# Patient Record
Sex: Male | Born: 1970
Health system: Southern US, Community
[De-identification: ages and names within clinical notes are randomized; demographics above are authoritative.]

## PROBLEM LIST (undated history)

## (undated) DIAGNOSIS — C9000 Multiple myeloma not having achieved remission: Secondary | ICD-10-CM

## (undated) DIAGNOSIS — N183 Chronic kidney disease, stage 3 unspecified: Secondary | ICD-10-CM

## (undated) DIAGNOSIS — J189 Pneumonia, unspecified organism: Secondary | ICD-10-CM

## (undated) DIAGNOSIS — I10 Essential (primary) hypertension: Secondary | ICD-10-CM

## (undated) DIAGNOSIS — G629 Polyneuropathy, unspecified: Secondary | ICD-10-CM

## (undated) HISTORY — DX: Polyneuropathy, unspecified: G62.9

## (undated) HISTORY — PX: JOINT REPLACEMENT: SHX530

## (undated) HISTORY — DX: Chronic kidney disease, stage 3 (moderate): N18.3

## (undated) HISTORY — PX: KNEE SURGERY: SHX244

## (undated) HISTORY — DX: Chronic kidney disease, stage 3 unspecified: N18.30

## (undated) HISTORY — DX: Pneumonia, unspecified organism: J18.9

## (undated) HISTORY — PX: ABDOMINAL SURGERY: SHX537

## (undated) MED FILL — Dexamethasone Sodium Phosphate Inj 100 MG/10ML: INTRAMUSCULAR | Qty: 2 | Status: AC

---

## 1997-09-13 ENCOUNTER — Encounter: Admission: RE | Admit: 1997-09-13 | Discharge: 1997-09-13 | Payer: Self-pay | Admitting: Internal Medicine

## 1997-12-06 ENCOUNTER — Encounter: Admission: RE | Admit: 1997-12-06 | Discharge: 1997-12-06 | Payer: Self-pay | Admitting: Internal Medicine

## 1997-12-20 ENCOUNTER — Encounter: Admission: RE | Admit: 1997-12-20 | Discharge: 1997-12-20 | Payer: Self-pay | Admitting: Internal Medicine

## 1997-12-27 ENCOUNTER — Ambulatory Visit (HOSPITAL_COMMUNITY): Admission: RE | Admit: 1997-12-27 | Discharge: 1997-12-27 | Payer: Self-pay | Admitting: *Deleted

## 1998-03-03 ENCOUNTER — Encounter: Admission: RE | Admit: 1998-03-03 | Discharge: 1998-03-03 | Payer: Self-pay | Admitting: Internal Medicine

## 1998-03-14 ENCOUNTER — Encounter: Admission: RE | Admit: 1998-03-14 | Discharge: 1998-04-11 | Payer: Self-pay

## 1998-03-22 ENCOUNTER — Ambulatory Visit (HOSPITAL_COMMUNITY): Admission: RE | Admit: 1998-03-22 | Discharge: 1998-03-22 | Payer: Self-pay | Admitting: Internal Medicine

## 1998-03-22 ENCOUNTER — Encounter: Admission: RE | Admit: 1998-03-22 | Discharge: 1998-03-22 | Payer: Self-pay | Admitting: Internal Medicine

## 2002-08-02 ENCOUNTER — Emergency Department (HOSPITAL_COMMUNITY): Admission: EM | Admit: 2002-08-02 | Discharge: 2002-08-02 | Payer: Self-pay | Admitting: Emergency Medicine

## 2003-04-28 ENCOUNTER — Other Ambulatory Visit: Payer: Self-pay

## 2004-05-09 ENCOUNTER — Emergency Department: Payer: Self-pay | Admitting: Unknown Physician Specialty

## 2004-05-10 ENCOUNTER — Ambulatory Visit: Payer: Self-pay | Admitting: Unknown Physician Specialty

## 2004-05-11 ENCOUNTER — Ambulatory Visit: Payer: Self-pay | Admitting: Internal Medicine

## 2004-05-31 ENCOUNTER — Ambulatory Visit: Payer: Self-pay | Admitting: Internal Medicine

## 2006-02-18 ENCOUNTER — Emergency Department: Payer: Self-pay | Admitting: Emergency Medicine

## 2006-09-18 ENCOUNTER — Other Ambulatory Visit: Payer: Self-pay

## 2006-09-18 ENCOUNTER — Emergency Department: Payer: Self-pay | Admitting: General Practice

## 2006-12-16 ENCOUNTER — Emergency Department: Payer: Self-pay | Admitting: Emergency Medicine

## 2006-12-18 ENCOUNTER — Emergency Department: Payer: Self-pay | Admitting: Emergency Medicine

## 2007-02-24 ENCOUNTER — Emergency Department: Payer: Self-pay | Admitting: Emergency Medicine

## 2007-03-04 ENCOUNTER — Emergency Department: Payer: Self-pay | Admitting: Internal Medicine

## 2007-10-12 ENCOUNTER — Emergency Department: Payer: Self-pay | Admitting: Emergency Medicine

## 2007-10-12 ENCOUNTER — Other Ambulatory Visit: Payer: Self-pay

## 2009-01-05 ENCOUNTER — Inpatient Hospital Stay: Payer: Self-pay | Admitting: Internal Medicine

## 2011-06-28 ENCOUNTER — Encounter: Payer: Self-pay | Admitting: Orthopedic Surgery

## 2011-07-22 ENCOUNTER — Encounter: Payer: Self-pay | Admitting: Orthopedic Surgery

## 2011-07-31 DIAGNOSIS — M8700 Idiopathic aseptic necrosis of unspecified bone: Secondary | ICD-10-CM | POA: Insufficient documentation

## 2011-07-31 DIAGNOSIS — Z9889 Other specified postprocedural states: Secondary | ICD-10-CM | POA: Insufficient documentation

## 2015-05-12 ENCOUNTER — Emergency Department (HOSPITAL_COMMUNITY): Payer: BLUE CROSS/BLUE SHIELD

## 2015-05-12 ENCOUNTER — Encounter (HOSPITAL_COMMUNITY): Payer: Self-pay | Admitting: Emergency Medicine

## 2015-05-12 ENCOUNTER — Emergency Department (HOSPITAL_COMMUNITY)
Admission: EM | Admit: 2015-05-12 | Discharge: 2015-05-12 | Disposition: A | Discharge status: Home / Self Care | Payer: BLUE CROSS/BLUE SHIELD | Attending: Physician Assistant | Admitting: Physician Assistant

## 2015-05-12 DIAGNOSIS — T148 Other injury of unspecified body region: Secondary | ICD-10-CM | POA: Diagnosis not present

## 2015-05-12 DIAGNOSIS — S199XXA Unspecified injury of neck, initial encounter: Secondary | ICD-10-CM | POA: Diagnosis present

## 2015-05-12 DIAGNOSIS — Y998 Other external cause status: Secondary | ICD-10-CM | POA: Insufficient documentation

## 2015-05-12 DIAGNOSIS — T148XXA Other injury of unspecified body region, initial encounter: Secondary | ICD-10-CM

## 2015-05-12 DIAGNOSIS — Y9389 Activity, other specified: Secondary | ICD-10-CM | POA: Insufficient documentation

## 2015-05-12 DIAGNOSIS — Y92410 Unspecified street and highway as the place of occurrence of the external cause: Secondary | ICD-10-CM | POA: Diagnosis not present

## 2015-05-12 DIAGNOSIS — I10 Essential (primary) hypertension: Secondary | ICD-10-CM | POA: Insufficient documentation

## 2015-05-12 DIAGNOSIS — S8992XA Unspecified injury of left lower leg, initial encounter: Secondary | ICD-10-CM | POA: Diagnosis not present

## 2015-05-12 HISTORY — DX: Essential (primary) hypertension: I10

## 2015-05-12 LAB — CBC WITH DIFFERENTIAL/PLATELET
BASOS ABS: 0 10*3/uL (ref 0.0–0.1)
Basophils Relative: 0 %
Eosinophils Absolute: 0.1 10*3/uL (ref 0.0–0.7)
Eosinophils Relative: 1 %
HEMATOCRIT: 41.9 % (ref 39.0–52.0)
Hemoglobin: 15.2 g/dL (ref 13.0–17.0)
LYMPHS PCT: 13 %
Lymphs Abs: 1.1 10*3/uL (ref 0.7–4.0)
MCH: 32.5 pg (ref 26.0–34.0)
MCHC: 36.3 g/dL — ABNORMAL HIGH (ref 30.0–36.0)
MCV: 89.7 fL (ref 78.0–100.0)
MONO ABS: 0.9 10*3/uL (ref 0.1–1.0)
MONOS PCT: 10 %
NEUTROS ABS: 6.8 10*3/uL (ref 1.7–7.7)
Neutrophils Relative %: 76 %
Platelets: 234 10*3/uL (ref 150–400)
RBC: 4.67 MIL/uL (ref 4.22–5.81)
RDW: 13.3 % (ref 11.5–15.5)
WBC: 8.9 10*3/uL (ref 4.0–10.5)

## 2015-05-12 LAB — COMPREHENSIVE METABOLIC PANEL
ALBUMIN: 4.3 g/dL (ref 3.5–5.0)
ALT: 28 U/L (ref 17–63)
ANION GAP: 12 (ref 5–15)
AST: 35 U/L (ref 15–41)
Alkaline Phosphatase: 72 U/L (ref 38–126)
BILIRUBIN TOTAL: 0.7 mg/dL (ref 0.3–1.2)
BUN: 10 mg/dL (ref 6–20)
CHLORIDE: 103 mmol/L (ref 101–111)
CO2: 24 mmol/L (ref 22–32)
Calcium: 9.7 mg/dL (ref 8.9–10.3)
Creatinine, Ser: 1.06 mg/dL (ref 0.61–1.24)
GFR calc Af Amer: >60 mL/min (ref >60)
GFR calc non Af Amer: >60 mL/min (ref >60)
GLUCOSE: 93 mg/dL (ref 65–99)
POTASSIUM: 3.2 mmol/L — AB (ref 3.5–5.1)
SODIUM: 139 mmol/L (ref 135–145)
TOTAL PROTEIN: 6.4 g/dL — AB (ref 6.5–8.1)

## 2015-05-12 MED ORDER — HYDROCODONE-ACETAMINOPHEN 5-325 MG PO TABS: 1.0000 | ORAL_TABLET | ORAL | Status: DC | PRN

## 2015-05-12 MED ORDER — FENTANYL CITRATE (PF) 100 MCG/2ML IJ SOLN
50.0000 ug | Freq: Once | INTRAMUSCULAR | Status: AC
  Administered 2015-05-12: 50 ug via INTRAVENOUS
  Filled 2015-05-12: qty 2

## 2015-05-12 MED ORDER — IOHEXOL 350 MG/ML SOLN
50.0000 mL | Freq: Once | INTRAVENOUS | Status: AC | PRN
  Administered 2015-05-12: 50 mL via INTRAVENOUS
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
## 2015-05-12 NOTE — Discharge Instructions (Signed)

## 2015-08-03 ENCOUNTER — Emergency Department
Admission: EM | Admit: 2015-08-03 | Discharge: 2015-08-03 | Disposition: A | Discharge status: Home / Self Care | Payer: BLUE CROSS/BLUE SHIELD | Attending: Emergency Medicine | Admitting: Emergency Medicine

## 2015-08-03 DIAGNOSIS — Z79899 Other long term (current) drug therapy: Secondary | ICD-10-CM | POA: Diagnosis not present

## 2015-08-03 DIAGNOSIS — I10 Essential (primary) hypertension: Secondary | ICD-10-CM | POA: Insufficient documentation

## 2015-08-03 DIAGNOSIS — J069 Acute upper respiratory infection, unspecified: Secondary | ICD-10-CM | POA: Diagnosis not present

## 2015-08-03 DIAGNOSIS — R509 Fever, unspecified: Secondary | ICD-10-CM | POA: Diagnosis present

## 2015-08-03 DIAGNOSIS — J209 Acute bronchitis, unspecified: Secondary | ICD-10-CM | POA: Diagnosis not present

## 2015-08-03 LAB — RAPID INFLUENZA A&B ANTIGENS (ARMC ONLY): INFLUENZA B (ARMC): NEGATIVE

## 2015-08-03 LAB — RAPID INFLUENZA A&B ANTIGENS: Influenza A (ARMC): NEGATIVE

## 2015-08-03 MED ORDER — AZITHROMYCIN 250 MG PO TABS: ORAL_TABLET | ORAL | Status: DC

## 2015-08-03 MED ORDER — BENZONATATE 100 MG PO CAPS: 100.0000 mg | ORAL_CAPSULE | Freq: Three times a day (TID) | ORAL | Status: DC | PRN

## 2015-08-03 MED ORDER — ALBUTEROL SULFATE HFA 108 (90 BASE) MCG/ACT IN AERS: 2.0000 | INHALATION_SPRAY | Freq: Four times a day (QID) | RESPIRATORY_TRACT | Status: DC | PRN

## 2015-08-03 NOTE — ED Notes (Signed)
Pt c/o sore throat, cough congestion, fever chills for the past 4 days

## 2015-08-03 NOTE — Discharge Instructions (Signed)
Acute Bronchitis Bronchitis is inflammation of the airways that extend from the windpipe into the lungs (bronchi). The inflammation often causes mucus to develop. This leads to a cough, which is the most common symptom of bronchitis.  In acute bronchitis, the condition usually develops suddenly and goes away over time, usually in a couple weeks. Smoking, allergies, and asthma can make bronchitis worse. Repeated episodes of bronchitis may cause further lung problems.  CAUSES Acute bronchitis is most often caused by the same virus that causes a cold. The virus can spread from person to person (contagious) through coughing, sneezing, and touching contaminated objects. SIGNS AND SYMPTOMS   Cough.   Fever.   Coughing up mucus.   Body aches.   Chest congestion.   Chills.   Shortness of breath.   Sore throat.  DIAGNOSIS  Acute bronchitis is usually diagnosed through a physical exam. Your health care provider will also ask you questions about your medical history. Tests, such as chest X-rays, are sometimes done to rule out other conditions.  TREATMENT  Acute bronchitis usually goes away in a couple weeks. Oftentimes, no medical treatment is necessary. Medicines are sometimes given for relief of fever or cough. Antibiotic medicines are usually not needed but may be prescribed in certain situations. In some cases, an inhaler may be recommended to help reduce shortness of breath and control the cough. A cool mist vaporizer may also be used to help thin bronchial secretions and make it easier to clear the chest.  HOME CARE INSTRUCTIONS  Get plenty of rest.   Drink enough fluids to keep your urine clear or pale yellow (unless you have a medical condition that requires fluid restriction). Increasing fluids may help thin your respiratory secretions (sputum) and reduce chest congestion, and it will prevent dehydration.   Take medicines only as directed by your health care provider.  If  you were prescribed an antibiotic medicine, finish it all even if you start to feel better.  Avoid smoking and secondhand smoke. Exposure to cigarette smoke or irritating chemicals will make bronchitis worse. If you are a smoker, consider using nicotine gum or skin patches to help control withdrawal symptoms. Quitting smoking will help your lungs heal faster.   Reduce the chances of another bout of acute bronchitis by washing your hands frequently, avoiding people with cold symptoms, and trying not to touch your hands to your mouth, nose, or eyes.   Keep all follow-up visits as directed by your health care provider.  SEEK MEDICAL CARE IF: Your symptoms do not improve after 1 week of treatment.  SEEK IMMEDIATE MEDICAL CARE IF:  You develop an increased fever or chills.   You have chest pain.   You have severe shortness of breath.  You have bloody sputum.   You develop dehydration.  You faint or repeatedly feel like you are going to pass out.  You develop repeated vomiting.  You develop a severe headache. MAKE SURE YOU:   Understand these instructions.  Will watch your condition.  Will get help right away if you are not doing well or get worse.   This information is not intended to replace advice given to you by your health care provider. Make sure you discuss any questions you have with your health care provider.   Document Released: 05/16/2004 Document Revised: 04/29/2014 Document Reviewed: 09/29/2012 Elsevier Interactive Patient Education 2016 [place] are being treated for a bacterial infection of the chest. You should take the antibiotic as directed  until completed. Use the cough medicine and inhaler for cough and congestion relief. You may continue to dose your Nyquil as needed. Follow-up with [facility] or your primary provider for continued symptoms. Return to the ED as needed for shortness of breath or chest pain.

## 2015-08-03 NOTE — ED Provider Notes (Signed)
[facility] Emergency Department Provider Note ____________________________________________  Time seen: 1821  I have reviewed the triage vital signs and the nursing notes.  HISTORY  Chief Complaint  URI  HPI James Gean. is a 45 y.o. male presents to the ED with complaints of fever for the last 2 days. He also notes generalized body aches, sore throat, and headache. He reports a productive cough at that time as well. He reports a Tmax of 104 over the last 2 days. He did not receive the seasonal flu vaccine. The patient denies any nausea, vomiting, or diarrhea. Has been dosing NyQuil until and Tylenol with intermittent benefit.He rates his overall discomfort at a 7/10 in triage.  Past Medical History  Diagnosis Date  . Hypertension     There are no active problems to display for this patient.   Past Surgical History  Procedure Laterality Date  . Joint replacement      right knee    Current Outpatient Rx  Name  Route  Sig  Dispense  Refill  . albuterol (PROVENTIL HFA;VENTOLIN HFA) 108 (90 Base) MCG/ACT inhaler   Inhalation   Inhale 2 puffs into the lungs every 6 (six) hours as needed for wheezing or shortness of breath.   1 Inhaler   0   . azithromycin (ZITHROMAX Z-PAK) 250 MG tablet      Take 2 tablets (500 mg) on  Day 1,  followed by 1 tablet (250 mg) once daily on Days 2 through 5.   6 each   0   . benzonatate (TESSALON PERLES) 100 MG capsule   Oral   Take 1 capsule (100 mg total) by mouth 3 (three) times daily as needed for cough (Take 1-2 per dose).   30 capsule   0   . HYDROcodone-acetaminophen (NORCO/VICODIN) 5-325 MG tablet   Oral   Take 1 tablet by mouth every 4 (four) hours as needed.   10 tablet   0    Allergies Review of patient's allergies indicates no known allergies.  No family history on file.  Social History Social History  Substance Use Topics  . Smoking status: Never Smoker   . Smokeless tobacco: None  .  Alcohol Use: No   Review of Systems  Constitutional: Positive for fever and body aches. Eyes: Negative for visual changes. ENT: Negative for sore throat. Cardiovascular: Negative for chest pain. Respiratory: Negative for shortness of breath. Positive for productive cough as above. Gastrointestinal: Negative for abdominal pain, vomiting and diarrhea. Genitourinary: Negative for dysuria. Musculoskeletal: Negative for back pain. Skin: Negative for rash. Neurological: Negative for headaches, focal weakness or numbness. ____________________________________________  PHYSICAL EXAM:  VITAL SIGNS: ED Triage Vitals  Enc Vitals Group     BP 08/03/15 1801 129/95 mmHg     Pulse Rate 08/03/15 1801 117     Resp 08/03/15 1801 18     Temp 08/03/15 1801 99.1 F (37.3 C)     Temp Source 08/03/15 1801 Oral     SpO2 08/03/15 1801 99 %     Weight 08/03/15 1801 140 lb (63.504 kg)     Height 08/03/15 1801 5\' 10"  (1.778 m)     Head Cir --      Peak Flow --      Pain Score 08/03/15 1801 7     Pain Loc --      Pain Edu? --      Excl. in [place]? --    Constitutional: Alert and oriented.  Well appearing and in no distress. Head: Normocephalic and atraumatic.      Eyes: Conjunctivae are normal. PERRL. Normal extraocular movements      Ears: Canals clear. TMs intact bilaterally.   Nose: No congestion/rhinorrhea.   Mouth/Throat: Mucous membranes are moist.   Neck: Supple. No thyromegaly. Hematological/Lymphatic/Immunological: No cervical lymphadenopathy. Cardiovascular: Normal rate, regular rhythm.  Respiratory: Normal respiratory effort. No wheezes/rales/rhonchi. Gastrointestinal: Soft and nontender. No distention, Rebound, or guarding. Musculoskeletal: Nontender with normal range of motion in all extremities.  Neurologic:  Normal gait without ataxia. Normal speech and language. No gross focal neurologic deficits are appreciated. Skin:  Skin is warm, dry and intact. No rash  noted. Psychiatric: Mood and affect are normal. Patient exhibits appropriate insight and judgment. ____________________________________________   LABS (pertinent positives/negatives) Labs Reviewed  RAPID INFLUENZA A&B ANTIGENS ([facility] [facility])  ____________________________________________  INITIAL IMPRESSION / ASSESSMENT AND PLAN / ED COURSE  Patient with acute URI which may be viral in nature. We were unable to confirm influenza on screening today. He will be discharged with a prescription for azithromycin to dose for any potential underlying  infectious lung process. He is encouraged to continue to monitor symptoms and will also be discharged with prescription for Tessalon Perles and albuterol inhaler. He is to treat fevers as appropriate and hydrate to prevent dehydration. He will follow-up with [facility] for ongoing symptom management. ____________________________________________  FINAL CLINICAL IMPRESSION(S) / ED DIAGNOSES  Final diagnoses:  URI (upper respiratory infection)  Bronchitis, acute, with bronchospasm      Melvenia Needles, PA-C 08/04/15 0103  Daymon Larsen, MD 08/06/15 1056

## 2015-08-03 NOTE — ED Notes (Addendum)
Pt c/o fever x 2days  from 101-104, generalized aches, sore throat, and headache. Pt has productive cough . Pt states he can feel it in his lungs and chest

## 2016-03-19 DIAGNOSIS — K255 Chronic or unspecified gastric ulcer with perforation: Secondary | ICD-10-CM | POA: Insufficient documentation

## 2016-03-25 ENCOUNTER — Emergency Department
Admission: EM | Admit: 2016-03-25 | Discharge: 2016-03-25 | Disposition: A | Discharge status: Home / Self Care | Payer: BLUE CROSS/BLUE SHIELD | Attending: Emergency Medicine | Admitting: Emergency Medicine

## 2016-03-25 ENCOUNTER — Encounter: Payer: Self-pay | Admitting: Emergency Medicine

## 2016-03-25 ENCOUNTER — Emergency Department: Payer: BLUE CROSS/BLUE SHIELD

## 2016-03-25 DIAGNOSIS — X58XXXA Exposure to other specified factors, initial encounter: Secondary | ICD-10-CM | POA: Diagnosis not present

## 2016-03-25 DIAGNOSIS — I10 Essential (primary) hypertension: Secondary | ICD-10-CM | POA: Diagnosis not present

## 2016-03-25 DIAGNOSIS — Y9389 Activity, other specified: Secondary | ICD-10-CM | POA: Diagnosis not present

## 2016-03-25 DIAGNOSIS — Y999 Unspecified external cause status: Secondary | ICD-10-CM | POA: Insufficient documentation

## 2016-03-25 DIAGNOSIS — S29012A Strain of muscle and tendon of back wall of thorax, initial encounter: Secondary | ICD-10-CM | POA: Diagnosis not present

## 2016-03-25 DIAGNOSIS — S29019A Strain of muscle and tendon of unspecified wall of thorax, initial encounter: Secondary | ICD-10-CM

## 2016-03-25 DIAGNOSIS — Y929 Unspecified place or not applicable: Secondary | ICD-10-CM | POA: Insufficient documentation

## 2016-03-25 DIAGNOSIS — S299XXA Unspecified injury of thorax, initial encounter: Secondary | ICD-10-CM | POA: Diagnosis present

## 2016-03-25 MED ORDER — HYDROCODONE-ACETAMINOPHEN 5-325 MG PO TABS: 1.0000 | ORAL_TABLET | ORAL | 0 refills | Status: DC | PRN

## 2016-03-25 MED ORDER — METHOCARBAMOL 500 MG PO TABS: 500.0000 mg | ORAL_TABLET | Freq: Four times a day (QID) | ORAL | 0 refills | Status: DC

## 2016-03-25 NOTE — ED Provider Notes (Signed)
[facility] Emergency Department Provider Note   ____________________________________________   First MD Initiated Contact with Patient 03/25/16 1618     (approximate)  I have reviewed the triage vital signs and the nursing notes.   HISTORY  Chief Complaint Back Pain   HPI James House. is a 45 y.o. male is here complaint of right scapular pain.Patient states that he has had upper back pain for the last 2 days. He is unaware of any particular injury but states that he was aware of muscle spasms while folding clothes. He states that his "back muscles feel tired". Patient has taken Tylenol over-the-counter without any improvement. Patient states he is unable to take any anti-inflammatories due to GI surgery. Patient denies any difficulty breathing, coughing, either, chills, nausea or vomiting. He denies any urinary symptoms or history of kidney stones. Patient currently rates his pain as 10 over 10.   Past Medical History:  Diagnosis Date  . Hypertension     There are no active problems to display for this patient.   Past Surgical History:  Procedure Laterality Date  . ABDOMINAL SURGERY    . JOINT REPLACEMENT     right knee  . KNEE SURGERY Left     Prior to Admission medications   Medication Sig Start Date End Date Taking? Authorizing Provider  albuterol (PROVENTIL HFA;VENTOLIN HFA) 108 (90 Base) MCG/ACT inhaler Inhale 2 puffs into the lungs every 6 (six) hours as needed for wheezing or shortness of breath. 08/03/15   Jenise V Bacon Menshew, PA-C  azithromycin (ZITHROMAX Z-PAK) 250 MG tablet Take 2 tablets (500 mg) on  Day 1,  followed by 1 tablet (250 mg) once daily on Days 2 through 5. 08/03/15   Jenise V Bacon Menshew, PA-C  benzonatate (TESSALON PERLES) 100 MG capsule Take 1 capsule (100 mg total) by mouth 3 (three) times daily as needed for cough (Take 1-2 per dose). 08/03/15   Jenise V Bacon Menshew, PA-C  HYDROcodone-acetaminophen  (NORCO/VICODIN) 5-325 MG tablet Take 1 tablet by mouth every 4 (four) hours as needed. 03/25/16   Johnn Hai, PA-C  methocarbamol (ROBAXIN) 500 MG tablet Take 1 tablet (500 mg total) by mouth 4 (four) times daily. 03/25/16   Johnn Hai, PA-C    Allergies Patient has no known allergies.  No family history on file.  Social History Social History  Substance Use Topics  . Smoking status: Never Smoker  . Smokeless tobacco: Never Used  . Alcohol use No    Review of Systems Constitutional: No fever/chills Eyes: No visual changes. ENT: No complaints Cardiovascular: Denies chest pain. Respiratory: Denies shortness of breath. Gastrointestinal: No abdominal pain.  No nausea, no vomiting.   Musculoskeletal: Positive right parascapular muscle tenderness. Skin: Negative for rash. Neurological: Negative for headaches, focal weakness or numbness.  10-point ROS otherwise negative.  ____________________________________________   PHYSICAL EXAM:  VITAL SIGNS: ED Triage Vitals  Enc Vitals Group     BP 03/25/16 1610 (!) 158/87     Pulse Rate 03/25/16 1610 89     Resp 03/25/16 1610 18     Temp 03/25/16 1610 98.3 F (36.8 C)     Temp Source 03/25/16 1610 Oral     SpO2 03/25/16 1610 100 %     Weight 03/25/16 1610 139 lb 12.8 oz (63.4 kg)     Height 03/25/16 1610 5\' 7"  (1.702 m)     Head Circumference --      Peak Flow --  Pain Score 03/25/16 1611 10     Pain Loc --      Pain Edu? --      Excl. in [place]? --     Constitutional: Alert and oriented. Well appearing and in no acute distress. Eyes: Conjunctivae are normal. PERRL. EOMI. Head: Atraumatic. Nose: No congestion/rhinnorhea. Neck: No stridor.  No cervical tenderness on palpation posteriorly. Cardiovascular: Normal rate, regular rhythm. Grossly normal heart sounds.  Good peripheral circulation. Respiratory: Normal respiratory effort.  No retractions. Lungs CTAB. Gastrointestinal: Soft and nontender. No distention.   No CVA tenderness. Musculoskeletal: On examination of the upper back there is no gross deformity noted. There is no tenderness on palpation of the thoracic spine. There is tenderness along the rhomboid muscle right side and parascapular muscles. Range of motion is slightly restricted secondary to discomfort. No crepitus was noted in the shoulder area. No ecchymosis, abrasions or erythema was noted. Neurologic:  Normal speech and language. No gross focal neurologic deficits are appreciated. No gait instability. Skin:  Skin is warm, dry and intact. No rash noted. Psychiatric: Mood and affect are normal. Speech and behavior are normal.  ____________________________________________   LABS (all labs ordered are listed, but only abnormal results are displayed)  Labs Reviewed - No data to display  RADIOLOGY Right shoulder x-ray per radiologist is negative. I, Johnn Hai, personally viewed and evaluated these images (plain radiographs) as part of my medical decision making, as well as reviewing the written report by the radiologist.  ____________________________________________   PROCEDURES  Procedure(s) performed: None  Procedures  Critical Care performed: No  ____________________________________________   INITIAL IMPRESSION / ASSESSMENT AND PLAN / ED COURSE  Pertinent labs & imaging results that were available during my care of the patient were reviewed by me and considered in my medical decision making (see chart for details).    Clinical Course    Patient was given prescription for Norco one every 4 hours #12 no refill and Robaxin 500 mg 4 times a day. Patient was made aware that he cannot take this medication while driving or at work. He is to follow up with his primary care doctor at [facility] if any continued problems. He is encouraged to use ice or heat to his back as needed for comfort.  ____________________________________________   FINAL CLINICAL IMPRESSION(S) / ED  DIAGNOSES  Final diagnoses:  Thoracic myofascial strain, initial encounter      NEW MEDICATIONS STARTED DURING THIS VISIT:  Discharge Medication List as of 03/25/2016  5:34 PM    START taking these medications   Details  methocarbamol (ROBAXIN) 500 MG tablet Take 1 tablet (500 mg total) by mouth 4 (four) times daily., Starting Mon 03/25/2016, Print         Note:  This document was prepared using Dragon voice recognition software and may include unintentional dictation errors.    Johnn Hai, PA-C 03/25/16 1744    Earleen Newport, MD 03/26/16 [phone]

## 2016-03-25 NOTE — Discharge Instructions (Signed)
Follow-up with your doctor at [facility] if any continued problems. Norco as needed for severe pain. Do not drive or operate machinery while taking this medication.  Do not take this medication while working. In 2 days return to work however you  may take medication at night before bed. Robaxin 500 mg 4 times a day as needed for muscle spasms. Moist heat or ice to muscles as needed for comfort.

## 2016-03-25 NOTE — ED Triage Notes (Signed)
Pt ambulatory to triage with steady gait with c/o upper back pain x 2 days. Pt denies known injury. States pain increases with exertion, states "my back muscles feel tired."

## 2016-08-08 DIAGNOSIS — N179 Acute kidney failure, unspecified: Secondary | ICD-10-CM | POA: Insufficient documentation

## 2016-08-21 DIAGNOSIS — C9000 Multiple myeloma not having achieved remission: Secondary | ICD-10-CM | POA: Insufficient documentation

## 2016-08-27 DIAGNOSIS — C9000 Multiple myeloma not having achieved remission: Secondary | ICD-10-CM | POA: Insufficient documentation

## 2016-10-15 DIAGNOSIS — T451X5A Adverse effect of antineoplastic and immunosuppressive drugs, initial encounter: Secondary | ICD-10-CM | POA: Insufficient documentation

## 2017-01-07 DIAGNOSIS — Z720 Tobacco use: Secondary | ICD-10-CM | POA: Insufficient documentation

## 2017-03-28 DIAGNOSIS — G894 Chronic pain syndrome: Secondary | ICD-10-CM | POA: Insufficient documentation

## 2017-04-18 DIAGNOSIS — G63 Polyneuropathy in diseases classified elsewhere: Secondary | ICD-10-CM | POA: Insufficient documentation

## 2017-05-29 ENCOUNTER — Emergency Department (HOSPITAL_COMMUNITY): Payer: Medicaid Other

## 2017-05-29 ENCOUNTER — Emergency Department (HOSPITAL_COMMUNITY)
Admission: EM | Admit: 2017-05-29 | Discharge: 2017-05-29 | Disposition: A | Discharge status: Home / Self Care | Payer: Medicaid Other | Attending: Emergency Medicine | Admitting: Emergency Medicine

## 2017-05-29 ENCOUNTER — Encounter (HOSPITAL_COMMUNITY): Payer: Self-pay | Admitting: Emergency Medicine

## 2017-05-29 ENCOUNTER — Other Ambulatory Visit: Payer: Self-pay

## 2017-05-29 DIAGNOSIS — Y999 Unspecified external cause status: Secondary | ICD-10-CM | POA: Diagnosis not present

## 2017-05-29 DIAGNOSIS — Y9389 Activity, other specified: Secondary | ICD-10-CM | POA: Diagnosis not present

## 2017-05-29 DIAGNOSIS — G8911 Acute pain due to trauma: Secondary | ICD-10-CM | POA: Diagnosis not present

## 2017-05-29 DIAGNOSIS — F17290 Nicotine dependence, other tobacco product, uncomplicated: Secondary | ICD-10-CM | POA: Diagnosis not present

## 2017-05-29 DIAGNOSIS — R0789 Other chest pain: Secondary | ICD-10-CM | POA: Diagnosis not present

## 2017-05-29 DIAGNOSIS — S299XXA Unspecified injury of thorax, initial encounter: Secondary | ICD-10-CM | POA: Diagnosis present

## 2017-05-29 DIAGNOSIS — R0781 Pleurodynia: Secondary | ICD-10-CM

## 2017-05-29 DIAGNOSIS — C9000 Multiple myeloma not having achieved remission: Secondary | ICD-10-CM | POA: Diagnosis not present

## 2017-05-29 DIAGNOSIS — Z79899 Other long term (current) drug therapy: Secondary | ICD-10-CM | POA: Insufficient documentation

## 2017-05-29 DIAGNOSIS — I10 Essential (primary) hypertension: Secondary | ICD-10-CM | POA: Insufficient documentation

## 2017-05-29 DIAGNOSIS — W01198A Fall on same level from slipping, tripping and stumbling with subsequent striking against other object, initial encounter: Secondary | ICD-10-CM | POA: Diagnosis not present

## 2017-05-29 DIAGNOSIS — Y92018 Other place in single-family (private) house as the place of occurrence of the external cause: Secondary | ICD-10-CM | POA: Insufficient documentation

## 2017-05-29 MED ORDER — HYDROCODONE-ACETAMINOPHEN 5-325 MG PO TABS: 1.0000 | ORAL_TABLET | Freq: Once | ORAL | Status: DC

## 2017-05-29 MED ORDER — LIDOCAINE 5 % EX PTCH: 1.0000 | MEDICATED_PATCH | CUTANEOUS | 0 refills | Status: DC

## 2017-05-29 MED ORDER — OXYCODONE-ACETAMINOPHEN 5-325 MG PO TABS
1.0000 | ORAL_TABLET | Freq: Once | ORAL | Status: AC
  Administered 2017-05-29: 1 via ORAL
  Filled 2017-05-29: qty 1

## 2017-05-29 NOTE — Discharge Instructions (Signed)
Also discussed, apply the lidocaine patch in the area of pain.  He can continue taking her pain medication or Tylenol.  Follow-up with your doctors as directed.  Return the emergency department for any fevers, chest pain, difficulty breathing, numbness/weakness or any other worsening.

## 2017-05-29 NOTE — ED Provider Notes (Signed)
[facility] EMERGENCY DEPARTMENT Provider Note   CSN: [id number] Arrival date & time: 05/29/17  1259     History   Chief Complaint Chief Complaint  Patient presents with  . Arm Pain  . Rib Injury    HPI James House See. is a 47 y.o. male past medical history of hypertension, multiple myeloma who presents for evaluation of left-sided rib after mechanical fall that occurred approximately 6 days ago.  Patient states that he was walking in his house, when he tripped over a dog kennel, causing him to fall and land on his left side.  He states that he hit his left lateral and anterior chest wall against the door.  No LOC at the time.  No preceding dizziness, chest pain.  Was able to ambulate immediately after the incident.  Patient reports that since then, he is experiencing left anterior and lateral muscular chest wall pain. He denies any chest pain. This pain is worsened with deep inspiration, palpation, coughing and range of motion movement of his left upper extremity.  Patient denies any chest pain, difficulty breathing, cough, numbness/weakness.  Atient states that he has been taking his previously prescribed OxyContin for the pain with minimal improvement.  He has not been using any other medications.  He does not been seen by his primary care doctor for evaluation of symptoms.   The history is provided by the patient.    Past Medical History:  Diagnosis Date  . Cancer ([place])   . Hypertension     There are no active problems to display for this patient.   Past Surgical History:  Procedure Laterality Date  . ABDOMINAL SURGERY    . JOINT REPLACEMENT     right knee  . KNEE SURGERY Left        Home Medications    Prior to Admission medications   Medication Sig Start Date End Date Taking? Authorizing Provider  albuterol (PROVENTIL HFA;VENTOLIN HFA) 108 (90 Base) MCG/ACT inhaler Inhale 2 puffs into the lungs every 6 (six) hours as needed for wheezing or  shortness of breath. 08/03/15   Menshew, Dannielle Karvonen, PA-C  azithromycin (ZITHROMAX Z-PAK) 250 MG tablet Take 2 tablets (500 mg) on  Day 1,  followed by 1 tablet (250 mg) once daily on Days 2 through 5. 08/03/15   Menshew, Dannielle Karvonen, PA-C  benzonatate (TESSALON PERLES) 100 MG capsule Take 1 capsule (100 mg total) by mouth 3 (three) times daily as needed for cough (Take 1-2 per dose). 08/03/15   Menshew, Dannielle Karvonen, PA-C  HYDROcodone-acetaminophen (NORCO/VICODIN) 5-325 MG tablet Take 1 tablet by mouth every 4 (four) hours as needed. 03/25/16   Letitia Neri L, PA-C  lidocaine (LIDODERM) 5 % Place 1 patch onto the skin daily. Remove & Discard patch within 12 hours or as directed by MD 05/29/17   Volanda Napoleon, PA-C  methocarbamol (ROBAXIN) 500 MG tablet Take 1 tablet (500 mg total) by mouth 4 (four) times daily. 03/25/16   Johnn Hai, PA-C    Family History No family history on file.  Social History Social History   Tobacco Use  . Smoking status: Former Research scientist (life sciences)  . Smokeless tobacco: Current User    Types: Snuff  Substance Use Topics  . Alcohol use: No  . Drug use: No     Allergies   Patient has no known allergies.   Review of Systems Review of Systems  Constitutional: Negative for fever.  Respiratory: Negative  for cough and shortness of breath.   Cardiovascular: Negative for chest pain.  Gastrointestinal: Negative for abdominal pain, nausea and vomiting.  Genitourinary: Negative for dysuria and hematuria.  Musculoskeletal:       Left lateral chest wall pain  Neurological: Negative for headaches.     Physical Exam Updated Vital Signs BP 138/82 (BP Location: Right Arm)   Pulse 84   Temp 99.2 F (37.3 C) (Oral)   Resp 14   SpO2 100%   Physical Exam  Constitutional: He is oriented to person, place, and time. He appears well-developed and well-nourished.  HENT:  Head: Normocephalic and atraumatic.  Mouth/Throat: Oropharynx is clear and moist and mucous  membranes are normal.  Eyes: Conjunctivae, EOM and lids are normal. Pupils are equal, round, and reactive to light.  Neck: Full passive range of motion without pain.  Cardiovascular: Normal rate, regular rhythm, normal heart sounds and normal pulses. Exam reveals no gallop and no friction rub.  No murmur heard. Pulses:      Radial pulses are 2+ on the right side, and 2+ on the left side.  Pulmonary/Chest: Effort normal and breath sounds normal.  There is palpation to the anterior chest wall that extends into the anterior region overlying ribs 6, 7, 8.  There is overlying ecchymosis that appears subacute in nature.  No deformity or crepitus noted.  No evidence of flail chest.  No evidence of respiratory distress.  Musculoskeletal: Normal range of motion.  No tenderness palpation to the left elbow, left wrist.  No tenderness palpation overlying the anterior aspect of left shoulder.  No tenderness to palpation of the left shoulder. No deformity or crepitus noted.  Full range of motion left shoulder intact but does report pain to the left lateral chest wall with range of motion.  No abnormalities of the right upper extremity.  Neurological: He is alert and oriented to person, place, and time.  Sensation intact along major nerve distributions of BUE Equal grip strength bilaterally.  Skin: Skin is warm and dry. Capillary refill takes less than 2 seconds.  1 x 2 cm area of subacute ecchymosis noted to the anterior aspect of the left chest at approximately ribs 6/7.  Psychiatric: He has a normal mood and affect. His speech is normal.  Nursing note and vitals reviewed.    ED Treatments / Results  Labs (all labs ordered are listed, but only abnormal results are displayed) Labs Reviewed - No data to display  EKG  EKG Interpretation None       Radiology Dg Ribs Unilateral W/chest Left  Result Date: 05/29/2017 CLINICAL DATA:  Status post fall last Friday with left rib pain. EXAM: LEFT RIBS AND  CHEST - 3+ VIEW COMPARISON:  Chest x-ray May 12, 2015 FINDINGS: There is mild old posttraumatic change in the left seventh rib. No acute fracture dislocation is identified. There is no evidence of pneumothorax or pleural effusion. Both lungs are clear. Heart size and mediastinal contours are within normal limits. IMPRESSION: No acute fracture or dislocation noted. Electronically Signed   By: Abelardo Diesel M.D.   On: 05/29/2017 14:50    Procedures Procedures (including critical care time)  Medications Ordered in ED Medications  oxyCODONE-acetaminophen (PERCOCET/ROXICET) 5-325 MG per tablet 1 tablet (1 tablet Oral Given 05/29/17 1856)     Initial Impression / Assessment and Plan / ED Course  I have reviewed the triage vital signs and the nursing notes.  Pertinent labs & imaging results that were available during  my care of the patient were reviewed by me and considered in my medical decision making (see chart for details).     47 y.o. male past mental history of hypertension, multiple myeloma who presents for evaluation of left lateral chest wall pain after mechanical fall that occurred approximately 6 days ago.  His outside evaluation. Patient is afebrile, non-toxic appearing, sitting comfortably on examination table. Vital signs reviewed and stable. Patient is neurovascularly intact.  On physical exam, patient does have tenderness overlying the left anterior chest wall overlying ribs 6, 7, and 8.  There is a small area of overlying ecchymosis but appears more subacute..  No deformity or crepitus noted.  Consider rib contusion versus fracture.  History/physical exam is not concerning for ACS etiology, CVA, pneumothorax.  Chest x-ray ordered at triage.  Given pain with left shoulder, will plan for left shoulder x-ray. Analgesics provided in the department.  Patient is currently undergoing treatment for multiple myeloma. At this time, do not feel this is a PE. Patient has tenderness at a specific  point in the left rib cage that correlates to the area of ecchymosis injury. Additionally, he denies any chest pain or difficulty breathing. Vitals shows he is not tachycardic or hypoxic. Suspect this is muscular pain related to his injury.  On reevaluation, patient does not left shoulder but reports that the pain is worsened with movement of the left shoulder.  History/physical exam is not concerning for shoulder dislocation, fracture.  Chest x-ray reviewed.  No acute fracture dislocation of the left ribs.  There is mention of an old posttraumatic change of the rib #7 with correlates to patient's area of pain.  Reviewed on the PMP database.  He received 36 oxycodone on 05/27/17.  Prior to that he had received 100 oxycodone on 05/06/17.  Further review of records shows multiple prescriptions of narcotics, including on 05/01/17, 04/16/18, 03/29/18.  Discussed results with patient.  Instructed him to take his regularly scheduled pain medications.  He is scheduled to follow-up with his regular doctors in the next 3 days.  Encouraged him to keep that appointment.  Vital signs are stable at this time.  No evidence of tachycardia or hypoxia.  Patient stable for discharge at this time. Patient had ample opportunity for questions and discussion. All patient's questions were answered with full understanding. Strict return precautions discussed. Patient expresses understanding and agreement to plan.   Final Clinical Impressions(s) / ED Diagnoses   Final diagnoses:  Rib pain on left side    ED Discharge Orders        Ordered    lidocaine (LIDODERM) 5 %  Every 24 hours     05/29/17 1830       Volanda Napoleon, PA-C 05/30/17 0216    Fatima Blank, MD 05/31/17 1123

## 2017-05-29 NOTE — ED Triage Notes (Signed)
Pt reports fall on Friday, reports injury to L ribcage and pain when using LUE.  Pt reports bruising to sternal area and L mandible.  Pt reports currently under tx for multiple myeloma.  Pt endorse pain with respiration, denies SOB, cough, CP.

## 2017-08-01 DIAGNOSIS — M792 Neuralgia and neuritis, unspecified: Secondary | ICD-10-CM | POA: Insufficient documentation

## 2017-08-11 ENCOUNTER — Encounter: Payer: Self-pay | Admitting: Emergency Medicine

## 2017-08-11 ENCOUNTER — Inpatient Hospital Stay
Admission: EM | Admit: 2017-08-11 | Discharge: 2017-08-13 | DRG: 871 | Disposition: A | Discharge status: Home / Self Care | Payer: Medicaid Other | Attending: Internal Medicine | Admitting: Internal Medicine

## 2017-08-11 ENCOUNTER — Other Ambulatory Visit: Payer: Self-pay

## 2017-08-11 ENCOUNTER — Emergency Department: Payer: Medicaid Other

## 2017-08-11 DIAGNOSIS — Z720 Tobacco use: Secondary | ICD-10-CM

## 2017-08-11 DIAGNOSIS — D696 Thrombocytopenia, unspecified: Secondary | ICD-10-CM | POA: Diagnosis not present

## 2017-08-11 DIAGNOSIS — R6520 Severe sepsis without septic shock: Secondary | ICD-10-CM

## 2017-08-11 DIAGNOSIS — N179 Acute kidney failure, unspecified: Secondary | ICD-10-CM

## 2017-08-11 DIAGNOSIS — D899 Disorder involving the immune mechanism, unspecified: Secondary | ICD-10-CM | POA: Diagnosis present

## 2017-08-11 DIAGNOSIS — Z96651 Presence of right artificial knee joint: Secondary | ICD-10-CM | POA: Diagnosis present

## 2017-08-11 DIAGNOSIS — A419 Sepsis, unspecified organism: Principal | ICD-10-CM

## 2017-08-11 DIAGNOSIS — D649 Anemia, unspecified: Secondary | ICD-10-CM | POA: Diagnosis not present

## 2017-08-11 DIAGNOSIS — C9000 Multiple myeloma not having achieved remission: Secondary | ICD-10-CM | POA: Diagnosis present

## 2017-08-11 DIAGNOSIS — E860 Dehydration: Secondary | ICD-10-CM | POA: Diagnosis present

## 2017-08-11 DIAGNOSIS — I10 Essential (primary) hypertension: Secondary | ICD-10-CM | POA: Diagnosis present

## 2017-08-11 DIAGNOSIS — Z79899 Other long term (current) drug therapy: Secondary | ICD-10-CM

## 2017-08-11 DIAGNOSIS — Z7982 Long term (current) use of aspirin: Secondary | ICD-10-CM | POA: Diagnosis not present

## 2017-08-11 DIAGNOSIS — J189 Pneumonia, unspecified organism: Secondary | ICD-10-CM | POA: Diagnosis present

## 2017-08-11 DIAGNOSIS — J188 Other pneumonia, unspecified organism: Secondary | ICD-10-CM

## 2017-08-11 DIAGNOSIS — E871 Hypo-osmolality and hyponatremia: Secondary | ICD-10-CM

## 2017-08-11 DIAGNOSIS — D849 Immunodeficiency, unspecified: Secondary | ICD-10-CM

## 2017-08-11 HISTORY — DX: Multiple myeloma not having achieved remission: C90.00

## 2017-08-11 HISTORY — DX: Sepsis, unspecified organism: A41.9

## 2017-08-11 HISTORY — DX: Sepsis, unspecified organism: R65.20

## 2017-08-11 LAB — CBC WITH DIFFERENTIAL/PLATELET
BASOS ABS: 0 10*3/uL (ref 0–0.1)
BASOS PCT: 0 %
EOS ABS: 0 10*3/uL (ref 0–0.7)
EOS PCT: 0 %
HCT: 28 % — ABNORMAL LOW (ref 40.0–52.0)
Hemoglobin: 9.9 g/dL — ABNORMAL LOW (ref 13.0–18.0)
Lymphocytes Relative: 3 %
Lymphs Abs: 0.4 10*3/uL — ABNORMAL LOW (ref 1.0–3.6)
MCH: 33.3 pg (ref 26.0–34.0)
MCHC: 35.3 g/dL (ref 32.0–36.0)
MCV: 94.2 fL (ref 80.0–100.0)
Monocytes Absolute: 1.1 10*3/uL — ABNORMAL HIGH (ref 0.2–1.0)
Monocytes Relative: 9 %
NEUTROS PCT: 88 %
Neutro Abs: 11.5 10*3/uL — ABNORMAL HIGH (ref 1.4–6.5)
PLATELETS: 193 10*3/uL (ref 150–440)
RBC: 2.97 MIL/uL — AB (ref 4.40–5.90)
RDW: 15.1 % — ABNORMAL HIGH (ref 11.5–14.5)
WBC: 13.1 10*3/uL — AB (ref 3.8–10.6)

## 2017-08-11 LAB — URINALYSIS, COMPLETE (UACMP) WITH MICROSCOPIC
BILIRUBIN URINE: NEGATIVE
Glucose, UA: NEGATIVE mg/dL
HGB URINE DIPSTICK: NEGATIVE
KETONES UR: NEGATIVE mg/dL
LEUKOCYTES UA: NEGATIVE
Nitrite: NEGATIVE
Protein, ur: NEGATIVE mg/dL
Specific Gravity, Urine: 1.006 (ref 1.005–1.030)
pH: 6 (ref 5.0–8.0)

## 2017-08-11 LAB — COMPREHENSIVE METABOLIC PANEL
ALBUMIN: 3.6 g/dL (ref 3.5–5.0)
ALT: 22 U/L (ref 17–63)
ANION GAP: 9 (ref 5–15)
AST: 21 U/L (ref 15–41)
Alkaline Phosphatase: 113 U/L (ref 38–126)
BUN: 15 mg/dL (ref 6–20)
CALCIUM: 8.7 mg/dL — AB (ref 8.9–10.3)
CO2: 26 mmol/L (ref 22–32)
CREATININE: 1.81 mg/dL — AB (ref 0.61–1.24)
Chloride: 94 mmol/L — ABNORMAL LOW (ref 101–111)
GFR calc Af Amer: 50 mL/min — ABNORMAL LOW (ref >60)
GFR calc non Af Amer: 43 mL/min — ABNORMAL LOW (ref >60)
Glucose, Bld: 112 mg/dL — ABNORMAL HIGH (ref 65–99)
POTASSIUM: 3.6 mmol/L (ref 3.5–5.1)
SODIUM: 129 mmol/L — AB (ref 135–145)
TOTAL PROTEIN: 7.3 g/dL (ref 6.5–8.1)
Total Bilirubin: 1.3 mg/dL — ABNORMAL HIGH (ref 0.3–1.2)

## 2017-08-11 LAB — LACTIC ACID, PLASMA: LACTIC ACID, VENOUS: 1.1 mmol/L (ref 0.5–1.9)

## 2017-08-11 MED ORDER — SODIUM CHLORIDE 0.9 % IV SOLN
2.0000 g | Freq: Two times a day (BID) | INTRAVENOUS | Status: DC
  Administered 2017-08-12 – 2017-08-13 (×3): 2 g via INTRAVENOUS
  Filled 2017-08-11 (×4): qty 2

## 2017-08-11 MED ORDER — SODIUM CHLORIDE 0.9 % IV BOLUS
1000.0000 mL | Freq: Once | INTRAVENOUS | Status: AC
  Administered 2017-08-11: 1000 mL via INTRAVENOUS

## 2017-08-11 MED ORDER — ACETAMINOPHEN 500 MG PO TABS
ORAL_TABLET | ORAL | Status: AC
  Filled 2017-08-11: qty 2

## 2017-08-11 MED ORDER — VANCOMYCIN HCL IN DEXTROSE 1-5 GM/200ML-% IV SOLN
1000.0000 mg | Freq: Once | INTRAVENOUS | Status: AC
  Administered 2017-08-11: 1000 mg via INTRAVENOUS
  Filled 2017-08-11: qty 200

## 2017-08-11 MED ORDER — CYCLOBENZAPRINE HCL 10 MG PO TABS
10.0000 mg | ORAL_TABLET | Freq: Once | ORAL | Status: AC
  Administered 2017-08-11: 10 mg via ORAL
  Filled 2017-08-11: qty 1

## 2017-08-11 MED ORDER — IBUPROFEN 600 MG PO TABS
600.0000 mg | ORAL_TABLET | Freq: Once | ORAL | Status: AC
  Administered 2017-08-11: 600 mg via ORAL
  Filled 2017-08-11: qty 1

## 2017-08-11 MED ORDER — SODIUM CHLORIDE 0.9 % IV SOLN
1.0000 g | Freq: Once | INTRAVENOUS | Status: AC
  Administered 2017-08-11: 1 g via INTRAVENOUS
  Filled 2017-08-11: qty 1

## 2017-08-11 MED ORDER — ACETAMINOPHEN 500 MG PO TABS
1000.0000 mg | ORAL_TABLET | Freq: Once | ORAL | Status: AC
  Administered 2017-08-11: 1000 mg via ORAL

## 2017-08-11 MED ORDER — VANCOMYCIN HCL IN DEXTROSE 750-5 MG/150ML-% IV SOLN
750.0000 mg | Freq: Two times a day (BID) | INTRAVENOUS | Status: DC
  Administered 2017-08-12: 750 mg via INTRAVENOUS
  Filled 2017-08-11 (×2): qty 150

## 2017-08-11 NOTE — ED Notes (Signed)
Pt taken to room 26 via w/c by EDT Beth to be placed on card monitor for EKG; report called to care nurse Annie Main, RN

## 2017-08-11 NOTE — ED Triage Notes (Addendum)
Patient ambulatory to triage with steady gait, without difficulty or distress noted; wife reports "I think he has taken too much medication and he's talking out of his head"; has multiple myeoloma and taking multiple pain meds; denies any recent illness; recent stem cell transplant; pt admits to taking more than rx med

## 2017-08-11 NOTE — Progress Notes (Signed)
Pharmacy Antibiotic Note  Jari Dipasquale. is a 47 y.o. male admitted on 08/11/2017 with pneumonia w/ h/o multiple myeloma s/p stem cell transplant.  Pharmacy has been consulted for vanc/cefepime dosing.  Plan: Patient was given vanc 1g and cefepime 1g IV x 1 in ED  Will f/u w/ vanc 750 mg IV q12h w/ 8 hour stack dose. Will draw vanc trough 04/24 @ 1700 prior to 4th dose. Will start cefepime 2g IV q12h per CrCl 30 - 60 ml/min.  Ke 0.04399 T1/2 15 ~ 12 hrs and decreased dose to 750 mg (actual 15 mg/kg dose 1g) Goal trough 15 - 20 mcg/mL  Height: '5\' 7"'  (170.2 cm) Weight: 150 lb (68 kg) IBW/kg (Calculated) : 66.1  Temp (24hrs), Avg:100.9 F (38.3 C), Min:98.5 F (36.9 C), Max:103.2 F (39.6 C)  Recent Labs  Lab 08/11/17 2115  WBC 13.1*  CREATININE 1.81*  LATICACIDVEN 1.1    Estimated Creatinine Clearance: 47.7 mL/min (A) (by C-G formula based on SCr of 1.81 mg/dL (H)).    No Known Allergies  Thank you for allowing pharmacy to be a part of this patient's care.  Tobie Lords, PharmD, BCPS Clinical Pharmacist 08/11/2017

## 2017-08-11 NOTE — H&P (Signed)
[facility] NAME: James House    MR#:  [id number]  DATE OF BIRTH:  [birth year]  DATE OF ADMISSION:  08/11/2017  PRIMARY CARE PHYSICIAN: Patient, No Pcp Per   REQUESTING/REFERRING PHYSICIAN: Alfred Levins, MD  CHIEF COMPLAINT:   Chief Complaint  Patient presents with  . Altered Mental Status  . Fever    HISTORY OF PRESENT ILLNESS:  James House  is a 47 y.o. male who presents with increased cough, 2 to 3 days of malaise and then fever tonight.  Here in the ED he was found to meet sepsis criteria and has pneumonia on imaging.  Patient has multiple myeloma and had transfusion about 6 months ago.  Hospitalist called for admission  PAST MEDICAL HISTORY:   Past Medical History:  Diagnosis Date  . Cancer ([place])   . Hypertension   . Multiple myeloma ([place]) 08/11/2017     PAST SURGICAL HISTORY:   Past Surgical History:  Procedure Laterality Date  . ABDOMINAL SURGERY    . JOINT REPLACEMENT     right knee  . KNEE SURGERY Left      SOCIAL HISTORY:   Social History   Tobacco Use  . Smoking status: Former Research scientist (life sciences)  . Smokeless tobacco: Current User    Types: Snuff  Substance Use Topics  . Alcohol use: No     FAMILY HISTORY:  Family history reviewed and is noncontributory   DRUG ALLERGIES:  No Known Allergies  MEDICATIONS AT HOME:   Prior to Admission medications   Medication Sig Start Date End Date Taking? Authorizing Provider  acyclovir (ZOVIRAX) 200 MG capsule Take 400 mg by mouth 2 (two) times daily.   Yes [provider]  albuterol (PROVENTIL HFA;VENTOLIN HFA) 108 (90 Base) MCG/ACT inhaler Inhale 2 puffs into the lungs every 6 (six) hours as needed for wheezing or shortness of breath. 08/03/15  Yes Menshew, Dannielle Karvonen, PA-C  aspirin EC 81 MG tablet Take 81 mg by mouth daily.   Yes [provider]  buPROPion (WELLBUTRIN XL) 300 MG 24 hr tablet Take 300 mg by mouth daily.   Yes [provider]  DULoxetine (CYMBALTA) 60 MG capsule Take 60 mg by mouth daily.   Yes [provider]  escitalopram (LEXAPRO) 10 MG tablet Take 10 mg by mouth daily.   Yes [provider]  gabapentin (NEURONTIN) 300 MG capsule Take 600 mg by mouth 3 (three) times daily.   Yes [provider]  lenalidomide (REVLIMID) 10 MG capsule Take 10 mg by mouth daily.   Yes [provider]  oxyCODONE (OXY IR/ROXICODONE) 5 MG immediate release tablet Take 5 mg by mouth daily as needed for severe pain or breakthrough pain.   Yes [provider]  oxyCODONE ER (XTAMPZA ER) 13.5 MG C12A Take 1 capsule by mouth 2 (two) times daily.   Yes [provider]  pantoprazole (PROTONIX) 40 MG tablet Take 40 mg by mouth daily.   Yes [provider]  pregabalin (LYRICA) 150 MG capsule Take 150 mg by mouth 2 (two) times daily.   Yes [provider]  prochlorperazine (COMPAZINE) 10 MG tablet Take 10 mg by mouth every 6 (six) hours as needed for nausea or vomiting.   Yes [provider]  tiZANidine (ZANAFLEX) 4 MG tablet Take 4 mg by mouth every 8 (eight) hours as needed for muscle spasms.   Yes [provider]  traZODone (DESYREL) 50 MG tablet  Take 50 mg by mouth at bedtime as needed for sleep.   Yes [provider]  Vitamin D, Ergocalciferol, (DRISDOL) 50000 units CAPS capsule Take 50,000 Units by mouth every 7 (seven) days.   Yes [provider]  azithromycin (ZITHROMAX Z-PAK) 250 MG tablet Take 2 tablets (500 mg) on  Day 1,  followed by 1 tablet (250 mg) once daily on Days 2 through 5. Patient not taking: Reported on 08/11/2017 08/03/15   Menshew, Dannielle Karvonen, PA-C  benzonatate (TESSALON PERLES) 100 MG capsule Take 1 capsule (100 mg total) by mouth 3 (three) times daily as needed for cough (Take 1-2 per dose). Patient not taking: Reported on 08/11/2017 08/03/15   Menshew, Dannielle Karvonen, PA-C  HYDROcodone-acetaminophen  (NORCO/VICODIN) 5-325 MG tablet Take 1 tablet by mouth every 4 (four) hours as needed. Patient not taking: Reported on 08/11/2017 03/25/16   Letitia Neri L, PA-C  lidocaine (LIDODERM) 5 % Place 1 patch onto the skin daily. Remove & Discard patch within 12 hours or as directed by MD Patient not taking: Reported on 08/11/2017 05/29/17   Volanda Napoleon, PA-C  methocarbamol (ROBAXIN) 500 MG tablet Take 1 tablet (500 mg total) by mouth 4 (four) times daily. Patient not taking: Reported on 08/11/2017 03/25/16   Johnn Hai, PA-C    REVIEW OF SYSTEMS:  Review of Systems  Constitutional: Positive for fever and malaise/fatigue. Negative for chills and weight loss.  HENT: Negative for ear pain, hearing loss and tinnitus.   Eyes: Negative for blurred vision, double vision, pain and redness.  Respiratory: Positive for cough. Negative for hemoptysis and shortness of breath.   Cardiovascular: Negative for chest pain, palpitations, orthopnea and leg swelling.  Gastrointestinal: Negative for abdominal pain, constipation, diarrhea, nausea and vomiting.  Genitourinary: Negative for dysuria, frequency and hematuria.  Musculoskeletal: Negative for back pain, joint pain and neck pain.  Skin:       No acne, rash, or lesions  Neurological: Negative for dizziness, tremors, focal weakness and weakness.  Endo/Heme/Allergies: Negative for polydipsia. Does not bruise/bleed easily.  Psychiatric/Behavioral: Negative for depression. The patient is not nervous/anxious and does not have insomnia.      VITAL SIGNS:   Vitals:   08/11/17 2027 08/11/17 2036 08/11/17 2130  BP: 121/75  110/65  Pulse: (!) 119  (!) 119  Resp: 18  19  Temp: (!) 103.2 F (39.6 C)    TempSrc: Oral    SpO2: 99%  97%  Weight:  68 kg (150 lb)   Height:  _0  (1.702 m)    Wt Readings from Last 3 Encounters:  08/11/17 68 kg (150 lb)  03/25/16 63.4 kg (139 lb 12.8 oz)  08/03/15 63.5 kg (140 lb)    PHYSICAL EXAMINATION:  Physical  Exam  Vitals reviewed. Constitutional: He is oriented to person, place, and time. He appears well-developed and well-nourished. No distress.  HENT:  Head: Normocephalic and atraumatic.  Dry mucous membranes  Eyes: Pupils are equal, round, and reactive to light. Conjunctivae and EOM are normal. No scleral icterus.  Neck: Normal range of motion. Neck supple. No JVD present. No thyromegaly present.  Cardiovascular: Normal rate, regular rhythm and intact distal pulses. Exam reveals no gallop and no friction rub.  No murmur heard. Respiratory: Effort normal. No respiratory distress. He has no wheezes. He has no rales.  Right-sided coarse breath sounds  GI: Soft. Bowel sounds are normal. He exhibits no distension. There is no tenderness.  Musculoskeletal: Normal range  of motion. He exhibits no edema.  No arthritis, no gout  Lymphadenopathy:    He has no cervical adenopathy.  Neurological: He is alert and oriented to person, place, and time. No cranial nerve deficit.  No dysarthria, no aphasia  Skin: Skin is warm and dry. No rash noted. No erythema.  Psychiatric: He has a normal mood and affect. His behavior is normal. Judgment and thought content normal.    LABORATORY PANEL:   CBC Recent Labs  Lab 08/11/17 2115  WBC 13.1*  HGB 9.9*  HCT 28.0*  PLT 193   ------------------------------------------------------------------------------------------------------------------  Chemistries  Recent Labs  Lab 08/11/17 2115  NA 129*  K 3.6  CL 94*  CO2 26  GLUCOSE 112*  BUN 15  CREATININE 1.81*  CALCIUM 8.7*  AST 21  ALT 22  ALKPHOS 113  BILITOT 1.3*   ------------------------------------------------------------------------------------------------------------------  Cardiac Enzymes No results for input(s): TROPONINI in the last 168 hours. ------------------------------------------------------------------------------------------------------------------  RADIOLOGY:  Dg Chest  Port 1 View  Result Date: 08/11/2017 CLINICAL DATA:  Febrile patient with history of multiple myeloma post recent stem cell transplant. EXAM: PORTABLE CHEST 1 VIEW COMPARISON:  05/29/2017 FINDINGS: Cardiomediastinal silhouette is normal. Mediastinal contours appear intact. There is no evidence of pleural effusion or pneumothorax. Vague patchy areas of airspace consolidation in the right mid lung field. Osseous structures are without acute abnormality. Soft tissues are grossly normal. IMPRESSION: Interval development of vague patchy areas of airspace consolidation the right mid lung field, concerning for early developing multifocal pneumonia. Electronically Signed   By: Fidela Salisbury M.D.   On: 08/11/2017 21:10    EKG:   Orders placed or performed during the hospital encounter of 08/11/17  . ED EKG 12-Lead  . ED EKG 12-Lead  . EKG 12-Lead  . EKG 12-Lead    IMPRESSION AND PLAN:  Principal Problem:   Severe sepsis (HCC) -IV antibiotics started, lactic acid within normal limits, blood pressure stable, sepsis due to pneumonia, cultures sent Active Problems:   Multifocal pneumonia -IV antibiotics, PRN supportive treatment   AKI (acute kidney injury) ([place]) -IV fluids, avoid nephrotoxins and monitor for improvement   Hyponatremia -IV fluids as above, monitor for expected improvement   Multiple myeloma ([place]) -continue home meds, oncology consult for any further recommendations  Chart review performed and case discussed with ED provider. Labs, imaging and/or ECG reviewed by provider and discussed with patient/family. Management plans discussed with the patient and/or family.  DVT PROPHYLAXIS: SubQ heparin  GI PROPHYLAXIS: None  ADMISSION STATUS: Inpatient  CODE STATUS: Full  TOTAL TIME TAKING CARE OF THIS PATIENT: 45 minutes.   James House 08/11/2017, 10:40 PM  [employer]  [phone]  CC: Primary care physician; Patient, No Pcp Per  Note:   This document was prepared using Dragon voice recognition software and may include unintentional dictation errors.

## 2017-08-11 NOTE — ED Provider Notes (Signed)
[facility] Emergency Department Provider Note  ____________________________________________  Time seen: Approximately 8:55 PM  I have reviewed the triage vital signs and the nursing notes.   HISTORY  Chief Complaint Altered Mental Status and Fever   HPI James House. is a 47 y.o. male with a h/o multiple myeloma, stem cell transplant who presents for evaluation of fever.  Patient reports feeling unwell since earlier today.  Was working all day and started to feel dizzy and weak. Symptoms were moderate in intensity and constant.  Also felt like he was hot but did not check his temperature.  No cough or URI symptoms, no chest pain or shortness of breath, no neck stiffness, no sore throat, no rash, no abdominal pain, no nausea, no vomiting, no diarrhea, no dysuria.  Patient does not have a port.  Patient is not actively on any immunosuppressive therapy.  He [facility].  Past Medical History:  Diagnosis Date  . Cancer ([place])   . Hypertension     There are no active problems to display for this patient.   Past Surgical History:  Procedure Laterality Date  . ABDOMINAL SURGERY    . JOINT REPLACEMENT     right knee  . KNEE SURGERY Left     Prior to Admission medications   Medication Sig Start Date End Date Taking? Authorizing Provider  albuterol (PROVENTIL HFA;VENTOLIN HFA) 108 (90 Base) MCG/ACT inhaler Inhale 2 puffs into the lungs every 6 (six) hours as needed for wheezing or shortness of breath. 08/03/15   Menshew, Dannielle Karvonen, PA-C  azithromycin (ZITHROMAX Z-PAK) 250 MG tablet Take 2 tablets (500 mg) on  Day 1,  followed by 1 tablet (250 mg) once daily on Days 2 through 5. 08/03/15   Menshew, Dannielle Karvonen, PA-C  benzonatate (TESSALON PERLES) 100 MG capsule Take 1 capsule (100 mg total) by mouth 3 (three) times daily as needed for cough (Take 1-2 per dose). 08/03/15   Menshew, Dannielle Karvonen, PA-C  HYDROcodone-acetaminophen (NORCO/VICODIN)  5-325 MG tablet Take 1 tablet by mouth every 4 (four) hours as needed. 03/25/16   Letitia Neri L, PA-C  lidocaine (LIDODERM) 5 % Place 1 patch onto the skin daily. Remove & Discard patch within 12 hours or as directed by MD 05/29/17   Volanda Napoleon, PA-C  methocarbamol (ROBAXIN) 500 MG tablet Take 1 tablet (500 mg total) by mouth 4 (four) times daily. 03/25/16   Johnn Hai, PA-C    Allergies Patient has no known allergies.  No family history on file.  Social History Social History   Tobacco Use  . Smoking status: Former Research scientist (life sciences)  . Smokeless tobacco: Current User    Types: Snuff  Substance Use Topics  . Alcohol use: No  . Drug use: No    Review of Systems  Constitutional: + fever, dizziness Eyes: Negative for visual changes. ENT: Negative for sore throat. Neck: No neck pain  Cardiovascular: Negative for chest pain. Respiratory: Negative for shortness of breath. Gastrointestinal: Negative for abdominal pain, vomiting or diarrhea. Genitourinary: Negative for dysuria. Musculoskeletal: Negative for back pain. Skin: Negative for rash. Neurological: Negative for headaches, weakness or numbness. Psych: No SI or HI  ____________________________________________   PHYSICAL EXAM:  VITAL SIGNS: ED Triage Vitals  Enc Vitals Group     BP 08/11/17 2027 121/75     Pulse Rate 08/11/17 2027 (!) 119     Resp 08/11/17 2027 18     Temp  08/11/17 2027 (!) 103.2 F (39.6 C)     Temp Source 08/11/17 2027 Oral     SpO2 08/11/17 2027 99 %     Weight 08/11/17 2036 150 lb (68 kg)     Height 08/11/17 2036 '5\' 7"'  (1.702 m)     Head Circumference --      Peak Flow --      Pain Score 08/11/17 2035 7     Pain Loc --      Pain Edu? --      Excl. in [place]? --     Constitutional: Alert and oriented. Well appearing and in no apparent distress. HEENT:      Head: Normocephalic and atraumatic.         Eyes: Conjunctivae are normal. Sclera is non-icteric.       Mouth/Throat: Mucous  membranes are moist.  Oropharynx is clear      Neck: Supple with no signs of meningismus. Cardiovascular: Tachycardic with regular rhythm. No murmurs, gallops, or rubs. 2+ symmetrical distal pulses are present in all extremities. No JVD. Respiratory: Normal respiratory effort. Lungs are clear to auscultation bilaterally. No wheezes, crackles, or rhonchi.  Gastrointestinal: Soft, non tender, and non distended with positive bowel sounds. No rebound or guarding. Genitourinary: No CVA tenderness. Musculoskeletal: Nontender with normal range of motion in all extremities. No edema, cyanosis, or erythema of extremities. Neurologic: Normal speech and language. Face is symmetric. Moving all extremities. No gross focal neurologic deficits are appreciated. Skin: Skin is warm, dry and intact. No rash noted. Psychiatric: Mood and affect are normal. Speech and behavior are normal.  ____________________________________________   LABS (all labs ordered are listed, but only abnormal results are displayed)  Labs Reviewed  COMPREHENSIVE METABOLIC PANEL - Abnormal; Notable for the following components:      Result Value   Sodium 129 (*)    Chloride 94 (*)    Glucose, Bld 112 (*)    Creatinine, Ser 1.81 (*)    Calcium 8.7 (*)    Total Bilirubin 1.3 (*)    GFR calc non Af Amer 43 (*)    GFR calc Af Amer 50 (*)    All other components within normal limits  CBC WITH DIFFERENTIAL/PLATELET - Abnormal; Notable for the following components:   WBC 13.1 (*)    RBC 2.97 (*)    Hemoglobin 9.9 (*)    HCT 28.0 (*)    RDW 15.1 (*)    Neutro Abs 11.5 (*)    Lymphs Abs 0.4 (*)    Monocytes Absolute 1.1 (*)    All other components within normal limits  CULTURE, BLOOD (ROUTINE X 2)  CULTURE, BLOOD (ROUTINE X 2)  URINE CULTURE  LACTIC ACID, PLASMA  LACTIC ACID, PLASMA  URINALYSIS, COMPLETE (UACMP) WITH MICROSCOPIC   ____________________________________________  EKG  ED ECG REPORT I, Rudene Re, the  attending physician, personally viewed and interpreted this ECG.  Sinus tachycardia, rate of 125, normal intervals, normal axis, no ST elevations or depressions. ____________________________________________  RADIOLOGY  I have personally reviewed the images performed during this visit and I agree with the Radiologist's read.   Interpretation by Radiologist:  Dg Chest Port 1 View  Result Date: 08/11/2017 CLINICAL DATA:  Febrile patient with history of multiple myeloma post recent stem cell transplant. EXAM: PORTABLE CHEST 1 VIEW COMPARISON:  05/29/2017 FINDINGS: Cardiomediastinal silhouette is normal. Mediastinal contours appear intact. There is no evidence of pleural effusion or pneumothorax. Vague patchy areas of airspace consolidation in the  right mid lung field. Osseous structures are without acute abnormality. Soft tissues are grossly normal. IMPRESSION: Interval development of vague patchy areas of airspace consolidation the right mid lung field, concerning for early developing multifocal pneumonia. Electronically Signed   By: Fidela Salisbury M.D.   On: 08/11/2017 21:10      ____________________________________________   PROCEDURES  Procedure(s) performed: None Procedures Critical Care performed: yes  CRITICAL CARE Performed by: Rudene Re  ?  Total critical care time: 35 min  Critical care time was exclusive of separately billable procedures and treating other patients.  Critical care was necessary to treat or prevent imminent or life-threatening deterioration.  Critical care was time spent personally by me on the following activities: development of treatment plan with patient and/or surrogate as well as nursing, discussions with consultants, evaluation of patient's response to treatment, examination of patient, obtaining history from patient or surrogate, ordering and performing treatments and interventions, ordering and review of laboratory studies, ordering  and review of radiographic studies, pulse oximetry and re-evaluation of patient's condition.  ____________________________________________   INITIAL IMPRESSION / ASSESSMENT AND PLAN / ED COURSE   47 y.o. male with a h/o multiple myeloma, stem cell transplant who presents for evaluation of fever and dizziness.  Patient on arrival meet sepsis criteria especially with a history of multiple myeloma and stem cell transplant.  Pulse of 119, fever of 103.90F , patient is normotensive.  No localizing symptoms based on history and exam. No signs or symptoms of meningitis. CXR, CBC, CMP, lactic, UA, cultures pending. Given tylenol, IVF, cefepime and vancomycin. Plan for admission    _________________________ 9:54 PM on 08/11/2017 ----------------------------------------- Chest x-ray concerning for early developing multifocal pneumonia, white count is elevated at 13.1. Labs also showing AKI. Lactic acid negative. Patient to be admitted to Hospitalist service.    As part of my medical decision making, I reviewed the following data within the [place] notes reviewed and incorporated, Labs reviewed , Radiograph reviewed , Discussed with admitting physician , Notes from prior ED visits and [place] Controlled Substance Database    Pertinent labs & imaging results that were available during my care of the patient were reviewed by me and considered in my medical decision making (see chart for details).    ____________________________________________   FINAL CLINICAL IMPRESSION(S) / ED DIAGNOSES  Final diagnoses:  Sepsis, due to unspecified organism ([place])  Immunosuppressed status ([place])  Multifocal pneumonia  AKI (acute kidney injury) ([place])  Hyponatremia      NEW MEDICATIONS STARTED DURING THIS VISIT:  ED Discharge Orders    None       Note:  This document was prepared using Dragon voice recognition software and may include unintentional dictation errors.      Rudene Re, MD 08/11/17 2156

## 2017-08-11 NOTE — Progress Notes (Signed)
CODE SEPSIS - PHARMACY COMMUNICATION  **Broad Spectrum Antibiotics should be administered within 1 hour of Sepsis diagnosis**  Time Code Sepsis Called/Page Received:  4/22 @ 21:00   Antibiotics Ordered: Vancomycin , Cefepime   Time of 1st antibiotic administration: 4/22 @ 21:18   Additional action taken by pharmacy: none   If necessary, Name of Provider/Nurse Contacted: none      Kaidence Sant D ,PharmD Clinical Pharmacist  08/11/2017  10:16 PM

## 2017-08-12 ENCOUNTER — Other Ambulatory Visit: Payer: Self-pay

## 2017-08-12 ENCOUNTER — Telehealth: Payer: Self-pay

## 2017-08-12 ENCOUNTER — Telehealth: Payer: Self-pay | Admitting: *Deleted

## 2017-08-12 DIAGNOSIS — E871 Hypo-osmolality and hyponatremia: Secondary | ICD-10-CM

## 2017-08-12 DIAGNOSIS — D696 Thrombocytopenia, unspecified: Secondary | ICD-10-CM

## 2017-08-12 DIAGNOSIS — C9000 Multiple myeloma not having achieved remission: Secondary | ICD-10-CM

## 2017-08-12 DIAGNOSIS — D649 Anemia, unspecified: Secondary | ICD-10-CM

## 2017-08-12 DIAGNOSIS — A419 Sepsis, unspecified organism: Principal | ICD-10-CM

## 2017-08-12 DIAGNOSIS — J189 Pneumonia, unspecified organism: Secondary | ICD-10-CM

## 2017-08-12 DIAGNOSIS — N179 Acute kidney failure, unspecified: Secondary | ICD-10-CM

## 2017-08-12 LAB — CBC
HCT: 25.7 % — ABNORMAL LOW (ref 40.0–52.0)
Hemoglobin: 8.9 g/dL — ABNORMAL LOW (ref 13.0–18.0)
MCH: 33.2 pg (ref 26.0–34.0)
MCHC: 34.7 g/dL (ref 32.0–36.0)
MCV: 95.7 fL (ref 80.0–100.0)
PLATELETS: 134 10*3/uL — AB (ref 150–440)
RBC: 2.69 MIL/uL — ABNORMAL LOW (ref 4.40–5.90)
RDW: 15.5 % — AB (ref 11.5–14.5)
WBC: 8.9 10*3/uL (ref 3.8–10.6)

## 2017-08-12 LAB — BASIC METABOLIC PANEL
Anion gap: 5 (ref 5–15)
BUN: 14 mg/dL (ref 6–20)
CHLORIDE: 103 mmol/L (ref 101–111)
CO2: 24 mmol/L (ref 22–32)
CREATININE: 1.66 mg/dL — AB (ref 0.61–1.24)
Calcium: 8.1 mg/dL — ABNORMAL LOW (ref 8.9–10.3)
GFR calc Af Amer: 56 mL/min — ABNORMAL LOW (ref >60)
GFR calc non Af Amer: 48 mL/min — ABNORMAL LOW (ref >60)
Glucose, Bld: 100 mg/dL — ABNORMAL HIGH (ref 65–99)
Potassium: 3.8 mmol/L (ref 3.5–5.1)
Sodium: 132 mmol/L — ABNORMAL LOW (ref 135–145)

## 2017-08-12 LAB — PROCALCITONIN: Procalcitonin: 1.13 ng/mL

## 2017-08-12 LAB — MRSA PCR SCREENING: MRSA by PCR: NEGATIVE

## 2017-08-12 MED ORDER — TRAMADOL HCL 50 MG PO TABS
50.0000 mg | ORAL_TABLET | Freq: Four times a day (QID) | ORAL | Status: DC | PRN
  Administered 2017-08-12 (×2): 50 mg via ORAL
  Filled 2017-08-12 (×2): qty 1

## 2017-08-12 MED ORDER — ONDANSETRON HCL 4 MG PO TABS: 4.0000 mg | ORAL_TABLET | Freq: Four times a day (QID) | ORAL | Status: DC | PRN

## 2017-08-12 MED ORDER — LABETALOL HCL 5 MG/ML IV SOLN: 10.0000 mg | INTRAVENOUS | Status: DC | PRN

## 2017-08-12 MED ORDER — ACETAMINOPHEN 325 MG PO TABS: 650.0000 mg | ORAL_TABLET | Freq: Four times a day (QID) | ORAL | Status: DC | PRN

## 2017-08-12 MED ORDER — IPRATROPIUM-ALBUTEROL 0.5-2.5 (3) MG/3ML IN SOLN: 3.0000 mL | RESPIRATORY_TRACT | Status: DC | PRN

## 2017-08-12 MED ORDER — PANTOPRAZOLE SODIUM 40 MG PO TBEC
40.0000 mg | DELAYED_RELEASE_TABLET | Freq: Every day | ORAL | Status: DC
  Administered 2017-08-12 – 2017-08-13 (×2): 40 mg via ORAL
  Filled 2017-08-12 (×2): qty 1

## 2017-08-12 MED ORDER — BUPROPION HCL ER (XL) 300 MG PO TB24
300.0000 mg | ORAL_TABLET | Freq: Every day | ORAL | Status: DC
  Administered 2017-08-12 – 2017-08-13 (×2): 300 mg via ORAL
  Filled 2017-08-12 (×2): qty 1

## 2017-08-12 MED ORDER — ENOXAPARIN SODIUM 40 MG/0.4ML ~~LOC~~ SOLN
40.0000 mg | SUBCUTANEOUS | Status: DC
  Administered 2017-08-12 (×2): 40 mg via SUBCUTANEOUS
  Filled 2017-08-12 (×2): qty 0.4

## 2017-08-12 MED ORDER — GUAIFENESIN-DM 100-10 MG/5ML PO SYRP
5.0000 mL | ORAL_SOLUTION | ORAL | Status: DC | PRN
  Filled 2017-08-12: qty 5

## 2017-08-12 MED ORDER — ESCITALOPRAM OXALATE 10 MG PO TABS
10.0000 mg | ORAL_TABLET | Freq: Every day | ORAL | Status: DC
  Administered 2017-08-12 – 2017-08-13 (×2): 10 mg via ORAL
  Filled 2017-08-12 (×2): qty 1

## 2017-08-12 MED ORDER — GABAPENTIN 300 MG PO CAPS
600.0000 mg | ORAL_CAPSULE | Freq: Three times a day (TID) | ORAL | Status: DC
  Administered 2017-08-12 – 2017-08-13 (×4): 600 mg via ORAL
  Filled 2017-08-12 (×4): qty 2

## 2017-08-12 MED ORDER — DULOXETINE HCL 30 MG PO CPEP
60.0000 mg | ORAL_CAPSULE | Freq: Every day | ORAL | Status: DC
  Administered 2017-08-12 – 2017-08-13 (×2): 60 mg via ORAL
  Filled 2017-08-12 (×2): qty 2

## 2017-08-12 MED ORDER — TIZANIDINE HCL 4 MG PO TABS
4.0000 mg | ORAL_TABLET | Freq: Three times a day (TID) | ORAL | Status: DC | PRN
  Filled 2017-08-12: qty 1

## 2017-08-12 MED ORDER — ONDANSETRON HCL 4 MG/2ML IJ SOLN: 4.0000 mg | Freq: Four times a day (QID) | INTRAMUSCULAR | Status: DC | PRN

## 2017-08-12 MED ORDER — ACETAMINOPHEN 650 MG RE SUPP: 650.0000 mg | Freq: Four times a day (QID) | RECTAL | Status: DC | PRN

## 2017-08-12 MED ORDER — OXYCODONE HCL 5 MG PO TABS
5.0000 mg | ORAL_TABLET | Freq: Every day | ORAL | Status: DC | PRN
  Administered 2017-08-12 – 2017-08-13 (×2): 5 mg via ORAL
  Filled 2017-08-12 (×2): qty 1

## 2017-08-12 MED ORDER — BENZONATATE 100 MG PO CAPS: 100.0000 mg | ORAL_CAPSULE | Freq: Three times a day (TID) | ORAL | Status: DC | PRN

## 2017-08-12 MED ORDER — SENNOSIDES-DOCUSATE SODIUM 8.6-50 MG PO TABS
1.0000 | ORAL_TABLET | Freq: Every day | ORAL | Status: DC
  Administered 2017-08-12: 22:00:00 1 via ORAL
  Filled 2017-08-12: qty 1

## 2017-08-12 MED ORDER — OXYCODONE HCL ER 10 MG PO T12A
10.0000 mg | EXTENDED_RELEASE_TABLET | Freq: Two times a day (BID) | ORAL | Status: DC
  Administered 2017-08-12 – 2017-08-13 (×3): 10 mg via ORAL
  Filled 2017-08-12 (×3): qty 1

## 2017-08-12 MED ORDER — TRAZODONE HCL 50 MG PO TABS
50.0000 mg | ORAL_TABLET | Freq: Every evening | ORAL | Status: DC | PRN
  Administered 2017-08-12: 22:00:00 50 mg via ORAL
  Filled 2017-08-12: qty 1

## 2017-08-12 MED ORDER — OXYCODONE ER 13.5 MG PO C12A: 1.0000 | EXTENDED_RELEASE_CAPSULE | Freq: Two times a day (BID) | ORAL | Status: DC

## 2017-08-12 MED ORDER — PREGABALIN 75 MG PO CAPS
150.0000 mg | ORAL_CAPSULE | Freq: Two times a day (BID) | ORAL | Status: DC
  Administered 2017-08-12 – 2017-08-13 (×4): 150 mg via ORAL
  Filled 2017-08-12 (×4): qty 2

## 2017-08-12 MED ORDER — ACYCLOVIR 200 MG PO CAPS
400.0000 mg | ORAL_CAPSULE | Freq: Two times a day (BID) | ORAL | Status: DC
  Administered 2017-08-12 – 2017-08-13 (×3): 400 mg via ORAL
  Filled 2017-08-12 (×5): qty 2

## 2017-08-12 MED ORDER — ASPIRIN EC 81 MG PO TBEC
81.0000 mg | DELAYED_RELEASE_TABLET | Freq: Every day | ORAL | Status: DC
  Administered 2017-08-12 – 2017-08-13 (×2): 81 mg via ORAL
  Filled 2017-08-12 (×2): qty 1

## 2017-08-12 MED ORDER — LENALIDOMIDE 10 MG PO CAPS
10.0000 mg | ORAL_CAPSULE | Freq: Every day | ORAL | Status: DC
  Administered 2017-08-12: 10 mg via ORAL
  Filled 2017-08-12: qty 1

## 2017-08-12 NOTE — Progress Notes (Signed)
Collected medications from patient per pharmacist request. Counted controlled substances with Dorna Bloom, RN, and patient verified count of these via his signature. Medications were taken to pharmacy for safe-keeping.

## 2017-08-12 NOTE — Progress Notes (Signed)
[facility] NAME: James House    MR#:  [id number]  DATE OF BIRTH:  03/29/1971  SUBJECTIVE:  Patient seen and evaluated today No new episodes of fevers Tolerating diet okay Has generalized weakness Has cough  REVIEW OF SYSTEMS:    ROS  CONSTITUTIONAL: No documented fever. Has fatigue, weakness. No weight gain, no weight loss.  EYES: No blurry or double vision.  ENT: No tinnitus. No postnasal drip. No redness of the oropharynx.  RESPIRATORY: Has cough, no wheeze, no hemoptysis. No dyspnea.  CARDIOVASCULAR: No chest pain. No orthopnea. No palpitations. No syncope.  GASTROINTESTINAL: No nausea, no vomiting or diarrhea. No abdominal pain. No melena or hematochezia.  GENITOURINARY: No dysuria or hematuria.  ENDOCRINE: No polyuria or nocturia. No heat or cold intolerance.  HEMATOLOGY: No anemia. No bruising. No bleeding.  INTEGUMENTARY: No rashes. No lesions.  MUSCULOSKELETAL: No arthritis. No swelling. No gout.  NEUROLOGIC: No numbness, tingling, or ataxia. No seizure-type activity.  PSYCHIATRIC: No anxiety. No insomnia. No ADD.   DRUG ALLERGIES:  No Known Allergies  VITALS:  Blood pressure 100/72, pulse 72, temperature 97.6 F (36.4 C), temperature source Oral, resp. rate 19, height _0  (1.702 m), weight 68.9 kg (152 lb), SpO2 100 %.  PHYSICAL EXAMINATION:   Physical Exam  GENERAL:  47 y.o.-year-old patient lying in the bed with no acute distress.  EYES: Pupils equal, round, reactive to light and accommodation. No scleral icterus. Extraocular muscles intact.  HEENT: Head atraumatic, normocephalic. Oropharynx and nasopharynx clear.  NECK:  Supple, no jugular venous distention. No thyroid enlargement, no tenderness.  LUNGS: Decreased breath sounds bilaterally, bilateral rales heard. No use of accessory muscles of respiration.  CARDIOVASCULAR: S1, S2 normal. No murmurs, rubs, or gallops.  ABDOMEN: Soft, nontender,  nondistended. Bowel sounds present. No organomegaly or mass.  EXTREMITIES: No cyanosis, clubbing or edema b/l.    NEUROLOGIC: Cranial nerves II through XII are intact. No focal Motor or sensory deficits b/l.   PSYCHIATRIC: The patient is alert and oriented x 3.  SKIN: No obvious rash, lesion, or ulcer.   LABORATORY PANEL:   CBC Recent Labs  Lab 08/12/17 0428  WBC 8.9  HGB 8.9*  HCT 25.7*  PLT 134*   ------------------------------------------------------------------------------------------------------------------ Chemistries  Recent Labs  Lab 08/11/17 2115 08/12/17 0428  NA 129* 132*  K 3.6 3.8  CL 94* 103  CO2 26 24  GLUCOSE 112* 100*  BUN 15 14  CREATININE 1.81* 1.66*  CALCIUM 8.7* 8.1*  AST 21  --   ALT 22  --   ALKPHOS 113  --   BILITOT 1.3*  --    ------------------------------------------------------------------------------------------------------------------  Cardiac Enzymes No results for input(s): TROPONINI in the last 168 hours. ------------------------------------------------------------------------------------------------------------------  RADIOLOGY:  Dg Chest Port 1 View  Result Date: 08/11/2017 CLINICAL DATA:  Febrile patient with history of multiple myeloma post recent stem cell transplant. EXAM: PORTABLE CHEST 1 VIEW COMPARISON:  05/29/2017 FINDINGS: Cardiomediastinal silhouette is normal. Mediastinal contours appear intact. There is no evidence of pleural effusion or pneumothorax. Vague patchy areas of airspace consolidation in the right mid lung field. Osseous structures are without acute abnormality. Soft tissues are grossly normal. IMPRESSION: Interval development of vague patchy areas of airspace consolidation the right mid lung field, concerning for early developing multifocal pneumonia. Electronically Signed   By: Fidela Salisbury M.D.   On: 08/11/2017 21:10     ASSESSMENT AND PLAN:  47 year old male patient with history of  multiple myeloma  currently under service for sepsis, pneumonia, dehydration and hyponatremia   1. sepsis secondary to pneumonia. Continue IV cefepime antibiotics Follow-up cultures  2. Multifocal pneumonia  Continue IV cefepime antibiotic  3.  Hyponatremia improving IV fluid hydration  4.  Multiple myeloma oncology follow-up   5.  DVT prophylaxis subcu heparin     All the records are reviewed and case discussed with Care Management/Social Worker. Management plans discussed with the patient, family and they are in agreement.  CODE STATUS: Full code  DVT Prophylaxis: SCDs  TOTAL TIME TAKING CARE OF THIS PATIENT: 35 minutes.   POSSIBLE D/C IN 2 to 3 DAYS, DEPENDING ON CLINICAL CONDITION.  Saundra Shelling M.D on 08/12/2017 at 2:58 PM  Between 7am to 6pm - Pager - [phone]  After 6pm go to [email or web address] - password [email or web address] [facility]  Office  [phone]  CC: Primary care physician; Patient, No Pcp Per  Note: This dictation was prepared with Dragon dictation along with smaller phrase technology. Any transcriptional errors that result from this process are unintentional.

## 2017-08-12 NOTE — Telephone Encounter (Signed)
Dr. Estanislado Pandy has requested an oncology consult for this patient in Room 126 for Myeloma, status post transplant, now with sepsis and pneumonia.       dhs

## 2017-08-12 NOTE — Plan of Care (Signed)
  Problem: Education: Goal: Knowledge of General Education information will improve Outcome: Progressing   Problem: Health Behavior/Discharge Planning: Goal: Ability to manage health-related needs will improve Outcome: Progressing   Problem: Clinical Measurements: Goal: Ability to maintain clinical measurements within normal limits will improve Outcome: Progressing Goal: Respiratory complications will improve Outcome: Progressing Goal: Cardiovascular complication will be avoided Outcome: Progressing   Problem: Coping: Goal: Level of anxiety will decrease Outcome: Progressing   Problem: Elimination: Goal: Will not experience complications related to bowel motility Outcome: Progressing Goal: Will not experience complications related to urinary retention Outcome: Progressing

## 2017-08-12 NOTE — Consult Note (Signed)
Hematology/Oncology Consult note [facility] [phone] [phone]  Patient Care Team: Patient, No Pcp Per as PCP - General (General Practice)   Name of the patient: James House  [id number]  [birth year]    Reason for consult: h/o multiple myeloma s/p transplant   Requesting physician: Dr. Estanislado Pandy  Date of visit: 08/12/2017    History of presenting illness-patient is a 47 year old male with a past medical history significant for multiple myeloma status post ASCT in October 2018. He is currently on maintenance revlimid. His oncologist is DR. Vella [facility]head at [facility]  He presented to the hospital with symptoms of increased cough and malaise and is currently getting treated for pneumonia.  We have been consulted given his history of multiple myeloma. Clinically he improving. He may be discharged tomorrow. He still feeels weak but overall better  ECOG PS-1    Review of systems- Review of Systems  Constitutional: Positive for malaise/fatigue. Negative for chills, fever and weight loss.  HENT: Negative for congestion, ear discharge and nosebleeds.   Eyes: Negative for blurred vision.  Respiratory: Positive for cough. Negative for hemoptysis, sputum production, shortness of breath and wheezing.   Cardiovascular: Negative for chest pain, palpitations, orthopnea and claudication.  Gastrointestinal: Negative for abdominal pain, blood in stool, constipation, diarrhea, heartburn, melena, nausea and vomiting.  Genitourinary: Negative for dysuria, flank pain, frequency, hematuria and urgency.  Musculoskeletal: Negative for back pain, joint pain and myalgias.  Skin: Negative for rash.  Neurological: Negative for dizziness, tingling, focal weakness, seizures, weakness and headaches.  Endo/Heme/Allergies: Does not bruise/bleed easily.  Psychiatric/Behavioral: Negative for depression and suicidal ideas. The patient does not have insomnia.     No Known  Allergies  Patient Active Problem List   Diagnosis Date Noted  . Severe sepsis ([place]) 08/11/2017  . Multifocal pneumonia 08/11/2017  . AKI (acute kidney injury) ([place]) 08/11/2017  . Hyponatremia 08/11/2017  . Multiple myeloma ([place]) 08/11/2017     Past Medical History:  Diagnosis Date  . Cancer ([place])   . Hypertension   . Multiple myeloma ([place]) 08/11/2017     Past Surgical History:  Procedure Laterality Date  . ABDOMINAL SURGERY    . JOINT REPLACEMENT     right knee  . KNEE SURGERY Left     Social History   Socioeconomic History  . Marital status: Married    Spouse name: [name]  . Number of children: 3  . Years of education: Not on file  . Highest education level: Not on file  Occupational History  . Not on file  Social Needs  . Financial resource strain: Not hard at all  . Food insecurity:    Worry: Never true    Inability: Never true  . Transportation needs:    Medical: No    Non-medical: No  Tobacco Use  . Smoking status: Former Research scientist (life sciences)  . Smokeless tobacco: Current User    Types: Snuff  Substance and Sexual Activity  . Alcohol use: No  . Drug use: No  . Sexual activity: Yes  Lifestyle  . Physical activity:    Days per week: 7 days    Minutes per session: 30 min  . Stress: Only a little  Relationships  . Social connections:    Talks on phone: Once a week    Gets together: Once a week    Attends religious service: More than 4 times per year    Active member of club or organization: No    Attends  meetings of clubs or organizations: Never    Relationship status: Married  . Intimate partner violence:    Fear of current or ex partner: No    Emotionally abused: No    Physically abused: No    Forced sexual activity: No  Other Topics Concern  . Not on file  Social History Narrative   Live in private residence with spouse and mother in law     History reviewed. No pertinent family history.   Current Facility-Administered Medications:  .   acetaminophen (TYLENOL) tablet 650 mg, 650 mg, Oral, Q6H PRN **OR** acetaminophen (TYLENOL) suppository 650 mg, 650 mg, Rectal, Q6H PRN, Lance Coon, MD .  acyclovir (ZOVIRAX) 200 MG capsule 400 mg, 400 mg, Oral, BID, Lance Coon, MD, 400 mg at 08/12/17 1151 .  aspirin EC tablet 81 mg, 81 mg, Oral, Daily, Lance Coon, MD, 81 mg at 08/12/17 1149 .  benzonatate (TESSALON) capsule 100-200 mg, 100-200 mg, Oral, TID PRN, Lance Coon, MD .  buPROPion (WELLBUTRIN XL) 24 hr tablet 300 mg, 300 mg, Oral, Daily, Lance Coon, MD, 300 mg at 08/12/17 1150 .  ceFEPIme (MAXIPIME) 2 g in sodium chloride 0.9 % 100 mL IVPB, 2 g, Intravenous, Q12H, Lance Coon, MD, Stopped at 08/12/17 [phone] .  DULoxetine (CYMBALTA) DR capsule 60 mg, 60 mg, Oral, Daily, Lance Coon, MD, 60 mg at 08/12/17 1148 .  enoxaparin (LOVENOX) injection 40 mg, 40 mg, Subcutaneous, Q24H, Lance Coon, MD, 40 mg at 08/12/17 0221 .  escitalopram (LEXAPRO) tablet 10 mg, 10 mg, Oral, Daily, Lance Coon, MD, 10 mg at 08/12/17 1150 .  gabapentin (NEURONTIN) capsule 600 mg, 600 mg, Oral, TID, Lance Coon, MD, 600 mg at 08/12/17 1149 .  guaiFENesin-dextromethorphan (ROBITUSSIN DM) 100-10 MG/5ML syrup 5 mL, 5 mL, Oral, Q4H PRN, Lance Coon, MD .  ipratropium-albuterol (DUONEB) 0.5-2.5 (3) MG/3ML nebulizer solution 3 mL, 3 mL, Nebulization, Q4H PRN, Lance Coon, MD .  lenalidomide (REVLIMID) capsule 10 mg, 10 mg, Oral, Daily, Lance Coon, MD, 10 mg at 08/12/17 1228 .  ondansetron (ZOFRAN) tablet 4 mg, 4 mg, Oral, Q6H PRN **OR** ondansetron (ZOFRAN) injection 4 mg, 4 mg, Intravenous, Q6H PRN, Lance Coon, MD .  oxyCODONE (Oxy IR/ROXICODONE) immediate release tablet 5 mg, 5 mg, Oral, Daily PRN, Lance Coon, MD, 5 mg at 08/12/17 0725 .  oxyCODONE (OXYCONTIN) 12 hr tablet 10 mg, 10 mg, Oral, Q12H, Harrie Foreman, MD, 10 mg at 08/12/17 1148 .  pantoprazole (PROTONIX) EC tablet 40 mg, 40 mg, Oral, Daily, Lance Coon, MD, 40 mg at  08/12/17 1148 .  pregabalin (LYRICA) capsule 150 mg, 150 mg, Oral, BID, Lance Coon, MD, 150 mg at 08/12/17 1148 .  senna-docusate (Senokot-S) tablet 1 tablet, 1 tablet, Oral, QHS, Pyreddy, Pavan, MD .  tiZANidine (ZANAFLEX) tablet 4 mg, 4 mg, Oral, Q8H PRN, Lance Coon, MD .  traMADol (ULTRAM) tablet 50 mg, 50 mg, Oral, Q6H PRN, Pyreddy, Pavan, MD .  traZODone (DESYREL) tablet 50 mg, 50 mg, Oral, QHS PRN, Lance Coon, MD   Physical exam:  Vitals:   08/11/17 2307 08/11/17 2327 08/12/17 0018 08/12/17 0535  BP:  97/61 114/77 100/72  Pulse:  97 83 72  Resp:  _0 Temp: 98.5 F (36.9 C)  98.1 F (36.7 C) 97.6 F (36.4 C)  TempSrc: Oral  Oral Oral  SpO2:  98% 100% 100%  Weight:   152 lb 5.4 oz (69.1 kg) 152 lb (68.9 kg)  Height:   5' 7" (  1.702 m)    Physical Exam  Constitutional: He is oriented to person, place, and time.  Thin. Appears in no acute distress  HENT:  Head: Normocephalic and atraumatic.  Eyes: Pupils are equal, round, and reactive to light. EOM are normal.  Neck: Normal range of motion.  Cardiovascular: Normal rate, regular rhythm and normal heart sounds.  Pulmonary/Chest: Effort normal and breath sounds normal.  Abdominal: Soft. Bowel sounds are normal.  Neurological: He is alert and oriented to person, place, and time.  Skin: Skin is warm and dry.       CMP Latest Ref Rng & Units 08/12/2017  Glucose 65 - 99 mg/dL 100(H)  BUN 6 - 20 mg/dL 14  Creatinine 0.61 - 1.24 mg/dL 1.66(H)  Sodium 135 - 145 mmol/L 132(L)  Potassium 3.5 - 5.1 mmol/L 3.8  Chloride 101 - 111 mmol/L 103  CO2 22 - 32 mmol/L 24  Calcium 8.9 - 10.3 mg/dL 8.1(L)  Total Protein 6.5 - 8.1 g/dL -  Total Bilirubin 0.3 - 1.2 mg/dL -  Alkaline Phos 38 - 126 U/L -  AST 15 - 41 U/L -  ALT 17 - 63 U/L -   CBC Latest Ref Rng & Units 08/12/2017  WBC 3.8 - 10.6 K/uL 8.9  Hemoglobin 13.0 - 18.0 g/dL 8.9(L)  Hematocrit 40.0 - 52.0 % 25.7(L)  Platelets 150 - 440 K/uL 134(L)     _0 @  Dg Chest Port 1 View  Result Date: 08/11/2017 CLINICAL DATA:  Febrile patient with history of multiple myeloma post recent stem cell transplant. EXAM: PORTABLE CHEST 1 VIEW COMPARISON:  05/29/2017 FINDINGS: Cardiomediastinal silhouette is normal. Mediastinal contours appear intact. There is no evidence of pleural effusion or pneumothorax. Vague patchy areas of airspace consolidation in the right mid lung field. Osseous structures are without acute abnormality. Soft tissues are grossly normal. IMPRESSION: Interval development of vague patchy areas of airspace consolidation the right mid lung field, concerning for early developing multifocal pneumonia. Electronically Signed   By: Fidela Salisbury M.D.   On: 08/11/2017 21:10    Assessment and plan- Patient is a 47 y.o. male with h/o multiple myeloma treated at [facility] on maintenance revlimid s/p ASCT. Admitted for HCAP  From pneumonia standpoint he is doing better and likely to be discharged soon. Hold revlimid while inpatient. We will inform his oncologist Dr. Tracey Harries about this.   He does have moderate anemia and mild thrombocytopenia which may be due to acute infection. I do not see outside notes and labs on care everywhere. This can be followed by Dr. Tracey Harries as an outpatient    Visit Diagnosis 1. Sepsis, due to unspecified organism ([place])   2. Immunosuppressed status ([place])   3. Multifocal pneumonia   4. AKI (acute kidney injury) ([place])   5. Hyponatremia     Dr. Randa Evens, MD, MPH [facility] at [facility] [phone] 08/12/2017

## 2017-08-12 NOTE — Telephone Encounter (Signed)
I/ Loma Sousa has contacted the office of Vella Redhead and was sent to the triage line to leave a message. I left the message per Dr. Janese Banks request to hold Revlymid until the patient is able to get into the office to see Dr. Tracey Harries and that Mr. Sandria Manly will be discharged tomorrow form the [facility]). To please contact our office with any question or concerns.

## 2017-08-12 NOTE — Progress Notes (Signed)
Patient admitted for SOB s/t pnuemonia w/ h/o multiple myeloma. Patient takes max doses of wellbutrin XL (300 mg daily), Cymbalta (60 mg daily), and Lexapro (10 mg daily).  Confirmed w/ med rec tech, patient, and RN that patient in fact takes all three of these medications, and are from the same doctor. Patient and RN both confirmed that patient takes all three of these medications and prescribed from the same doctor.  Informed RN about concern for serotonin syndrome/hypertensive crisis when on multiple SSRI/SNRI/DNRI. Patient was asked a few questions to determine if there were any issues w/ therapy, such as onset of HTN (patient states had HTN prior to this therapy), no fever, palpitations etc.  Will continue patient on triple antidepressant therapy. Will continue to monitor for s/sx concerning for serotonin syndrome (tachycardiac, hyperhidrosis) or hypertensive crisis.  James House, PharmD, BCPS Clinical Pharmacist 08/12/2017

## 2017-08-12 NOTE — Progress Notes (Signed)
Advanced care plan.  Purpose of the Encounter: CODE STATUS  Parties in Attendance:Patient Patient's Decision Capacity: Good Subjective/Patient's story: Admitted for fever History of myeloma Objective/Medical story Has sepsis and pneumonia Goals of care determination:  Advance care directives discussed with the patient For now patient and family want everything done, cardiac resuscitation, intubation and ventilator if the need arises CODE STATUS: Full code Time spent discussing advanced care planning: 16 minutes

## 2017-08-13 ENCOUNTER — Telehealth: Payer: Self-pay | Admitting: Oncology

## 2017-08-13 LAB — BASIC METABOLIC PANEL
ANION GAP: 5 (ref 5–15)
BUN: 14 mg/dL (ref 6–20)
CALCIUM: 8.4 mg/dL — AB (ref 8.9–10.3)
CO2: 27 mmol/L (ref 22–32)
CREATININE: 1.51 mg/dL — AB (ref 0.61–1.24)
Chloride: 105 mmol/L (ref 101–111)
GFR calc Af Amer: >60 mL/min (ref >60)
GFR calc non Af Amer: 54 mL/min — ABNORMAL LOW (ref >60)
GLUCOSE: 91 mg/dL (ref 65–99)
Potassium: 3.9 mmol/L (ref 3.5–5.1)
Sodium: 137 mmol/L (ref 135–145)

## 2017-08-13 LAB — CBC
HCT: 28.4 % — ABNORMAL LOW (ref 40.0–52.0)
Hemoglobin: 9.8 g/dL — ABNORMAL LOW (ref 13.0–18.0)
MCH: 33.4 pg (ref 26.0–34.0)
MCHC: 34.6 g/dL (ref 32.0–36.0)
MCV: 96.6 fL (ref 80.0–100.0)
PLATELETS: 159 10*3/uL (ref 150–440)
RBC: 2.94 MIL/uL — ABNORMAL LOW (ref 4.40–5.90)
RDW: 15.6 % — AB (ref 11.5–14.5)
WBC: 4.1 10*3/uL (ref 3.8–10.6)

## 2017-08-13 LAB — URINE CULTURE: CULTURE: NO GROWTH

## 2017-08-13 LAB — HIV ANTIBODY (ROUTINE TESTING W REFLEX): HIV Screen 4th Generation wRfx: NONREACTIVE

## 2017-08-13 MED ORDER — LEVOFLOXACIN 750 MG PO TABS: 750.0000 mg | ORAL_TABLET | Freq: Every day | ORAL | 0 refills | Status: DC

## 2017-08-13 NOTE — Telephone Encounter (Signed)
Hosp F/U per Dr Rao/schd James House. Ref by Dr. Benjie Karvonen, [facility]) Pt will be discharged from [facility] today/08/13/17, per Mary/Nurse 1-C # [phone]. Dx Multiple Myeloma. Patient is transferring care from [facility]. Rcd request form faxed to [facility] for pt to sign and will be faxed back to [place] to request rcds from [place]. Appt schd and conf with patient and [facility], for 08/19/17 @ 11:00 a.m. New pat pkt given to Registration for patient to complete upon arrival.  Tiffany/HIM is aware of Medical records to be requested.

## 2017-08-13 NOTE — Discharge Summary (Signed)
[facility] NAME: James House    MR#:  [id number]  DATE OF BIRTH:  06/03/1970  DATE OF ADMISSION:  08/11/2017 ADMITTING PHYSICIAN: Lance Coon, MD  DATE OF DISCHARGE: 08/13/2017  PRIMARY CARE PHYSICIAN: No primary care provider on file.    ADMISSION DIAGNOSIS:  Hyponatremia [E87.1] Immunosuppressed status ([place]) [D89.9] AKI (acute kidney injury) ([place]) [N17.9] Sepsis, due to unspecified organism ([place]) [A41.9] Multifocal pneumonia [J18.9]  DISCHARGE DIAGNOSIS:  Principal Problem:   Severe sepsis ([place]) Active Problems:   Multifocal pneumonia   AKI (acute kidney injury) ([place])   Hyponatremia   Multiple myeloma ([place])   SECONDARY DIAGNOSIS:   Past Medical History:  Diagnosis Date  . Cancer ([place])   . Hypertension   . Multiple myeloma ([place]) 08/11/2017    HOSPITAL COURSE:   47 y/o male with history of multiple myeloma who presented to the emergency room complaining of cough and fever.   1.  Sepsis: Patient presented with leukocytosis and tachycardia.  Sepsis is due to multifocal pneumonia.  Sepsis has resolved.  2.  Multifocal pneumonia: Patient was on IV cefepime.  He will be transitioned to oral Levaquin.  3.  Acute kidney injury in the setting of sepsis which has improved with IV fluids  4.  Hyponatremia due to poor oral intake and sepsis which is improved with IV fluids.  5.  Multiple myeloma: His oncologist has recommended to hold Revlimid until the patient is able to get in to the office to see Dr. Edison Pace.  Patient also wants to establish care at [facility] due to the distance from his home.   DISCHARGE CONDITIONS AND DIET:   Stable for discharge and regular diet  CONSULTS OBTAINED:  Treatment Team:  Sindy Guadeloupe, MD  DRUG ALLERGIES:  No Known Allergies  DISCHARGE MEDICATIONS:   Allergies as of 08/13/2017   No Known Allergies     Medication List    STOP taking these medications   azithromycin 250 MG  tablet Commonly known as:  ZITHROMAX Z-PAK   benzonatate 100 MG capsule Commonly known as:  TESSALON PERLES   HYDROcodone-acetaminophen 5-325 MG tablet Commonly known as:  NORCO/VICODIN   lidocaine 5 % Commonly known as:  LIDODERM   methocarbamol 500 MG tablet Commonly known as:  ROBAXIN   REVLIMID 10 MG capsule Generic drug:  lenalidomide     TAKE these medications   acyclovir 200 MG capsule Commonly known as:  ZOVIRAX Take 400 mg by mouth 2 (two) times daily.   albuterol 108 (90 Base) MCG/ACT inhaler Commonly known as:  PROVENTIL HFA;VENTOLIN HFA Inhale 2 puffs into the lungs every 6 (six) hours as needed for wheezing or shortness of breath.   aspirin EC 81 MG tablet Take 81 mg by mouth daily.   buPROPion 300 MG 24 hr tablet Commonly known as:  WELLBUTRIN XL Take 300 mg by mouth daily.   DULoxetine 60 MG capsule Commonly known as:  CYMBALTA Take 60 mg by mouth daily.   escitalopram 10 MG tablet Commonly known as:  LEXAPRO Take 10 mg by mouth daily.   gabapentin 300 MG capsule Commonly known as:  NEURONTIN Take 600 mg by mouth 3 (three) times daily.   levofloxacin 750 MG tablet Commonly known as:  LEVAQUIN Take 1 tablet (750 mg total) by mouth daily.   oxyCODONE 5 MG immediate release tablet Commonly known as:  Oxy IR/ROXICODONE Take 5 mg by mouth daily as needed for severe pain  or breakthrough pain.   pantoprazole 40 MG tablet Commonly known as:  PROTONIX Take 40 mg by mouth daily.   pregabalin 150 MG capsule Commonly known as:  LYRICA Take 150 mg by mouth 2 (two) times daily.   prochlorperazine 10 MG tablet Commonly known as:  COMPAZINE Take 10 mg by mouth every 6 (six) hours as needed for nausea or vomiting.   tiZANidine 4 MG tablet Commonly known as:  ZANAFLEX Take 4 mg by mouth every 8 (eight) hours as needed for muscle spasms.   traZODone 50 MG tablet Commonly known as:  DESYREL Take 50 mg by mouth at bedtime as needed for sleep.    Vitamin D (Ergocalciferol) 50000 units Caps capsule Commonly known as:  DRISDOL Take 50,000 Units by mouth every 7 (seven) days.   XTAMPZA ER 13.5 MG C12a Generic drug:  oxyCODONE ER Take 1 capsule by mouth 2 (two) times daily.         Today   CHIEF COMPLAINT:   No acute issues overnight.  Patient is ready for discharge today.   VITAL SIGNS:  Blood pressure 94/61, pulse 82, temperature 97.8 F (36.6 C), temperature source Oral, resp. rate 15, height '5\' 7"'  (1.702 m), weight 68.4 kg (150 lb 12.8 oz), SpO2 98 %.   REVIEW OF SYSTEMS:  Review of Systems  Constitutional: Negative.  Negative for chills, fever and malaise/fatigue.  HENT: Negative.  Negative for ear discharge, ear pain, hearing loss, nosebleeds and sore throat.   Eyes: Negative.  Negative for blurred vision and pain.  Respiratory: Negative.  Negative for cough, hemoptysis, shortness of breath and wheezing.   Cardiovascular: Negative.  Negative for chest pain, palpitations and leg swelling.  Gastrointestinal: Negative.  Negative for abdominal pain, blood in stool, diarrhea, nausea and vomiting.  Genitourinary: Negative.  Negative for dysuria.  Musculoskeletal: Negative.  Negative for back pain.  Skin: Negative.   Neurological: Negative for dizziness, tremors, speech change, focal weakness, seizures and headaches.  Endo/Heme/Allergies: Negative.  Does not bruise/bleed easily.  Psychiatric/Behavioral: Negative.  Negative for depression, hallucinations and suicidal ideas.     PHYSICAL EXAMINATION:  GENERAL:  47 y.o.-year-old patient lying in the bed with no acute distress.  NECK:  Supple, no jugular venous distention. No thyroid enlargement, no tenderness.  LUNGS: Normal breath sounds bilaterally, no wheezing, rales,rhonchi  No use of accessory muscles of respiration.  CARDIOVASCULAR: S1, S2 normal. No murmurs, rubs, or gallops.  ABDOMEN: Soft, non-tender, non-distended. Bowel sounds present. No organomegaly or  mass.  EXTREMITIES: No pedal edema, cyanosis, or clubbing.  PSYCHIATRIC: The patient is alert and oriented x 3.  SKIN: No obvious rash, lesion, or ulcer.   DATA REVIEW:   CBC Recent Labs  Lab 08/13/17 0600  WBC 4.1  HGB 9.8*  HCT 28.4*  PLT 159    Chemistries  Recent Labs  Lab 08/11/17 2115  08/13/17 0600  NA 129*   < > 137  K 3.6   < > 3.9  CL 94*   < > 105  CO2 26   < > 27  GLUCOSE 112*   < > 91  BUN 15   < > 14  CREATININE 1.81*   < > 1.51*  CALCIUM 8.7*   < > 8.4*  AST 21  --   --   ALT 22  --   --   ALKPHOS 113  --   --   BILITOT 1.3*  --   --    < > =  values in this interval not displayed.    Cardiac Enzymes No results for input(s): TROPONINI in the last 168 hours.  Microbiology Results  '@[id number]' @  RADIOLOGY:  Dg Chest Port 1 View  Result Date: 08/11/2017 CLINICAL DATA:  Febrile patient with history of multiple myeloma post recent stem cell transplant. EXAM: PORTABLE CHEST 1 VIEW COMPARISON:  05/29/2017 FINDINGS: Cardiomediastinal silhouette is normal. Mediastinal contours appear intact. There is no evidence of pleural effusion or pneumothorax. Vague patchy areas of airspace consolidation in the right mid lung field. Osseous structures are without acute abnormality. Soft tissues are grossly normal. IMPRESSION: Interval development of vague patchy areas of airspace consolidation the right mid lung field, concerning for early developing multifocal pneumonia. Electronically Signed   By: Fidela Salisbury M.D.   On: 08/11/2017 21:10      Allergies as of 08/13/2017   No Known Allergies     Medication List    STOP taking these medications   azithromycin 250 MG tablet Commonly known as:  ZITHROMAX Z-PAK   benzonatate 100 MG capsule Commonly known as:  TESSALON PERLES   HYDROcodone-acetaminophen 5-325 MG tablet Commonly known as:  NORCO/VICODIN   lidocaine 5 % Commonly known as:  LIDODERM   methocarbamol 500 MG tablet Commonly known as:   ROBAXIN   REVLIMID 10 MG capsule Generic drug:  lenalidomide     TAKE these medications   acyclovir 200 MG capsule Commonly known as:  ZOVIRAX Take 400 mg by mouth 2 (two) times daily.   albuterol 108 (90 Base) MCG/ACT inhaler Commonly known as:  PROVENTIL HFA;VENTOLIN HFA Inhale 2 puffs into the lungs every 6 (six) hours as needed for wheezing or shortness of breath.   aspirin EC 81 MG tablet Take 81 mg by mouth daily.   buPROPion 300 MG 24 hr tablet Commonly known as:  WELLBUTRIN XL Take 300 mg by mouth daily.   DULoxetine 60 MG capsule Commonly known as:  CYMBALTA Take 60 mg by mouth daily.   escitalopram 10 MG tablet Commonly known as:  LEXAPRO Take 10 mg by mouth daily.   gabapentin 300 MG capsule Commonly known as:  NEURONTIN Take 600 mg by mouth 3 (three) times daily.   levofloxacin 750 MG tablet Commonly known as:  LEVAQUIN Take 1 tablet (750 mg total) by mouth daily.   oxyCODONE 5 MG immediate release tablet Commonly known as:  Oxy IR/ROXICODONE Take 5 mg by mouth daily as needed for severe pain or breakthrough pain.   pantoprazole 40 MG tablet Commonly known as:  PROTONIX Take 40 mg by mouth daily.   pregabalin 150 MG capsule Commonly known as:  LYRICA Take 150 mg by mouth 2 (two) times daily.   prochlorperazine 10 MG tablet Commonly known as:  COMPAZINE Take 10 mg by mouth every 6 (six) hours as needed for nausea or vomiting.   tiZANidine 4 MG tablet Commonly known as:  ZANAFLEX Take 4 mg by mouth every 8 (eight) hours as needed for muscle spasms.   traZODone 50 MG tablet Commonly known as:  DESYREL Take 50 mg by mouth at bedtime as needed for sleep.   Vitamin D (Ergocalciferol) 50000 units Caps capsule Commonly known as:  DRISDOL Take 50,000 Units by mouth every 7 (seven) days.   XTAMPZA ER 13.5 MG C12a Generic drug:  oxyCODONE ER Take 1 capsule by mouth 2 (two) times daily.          Management plans discussed with the patient and  he is in  agreement. Stable for discharge home  Patient should follow up with pcp  CODE STATUS:     Code Status Orders  (From admission, onward)        Start     Ordered   08/12/17 0019  Full code  Continuous     08/12/17 0018    Code Status History    This patient has a current code status but no historical code status.      TOTAL TIME TAKING CARE OF THIS PATIENT: 38 minutes.    Note: This dictation was prepared with Dragon dictation along with smaller phrase technology. Any transcriptional errors that result from this process are unintentional.  Deidrick Rainey M.D on 08/13/2017 at 10:49 AM  Between 7am to 6pm - Pager - [phone] After 6pm go to [email or web address] - password [email or web address] [facility]  Office  [phone]  CC: Primary care physician; No primary care provider on file.

## 2017-08-13 NOTE — Plan of Care (Signed)
Pt is d/ced home.  Is transferring tx of multiple myeloma to [facility].  Admitted with sepsis/PNA.  Will go home on abx.  Has chronic pain.  Has meds for breakthrough and long acting.  Gave education to start a BM program - daily stool softener and if no BM w/in 3 days take a laxative.  Gave several suggestions.  Pt had BM today but before that it was last Friday.  Removed IV.  Reviewed d/c instructions. Returned meds from pharmacy.  Also got his signature on release to get his information from [place].  Pt will transport home with wife.

## 2017-08-16 LAB — CULTURE, BLOOD (ROUTINE X 2)
CULTURE: NO GROWTH
Culture: NO GROWTH
SPECIAL REQUESTS: ADEQUATE

## 2017-08-19 ENCOUNTER — Other Ambulatory Visit: Payer: Self-pay

## 2017-08-19 ENCOUNTER — Encounter: Payer: Self-pay | Admitting: Oncology

## 2017-08-19 ENCOUNTER — Inpatient Hospital Stay: Payer: Medicaid Other

## 2017-08-19 ENCOUNTER — Telehealth: Payer: Self-pay | Admitting: *Deleted

## 2017-08-19 ENCOUNTER — Telehealth: Payer: Self-pay | Admitting: Pharmacist

## 2017-08-19 ENCOUNTER — Inpatient Hospital Stay: Payer: Medicaid Other | Attending: Oncology | Admitting: Oncology

## 2017-08-19 VITALS — BP 130/83 | HR 87 | Temp 97.7°F | Resp 12 | Ht 67.0 in | Wt 147.1 lb

## 2017-08-19 DIAGNOSIS — Z87891 Personal history of nicotine dependence: Secondary | ICD-10-CM

## 2017-08-19 DIAGNOSIS — G620 Drug-induced polyneuropathy: Secondary | ICD-10-CM | POA: Insufficient documentation

## 2017-08-19 DIAGNOSIS — T451X5A Adverse effect of antineoplastic and immunosuppressive drugs, initial encounter: Secondary | ICD-10-CM | POA: Insufficient documentation

## 2017-08-19 DIAGNOSIS — C9001 Multiple myeloma in remission: Secondary | ICD-10-CM | POA: Diagnosis not present

## 2017-08-19 DIAGNOSIS — R5383 Other fatigue: Secondary | ICD-10-CM | POA: Insufficient documentation

## 2017-08-19 DIAGNOSIS — Z9484 Stem cells transplant status: Secondary | ICD-10-CM | POA: Diagnosis not present

## 2017-08-19 DIAGNOSIS — M549 Dorsalgia, unspecified: Secondary | ICD-10-CM | POA: Diagnosis not present

## 2017-08-19 DIAGNOSIS — G8929 Other chronic pain: Secondary | ICD-10-CM | POA: Insufficient documentation

## 2017-08-19 DIAGNOSIS — N189 Chronic kidney disease, unspecified: Secondary | ICD-10-CM | POA: Insufficient documentation

## 2017-08-19 DIAGNOSIS — G4700 Insomnia, unspecified: Secondary | ICD-10-CM | POA: Insufficient documentation

## 2017-08-19 DIAGNOSIS — G549 Nerve root and plexus disorder, unspecified: Secondary | ICD-10-CM | POA: Diagnosis not present

## 2017-08-19 LAB — COMPREHENSIVE METABOLIC PANEL
ALK PHOS: 131 U/L — AB (ref 38–126)
ALT: 21 U/L (ref 17–63)
AST: 21 U/L (ref 15–41)
Albumin: 3.6 g/dL (ref 3.5–5.0)
Anion gap: 9 (ref 5–15)
BUN: 11 mg/dL (ref 6–20)
CALCIUM: 9.3 mg/dL (ref 8.9–10.3)
CHLORIDE: 104 mmol/L (ref 101–111)
CO2: 26 mmol/L (ref 22–32)
CREATININE: 1.68 mg/dL — AB (ref 0.61–1.24)
GFR calc Af Amer: 55 mL/min — ABNORMAL LOW (ref >60)
GFR, EST NON AFRICAN AMERICAN: 47 mL/min — AB (ref >60)
Glucose, Bld: 97 mg/dL (ref 65–99)
Potassium: 3.7 mmol/L (ref 3.5–5.1)
Sodium: 139 mmol/L (ref 135–145)
Total Bilirubin: 0.4 mg/dL (ref 0.3–1.2)
Total Protein: 7.4 g/dL (ref 6.5–8.1)

## 2017-08-19 LAB — CBC WITH DIFFERENTIAL/PLATELET
BASOS PCT: 1 %
Basophils Absolute: 0 10*3/uL (ref 0–0.1)
EOS ABS: 0.1 10*3/uL (ref 0–0.7)
EOS PCT: 1 %
HCT: 36.5 % — ABNORMAL LOW (ref 40.0–52.0)
Hemoglobin: 12.6 g/dL — ABNORMAL LOW (ref 13.0–18.0)
LYMPHS ABS: 0.7 10*3/uL — AB (ref 1.0–3.6)
Lymphocytes Relative: 14 %
MCH: 32.6 pg (ref 26.0–34.0)
MCHC: 34.4 g/dL (ref 32.0–36.0)
MCV: 94.7 fL (ref 80.0–100.0)
Monocytes Absolute: 0.4 10*3/uL (ref 0.2–1.0)
Monocytes Relative: 8 %
Neutro Abs: 3.8 10*3/uL (ref 1.4–6.5)
Neutrophils Relative %: 76 %
PLATELETS: 299 10*3/uL (ref 150–440)
RBC: 3.86 MIL/uL — AB (ref 4.40–5.90)
RDW: 15.2 % — ABNORMAL HIGH (ref 11.5–14.5)
WBC: 5 10*3/uL (ref 3.8–10.6)

## 2017-08-19 LAB — IRON AND TIBC
Iron: 79 ug/dL (ref 45–182)
Saturation Ratios: 30 % (ref 17.9–39.5)
TIBC: 260 ug/dL (ref 250–450)
UIBC: 181 ug/dL

## 2017-08-19 LAB — VITAMIN B12: Vitamin B-12: 208 pg/mL (ref 180–914)

## 2017-08-19 LAB — FERRITIN: FERRITIN: 206 ng/mL (ref 24–336)

## 2017-08-19 NOTE — Telephone Encounter (Signed)
Oral Chemotherapy Pharmacist Encounter   Spoke with [name]. He is transferring his care from [facility] and he is resuming his Revlimid.  Reviewed the new dosing schedule with Mr. James House. He will take his Revlmid for 3 weeks on and 1 weeks off. This change in dosing schedule will hopefully help with his fatigue. He reports fatigue as his only medication side effect. Consent was obtained.   He fills his Revlimid with [facility]. I added [facility] to [facility] list.    Darl Pikes, PharmD, BCPS Hematology/Oncology Clinical Pharmacist [facility] [phone]  08/19/2017 11:47 AM

## 2017-08-19 NOTE — Telephone Encounter (Signed)
Dental office provided form that is not Zometa clearance. Will request clearance.

## 2017-08-19 NOTE — Progress Notes (Signed)
Recently hospitalized for pneumonia. Patient stopped Revlimed while is hospital.

## 2017-08-19 NOTE — Telephone Encounter (Signed)
Contacted pain clinic for assistance with pain medication. Patient is a patient at the clinic. Clinic advised patient to contact the Nurse Line for help with prescriptions. Patient given number and information verbalized understanding.

## 2017-08-19 NOTE — Telephone Encounter (Signed)
Contact dental to request clearance for Zometa. Staff stated she would fax what they have.

## 2017-08-20 ENCOUNTER — Telehealth: Payer: Self-pay

## 2017-08-20 LAB — KAPPA/LAMBDA LIGHT CHAINS
KAPPA, LAMDA LIGHT CHAIN RATIO: 1.32 (ref 0.26–1.65)
Kappa free light chain: 24.5 mg/L — ABNORMAL HIGH (ref 3.3–19.4)
Lambda free light chains: 18.6 mg/L (ref 5.7–26.3)

## 2017-08-20 LAB — PROTEIN ELECTRO, RANDOM URINE
ALPHA-1-GLOBULIN, U: 4.4 %
ALPHA-2-GLOBULIN, U: 18.5 %
Albumin ELP, Urine: 30.8 %
Beta Globulin, U: 27 %
Gamma Globulin, U: 19.3 %
Total Protein, Urine: 17 mg/dL

## 2017-08-20 NOTE — Telephone Encounter (Signed)
Spoke with James House to ask about his dental care. He report his appointment will be 02/03/18 to get the 3 fillings he would need. I have inform him if he could call and get an early appointment schedule due to Dr. Janese Banks would need a Zometa clarence. I have also informed the patient his B-12 levels were low and need to start taking B-12 (1,000 mcg) daily. The patient agreed to call Dentist office first thing in the morning to get an early appointment. James House also agreed to start taking B-12 1,000 mcg today. I will touch bases with James House tomorrow to see if any early appointment has been change.

## 2017-08-21 ENCOUNTER — Telehealth: Payer: Self-pay | Admitting: *Deleted

## 2017-08-21 LAB — MULTIPLE MYELOMA PANEL, SERUM
ALBUMIN SERPL ELPH-MCNC: 3.4 g/dL (ref 2.9–4.4)
ALBUMIN/GLOB SERPL: 1.1 (ref 0.7–1.7)
ALPHA 1: 0.2 g/dL (ref 0.0–0.4)
ALPHA2 GLOB SERPL ELPH-MCNC: 1 g/dL (ref 0.4–1.0)
B-GLOBULIN SERPL ELPH-MCNC: 1 g/dL (ref 0.7–1.3)
GLOBULIN, TOTAL: 3.1 g/dL (ref 2.2–3.9)
Gamma Glob SerPl Elph-Mcnc: 0.8 g/dL (ref 0.4–1.8)
IGA: 166 mg/dL (ref 90–386)
IGG (IMMUNOGLOBIN G), SERUM: 921 mg/dL (ref 700–1600)
IgM (Immunoglobulin M), Srm: 54 mg/dL (ref 20–172)
Total Protein ELP: 6.5 g/dL (ref 6.0–8.5)

## 2017-08-21 NOTE — Telephone Encounter (Signed)
Patient called to report that he has his dental appointment moved up to Monday the 6th at 8:30 so he can get started with his infusions.

## 2017-08-22 NOTE — Progress Notes (Signed)
Hematology/Oncology Consult note [facility] [phone] [phone]  Patient Care Team: Mock, [facility] Branch as PCP - General (Family Medicine) Vella Redhead, MD as PCP - Hematology/Oncology   Name of the patient: James House  [id number]  December 30, 1970    Diagnosis: light chain kappa multiple myeloma s/p ASCT currently in remission. Patient on maintenance revlimid  Date of visit: 08/22/17   History of presenting illness-patient is a 47 year old male with a history of kappa light chain myeloma that was treated by [name].  History is as follows:  1.  Patient presented with renal insufficiency with a creatinine of 2.4 in April 2018.  Kidney biopsy showed myeloma cast nephropathy.  Kappa light chain was 3004 and lambda 0.22 with a kappa lambda ratio of 13,000 655.  Skeletal survey showed multiple calvarial and marrow lesions.  Bone marrow biopsy in May 2018 showed 43% plasma cells.  He received CyBorD for cycle 1 in May 2018 and was subsequently switched to RVD.  He received 4 cycles followed by a repeat bone marrow biopsy in August 2018 which showed no increase in plasma cells.  2.  He underwent autologous stem cell transplantation on 01/30/2017.  He was started on Revlimid maintenance 10 mg daily in January 2019.  3.  Labs from January February and March 2019 done at [facility] showed no M spike, normal kappa lambda ratio of 1.06.  He was last seen by them in April 2019.  Following that patient was admitted to [facility] for healthcare associated pneumonia and was recently discharged.  He wished to transfer his care to [place] at this time.  With regards to his myeloma patient is currently on 10 mg of Revlimid daily.  He is also on acyclovir 400 mg twice daily which he is to continue for a minimum of 1 year post transplant.  He has also been advised by [facility] to continue aspirin 162 mg daily along with pantoprazole.  Plan was to start Zometa clearance.  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  He also gets OxyContin and oxycodone for his neuropathy through pain clinic.  He is on Zanaflex for his chronic back pain.  He also gets trazodone for his insomnia.  Patient presently reports significant fatigue since he has been discharged from the hospital.  States that he has run out of his pain medications and does not see pain clinic until 08/29/2017.  Reports tingling numbness in his extremities which is unchanged and chronic.  ECOG PS- 1  Pain scale- 4   Review of systems- Review of Systems  Constitutional: Positive for malaise/fatigue. Negative for chills, fever and weight loss.  HENT: Negative for congestion, ear discharge and nosebleeds.   Eyes: Negative for blurred vision.  Respiratory: Negative for cough, hemoptysis, sputum production, shortness of breath and wheezing.   Cardiovascular: Negative for chest pain, palpitations, orthopnea and claudication.  Gastrointestinal: Negative for abdominal pain, blood in stool, constipation, diarrhea, heartburn, melena, nausea and vomiting.  Genitourinary: Negative for dysuria, flank pain, frequency, hematuria and urgency.  Musculoskeletal: Negative for back pain, joint pain and myalgias.  Skin: Negative for rash.  Neurological: Positive for tingling. Negative for dizziness, focal weakness, seizures, weakness and headaches.  Endo/Heme/Allergies: Does not bruise/bleed easily.  Psychiatric/Behavioral: Negative for depression and suicidal ideas. The patient does not have insomnia.  No Known Allergies  Patient Active Problem List   Diagnosis Date Noted  . Severe sepsis ([place]) 08/11/2017  . Multifocal pneumonia 08/11/2017  . AKI (acute kidney injury) ([place]) 08/11/2017  . Hyponatremia 08/11/2017  . Multiple myeloma ([place]) 08/11/2017     Past Medical  History:  Diagnosis Date  . Cancer ([place])   . Hypertension   . Multiple myeloma ([place]) 08/11/2017     Past Surgical History:  Procedure Laterality Date  . ABDOMINAL SURGERY    . JOINT REPLACEMENT     right knee  . KNEE SURGERY Left     Social History   Socioeconomic History  . Marital status: Married    Spouse name: [name]  . Number of children: 3  . Years of education: Not on file  . Highest education level: Not on file  Occupational History  . Not on file  Social Needs  . Financial resource strain: Not hard at all  . Food insecurity:    Worry: Never true    Inability: Never true  . Transportation needs:    Medical: No    Non-medical: No  Tobacco Use  . Smoking status: Former Smoker    Last attempt to quit: 08/19/2013    Years since quitting: 4.0  . Smokeless tobacco: Current User    Types: Snuff  Substance and Sexual Activity  . Alcohol use: No  . Drug use: No  . Sexual activity: Yes  Lifestyle  . Physical activity:    Days per week: 7 days    Minutes per session: 30 min  . Stress: Only a little  Relationships  . Social connections:    Talks on phone: Once a week    Gets together: Once a week    Attends religious service: More than 4 times per year    Active member of club or organization: No    Attends meetings of clubs or organizations: Never    Relationship status: Married  . Intimate partner violence:    Fear of current or ex partner: No    Emotionally abused: No    Physically abused: No    Forced sexual activity: No  Other Topics Concern  . Not on file  Social History Narrative   Live in private residence with spouse and mother in law     Family History  Problem Relation Age of Onset  . Cancer Mother 59       Pancreatic  . Cancer Maternal Uncle        pancreatic  . Cancer Maternal Grandmother 31       colon     Current Outpatient Medications:  .  acyclovir (ZOVIRAX) 200 MG capsule, Take 400 mg by mouth 2 (two) times daily., Disp: ,  Rfl:  .  albuterol (PROVENTIL HFA;VENTOLIN HFA) 108 (90 Base) MCG/ACT inhaler, Inhale 2 puffs into the lungs every 6 (six) hours as needed for wheezing or shortness of breath., Disp: 1 Inhaler, Rfl: 0 .  aspirin EC 81 MG tablet, Take 81 mg by mouth daily., Disp: , Rfl:  .  buPROPion (WELLBUTRIN XL) 300 MG 24 hr tablet, Take 300 mg by mouth daily., Disp: , Rfl:  .  DULoxetine (CYMBALTA) 60 MG capsule, Take 60 mg by mouth daily., Disp: , Rfl:  .  escitalopram (LEXAPRO) 10 MG tablet, Take 10 mg by mouth daily., Disp: , Rfl:  .  gabapentin (NEURONTIN) 300 MG capsule, Take 600 mg by mouth 3 (three) times daily., Disp: , Rfl:  .  pantoprazole (PROTONIX) 40 MG tablet, Take 40 mg by mouth daily., Disp: , Rfl:  .  pregabalin (LYRICA) 150 MG capsule, Take 150 mg by mouth 2 (two) times daily., Disp: , Rfl:  .  prochlorperazine (COMPAZINE) 10 MG tablet, Take 10 mg by mouth every 6 (six) hours as needed for nausea or vomiting., Disp: , Rfl:  .  tiZANidine (ZANAFLEX) 4 MG tablet, Take 4 mg by mouth every 8 (eight) hours as needed for muscle spasms., Disp: , Rfl:  .  traZODone (DESYREL) 50 MG tablet, Take 50 mg by mouth at bedtime as needed for sleep., Disp: , Rfl:  .  Vitamin D, Ergocalciferol, (DRISDOL) 50000 units CAPS capsule, Take 50,000 Units by mouth every 7 (seven) days., Disp: , Rfl:  .  oxyCODONE (OXY IR/ROXICODONE) 5 MG immediate release tablet, Take 5 mg by mouth daily as needed for severe pain or breakthrough pain., Disp: , Rfl:  .  oxyCODONE ER (XTAMPZA ER) 13.5 MG C12A, Take 1 capsule by mouth 2 (two) times daily., Disp: , Rfl:    Physical exam:  Vitals:   08/19/17 1033 08/19/17 1043  BP:  130/83  Pulse:  87  Resp: 12   Temp:  97.7 F (36.5 C)  TempSrc:  Tympanic  Weight: 147 lb 1.6 oz (66.7 kg)   Height: _0  (1.702 m)    Physical Exam  Constitutional: He is oriented to person, place, and time.  Thin young gentleman in no acute distress  HENT:  Head: Normocephalic and atraumatic.    Poor dentition  Eyes: Pupils are equal, round, and reactive to light. EOM are normal.  Neck: Normal range of motion.  Cardiovascular: Normal rate, regular rhythm and normal heart sounds.  Pulmonary/Chest: Effort normal and breath sounds normal.  Abdominal: Soft. Bowel sounds are normal.  Neurological: He is alert and oriented to person, place, and time.  Skin: Skin is warm and dry.       CMP Latest Ref Rng & Units 08/19/2017  Glucose 65 - 99 mg/dL 97  BUN 6 - 20 mg/dL 11  Creatinine 0.61 - 1.24 mg/dL 1.68(H)  Sodium 135 - 145 mmol/L 139  Potassium 3.5 - 5.1 mmol/L 3.7  Chloride 101 - 111 mmol/L 104  CO2 22 - 32 mmol/L 26  Calcium 8.9 - 10.3 mg/dL 9.3  Total Protein 6.5 - 8.1 g/dL 7.4  Total Bilirubin 0.3 - 1.2 mg/dL 0.4  Alkaline Phos 38 - 126 U/L 131(H)  AST 15 - 41 U/L 21  ALT 17 - 63 U/L 21   CBC Latest Ref Rng & Units 08/19/2017  WBC 3.8 - 10.6 K/uL 5.0  Hemoglobin 13.0 - 18.0 g/dL 12.6(L)  Hematocrit 40.0 - 52.0 % 36.5(L)  Platelets 150 - 440 K/uL 299      Dg Chest Port 1 View  Result Date: 08/11/2017 CLINICAL DATA:  Febrile patient with history of multiple myeloma post recent stem cell transplant. EXAM: PORTABLE CHEST 1 VIEW COMPARISON:  05/29/2017 FINDINGS: Cardiomediastinal silhouette is normal. Mediastinal contours appear intact. There is no evidence of pleural effusion or pneumothorax. Vague patchy areas of airspace consolidation in the right mid lung field. Osseous structures are without acute abnormality. Soft tissues are grossly normal. IMPRESSION: Interval development of vague patchy areas of airspace consolidation the right mid lung field, concerning for early developing multifocal pneumonia. Electronically Signed   By: Fidela Salisbury M.D.   On: 08/11/2017 21:10    Assessment and plan- Patient is a 47 y.o. male with kappa  light chain multiple myeloma status post RVD chemotherapy followed by autologous stem cell transplantation in August 2018 currently in  remission and on maintenance Revlimid  1.  Multiple myeloma: He has 15 more days of Revlimid left and he will restart taking it at this time.  We will continue to refill his Revlimid from our end.  Plan per [facility] was to switch him to 3 weeks on 1 week off regimen if fatigue gets worse.  He is acute on chronic fatigue is likely secondary to recovery from his pneumonia.  However patient wishes to take Revlimid 3 weeks on 1 week on at this time and we will switch him to this regimen.  He will continue acyclovir 400 mg twice daily until at least October 2019.  He will also continue aspirin and pantoprazole.  Check CBC, CMP, multiple myeloma panel and serum free light chains as well as random urine protein electrophoresis today  2 chemo-induced peripheral neuropathy:.  I have advised him to stop his gabapentin since he is also on Lyrica 150 mg twice daily.  He gets Lyrica and his pain medication prescriptions including OxyContin and oxycodone from [name].  He will continue to follow-up with [facility] pain management and will get in touch with them for any further prescriptions of pain medications  3.  Chronic renal insufficiency: His baseline creatinine runs around 1.5 chronic back pain  4.:  Followed by pain clinic and he has been getting Zanaflex through them  5. Insomnia: Continue trazodone  6. We will get in touch with his dentist regarding dental clearance which is yet to be obtained.  We will proceed with Zometa following that  7.  Patient had acute anemia when he was admitted to the hospital likely secondary to his pneumonia.  Check ferritin and iron studies as well as B12 and folate today   8.  Required vaccinations posttransplant.  Patient is required to get pneumococcal, hepatitis B and Pentacel vaccine at 12 months 14 months and 18 months and Pneumovax 23 at 24 months.  I have advised him to continue to follow up with due for his posttransplant visits and get vaccinations through  them  May 27 see NP with labs ( cbc/d, met c, mm prof., kappa/ lambda chains)  June 25 see MD with labs ( cbc/d, met c, mm prof., kappa/ lambda chains) with possible zometa  > 45 minutes spent in reviewing outside records and face-to-face time with the patient coordinating his care    Visit Diagnosis 1. Multiple myeloma in remission ([place])   2. Chemotherapy-induced peripheral neuropathy ([place])   3. Chronic kidney disease, unspecified CKD stage     Dr. Randa Evens, MD, MPH [facility] [phone] 08/22/2017 1:37 PM

## 2017-08-23 ENCOUNTER — Emergency Department: Payer: Medicaid Other

## 2017-08-23 ENCOUNTER — Other Ambulatory Visit: Payer: Self-pay

## 2017-08-23 ENCOUNTER — Observation Stay
Admission: EM | Admit: 2017-08-23 | Discharge: 2017-08-26 | Disposition: A | Discharge status: Home / Self Care | Payer: Medicaid Other | Attending: Internal Medicine | Admitting: Internal Medicine

## 2017-08-23 DIAGNOSIS — D649 Anemia, unspecified: Secondary | ICD-10-CM | POA: Insufficient documentation

## 2017-08-23 DIAGNOSIS — E876 Hypokalemia: Secondary | ICD-10-CM | POA: Insufficient documentation

## 2017-08-23 DIAGNOSIS — E871 Hypo-osmolality and hyponatremia: Secondary | ICD-10-CM | POA: Diagnosis not present

## 2017-08-23 DIAGNOSIS — M25562 Pain in left knee: Secondary | ICD-10-CM | POA: Insufficient documentation

## 2017-08-23 DIAGNOSIS — Z7982 Long term (current) use of aspirin: Secondary | ICD-10-CM | POA: Insufficient documentation

## 2017-08-23 DIAGNOSIS — Z87891 Personal history of nicotine dependence: Secondary | ICD-10-CM | POA: Insufficient documentation

## 2017-08-23 DIAGNOSIS — N183 Chronic kidney disease, stage 3 unspecified: Secondary | ICD-10-CM | POA: Diagnosis present

## 2017-08-23 DIAGNOSIS — S01111A Laceration without foreign body of right eyelid and periocular area, initial encounter: Secondary | ICD-10-CM | POA: Diagnosis not present

## 2017-08-23 DIAGNOSIS — R55 Syncope and collapse: Secondary | ICD-10-CM | POA: Diagnosis present

## 2017-08-23 DIAGNOSIS — M25569 Pain in unspecified knee: Secondary | ICD-10-CM

## 2017-08-23 DIAGNOSIS — I129 Hypertensive chronic kidney disease with stage 1 through stage 4 chronic kidney disease, or unspecified chronic kidney disease: Secondary | ICD-10-CM | POA: Insufficient documentation

## 2017-08-23 DIAGNOSIS — I951 Orthostatic hypotension: Principal | ICD-10-CM | POA: Insufficient documentation

## 2017-08-23 DIAGNOSIS — I10 Essential (primary) hypertension: Secondary | ICD-10-CM | POA: Diagnosis not present

## 2017-08-23 DIAGNOSIS — N179 Acute kidney failure, unspecified: Secondary | ICD-10-CM | POA: Insufficient documentation

## 2017-08-23 DIAGNOSIS — S0181XA Laceration without foreign body of other part of head, initial encounter: Secondary | ICD-10-CM

## 2017-08-23 DIAGNOSIS — R002 Palpitations: Secondary | ICD-10-CM | POA: Insufficient documentation

## 2017-08-23 DIAGNOSIS — W19XXXA Unspecified fall, initial encounter: Secondary | ICD-10-CM | POA: Insufficient documentation

## 2017-08-23 DIAGNOSIS — C9000 Multiple myeloma not having achieved remission: Secondary | ICD-10-CM | POA: Insufficient documentation

## 2017-08-23 LAB — COMPREHENSIVE METABOLIC PANEL
ALK PHOS: 133 U/L — AB (ref 38–126)
ALT: 16 U/L — ABNORMAL LOW (ref 17–63)
ANION GAP: 7 (ref 5–15)
AST: 24 U/L (ref 15–41)
Albumin: 3.4 g/dL — ABNORMAL LOW (ref 3.5–5.0)
BUN: 20 mg/dL (ref 6–20)
CALCIUM: 9 mg/dL (ref 8.9–10.3)
CHLORIDE: 104 mmol/L (ref 101–111)
CO2: 24 mmol/L (ref 22–32)
Creatinine, Ser: 1.72 mg/dL — ABNORMAL HIGH (ref 0.61–1.24)
GFR calc non Af Amer: 46 mL/min — ABNORMAL LOW (ref >60)
GFR, EST AFRICAN AMERICAN: 53 mL/min — AB (ref >60)
Glucose, Bld: 103 mg/dL — ABNORMAL HIGH (ref 65–99)
Potassium: 3.4 mmol/L — ABNORMAL LOW (ref 3.5–5.1)
SODIUM: 135 mmol/L (ref 135–145)
Total Bilirubin: 0.5 mg/dL (ref 0.3–1.2)
Total Protein: 6.4 g/dL — ABNORMAL LOW (ref 6.5–8.1)

## 2017-08-23 LAB — URINALYSIS, COMPLETE (UACMP) WITH MICROSCOPIC
Bilirubin Urine: NEGATIVE
GLUCOSE, UA: NEGATIVE mg/dL
HGB URINE DIPSTICK: NEGATIVE
Ketones, ur: NEGATIVE mg/dL
Leukocytes, UA: NEGATIVE
NITRITE: NEGATIVE
PROTEIN: NEGATIVE mg/dL
Specific Gravity, Urine: 1.006 (ref 1.005–1.030)
pH: 7 (ref 5.0–8.0)

## 2017-08-23 LAB — CBC WITH DIFFERENTIAL/PLATELET
Basophils Absolute: 0.1 10*3/uL (ref 0–0.1)
Basophils Relative: 1 %
EOS ABS: 0.1 10*3/uL (ref 0–0.7)
EOS PCT: 1 %
HCT: 35.2 % — ABNORMAL LOW (ref 40.0–52.0)
Hemoglobin: 12.3 g/dL — ABNORMAL LOW (ref 13.0–18.0)
Lymphocytes Relative: 11 %
Lymphs Abs: 0.9 10*3/uL — ABNORMAL LOW (ref 1.0–3.6)
MCH: 32.7 pg (ref 26.0–34.0)
MCHC: 34.9 g/dL (ref 32.0–36.0)
MCV: 93.7 fL (ref 80.0–100.0)
MONO ABS: 0.9 10*3/uL (ref 0.2–1.0)
Monocytes Relative: 10 %
NEUTROS ABS: 6.4 10*3/uL (ref 1.4–6.5)
Neutrophils Relative %: 77 %
PLATELETS: 291 10*3/uL (ref 150–440)
RBC: 3.76 MIL/uL — ABNORMAL LOW (ref 4.40–5.90)
RDW: 15.8 % — AB (ref 11.5–14.5)
WBC: 8.2 10*3/uL (ref 3.8–10.6)

## 2017-08-23 LAB — TROPONIN I: Troponin I: <0.03 ng/mL (ref <0.03)

## 2017-08-23 MED ORDER — LIDOCAINE-EPINEPHRINE (PF) 1 %-1:200000 IJ SOLN
INTRAMUSCULAR | Status: AC
  Filled 2017-08-23: qty 30

## 2017-08-23 MED ORDER — LIDOCAINE-EPINEPHRINE 1 %-1:100000 IJ SOLN
10.0000 mL | Freq: Once | INTRAMUSCULAR | Status: AC
Start: 2017-08-23 — End: 2017-08-23
  Administered 2017-08-23: 10 mL via INTRADERMAL

## 2017-08-23 MED ORDER — HYDROMORPHONE HCL 1 MG/ML IJ SOLN
1.0000 mg | Freq: Once | INTRAMUSCULAR | Status: AC
  Administered 2017-08-23: 1 mg via INTRAVENOUS
  Filled 2017-08-23: qty 1

## 2017-08-23 NOTE — ED Triage Notes (Signed)
Pt last remembers standing while taking a drink of lemonade, then remembers falling back onto the floor.  Son found was in other room and heard fall and came to room. Pt was already back awake then. Lac over R eyebrow.

## 2017-08-23 NOTE — ED Provider Notes (Signed)
[facility] Emergency Department Provider Note       Time seen: ----------------------------------------- 10:44 PM on 08/23/2017 -----------------------------------------   I have reviewed the triage vital signs and the nursing notes.  HISTORY   Chief Complaint Fall    HPI James House. is a 47 y.o. male with a history of cancer, hypertension, multiple myeloma and sepsis who presents to the ED for syncope.  Patient states he walked inside to get a drink or lemonade and then he remembers falling back onto the floor.  His son heard the fall and came into the room, he was already back awake at that time.  He sustained a laceration to his right eyebrow.  He is complaining of head and neck pain and is seen brought in C-spine immobilized.  He denies fevers, chills, chest pain, shortness of breath.  During the event he felt like his heart was beating irregularly.  Past Medical History:  Diagnosis Date  . Cancer ([place])   . Hypertension   . Multiple myeloma ([place]) 08/11/2017    Patient Active Problem List   Diagnosis Date Noted  . Severe sepsis ([place]) 08/11/2017  . Multifocal pneumonia 08/11/2017  . AKI (acute kidney injury) ([place]) 08/11/2017  . Hyponatremia 08/11/2017  . Multiple myeloma ([place]) 08/11/2017    Past Surgical History:  Procedure Laterality Date  . ABDOMINAL SURGERY    . JOINT REPLACEMENT     right knee  . KNEE SURGERY Left     Allergies Patient has no known allergies.  Social History Social History   Tobacco Use  . Smoking status: Former Smoker    Last attempt to quit: 08/19/2013    Years since quitting: 4.0  . Smokeless tobacco: Current User    Types: Snuff  Substance Use Topics  . Alcohol use: No  . Drug use: No   Review of Systems Constitutional: Negative for fever. Cardiovascular: Negative for chest pain. Respiratory: Negative for shortness of breath. Gastrointestinal: Negative for abdominal pain, vomiting and  diarrhea. Musculoskeletal: Positive for neck pain Skin: Positive for right eyebrow laceration Neurological: Positive for headache  All systems negative/normal/unremarkable except as stated in the HPI  ____________________________________________   PHYSICAL EXAM:  VITAL SIGNS: ED Triage Vitals  Enc Vitals Group     BP 08/23/17 2156 133/90     Pulse Rate 08/23/17 2156 71     Resp --      Temp 08/23/17 2156 98 F (36.7 C)     Temp Source 08/23/17 2156 Oral     SpO2 08/23/17 2154 100 %     Weight 08/23/17 2159 147 lb (66.7 kg)     Height 08/23/17 2159 _0  (1.702 m)     Head Circumference --      Peak Flow --      Pain Score 08/23/17 2158 8     Pain Loc --      Pain Edu? --      Excl. in [place]? --    Constitutional: Alert and oriented. Well appearing and in no distress. Eyes: Conjunctivae are normal. Normal extraocular movements. ENT   Head: Normocephalic, 2 cm laceration vertically through the mid right eyebrow   Nose: No congestion/rhinnorhea.   Mouth/Throat: Mucous membranes are moist.   Neck: No stridor.  C-spine immobilized Cardiovascular: Normal rate, regular rhythm. No murmurs, rubs, or gallops. Respiratory: Normal respiratory effort without tachypnea nor retractions. Breath sounds are clear and equal bilaterally. No wheezes/rales/rhonchi. Gastrointestinal: Soft and nontender. Normal bowel sounds  Musculoskeletal: Nontender with normal range of motion in extremities. No lower extremity tenderness nor edema. Neurologic:  Normal speech and language. No gross focal neurologic deficits are appreciated.  Skin: 2 cm right eyebrow laceration Psychiatric: Mood and affect are normal. Speech and behavior are normal.  ____________________________________________  EKG: Interpreted by me.  Sinus rhythm rate 70 bpm, normal PR interval, normal QRS, normal QT.  ____________________________________________  ED COURSE:  As part of my medical decision making, I reviewed  the following data within the [place] History obtained from family if available, nursing notes, old chart and ekg, as well as notes from prior ED visits. Patient presented for syncope, we will assess with labs and imaging as indicated at this time.   Marland Kitchen.Laceration Repair Date/Time: 08/23/2017 11:08 PM Performed by: Earleen Newport, MD Authorized by: Earleen Newport, MD   Consent:    Consent obtained:  Verbal   Consent given by:  Patient Anesthesia (see MAR for exact dosages):    Anesthesia method:  Local infiltration   Local anesthetic:  Lidocaine 1% WITH epi Laceration details:    Location:  Face   Face location:  R eyebrow   Length (cm):  2.5   Depth (mm):  4 Repair type:    Repair type:  Simple Exploration:    Contaminated: no   Treatment:    Area cleansed with:  Betadine   Amount of cleaning:  Standard   Irrigation solution:  Sterile saline   Visualized foreign bodies/material removed: no   Skin repair:    Repair method:  Sutures   Suture size:  5-0   Suture material:  Nylon   Suture technique:  Running   Number of sutures:  6 Approximation:    Approximation:  Close Post-procedure details:    Dressing:  Open (no dressing)   Patient tolerance of procedure:  Tolerated well, no immediate complications   ____________________________________________   LABS (pertinent positives/negatives)  Labs Reviewed  CBC WITH DIFFERENTIAL/PLATELET - Abnormal; Notable for the following components:      Result Value   RBC 3.76 (*)    Hemoglobin 12.3 (*)    HCT 35.2 (*)    RDW 15.8 (*)    Lymphs Abs 0.9 (*)    All other components within normal limits  COMPREHENSIVE METABOLIC PANEL - Abnormal; Notable for the following components:   Potassium 3.4 (*)    Glucose, Bld 103 (*)    Creatinine, Ser 1.72 (*)    Total Protein 6.4 (*)    Albumin 3.4 (*)    ALT 16 (*)    Alkaline Phosphatase 133 (*)    GFR calc non Af Amer 46 (*)    GFR calc Af Amer 53  (*)    All other components within normal limits  TROPONIN I  URINALYSIS, COMPLETE (UACMP) WITH MICROSCOPIC  CBG MONITORING, ED    RADIOLOGY  CT head, C-spine Are unremarkable ____________________________________________  DIFFERENTIAL DIAGNOSIS   Syncope, arrhythmia, dehydration, electrolyte abnormality, subdural hematoma, skull fracture  FINAL ASSESSMENT AND PLAN  Syncope, right eyebrow laceration   Plan: The patient had presented for syncope. Patient's labs revealed no significant abnormality compared to his prior. Patient's imaging was normal.  No clear etiology for his syncope has been identified.  It is possible this is medication related.  He states he was feeling fine prior to this event and did have some palpitations.  I will recommend admission on telemetry for same.   Laurence Aly, MD  Note: This note was generated in part or whole with voice recognition software. Voice recognition is usually quite accurate but there are transcription errors that can and very often do occur. I apologize for any typographical errors that were not detected and corrected.     Earleen Newport, MD 08/23/17 [phone]

## 2017-08-24 ENCOUNTER — Encounter: Payer: Self-pay | Admitting: Emergency Medicine

## 2017-08-24 ENCOUNTER — Observation Stay (HOSPITAL_BASED_OUTPATIENT_CLINIC_OR_DEPARTMENT_OTHER)
Admit: 2017-08-24 | Discharge: 2017-08-24 | Disposition: A | Discharge status: Home / Self Care | Payer: Medicaid Other | Attending: Cardiovascular Disease | Admitting: Cardiovascular Disease

## 2017-08-24 ENCOUNTER — Other Ambulatory Visit: Payer: Self-pay

## 2017-08-24 DIAGNOSIS — N183 Chronic kidney disease, stage 3 (moderate): Secondary | ICD-10-CM | POA: Diagnosis not present

## 2017-08-24 DIAGNOSIS — C9000 Multiple myeloma not having achieved remission: Secondary | ICD-10-CM

## 2017-08-24 DIAGNOSIS — R55 Syncope and collapse: Secondary | ICD-10-CM | POA: Diagnosis not present

## 2017-08-24 DIAGNOSIS — I951 Orthostatic hypotension: Secondary | ICD-10-CM

## 2017-08-24 DIAGNOSIS — E860 Dehydration: Secondary | ICD-10-CM | POA: Diagnosis not present

## 2017-08-24 LAB — ECHOCARDIOGRAM COMPLETE
HEIGHTINCHES: 67 in
Weight: 2267.2 oz

## 2017-08-24 MED ORDER — ACETAMINOPHEN 325 MG PO TABS
650.0000 mg | ORAL_TABLET | Freq: Four times a day (QID) | ORAL | Status: DC | PRN
  Administered 2017-08-25: 650 mg via ORAL
  Filled 2017-08-24: qty 2

## 2017-08-24 MED ORDER — ACYCLOVIR 200 MG PO CAPS
200.0000 mg | ORAL_CAPSULE | Freq: Two times a day (BID) | ORAL | Status: DC
  Administered 2017-08-24 – 2017-08-26 (×6): 200 mg via ORAL
  Filled 2017-08-24 (×7): qty 1

## 2017-08-24 MED ORDER — HYDROMORPHONE HCL 1 MG/ML IJ SOLN
0.5000 mg | INTRAMUSCULAR | Status: DC | PRN
Start: 2017-08-24 — End: 2017-08-25
  Administered 2017-08-24 – 2017-08-25 (×4): 0.5 mg via INTRAVENOUS
  Filled 2017-08-24 (×4): qty 1

## 2017-08-24 MED ORDER — DULOXETINE HCL 30 MG PO CPEP
60.0000 mg | ORAL_CAPSULE | Freq: Every day | ORAL | Status: DC
  Administered 2017-08-24 – 2017-08-25 (×3): 60 mg via ORAL
  Filled 2017-08-24 (×3): qty 2

## 2017-08-24 MED ORDER — PREGABALIN 50 MG PO CAPS
150.0000 mg | ORAL_CAPSULE | Freq: Two times a day (BID) | ORAL | Status: DC
  Administered 2017-08-24 – 2017-08-26 (×6): 150 mg via ORAL
  Filled 2017-08-24 (×6): qty 3

## 2017-08-24 MED ORDER — ENOXAPARIN SODIUM 40 MG/0.4ML ~~LOC~~ SOLN
40.0000 mg | SUBCUTANEOUS | Status: DC
  Administered 2017-08-24 – 2017-08-25 (×2): 40 mg via SUBCUTANEOUS
  Filled 2017-08-24 (×2): qty 0.4

## 2017-08-24 MED ORDER — PANTOPRAZOLE SODIUM 40 MG PO TBEC
40.0000 mg | DELAYED_RELEASE_TABLET | Freq: Every day | ORAL | Status: DC
  Administered 2017-08-24 – 2017-08-26 (×3): 40 mg via ORAL
  Filled 2017-08-24 (×3): qty 1

## 2017-08-24 MED ORDER — TRAZODONE HCL 50 MG PO TABS
50.0000 mg | ORAL_TABLET | Freq: Every day | ORAL | Status: DC
  Administered 2017-08-24 – 2017-08-25 (×3): 50 mg via ORAL
  Filled 2017-08-24 (×3): qty 1

## 2017-08-24 MED ORDER — ONDANSETRON HCL 4 MG PO TABS
4.0000 mg | ORAL_TABLET | Freq: Four times a day (QID) | ORAL | Status: DC | PRN
  Filled 2017-08-24: qty 1

## 2017-08-24 MED ORDER — LENALIDOMIDE 10 MG PO CAPS: 20.0000 mg | ORAL_CAPSULE | Freq: Every day | ORAL | Status: DC

## 2017-08-24 MED ORDER — ACETAMINOPHEN 650 MG RE SUPP: 650.0000 mg | Freq: Four times a day (QID) | RECTAL | Status: DC | PRN

## 2017-08-24 MED ORDER — SODIUM CHLORIDE 0.9 % IV SOLN
INTRAVENOUS | Status: AC
  Administered 2017-08-24 – 2017-08-25 (×3): via INTRAVENOUS

## 2017-08-24 MED ORDER — ASPIRIN EC 81 MG PO TBEC
81.0000 mg | DELAYED_RELEASE_TABLET | Freq: Every day | ORAL | Status: DC
  Administered 2017-08-24 – 2017-08-26 (×3): 81 mg via ORAL
  Filled 2017-08-24 (×3): qty 1

## 2017-08-24 MED ORDER — OXYCODONE HCL ER 10 MG PO T12A
10.0000 mg | EXTENDED_RELEASE_TABLET | Freq: Two times a day (BID) | ORAL | Status: DC
  Administered 2017-08-24 – 2017-08-26 (×5): 10 mg via ORAL
  Filled 2017-08-24 (×5): qty 1

## 2017-08-24 MED ORDER — OXYCODONE HCL 5 MG PO TABS
5.0000 mg | ORAL_TABLET | Freq: Four times a day (QID) | ORAL | Status: DC | PRN
  Administered 2017-08-24 (×2): 5 mg via ORAL
  Filled 2017-08-24 (×2): qty 1

## 2017-08-24 MED ORDER — ONDANSETRON HCL 4 MG/2ML IJ SOLN: 4.0000 mg | Freq: Four times a day (QID) | INTRAMUSCULAR | Status: DC | PRN

## 2017-08-24 NOTE — Plan of Care (Signed)
  Problem: Clinical Measurements: Goal: Ability to maintain clinical measurements within normal limits will improve Outcome: Not Progressing Note:  Potassium = only 3.4 today. Will continue to monitor lab values. Wenda Low [facility]

## 2017-08-24 NOTE — H&P (Signed)
[facility] [place] NAME: James House    MR#:  [id number]  DATE OF BIRTH:  1970/06/19  DATE OF ADMISSION:  08/23/2017  PRIMARY CARE PHYSICIAN: Felicie Morn, PA-C   REQUESTING/REFERRING PHYSICIAN: Jimmye Norman, MD  CHIEF COMPLAINT:   Chief Complaint  Patient presents with  . Fall    HISTORY OF PRESENT ILLNESS:  James House  is a 47 y.o. male who presents with syncopal episode.  Patient states he was in his kitchen getting something to drink and had syncopal episode.  He states that he felt some palpitations in his chest just prior to the syncope, and felt like his "blood pressure was dropping".  He states this is happened once before, though that time was without palpitations.  Prior occurrence was about 3 months ago.  Here in the ED he was found to have a right eyebrow laceration from his fall, and work-up was otherwise largely within normal limits.  Hospitalist were called for admission and further evaluation  PAST MEDICAL HISTORY:   Past Medical History:  Diagnosis Date  . Cancer ([place])   . Hypertension   . Multiple myeloma ([place]) 08/11/2017     PAST SURGICAL HISTORY:   Past Surgical History:  Procedure Laterality Date  . ABDOMINAL SURGERY    . JOINT REPLACEMENT     right knee  . KNEE SURGERY Left      SOCIAL HISTORY:   Social History   Tobacco Use  . Smoking status: Former Smoker    Last attempt to quit: 08/19/2013    Years since quitting: 4.0  . Smokeless tobacco: Current User    Types: Snuff  Substance Use Topics  . Alcohol use: No     FAMILY HISTORY:   Family History  Problem Relation Age of Onset  . Cancer Mother 30       Pancreatic  . Cancer Maternal Uncle        pancreatic  . Cancer Maternal Grandmother 77       colon     DRUG ALLERGIES:  No Known Allergies  MEDICATIONS AT HOME:   Prior to Admission medications   Medication Sig Start Date End Date Taking? Authorizing Provider  acyclovir  (ZOVIRAX) 200 MG capsule Take 200 mg by mouth 2 (two) times daily.    Yes [provider]  aspirin EC 81 MG tablet Take 81 mg by mouth daily.   Yes [provider]  DULoxetine (CYMBALTA) 60 MG capsule Take 60 mg by mouth at bedtime.    Yes [provider]  lenalidomide (REVLIMID) 20 MG capsule Take 20 mg by mouth daily. Swallow whole.  Do not Crush.   Yes [provider]  oxyCODONE (OXY IR/ROXICODONE) 5 MG immediate release tablet Take 5 mg by mouth every 6 (six) hours as needed for severe pain or breakthrough pain.    Yes [provider]  oxyCODONE (OXYCONTIN) 10 mg 12 hr tablet Take 10 mg by mouth every 12 (twelve) hours.   Yes [provider]  pantoprazole (PROTONIX) 40 MG tablet Take 40 mg by mouth daily.   Yes [provider]  pregabalin (LYRICA) 150 MG capsule Take 150 mg by mouth 2 (two) times daily.   Yes [provider]  prochlorperazine (COMPAZINE) 10 MG tablet Take 10 mg by mouth every 6 (six) hours as needed for nausea or vomiting.   Yes [provider]  tiZANidine (ZANAFLEX) 4 MG tablet Take 4 mg by  mouth every 8 (eight) hours as needed for muscle spasms.   Yes [provider]  traZODone (DESYREL) 50 MG tablet Take 50 mg by mouth at bedtime.    Yes [provider]  Vitamin D, Ergocalciferol, (DRISDOL) 50000 units CAPS capsule Take 50,000 Units by mouth every 7 (seven) days. Takes on Monday   Yes [provider]  albuterol (PROVENTIL HFA;VENTOLIN HFA) 108 (90 Base) MCG/ACT inhaler Inhale 2 puffs into the lungs every 6 (six) hours as needed for wheezing or shortness of breath. Patient not taking: Reported on 08/24/2017 08/03/15   Menshew, Dannielle Karvonen, PA-C    REVIEW OF SYSTEMS:  Review of Systems  Constitutional: Negative for chills, fever, malaise/fatigue and weight loss.  HENT: Negative for ear pain, hearing loss and tinnitus.   Eyes: Negative for blurred vision, double vision,  pain and redness.  Respiratory: Negative for cough, hemoptysis and shortness of breath.   Cardiovascular: Negative for chest pain, palpitations, orthopnea and leg swelling.  Gastrointestinal: Negative for abdominal pain, constipation, diarrhea, nausea and vomiting.  Genitourinary: Negative for dysuria, frequency and hematuria.  Musculoskeletal: Negative for back pain, joint pain and neck pain.  Skin:       No acne, rash, or lesions  Neurological: Positive for loss of consciousness. Negative for dizziness, tremors, focal weakness and weakness.  Endo/Heme/Allergies: Negative for polydipsia. Does not bruise/bleed easily.  Psychiatric/Behavioral: Negative for depression. The patient is not nervous/anxious and does not have insomnia.      VITAL SIGNS:   Vitals:   08/23/17 2317 08/23/17 2330 08/24/17 0000 08/24/17 0030  BP: 127/84 119/80 111/76 112/76  Pulse: 77 77 83 73  Resp: '18 16 20 19  '$ Temp:      TempSrc:      SpO2: 99% 100% 100% 98%  Weight:      Height:       Wt Readings from Last 3 Encounters:  08/23/17 66.7 kg (147 lb)  08/19/17 66.7 kg (147 lb 1.6 oz)  08/13/17 68.4 kg (150 lb 12.8 oz)    PHYSICAL EXAMINATION:  Physical Exam  Vitals reviewed. Constitutional: He is oriented to person, place, and time. He appears well-developed and well-nourished. No distress.  HENT:  Head: Normocephalic and atraumatic.  Mouth/Throat: Oropharynx is clear and moist.  Eyes: Pupils are equal, round, and reactive to light. Conjunctivae and EOM are normal. No scleral icterus.  Neck: Normal range of motion. Neck supple. No JVD present. No thyromegaly present.  Cardiovascular: Normal rate, regular rhythm and intact distal pulses. Exam reveals no gallop and no friction rub.  No murmur heard. Respiratory: Effort normal and breath sounds normal. No respiratory distress. He has no wheezes. He has no rales.  GI: Soft. Bowel sounds are normal. He exhibits no distension. There is no tenderness.   Musculoskeletal: Normal range of motion. He exhibits no edema.  No arthritis, no gout  Lymphadenopathy:    He has no cervical adenopathy.  Neurological: He is alert and oriented to person, place, and time. No cranial nerve deficit.  No dysarthria, no aphasia  Skin: Skin is warm and dry. No rash noted. No erythema.  Repaired laceration over his right eye  Psychiatric: He has a normal mood and affect. His behavior is normal. Judgment and thought content normal.    LABORATORY PANEL:   CBC Recent Labs  Lab 08/23/17 2238  WBC 8.2  HGB 12.3*  HCT 35.2*  PLT 291   ------------------------------------------------------------------------------------------------------------------  Chemistries  Recent Labs  Lab 08/23/17 2238  NA 135  K 3.4*  CL 104  CO2 24  GLUCOSE 103*  BUN 20  CREATININE 1.72*  CALCIUM 9.0  AST 24  ALT 16*  ALKPHOS 133*  BILITOT 0.5   ------------------------------------------------------------------------------------------------------------------  Cardiac Enzymes Recent Labs  Lab 08/23/17 2238  TROPONINI <0.03   ------------------------------------------------------------------------------------------------------------------  RADIOLOGY:  Ct Head Wo Contrast  Result Date: 08/23/2017 CLINICAL DATA:  Facial laceration after fall. EXAM: CT HEAD WITHOUT CONTRAST CT CERVICAL SPINE WITHOUT CONTRAST TECHNIQUE: Multidetector CT imaging of the head and cervical spine was performed following the standard protocol without intravenous contrast. Multiplanar CT image reconstructions of the cervical spine were also generated. COMPARISON:  CT scan of May 12, 2015. FINDINGS: CT HEAD FINDINGS Brain: No evidence of acute infarction, hemorrhage, hydrocephalus, extra-axial collection or mass lesion/mass effect. Vascular: No hyperdense vessel or unexpected calcification. Skull: Normal. Negative for fracture or focal lesion. Sinuses/Orbits: No acute finding. Other: None.  CT CERVICAL SPINE FINDINGS Alignment: Normal. Skull base and vertebrae: No acute fracture. No primary bone lesion or focal pathologic process. Soft tissues and spinal canal: No prevertebral fluid or swelling. No visible canal hematoma. Disc levels:  Normal. Upper chest: Negative. Other: None. IMPRESSION: Normal head CT. Normal cervical spine. Electronically Signed   By: Marijo Conception, M.D.   On: 08/23/2017 22:35   Ct Cervical Spine Wo Contrast  Result Date: 08/23/2017 CLINICAL DATA:  Facial laceration after fall. EXAM: CT HEAD WITHOUT CONTRAST CT CERVICAL SPINE WITHOUT CONTRAST TECHNIQUE: Multidetector CT imaging of the head and cervical spine was performed following the standard protocol without intravenous contrast. Multiplanar CT image reconstructions of the cervical spine were also generated. COMPARISON:  CT scan of May 12, 2015. FINDINGS: CT HEAD FINDINGS Brain: No evidence of acute infarction, hemorrhage, hydrocephalus, extra-axial collection or mass lesion/mass effect. Vascular: No hyperdense vessel or unexpected calcification. Skull: Normal. Negative for fracture or focal lesion. Sinuses/Orbits: No acute finding. Other: None. CT CERVICAL SPINE FINDINGS Alignment: Normal. Skull base and vertebrae: No acute fracture. No primary bone lesion or focal pathologic process. Soft tissues and spinal canal: No prevertebral fluid or swelling. No visible canal hematoma. Disc levels:  Normal. Upper chest: Negative. Other: None. IMPRESSION: Normal head CT. Normal cervical spine. Electronically Signed   By: Marijo Conception, M.D.   On: 08/23/2017 22:35    EKG:   Orders placed or performed during the hospital encounter of 08/23/17  . ED EKG  . ED EKG    IMPRESSION AND PLAN:  Principal Problem:   Syncope -admit to telemetry with cardiac monitoring, troponin was negative, get echocardiogram and cardiology consult Active Problems:   Multiple myeloma ([place]) -continue home meds   HTN (hypertension) -continue  home medications   CKD (chronic kidney disease), stage III ([place]) -at baseline, monitor and avoid nephrotoxins  Chart review performed and case discussed with ED provider. Labs, imaging and/or ECG reviewed by provider and discussed with patient/family. Management plans discussed with the patient and/or family.  DVT PROPHYLAXIS: SubQ lovenox  GI PROPHYLAXIS: None  ADMISSION STATUS: Observation  CODE STATUS: Full Code Status History    Date Active Date Inactive Code Status Order ID Comments User Context   08/12/2017 0018 08/13/2017 1646 Full Code [id number]  Lance Coon, MD Inpatient      TOTAL TIME TAKING CARE OF THIS PATIENT: 40 minutes.   Farzad Tibbetts FIELDING 08/24/2017, 12:44 AM  [facility]  Office  [phone]  CC: Primary care physician; Felicie Morn, PA-C  Note:  This document  was prepared using Systems analyst and may include unintentional dictation errors.

## 2017-08-24 NOTE — Progress Notes (Signed)
*  PRELIMINARY RESULTS* Echocardiogram 2D Echocardiogram has been performed.  Lavell Luster Daltin Crist 08/24/2017, 3:23 PM

## 2017-08-24 NOTE — Progress Notes (Signed)
[facility] [place] NAME: James House    MR#:  [id number]  DATE OF BIRTH:  [birth year]  SUBJECTIVE:  CHIEF COMPLAINT: Patient is resting comfortably.  Denies any chest pain shortness of breath or dizziness.  Wife at bedside.  REVIEW OF SYSTEMS:  CONSTITUTIONAL: No fever, fatigue or weakness.  EYES: No blurred or double vision.  EARS, NOSE, AND THROAT: No tinnitus or ear pain.  RESPIRATORY: No cough, shortness of breath, wheezing or hemoptysis.  CARDIOVASCULAR: No chest pain, orthopnea, edema.  GASTROINTESTINAL: No nausea, vomiting, diarrhea or abdominal pain.  GENITOURINARY: No dysuria, hematuria.  ENDOCRINE: No polyuria, nocturia,  HEMATOLOGY: No anemia, easy bruising or bleeding SKIN: No rash or lesion. MUSCULOSKELETAL: No joint pain or arthritis.   NEUROLOGIC: No tingling, numbness, weakness.  PSYCHIATRY: No anxiety or depression.   DRUG ALLERGIES:  No Known Allergies  VITALS:  Blood pressure 102/67, pulse 70, temperature 97.9 F (36.6 C), temperature source Oral, resp. rate 14, height _0  (1.702 m), weight 64.3 kg (141 lb 11.2 oz), SpO2 100 %.  PHYSICAL EXAMINATION:  GENERAL:  47 y.o.-year-old patient lying in the bed with no acute distress.  EYES: Pupils equal, round, reactive to light and accommodation. No scleral icterus. Extraocular muscles intact.  Right eyebrow laceration status post repair with sutures HEENT: Head atraumatic, normocephalic. Oropharynx and nasopharynx clear.  NECK:  Supple, no jugular venous distention. No thyroid enlargement, no tenderness.  LUNGS: Normal breath sounds bilaterally, no wheezing, rales,rhonchi or crepitation. No use of accessory muscles of respiration.  CARDIOVASCULAR: S1, S2 normal. No murmurs, rubs, or gallops.  ABDOMEN: Soft, nontender, nondistended. Bowel sounds present. No organomegaly or mass.  EXTREMITIES: No pedal edema, cyanosis, or clubbing.  NEUROLOGIC: Cranial nerves II  through XII are intact. Muscle strength 5/5 in all extremities. Sensation intact. Gait not checked.  PSYCHIATRIC: The patient is alert and oriented x 3.  SKIN: No obvious rash, lesion, or ulcer.    LABORATORY PANEL:   CBC Recent Labs  Lab 08/23/17 2238  WBC 8.2  HGB 12.3*  HCT 35.2*  PLT 291   ------------------------------------------------------------------------------------------------------------------  Chemistries  Recent Labs  Lab 08/23/17 2238  NA 135  K 3.4*  CL 104  CO2 24  GLUCOSE 103*  BUN 20  CREATININE 1.72*  CALCIUM 9.0  AST 24  ALT 16*  ALKPHOS 133*  BILITOT 0.5   ------------------------------------------------------------------------------------------------------------------  Cardiac Enzymes Recent Labs  Lab 08/23/17 2238  TROPONINI <0.03   ------------------------------------------------------------------------------------------------------------------  RADIOLOGY:  Ct Head Wo Contrast  Result Date: 08/23/2017 CLINICAL DATA:  Facial laceration after fall. EXAM: CT HEAD WITHOUT CONTRAST CT CERVICAL SPINE WITHOUT CONTRAST TECHNIQUE: Multidetector CT imaging of the head and cervical spine was performed following the standard protocol without intravenous contrast. Multiplanar CT image reconstructions of the cervical spine were also generated. COMPARISON:  CT scan of May 12, 2015. FINDINGS: CT HEAD FINDINGS Brain: No evidence of acute infarction, hemorrhage, hydrocephalus, extra-axial collection or mass lesion/mass effect. Vascular: No hyperdense vessel or unexpected calcification. Skull: Normal. Negative for fracture or focal lesion. Sinuses/Orbits: No acute finding. Other: None. CT CERVICAL SPINE FINDINGS Alignment: Normal. Skull base and vertebrae: No acute fracture. No primary bone lesion or focal pathologic process. Soft tissues and spinal canal: No prevertebral fluid or swelling. No visible canal hematoma. Disc levels:  Normal. Upper chest:  Negative. Other: None. IMPRESSION: Normal head CT. Normal cervical spine. Electronically Signed   By: Marijo Conception, M.D.   On:  08/23/2017 22:35   Ct Cervical Spine Wo Contrast  Result Date: 08/23/2017 CLINICAL DATA:  Facial laceration after fall. EXAM: CT HEAD WITHOUT CONTRAST CT CERVICAL SPINE WITHOUT CONTRAST TECHNIQUE: Multidetector CT imaging of the head and cervical spine was performed following the standard protocol without intravenous contrast. Multiplanar CT image reconstructions of the cervical spine were also generated. COMPARISON:  CT scan of May 12, 2015. FINDINGS: CT HEAD FINDINGS Brain: No evidence of acute infarction, hemorrhage, hydrocephalus, extra-axial collection or mass lesion/mass effect. Vascular: No hyperdense vessel or unexpected calcification. Skull: Normal. Negative for fracture or focal lesion. Sinuses/Orbits: No acute finding. Other: None. CT CERVICAL SPINE FINDINGS Alignment: Normal. Skull base and vertebrae: No acute fracture. No primary bone lesion or focal pathologic process. Soft tissues and spinal canal: No prevertebral fluid or swelling. No visible canal hematoma. Disc levels:  Normal. Upper chest: Negative. Other: None. IMPRESSION: Normal head CT. Normal cervical spine. Electronically Signed   By: Marijo Conception, M.D.   On: 08/23/2017 22:35    EKG:   Orders placed or performed during the hospital encounter of 08/23/17  . ED EKG  . ED EKG    ASSESSMENT AND PLAN:    Syncope -from orthostatic hypotension  Hydrate with IV fluids, check orthostatics in a.m. monitor on telemetry with cardiac monitoring,  troponin was negative  get echocardiogram  cardiology consult  Hypokalemia replete and recheck in a.m. check magnesium also  Right eyebrow laceration status post repair Continue wound care and pain management as needed    Multiple myeloma ([place]) -continue home meds sees Dr. Janese Banks as an outpatient    HTN (hypertension) -not on any home medication.   Patient used to take amlodipine which was discontinued by the primary care physician lately    CKD (chronic kidney disease), stage III ([place]) -at baseline, monitor and avoid nephrotoxins      All the records are reviewed and case discussed with Care Management/Social Workerr. Management plans discussed with the patient, family and they are in agreement.  CODE STATUS: fc   TOTAL TIME TAKING CARE OF THIS PATIENT: 33  minutes.   POSSIBLE D/C IN 1 DAYS, DEPENDING ON CLINICAL CONDITION.  Note: This dictation was prepared with Dragon dictation along with smaller phrase technology. Any transcriptional errors that result from this process are unintentional.   Nicholes Mango M.D on 08/24/2017 at 2:06 PM  Between 7am to 6pm - Pager - [phone] After 6pm go to [email or web address] - password [email or web address] [facility]  Office  [phone]  CC: Primary care physician; Felicie Morn, PA-C

## 2017-08-24 NOTE — ED Notes (Signed)
April RN, aware of bed assigned   

## 2017-08-24 NOTE — Consult Note (Signed)
Cardiology Consultation:   Patient ID: James House.; [id number]; October 24, 1970   Admit date: 08/23/2017 Date of Consult: 08/24/2017  Primary Care Provider: Felicie Morn, PA-C Primary Cardiologist: New to [facility] Physician requesting consult:  Dr. Jannifer Franklin Reason for consult: Syncope   Patient Profile:   James House. is a 47 y.o. male with a hx of myeloma, anemia,  recent hospital admission for pneumonia, presenting after episode of syncope  History of Present Illness:   James House reports he was in his usual state of health yesterday Had a little bit to drink in the morning, went shopping several times throughout the day, reports that he went to [employer] and [employer] during the day but did not drink anything, only picked up drinks for his family, got home late in the day got out of the car walked into the house , walked up to their bar to drink some lemonade had several sips felt acutely dizzy.  Next thing he remembers he was laying on the ground  son heard him fall to the ground and called EMS.  He is not on any blood pressure medication Laceration to right forehead above his eye, eyebrow Notes indicating he felt palpitations but he denies feeling any palpitations prior to his syncope  Reports having somewhat of a similar episode 3 months ago but only had near syncope with no syncope  This morning was dizzy getting out of bed to go to the bathroom Orthostatics were checked Blood pressure 110 in bed, 99 with sitting, 74 systolic standing Started on IV fluids     Past Medical History:  Diagnosis Date  . Cancer ([place])   . Hypertension   . Multiple myeloma ([place]) 08/11/2017    Past Surgical History:  Procedure Laterality Date  . ABDOMINAL SURGERY    . JOINT REPLACEMENT     right knee  . KNEE SURGERY Left      Home Medications:  Prior to Admission medications   Medication Sig Start Date End Date Taking? Authorizing Provider  acyclovir (ZOVIRAX) 200 MG capsule Take 200 mg  by mouth 2 (two) times daily.    Yes [provider]  aspirin EC 81 MG tablet Take 81 mg by mouth daily.   Yes [provider]  DULoxetine (CYMBALTA) 60 MG capsule Take 60 mg by mouth at bedtime.    Yes [provider]  lenalidomide (REVLIMID) 20 MG capsule Take 20 mg by mouth daily. Swallow whole.  Do not Crush.   Yes [provider]  oxyCODONE (OXY IR/ROXICODONE) 5 MG immediate release tablet Take 5 mg by mouth every 6 (six) hours as needed for severe pain or breakthrough pain.    Yes [provider]  oxyCODONE (OXYCONTIN) 10 mg 12 hr tablet Take 10 mg by mouth every 12 (twelve) hours.   Yes [provider]  pantoprazole (PROTONIX) 40 MG tablet Take 40 mg by mouth daily.   Yes [provider]  pregabalin (LYRICA) 150 MG capsule Take 150 mg by mouth 2 (two) times daily.   Yes [provider]  prochlorperazine (COMPAZINE) 10 MG tablet Take 10 mg by mouth every 6 (six) hours as needed for nausea or vomiting.   Yes [provider]  tiZANidine (ZANAFLEX) 4 MG tablet Take 4 mg by mouth every 8 (eight) hours as needed for muscle spasms.   Yes [provider]  traZODone (DESYREL) 50 MG tablet Take 50 mg by mouth at bedtime.    Yes  [provider]  Vitamin D, Ergocalciferol, (DRISDOL) 50000 units CAPS capsule Take 50,000 Units by mouth every 7 (seven) days. Takes on Monday   Yes [provider]  albuterol (PROVENTIL HFA;VENTOLIN HFA) 108 (90 Base) MCG/ACT inhaler Inhale 2 puffs into the lungs every 6 (six) hours as needed for wheezing or shortness of breath. Patient not taking: Reported on 08/24/2017 08/03/15   Menshew, Dannielle Karvonen, PA-C    Inpatient Medications: Scheduled Meds: . acyclovir  200 mg Oral BID  . aspirin EC  81 mg Oral Daily  . DULoxetine  60 mg Oral QHS  . enoxaparin (LOVENOX) injection  40 mg Subcutaneous Q24H  . lenalidomide  20 mg Oral Daily  . oxyCODONE  10 mg Oral Q12H  .  pantoprazole  40 mg Oral Daily  . pregabalin  150 mg Oral BID  . traZODone  50 mg Oral QHS   Continuous Infusions: . sodium chloride 125 mL/hr at 08/24/17 1124   PRN Meds: acetaminophen **OR** acetaminophen, HYDROmorphone (DILAUDID) injection, ondansetron **OR** ondansetron (ZOFRAN) IV, oxyCODONE  Allergies:   No Known Allergies  Social History:   Social History   Socioeconomic History  . Marital status: Married    Spouse name: [name]  . Number of children: 3  . Years of education: Not on file  . Highest education level: Not on file  Occupational History  . Not on file  Social Needs  . Financial resource strain: Not hard at all  . Food insecurity:    Worry: Never true    Inability: Never true  . Transportation needs:    Medical: No    Non-medical: No  Tobacco Use  . Smoking status: Former Smoker    Last attempt to quit: 08/19/2013    Years since quitting: 4.0  . Smokeless tobacco: Current User    Types: Snuff  Substance and Sexual Activity  . Alcohol use: No  . Drug use: No  . Sexual activity: Yes  Lifestyle  . Physical activity:    Days per week: 7 days    Minutes per session: 30 min  . Stress: Only a little  Relationships  . Social connections:    Talks on phone: Once a week    Gets together: Once a week    Attends religious service: More than 4 times per year    Active member of club or organization: No    Attends meetings of clubs or organizations: Never    Relationship status: Married  . Intimate partner violence:    Fear of current or ex partner: No    Emotionally abused: No    Physically abused: No    Forced sexual activity: No  Other Topics Concern  . Not on file  Social History Narrative   Live in private residence with spouse and mother in law    Family History:    Family History  Problem Relation Age of Onset  . Cancer Mother 81       Pancreatic  . Cancer Maternal Uncle        pancreatic  . Cancer Maternal Grandmother 53       colon       ROS:  Please see the history of present illness.  Review of Systems  Constitutional: Negative.   Respiratory: Negative.   Cardiovascular: Negative.   Gastrointestinal: Negative.   Musculoskeletal: Negative.   Neurological: Positive for dizziness and loss of consciousness.  Psychiatric/Behavioral: Negative.   All other systems reviewed and are negative.  Physical Exam/Data:   Vitals:   08/24/17 0136 08/24/17 0138 08/24/17 0139 08/24/17 0857  BP: 114/81 104/80 (!) 78/56 102/67  Pulse: 78 72 92 70  Resp: 18   14  Temp:    97.9 F (36.6 C)  TempSrc:    Oral  SpO2: 100% 100% (!) 83% 100%  Weight:      Height:        Intake/Output Summary (Last 24 hours) at 08/24/2017 1604 Last data filed at 08/24/2017 1101 Gross per 24 hour  Intake -  Output 950 ml  Net -950 ml   Filed Weights   08/23/17 2159 08/24/17 0134  Weight: 147 lb (66.7 kg) 141 lb 11.2 oz (64.3 kg)   Body mass index is 22.19 kg/m.  General:  Well nourished, well developed, in no acute distress HEENT: normal Lymph: no adenopathy Neck: no JVD Endocrine:  No thryomegaly Vascular: No carotid bruits; FA pulses 2+ bilaterally without bruits  Cardiac:  normal S1, S2; RRR; no murmur  Lungs:  clear to auscultation bilaterally, no wheezing, rhonchi or rales  Abd: soft, nontender, no hepatomegaly  Ext: no edema Musculoskeletal:  No deformities, BUE and BLE strength normal and equal Skin: warm and dry  Neuro:  CNs 2-12 intact, no focal abnormalities noted Psych:  Normal affect   EKG:  The EKG was personally reviewed and demonstrates: Normal sinus rhythm rate 70 bpm no significant ST or T wave changes Telemetry:  Telemetry was personally reviewed and demonstrates:  Normal sinus rhythm  Relevant CV Studies:  Echocardiogram performed today  Normal LV function greater than 55% ejection fraction No valvular disease IVC is not full, collapses  Laboratory Data:  Chemistry Recent Labs  Lab 08/19/17 1127  08/23/17 2238  NA 139 135  K 3.7 3.4*  CL 104 104  CO2 26 24  GLUCOSE 97 103*  BUN 11 20  CREATININE 1.68* 1.72*  CALCIUM 9.3 9.0  GFRNONAA 47* 46*  GFRAA 55* 53*  ANIONGAP 9 7    Recent Labs  Lab 08/19/17 1127 08/23/17 2238  PROT 7.4 6.4*  ALBUMIN 3.6 3.4*  AST 21 24  ALT 21 16*  ALKPHOS 131* 133*  BILITOT 0.4 0.5   Hematology Recent Labs  Lab 08/19/17 1127 08/23/17 2238  WBC 5.0 8.2  RBC 3.86* 3.76*  HGB 12.6* 12.3*  HCT 36.5* 35.2*  MCV 94.7 93.7  MCH 32.6 32.7  MCHC 34.4 34.9  RDW 15.2* 15.8*  PLT 299 291   Cardiac Enzymes Recent Labs  Lab 08/23/17 2238  TROPONINI <0.03   No results for input(s): TROPIPOC in the last 168 hours.  BNPNo results for input(s): BNP, PROBNP in the last 168 hours.  DDimer No results for input(s): DDIMER in the last 168 hours.  Radiology/Studies:  Ct Head Wo Contrast  Result Date: 08/23/2017 CLINICAL DATA:  Facial laceration after fall. EXAM: CT HEAD WITHOUT CONTRAST CT CERVICAL SPINE WITHOUT CONTRAST TECHNIQUE: Multidetector CT imaging of the head and cervical spine was performed following the standard protocol without intravenous contrast. Multiplanar CT image reconstructions of the cervical spine were also generated. COMPARISON:  CT scan of May 12, 2015. FINDINGS: CT HEAD FINDINGS Brain: No evidence of acute infarction, hemorrhage, hydrocephalus, extra-axial collection or mass lesion/mass effect. Vascular: No hyperdense vessel or unexpected calcification. Skull: Normal. Negative for fracture or focal lesion. Sinuses/Orbits: No acute finding. Other: None. CT CERVICAL SPINE FINDINGS Alignment: Normal. Skull base and vertebrae: No acute fracture. No primary bone lesion or focal pathologic process. Soft  tissues and spinal canal: No prevertebral fluid or swelling. No visible canal hematoma. Disc levels:  Normal. Upper chest: Negative. Other: None. IMPRESSION: Normal head CT. Normal cervical spine. Electronically Signed   By: Marijo Conception, M.D.   On: 08/23/2017 22:35   Ct Cervical Spine Wo Contrast  Result Date: 08/23/2017 CLINICAL DATA:  Facial laceration after fall. EXAM: CT HEAD WITHOUT CONTRAST CT CERVICAL SPINE WITHOUT CONTRAST TECHNIQUE: Multidetector CT imaging of the head and cervical spine was performed following the standard protocol without intravenous contrast. Multiplanar CT image reconstructions of the cervical spine were also generated. COMPARISON:  CT scan of May 12, 2015. FINDINGS: CT HEAD FINDINGS Brain: No evidence of acute infarction, hemorrhage, hydrocephalus, extra-axial collection or mass lesion/mass effect. Vascular: No hyperdense vessel or unexpected calcification. Skull: Normal. Negative for fracture or focal lesion. Sinuses/Orbits: No acute finding. Other: None. CT CERVICAL SPINE FINDINGS Alignment: Normal. Skull base and vertebrae: No acute fracture. No primary bone lesion or focal pathologic process. Soft tissues and spinal canal: No prevertebral fluid or swelling. No visible canal hematoma. Disc levels:  Normal. Upper chest: Negative. Other: None. IMPRESSION: Normal head CT. Normal cervical spine. Electronically Signed   By: Marijo Conception, M.D.   On: 08/23/2017 22:35    Assessment and Plan:   1. Syncope Secondary to orthostasis Blood pressure down to 74 systolic with standing this morning Reports he did not drink anything yesterday Was out shopping, in the hot sun Possible prior similar episode 3 months ago with near syncope, no syncope Echocardiogram essentially normal with good ejection fraction greater than 55% Cardiac enzymes negative, EKG benign No arrhythmia on telemetry so far Given traumatic orthostatic numbers, agree with IV fluids Encouraged him to push p.o. Fluids Lifestyle changes, will need to drink more during the daytime-starting in the morning No further cardiac work-up needed at this time  2') myeloma Managed by oncology Currently on Revlimid  3) chronic kidney  disease Baseline creatinine 1.5 Creatinine this admission 1.7 with mildly elevated BUN 20  4) anemia Stable  long discussion with family at the bedside, wife and with patient concerning above  Total encounter time more than 110 minutes  Greater than 50% was spent in counseling and coordination of care with the patient   For questions or updates, please contact [place] Please consult [email or web address] for contact info under Cardiology/STEMI.   Signed, Ida Rogue, MD  08/24/2017 4:04 PM

## 2017-08-24 NOTE — Progress Notes (Signed)
Family Meeting Note  Advance Directive:yes  Today a meeting took place with the Patient, spouse at bedside  Patient is able to participate in the family meeting The following clinical team members were present during this meeting:MD  The following were discussed:Patient's diagnosis: Syncope, orthostatic hypotension, multiple myeloma, AKI, treatment plan of care discussed in detail with the patient and his wife at bedside.  They both verbalized understanding of the plan.   Patient's progosis: Unable to determine and Goals for treatment: Full Code, wife James House is a healthcare power of attorney  Additional follow-up to be provided: Hospitalist and cardiology  Time spent during discussion:42mn  ANicholes Mango MD

## 2017-08-25 ENCOUNTER — Observation Stay: Payer: Medicaid Other

## 2017-08-25 LAB — MAGNESIUM: MAGNESIUM: 2 mg/dL (ref 1.7–2.4)

## 2017-08-25 LAB — CREATININE, SERUM
Creatinine, Ser: 1.62 mg/dL — ABNORMAL HIGH (ref 0.61–1.24)
GFR calc non Af Amer: 49 mL/min — ABNORMAL LOW (ref >60)
GFR, EST AFRICAN AMERICAN: 57 mL/min — AB (ref >60)

## 2017-08-25 MED ORDER — SODIUM CHLORIDE 0.9 % IV SOLN
INTRAVENOUS | Status: AC
  Administered 2017-08-25 – 2017-08-26 (×3): via INTRAVENOUS

## 2017-08-25 MED ORDER — OXYCODONE HCL 5 MG PO TABS
5.0000 mg | ORAL_TABLET | Freq: Four times a day (QID) | ORAL | Status: DC | PRN
  Administered 2017-08-25 – 2017-08-26 (×4): 10 mg via ORAL
  Filled 2017-08-25 (×4): qty 2

## 2017-08-25 NOTE — Progress Notes (Signed)
[place] [facility] [place] NAME: James House    MR#:  [id number]  DATE OF BIRTH:  Sep 21, 1970  SUBJECTIVE:  CHIEF COMPLAINT: Patient is resting comfortably.  Denies any chest pain shortness of breath .  Reporting dizziness feels very tired today,.  Patient is reporting left knee pain with swelling from the fall  REVIEW OF SYSTEMS:  CONSTITUTIONAL: No fever, fatigue or weakness.  EYES: No blurred or double vision.  EARS, NOSE, AND THROAT: No tinnitus or ear pain.  RESPIRATORY: No cough, shortness of breath, wheezing or hemoptysis.  CARDIOVASCULAR: No chest pain, orthopnea, edema.  GASTROINTESTINAL: No nausea, vomiting, diarrhea or abdominal pain.  GENITOURINARY: No dysuria, hematuria.  ENDOCRINE: No polyuria, nocturia,  HEMATOLOGY: No anemia, easy bruising or bleeding SKIN: No rash or lesion. MUSCULOSKELETAL: Left knee is painful and swollen left knee is tender no joint pain or arthritis.   NEUROLOGIC: No tingling, numbness, weakness.  PSYCHIATRY: No anxiety or depression.   DRUG ALLERGIES:  No Known Allergies  VITALS:  Blood pressure 120/75, pulse (!) 108, temperature 97.8 F (36.6 C), temperature source Oral, resp. rate 17, height '5\' 7"'  (1.702 m), weight 64.3 kg (141 lb 11.2 oz), SpO2 100 %.  PHYSICAL EXAMINATION:  GENERAL:  47 y.o.-year-old patient lying in the bed with no acute distress.  EYES: Pupils equal, round, reactive to light and accommodation. No scleral icterus. Extraocular muscles intact.  Right eyebrow laceration status post repair with sutures HEENT: Head atraumatic, normocephalic. Oropharynx and nasopharynx clear.  NECK:  Supple, no jugular venous distention. No thyroid enlargement, no tenderness.  LUNGS: Normal breath sounds bilaterally, no wheezing, rales,rhonchi or crepitation. No use of accessory muscles of respiration.  CARDIOVASCULAR: S1, S2 normal. No murmurs, rubs, or gallops.  ABDOMEN: Soft, nontender,  nondistended. Bowel sounds present. No organomegaly or mass.  EXTREMITIES: Left knee tender edematous ,no erythema, no pedal edema, cyanosis, or clubbing.  NEUROLOGIC: Cranial nerves II through XII are intact. Muscle strength 5/5 in all extremities. Sensation intact. Gait not checked.  PSYCHIATRIC: The patient is alert and oriented x 3.  SKIN: No obvious rash, lesion, or ulcer.    LABORATORY PANEL:   CBC Recent Labs  Lab 08/23/17 2238  WBC 8.2  HGB 12.3*  HCT 35.2*  PLT 291   ------------------------------------------------------------------------------------------------------------------  Chemistries  Recent Labs  Lab 08/23/17 2238 08/25/17 0404  NA 135  --   K 3.4*  --   CL 104  --   CO2 24  --   GLUCOSE 103*  --   BUN 20  --   CREATININE 1.72* 1.62*  CALCIUM 9.0  --   MG  --  2.0  AST 24  --   ALT 16*  --   ALKPHOS 133*  --   BILITOT 0.5  --    ------------------------------------------------------------------------------------------------------------------  Cardiac Enzymes Recent Labs  Lab 08/23/17 2238  TROPONINI <0.03   ------------------------------------------------------------------------------------------------------------------  RADIOLOGY:  Ct Head Wo Contrast  Result Date: 08/23/2017 CLINICAL DATA:  Facial laceration after fall. EXAM: CT HEAD WITHOUT CONTRAST CT CERVICAL SPINE WITHOUT CONTRAST TECHNIQUE: Multidetector CT imaging of the head and cervical spine was performed following the standard protocol without intravenous contrast. Multiplanar CT image reconstructions of the cervical spine were also generated. COMPARISON:  CT scan of May 12, 2015. FINDINGS: CT HEAD FINDINGS Brain: No evidence of acute infarction, hemorrhage, hydrocephalus, extra-axial collection or mass lesion/mass effect. Vascular: No hyperdense vessel or unexpected calcification. Skull: Normal. Negative for fracture or  focal lesion. Sinuses/Orbits: No acute finding. Other: None.  CT CERVICAL SPINE FINDINGS Alignment: Normal. Skull base and vertebrae: No acute fracture. No primary bone lesion or focal pathologic process. Soft tissues and spinal canal: No prevertebral fluid or swelling. No visible canal hematoma. Disc levels:  Normal. Upper chest: Negative. Other: None. IMPRESSION: Normal head CT. Normal cervical spine. Electronically Signed   By: Marijo Conception, M.D.   On: 08/23/2017 22:35   Ct Cervical Spine Wo Contrast  Result Date: 08/23/2017 CLINICAL DATA:  Facial laceration after fall. EXAM: CT HEAD WITHOUT CONTRAST CT CERVICAL SPINE WITHOUT CONTRAST TECHNIQUE: Multidetector CT imaging of the head and cervical spine was performed following the standard protocol without intravenous contrast. Multiplanar CT image reconstructions of the cervical spine were also generated. COMPARISON:  CT scan of May 12, 2015. FINDINGS: CT HEAD FINDINGS Brain: No evidence of acute infarction, hemorrhage, hydrocephalus, extra-axial collection or mass lesion/mass effect. Vascular: No hyperdense vessel or unexpected calcification. Skull: Normal. Negative for fracture or focal lesion. Sinuses/Orbits: No acute finding. Other: None. CT CERVICAL SPINE FINDINGS Alignment: Normal. Skull base and vertebrae: No acute fracture. No primary bone lesion or focal pathologic process. Soft tissues and spinal canal: No prevertebral fluid or swelling. No visible canal hematoma. Disc levels:  Normal. Upper chest: Negative. Other: None. IMPRESSION: Normal head CT. Normal cervical spine. Electronically Signed   By: Marijo Conception, M.D.   On: 08/23/2017 22:35   Dg Knee Complete 4 Views Left  Result Date: 08/25/2017 CLINICAL DATA:  Left knee pain. Syncopal episode. Post syncope left knee pain. History of left partial joint replacement 3 years ago. EXAM: LEFT KNEE - COMPLETE 4+ VIEW COMPARISON:  AP and lateral views of the left knee of May 12, 2015 FINDINGS: The bones are subjectively adequately mineralized. No acute  native bone fracture is observed. The prosthesis in the medial compartment appears intact and appropriately positioned. The lateral and patellofemoral joint spaces are reasonably well-maintained. The soft tissues of the knee are unremarkable. IMPRESSION: There is no acute or significant chronic bony abnormality of the left knee. The prosthetic medial joint compartment structures appear intact and appropriately positioned. Electronically Signed   By: David  Martinique M.D.   On: 08/25/2017 14:35    EKG:   Orders placed or performed during the hospital encounter of 08/23/17  . ED EKG  . ED EKG    ASSESSMENT AND PLAN:    Syncope -from orthostatic hypotension  Hydrate with IV fluids, check orthostatics in a.m. again as patient is reporting dizziness  monitor on telemetry with cardiac monitoring,  troponin was negative  echocardiogram -normal with good ejection fraction cardiology consult placed and seen by Dr. Rockey Situ recommending hydration with fluids and no further cardiac work-up needed-   Acute left knee pain from the fall Knee x-ray looks fine.  Apply ice and pain medication as needed PT consult placed   Hypokalemia replete and recheck in a.m.  Magnesium at 2.0  Right eyebrow laceration status post repair Continue wound care and pain management as needed    Multiple myeloma ([place]) -continue home meds sees Dr. Janese Banks as an outpatient    HTN (hypertension) -not on any home medication.  Patient used to take amlodipine which was discontinued by the primary care physician lately   AKI on CKD (chronic kidney disease), stage III ([place]) -at baseline, monitor and avoid nephrotoxins.  Creatinine 1.72 at the time of admission today it is at 1.62 Baseline creatinine 1.5  All the records are reviewed and case discussed with Care Management/Social Workerr. Management plans discussed with the patient, family and they are in agreement.  CODE STATUS: fc   TOTAL TIME TAKING CARE OF THIS  PATIENT: 33  minutes.   POSSIBLE D/C IN 1 DAYS, DEPENDING ON CLINICAL CONDITION.  Note: This dictation was prepared with Dragon dictation along with smaller phrase technology. Any transcriptional errors that result from this process are unintentional.   Nicholes Mango M.D on 08/25/2017 at 3:40 PM  Between 7am to 6pm - Pager - [phone] After 6pm go to [email or web address] - password [email or web address] [facility]  Office  [phone]  CC: Primary care physician; Felicie Morn, PA-C

## 2017-08-25 NOTE — Progress Notes (Signed)
MD made aware of + orthostatic VS

## 2017-08-25 NOTE — Progress Notes (Signed)
   08/25/17 1800  Clinical Encounter Type  Visited With Patient not available  Visit Type Initial  Referral From Nurse  Consult/Referral To Chaplain   Chaplain responded to RRT.  No family present.  Chaplain services not needed at this time.  Arabela Basaldua Renne Crigler, chaplain

## 2017-08-25 NOTE — Evaluation (Signed)
Physical Therapy Evaluation Patient Details Name: James House. MRN: [id number] DOB: [birth year] Today's Date: 08/25/2017   History of Present Illness  Pt is a 47 y.o. male who presents with syncopal episode.  Patient stated he was in his kitchen getting something to drink and had a syncopal episode.  He stated that he felt some palpitations in his chest just prior to the syncope, and felt like his "blood pressure was dropping".  He stated this happened once before, though that time was without palpitations.  Prior occurrence was about 3 months ago.  In the ED he was found to have a right eyebrow laceration from his fall, and work-up was otherwise largely within normal limits.  Hospitalist were called for admission and further evaluation.  Assessment includes: syncope from orthostatic hypotension, acute L knee pain from fall with negative x-rays, hypokalemia, multiple myeloma, R eyebrow laceration s/p repair, HTN, and AKI on CKD stage III.      Clinical Impression  Pt Ind with bed mobility and transfers and was Mod Ind with amb with the use of a RW for LLE pain control with weight bearing.  Pt able to amb around 63' with decreased LLE stance time and moderate weight bearing through BUEs on the RW with good stability.  Pt's BPs taken by position prior to amb as follows: Supine 120/87 mmHg, Sitting 114/83 mmHg, and Standing 112/74 mmHg.  Pt reported mild dizziness in standing but felt safe to amb and reported dizziness decreased with movement.  Pt would benefit from a RW to assist with pain management during ambulation but does not require additional skilled PT services.  Will complete orders at this time but will reassess pt at a future date upon a change in status with receipt of new PT orders.     Follow Up Recommendations No PT follow up    Equipment Recommendations  Rolling walker with 5" wheels    Recommendations for Other Services       Precautions / Restrictions Precautions Precautions:  Fall Restrictions Weight Bearing Restrictions: No      Mobility  Bed Mobility Overal bed mobility: Independent                Transfers Overall transfer level: Independent               General transfer comment: Pt steady with transfers with good control  Ambulation/Gait Ambulation/Gait assistance: Modified independent (Device/Increase time) Ambulation Distance (Feet): 80 Feet Assistive device: Rolling walker (2 wheeled) Gait Pattern/deviations: Step-through pattern;Decreased step length - right;Decreased stance time - left;Antalgic Gait velocity: decreased   General Gait Details: Pt steady with amb with a RW with antalgic gait on the LLE with corresponding moderate WB through his BUEs for pain control  Stairs            Wheelchair Mobility    Modified Rankin (Stroke Patients Only)       Balance Overall balance assessment: No apparent balance deficits (not formally assessed)                                           Pertinent Vitals/Pain Pain Assessment: 0-10 Pain Score: 7  Pain Location: L knee Pain Descriptors / Indicators: Sore;Aching Pain Intervention(s): Premedicated before session;Monitored during session    Home Living Family/patient expects to be discharged to:: Private residence Living Arrangements: Spouse/significant other;Other (Comment)(Mother in law) Available  Help at Discharge: Family;Available 24 hours/day Type of Home: Apartment Home Access: Level entry     Home Layout: One level Home Equipment: None      Prior Function Level of Independence: Independent         Comments: Pt Ind with amb community distances, Ind with ADLs     Hand Dominance   Dominant Hand: Right    Extremity/Trunk Assessment   Upper Extremity Assessment Upper Extremity Assessment: Overall WFL for tasks assessed    Lower Extremity Assessment Lower Extremity Assessment: LLE deficits/detail LLE: Unable to fully assess due  to pain       Communication   Communication: No difficulties  Cognition Arousal/Alertness: Awake/alert Behavior During Therapy: WFL for tasks assessed/performed Overall Cognitive Status: Within Functional Limits for tasks assessed                                        General Comments      Exercises     Assessment/Plan    PT Assessment Patent does not need any further PT services  PT Problem List         PT Treatment Interventions      PT Goals (Current goals can be found in the Care Plan section)  Acute Rehab PT Goals PT Goal Formulation: All assessment and education complete, DC therapy    Frequency     Barriers to discharge        Co-evaluation               AM-PAC PT "6 Clicks" Daily Activity  Outcome Measure Difficulty turning over in bed (including adjusting bedclothes, sheets and blankets)?: None Difficulty moving from lying on back to sitting on the side of the bed? : None Difficulty sitting down on and standing up from a chair with arms (e.g., wheelchair, bedside commode, etc,.)?: None Help needed moving to and from a bed to chair (including a wheelchair)?: None Help needed walking in hospital room?: None Help needed climbing 3-5 steps with a railing? : A Little 6 Click Score: 23    End of Session Equipment Utilized During Treatment: Gait belt Activity Tolerance: Patient tolerated treatment well Patient left: in chair;with chair alarm set;with call bell/phone within reach Nurse Communication: Mobility status;Other (comment)(Orthostatic BPs taken and communicated with nursing) PT Visit Diagnosis: Pain Pain - Right/Left: Left Pain - part of body: Knee    Time: 4081-4481 PT Time Calculation (min) (ACUTE ONLY): 30 min   Charges:   PT Evaluation $PT Eval Low Complexity: 1 Low     PT G Codes:        DRoyetta Asal PT, DPT 08/25/17, 4:46 PM

## 2017-08-26 ENCOUNTER — Telehealth: Payer: Self-pay | Admitting: *Deleted

## 2017-08-26 LAB — BASIC METABOLIC PANEL
ANION GAP: 3 — AB (ref 5–15)
BUN: 18 mg/dL (ref 6–20)
CALCIUM: 8.3 mg/dL — AB (ref 8.9–10.3)
CO2: 28 mmol/L (ref 22–32)
Chloride: 108 mmol/L (ref 101–111)
Creatinine, Ser: 1.4 mg/dL — ABNORMAL HIGH (ref 0.61–1.24)
GFR, EST NON AFRICAN AMERICAN: 59 mL/min — AB (ref >60)
Glucose, Bld: 84 mg/dL (ref 65–99)
POTASSIUM: 4 mmol/L (ref 3.5–5.1)
Sodium: 139 mmol/L (ref 135–145)

## 2017-08-26 MED ORDER — ACETAMINOPHEN 325 MG PO TABS: 650.0000 mg | ORAL_TABLET | Freq: Four times a day (QID) | ORAL | Status: DC | PRN

## 2017-08-26 MED ORDER — OXYCODONE HCL 5 MG PO TABS: 5.0000 mg | ORAL_TABLET | Freq: Four times a day (QID) | ORAL | 0 refills | Status: DC | PRN

## 2017-08-26 NOTE — Telephone Encounter (Signed)
Can you take a look at my last note and schedule his appointments accordingly?

## 2017-08-26 NOTE — Discharge Summary (Signed)
[facility] NAME: James House    MR#:  [id number]  DATE OF BIRTH:  Sep 09, 1970  DATE OF ADMISSION:  08/23/2017 ADMITTING PHYSICIAN: Lance Coon, MD  DATE OF DISCHARGE:  08/26/17  PRIMARY CARE PHYSICIAN: Felicie Morn, PA-C    ADMISSION DIAGNOSIS:  Syncope and collapse [R55] Facial laceration, initial encounter [S01.81XA]  DISCHARGE DIAGNOSIS:  Principal Problem:   Syncope Active Problems:   Multiple myeloma (HCC)   CKD (chronic kidney disease), stage III (HCC)   HTN (hypertension)   SECONDARY DIAGNOSIS:   Past Medical History:  Diagnosis Date  . Cancer ([facility])   . Hypertension   . Multiple myeloma ([place]) 08/11/2017    HOSPITAL COURSE:   HPI  James House  is a 47 y.o. male who presents with syncopal episode.  Patient states he was in his kitchen getting something to drink and had syncopal episode.  He states that he felt some palpitations in his chest just prior to the syncope, and felt like his "blood pressure was dropping".  He states this is happened once before, though that time was without palpitations.  Prior occurrence was about 3 months ago.  Here in the ED he was found to have a right eyebrow laceration from his fall, and work-up was otherwise largely within normal limits.  Hospitalist were called for admission and further evaluation   Syncope -from orthostatic hypotension  Hydrated with IV fluids, clinically improved.  Denies any dizziness today.  Not orthostatic today.  Wants to go home  Monitor patient on telemetry   troponin was negative  echocardiogram -normal with good ejection fraction cardiology consult placed and seen by Dr. Rockey Situ recommending hydration with fluids and no further cardiac work-up needed-   Acute left knee pain from the fall Knee x-ray looks fine.  Apply ice and pain medication as needed PT consult placed, no PT needs identified recommending walker   Hypokalemia repleted and  recheck k at 4  Magnesium at 2.0  Right eyebrow laceration status post repair Continue wound care and pain management as needed  Multiple myeloma ([place]) -continue home meds sees Dr. Janese Banks as an outpatient Follow-up with Dr. Janese Banks as scheduled next month  HTN (hypertension) -not on any home medication.  Patient used to take amlodipine which was discontinued by the primary care physician lately  AKI onCKD (chronic kidney disease), stage III ([place]) -at baseline, monitor and avoid nephrotoxins.  Creatinine 1.72 at the time of admission today it is at 1.4 Baseline creatinine 1.5      DISCHARGE CONDITIONS:   Stable  CONSULTS OBTAINED:     PROCEDURES none  DRUG ALLERGIES:  No Known Allergies  DISCHARGE MEDICATIONS:   Allergies as of 08/26/2017   No Known Allergies     Medication List    STOP taking these medications   albuterol 108 (90 Base) MCG/ACT inhaler Commonly known as:  PROVENTIL HFA;VENTOLIN HFA     TAKE these medications   acetaminophen 325 MG tablet Commonly known as:  TYLENOL Take 2 tablets (650 mg total) by mouth every 6 (six) hours as needed for mild pain (or Fever >/= 101).   acyclovir 200 MG capsule Commonly known as:  ZOVIRAX Take 200 mg by mouth 2 (two) times daily.   aspirin EC 81 MG tablet Take 81 mg by mouth daily.   DULoxetine 60 MG capsule Commonly known as:  CYMBALTA Take 60 mg by mouth at bedtime.   oxyCODONE 5 MG  immediate release tablet Commonly known as:  Oxy IR/ROXICODONE Take 1-2 tablets (5-10 mg total) by mouth every 6 (six) hours as needed for moderate pain, severe pain or breakthrough pain. What changed:    how much to take  reasons to take this  Another medication with the same name was removed. Continue taking this medication, and follow the directions you see here.   pantoprazole 40 MG tablet Commonly known as:  PROTONIX Take 40 mg by mouth daily.   pregabalin 150 MG capsule Commonly known as:  LYRICA Take  150 mg by mouth 2 (two) times daily.   prochlorperazine 10 MG tablet Commonly known as:  COMPAZINE Take 10 mg by mouth every 6 (six) hours as needed for nausea or vomiting.   REVLIMID 20 MG capsule Generic drug:  lenalidomide Take 20 mg by mouth daily. Swallow whole.  Do not Crush.   tiZANidine 4 MG tablet Commonly known as:  ZANAFLEX Take 4 mg by mouth every 8 (eight) hours as needed for muscle spasms.   traZODone 50 MG tablet Commonly known as:  DESYREL Take 50 mg by mouth at bedtime.   Vitamin D (Ergocalciferol) 50000 units Caps capsule Commonly known as:  DRISDOL Take 50,000 Units by mouth every 7 (seven) days. Takes on Monday        DISCHARGE INSTRUCTIONS:   Follow-up with primary care physician in a week, PCP to remove the right eyebrow laceration sutures Follow-up with oncology Dr. Janese Banks  in a month as scheduled  DIET:  Low salt  DISCHARGE CONDITION:  Fair  ACTIVITY:  Activity as tolerated  OXYGEN:  Home Oxygen: No.   Oxygen Delivery: room air  DISCHARGE LOCATION:  home   If you experience worsening of your admission symptoms, develop shortness of breath, life threatening emergency, suicidal or homicidal thoughts you must seek medical attention immediately by calling 911 or calling your MD immediately  if symptoms less severe.  You Must read complete instructions/literature along with all the possible adverse reactions/side effects for all the Medicines you take and that have been prescribed to you. Take any new Medicines after you have completely understood and accpet all the possible adverse reactions/side effects.   Please note  You were cared for by a hospitalist during your hospital stay. If you have any questions about your discharge medications or the care you received while you were in the hospital after you are discharged, you can call the unit and asked to speak with the hospitalist on call if the hospitalist that took care of you is not available.  Once you are discharged, your primary care physician will handle any further medical issues. Please note that NO REFILLS for any discharge medications will be authorized once you are discharged, as it is imperative that you return to your primary care physician (or establish a relationship with a primary care physician if you do not have one) for your aftercare needs so that they can reassess your need for medications and monitor your lab values.     Today  Chief Complaint  Patient presents with  . Fall   Patient is feeling much better today, denies any dizziness and wants to go home  ROS:  CONSTITUTIONAL: Denies fevers, chills. Denies any fatigue, weakness.  EYES: Denies blurry vision, double vision, eye pain. EARS, NOSE, THROAT: Denies tinnitus, ear pain, hearing loss. RESPIRATORY: Denies cough, wheeze, shortness of breath.  CARDIOVASCULAR: Denies chest pain, palpitations, edema.  GASTROINTESTINAL: Denies nausea, vomiting, diarrhea, abdominal pain. Denies bright  red blood per rectum. GENITOURINARY: Denies dysuria, hematuria. ENDOCRINE: Denies nocturia or thyroid problems. HEMATOLOGIC AND LYMPHATIC: Denies easy bruising or bleeding. SKIN: Denies rash or lesion. MUSCULOSKELETAL: Denies pain in neck, back, shoulder, knees, hips or arthritic symptoms.  NEUROLOGIC: Denies paralysis, paresthesias.  PSYCHIATRIC: Denies anxiety or depressive symptoms.   VITAL SIGNS:  Blood pressure 111/73, pulse 87, temperature 97.8 F (36.6 C), temperature source Oral, resp. rate 14, height _0  (1.702 m), weight 64.3 kg (141 lb 11.2 oz), SpO2 100 %.  I/O:    Intake/Output Summary (Last 24 hours) at 08/26/2017 1142 Last data filed at 08/26/2017 1001 Gross per 24 hour  Intake 960 ml  Output 3200 ml  Net -2240 ml    PHYSICAL EXAMINATION:  GENERAL:  47 y.o.-year-old patient lying in the bed with no acute distress.  EYES: Pupils equal, round, reactive to light and accommodation. No scleral icterus.  Extraocular muscles intact.  Right eyebrow laceration with sutures HEENT: Head atraumatic, normocephalic. Oropharynx and nasopharynx clear.  NECK:  Supple, no jugular venous distention. No thyroid enlargement, no tenderness.  LUNGS: Normal breath sounds bilaterally, no wheezing, rales,rhonchi or crepitation. No use of accessory muscles of respiration.  CARDIOVASCULAR: S1, S2 normal. No murmurs, rubs, or gallops.  ABDOMEN: Soft, non-tender, non-distended. Bowel sounds present. No organomegaly or mass.  EXTREMITIES: No pedal edema, cyanosis, or clubbing.  NEUROLOGIC: Cranial nerves II through XII are intact. Muscle strength 5/5 in all extremities. Sensation intact. Gait not checked.  PSYCHIATRIC: The patient is alert and oriented x 3.  SKIN: No obvious rash, lesion, or ulcer.   DATA REVIEW:   CBC Recent Labs  Lab 08/23/17 2238  WBC 8.2  HGB 12.3*  HCT 35.2*  PLT 291    Chemistries  Recent Labs  Lab 08/23/17 2238 08/25/17 0404 08/26/17 0553  NA 135  --  139  K 3.4*  --  4.0  CL 104  --  108  CO2 24  --  28  GLUCOSE 103*  --  84  BUN 20  --  18  CREATININE 1.72* 1.62* 1.40*  CALCIUM 9.0  --  8.3*  MG  --  2.0  --   AST 24  --   --   ALT 16*  --   --   ALKPHOS 133*  --   --   BILITOT 0.5  --   --     Cardiac Enzymes Recent Labs  Lab 08/23/17 2238  TROPONINI <0.03    Microbiology Results  Results for orders placed or performed during the hospital encounter of 08/11/17  Blood Culture (routine x 2)     Status: None   Collection Time: 08/11/17  9:15 PM  Result Value Ref Range Status   Specimen Description BLOOD RIGHT [facility]  Final   Special Requests   Final    BOTTLES DRAWN AEROBIC AND ANAEROBIC Blood Culture adequate volume   Culture   Final    NO GROWTH 5 DAYS Performed at [facility], [address] [place], [place] [address]    Report Status 08/16/2017 FINAL  Final  Blood Culture (routine x 2)     Status: None   Collection Time: 08/11/17  9:15 PM   Result Value Ref Range Status   Specimen Description BLOOD LEFT UPPER ARM  Final   Special Requests   Final    BOTTLES DRAWN AEROBIC AND ANAEROBIC Blood Culture results may not be optimal due to an excessive volume of blood received in culture bottles  Culture   Final    NO GROWTH 5 DAYS Performed at [facility], [address] [place], [place] [address]    Report Status 08/16/2017 FINAL  Final  Urine culture     Status: None   Collection Time: 08/11/17  9:15 PM  Result Value Ref Range Status   Specimen Description   Final    URINE, RANDOM Performed at [facility], [address] [place], [place] [address]    Special Requests   Final    NONE Performed at [facility], [address] [place], [place] [address]    Culture   Final    NO GROWTH Performed at [facility], [place] [address], [place], [place] [address]    Report Status 08/13/2017 FINAL  Final  MRSA PCR Screening     Status: None   Collection Time: 08/12/17  8:38 AM  Result Value Ref Range Status   MRSA by PCR NEGATIVE NEGATIVE Final    Comment:        The GeneXpert MRSA Assay (FDA approved for NASAL specimens only), is one component of a comprehensive MRSA colonization surveillance program. It is not intended to diagnose MRSA infection nor to guide or monitor treatment for MRSA infections. Performed at [facility], [address] [place], [place] [address]     RADIOLOGY:  Ct Head Wo Contrast  Result Date: 08/23/2017 CLINICAL DATA:  Facial laceration after fall. EXAM: CT HEAD WITHOUT CONTRAST CT CERVICAL SPINE WITHOUT CONTRAST TECHNIQUE: Multidetector CT imaging of the head and cervical spine was performed following the standard protocol without intravenous contrast. Multiplanar CT image reconstructions of the cervical spine were also generated. COMPARISON:  CT scan of May 12, 2015. FINDINGS: CT HEAD FINDINGS Brain: No evidence of acute infarction,  hemorrhage, hydrocephalus, extra-axial collection or mass lesion/mass effect. Vascular: No hyperdense vessel or unexpected calcification. Skull: Normal. Negative for fracture or focal lesion. Sinuses/Orbits: No acute finding. Other: None. CT CERVICAL SPINE FINDINGS Alignment: Normal. Skull base and vertebrae: No acute fracture. No primary bone lesion or focal pathologic process. Soft tissues and spinal canal: No prevertebral fluid or swelling. No visible canal hematoma. Disc levels:  Normal. Upper chest: Negative. Other: None. IMPRESSION: Normal head CT. Normal cervical spine. Electronically Signed   By: Marijo Conception, M.D.   On: 08/23/2017 22:35   Ct Cervical Spine Wo Contrast  Result Date: 08/23/2017 CLINICAL DATA:  Facial laceration after fall. EXAM: CT HEAD WITHOUT CONTRAST CT CERVICAL SPINE WITHOUT CONTRAST TECHNIQUE: Multidetector CT imaging of the head and cervical spine was performed following the standard protocol without intravenous contrast. Multiplanar CT image reconstructions of the cervical spine were also generated. COMPARISON:  CT scan of May 12, 2015. FINDINGS: CT HEAD FINDINGS Brain: No evidence of acute infarction, hemorrhage, hydrocephalus, extra-axial collection or mass lesion/mass effect. Vascular: No hyperdense vessel or unexpected calcification. Skull: Normal. Negative for fracture or focal lesion. Sinuses/Orbits: No acute finding. Other: None. CT CERVICAL SPINE FINDINGS Alignment: Normal. Skull base and vertebrae: No acute fracture. No primary bone lesion or focal pathologic process. Soft tissues and spinal canal: No prevertebral fluid or swelling. No visible canal hematoma. Disc levels:  Normal. Upper chest: Negative. Other: None. IMPRESSION: Normal head CT. Normal cervical spine. Electronically Signed   By: Marijo Conception, M.D.   On: 08/23/2017 22:35   Dg Knee Complete 4 Views Left  Result Date: 08/25/2017 CLINICAL DATA:  Left knee pain. Syncopal episode. Post syncope left  knee  pain. History of left partial joint replacement 3 years ago. EXAM: LEFT KNEE - COMPLETE 4+ VIEW COMPARISON:  AP and lateral views of the left knee of May 12, 2015 FINDINGS: The bones are subjectively adequately mineralized. No acute native bone fracture is observed. The prosthesis in the medial compartment appears intact and appropriately positioned. The lateral and patellofemoral joint spaces are reasonably well-maintained. The soft tissues of the knee are unremarkable. IMPRESSION: There is no acute or significant chronic bony abnormality of the left knee. The prosthetic medial joint compartment structures appear intact and appropriately positioned. Electronically Signed   By: David  Martinique M.D.   On: 08/25/2017 14:35    EKG:   Orders placed or performed during the hospital encounter of 08/23/17  . ED EKG  . ED EKG      Management plans discussed with the patient, family and they are in agreement.  CODE STATUS:     Code Status Orders  (From admission, onward)        Start     Ordered   08/24/17 0125  Full code  Continuous     08/24/17 0124    Code Status History    Date Active Date Inactive Code Status Order ID Comments User Context   08/12/2017 0018 08/13/2017 1646 Full Code [id number]  Lance Coon, MD Inpatient      TOTAL TIME TAKING CARE OF THIS PATIENT: 41 minutes.   Note: This dictation was prepared with Dragon dictation along with smaller phrase technology. Any transcriptional errors that result from this process are unintentional.   _0 @  on 08/26/2017 at 11:42 AM  Between 7am to 6pm - Pager - [phone]  After 6pm go to [email or web address] - password [email or web address] [facility]  Office  [phone]  CC: Primary care physician; Felicie Morn, PA-C

## 2017-08-26 NOTE — Telephone Encounter (Signed)
I have sent inbasket to [facility] to correct the f/u.  He should have a visit with labs 5/27 and then another one in June. She will fix it and then call pt

## 2017-08-26 NOTE — Telephone Encounter (Signed)
He is already scheduled a follow up in June, Do you need to move it up?

## 2017-08-26 NOTE — Telephone Encounter (Signed)
Patient called and reports he is being discharged form hospital today and is asking if he needs an appointment with Dr Janese Banks. His next appointment is on 09/26/17. Please advise

## 2017-08-26 NOTE — Discharge Instructions (Signed)
Follow-up with primary care physician in a week, PCP to remove the right eyebrow laceration sutures Follow-up with oncology Dr. Janese Banks  in a month as scheduled

## 2017-08-27 ENCOUNTER — Other Ambulatory Visit: Payer: Self-pay | Admitting: *Deleted

## 2017-08-27 ENCOUNTER — Telehealth: Payer: Self-pay | Admitting: *Deleted

## 2017-08-27 DIAGNOSIS — C9001 Multiple myeloma in remission: Secondary | ICD-10-CM

## 2017-08-27 NOTE — Telephone Encounter (Signed)
I called [employer] because I was unable to renew his revlimid and was told that Dr. Rica Koyanagi office refilled and has active auth. The patient has not done his survey. They suggested that pt call the old MD office and tell them  To stop refills after this one and we will take over from here. I called pt and told him about above and he usually gets a call from [employer] and he does not have the number. I gave him the number and he will call and do survey now. He will also call dr. Rica Koyanagi office and let them know not to refill in future because our office is taking over per pt request.  He will do both

## 2017-08-29 NOTE — Telephone Encounter (Signed)
Oral Chemotherapy Pharmacist Encounter   Patient called and LVM stating he is running low on his Revlimid and he has not heard about a refill. Reached out to [employer] and Mr. Ardoin has completed his part of the [place] patient survey and according to them the [facility] is good to go. Reach out to [facility] where the patient fills his medication and they stated that they do not have a prescription for his Revlimid.   The last one that have was sent/filled on 07/23/17. Called [facility] (the office of Dr. Tracey Harries, [phone]) who submitted for the active [employer] REMs number, spoke with their office triage RN. Discussed with him that a REMs auth was submitted from the office but no Rx sent. The triage Rn said that he would look into the issue and follow up with me. I provided him with my office number.    Darl Pikes, PharmD, BCPS Hematology/Oncology Clinical Pharmacist [facility] [phone]  08/29/2017 1:46 PM

## 2017-09-03 ENCOUNTER — Other Ambulatory Visit: Payer: Self-pay | Admitting: Oncology

## 2017-09-03 ENCOUNTER — Other Ambulatory Visit: Payer: Self-pay | Admitting: *Deleted

## 2017-09-03 MED ORDER — LENALIDOMIDE 10 MG PO CAPS: 10.0000 mg | ORAL_CAPSULE | Freq: Every day | ORAL | 0 refills | Status: DC

## 2017-09-03 NOTE — Telephone Encounter (Signed)
Oral Chemotherapy Pharmacist Encounter   Called [facility] and they still did not have a Revlimid Rx for James House. Called [employer] to inquire about the process of getting the REMs auth obtained by the [place] office cancelled so that our could register the patient and submit for a REMs number. [employer] was able to reach out the the [place] office and have the REMs number cancelled. We can now obtain a REMs number for this patient and send the number and a Rx to [facility]. Update Sherry with the [employer], she will see that an Rx is sent and the patient is enrolled in REMs at our office.    Darl Pikes, PharmD, BCPS Hematology/Oncology Clinical Pharmacist [facility] [phone]  09/03/2017 1:17 PM

## 2017-09-16 ENCOUNTER — Encounter: Payer: Self-pay | Admitting: Oncology

## 2017-09-16 ENCOUNTER — Inpatient Hospital Stay (HOSPITAL_BASED_OUTPATIENT_CLINIC_OR_DEPARTMENT_OTHER): Payer: Medicaid Other | Admitting: Oncology

## 2017-09-16 ENCOUNTER — Inpatient Hospital Stay: Payer: Medicaid Other | Attending: Oncology

## 2017-09-16 VITALS — BP 102/75 | HR 76 | Temp 98.4°F | Resp 18 | Ht 67.0 in | Wt 145.9 lb

## 2017-09-16 DIAGNOSIS — N189 Chronic kidney disease, unspecified: Secondary | ICD-10-CM | POA: Diagnosis not present

## 2017-09-16 DIAGNOSIS — G620 Drug-induced polyneuropathy: Secondary | ICD-10-CM | POA: Diagnosis not present

## 2017-09-16 DIAGNOSIS — T451X5A Adverse effect of antineoplastic and immunosuppressive drugs, initial encounter: Secondary | ICD-10-CM

## 2017-09-16 DIAGNOSIS — C9001 Multiple myeloma in remission: Secondary | ICD-10-CM

## 2017-09-16 DIAGNOSIS — G4700 Insomnia, unspecified: Secondary | ICD-10-CM | POA: Insufficient documentation

## 2017-09-16 DIAGNOSIS — N289 Disorder of kidney and ureter, unspecified: Secondary | ICD-10-CM

## 2017-09-16 DIAGNOSIS — Z9484 Stem cells transplant status: Secondary | ICD-10-CM | POA: Insufficient documentation

## 2017-09-16 LAB — CBC WITH DIFFERENTIAL/PLATELET
BASOS ABS: 0 10*3/uL (ref 0–0.1)
Basophils Relative: 1 %
Eosinophils Absolute: 0.1 10*3/uL (ref 0–0.7)
Eosinophils Relative: 4 %
HEMATOCRIT: 37.6 % — AB (ref 40.0–52.0)
HEMOGLOBIN: 13.2 g/dL (ref 13.0–18.0)
LYMPHS PCT: 22 %
Lymphs Abs: 0.8 10*3/uL — ABNORMAL LOW (ref 1.0–3.6)
MCH: 33.2 pg (ref 26.0–34.0)
MCHC: 35.1 g/dL (ref 32.0–36.0)
MCV: 94.6 fL (ref 80.0–100.0)
Monocytes Absolute: 0.3 10*3/uL (ref 0.2–1.0)
Monocytes Relative: 7 %
NEUTROS ABS: 2.3 10*3/uL (ref 1.4–6.5)
NEUTROS PCT: 66 %
Platelets: 193 10*3/uL (ref 150–440)
RBC: 3.98 MIL/uL — AB (ref 4.40–5.90)
RDW: 15.7 % — ABNORMAL HIGH (ref 11.5–14.5)
WBC: 3.5 10*3/uL — AB (ref 3.8–10.6)

## 2017-09-16 LAB — COMPREHENSIVE METABOLIC PANEL
ALBUMIN: 3.9 g/dL (ref 3.5–5.0)
ALT: 14 U/L — ABNORMAL LOW (ref 17–63)
ANION GAP: 7 (ref 5–15)
AST: 22 U/L (ref 15–41)
Alkaline Phosphatase: 98 U/L (ref 38–126)
BILIRUBIN TOTAL: 0.7 mg/dL (ref 0.3–1.2)
BUN: 11 mg/dL (ref 6–20)
CALCIUM: 9.1 mg/dL (ref 8.9–10.3)
CO2: 25 mmol/L (ref 22–32)
Chloride: 104 mmol/L (ref 101–111)
Creatinine, Ser: 1.85 mg/dL — ABNORMAL HIGH (ref 0.61–1.24)
GFR, EST AFRICAN AMERICAN: 49 mL/min — AB (ref >60)
GFR, EST NON AFRICAN AMERICAN: 42 mL/min — AB (ref >60)
GLUCOSE: 113 mg/dL — AB (ref 65–99)
POTASSIUM: 4.5 mmol/L (ref 3.5–5.1)
Sodium: 136 mmol/L (ref 135–145)
TOTAL PROTEIN: 6.8 g/dL (ref 6.5–8.1)

## 2017-09-16 NOTE — Progress Notes (Signed)
Pt has chronic back pain with bilateral leg pain. His neuropathy is from treatment of myeloma of hands and specific finger tips, and entire bilateral feet. He is drinking water and trying to drink but not sure if he drinks enough. He has stopped going to pain clinic due to the 1 hour drive and no air conditioner in his car. He wants to be closer to home

## 2017-09-16 NOTE — Progress Notes (Signed)
Hematology/Oncology Consult note [facility] Telephone:([phone] [phone]  Patient Care Team: Mock, Colon Branch as PCP - General (Family Medicine) Vella Redhead, MD as PCP - Hematology/Oncology   Name of the patient: James House  [id number]  1970-12-28   Diagnosis: light chain kappa multiple myeloma s/p ASCT currently in remission. Patient on maintenance revlimid  Date of visit: 09/16/17  History of presenting illness- Patient was last seen by primary medical oncologist Dr. Janese Banks on 08/19/2016.  At that visit he reported significant fatigue since recently being discharged from the hospital.  He had recently run out of pain medications and sees a pain clinic at [facility] in [place] but did not have follow-up for several weeks.  He reported continued numbness and tingling in his extremities which was no worse but chronic nonetheless.  He was advised to stop taking gabapentin along with his Lyrica.  He was noticed to have an elevated baseline creatinine around 1.5, chronic back pain and insomnia.  Patient had not received dental clearance to begin Zometa quite yet.  Was found to be anemic thought to be secondary to his pneumonia but additional iron levels were checked including B12 and folate levels.  All labs returned and were normal. As far as his myeloma was concerned, [facility]'s plan was to switch his 10 mg Revlimid to an every 3 weeks on 1 week off regimen for worsening fatigue.  He was to continue acyclovir, aspirin and Protonix.   Myeloma labs (08/19/17) revealed a nonobservable M spike and a normal kappa lambda light chain ratio.  Patient's history is as follows:  1.  Patient presented with renal insufficiency with a creatinine of 2.4 in April 2018.  Kidney biopsy showed myeloma cast nephropathy.  Kappa light chain was 3004 and lambda 0.22 with a kappa lambda ratio of 13,000 655.  Skeletal survey showed multiple calvarial and marrow lesions.  Bone marrow biopsy in  May 2018 showed 43% plasma cells.  He received CyBorD for cycle 1 in May 2018 and was subsequently switched to RVD.  He received 4 cycles followed by a repeat bone marrow biopsy in August 2018 which showed no increase in plasma cells.  2.  He underwent autologous stem cell transplantation on 01/30/2017.  He was started on Revlimid maintenance 10 mg daily in January 2019.  3.  Labs from January February and March 2019 done at [facility] showed no M spike, normal kappa lambda ratio of 1.06.  He was last seen by them in April 2019.  Following that patient was admitted to [facility] for healthcare associated pneumonia and was recently discharged.  He wished to transfer his care to [place] at this time.  With regards to his myeloma patient is currently on 10 mg of Revlimid daily.  He is also on acyclovir 400 mg twice daily which he is to continue for a minimum of 1 year post transplant.  He has also been advised by [facility] to continue aspirin 162 mg daily along with pantoprazole.  Plan was to start Zometa clearance.  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  He also gets OxyContin and oxycodone for his neuropathy through pain clinic.  He is on Zanaflex for his chronic back pain.  He also gets trazodone for his insomnia.  Interval history: Patient presents today for routine follow-up.  States  he continues to be chronically fatigued.  He is unsure of where he is in his Revlimid cycle.  He did not bring his medications with him today.  He admits to a recent fall/ syncopal episode approximately 2 weeks ago (08/23/17) where he states he was standing in his kitchen and felt dizzy and hit his head on his bar and awoke a few minutes later with a head wound (2 cm right eyebrow laceration).  EMS was called and he was brought to the hospital to be evaluated.  Patient had work-up  including lab work and imaging of his head to rule out bleeding.  Could not identify source or etiology of fall.  Thought to be from medications. He was admitted for telemetry monitoring d/t palpitations.  He was found to be orthostatic and given IV fluids.  Cardiac enzymes were negative and no arrhythmia on telemetry while inpatient.  He was encouraged to push fluids and sent home.  He continues to complain of peripheral neuropathy that is not getting any better with current Lyrica medications.  Has not been back to see his pain clinic physician because he felt as though he "with a [place] pig".  Is not requesting any additional pain medications today.  ECOG PS- 1  Pain scale- 4  Review of systems- Review of Systems  Constitutional: Positive for malaise/fatigue (Chronic). Negative for chills, fever and weight loss.  HENT: Negative for congestion and ear pain.   Eyes: Negative.  Negative for blurred vision and double vision.  Respiratory: Negative.  Negative for cough, sputum production and shortness of breath.   Cardiovascular: Positive for leg swelling (Bilateral lower extremities relieved with TED hose and elevation). Negative for chest pain and palpitations.  Gastrointestinal: Negative.  Negative for abdominal pain, constipation, diarrhea, nausea and vomiting.  Genitourinary: Negative for dysuria, frequency and urgency.  Musculoskeletal: Positive for back pain (Chronic ). Negative for falls.  Skin: Negative.  Negative for rash.  Neurological: Positive for speech change and headaches (From recent fall). Negative for weakness.  Endo/Heme/Allergies: Negative.  Does not bruise/bleed easily.  Psychiatric/Behavioral: Negative.  Negative for depression. The patient is not nervous/anxious and does not have insomnia.     No Known Allergies  Patient Active Problem List   Diagnosis Date Noted  . Syncope 08/23/2017  . CKD (chronic kidney disease), stage III ([place]) 08/23/2017  . HTN (hypertension)  08/23/2017  . Severe sepsis ([place]) 08/11/2017  . Multifocal pneumonia 08/11/2017  . AKI (acute kidney injury) ([place]) 08/11/2017  . Hyponatremia 08/11/2017  . Multiple myeloma ([place]) 08/11/2017     Past Medical History:  Diagnosis Date  . Cancer ([place])   . Hypertension   . Multiple myeloma ([place]) 08/11/2017     Past Surgical History:  Procedure Laterality Date  . ABDOMINAL SURGERY    . JOINT REPLACEMENT     right knee  . KNEE SURGERY Left     Social History   Socioeconomic History  . Marital status: Married    Spouse name: [name]  . Number of children: 3  . Years of education: Not on file  . Highest education level: Not on file  Occupational History  . Not on file  Social Needs  . Financial resource strain: Not hard at all  . Food insecurity:    Worry: Never true    Inability: Never true  . Transportation needs:    Medical: No    Non-medical: No  Tobacco Use  . Smoking status: Former Smoker  Last attempt to quit: 08/19/2013    Years since quitting: 4.0  . Smokeless tobacco: Current User    Types: Snuff  Substance and Sexual Activity  . Alcohol use: No  . Drug use: No  . Sexual activity: Yes  Lifestyle  . Physical activity:    Days per week: 7 days    Minutes per session: 30 min  . Stress: Only a little  Relationships  . Social connections:    Talks on phone: Once a week    Gets together: Once a week    Attends religious service: More than 4 times per year    Active member of club or organization: No    Attends meetings of clubs or organizations: Never    Relationship status: Married  . Intimate partner violence:    Fear of current or ex partner: No    Emotionally abused: No    Physically abused: No    Forced sexual activity: No  Other Topics Concern  . Not on file  Social History Narrative   Live in private residence with spouse and mother in law     Family History  Problem Relation Age of Onset  . Cancer Mother 24       Pancreatic  . Cancer  Maternal Uncle        pancreatic  . Cancer Maternal Grandmother 40       colon     Current Outpatient Medications:  .  acetaminophen (TYLENOL) 325 MG tablet, Take 2 tablets (650 mg total) by mouth every 6 (six) hours as needed for mild pain (or Fever >/= 101)., Disp: , Rfl:  .  acyclovir (ZOVIRAX) 200 MG capsule, Take 200 mg by mouth 2 (two) times daily. , Disp: , Rfl:  .  aspirin EC 81 MG tablet, Take 81 mg by mouth daily., Disp: , Rfl:  .  DULoxetine (CYMBALTA) 60 MG capsule, Take 60 mg by mouth at bedtime. , Disp: , Rfl:  .  lenalidomide (REVLIMID) 10 MG capsule, Take 1 capsule (10 mg total) by mouth daily. Take daily for 21 days and then 7 days off of medication, Disp: 21 capsule, Rfl: 0 .  pregabalin (LYRICA) 150 MG capsule, Take 150 mg by mouth 2 (two) times daily., Disp: , Rfl:  .  tiZANidine (ZANAFLEX) 4 MG tablet, Take 4 mg by mouth every 8 (eight) hours as needed for muscle spasms., Disp: , Rfl:  .  traZODone (DESYREL) 50 MG tablet, Take 50 mg by mouth at bedtime. , Disp: , Rfl:  .  Vitamin D, Ergocalciferol, (DRISDOL) 50000 units CAPS capsule, Take 50,000 Units by mouth every 7 (seven) days. Takes on Monday, Disp: , Rfl:  .  oxyCODONE (OXY IR/ROXICODONE) 5 MG immediate release tablet, Take 1-2 tablets (5-10 mg total) by mouth every 6 (six) hours as needed for moderate pain, severe pain or breakthrough pain. (Patient not taking: Reported on 09/16/2017), Disp: 30 tablet, Rfl: 0 .  pantoprazole (PROTONIX) 40 MG tablet, Take 40 mg by mouth daily., Disp: , Rfl:  .  prochlorperazine (COMPAZINE) 10 MG tablet, Take 10 mg by mouth every 6 (six) hours as needed for nausea or vomiting., Disp: , Rfl:    Physical exam:  Vitals:   09/16/17 1126  BP: 102/75  Pulse: 76  Resp: 18  Temp: 98.4 F (36.9 C)  TempSrc: Tympanic  Weight: 145 lb 14.4 oz (66.2 kg)  Height: '5\' 7"'  (1.702 m)   Physical Exam  Constitutional: He is oriented to  person, place, and time. Vital signs are normal. He appears  well-developed and well-nourished.  HENT:  Head: Normocephalic and atraumatic.  Eyes: Pupils are equal, round, and reactive to light.  Neck: Normal range of motion.  Cardiovascular: Normal rate, regular rhythm and normal heart sounds.  No murmur heard. Pulmonary/Chest: Effort normal and breath sounds normal. He has no wheezes.  Abdominal: Soft. Normal appearance and bowel sounds are normal. He exhibits no distension. There is no tenderness.  Musculoskeletal: Normal range of motion. He exhibits edema (Nonpitting edema).  Neurological: He is alert and oriented to person, place, and time.  Skin: Skin is warm and dry. No rash noted.  Psychiatric: Judgment normal.       CMP Latest Ref Rng & Units 09/16/2017  Glucose 65 - 99 mg/dL 113(H)  BUN 6 - 20 mg/dL 11  Creatinine 0.61 - 1.24 mg/dL 1.85(H)  Sodium 135 - 145 mmol/L 136  Potassium 3.5 - 5.1 mmol/L 4.5  Chloride 101 - 111 mmol/L 104  CO2 22 - 32 mmol/L 25  Calcium 8.9 - 10.3 mg/dL 9.1  Total Protein 6.5 - 8.1 g/dL 6.8  Total Bilirubin 0.3 - 1.2 mg/dL 0.7  Alkaline Phos 38 - 126 U/L 98  AST 15 - 41 U/L 22  ALT 17 - 63 U/L 14(L)   CBC Latest Ref Rng & Units 09/16/2017  WBC 3.8 - 10.6 K/uL 3.5(L)  Hemoglobin 13.0 - 18.0 g/dL 13.2  Hematocrit 40.0 - 52.0 % 37.6(L)  Platelets 150 - 440 K/uL 193      Ct Head Wo Contrast  Result Date: 08/23/2017 CLINICAL DATA:  Facial laceration after fall. EXAM: CT HEAD WITHOUT CONTRAST CT CERVICAL SPINE WITHOUT CONTRAST TECHNIQUE: Multidetector CT imaging of the head and cervical spine was performed following the standard protocol without intravenous contrast. Multiplanar CT image reconstructions of the cervical spine were also generated. COMPARISON:  CT scan of May 12, 2015. FINDINGS: CT HEAD FINDINGS Brain: No evidence of acute infarction, hemorrhage, hydrocephalus, extra-axial collection or mass lesion/mass effect. Vascular: No hyperdense vessel or unexpected calcification. Skull: Normal.  Negative for fracture or focal lesion. Sinuses/Orbits: No acute finding. Other: None. CT CERVICAL SPINE FINDINGS Alignment: Normal. Skull base and vertebrae: No acute fracture. No primary bone lesion or focal pathologic process. Soft tissues and spinal canal: No prevertebral fluid or swelling. No visible canal hematoma. Disc levels:  Normal. Upper chest: Negative. Other: None. IMPRESSION: Normal head CT. Normal cervical spine. Electronically Signed   By: Marijo Conception, M.D.   On: 08/23/2017 22:35   Ct Cervical Spine Wo Contrast  Result Date: 08/23/2017 CLINICAL DATA:  Facial laceration after fall. EXAM: CT HEAD WITHOUT CONTRAST CT CERVICAL SPINE WITHOUT CONTRAST TECHNIQUE: Multidetector CT imaging of the head and cervical spine was performed following the standard protocol without intravenous contrast. Multiplanar CT image reconstructions of the cervical spine were also generated. COMPARISON:  CT scan of May 12, 2015. FINDINGS: CT HEAD FINDINGS Brain: No evidence of acute infarction, hemorrhage, hydrocephalus, extra-axial collection or mass lesion/mass effect. Vascular: No hyperdense vessel or unexpected calcification. Skull: Normal. Negative for fracture or focal lesion. Sinuses/Orbits: No acute finding. Other: None. CT CERVICAL SPINE FINDINGS Alignment: Normal. Skull base and vertebrae: No acute fracture. No primary bone lesion or focal pathologic process. Soft tissues and spinal canal: No prevertebral fluid or swelling. No visible canal hematoma. Disc levels:  Normal. Upper chest: Negative. Other: None. IMPRESSION: Normal head CT. Normal cervical spine. Electronically Signed   By: Marijo Conception,  M.D.   On: 08/23/2017 22:35   Dg Knee Complete 4 Views Left  Result Date: 08/25/2017 CLINICAL DATA:  Left knee pain. Syncopal episode. Post syncope left knee pain. History of left partial joint replacement 3 years ago. EXAM: LEFT KNEE - COMPLETE 4+ VIEW COMPARISON:  AP and lateral views of the left knee of  May 12, 2015 FINDINGS: The bones are subjectively adequately mineralized. No acute native bone fracture is observed. The prosthesis in the medial compartment appears intact and appropriately positioned. The lateral and patellofemoral joint spaces are reasonably well-maintained. The soft tissues of the knee are unremarkable. IMPRESSION: There is no acute or significant chronic bony abnormality of the left knee. The prosthetic medial joint compartment structures appear intact and appropriately positioned. Electronically Signed   By: David  Martinique M.D.   On: 08/25/2017 14:35    Assessment and plan- Patient is a 47 y.o. male with kappa light chain multiple myeloma status post RVD chemotherapy followed by autologous stem cell transplantation in August 2018 currently in remission and on maintenance Revlimid  1.  Multiple myeloma: Unsure of where he is with his Revlimid cycle.  He did not bring his medications with him.  Per Dr. Elroy Channel last note, plan per [facility] was to switch him to 3 weeks on and one-week off due to worsening fatigue.  He continues his acyclovir 400 mg twice daily along with aspirin and Protonix.  Myeloma labs from last visit did not reveal an M spike and a normal kappa lambda ratio.  Myeloma labs from today are pending.  CBC and CMET are baseline.  Creatinine continues to trend up.   Lab Results  Component Value Date   CREATININE 1.85 (H) 09/16/2017   CREATININE 1.40 (H) 08/26/2017   CREATININE 1.62 (H) 08/25/2017   Patient states he has been drinking plenty of water and trying to "push fluids".  Spoke with [employer] who is our oral chemotherapy pharmacist and she recommends holding Revlimid for 1 week and repeating kidney function at that time.  Patient's creatinine has fluctuated over the course of several months.  If his creatinine continues to be elevated, he may need a dose reduce to 5 mg of Revlimid.  Patient in agreement with plan.  Will consult with Dr. Janese Banks next week when she  returns from vacation and he returns for his 1 week follow-up.  Chemo-induced peripheral neuropathy: He has stopped his gabapentin as requested by Dr. Janese Banks.  He continues to Lyrica 150 mg two times a day. Per patient, he is no longer taking OxyContin and oxycodone from [name].  Have checked narcotic registry.       We will defer treatment for pain and peripheral neuropathy to PCP and pain management Dr. Phillis Knack whom he last saw on 08/29/2017.  Chronic renal insufficiency: Baseline is around 1.5.  Today's creatinine 1.85.  As mentioned above we will hold Revlimid for 1 week and reevaluate with labs next week.   Insomnia: Continue trazodone as prescribed.  Still awaiting dental clearance.  Zometa when cleared.  Vaccinations required post transplant.  He is required to have pneumococcal, hepatitis B and Pentacel vaccine at 12 months, 14 months and 18 months and Pneumovax 23 and 24 months.  These vaccinations will be scheduled through [facility].  He will return to clinic in mid June to see Dr. Janese Banks, with labs and possible Zometa.   Visit Diagnosis 1. Multiple myeloma in remission ([place])   2. Renal insufficiency     Greater than  50% was spent in counseling and coordination of care with this patient including but not limited to discussion of the relevant topics above (See A&P) including, but not limited to diagnosis and management of acute and chronic medical conditions.   Faythe Casa, NP 09/16/2017 3:14 PM

## 2017-09-17 ENCOUNTER — Telehealth: Payer: Self-pay | Admitting: *Deleted

## 2017-09-17 NOTE — Telephone Encounter (Signed)
Patient states NP was to get back to him regarding what medicine she was going to order for him. Please advise

## 2017-09-18 LAB — KAPPA/LAMBDA LIGHT CHAINS
KAPPA FREE LGHT CHN: 16.6 mg/L (ref 3.3–19.4)
KAPPA, LAMDA LIGHT CHAIN RATIO: 1.3 (ref 0.26–1.65)
LAMDA FREE LIGHT CHAINS: 12.8 mg/L (ref 5.7–26.3)

## 2017-09-19 LAB — MULTIPLE MYELOMA PANEL, SERUM
ALBUMIN/GLOB SERPL: 1.6 (ref 0.7–1.7)
ALPHA2 GLOB SERPL ELPH-MCNC: 0.7 g/dL (ref 0.4–1.0)
Albumin SerPl Elph-Mcnc: 3.8 g/dL (ref 2.9–4.4)
Alpha 1: 0.1 g/dL (ref 0.0–0.4)
B-Globulin SerPl Elph-Mcnc: 0.8 g/dL (ref 0.7–1.3)
GAMMA GLOB SERPL ELPH-MCNC: 0.8 g/dL (ref 0.4–1.8)
GLOBULIN, TOTAL: 2.4 g/dL (ref 2.2–3.9)
IGA: 170 mg/dL (ref 90–386)
IgG (Immunoglobin G), Serum: 871 mg/dL (ref 700–1600)
IgM (Immunoglobulin M), Srm: 45 mg/dL (ref 20–172)
Total Protein ELP: 6.2 g/dL (ref 6.0–8.5)

## 2017-09-23 ENCOUNTER — Inpatient Hospital Stay: Payer: Medicaid Other | Attending: Oncology

## 2017-09-23 ENCOUNTER — Inpatient Hospital Stay (HOSPITAL_BASED_OUTPATIENT_CLINIC_OR_DEPARTMENT_OTHER): Payer: Medicaid Other | Admitting: Oncology

## 2017-09-23 ENCOUNTER — Telehealth: Payer: Self-pay | Admitting: *Deleted

## 2017-09-23 ENCOUNTER — Encounter: Payer: Self-pay | Admitting: Oncology

## 2017-09-23 VITALS — BP 134/93 | HR 77 | Temp 97.2°F | Resp 18 | Ht 67.0 in | Wt 153.4 lb

## 2017-09-23 DIAGNOSIS — T451X5A Adverse effect of antineoplastic and immunosuppressive drugs, initial encounter: Secondary | ICD-10-CM | POA: Diagnosis not present

## 2017-09-23 DIAGNOSIS — C9001 Multiple myeloma in remission: Secondary | ICD-10-CM | POA: Diagnosis not present

## 2017-09-23 DIAGNOSIS — G620 Drug-induced polyneuropathy: Secondary | ICD-10-CM | POA: Insufficient documentation

## 2017-09-23 DIAGNOSIS — N183 Chronic kidney disease, stage 3 unspecified: Secondary | ICD-10-CM

## 2017-09-23 DIAGNOSIS — G8929 Other chronic pain: Secondary | ICD-10-CM

## 2017-09-23 DIAGNOSIS — Z87891 Personal history of nicotine dependence: Secondary | ICD-10-CM | POA: Insufficient documentation

## 2017-09-23 DIAGNOSIS — R5382 Chronic fatigue, unspecified: Secondary | ICD-10-CM | POA: Insufficient documentation

## 2017-09-23 DIAGNOSIS — N289 Disorder of kidney and ureter, unspecified: Secondary | ICD-10-CM

## 2017-09-23 DIAGNOSIS — Z9484 Stem cells transplant status: Secondary | ICD-10-CM | POA: Insufficient documentation

## 2017-09-23 DIAGNOSIS — M549 Dorsalgia, unspecified: Secondary | ICD-10-CM | POA: Diagnosis not present

## 2017-09-23 DIAGNOSIS — M545 Low back pain, unspecified: Secondary | ICD-10-CM

## 2017-09-23 LAB — COMPREHENSIVE METABOLIC PANEL
ALK PHOS: 103 U/L (ref 38–126)
ALT: 20 U/L (ref 17–63)
ANION GAP: 9 (ref 5–15)
AST: 23 U/L (ref 15–41)
Albumin: 3.9 g/dL (ref 3.5–5.0)
BUN: 14 mg/dL (ref 6–20)
CALCIUM: 9.1 mg/dL (ref 8.9–10.3)
CO2: 23 mmol/L (ref 22–32)
Chloride: 109 mmol/L (ref 101–111)
Creatinine, Ser: 1.61 mg/dL — ABNORMAL HIGH (ref 0.61–1.24)
GFR calc Af Amer: 58 mL/min — ABNORMAL LOW (ref >60)
GFR calc non Af Amer: 50 mL/min — ABNORMAL LOW (ref >60)
Glucose, Bld: 90 mg/dL (ref 65–99)
POTASSIUM: 4.2 mmol/L (ref 3.5–5.1)
SODIUM: 141 mmol/L (ref 135–145)
Total Bilirubin: 0.5 mg/dL (ref 0.3–1.2)
Total Protein: 6.6 g/dL (ref 6.5–8.1)

## 2017-09-23 LAB — CBC WITH DIFFERENTIAL/PLATELET
BASOS PCT: 1 %
Basophils Absolute: 0 10*3/uL (ref 0–0.1)
Eosinophils Absolute: 0.1 10*3/uL (ref 0–0.7)
Eosinophils Relative: 2 %
HEMATOCRIT: 37 % — AB (ref 40.0–52.0)
Hemoglobin: 13.1 g/dL (ref 13.0–18.0)
LYMPHS PCT: 14 %
Lymphs Abs: 0.6 10*3/uL — ABNORMAL LOW (ref 1.0–3.6)
MCH: 33.2 pg (ref 26.0–34.0)
MCHC: 35.4 g/dL (ref 32.0–36.0)
MCV: 93.7 fL (ref 80.0–100.0)
Monocytes Absolute: 0.4 10*3/uL (ref 0.2–1.0)
Monocytes Relative: 9 %
NEUTROS ABS: 3.4 10*3/uL (ref 1.4–6.5)
NEUTROS PCT: 74 %
Platelets: 162 10*3/uL (ref 150–440)
RBC: 3.95 MIL/uL — ABNORMAL LOW (ref 4.40–5.90)
RDW: 15.5 % — ABNORMAL HIGH (ref 11.5–14.5)
WBC: 4.6 10*3/uL (ref 3.8–10.6)

## 2017-09-23 MED ORDER — LENALIDOMIDE 5 MG PO CAPS: 5.0000 mg | ORAL_CAPSULE | Freq: Every day | ORAL | 2 refills | Status: DC

## 2017-09-23 NOTE — Telephone Encounter (Signed)
Called the office of transplant md- Dr/ Tracey Harries and Dr. Janese Banks wanted to talk to him about patient about f/u appt for transplant and about him needing next vaccinations in [place]to[place]. I have left Dr. Janese Banks cell phone for md to call back

## 2017-09-23 NOTE — Progress Notes (Signed)
Hematology/Oncology Consult note [facility]  Telephone:([phone] [phone]  Patient Care Team: Mock, Colon Branch as PCP - General (Family Medicine) Vella Redhead, MD as PCP - Hematology/Oncology   Name of the patient: James House  [id number]  [birth year]   Date of visit: 09/23/17  Diagnosis- light chain kappa multiple myeloma s/p ASCT currently in remission. Patient on maintenance revlimid   Chief complaint/ Reason for visit- evaluate reinitiation of maintenance Revlimid  Heme/Onc history: patient is a 47 year old male with a history of kappa light chain myeloma that was treated by [name].  History is as follows:  1.  Patient presented with renal insufficiency with a creatinine of 2.4 in April 2018.  Kidney biopsy showed myeloma cast nephropathy.  Kappa light chain was 3004 and lambda 0.22 with a kappa lambda ratio of 13,000 655.  Skeletal survey showed multiple calvarial and marrow lesions.  Bone marrow biopsy in May 2018 showed 43% plasma cells.  He received CyBorD for cycle 1 in May 2018 and was subsequently switched to RVD.  He received 4 cycles followed by a repeat bone marrow biopsy in August 2018 which showed no increase in plasma cells.  2.  He underwent autologous stem cell transplantation on 01/30/2017.  He was started on Revlimid maintenance 10 mg daily in January 2019.  3.  Labs from January February and March 2019 done at [facility] showed no M spike, normal kappa lambda ratio of 1.06.  He was last seen by them in April 2019.  Following that patient was admitted to [facility] for healthcare associated pneumonia and was recently discharged.  He wished to transfer his care to [place] at this time.  With regards to his myeloma patient is currently on 10 mg of Revlimid daily.  He is also on acyclovir 400 mg twice daily which he is to continue for a minimum of 1 year post transplant.  He has also been advised by [facility] to continue aspirin 162  mg daily along with pantoprazole.  Plan was to start Zometa pending dental clearance.  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  He also gets OxyContin and oxycodone for his neuropathy through pain clinic.  He is on Zanaflex for his chronic back pain.  He also gets trazodone for his insomnia.  Interval history-patient had been seen [name] until now but does not want to travel that far for continued management of his pain.  He reports chronic fatigue as well as neuropathy which is unchanged.  He was last seen on 09/16/2017 and at that point his Revlimid was held due to his elevated creatinine.  Prior to that he was switched to Revlimid 3-week on 1 week off regimen to see if his fatigue gets better but patient reports no change in his symptoms despite taking it 3 weeks on 1 week off.  ECOG PS- 1 Pain scale- 7   Review of systems- Review of Systems  Constitutional: Positive for malaise/fatigue. Negative for chills, fever and weight loss.  HENT: Negative for congestion, ear discharge and nosebleeds.   Eyes: Negative for blurred vision.  Respiratory: Negative for cough, hemoptysis, sputum production, shortness of breath and wheezing.   Cardiovascular: Negative for chest pain, palpitations, orthopnea and claudication.  Gastrointestinal: Negative for abdominal pain, blood in stool, constipation, diarrhea, heartburn,  melena, nausea and vomiting.  Genitourinary: Negative for dysuria, flank pain, frequency, hematuria and urgency.  Musculoskeletal: Negative for back pain, joint pain and myalgias.  Skin: Negative for rash.  Neurological: Positive for tingling. Negative for dizziness, focal weakness, seizures, weakness and headaches.  Endo/Heme/Allergies: Does not bruise/bleed easily.  Psychiatric/Behavioral: Negative for depression and suicidal  ideas. The patient does not have insomnia.        No Known Allergies   Past Medical History:  Diagnosis Date  . Cancer ([place])   . Hypertension   . Multiple myeloma ([place]) 08/11/2017     Past Surgical History:  Procedure Laterality Date  . ABDOMINAL SURGERY    . JOINT REPLACEMENT     right knee  . KNEE SURGERY Left     Social History   Socioeconomic History  . Marital status: Married    Spouse name: [name]  . Number of children: 3  . Years of education: Not on file  . Highest education level: Not on file  Occupational History  . Not on file  Social Needs  . Financial resource strain: Not hard at all  . Food insecurity:    Worry: Never true    Inability: Never true  . Transportation needs:    Medical: No    Non-medical: No  Tobacco Use  . Smoking status: Former Smoker    Last attempt to quit: 08/19/2013    Years since quitting: 4.0  . Smokeless tobacco: Current User    Types: Snuff  Substance and Sexual Activity  . Alcohol use: No  . Drug use: No  . Sexual activity: Yes  Lifestyle  . Physical activity:    Days per week: 7 days    Minutes per session: 30 min  . Stress: Only a little  Relationships  . Social connections:    Talks on phone: Once a week    Gets together: Once a week    Attends religious service: More than 4 times per year    Active member of club or organization: No    Attends meetings of clubs or organizations: Never    Relationship status: Married  . Intimate partner violence:    Fear of current or ex partner: No    Emotionally abused: No    Physically abused: No    Forced sexual activity: No  Other Topics Concern  . Not on file  Social History Narrative   Live in private residence with spouse and mother in law    Family History  Problem Relation Age of Onset  . Cancer Mother 37       Pancreatic  . Cancer Maternal Uncle        pancreatic  . Cancer Maternal Grandmother 25       colon     Current Outpatient Medications:  .   acyclovir (ZOVIRAX) 200 MG capsule, Take 200 mg by mouth 2 (two) times daily. , Disp: , Rfl:  .  aspirin EC 81 MG tablet, Take 81 mg by mouth daily., Disp: , Rfl:  .  DULoxetine (CYMBALTA) 60 MG capsule, Take 60 mg by mouth at bedtime. , Disp: , Rfl:  .  pantoprazole (PROTONIX) 40 MG tablet, Take 40 mg by mouth daily., Disp: , Rfl:  .  pregabalin (LYRICA) 150 MG capsule, Take 150 mg by mouth 2 (two) times daily., Disp: , Rfl:  .  tiZANidine (ZANAFLEX) 4 MG tablet, Take 4 mg by mouth every 8 (eight) hours as needed for muscle spasms., Disp: ,  Rfl:  .  traZODone (DESYREL) 50 MG tablet, Take 50 mg by mouth at bedtime. , Disp: , Rfl:  .  Vitamin D, Ergocalciferol, (DRISDOL) 50000 units CAPS capsule, Take 50,000 Units by mouth every 7 (seven) days. Takes on Monday, Disp: , Rfl:  .  acetaminophen (TYLENOL) 325 MG tablet, Take 2 tablets (650 mg total) by mouth every 6 (six) hours as needed for mild pain (or Fever >/= 101). (Patient not taking: Reported on 09/23/2017), Disp: , Rfl:  .  lenalidomide (REVLIMID) 10 MG capsule, Take 1 capsule (10 mg total) by mouth daily. Take daily for 21 days and then 7 days off of medication (Patient not taking: Reported on 09/23/2017), Disp: 21 capsule, Rfl: 0 .  oxyCODONE (OXY IR/ROXICODONE) 5 MG immediate release tablet, Take 1-2 tablets (5-10 mg total) by mouth every 6 (six) hours as needed for moderate pain, severe pain or breakthrough pain. (Patient not taking: Reported on 09/16/2017), Disp: 30 tablet, Rfl: 0 .  prochlorperazine (COMPAZINE) 10 MG tablet, Take 10 mg by mouth every 6 (six) hours as needed for nausea or vomiting., Disp: , Rfl:   Physical exam:  Vitals:   09/23/17 1100  BP: (!) 134/93  Pulse: 77  Resp: 18  Temp: (!) 97.2 F (36.2 C)  TempSrc: Tympanic  SpO2: 99%  Weight: 153 lb 6.4 oz (69.6 kg)  Height: '5\' 7"'  (1.702 m)   Physical Exam  Constitutional: He is oriented to person, place, and time.  Thin man in no acute distress  HENT:  Head:  Normocephalic and atraumatic.  Eyes: Pupils are equal, round, and reactive to light. EOM are normal.  Neck: Normal range of motion.  Cardiovascular: Normal rate, regular rhythm and normal heart sounds.  Pulmonary/Chest: Effort normal and breath sounds normal.  Abdominal: Soft. Bowel sounds are normal.  Neurological: He is alert and oriented to person, place, and time.  Skin: Skin is warm and dry.     CMP Latest Ref Rng & Units 09/23/2017  Glucose 65 - 99 mg/dL 90  BUN 6 - 20 mg/dL 14  Creatinine 0.61 - 1.24 mg/dL 1.61(H)  Sodium 135 - 145 mmol/L 141  Potassium 3.5 - 5.1 mmol/L 4.2  Chloride 101 - 111 mmol/L 109  CO2 22 - 32 mmol/L 23  Calcium 8.9 - 10.3 mg/dL 9.1  Total Protein 6.5 - 8.1 g/dL 6.6  Total Bilirubin 0.3 - 1.2 mg/dL 0.5  Alkaline Phos 38 - 126 U/L 103  AST 15 - 41 U/L 23  ALT 17 - 63 U/L 20   CBC Latest Ref Rng & Units 09/23/2017  WBC 3.8 - 10.6 K/uL 4.6  Hemoglobin 13.0 - 18.0 g/dL 13.1  Hematocrit 40.0 - 52.0 % 37.0(L)  Platelets 150 - 440 K/uL 162    No images are attached to the encounter.  Dg Knee Complete 4 Views Left  Result Date: 08/25/2017 CLINICAL DATA:  Left knee pain. Syncopal episode. Post syncope left knee pain. History of left partial joint replacement 3 years ago. EXAM: LEFT KNEE - COMPLETE 4+ VIEW COMPARISON:  AP and lateral views of the left knee of May 12, 2015 FINDINGS: The bones are subjectively adequately mineralized. No acute native bone fracture is observed. The prosthesis in the medial compartment appears intact and appropriately positioned. The lateral and patellofemoral joint spaces are reasonably well-maintained. The soft tissues of the knee are unremarkable. IMPRESSION: There is no acute or significant chronic bony abnormality of the left knee. The prosthetic medial joint compartment structures appear  intact and appropriately positioned. Electronically Signed   By: David  Martinique M.D.   On: 08/25/2017 14:35     Assessment and plan-  Patient is a 47 y.o. male with kappa light chain multiple myeloma status post RVD chemotherapy followed by autologous stem cell transplantation in August 2018 currently in remission and on maintenance Revlimid   1.  Multiple myeloma: His creatinine has now improved to 1.6 from 1.8.  However his GFR remains around 50.  Recommendations are to do Revlimid 5 mg for GFR between 30-60.  I will therefore restart Revlimid at this time but 5 mg.  Also his fatigue did not improve significantly despite taking it 3 weeks on 1 week off and therefore he will take 5 mg continuously for 28 days.  He will also continue his acyclovir aspirin and pantoprazole at this time.  I will see him back in 4 weeks time with repeat CBC, CMP, myeloma panel and serum free light chains.  2 chronic back pain and neuropathy:.  Patient does not wish to go back to [name] because of time taken for travel.  I explained to him that I would like his chronic pain to be managed by pain clinic and I am not in favor of prescribing these pain medicines myself.  I will refer him to [place] pain clinic and if they are able to take over his care then he does not need to go to [facility].  However if there are any issues getting him into the pain clinic here then I would recommend that he should continue to follow-up at [facility].  Patient has been restarted on gabapentin by his primary care doctor.  He is also been getting tramadol so far through her  3.  We will touch base with his dentist to obtain dental clearance and hopefully start Zometa next month    Visit Diagnosis 1. Multiple myeloma in remission ([place])   2. CKD (chronic kidney disease), stage III ([place])      Dr. Randa Evens, MD, MPH [facility] [place] [facility] [phone] 09/23/2017 1:02 PM

## 2017-09-26 ENCOUNTER — Ambulatory Visit: Payer: Medicaid Other | Admitting: Oncology

## 2017-09-26 ENCOUNTER — Other Ambulatory Visit: Payer: Medicaid Other

## 2017-10-06 ENCOUNTER — Encounter: Payer: Self-pay | Admitting: Oncology

## 2017-10-07 ENCOUNTER — Encounter: Payer: Self-pay | Admitting: Urology

## 2017-10-07 ENCOUNTER — Other Ambulatory Visit: Payer: Self-pay | Admitting: Oncology

## 2017-10-07 ENCOUNTER — Other Ambulatory Visit: Payer: Self-pay | Admitting: *Deleted

## 2017-10-07 ENCOUNTER — Ambulatory Visit: Payer: Medicaid Other | Admitting: Urology

## 2017-10-07 VITALS — BP 167/93 | HR 76 | Ht 67.0 in | Wt 154.0 lb

## 2017-10-07 DIAGNOSIS — N521 Erectile dysfunction due to diseases classified elsewhere: Secondary | ICD-10-CM

## 2017-10-07 DIAGNOSIS — E291 Testicular hypofunction: Secondary | ICD-10-CM

## 2017-10-07 MED ORDER — SILDENAFIL CITRATE 20 MG PO TABS: ORAL_TABLET | ORAL | 0 refills | Status: DC

## 2017-10-07 MED ORDER — ZOLPIDEM TARTRATE 5 MG PO TABS: 5.0000 mg | ORAL_TABLET | Freq: Every day | ORAL | 0 refills | Status: DC

## 2017-10-07 NOTE — Progress Notes (Signed)
10/07/2017 12:56 PM   James House. [birth year] [id number]  Referring provider: Felicie Morn, PA-C [address] [place], [place] [id number]  Chief Complaint  Patient presents with  . Erectile Dysfunction    HPI: 47 year old male referred for evaluation of hypogonadism.  He states he was diagnosed with low testosterone several months ago by his PCP.  His records were not available for review and were obtained from the patient.  He used testosterone gel for approximately 2-1/2 months and then stopped when he ran out of medication.  He states his levels were not checked.  He complains of tiredness, fatigue and difficulty achieving and maintaining an erection.  He had no apparent side effects with testosterone replacement.  He has multiple myeloma in remission.  He has not tried any therapies for erectile dysfunction.  He has occasional hesitancy and nocturia x2.   PMH: Past Medical History:  Diagnosis Date  . Cancer ([place])   . Hypertension   . Multiple myeloma ([place]) 08/11/2017    Surgical History: Past Surgical History:  Procedure Laterality Date  . ABDOMINAL SURGERY    . JOINT REPLACEMENT     right knee  . KNEE SURGERY Left     Home Medications:  Allergies as of 10/07/2017   No Known Allergies     Medication List        Accurate as of 10/07/17 12:56 PM. Always use your most recent med list.          acetaminophen 325 MG tablet Commonly known as:  TYLENOL Take 2 tablets (650 mg total) by mouth every 6 (six) hours as needed for mild pain (or Fever >/= 101).   acyclovir 200 MG capsule Commonly known as:  ZOVIRAX Take 200 mg by mouth 2 (two) times daily.   aspirin EC 81 MG tablet Take 81 mg by mouth daily.   DULoxetine 60 MG capsule Commonly known as:  CYMBALTA Take 60 mg by mouth at bedtime.   gabapentin 300 MG capsule Commonly known as:  NEURONTIN TAKE 1 CAPSULE BY MOUTH THREE TIMES A DAY   lenalidomide 5 MG capsule Commonly known as:   REVLIMID Take 1 capsule (5 mg total) by mouth daily for 28 days.   oxyCODONE 5 MG immediate release tablet Commonly known as:  Oxy IR/ROXICODONE Take 1-2 tablets (5-10 mg total) by mouth every 6 (six) hours as needed for moderate pain, severe pain or breakthrough pain.   pantoprazole 40 MG tablet Commonly known as:  PROTONIX Take 40 mg by mouth daily.   pregabalin 150 MG capsule Commonly known as:  LYRICA Take 150 mg by mouth 2 (two) times daily.   prochlorperazine 10 MG tablet Commonly known as:  COMPAZINE Take 10 mg by mouth every 6 (six) hours as needed for nausea or vomiting.   tiZANidine 4 MG tablet Commonly known as:  ZANAFLEX Take 4 mg by mouth every 8 (eight) hours as needed for muscle spasms.   traZODone 50 MG tablet Commonly known as:  DESYREL Take 50 mg by mouth at bedtime.   Vitamin D (Ergocalciferol) 50000 units Caps capsule Commonly known as:  DRISDOL Take 50,000 Units by mouth every 7 (seven) days. Takes on Monday       Allergies: No Known Allergies  Family History: Family History  Problem Relation Age of Onset  . Cancer Mother 69       Pancreatic  . Cancer Maternal Uncle        pancreatic  .  Cancer Maternal Grandmother 50       colon    Social History:  reports that he quit smoking about 4 years ago. His smokeless tobacco use includes snuff. He reports that he does not drink alcohol or use drugs.  ROS: UROLOGY Frequent Urination?: No Hard to postpone urination?: No Burning/pain with urination?: No Get up at night to urinate?: Yes Leakage of urine?: No Urine stream starts and stops?: No Trouble starting stream?: Yes Do you have to strain to urinate?: No Blood in urine?: No Urinary tract infection?: No Sexually transmitted disease?: No Injury to kidneys or bladder?: No Painful intercourse?: No Weak stream?: No Erection problems?: Yes Penile pain?: No  Gastrointestinal Nausea?: No Vomiting?: No Indigestion/heartburn?: No Diarrhea?:  No Constipation?: No  Constitutional Fever: No Night sweats?: No Weight loss?: No Fatigue?: Yes  Skin Skin rash/lesions?: No Itching?: No  Eyes Blurred vision?: No Double vision?: No  Ears/Nose/Throat Sore throat?: No Sinus problems?: No  Hematologic/Lymphatic Swollen glands?: No Easy bruising?: No  Cardiovascular Leg swelling?: No Chest pain?: No  Respiratory Cough?: No Shortness of breath?: Yes  Endocrine Excessive thirst?: No  Musculoskeletal Back pain?: Yes Joint pain?: Yes  Neurological Headaches?: No Dizziness?: No  Psychologic Depression?: No Anxiety?: No  Physical Exam: BP (!) 167/93 (BP Location: Left Arm, Patient Position: Sitting, Cuff Size: Normal)   Pulse 76   Ht 5' 7" (1.702 m)   Wt 154 lb (69.9 kg)   BMI 24.12 kg/m   Constitutional:  Alert and oriented, No acute distress. HEENT: [place] AT, moist mucus membranes.  Trachea midline, no masses. Cardiovascular: No clubbing, cyanosis, or edema. Respiratory: Normal respiratory effort, no increased work of breathing. GI: Abdomen is soft, nontender, nondistended, no abdominal masses GU: No CVA tenderness.  Penis without lesions, testes descended bilaterally without masses or tenderness.  Estimated volume 15 cc bilaterally. Lymph: No cervical or inguinal lymphadenopathy. Skin: No rashes, bruises or suspicious lesions. Neurologic: Grossly intact, no focal deficits, moving all 4 extremities. Psychiatric: Normal mood and affect.   Assessment & Plan:   47 year old male with erectile dysfunction and hypogonadism by history.  His PCP notes pertaining to TRT were requested.  He has been off testosterone replacement for 6 weeks and a repeat testosterone level along with an LH and prolactin were ordered.  He was interested in a trial of a PDE 5 inhibitor and Rx sildenafil was sent to [facility].  Abbie Sons, [place] [address] [place],  [place] [address] [phone]

## 2017-10-08 LAB — TESTOSTERONE: TESTOSTERONE: 597 ng/dL (ref 264–916)

## 2017-10-08 LAB — LUTEINIZING HORMONE: LH: 13.2 m[IU]/mL — AB (ref 1.7–8.6)

## 2017-10-08 LAB — PROLACTIN: Prolactin: 8.4 ng/mL (ref 4.0–15.2)

## 2017-10-08 NOTE — Telephone Encounter (Signed)
I had called pt to see what pharmacy he used and it was [facility] on [address] and I called in the ambien 5 mg per Dr. Janese Banks to see if that could help him sleep better. I told him on the phone that I was calling it in.

## 2017-10-09 ENCOUNTER — Telehealth: Payer: Self-pay | Admitting: *Deleted

## 2017-10-09 NOTE — Telephone Encounter (Signed)
Called pain clinic today and spoke to annette to see if pt has been given an appt yet. She states that they called patient to make an appt and he states that he will be out of town and return on 9/19 and he will call back then to make an appt for there.

## 2017-10-14 ENCOUNTER — Inpatient Hospital Stay (HOSPITAL_BASED_OUTPATIENT_CLINIC_OR_DEPARTMENT_OTHER): Payer: Medicaid Other | Admitting: Oncology

## 2017-10-14 ENCOUNTER — Inpatient Hospital Stay: Payer: Medicaid Other

## 2017-10-14 ENCOUNTER — Encounter: Payer: Self-pay | Admitting: Oncology

## 2017-10-14 VITALS — BP 136/91 | HR 86 | Temp 98.6°F | Resp 18 | Ht 67.0 in | Wt 157.5 lb

## 2017-10-14 DIAGNOSIS — Z87891 Personal history of nicotine dependence: Secondary | ICD-10-CM

## 2017-10-14 DIAGNOSIS — N183 Chronic kidney disease, stage 3 (moderate): Secondary | ICD-10-CM

## 2017-10-14 DIAGNOSIS — Z79899 Other long term (current) drug therapy: Secondary | ICD-10-CM

## 2017-10-14 DIAGNOSIS — M549 Dorsalgia, unspecified: Secondary | ICD-10-CM

## 2017-10-14 DIAGNOSIS — G8929 Other chronic pain: Secondary | ICD-10-CM

## 2017-10-14 DIAGNOSIS — C9001 Multiple myeloma in remission: Secondary | ICD-10-CM

## 2017-10-14 DIAGNOSIS — G620 Drug-induced polyneuropathy: Secondary | ICD-10-CM | POA: Diagnosis not present

## 2017-10-14 DIAGNOSIS — Z9484 Stem cells transplant status: Secondary | ICD-10-CM

## 2017-10-14 DIAGNOSIS — Z7983 Long term (current) use of bisphosphonates: Secondary | ICD-10-CM

## 2017-10-14 LAB — CBC WITH DIFFERENTIAL/PLATELET
Basophils Absolute: 0 10*3/uL (ref 0–0.1)
Basophils Relative: 1 %
EOS ABS: 0.1 10*3/uL (ref 0–0.7)
EOS PCT: 2 %
HCT: 38.7 % — ABNORMAL LOW (ref 40.0–52.0)
HEMOGLOBIN: 13.7 g/dL (ref 13.0–18.0)
LYMPHS ABS: 0.9 10*3/uL — AB (ref 1.0–3.6)
Lymphocytes Relative: 19 %
MCH: 33.5 pg (ref 26.0–34.0)
MCHC: 35.3 g/dL (ref 32.0–36.0)
MCV: 95 fL (ref 80.0–100.0)
MONO ABS: 0.4 10*3/uL (ref 0.2–1.0)
MONOS PCT: 9 %
NEUTROS PCT: 69 %
Neutro Abs: 3.5 10*3/uL (ref 1.4–6.5)
Platelets: 231 10*3/uL (ref 150–440)
RBC: 4.08 MIL/uL — ABNORMAL LOW (ref 4.40–5.90)
RDW: 15.4 % — AB (ref 11.5–14.5)
WBC: 5 10*3/uL (ref 3.8–10.6)

## 2017-10-14 LAB — COMPREHENSIVE METABOLIC PANEL
ALT: 22 U/L (ref 0–44)
AST: 29 U/L (ref 15–41)
Albumin: 4 g/dL (ref 3.5–5.0)
Alkaline Phosphatase: 115 U/L (ref 38–126)
Anion gap: 9 (ref 5–15)
BUN: 15 mg/dL (ref 6–20)
CALCIUM: 9.2 mg/dL (ref 8.9–10.3)
CHLORIDE: 103 mmol/L (ref 98–111)
CO2: 25 mmol/L (ref 22–32)
Creatinine, Ser: 1.77 mg/dL — ABNORMAL HIGH (ref 0.61–1.24)
GFR calc Af Amer: 51 mL/min — ABNORMAL LOW (ref >60)
GFR calc non Af Amer: 44 mL/min — ABNORMAL LOW (ref >60)
GLUCOSE: 99 mg/dL (ref 70–99)
Potassium: 3.7 mmol/L (ref 3.5–5.1)
SODIUM: 137 mmol/L (ref 135–145)
Total Bilirubin: 0.8 mg/dL (ref 0.3–1.2)
Total Protein: 6.9 g/dL (ref 6.5–8.1)

## 2017-10-14 MED ORDER — DENOSUMAB 120 MG/1.7ML ~~LOC~~ SOLN
120.0000 mg | SUBCUTANEOUS | Status: DC
  Administered 2017-10-14: 120 mg via SUBCUTANEOUS
  Filled 2017-10-14: qty 1.7

## 2017-10-16 LAB — MULTIPLE MYELOMA PANEL, SERUM
ALPHA 1: 0.1 g/dL (ref 0.0–0.4)
ALPHA2 GLOB SERPL ELPH-MCNC: 0.7 g/dL (ref 0.4–1.0)
Albumin SerPl Elph-Mcnc: 3.8 g/dL (ref 2.9–4.4)
Albumin/Glob SerPl: 1.6 (ref 0.7–1.7)
B-Globulin SerPl Elph-Mcnc: 0.9 g/dL (ref 0.7–1.3)
Gamma Glob SerPl Elph-Mcnc: 0.8 g/dL (ref 0.4–1.8)
Globulin, Total: 2.5 g/dL (ref 2.2–3.9)
IGG (IMMUNOGLOBIN G), SERUM: 845 mg/dL (ref 700–1600)
IGM (IMMUNOGLOBULIN M), SRM: 44 mg/dL (ref 20–172)
IgA: 163 mg/dL (ref 90–386)
TOTAL PROTEIN ELP: 6.3 g/dL (ref 6.0–8.5)

## 2017-10-16 NOTE — Progress Notes (Signed)
Hematology/Oncology Consult note [facility]  Telephone:([phone] [phone]  Patient Care Team: Mock, Colon Branch as PCP - General (Family Medicine) Vella Redhead, MD as PCP - Hematology/Oncology   Name of the patient: James House  [id number]  [birth year]   Date of visit: 10/16/17  Diagnosis- light chain kappa multiple myeloma s/p ASCT currently in remission. Patient on maintenance revlimid    Chief complaint/ Reason for visit- routine f/u of myeloma on maintenance revlimid  Heme/Onc history: patient is a 47 year old male with a history of kappa light chain myeloma that was treated by [name]. History is as follows:  1. Patient presented with renal insufficiency with a creatinine of 2.4 in April 2018. Kidney biopsy showed myeloma cast nephropathy. Kappa light chain was 3004 and lambda 0.22 with a kappa lambda ratio of 13,000 655. Skeletal survey showed multiple calvarial and marrow lesions. Bone marrow biopsy in May 2018 showed 43% plasma cells. He received CyBorDfor cycle 1 in May 2018 and was subsequently switched to RVD. He received 4 cycles followed by a repeat bone marrow biopsy in August 2018 which showed no increase in plasma cells.  2.He underwent autologous stem cell transplantation on 01/30/2017. He was started on Revlimid maintenance 10 mg daily in January 2019.  3.Labs from January February and March 2019 done at [facility] showed no M spike, normal kappa lambda ratio of 1.06. He was last seen by them in April 2019.  Following that patient was admitted to [facility] for healthcare associated pneumonia and was recently discharged. He wished to transfer his care to [place] at this time.  With regards to his myeloma patient is currently on 10 mg of Revlimid daily. He is also on acyclovir 400 mg twice daily which he is to continue for a minimum of 1 year post transplant. He has also been advised by [facility] to continue aspirin  162 mg daily along with pantoprazole. Plan was to start Zometa pending dental clearance. 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 He also gets OxyContin and oxycodone for his neuropathy through pain clinic. He is on Zanaflex for his chronic back pain. He also gets trazodone for his insomnia.    Interval history- Patient does not wish to go to pain clinic. He is off tramadol and oxycodone and says that his symptoms are no better no worse. He is on lyrica and neurontin. Reports difficulty sleeping at night due to neuropathy. Fatigue is stable. Denies other complaints  ECOG PS- 1 Pain scale- 4   Review of systems- Review of Systems  Constitutional: Positive for malaise/fatigue. Negative for chills, fever and weight loss.  HENT: Negative for congestion, ear discharge and nosebleeds.   Eyes: Negative for blurred vision.  Respiratory: Negative for cough, hemoptysis, sputum production, shortness of breath and wheezing.   Cardiovascular: Negative for chest pain, palpitations, orthopnea and claudication.  Gastrointestinal: Negative for abdominal pain, blood in stool, constipation, diarrhea, heartburn, melena, nausea and vomiting.  Genitourinary: Negative for dysuria, flank pain, frequency, hematuria and urgency.  Musculoskeletal: Negative for back pain, joint pain and myalgias.  Skin: Negative for rash.  Neurological: Positive for tingling. Negative for dizziness, focal weakness, seizures, weakness and headaches.  Endo/Heme/Allergies: Does not bruise/bleed easily.  Psychiatric/Behavioral: Negative for depression and suicidal ideas. The patient has insomnia.  No Known Allergies   Past Medical History:  Diagnosis Date  . Cancer ([place])   . Hypertension   . Multiple myeloma ([place]) 08/11/2017     Past Surgical History:  Procedure  Laterality Date  . ABDOMINAL SURGERY    . JOINT REPLACEMENT     right knee  . KNEE SURGERY Left     Social History   Socioeconomic History  . Marital status: Married    Spouse name: [name]  . Number of children: 3  . Years of education: Not on file  . Highest education level: Not on file  Occupational History  . Not on file  Social Needs  . Financial resource strain: Not hard at all  . Food insecurity:    Worry: Never true    Inability: Never true  . Transportation needs:    Medical: No    Non-medical: No  Tobacco Use  . Smoking status: Former Smoker    Last attempt to quit: 08/19/2013    Years since quitting: 4.1  . Smokeless tobacco: Current User    Types: Snuff  Substance and Sexual Activity  . Alcohol use: No  . Drug use: No  . Sexual activity: Yes  Lifestyle  . Physical activity:    Days per week: 7 days    Minutes per session: 30 min  . Stress: Only a little  Relationships  . Social connections:    Talks on phone: Once a week    Gets together: Once a week    Attends religious service: More than 4 times per year    Active member of club or organization: No    Attends meetings of clubs or organizations: Never    Relationship status: Married  . Intimate partner violence:    Fear of current or ex partner: No    Emotionally abused: No    Physically abused: No    Forced sexual activity: No  Other Topics Concern  . Not on file  Social History Narrative   Live in private residence with spouse and mother in law    Family History  Problem Relation Age of Onset  . Cancer Mother 77       Pancreatic  . Cancer Maternal Uncle        pancreatic  . Cancer Maternal Grandmother 39       colon     Current Outpatient Medications:  .  acyclovir (ZOVIRAX) 200 MG capsule, Take 200 mg by mouth 2 (two) times daily. , Disp: , Rfl:  .  aspirin EC 81 MG tablet, Take 81 mg by mouth daily., Disp: , Rfl:  .  DULoxetine (CYMBALTA) 60 MG capsule, Take 60 mg by mouth at  bedtime. , Disp: , Rfl:  .  lenalidomide (REVLIMID) 5 MG capsule, Take 1 capsule (5 mg total) by mouth daily for 28 days., Disp: 28 capsule, Rfl: 2 .  pantoprazole (PROTONIX) 40 MG tablet, Take 40 mg by mouth daily., Disp: , Rfl:  .  pregabalin (LYRICA) 150 MG capsule, Take 150 mg by mouth 2 (two) times daily., Disp: , Rfl:  .  traZODone (DESYREL) 50 MG tablet, Take 50 mg by mouth at bedtime. , Disp: , Rfl:  .  Vitamin D, Ergocalciferol, (DRISDOL) 50000 units CAPS capsule, Take 50,000 Units by mouth every 7 (seven) days. Takes on Monday, Disp: , Rfl:  .  zolpidem (AMBIEN) 5 MG tablet, Take 1 tablet (5 mg total) by mouth at bedtime., Disp: 30 tablet, Rfl: 0 .  acetaminophen (TYLENOL) 325 MG tablet, Take 2 tablets (650 mg total) by mouth every 6 (six) hours as needed for mild pain (or Fever >/= 101). (Patient not taking: Reported on 10/14/2017), Disp: , Rfl:  .  gabapentin (NEURONTIN) 300 MG capsule, TAKE 1 CAPSULE BY MOUTH THREE TIMES A DAY, Disp: , Rfl: 11 .  oxyCODONE (OXY IR/ROXICODONE) 5 MG immediate release tablet, Take 1-2 tablets (5-10 mg total) by mouth every 6 (six) hours as needed for moderate pain, severe pain or breakthrough pain. (Patient not taking: Reported on 10/14/2017), Disp: 30 tablet, Rfl: 0 .  prochlorperazine (COMPAZINE) 10 MG tablet, Take 10 mg by mouth every 6 (six) hours as needed for nausea or vomiting., Disp: , Rfl:  .  sildenafil (REVATIO) 20 MG tablet, 2-5 tabs 1 hour prior to intercourse (Patient not taking: Reported on 10/14/2017), Disp: 30 tablet, Rfl: 0 .  tiZANidine (ZANAFLEX) 4 MG tablet, Take 4 mg by mouth every 8 (eight) hours as needed for muscle spasms., Disp: , Rfl:  No current facility-administered medications for this visit.   Facility-Administered Medications Ordered in Other Visits:  .  denosumab (XGEVA) injection 120 mg, 120 mg, Subcutaneous, Q28 days, Sindy Guadeloupe, MD, 120 mg at 10/14/17 1621  Physical exam:  Vitals:   10/14/17 1451  BP: (!) 136/91    Pulse: 86  Resp: 18  Temp: 98.6 F (37 C)  TempSrc: Tympanic  SpO2: 100%  Weight: 157 lb 8 oz (71.4 kg)  Height: '5\' 7"'  (1.702 m)   Physical Exam  Constitutional: He is oriented to person, place, and time.  Thin young man in no acute distress  HENT:  Head: Normocephalic and atraumatic.  Eyes: Pupils are equal, round, and reactive to light. EOM are normal.  Neck: Normal range of motion.  Cardiovascular: Normal rate, regular rhythm and normal heart sounds.  Pulmonary/Chest: Effort normal and breath sounds normal.  Abdominal: Soft. Bowel sounds are normal.  Neurological: He is alert and oriented to person, place, and time.  Skin: Skin is warm and dry.     CMP Latest Ref Rng & Units 10/14/2017  Glucose 70 - 99 mg/dL 99  BUN 6 - 20 mg/dL 15  Creatinine 0.61 - 1.24 mg/dL 1.77(H)  Sodium 135 - 145 mmol/L 137  Potassium 3.5 - 5.1 mmol/L 3.7  Chloride 98 - 111 mmol/L 103  CO2 22 - 32 mmol/L 25  Calcium 8.9 - 10.3 mg/dL 9.2  Total Protein 6.5 - 8.1 g/dL 6.9  Total Bilirubin 0.3 - 1.2 mg/dL 0.8  Alkaline Phos 38 - 126 U/L 115  AST 15 - 41 U/L 29  ALT 0 - 44 U/L 22   CBC Latest Ref Rng & Units 10/14/2017  WBC 3.8 - 10.6 K/uL 5.0  Hemoglobin 13.0 - 18.0 g/dL 13.7  Hematocrit 40.0 - 52.0 % 38.7(L)  Platelets 150 - 440 K/uL 231     Assessment and plan- Patient is a 47 y.o. male with kappa light chain multiple myeloma status post RVD chemotherapy followed by autologous stem cell transplantation in August 2018 currently in remission and on maintenance Revlimid here for routine f/u of myeloma   1. Multiple myeloma- continue maintenance revlimid 5 mg daily. Serum creatinine stable. Myeloma labs from today are pending  2. He has received his dental clearance and will start monthly xgeva today.  3. Chemo induced peripheral neuropathy- ideally he should only be on neurontin or lyrica and not both. He is off narcotics and does not wish to go  back to pain clinic  4. Re evaluate insomnia  at next visit. If It does nt improve with Lorrin Mais, it would be better to stop it  I will see him back in 4 weeks with cbc/cmp/myeloma panel/ serum free light chain. Also gets xgeva     Visit Diagnosis 1. Multiple myeloma in remission ([place])   2. High risk medication use   3. Long term (current) use of bisphosphonates      Dr. Randa Evens, MD, MPH [facility] at [facility] [phone] 10/16/2017 9:41 AM

## 2017-10-17 ENCOUNTER — Telehealth: Payer: Self-pay

## 2017-10-17 NOTE — Telephone Encounter (Signed)
-----   Message from Abbie Sons, MD sent at 10/16/2017 11:01 AM EDT ----- Testosterone level was normal at 597.  Would recommend a repeat morning free and total testosterone and LH.

## 2017-10-17 NOTE — Telephone Encounter (Signed)
Pt informed, please schedule lab appointment, early morning.

## 2017-10-24 ENCOUNTER — Other Ambulatory Visit: Payer: Medicaid Other

## 2017-10-24 ENCOUNTER — Ambulatory Visit: Payer: Medicaid Other | Admitting: Oncology

## 2017-10-24 ENCOUNTER — Ambulatory Visit: Payer: Medicaid Other

## 2017-10-27 ENCOUNTER — Telehealth: Payer: Self-pay | Admitting: Pharmacy Technician

## 2017-10-27 DIAGNOSIS — C9001 Multiple myeloma in remission: Secondary | ICD-10-CM

## 2017-10-27 MED ORDER — LENALIDOMIDE 5 MG PO CAPS: 5.0000 mg | ORAL_CAPSULE | Freq: Every day | ORAL | 0 refills | Status: DC

## 2017-10-27 NOTE — Telephone Encounter (Signed)
[name]  Spoke to patient over the phone to complete application for [employer] Patient Support in an effort to reduce patient's out of pocket expense for Revlimid to $0.    Application completed and submitted online.   [employer] patient assistance phone number for follow up is [phone].   This encounter will be updated until final determination.   [place] Patient [place] Phone [phone] Fax [phone] 10/27/2017 9:42 AM

## 2017-10-27 NOTE — Telephone Encounter (Signed)
Oral Chemotherapy Pharmacist Encounter   Revlimid prescription was e-scribed to [facility] by [employer]. [employer] auth # [id number].    Darl Pikes, PharmD, BCPS, [facility] Hematology/Oncology Clinical Pharmacist [facility] [place] [phone]  10/27/2017 12:25 PM

## 2017-10-27 NOTE — Telephone Encounter (Signed)
[name]  Received notification from [employer] Patient Assistance program that patient has been successfully enrolled into their program to receive Revlimid from the manufacturer at $0 out of pocket until 04/21/18.   I called and spoke with patient.   Patient knows to call the office with questions or concerns.  Oral Oncology Clinic will continue to follow.  [place] Patient [place] Phone [phone] Fax [phone] 10/27/2017 12:05 PM

## 2017-10-30 ENCOUNTER — Ambulatory Visit: Payer: No Typology Code available for payment source | Attending: Nurse Practitioner | Admitting: Nurse Practitioner

## 2017-10-30 ENCOUNTER — Other Ambulatory Visit: Payer: Self-pay

## 2017-10-30 DIAGNOSIS — E291 Testicular hypofunction: Secondary | ICD-10-CM

## 2017-10-31 ENCOUNTER — Encounter: Payer: Self-pay | Admitting: Primary Care

## 2017-10-31 ENCOUNTER — Encounter (INDEPENDENT_AMBULATORY_CARE_PROVIDER_SITE_OTHER): Payer: Self-pay

## 2017-10-31 ENCOUNTER — Ambulatory Visit (INDEPENDENT_AMBULATORY_CARE_PROVIDER_SITE_OTHER): Payer: No Typology Code available for payment source | Admitting: Primary Care

## 2017-10-31 VITALS — BP 120/76 | HR 86 | Temp 98.3°F | Ht 68.0 in | Wt 158.5 lb

## 2017-10-31 DIAGNOSIS — I10 Essential (primary) hypertension: Secondary | ICD-10-CM

## 2017-10-31 DIAGNOSIS — E538 Deficiency of other specified B group vitamins: Secondary | ICD-10-CM | POA: Insufficient documentation

## 2017-10-31 DIAGNOSIS — N183 Chronic kidney disease, stage 3 unspecified: Secondary | ICD-10-CM

## 2017-10-31 DIAGNOSIS — G629 Polyneuropathy, unspecified: Secondary | ICD-10-CM | POA: Diagnosis not present

## 2017-10-31 DIAGNOSIS — C9001 Multiple myeloma in remission: Secondary | ICD-10-CM

## 2017-10-31 LAB — VITAMIN B12: VITAMIN B 12: 248 pg/mL (ref 200–1100)

## 2017-10-31 LAB — TESTOSTERONE: Testosterone: 437 ng/dL (ref 264–916)

## 2017-10-31 LAB — FSH/LH
FSH: 16 m[IU]/mL — AB (ref 1.5–12.4)
LH: 8.1 m[IU]/mL (ref 1.7–8.6)

## 2017-10-31 NOTE — Progress Notes (Signed)
Subjective:    Patient ID: [name], male    DOB: [birth year], 47 y.o.   MRN: [id number]  HPI  Mr. Adekunle Rohrbach is a 47 year old male who presents today to establish care and discuss the problems mentioned below. Will obtain old records.  1) Essential Hypertension: Previously managed on Amlodipine 10 mg for which he stopped taking several years ago as he didn't want to take any longer. He does not check his BP at home.   BP Readings from Last 3 Encounters:  10/31/17 120/76  10/14/17 (!) 136/91  10/07/17 (!) 167/93   2) Insomnia: Currently managed on Ambien 5 mg for which he's taken for quite some time for chronic neuropathy. Based off of a chart review from his oncologist he is taking Trazodone although Ambien was called in by oncologist.   3) Multiple Myeloma: Diagnosed in April 2018 after complaints of recurrent lower back pain. He underwent renal biopsy of his left kidney which revealed myeloma cast nephropathy. Underwent chemotherapy and stem cell transplant. Currently managed on Revlimid 5 mg. He is following with Dr. Janese Banks through [facility] and is needing a referral for insurance purposes.   He is prescribed medication for neuropathy which was Velcade induced. He is also taking acyclovir 200 mg BID for shingles prevention. Managed on pantoprazole 40 mg.   4) Neuropathy: Occurs to bilateral hands and feet. Velcade induced. Currently managed on gabapentin 600 mg TID and Lyrica 150 mg BID per patient. He doesn't feel as though his gabapentin is working, he feels as though is body has become immune. His PCP called in Tramadol ER 100 mg for which he's not yet picked up.  Based off of a chart review from his recent oncology visit, his oncologist is under the impression that he is following with pain management and is prescribed Oxycontin, oxycodone, gabapentin 900 mg TID and Cymbalta 60 mg. He did not endorse this today. He did endorse today that he was once on Cymbalta and seemed open to  try, he did not mention that he was once taking this in the past.   5) CKD Stage III: Chronic for years. Recent creatinine of 1.77 with GFR of 44.   Review of Systems  Constitutional: Negative for unexpected weight change.  Eyes: Negative for visual disturbance.  Respiratory: Negative for shortness of breath.   Cardiovascular: Negative for chest pain.  Genitourinary:       Erectile dysfunction  Neurological:       Chronic neuropathy  Psychiatric/Behavioral: Positive for sleep disturbance.       Past Medical History:  Diagnosis Date  . CKD (chronic kidney disease) stage 3, GFR 30-59 ml/min (H[facility])   . Hypertension   . Multiple myeloma ([place]) 08/11/2017  . Neuropathy   . Pneumonia      Social History   Socioeconomic History  . Marital status: Married    Spouse name: [name]  . Number of children: 3  . Years of education: Not on file  . Highest education level: Not on file  Occupational History  . Not on file  Social Needs  . Financial resource strain: Not hard at all  . Food insecurity:    Worry: Never true    Inability: Never true  . Transportation needs:    Medical: No    Non-medical: No  Tobacco Use  . Smoking status: Former Smoker    Last attempt to quit: 08/19/2013    Years since quitting: 4.2  .  Smokeless tobacco: Current User    Types: Snuff  Substance and Sexual Activity  . Alcohol use: No  . Drug use: No  . Sexual activity: Yes  Lifestyle  . Physical activity:    Days per week: 7 days    Minutes per session: 30 min  . Stress: Only a little  Relationships  . Social connections:    Talks on phone: Once a week    Gets together: Once a week    Attends religious service: More than 4 times per year    Active member of club or organization: No    Attends meetings of clubs or organizations: Never    Relationship status: Married  . Intimate partner violence:    Fear of current or ex partner: No    Emotionally abused: No    Physically abused: No    Forced  sexual activity: No  Other Topics Concern  . Not on file  Social History Narrative   Live in private residence with spouse and mother in law    Past Surgical History:  Procedure Laterality Date  . ABDOMINAL SURGERY    . JOINT REPLACEMENT     right knee  . KNEE SURGERY Left     Family History  Problem Relation Age of Onset  . Cancer Mother 1       Pancreatic  . COPD Mother   . Diabetes Mother   . Hyperlipidemia Mother   . Hypertension Mother   . Cancer Maternal Uncle        pancreatic  . Cancer Maternal Grandmother 78       colon  . COPD Maternal Grandmother   . Diabetes Maternal Grandmother   . Hypertension Maternal Grandmother     No Known Allergies  Current Outpatient Medications on File Prior to Visit  Medication Sig Dispense Refill  . acyclovir (ZOVIRAX) 200 MG capsule Take 200 mg by mouth 2 (two) times daily.     Marland Kitchen aspirin EC 81 MG tablet Take 81 mg by mouth daily.    Marland Kitchen gabapentin (NEURONTIN) 600 MG tablet Take 600 mg by mouth 3 (three) times daily.    Marland Kitchen lenalidomide (REVLIMID) 5 MG capsule Take 1 capsule (5 mg total) by mouth daily. 28 capsule 0  . pantoprazole (PROTONIX) 40 MG tablet Take 40 mg by mouth daily.    . pregabalin (LYRICA) 150 MG capsule Take 150 mg by mouth 2 (two) times daily.    . sildenafil (REVATIO) 20 MG tablet 2-5 tabs 1 hour prior to intercourse 30 tablet 0  . tiZANidine (ZANAFLEX) 4 MG tablet Take 4 mg by mouth every 8 (eight) hours as needed for muscle spasms.    . Vitamin D, Ergocalciferol, (DRISDOL) 50000 units CAPS capsule Take 50,000 Units by mouth every 7 (seven) days. Takes on Monday    . zolpidem (AMBIEN) 5 MG tablet Take 1 tablet (5 mg total) by mouth at bedtime. 30 tablet 0  . acetaminophen (TYLENOL) 325 MG tablet Take 2 tablets (650 mg total) by mouth every 6 (six) hours as needed for mild pain (or Fever >/= 101). (Patient not taking: Reported on 10/14/2017)     No current facility-administered medications on file prior to visit.      BP 120/76   Pulse 86   Temp 98.3 F (36.8 C) (Oral)   Ht '5\' 8"'  (1.727 m)   Wt 158 lb 8 oz (71.9 kg)   SpO2 98%   BMI 24.10 kg/m    Objective:  Physical Exam  Constitutional: He appears well-nourished.  Neck: Neck supple.  Cardiovascular: Normal rate and regular rhythm.  Respiratory: Effort normal and breath sounds normal.  Skin: Skin is warm and dry.  Psychiatric: He has a normal mood and affect.           Assessment & Plan:

## 2017-10-31 NOTE — Assessment & Plan Note (Signed)
Improved creatinine on recent labs. Continue to avoid nephrotoxic agents.

## 2017-10-31 NOTE — Assessment & Plan Note (Signed)
Stable in the office today. Recommended he begin to monitor BP at home just to ensure normal levels. Discussed importance of good BP control especially given CKD and multiple myeloma.

## 2017-10-31 NOTE — Assessment & Plan Note (Addendum)
Chronic and induced by chemotherapy drugs from multiple myeloma. He did not endorse today that he was once on OxyContin and Oxycodone for chronic pain. Based off of a search on PMP aware his last prescription for Oxycodone was in May 2019 from a provider in [place], and OxyContin was in April 2019 from a provider in [place].   He was prescribed Tramadol 50 mg for which he did fill in May 2019. This is contradictory to what he mentioned today.  For this reason I will defer his chronic pain to his pain management MD, including Lyrica. He will be notified of this via My Chart.   Checked B12 today given persistent neuropathy as he is not taking OTC B12.

## 2017-10-31 NOTE — Patient Instructions (Signed)
Stop by the lab prior to leaving today. I will notify you of your results once received.   I'll be in touch with your cancer doctor to determine further treatment.  It was a pleasure to meet you today! Please don't hesitate to call or message me with any questions. Welcome to [facility]!

## 2017-10-31 NOTE — Assessment & Plan Note (Signed)
Check B12 today, this could be contributing to neuropathy. He is not taking OTC B12.

## 2017-10-31 NOTE — Assessment & Plan Note (Signed)
Diagnosed in April 2018 from renal biopsy. Following with oncology, Dr. Janese Banks, with recent visit on 10/14/17. Continue current regimen.

## 2017-11-03 ENCOUNTER — Encounter: Payer: Self-pay | Admitting: Primary Care

## 2017-11-03 DIAGNOSIS — C9001 Multiple myeloma in remission: Secondary | ICD-10-CM

## 2017-11-03 DIAGNOSIS — G629 Polyneuropathy, unspecified: Secondary | ICD-10-CM

## 2017-11-11 ENCOUNTER — Inpatient Hospital Stay: Payer: No Typology Code available for payment source

## 2017-11-11 ENCOUNTER — Other Ambulatory Visit: Payer: Self-pay | Admitting: *Deleted

## 2017-11-11 ENCOUNTER — Encounter: Payer: Self-pay | Admitting: Oncology

## 2017-11-11 ENCOUNTER — Inpatient Hospital Stay: Payer: No Typology Code available for payment source | Attending: Oncology | Admitting: Oncology

## 2017-11-11 VITALS — BP 159/94 | HR 78 | Temp 97.4°F | Resp 18 | Ht 68.0 in | Wt 158.7 lb

## 2017-11-11 DIAGNOSIS — G620 Drug-induced polyneuropathy: Secondary | ICD-10-CM | POA: Diagnosis not present

## 2017-11-11 DIAGNOSIS — Z9484 Stem cells transplant status: Secondary | ICD-10-CM | POA: Insufficient documentation

## 2017-11-11 DIAGNOSIS — Z79899 Other long term (current) drug therapy: Secondary | ICD-10-CM | POA: Diagnosis not present

## 2017-11-11 DIAGNOSIS — I10 Essential (primary) hypertension: Secondary | ICD-10-CM | POA: Diagnosis not present

## 2017-11-11 DIAGNOSIS — G4700 Insomnia, unspecified: Secondary | ICD-10-CM | POA: Insufficient documentation

## 2017-11-11 DIAGNOSIS — T451X5A Adverse effect of antineoplastic and immunosuppressive drugs, initial encounter: Secondary | ICD-10-CM | POA: Insufficient documentation

## 2017-11-11 DIAGNOSIS — F17290 Nicotine dependence, other tobacco product, uncomplicated: Secondary | ICD-10-CM | POA: Diagnosis not present

## 2017-11-11 DIAGNOSIS — C9001 Multiple myeloma in remission: Secondary | ICD-10-CM

## 2017-11-11 LAB — CBC WITH DIFFERENTIAL/PLATELET
Basophils Absolute: 0 10*3/uL (ref 0–0.1)
Basophils Relative: 1 %
Eosinophils Absolute: 0.2 10*3/uL (ref 0–0.7)
Eosinophils Relative: 4 %
HEMATOCRIT: 44.6 % (ref 40.0–52.0)
HEMOGLOBIN: 15.1 g/dL (ref 13.0–18.0)
LYMPHS PCT: 14 %
Lymphs Abs: 0.6 10*3/uL — ABNORMAL LOW (ref 1.0–3.6)
MCH: 33.1 pg (ref 26.0–34.0)
MCHC: 33.9 g/dL (ref 32.0–36.0)
MCV: 97.5 fL (ref 80.0–100.0)
Monocytes Absolute: 0.3 10*3/uL (ref 0.2–1.0)
Monocytes Relative: 7 %
NEUTROS ABS: 3.1 10*3/uL (ref 1.4–6.5)
NEUTROS PCT: 74 %
Platelets: 195 10*3/uL (ref 150–440)
RBC: 4.57 MIL/uL (ref 4.40–5.90)
RDW: 14.7 % — ABNORMAL HIGH (ref 11.5–14.5)
WBC: 4.2 10*3/uL (ref 3.8–10.6)

## 2017-11-11 LAB — COMPREHENSIVE METABOLIC PANEL
ALBUMIN: 4.2 g/dL (ref 3.5–5.0)
ALT: 24 U/L (ref 0–44)
AST: 29 U/L (ref 15–41)
Alkaline Phosphatase: 116 U/L (ref 38–126)
Anion gap: 9 (ref 5–15)
BILIRUBIN TOTAL: 0.9 mg/dL (ref 0.3–1.2)
BUN: 17 mg/dL (ref 6–20)
CO2: 22 mmol/L (ref 22–32)
Calcium: 8.7 mg/dL — ABNORMAL LOW (ref 8.9–10.3)
Chloride: 106 mmol/L (ref 98–111)
Creatinine, Ser: 1.61 mg/dL — ABNORMAL HIGH (ref 0.61–1.24)
GFR calc Af Amer: 58 mL/min — ABNORMAL LOW (ref >60)
GFR calc non Af Amer: 50 mL/min — ABNORMAL LOW (ref >60)
Glucose, Bld: 130 mg/dL — ABNORMAL HIGH (ref 70–99)
POTASSIUM: 4.1 mmol/L (ref 3.5–5.1)
Sodium: 137 mmol/L (ref 135–145)
Total Protein: 7.3 g/dL (ref 6.5–8.1)

## 2017-11-11 MED ORDER — OXYCODONE HCL 5 MG PO TABS: 5.0000 mg | ORAL_TABLET | Freq: Four times a day (QID) | ORAL | 0 refills | Status: DC | PRN

## 2017-11-11 MED ORDER — ZOLPIDEM TARTRATE 5 MG PO TABS: 5.0000 mg | ORAL_TABLET | Freq: Every day | ORAL | 0 refills | Status: DC

## 2017-11-11 NOTE — Progress Notes (Signed)
Hematology/Oncology Consult note [facility]  [phone] [phone]  Patient Care Team: Pleas Koch, NP as PCP - General (Internal Medicine) Vella Redhead, MD as PCP - Hematology/Oncology   Name of the patient: James House  [id number]  May 29, 1970   Date of visit: 11/11/17  Diagnosis- light chain kappa multiple myeloma s/p ASCT currently in remission. Patient on maintenance revlimid   Chief complaint/ Reason for visit- routine f/u of myeloma  Heme/Onc history: patient is a 47 year old male with a history of kappa light chain myeloma that was treated by Dr. Naomie House. History is as follows:  1. Patient presented with renal insufficiency with a creatinine of 2.4 in April 2018. Kidney biopsy showed myeloma cast nephropathy. Kappa light chain was 3004 and lambda 0.22 with a kappa lambda ratio of 13,000 655. Skeletal survey showed multiple calvarial and marrow lesions. Bone marrow biopsy in May 2018 showed 43% plasma cells. He received CyBorDfor cycle 1 in May 2018 and was subsequently switched to RVD. He received 4 cycles followed by a repeat bone marrow biopsy in August 2018 which showed no increase in plasma cells.  2.He underwent autologous stem cell transplantation on 01/30/2017. He was started on Revlimid maintenance 10 mg daily in January 2019.  3.Labs from January February and March 2019 done at [facility] showed no M spike, normal kappa lambda ratio of 1.06. He was last seen by them in April 2019.  Following that patient was admitted to [facility] for healthcare associated pneumonia and was recently discharged. He wished to transfer his care to [place] at this time.  With regards to his myeloma patient is currently on 10 mg of Revlimid daily. He is also on acyclovir 400 mg twice daily which he is to continue for a minimum of 1 year post transplant. He has also been advised by [facility] to continue aspirin 162 mg daily along with  pantoprazole. Plan was to start Zometapending dentalclearance. 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 He also gets OxyContin and oxycodone for his neuropathy through pain clinic. He is on Zanaflex for his chronic back pain. He also gets trazodone for his insomnia.     Interval history-he has not been on any narcotic medication or tramadol for about 3 months now.  He is only been taking gabapentin and Lyrica and reports that his neuropathic pain is getting worse.  He is currently on Ambien for his insomnia which is working well for him.  He did change his insurance from [place] to [facility] health and therefore had to change all his providers.  Currently [facility] is his primary care doctor and he had to be re-referred to [place].  He is also in the process of reestablishing himself at the pain clinic at [facility].  He has chronic fatigue.  Continues to take Revlimid 5 mg daily  ECOG PS- 1 Pain scale- 8 Opioid associated constipation- no  Review of systems- Review of Systems  Constitutional: Positive for malaise/fatigue. Negative for chills, fever and weight loss.  HENT: Negative for congestion, ear discharge and nosebleeds.   Eyes: Negative for blurred vision.  Respiratory: Negative for cough, hemoptysis, sputum production, shortness of breath and wheezing.   Cardiovascular: Negative for chest pain, palpitations, orthopnea and claudication.  Gastrointestinal: Negative for abdominal pain, blood in stool, constipation, diarrhea, heartburn, melena,  nausea and vomiting.  Genitourinary: Negative for dysuria, flank pain, frequency, hematuria and urgency.  Musculoskeletal: Negative for back pain, joint pain and myalgias.  Skin: Negative for rash.  Neurological: Positive for tingling. Negative for dizziness, focal weakness, seizures, weakness and  headaches.  Endo/Heme/Allergies: Does not bruise/bleed easily.  Psychiatric/Behavioral: Negative for depression and suicidal ideas. The patient does not have insomnia.      No Known Allergies   Past Medical History:  Diagnosis Date  . CKD (chronic kidney disease) stage 3, GFR 30-59 ml/min (HCC)   . Hypertension   . Multiple myeloma ([place]) 08/11/2017  . Neuropathy   . Pneumonia      Past Surgical History:  Procedure Laterality Date  . ABDOMINAL SURGERY    . JOINT REPLACEMENT     right knee  . KNEE SURGERY Left     Social History   Socioeconomic History  . Marital status: Married    Spouse name: [facility]  . Number of children: 3  . Years of education: Not on file  . Highest education level: Not on file  Occupational History  . Not on file  Social Needs  . Financial resource strain: Not hard at all  . Food insecurity:    Worry: Never true    Inability: Never true  . Transportation needs:    Medical: No    Non-medical: No  Tobacco Use  . Smoking status: Former Smoker    Last attempt to quit: 08/19/2013    Years since quitting: 4.2  . Smokeless tobacco: Current User    Types: Snuff  Substance and Sexual Activity  . Alcohol use: No  . Drug use: No  . Sexual activity: Yes  Lifestyle  . Physical activity:    Days per week: 7 days    Minutes per session: 30 min  . Stress: Only a little  Relationships  . Social connections:    Talks on phone: Once a week    Gets together: Once a week    Attends religious service: More than 4 times per year    Active member of club or organization: No    Attends meetings of clubs or organizations: Never    Relationship status: Married  . Intimate partner violence:    Fear of current or ex partner: No    Emotionally abused: No    Physically abused: No    Forced sexual activity: No  Other Topics Concern  . Not on file  Social History Narrative   Live in private residence with spouse and mother in law    Family History    Problem Relation Age of Onset  . Cancer Mother 14       Pancreatic  . COPD Mother   . Diabetes Mother   . Hyperlipidemia Mother   . Hypertension Mother   . Cancer Maternal Uncle        pancreatic  . Cancer Maternal Grandmother 100       colon  . COPD Maternal Grandmother   . Diabetes Maternal Grandmother   . Hypertension Maternal Grandmother      Current Outpatient Medications:  .  acyclovir (ZOVIRAX) 200 MG capsule, Take 200 mg by mouth 2 (two) times daily. , Disp: , Rfl:  .  aspirin EC 81 MG tablet, Take 81 mg by mouth daily., Disp: , Rfl:  .  gabapentin (NEURONTIN) 600 MG tablet, Take 600 mg by mouth 3 (three) times daily., Disp: , Rfl:  .  lenalidomide (REVLIMID) 5 MG  capsule, Take 1 capsule (5 mg total) by mouth daily., Disp: 28 capsule, Rfl: 0 .  pantoprazole (PROTONIX) 40 MG tablet, Take 40 mg by mouth daily., Disp: , Rfl:  .  pregabalin (LYRICA) 150 MG capsule, Take 150 mg by mouth 2 (two) times daily., Disp: , Rfl:  .  sildenafil (REVATIO) 20 MG tablet, 2-5 tabs 1 hour prior to intercourse, Disp: 30 tablet, Rfl: 0 .  tiZANidine (ZANAFLEX) 4 MG tablet, Take 4 mg by mouth every 8 (eight) hours as needed for muscle spasms., Disp: , Rfl:  .  Vitamin D, Ergocalciferol, (DRISDOL) 50000 units CAPS capsule, Take 50,000 Units by mouth every 7 (seven) days. Takes on Monday, Disp: , Rfl:  .  zolpidem (AMBIEN) 5 MG tablet, Take 1 tablet (5 mg total) by mouth at bedtime., Disp: 30 tablet, Rfl: 0 .  acetaminophen (TYLENOL) 325 MG tablet, Take 2 tablets (650 mg total) by mouth every 6 (six) hours as needed for mild pain (or Fever >/= 101). (Patient not taking: Reported on 10/14/2017), Disp: , Rfl:  .  oxyCODONE (OXY IR/ROXICODONE) 5 MG immediate release tablet, Take 1 tablet (5 mg total) by mouth every 6 (six) hours as needed for severe pain., Disp: 120 tablet, Rfl: 0  Physical exam:  Vitals:   11/11/17 1046  BP: (!) 159/94  Pulse: 78  Resp: 18  Temp: (!) 97.4 F (36.3 C)  TempSrc:  Tympanic  SpO2: 100%  Weight: 158 lb 11.2 oz (72 kg)  Height: '5\' 8"'  (1.727 m)   Physical Exam  Constitutional: He is oriented to person, place, and time.  Thin young man in no acute distress  HENT:  Head: Normocephalic and atraumatic.  Eyes: Pupils are equal, round, and reactive to light. EOM are normal.  Neck: Normal range of motion.  Cardiovascular: Normal rate, regular rhythm and normal heart sounds.  Pulmonary/Chest: Effort normal and breath sounds normal.  Abdominal: Soft. Bowel sounds are normal.  Neurological: He is alert and oriented to person, place, and time.  Skin: Skin is warm and dry.     CMP Latest Ref Rng & Units 11/11/2017  Glucose 70 - 99 mg/dL 130(H)  BUN 6 - 20 mg/dL 17  Creatinine 0.61 - 1.24 mg/dL 1.61(H)  Sodium 135 - 145 mmol/L 137  Potassium 3.5 - 5.1 mmol/L 4.1  Chloride 98 - 111 mmol/L 106  CO2 22 - 32 mmol/L 22  Calcium 8.9 - 10.3 mg/dL 8.7(L)  Total Protein 6.5 - 8.1 g/dL 7.3  Total Bilirubin 0.3 - 1.2 mg/dL 0.9  Alkaline Phos 38 - 126 U/L 116  AST 15 - 41 U/L 29  ALT 0 - 44 U/L 24   CBC Latest Ref Rng & Units 11/11/2017  WBC 3.8 - 10.6 K/uL 4.2  Hemoglobin 13.0 - 18.0 g/dL 15.1  Hematocrit 40.0 - 52.0 % 44.6  Platelets 150 - 440 K/uL 195      Assessment and plan- Patient is a 47 y.o. male with kappa light chain multiple myeloma status post RVD chemotherapy followed by autologous stem cell transplantation in August 2018 currently in remission and on maintenance Revlimid.  he is here for routine follow-up of his myeloma  Patient will continue Revlimid 5 mg daily.  He has mild chronic fatigue but otherwise his counts are holding up well.  His creatinine is stable around 1.6.  Myeloma labs from today are pending I will see him back in about 4 weeks time with CBC CMP myeloma panel and serum free light  chains  Patient is currently on monthly Xgeva.  He was supposed to get his dose today.  However because of change in his insurance we need to get it  preauthorized and we will let him know when he can come back for his next dose of Xgeva.  Chemo-induced peripheral neuropathy: He will continue gabapentin.  We will call [facility].  I would like them to manage his pain as he was seen at [facility] pain clinic before I will give him a one-month prescription for oxycodone 5 mg every 6 hours as needed 120 tablets and no refills.    Insomnia continue Ambien   Visit Diagnosis 1. Multiple myeloma in remission ([place])   2. High risk medication use   3. Chemotherapy-induced neuropathy ([place])   4. Insomnia, unspecified type      Dr. Randa Evens, MD, MPH [facility] [phone] 11/11/2017 3:13 PM

## 2017-11-11 NOTE — Addendum Note (Signed)
Addended by: Randa Evens C on: 11/11/2017 03:21 PM   Modules accepted: Orders

## 2017-11-12 LAB — KAPPA/LAMBDA LIGHT CHAINS
KAPPA, LAMDA LIGHT CHAIN RATIO: 1.41 (ref 0.26–1.65)
Kappa free light chain: 17.1 mg/L (ref 3.3–19.4)
Lambda free light chains: 12.1 mg/L (ref 5.7–26.3)

## 2017-11-12 LAB — MULTIPLE MYELOMA PANEL, SERUM
ALBUMIN SERPL ELPH-MCNC: 3.6 g/dL (ref 2.9–4.4)
ALPHA 1: 0.2 g/dL (ref 0.0–0.4)
Albumin/Glob SerPl: 1.3 (ref 0.7–1.7)
Alpha2 Glob SerPl Elph-Mcnc: 0.9 g/dL (ref 0.4–1.0)
B-Globulin SerPl Elph-Mcnc: 1 g/dL (ref 0.7–1.3)
GLOBULIN, TOTAL: 2.9 g/dL (ref 2.2–3.9)
Gamma Glob SerPl Elph-Mcnc: 0.8 g/dL (ref 0.4–1.8)
IGA: 169 mg/dL (ref 90–386)
IgG (Immunoglobin G), Serum: 847 mg/dL (ref 700–1600)
IgM (Immunoglobulin M), Srm: 49 mg/dL (ref 20–172)
TOTAL PROTEIN ELP: 6.5 g/dL (ref 6.0–8.5)

## 2017-11-17 ENCOUNTER — Telehealth: Payer: Self-pay | Admitting: *Deleted

## 2017-11-17 ENCOUNTER — Encounter: Payer: Self-pay | Admitting: Primary Care

## 2017-11-17 NOTE — Telephone Encounter (Signed)
Called pain clinic to see if he could be rescheduled and since out referral to pain clinic another office sent referral for pain clinic. The case had been closed but annette reopened it and will call pt. And work out new appt

## 2017-11-26 ENCOUNTER — Other Ambulatory Visit: Payer: Self-pay

## 2017-11-26 ENCOUNTER — Encounter: Payer: Self-pay | Admitting: Nurse Practitioner

## 2017-11-26 ENCOUNTER — Ambulatory Visit
Admission: RE | Admit: 2017-11-26 | Discharge: 2017-11-26 | Disposition: A | Discharge status: Home / Self Care | Payer: No Typology Code available for payment source | Source: Ambulatory Visit | Attending: Nurse Practitioner | Admitting: Nurse Practitioner

## 2017-11-26 ENCOUNTER — Ambulatory Visit: Payer: No Typology Code available for payment source | Admitting: Nurse Practitioner

## 2017-11-26 ENCOUNTER — Ambulatory Visit: Payer: No Typology Code available for payment source | Attending: Nurse Practitioner | Admitting: Nurse Practitioner

## 2017-11-26 DIAGNOSIS — M25561 Pain in right knee: Secondary | ICD-10-CM | POA: Diagnosis not present

## 2017-11-26 DIAGNOSIS — Z79899 Other long term (current) drug therapy: Secondary | ICD-10-CM | POA: Diagnosis not present

## 2017-11-26 DIAGNOSIS — G8929 Other chronic pain: Secondary | ICD-10-CM

## 2017-11-26 DIAGNOSIS — M79605 Pain in left leg: Secondary | ICD-10-CM | POA: Diagnosis not present

## 2017-11-26 DIAGNOSIS — I129 Hypertensive chronic kidney disease with stage 1 through stage 4 chronic kidney disease, or unspecified chronic kidney disease: Secondary | ICD-10-CM | POA: Insufficient documentation

## 2017-11-26 DIAGNOSIS — Z8249 Family history of ischemic heart disease and other diseases of the circulatory system: Secondary | ICD-10-CM | POA: Insufficient documentation

## 2017-11-26 DIAGNOSIS — C9000 Multiple myeloma not having achieved remission: Secondary | ICD-10-CM | POA: Diagnosis not present

## 2017-11-26 DIAGNOSIS — E538 Deficiency of other specified B group vitamins: Secondary | ICD-10-CM | POA: Diagnosis not present

## 2017-11-26 DIAGNOSIS — G894 Chronic pain syndrome: Secondary | ICD-10-CM | POA: Diagnosis not present

## 2017-11-26 DIAGNOSIS — M5441 Lumbago with sciatica, right side: Secondary | ICD-10-CM | POA: Diagnosis not present

## 2017-11-26 DIAGNOSIS — M5442 Lumbago with sciatica, left side: Secondary | ICD-10-CM

## 2017-11-26 DIAGNOSIS — M25562 Pain in left knee: Secondary | ICD-10-CM

## 2017-11-26 DIAGNOSIS — N183 Chronic kidney disease, stage 3 (moderate): Secondary | ICD-10-CM | POA: Diagnosis not present

## 2017-11-26 DIAGNOSIS — M79604 Pain in right leg: Secondary | ICD-10-CM

## 2017-11-26 DIAGNOSIS — Z7982 Long term (current) use of aspirin: Secondary | ICD-10-CM | POA: Insufficient documentation

## 2017-11-26 DIAGNOSIS — G629 Polyneuropathy, unspecified: Secondary | ICD-10-CM | POA: Insufficient documentation

## 2017-11-26 DIAGNOSIS — Z5181 Encounter for therapeutic drug level monitoring: Secondary | ICD-10-CM | POA: Diagnosis not present

## 2017-11-26 DIAGNOSIS — Z720 Tobacco use: Secondary | ICD-10-CM | POA: Insufficient documentation

## 2017-11-26 DIAGNOSIS — Z96651 Presence of right artificial knee joint: Secondary | ICD-10-CM | POA: Diagnosis not present

## 2017-11-26 DIAGNOSIS — Z79891 Long term (current) use of opiate analgesic: Secondary | ICD-10-CM | POA: Diagnosis not present

## 2017-11-26 DIAGNOSIS — Z789 Other specified health status: Secondary | ICD-10-CM

## 2017-11-26 DIAGNOSIS — M899 Disorder of bone, unspecified: Secondary | ICD-10-CM | POA: Insufficient documentation

## 2017-11-26 NOTE — Patient Instructions (Signed)

## 2017-11-26 NOTE — Progress Notes (Signed)
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 Extremity Exam    Side: Right lower extremity  Side: Left lower extremity  Inspection: No masses, redness, swelling, or asymmetry. No contractures  Inspection: Evidence of previous arthroplasty.  Amputation great toe  Functional ROM: Unrestricted ROM          Functional ROM: Decreased ROM for knee joint and foot  Muscle strength & Tone: Functionally intact  Muscle strength & Tone: Guarding unable to walk on toes  Sensory: Unimpaired  Sensory: Unimpaired  Palpation: No palpable anomalies  Palpation: Tender   Assessment  Primary Diagnosis & Pertinent Problem List: Diagnoses of Chronic bilateral low back pain with bilateral sciatica (Primary Area of Pain) (L>R), Chronic pain of both lower extremities (Secondary Area of Pain) (L>R), Chronic pain of both knees (Tertiary Area of Pain) (L>R), Chronic pain syndrome, Long term current use of opiate analgesic, Pharmacologic therapy, Disorder of skeletal system, and Problems influencing health status were pertinent to this  visit.  Visit Diagnosis: 1. Chronic bilateral low back pain with bilateral sciatica (Primary Area of Pain) (L>R)   2.  Chronic pain of both lower extremities (Secondary Area of Pain) (L>R)   3. Chronic pain of both knees (Tertiary Area of Pain) (L>R)   4. Chronic pain syndrome   5. Long term current use of opiate analgesic   6. Pharmacologic therapy   7. Disorder of skeletal system   8. Problems influencing health status    Plan of Care  Initial treatment plan:  Please be advised that as per protocol, today's visit has been an evaluation only. We have not taken over the patient's controlled substance management.  Problem-specific plan: No problem-specific Assessment & Plan notes found for this encounter.  Ordered Lab-work, Procedure(s), Referral(s), & Consult(s): Orders Placed This Encounter  Procedures  . DG Cervical Spine With Flex & Extend  . Compliance Drug Analysis, Ur  . Comp. Metabolic Panel (12)  . Magnesium  . Vitamin B12  . Sedimentation rate  . 25-Hydroxyvitamin D Lcms D2+D3  . C-reactive protein   Pharmacotherapy: Medications ordered:  No orders of the defined types were placed in this encounter.  Medications administered during this visit: [name] had no medications administered during this visit.   Pharmacotherapy under consideration:  Opioid Analgesics: The patient was informed that there is no guarantee that he would be a candidate for opioid analgesics. The decision will be made following CDC guidelines. This decision will be based on the results of diagnostic studies, as well as Mr. Wender risk profile.  Membrane stabilizer: To be determined at a later time Muscle relaxant: To be determined at a later time NSAID: To be determined at a later time Other analgesic(s): To be determined at a later time   Interventional therapies under consideration: Mr. Kopke was informed that there is no guarantee that he would be a candidate for interventional  therapies. The decision will be based on the results of diagnostic studies, as well as Mr. Dansereau risk profile.  Possible procedure(s): Diagnostic bilateral lumbar epidural steroid injection Diagnostic bilateral lumbar facet nerve block Possible bilateral lumbar facet radiofrequency ablation Diagnostic right knee Hyalgan series Diagnostic right knee intra-articular knee injection Diagnostic left knee genicular nerve block   Provider-requested follow-up: Return for 2nd Visit, w/ Dr. Dossie Arbour, xrays before return for second visit.  Future Appointments  Date Time Provider [place]  12/12/2017 10:15 AM [facility] None  12/12/2017 10:45 AM Sindy Guadeloupe, MD [facility] None  12/12/2017 11:15 AM [facility] INJECTION [facility] None  12/24/2017  9:30 AM Milinda Pointer, MD [facility] None    Primary Care Physician: Pleas Koch, NP Location: [facility] Note by:  Date: 11/26/2017; Time: 2:18 PM  Pain Score Disclaimer: We use the NRS-11 scale. This is a self-reported, subjective measurement of pain severity with only modest accuracy. It is used primarily to identify changes within a particular patient. It must be understood that outpatient pain scales are significantly less accurate that those used for research, where they can be applied under ideal controlled circumstances with minimal exposure to variables. In reality, the score is likely to be a combination of pain intensity and pain affect, where pain affect describes the degree of emotional arousal or changes in action readiness caused by the sensory experience of pain. Factors such as social and work situation, setting, emotional state, anxiety levels, expectation, and prior pain experience may influence pain perception and show large inter-individual differences that may also be affected by time variables.  Patient instructions provided during this appointment: Patient Instructions    ____________________________________________________________________________________________  Appointment Policy Summary  It is our goal and responsibility to provide the medical community with assistance in the evaluation and management of patients with chronic pain. Unfortunately our resources are limited. Because we do not have an unlimited amount of time, or available appointments, we are required to closely monitor and manage their use. The following rules exist to maximize their use:  Patient's responsibilities: 1. Punctuality:  At what time should I arrive? You should be physically present in our office 30 minutes before your scheduled appointment. Your scheduled appointment is with your assigned healthcare provider. However, it takes 5-10 minutes to be "checked-in", and another 15 minutes for the nurses to do the admission. If you arrive to our office at the time you were given for your appointment, you will end up being at least 20-25 minutes late to your appointment with the provider. 2. Tardiness:  What happens if I arrive only a few minutes after my scheduled appointment time? You will need to reschedule your appointment. The cutoff is your appointment time. This is why it is so important that you arrive at least 30 minutes before that appointment. If you have an appointment scheduled for 10:00 AM and you arrive at 10:01, you will be required to reschedule your appointment.  3. Plan ahead:  Always assume that you will encounter traffic on your way in. Plan for it. If you are dependent on a driver, make sure they understand these rules and the need to arrive early. 4. Other appointments and responsibilities:  Avoid scheduling any other appointments before or after your pain clinic appointments.  5. Be prepared:  Write down everything that you need to discuss with your healthcare provider and give this information to the admitting nurse. Write down the medications that you will need  refilled. Bring your pills and bottles (even the empty ones), to all of your appointments, except for those where a procedure is scheduled. 6. No children or pets:  Find someone to take care of them. It is not appropriate to bring them in. 7. Scheduling changes:  We request "advanced notification" of any changes or cancellations. 8. Advanced notification:  Defined as a time period of more than 24 hours prior to the originally scheduled appointment. This allows for the appointment to be offered to other patients. 9. Rescheduling:  When a visit is rescheduled, it will require the cancellation of the original appointment. For this reason they both fall within the category of "Cancellations".  10. Cancellations:  They require advanced notification. Any cancellation less than 24 hours before the  appointment will be recorded as a "No Show". 11. No Show:  Defined as an unkept appointment where the patient failed to notify or declare to the practice their intention or inability to keep the appointment.  Corrective process for repeat offenders:  1. Tardiness: Three (3) episodes of rescheduling due to late arrivals will be recorded as one (1) "No Show". 2. Cancellation or reschedule: Three (3) cancellations or rescheduling will be recorded as one (1) "No Show". 3. "No Shows": Three (3) "No Shows" within a 12 month period will result in discharge from the practice. ____________________________________________________________________________________________  ____________________________________________________________________________________________  Pain Scale  Introduction: The pain score used by this practice is the Verbal Numerical Rating Scale (VNRS-11). This is an 11-point scale. It is for adults and children 10 years or older. There are significant differences in how the pain score is reported, used, and applied. Forget everything you learned in the past and learn this scoring  system.  General  Information: The scale should reflect your current level of pain. Unless you are specifically asked for the level of your worst pain, or your average pain. If you are asked for one of these two, then it should be understood that it is over the past 24 hours.  Basic Activities of Daily Living (ADL): Personal hygiene, dressing, eating, transferring, and using restroom.  Instructions: Most patients tend to report their level of pain as a combination of two factors, their physical pain and their psychosocial pain. This last one is also known as "suffering" and it is reflection of how physical pain affects you socially and psychologically. From now on, report them separately. From this point on, when asked to report your pain level, report only your physical pain. Use the following table for reference.  Pain Clinic Pain Levels (0-5/10)  Pain Level Score  Description  No Pain 0   Mild pain 1 Nagging, annoying, but does not interfere with basic activities of daily living (ADL). Patients are able to eat, bathe, get dressed, toileting (being able to get on and off the toilet and perform personal hygiene functions), transfer (move in and out of bed or a chair without assistance), and maintain continence (able to control bladder and bowel functions). Blood pressure and heart rate are unaffected. A normal heart rate for a healthy adult ranges from 60 to 100 bpm (beats per minute).   Mild to moderate pain 2 Noticeable and distracting. Impossible to hide from other people. More frequent flare-ups. Still possible to adapt and function close to normal. It can be very annoying and may have occasional stronger flare-ups. With discipline, patients may get used to it and adapt.   Moderate pain 3 Interferes significantly with activities of daily living (ADL). It becomes difficult to feed, bathe, get dressed, get on and off the toilet or to perform personal hygiene functions. Difficult to get in and out of bed or a chair  without assistance. Very distracting. With effort, it can be ignored when deeply involved in activities.   Moderately severe pain 4 Impossible to ignore for more than a few minutes. With effort, patients may still be able to manage work or participate in some social activities. Very difficult to concentrate. Signs of autonomic nervous system discharge are evident: dilated pupils (mydriasis); mild sweating (diaphoresis); sleep interference. Heart rate becomes elevated (>115 bpm). Diastolic blood pressure (lower number) rises above 100 mmHg. Patients find relief in laying down and not moving.   Severe pain 5 Intense and extremely unpleasant. Associated with frowning face and frequent crying. Pain overwhelms the senses.  Ability to do any activity or maintain social relationships becomes significantly limited. Conversation becomes difficult. Pacing back and forth is common, as getting into a comfortable position is nearly impossible. Pain wakes you up from deep sleep. Physical signs will be obvious: pupillary dilation; increased sweating; goosebumps; brisk reflexes; cold, clammy hands and feet; nausea, vomiting or dry heaves; loss of appetite; significant sleep disturbance with inability to fall asleep or to remain asleep. When persistent, significant weight loss is observed due to the complete loss of appetite and sleep deprivation.  Blood pressure and heart rate becomes significantly elevated. Caution: If elevated blood pressure triggers a pounding headache associated with blurred vision, then the patient should immediately seek attention at an urgent or emergency care unit, as these may be signs of an impending stroke.    Emergency Department Pain Levels (6-10/10)  Emergency Room Pain 6 Severely limiting. Requires  emergency care and should not be seen or managed at an outpatient pain management facility. Communication becomes difficult and requires great effort. Assistance to reach the emergency department  may be required. Facial flushing and profuse sweating along with potentially dangerous increases in heart rate and blood pressure will be evident.   Distressing pain 7 Self-care is very difficult. Assistance is required to transport, or use restroom. Assistance to reach the emergency department will be required. Tasks requiring coordination, such as bathing and getting dressed become very difficult.   Disabling pain 8 Self-care is no longer possible. At this level, pain is disabling. The individual is unable to do even the most "basic" activities such as walking, eating, bathing, dressing, transferring to a bed, or toileting. Fine motor skills are lost. It is difficult to think clearly.   Incapacitating pain 9 Pain becomes incapacitating. Thought processing is no longer possible. Difficult to remember your own name. Control of movement and coordination are lost.   The worst pain imaginable 10 At this level, most patients pass out from pain. When this level is reached, collapse of the autonomic nervous system occurs, leading to a sudden drop in blood pressure and heart rate. This in turn results in a temporary and dramatic drop in blood flow to the brain, leading to a loss of consciousness. Fainting is one of the body's self defense mechanisms. Passing out puts the brain in a calmed state and causes it to shut down for a while, in order to begin the healing process.    Summary: 1. Refer to this scale when providing [place] with your pain level. 2. Be accurate and careful when reporting your pain level. This will help with your care. 3. Over-reporting your pain level will lead to loss of credibility. 4. Even a level of 1/10 means that there is pain and will be treated at our facility. 5. High, inaccurate reporting will be documented as "Symptom Exaggeration", leading to loss of credibility and suspicions of possible secondary gains such as obtaining more narcotics, or wanting to appear disabled, for  fraudulent reasons. 6. Only pain levels of 5 or below will be seen at our facility. 7. Pain levels of 6 and above will be sent to the Emergency Department and the appointment cancelled. ____________________________________________________________________________________________

## 2017-11-30 LAB — COMP. METABOLIC PANEL (12)
A/G RATIO: 1.6 (ref 1.2–2.2)
AST: 24 IU/L (ref 0–40)
Albumin: 4.3 g/dL (ref 3.5–5.5)
Alkaline Phosphatase: 131 IU/L — ABNORMAL HIGH (ref 39–117)
BUN / CREAT RATIO: 7 — AB (ref 9–20)
BUN: 11 mg/dL (ref 6–24)
Bilirubin Total: 0.4 mg/dL (ref 0.0–1.2)
CALCIUM: 9.7 mg/dL (ref 8.7–10.2)
CHLORIDE: 100 mmol/L (ref 96–106)
Creatinine, Ser: 1.48 mg/dL — ABNORMAL HIGH (ref 0.76–1.27)
GFR, EST AFRICAN AMERICAN: 65 mL/min/{1.73_m2} (ref >59)
GFR, EST NON AFRICAN AMERICAN: 56 mL/min/{1.73_m2} — AB (ref >59)
GLOBULIN, TOTAL: 2.7 g/dL (ref 1.5–4.5)
Glucose: 59 mg/dL — ABNORMAL LOW (ref 65–99)
POTASSIUM: 4.5 mmol/L (ref 3.5–5.2)
Sodium: 140 mmol/L (ref 134–144)
TOTAL PROTEIN: 7 g/dL (ref 6.0–8.5)

## 2017-11-30 LAB — MAGNESIUM: Magnesium: 2.4 mg/dL — ABNORMAL HIGH (ref 1.6–2.3)

## 2017-11-30 LAB — SEDIMENTATION RATE: Sed Rate: 16 mm/hr — ABNORMAL HIGH (ref 0–15)

## 2017-11-30 LAB — 25-HYDROXYVITAMIN D LCMS D2+D3
25-HYDROXY, VITAMIN D-3: 37 ng/mL
25-HYDROXY, VITAMIN D: 41 ng/mL

## 2017-11-30 LAB — 25-HYDROXY VITAMIN D LCMS D2+D3: 25-Hydroxy, Vitamin D-2: 4.4 ng/mL

## 2017-11-30 LAB — C-REACTIVE PROTEIN: CRP: 9 mg/L (ref 0–10)

## 2017-11-30 LAB — VITAMIN B12: Vitamin B-12: 319 pg/mL (ref 232–1245)

## 2017-12-02 LAB — COMPLIANCE DRUG ANALYSIS, UR

## 2017-12-09 ENCOUNTER — Other Ambulatory Visit: Payer: Medicaid Other

## 2017-12-09 ENCOUNTER — Ambulatory Visit: Payer: Medicaid Other

## 2017-12-09 ENCOUNTER — Ambulatory Visit: Payer: Medicaid Other | Admitting: Oncology

## 2017-12-12 ENCOUNTER — Inpatient Hospital Stay (HOSPITAL_BASED_OUTPATIENT_CLINIC_OR_DEPARTMENT_OTHER): Payer: No Typology Code available for payment source | Admitting: Oncology

## 2017-12-12 ENCOUNTER — Telehealth: Payer: Self-pay | Admitting: *Deleted

## 2017-12-12 ENCOUNTER — Inpatient Hospital Stay: Payer: No Typology Code available for payment source | Attending: Oncology

## 2017-12-12 ENCOUNTER — Inpatient Hospital Stay: Payer: No Typology Code available for payment source

## 2017-12-12 ENCOUNTER — Encounter: Payer: Self-pay | Admitting: Oncology

## 2017-12-12 VITALS — BP 132/91 | HR 75 | Temp 97.7°F | Resp 18 | Ht 67.0 in | Wt 155.7 lb

## 2017-12-12 DIAGNOSIS — C9001 Multiple myeloma in remission: Secondary | ICD-10-CM

## 2017-12-12 DIAGNOSIS — T451X5A Adverse effect of antineoplastic and immunosuppressive drugs, initial encounter: Secondary | ICD-10-CM

## 2017-12-12 DIAGNOSIS — G620 Drug-induced polyneuropathy: Secondary | ICD-10-CM

## 2017-12-12 DIAGNOSIS — Z9484 Stem cells transplant status: Secondary | ICD-10-CM | POA: Diagnosis not present

## 2017-12-12 LAB — CBC WITH DIFFERENTIAL/PLATELET
BASOS PCT: 1 %
Basophils Absolute: 0 10*3/uL (ref 0–0.1)
EOS ABS: 0.1 10*3/uL (ref 0–0.7)
Eosinophils Relative: 3 %
HCT: 37.4 % — ABNORMAL LOW (ref 40.0–52.0)
Hemoglobin: 13.1 g/dL (ref 13.0–18.0)
Lymphocytes Relative: 16 %
Lymphs Abs: 0.6 10*3/uL — ABNORMAL LOW (ref 1.0–3.6)
MCH: 33 pg (ref 26.0–34.0)
MCHC: 34.9 g/dL (ref 32.0–36.0)
MCV: 94.5 fL (ref 80.0–100.0)
MONOS PCT: 7 %
Monocytes Absolute: 0.3 10*3/uL (ref 0.2–1.0)
Neutro Abs: 2.8 10*3/uL (ref 1.4–6.5)
Neutrophils Relative %: 73 %
Platelets: 180 10*3/uL (ref 150–440)
RBC: 3.96 MIL/uL — ABNORMAL LOW (ref 4.40–5.90)
RDW: 13.5 % (ref 11.5–14.5)
WBC: 3.8 10*3/uL (ref 3.8–10.6)

## 2017-12-12 LAB — COMPREHENSIVE METABOLIC PANEL
ALK PHOS: 83 U/L (ref 38–126)
ALT: 17 U/L (ref 0–44)
AST: 24 U/L (ref 15–41)
Albumin: 3.7 g/dL (ref 3.5–5.0)
Anion gap: 5 (ref 5–15)
BUN: 10 mg/dL (ref 6–20)
CALCIUM: 7.8 mg/dL — AB (ref 8.9–10.3)
CO2: 24 mmol/L (ref 22–32)
Chloride: 108 mmol/L (ref 98–111)
Creatinine, Ser: 1.61 mg/dL — ABNORMAL HIGH (ref 0.61–1.24)
GFR calc Af Amer: 58 mL/min — ABNORMAL LOW (ref >60)
GFR calc non Af Amer: 50 mL/min — ABNORMAL LOW (ref >60)
GLUCOSE: 110 mg/dL — AB (ref 70–99)
Potassium: 3.8 mmol/L (ref 3.5–5.1)
SODIUM: 137 mmol/L (ref 135–145)
Total Bilirubin: 0.6 mg/dL (ref 0.3–1.2)
Total Protein: 6.5 g/dL (ref 6.5–8.1)

## 2017-12-12 MED ORDER — DENOSUMAB 120 MG/1.7ML ~~LOC~~ SOLN
120.0000 mg | SUBCUTANEOUS | Status: DC
  Administered 2017-12-12: 120 mg via SUBCUTANEOUS
  Filled 2017-12-12: qty 1.7

## 2017-12-12 NOTE — Progress Notes (Signed)
No new changes noted today 

## 2017-12-12 NOTE — Telephone Encounter (Signed)
Pt here to get xgeva and the corrected calcium 8.02 and I spoke to Dr. Janese Banks and she is fine with giving xgeva dose today, James House spoke to [employer] and she wanted to make sure that it is ok with Janese Banks and I called her again and she wants to proceed with giving xgeva today. We will give the treatment today

## 2017-12-15 LAB — MULTIPLE MYELOMA PANEL, SERUM
ALBUMIN/GLOB SERPL: 1.5 (ref 0.7–1.7)
ALPHA2 GLOB SERPL ELPH-MCNC: 0.8 g/dL (ref 0.4–1.0)
Albumin SerPl Elph-Mcnc: 3.5 g/dL (ref 2.9–4.4)
Alpha 1: 0.1 g/dL (ref 0.0–0.4)
B-GLOBULIN SERPL ELPH-MCNC: 0.9 g/dL (ref 0.7–1.3)
Gamma Glob SerPl Elph-Mcnc: 0.7 g/dL (ref 0.4–1.8)
Globulin, Total: 2.5 g/dL (ref 2.2–3.9)
IGG (IMMUNOGLOBIN G), SERUM: 767 mg/dL (ref 700–1600)
IgA: 149 mg/dL (ref 90–386)
IgM (Immunoglobulin M), Srm: 41 mg/dL (ref 20–172)
TOTAL PROTEIN ELP: 6 g/dL (ref 6.0–8.5)

## 2017-12-15 LAB — KAPPA/LAMBDA LIGHT CHAINS
KAPPA FREE LGHT CHN: 14.5 mg/L (ref 3.3–19.4)
Kappa, lambda light chain ratio: 1.42 (ref 0.26–1.65)
LAMDA FREE LIGHT CHAINS: 10.2 mg/L (ref 5.7–26.3)
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
## 2017-12-16 ENCOUNTER — Ambulatory Visit
Admission: RE | Admit: 2017-12-16 | Discharge: 2017-12-16 | Disposition: A | Discharge status: Home / Self Care | Payer: No Typology Code available for payment source | Source: Ambulatory Visit | Attending: Nurse Practitioner | Admitting: Nurse Practitioner

## 2017-12-16 DIAGNOSIS — M25561 Pain in right knee: Secondary | ICD-10-CM

## 2017-12-16 DIAGNOSIS — M5441 Lumbago with sciatica, right side: Principal | ICD-10-CM

## 2017-12-16 DIAGNOSIS — M5442 Lumbago with sciatica, left side: Secondary | ICD-10-CM | POA: Diagnosis present

## 2017-12-16 DIAGNOSIS — G8929 Other chronic pain: Secondary | ICD-10-CM | POA: Insufficient documentation

## 2017-12-16 DIAGNOSIS — M1711 Unilateral primary osteoarthritis, right knee: Secondary | ICD-10-CM | POA: Diagnosis not present

## 2017-12-16 DIAGNOSIS — M25562 Pain in left knee: Secondary | ICD-10-CM

## 2017-12-16 NOTE — Progress Notes (Signed)
Results were reviewed and found to be: mildly abnormal  No acute injury or pathology identified  Review would suggest interventional pain management techniques may be of benefit 

## 2017-12-22 ENCOUNTER — Emergency Department: Payer: No Typology Code available for payment source

## 2017-12-22 ENCOUNTER — Encounter: Payer: Self-pay | Admitting: Emergency Medicine

## 2017-12-22 ENCOUNTER — Emergency Department
Admission: EM | Admit: 2017-12-22 | Discharge: 2017-12-22 | Disposition: A | Discharge status: Home / Self Care | Payer: No Typology Code available for payment source | Attending: Student in an Organized Health Care Education/Training Program | Admitting: Student in an Organized Health Care Education/Training Program

## 2017-12-22 ENCOUNTER — Other Ambulatory Visit: Payer: Self-pay

## 2017-12-22 DIAGNOSIS — R1012 Left upper quadrant pain: Secondary | ICD-10-CM | POA: Diagnosis not present

## 2017-12-22 DIAGNOSIS — R112 Nausea with vomiting, unspecified: Secondary | ICD-10-CM | POA: Diagnosis present

## 2017-12-22 DIAGNOSIS — N183 Chronic kidney disease, stage 3 (moderate): Secondary | ICD-10-CM | POA: Diagnosis not present

## 2017-12-22 DIAGNOSIS — Z7982 Long term (current) use of aspirin: Secondary | ICD-10-CM | POA: Insufficient documentation

## 2017-12-22 DIAGNOSIS — Z87891 Personal history of nicotine dependence: Secondary | ICD-10-CM | POA: Insufficient documentation

## 2017-12-22 DIAGNOSIS — I129 Hypertensive chronic kidney disease with stage 1 through stage 4 chronic kidney disease, or unspecified chronic kidney disease: Secondary | ICD-10-CM | POA: Insufficient documentation

## 2017-12-22 DIAGNOSIS — Z79899 Other long term (current) drug therapy: Secondary | ICD-10-CM | POA: Insufficient documentation

## 2017-12-22 LAB — CBC
HEMATOCRIT: 41 % (ref 40.0–52.0)
Hemoglobin: 14.6 g/dL (ref 13.0–18.0)
MCH: 33.5 pg (ref 26.0–34.0)
MCHC: 35.7 g/dL (ref 32.0–36.0)
MCV: 93.9 fL (ref 80.0–100.0)
PLATELETS: 243 10*3/uL (ref 150–440)
RBC: 4.37 MIL/uL — ABNORMAL LOW (ref 4.40–5.90)
RDW: 13.4 % (ref 11.5–14.5)
WBC: 6.9 10*3/uL (ref 3.8–10.6)

## 2017-12-22 LAB — COMPREHENSIVE METABOLIC PANEL
ALT: 14 U/L (ref 0–44)
AST: 22 U/L (ref 15–41)
Albumin: 4.2 g/dL (ref 3.5–5.0)
Alkaline Phosphatase: 78 U/L (ref 38–126)
Anion gap: 5 (ref 5–15)
BILIRUBIN TOTAL: 0.5 mg/dL (ref 0.3–1.2)
BUN: 12 mg/dL (ref 6–20)
CO2: 25 mmol/L (ref 22–32)
Calcium: 8.2 mg/dL — ABNORMAL LOW (ref 8.9–10.3)
Chloride: 108 mmol/L (ref 98–111)
Creatinine, Ser: 1.5 mg/dL — ABNORMAL HIGH (ref 0.61–1.24)
GFR calc Af Amer: >60 mL/min (ref >60)
GFR, EST NON AFRICAN AMERICAN: 54 mL/min — AB (ref >60)
Glucose, Bld: 103 mg/dL — ABNORMAL HIGH (ref 70–99)
POTASSIUM: 4 mmol/L (ref 3.5–5.1)
Sodium: 138 mmol/L (ref 135–145)
TOTAL PROTEIN: 7 g/dL (ref 6.5–8.1)

## 2017-12-22 LAB — LIPASE, BLOOD: Lipase: 52 U/L — ABNORMAL HIGH (ref 11–51)

## 2017-12-22 LAB — URINALYSIS, COMPLETE (UACMP) WITH MICROSCOPIC
BACTERIA UA: NONE SEEN
BILIRUBIN URINE: NEGATIVE
Glucose, UA: NEGATIVE mg/dL
HGB URINE DIPSTICK: NEGATIVE
KETONES UR: NEGATIVE mg/dL
LEUKOCYTES UA: NEGATIVE
NITRITE: NEGATIVE
PH: 8 (ref 5.0–8.0)
Protein, ur: NEGATIVE mg/dL
Specific Gravity, Urine: 1.014 (ref 1.005–1.030)

## 2017-12-22 MED ORDER — IOPAMIDOL (ISOVUE-300) INJECTION 61%
100.0000 mL | Freq: Once | INTRAVENOUS | Status: AC | PRN
  Administered 2017-12-22: 100 mL via INTRAVENOUS

## 2017-12-22 MED ORDER — PROMETHAZINE HCL 25 MG/ML IJ SOLN
12.5000 mg | Freq: Four times a day (QID) | INTRAMUSCULAR | Status: DC | PRN
Start: 2017-12-22 — End: 2017-12-23
  Administered 2017-12-22: 12.5 mg via INTRAVENOUS
  Filled 2017-12-22: qty 1

## 2017-12-22 MED ORDER — MORPHINE SULFATE (PF) 4 MG/ML IV SOLN
4.0000 mg | INTRAVENOUS | Status: DC | PRN
  Administered 2017-12-22: 4 mg via INTRAVENOUS
  Filled 2017-12-22: qty 1

## 2017-12-22 MED ORDER — PROMETHAZINE HCL 12.5 MG PO TABS: 12.5000 mg | ORAL_TABLET | Freq: Four times a day (QID) | ORAL | 0 refills | Status: DC | PRN

## 2017-12-22 MED ORDER — HYDROCODONE-ACETAMINOPHEN 5-325 MG PO TABS: 1.0000 | ORAL_TABLET | ORAL | 0 refills | Status: DC | PRN

## 2017-12-22 MED ORDER — SODIUM CHLORIDE 0.9 % IV BOLUS
1000.0000 mL | Freq: Once | INTRAVENOUS | Status: AC
  Administered 2017-12-22: 1000 mL via INTRAVENOUS

## 2017-12-22 NOTE — ED Provider Notes (Signed)
[facility] Emergency Department Provider Note    First MD Initiated Contact with Patient 12/22/17 2113     (approximate)  I have reviewed the triage vital signs and the nursing notes.   HISTORY  Chief Complaint Emesis    HPI James House. is a 47 y.o. male history of CKD stage III as well as multiple myeloma and chronic back pain related to that and family history of pancreatic cancer presents the ER with 5 days of nausea vomiting epigastric pain radiating through to his back.  This is also had some diarrhea.  Is been unable to keep any fluids down.  States the diarrhea is watery.  No blood in stools.  No recent antibiotics.  Given the ER today because he was having chills and was worried he is getting more dehydrated.    Past Medical History:  Diagnosis Date  . CKD (chronic kidney disease) stage 3, GFR 30-59 ml/min (HCC)   . Hypertension   . Multiple myeloma ([place]) 08/11/2017  . Neuropathy   . Pneumonia    Family History  Problem Relation Age of Onset  . Cancer Mother 64       Pancreatic  . COPD Mother   . Diabetes Mother   . Hyperlipidemia Mother   . Hypertension Mother   . Cancer Maternal Uncle        pancreatic  . Cancer Maternal Grandmother 87       colon  . COPD Maternal Grandmother   . Diabetes Maternal Grandmother   . Hypertension Maternal Grandmother    Past Surgical History:  Procedure Laterality Date  . ABDOMINAL SURGERY    . JOINT REPLACEMENT     right knee  . KNEE SURGERY Left    Patient Active Problem List   Diagnosis Date Noted  . Chronic bilateral low back pain with bilateral sciatica (Primary Area of Pain) (L>R) 11/26/2017  . Chronic pain of both lower extremities (Secondary Area of Pain) (L>R) 11/26/2017  . Chronic pain of both knees [facility] Area of Pain) (L>R) 11/26/2017  . Chronic pain syndrome 11/26/2017  . Long term current use of opiate analgesic 11/26/2017  . Pharmacologic therapy 11/26/2017  . Disorder  of skeletal system 11/26/2017  . Problems influencing health status 11/26/2017  . Neuropathy 10/31/2017  . Vitamin B 12 deficiency 10/31/2017  . Syncope 08/23/2017  . CKD (chronic kidney disease), stage III ([place]) 08/23/2017  . HTN (hypertension) 08/23/2017  . Severe sepsis ([place]) 08/11/2017  . Hyponatremia 08/11/2017  . Multiple myeloma ([place]) 08/11/2017      Prior to Admission medications   Medication Sig Start Date End Date Taking? Authorizing Provider  acetaminophen (TYLENOL) 325 MG tablet Take 2 tablets (650 mg total) by mouth every 6 (six) hours as needed for mild pain (or Fever >/= 101). Patient not taking: Reported on 12/12/2017 08/26/17   Nicholes Mango, MD  acyclovir (ZOVIRAX) 200 MG capsule Take 200 mg by mouth 2 (two) times daily.     [provider]  aspirin EC 81 MG tablet Take 81 mg by mouth daily.    [provider]  gabapentin (NEURONTIN) 600 MG tablet Take 600 mg by mouth 3 (three) times daily.    [provider]  HYDROcodone-acetaminophen (NORCO) 5-325 MG tablet Take 1 tablet by mouth every 4 (four) hours as needed for moderate pain. 12/22/17   Merlyn Lot, MD  lenalidomide (REVLIMID) 5 MG capsule Take 1 capsule (5 mg total) by mouth daily. 10/27/17  Sindy Guadeloupe, MD  oxyCODONE (OXY IR/ROXICODONE) 5 MG immediate release tablet Take 1 tablet (5 mg total) by mouth every 6 (six) hours as needed for severe pain. Patient not taking: Reported on 12/12/2017 11/11/17   Sindy Guadeloupe, MD  pantoprazole (PROTONIX) 40 MG tablet Take 40 mg by mouth daily.    [provider]  pregabalin (LYRICA) 150 MG capsule Take 150 mg by mouth 2 (two) times daily.    [provider]  promethazine (PHENERGAN) 12.5 MG tablet Take 1 tablet (12.5 mg total) by mouth every 6 (six) hours as needed for nausea or vomiting. 12/22/17   Merlyn Lot, MD  sildenafil (REVATIO) 20 MG tablet 2-5 tabs 1 hour prior to intercourse Patient not taking: Reported on  12/12/2017 10/07/17   Abbie Sons, MD  tiZANidine (ZANAFLEX) 4 MG tablet Take 4 mg by mouth every 8 (eight) hours as needed for muscle spasms.    [provider]  Vitamin D, Ergocalciferol, (DRISDOL) 50000 units CAPS capsule Take 50,000 Units by mouth every 7 (seven) days. Takes on Monday    [provider]  zolpidem (AMBIEN) 5 MG tablet Take 1 tablet (5 mg total) by mouth at bedtime. 11/11/17   Sindy Guadeloupe, MD    Allergies Patient has no known allergies.    Social History Social History   Tobacco Use  . Smoking status: Former Smoker    Last attempt to quit: 08/19/2013    Years since quitting: 4.3  . Smokeless tobacco: Current User    Types: Snuff  Substance Use Topics  . Alcohol use: No  . Drug use: No    Review of Systems Patient denies headaches, rhinorrhea, blurry vision, numbness, shortness of breath, chest pain, edema, cough, abdominal pain, nausea, vomiting, diarrhea, dysuria, fevers, rashes or hallucinations unless otherwise stated above in HPI. ____________________________________________   PHYSICAL EXAM:  VITAL SIGNS: Vitals:   12/22/17 2200 12/22/17 2230  BP: 122/85 128/85  Pulse: 65 70  Resp:  18  Temp:    SpO2: 100% 100%    Constitutional: Alert and oriented.  Eyes: Conjunctivae are normal.  Head: Atraumatic. Nose: No congestion/rhinnorhea. Mouth/Throat: Mucous membranes are moist.   Neck: No stridor. Painless ROM.  Cardiovascular: Normal rate, regular rhythm. Grossly normal heart sounds.  Good peripheral circulation. Respiratory: Normal respiratory effort.  No retractions. Lungs CTAB. Gastrointestinal: Soft and nontender. No distention. No abdominal bruits. No CVA tenderness. Genitourinary: deferred Musculoskeletal: No lower extremity tenderness nor edema.  No joint effusions. Neurologic:  Normal speech and language. No gross focal neurologic deficits are appreciated. No facial droop Skin:  Skin is warm, dry and intact. No rash  noted. Psychiatric: Mood and affect are normal. Speech and behavior are normal.  ____________________________________________   LABS (all labs ordered are listed, but only abnormal results are displayed)  Results for orders placed or performed during the hospital encounter of 12/22/17 (from the past 24 hour(s))  Lipase, blood     Status: Abnormal   Collection Time: 12/22/17  8:41 PM  Result Value Ref Range   Lipase 52 (H) 11 - 51 U/L  Comprehensive metabolic panel     Status: Abnormal   Collection Time: 12/22/17  8:41 PM  Result Value Ref Range   Sodium 138 135 - 145 mmol/L   Potassium 4.0 3.5 - 5.1 mmol/L   Chloride 108 98 - 111 mmol/L   CO2 25 22 - 32 mmol/L   Glucose, Bld 103 (H) 70 - 99 mg/dL  BUN 12 6 - 20 mg/dL   Creatinine, Ser 1.50 (H) 0.61 - 1.24 mg/dL   Calcium 8.2 (L) 8.9 - 10.3 mg/dL   Total Protein 7.0 6.5 - 8.1 g/dL   Albumin 4.2 3.5 - 5.0 g/dL   AST 22 15 - 41 U/L   ALT 14 0 - 44 U/L   Alkaline Phosphatase 78 38 - 126 U/L   Total Bilirubin 0.5 0.3 - 1.2 mg/dL   GFR calc non Af Amer 54 (L) >60 mL/min   GFR calc Af Amer >60 >60 mL/min   Anion gap 5 5 - 15  CBC     Status: Abnormal   Collection Time: 12/22/17  8:41 PM  Result Value Ref Range   WBC 6.9 3.8 - 10.6 K/uL   RBC 4.37 (L) 4.40 - 5.90 MIL/uL   Hemoglobin 14.6 13.0 - 18.0 g/dL   HCT 41.0 40.0 - 52.0 %   MCV 93.9 80.0 - 100.0 fL   MCH 33.5 26.0 - 34.0 pg   MCHC 35.7 32.0 - 36.0 g/dL   RDW 13.4 11.5 - 14.5 %   Platelets 243 150 - 440 K/uL  Urinalysis, Complete w Microscopic     Status: Abnormal   Collection Time: 12/22/17  8:41 PM  Result Value Ref Range   Color, Urine YELLOW (A) YELLOW   APPearance CLEAR (A) CLEAR   Specific Gravity, Urine 1.014 1.005 - 1.030   pH 8.0 5.0 - 8.0   Glucose, UA NEGATIVE NEGATIVE mg/dL   Hgb urine dipstick NEGATIVE NEGATIVE   Bilirubin Urine NEGATIVE NEGATIVE   Ketones, ur NEGATIVE NEGATIVE mg/dL   Protein, ur NEGATIVE NEGATIVE mg/dL   Nitrite NEGATIVE NEGATIVE    Leukocytes, UA NEGATIVE NEGATIVE   RBC / HPF 0-5 0 - 5 RBC/hpf   WBC, UA 0-5 0 - 5 WBC/hpf   Bacteria, UA NONE SEEN NONE SEEN   Squamous Epithelial / LPF 0-5 0 - 5   ____________________________________________ ____________________________________________  RADIOLOGY  I personally reviewed all radiographic images ordered to evaluate for the above acute complaints and reviewed radiology reports and findings.  These findings were personally discussed with the patient.  Please see medical record for radiology report.  ____________________________________________   PROCEDURES  Procedure(s) performed:  Procedures    Critical Care performed: no ____________________________________________   INITIAL IMPRESSION / ASSESSMENT AND PLAN / ED COURSE  Pertinent labs & imaging results that were available during my care of the patient were reviewed by me and considered in my medical decision making (see chart for details).   DDX: enteritis, colitis, diverticulitis, pancreatitis, cholecystitis, cholelithiasis, withdrawal  Marsalis Beaulieu. is a 47 y.o. who presents to the ED with symptoms as described above.  Patient is AFVSS in ED. Exam as above. Given current presentation have considered the above differential.  Based on patient's pain description of events mildly elevated lipase and significant history will order IV fluids as well as IV pain medication and CT imaging to evaluate for the above differential.   Clinical Course as of Dec 22 2321  Mon Dec 22, 2017  2251 CT imaging is reassuring.  Possible epiploic appendagitis.  Less likely colitis.   [PR]  2251 No evidence of pancreatitis.   [PR]  2322 Repeat abdominal exam soft benign.  Patient is tolerating hydration.  At this point do believe she stable and appropriate for outpatient follow-up.  Have discussed with the patient and available family all diagnostics and treatments performed thus far and all questions  were answered to the  best of my ability. The patient demonstrates understanding and agreement with plan.    [PR]    Clinical Course User Index [PR] Merlyn Lot, MD     As part of my medical decision making, I reviewed the following data within the [place] notes reviewed and incorporated, Labs reviewed, notes from prior ED visits and [place] Controlled Substance Database   ____________________________________________   FINAL CLINICAL IMPRESSION(S) / ED DIAGNOSES  Final diagnoses:  Left upper quadrant pain      NEW MEDICATIONS STARTED DURING THIS VISIT:  New Prescriptions   HYDROCODONE-ACETAMINOPHEN (NORCO) 5-325 MG TABLET    Take 1 tablet by mouth every 4 (four) hours as needed for moderate pain.   PROMETHAZINE (PHENERGAN) 12.5 MG TABLET    Take 1 tablet (12.5 mg total) by mouth every 6 (six) hours as needed for nausea or vomiting.     Note:  This document was prepared using Dragon voice recognition software and may include unintentional dictation errors.    Merlyn Lot, MD 12/22/17 [phone]

## 2017-12-22 NOTE — ED Notes (Signed)
Pt went to CT

## 2017-12-22 NOTE — ED Triage Notes (Signed)
Patient ambulatory to triage with steady gait, without difficulty or distress noted, mask in place; pt reports chills with N/V/D since Friday

## 2017-12-22 NOTE — Discharge Instructions (Signed)

## 2017-12-23 NOTE — Progress Notes (Signed)
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 (Neurological): Unimpaired          Sensory (Neurological): Unimpaired          Palpation: No palpable anomalies              Palpation: No palpable anomalies              Provocative Test(s):  Phalen's test: deferred Tinel's test: deferred Apley's scratch test (touch opposite shoulder):  Action 1 (Across chest): deferred Action 2 (Overhead): deferred Action 3 (LB reach): deferred   Provocative Test(s):  Phalen's test: deferred Tinel's test: deferred Apley's scratch test (touch opposite shoulder):  Action 1 (Across chest): deferred Action 2 (Overhead): deferred Action 3 (LB reach): deferred    Thoracic Spine Area Exam  Skin & Axial Inspection: No masses, redness, or swelling Alignment: Symmetrical Functional ROM: Unrestricted  ROM Stability: No instability detected Muscle Tone/Strength: Functionally intact. No obvious neuro-muscular anomalies detected. Sensory (Neurological): Unimpaired Muscle strength & Tone: No palpable anomalies  Lumbar Spine Area Exam  Skin & Axial Inspection: No masses, redness, or swelling Alignment: Symmetrical Functional ROM: Decreased ROM       Stability: No instability detected Muscle Tone/Strength: Functionally intact. No obvious neuro-muscular anomalies detected. Sensory (Neurological): Movement-associated pain Palpation: Complains of area being tender to palpation       Provocative Tests: Hyperextension/rotation test: (+) bilaterally for facet joint pain. Lumbar quadrant test (Kemp's test): (+) bilaterally for facet joint pain. Lateral bending test: deferred today       Patrick's Maneuver: (+) for bilateral S-I arthralgia             FABER test: (+) for bilateral S-I arthralgia  S-I anterior distraction/compression test: deferred today         S-I lateral compression test: deferred today         S-I Thigh-thrust test: deferred today         S-I Gaenslen's test: deferred today          Gait & Posture Assessment  Ambulation: Unassisted Gait: Relatively normal for age and body habitus Posture: WNL   Lower Extremity Exam    Side: Right lower extremity  Side: Left lower extremity  Stability: No instability observed          Stability: No instability observed          Skin & Extremity Inspection: Skin color, temperature, and hair growth are WNL. No peripheral edema or cyanosis. No masses, redness, swelling, asymmetry, or associated skin lesions. No contractures.  Skin & Extremity Inspection: Skin color, temperature, and hair growth are WNL. No peripheral edema or cyanosis. No masses, redness, swelling, asymmetry, or associated skin lesions. No contractures.  Functional ROM: Unrestricted ROM                  Functional ROM: Diminished ROM for knee joint          Muscle  Tone/Strength: Functionally intact. No obvious neuro-muscular anomalies detected.  Muscle Tone/Strength: Functionally intact. No obvious neuro-muscular anomalies detected.  Sensory (Neurological): Articular pain pattern  Sensory (Neurological): Articular pain pattern  Palpation: No palpable anomalies  Palpation: No palpable anomalies   Assessment & Plan  Primary Diagnosis & Pertinent Problem List: The primary encounter diagnosis was Chronic pain syndrome. Diagnoses of Multiple myeloma in remission ([place]), Chemotherapy-induced peripheral neuropathy (HCC), Chronic low back pain (Primary Area of Pain) (Bilateral) (L>R) w/ sciatica (Bilateral), Other intervertebral disc degeneration, lumbar region, Spondylosis without myelopathy or radiculopathy, lumbar region, Lumbar facet syndrome, Other specified dorsopathies, sacral and sacrococcygeal region, Chronic sacroiliac joint pain (B) (R>L), Chronic lower extremity pain (Secondary Area of Pain) (Bilateral) (L>R), Chronic pain of both hips (B) (R>L), Chronic knee pain (Tertiary Area of Pain) (Bilateral) (L>R), Chronic knee pain s/p partial replacement (Left), History of Partial knee replacement (Left), Osteoarthritis of knee (Right), Neurogenic pain, Disorder of skeletal system, Problems influencing health status, Pharmacologic therapy, and Long term current use of opiate analgesic were also pertinent to this visit.  Visit Diagnosis: 1. Chronic pain syndrome   2. Multiple myeloma in remission ([place])   3. Chemotherapy-induced peripheral neuropathy ([place])   4. Chronic low back pain (Primary Area of Pain) (Bilateral) (L>R) w/ sciatica (Bilateral)   5. Other intervertebral disc degeneration, lumbar region   6. Spondylosis without myelopathy or radiculopathy, lumbar region   7. Lumbar facet syndrome   8. Other specified dorsopathies, sacral and sacrococcygeal region   9. Chronic sacroiliac joint pain (B) (R>L)   10. Chronic lower extremity pain (Secondary Area of  Pain) (Bilateral) (L>R)   11. Chronic pain of both hips (B) (R>L)   12. Chronic knee pain (Tertiary Area of Pain) (Bilateral) (L>R)   13. Chronic knee pain s/p partial replacement (Left)   14. History of Partial knee replacement (Left)   15. Osteoarthritis of knee (Right)   16. Neurogenic pain   17. Disorder of skeletal system   18. Problems influencing health status   19. Pharmacologic therapy   20. Long term current use of opiate analgesic    Problems updated and reviewed during this visit: No problems updated. Plan of Care  Pharmacotherapy (Medications Ordered): Meds ordered this encounter  Medications  .  meloxicam (MOBIC) 15 MG tablet    Sig: Take 1 tablet (15 mg total) by mouth daily.    Dispense:  30 tablet    Refill:  0    Do not add this medication to the electronic "Automatic Refill" notification system. Patient may have prescription filled one day early if pharmacy is closed on scheduled refill date.  Marland Kitchen oxyCODONE (OXY IR/ROXICODONE) 5 MG immediate release tablet    Sig: Take 0.5 tablets (2.5 mg total) by mouth 2 (two) times daily.    Dispense:  30 tablet    Refill:  0    Do not place this medication, or any other prescription from our practice, on "Automatic Refill". Patient may have prescription filled one day early if pharmacy is closed on scheduled refill date. Do not fill until: 12/24/17 To last until: 01/23/18    Procedure Orders     LUMBAR FACET(MEDIAL BRANCH NERVE BLOCK) MBNB     SACROILIAC JOINT INJECTION Lab Orders  No laboratory test(s) ordered today    Imaging Orders     MR LUMBAR SPINE W WO CONTRAST     DG HIP UNILAT W OR W/O PELVIS 2-3 VIEWS RIGHT     DG HIP UNILAT W OR W/O PELVIS 2-3 VIEWS LEFT     DG Si Joints Referral Orders  No referral(s) requested today    Pharmacological management options:  Opioid Analgesics: We'll take over management today. See above orders Membrane stabilizer: We have discussed the possibility of optimizing this mode  of therapy, if tolerated Muscle relaxant: We have discussed the possibility of a trial NSAID: We have discussed the possibility of a trial Other analgesic(s): To be determined at a later time   Interventional management options: Planned, scheduled, and/or pending:    Diagnostic bilateral lumbar facet block #1 + SI BLK #1 under fluoroscopic guidance and IV sedation    Considering:   Diagnostic bilateral LESI  Diagnostic bilateral lumbar facet block  Possible bilateral lumbar facet RFA  Diagnostic right knee Hyalgan series  Diagnostic right knee intra-articular knee injection  Diagnostic left knee genicular nerve block    PRN Procedures:   None at this time   Provider-requested follow-up: Return for Procedure (w/ sedation): (B) L-FCT BLK #1 + (B) SI BLK #1.  Future Appointments  Date Time Provider [place]  01/02/2018  1:00 PM [facility] [place]  01/12/2018  1:15 PM [facility] None  01/12/2018  1:30 PM [facility] INJECTION [facility] None  01/16/2018 10:45 AM Stoioff, Ronda Fairly, MD [facility] None  02/12/2018 10:30 AM [facility] None  02/12/2018 10:45 AM Sindy Guadel[place], MD [facility] None  02/12/2018 11:00 AM [facility] IN[facility] None    Primary Care Physician: Pleas Koch, NP Location: [facility] Note by: Gaspar Cola, MD Date: 12/24/2017; Time: 10:30 AM

## 2017-12-24 ENCOUNTER — Ambulatory Visit: Payer: No Typology Code available for payment source | Attending: Pain Medicine | Admitting: Pain Medicine

## 2017-12-24 ENCOUNTER — Telehealth: Payer: Self-pay | Admitting: *Deleted

## 2017-12-24 ENCOUNTER — Other Ambulatory Visit: Payer: Self-pay | Admitting: *Deleted

## 2017-12-24 ENCOUNTER — Encounter: Payer: Self-pay | Admitting: Pain Medicine

## 2017-12-24 ENCOUNTER — Other Ambulatory Visit: Payer: Self-pay

## 2017-12-24 VITALS — BP 137/87 | HR 75 | Temp 98.2°F | Resp 18 | Ht 67.0 in | Wt 150.0 lb

## 2017-12-24 DIAGNOSIS — Z79891 Long term (current) use of opiate analgesic: Secondary | ICD-10-CM | POA: Insufficient documentation

## 2017-12-24 DIAGNOSIS — Z9221 Personal history of antineoplastic chemotherapy: Secondary | ICD-10-CM | POA: Insufficient documentation

## 2017-12-24 DIAGNOSIS — C9001 Multiple myeloma in remission: Secondary | ICD-10-CM

## 2017-12-24 DIAGNOSIS — G620 Drug-induced polyneuropathy: Secondary | ICD-10-CM

## 2017-12-24 DIAGNOSIS — M792 Neuralgia and neuritis, unspecified: Secondary | ICD-10-CM

## 2017-12-24 DIAGNOSIS — M533 Sacrococcygeal disorders, not elsewhere classified: Secondary | ICD-10-CM

## 2017-12-24 DIAGNOSIS — I129 Hypertensive chronic kidney disease with stage 1 through stage 4 chronic kidney disease, or unspecified chronic kidney disease: Secondary | ICD-10-CM | POA: Diagnosis not present

## 2017-12-24 DIAGNOSIS — M5136 Other intervertebral disc degeneration, lumbar region: Secondary | ICD-10-CM | POA: Diagnosis not present

## 2017-12-24 DIAGNOSIS — Z789 Other specified health status: Secondary | ICD-10-CM

## 2017-12-24 DIAGNOSIS — Z79899 Other long term (current) drug therapy: Secondary | ICD-10-CM

## 2017-12-24 DIAGNOSIS — M79604 Pain in right leg: Secondary | ICD-10-CM

## 2017-12-24 DIAGNOSIS — M545 Low back pain: Secondary | ICD-10-CM | POA: Insufficient documentation

## 2017-12-24 DIAGNOSIS — M5388 Other specified dorsopathies, sacral and sacrococcygeal region: Secondary | ICD-10-CM

## 2017-12-24 DIAGNOSIS — M5442 Lumbago with sciatica, left side: Secondary | ICD-10-CM | POA: Diagnosis not present

## 2017-12-24 DIAGNOSIS — Z96652 Presence of left artificial knee joint: Secondary | ICD-10-CM

## 2017-12-24 DIAGNOSIS — G894 Chronic pain syndrome: Secondary | ICD-10-CM | POA: Diagnosis not present

## 2017-12-24 DIAGNOSIS — M25562 Pain in left knee: Secondary | ICD-10-CM

## 2017-12-24 DIAGNOSIS — M899 Disorder of bone, unspecified: Secondary | ICD-10-CM

## 2017-12-24 DIAGNOSIS — M79605 Pain in left leg: Secondary | ICD-10-CM

## 2017-12-24 DIAGNOSIS — M25561 Pain in right knee: Secondary | ICD-10-CM

## 2017-12-24 DIAGNOSIS — T451X5A Adverse effect of antineoplastic and immunosuppressive drugs, initial encounter: Secondary | ICD-10-CM

## 2017-12-24 DIAGNOSIS — M47816 Spondylosis without myelopathy or radiculopathy, lumbar region: Secondary | ICD-10-CM | POA: Insufficient documentation

## 2017-12-24 DIAGNOSIS — M5441 Lumbago with sciatica, right side: Secondary | ICD-10-CM

## 2017-12-24 DIAGNOSIS — M1711 Unilateral primary osteoarthritis, right knee: Secondary | ICD-10-CM | POA: Insufficient documentation

## 2017-12-24 DIAGNOSIS — M25551 Pain in right hip: Secondary | ICD-10-CM

## 2017-12-24 DIAGNOSIS — N183 Chronic kidney disease, stage 3 (moderate): Secondary | ICD-10-CM | POA: Diagnosis not present

## 2017-12-24 DIAGNOSIS — M25552 Pain in left hip: Secondary | ICD-10-CM

## 2017-12-24 DIAGNOSIS — G8929 Other chronic pain: Secondary | ICD-10-CM | POA: Insufficient documentation

## 2017-12-24 DIAGNOSIS — R700 Elevated erythrocyte sedimentation rate: Secondary | ICD-10-CM

## 2017-12-24 HISTORY — DX: Elevated erythrocyte sedimentation rate: R70.0

## 2017-12-24 MED ORDER — LENALIDOMIDE 5 MG PO CAPS: 5.0000 mg | ORAL_CAPSULE | Freq: Every day | ORAL | 0 refills | Status: DC

## 2017-12-24 MED ORDER — MELOXICAM 15 MG PO TABS: 15.0000 mg | ORAL_TABLET | Freq: Every day | ORAL | 0 refills | Status: AC

## 2017-12-24 MED ORDER — OXYCODONE HCL 5 MG PO TABS: 2.5000 mg | ORAL_TABLET | Freq: Two times a day (BID) | ORAL | 0 refills | Status: DC

## 2017-12-24 NOTE — Progress Notes (Signed)
Safety precautions to be maintained throughout the outpatient stay will include: orient to surroundings, keep bed in low position, maintain call bell within reach at all times, provide assistance with transfer out of bed and ambulation.  

## 2017-12-24 NOTE — Patient Instructions (Addendum)
__________You have been given a script for oxycodone today.  You have been given pre procedure instructions.  You have been instructed to get xrays done.  __________________________________________________________________________________  Preparing for Procedure with Sedation  Instructions: . Oral Intake: Do not eat or drink anything for at least 8 hours prior to your procedure. . Transportation: Public transportation is not allowed. Bring an adult driver. The driver must be physically present in our waiting room before any procedure can be started. Marland Kitchen Physical Assistance: Bring an adult physically capable of assisting you, in the event you need help. This adult should keep you company at home for at least 6 hours after the procedure. . Blood Pressure Medicine: Take your blood pressure medicine with a sip of water the morning of the procedure. . Blood thinners: Notify our staff if you are taking any blood thinners. Depending on which one you take, there will be specific instructions on how and when to stop it. . Diabetics on insulin: Notify the staff so that you can be scheduled 1st case in the morning. If your diabetes requires high dose insulin, take only  of your normal insulin dose the morning of the procedure and notify the staff that you have done so. . Preventing infections: Shower with an antibacterial soap the morning of your procedure. . Build-up your immune system: Take 1000 mg of Vitamin C with every meal (3 times a day) the day prior to your procedure. Marland Kitchen Antibiotics: Inform the staff if you have a condition or reason that requires you to take antibiotics before dental procedures. . Pregnancy: If you are pregnant, call and cancel the procedure. . Sickness: If you have a cold, fever, or any active infections, call and cancel the procedure. . Arrival: You must be in the facility at least 30 minutes prior to your scheduled procedure. . Children: Do not bring children with you. . Dress  appropriately: Bring dark clothing that you would not mind if they get stained. . Valuables: Do not bring any jewelry or valuables.  Procedure appointments are reserved for interventional treatments only. Marland Kitchen No Prescription Refills. . No medication changes will be discussed during procedure appointments. . No disability issues will be discussed.  Reasons to call and reschedule or cancel your procedure: (Following these recommendations will minimize the risk of a serious complication.) . Surgeries: Avoid having procedures within 2 weeks of any surgery. (Avoid for 2 weeks before or after any surgery). . Flu Shots: Avoid having procedures within 2 weeks of a flu shots or . (Avoid for 2 weeks before or after immunizations). . Barium: Avoid having a procedure within 7-10 days after having had a radiological study involving the use of radiological contrast. (Myelograms, Barium swallow or enema study). . Heart attacks: Avoid any elective procedures or surgeries for the initial 6 months after a "Myocardial Infarction" (Heart Attack). . Blood thinners: It is imperative that you stop these medications before procedures. Let us know if you if you take any blood thinner.  . Infection: Avoid procedures during or within two weeks of an infection (including chest colds or gastrointestinal problems). Symptoms associated with infections include: Localized redness, fever, chills, night sweats or profuse sweating, burning sensation when voiding, cough, congestion, stuffiness, runny nose, sore throat, diarrhea, nausea, vomiting, cold or Flu symptoms, recent or current infections. It is specially important if the infection is over the area that we intend to treat. Marland Kitchen Heart and lung problems: Symptoms that may suggest an active cardiopulmonary problem include: cough, chest pain,  breathing difficulties or shortness of breath, dizziness, ankle swelling, uncontrolled high or unusually low blood pressure, and/or palpitations. If  you are experiencing any of these symptoms, cancel your procedure and contact your primary care physician for an evaluation.  Remember:  Regular Business hours are:  Monday to Thursday 8:00 AM to 4:00 PM  Provider's Schedule: Milinda Pointer, MD:  Procedure days: Tuesday and Thursday 7:30 AM to 4:00 PM  Gillis Santa, MD:  Procedure days: Monday and Wednesday 7:30 AM to 4:00 PM ____________________________________________________________________________________________   ____________________________________________________________________________________________  Medication Rules  Applies to: All patients receiving prescriptions (written or electronic).  Pharmacy of record: Pharmacy where electronic prescriptions will be sent. If written prescriptions are taken to a different pharmacy, please inform the nursing staff. The pharmacy listed in the electronic medical record should be the one where you would like electronic prescriptions to be sent.  Prescription refills: Only during scheduled appointments. Applies to both, written and electronic prescriptions.  NOTE: The following applies primarily to controlled substances (Opioid* Pain Medications).   Patient's responsibilities: 1. Pain Pills: Bring all pain pills to every appointment (except for procedure appointments). 2. Pill Bottles: Bring pills in original pharmacy bottle. Always bring newest bottle. Bring bottle, even if empty. 3. Medication refills: You are responsible for knowing and keeping track of what medications you need refilled. The day before your appointment, write a list of all prescriptions that need to be refilled. Bring that list to your appointment and give it to the admitting nurse. Prescriptions will be written only during appointments. If you forget a medication, it will not be "Called in", "Faxed", or "electronically sent". You will need to get another appointment to get these prescribed. 4. Prescription  Accuracy: You are responsible for carefully inspecting your prescriptions before leaving our office. Have the discharge nurse carefully go over each prescription with you, before taking them home. Make sure that your name is accurately spelled, that your address is correct. Check the name and dose of your medication to make sure it is accurate. Check the number of pills, and the written instructions to make sure they are clear and accurate. Make sure that you are given enough medication to last until your next medication refill appointment. 5. Taking Medication: Take medication as prescribed. Never take more pills than instructed. Never take medication more frequently than prescribed. Taking less pills or less frequently is permitted and encouraged, when it comes to controlled substances (written prescriptions).  6. Inform other Doctors: Always inform, all of your healthcare providers, of all the medications you take. 7. Pain Medication from other Providers: You are not allowed to accept any additional pain medication from any other Doctor or Healthcare provider. There are two exceptions to this rule. (see below) In the event that you require additional pain medication, you are responsible for notifying us, as stated below. 8. Medication Agreement: You are responsible for carefully reading and following our Medication Agreement. This must be signed before receiving any prescriptions from our practice. Safely store a copy of your signed Agreement. Violations to the Agreement will result in no further prescriptions. (Additional copies of our Medication Agreement are available upon request.) 9. Laws, Rules, & Regulations: All patients are expected to follow all Federal and [employer], [employer], Rules, [employer]. Ignorance of the Laws does not constitute a valid excuse. The use of any illegal substances is prohibited. 10. Adopted CDC guidelines & recommendations: Target dosing levels will be at or below 60  MME/day. Use of benzodiazepines** is  not recommended.  Exceptions: There are only two exceptions to the rule of not receiving pain medications from other Healthcare Providers. 1. Exception #1 (Emergencies): In the event of an emergency (i.e.: accident requiring emergency care), you are allowed to receive additional pain medication. However, you are responsible for: As soon as you are able, call our office [phone], at any time of the day or night, and leave a message stating your name, the date and nature of the emergency, and the name and dose of the medication prescribed. In the event that your call is answered by a member of our staff, make sure to document and save the date, time, and the name of the person that took your information.  2. Exception #2 (Planned Surgery): In the event that you are scheduled by another doctor or dentist to have any type of surgery or procedure, you are allowed (for a period no longer than 30 days), to receive additional pain medication, for the acute post-op pain. However, in this case, you are responsible for picking up a copy of our "Post-op Pain Management for Surgeons" handout, and giving it to your surgeon or dentist. This document is available at our office, and does not require an appointment to obtain it. Simply go to our office during business hours (Monday-Thursday from 8:00 AM to 4:00 PM) (Friday 8:00 AM to 12:00 Noon) or if you have a scheduled appointment with [place], prior to your surgery, and ask for it by name. In addition, you will need to provide [place] with your name, name of your surgeon, type of surgery, and date of procedure or surgery.  *Opioid medications include: morphine, codeine, oxycodone, oxymorphone, hydrocodone, hydromorphone, meperidine, tramadol, tapentadol, buprenorphine, fentanyl, methadone. **Benzodiazepine medications include: diazepam (Valium), alprazolam (Xanax), clonazepam (Klonopine), lorazepam (Ativan), clorazepate (Tranxene),  chlordiazepoxide (Librium), estazolam (Prosom), oxazepam (Serax), temazepam (Restoril), triazolam (Halcion) (Last updated: 06/19/2017) ____________________________________________________________________________________________   ____________________________________________________________________________________________  Medication Recommendations and Reminders  Applies to: All patients receiving prescriptions (written and/or electronic).  Medication Rules & Regulations: These rules and regulations exist for your safety and that of others. They are not flexible and neither are we. Dismissing or ignoring them will be considered "non-compliance" with medication therapy, resulting in complete and irreversible termination of such therapy. (See document titled "Medication Rules" for more details.) In all conscience, because of safety reasons, we cannot continue providing a therapy where the patient does not follow instructions.  Pharmacy of record:   Definition: This is the pharmacy where your electronic prescriptions will be sent.   We do not endorse any particular pharmacy.  You are not restricted in your choice of pharmacy.  The pharmacy listed in the electronic medical record should be the one where you want electronic prescriptions to be sent.  If you choose to change pharmacy, simply notify our nursing staff of your choice of new pharmacy.  Recommendations:  Keep all of your pain medications in a safe place, under lock and key, even if you live alone.   After you fill your prescription, take 1 week's worth of pills and put them away in a safe place. You should keep a separate, properly labeled bottle for this purpose. The remainder should be kept in the original bottle. Use this as your primary supply, until it runs out. Once it's gone, then you know that you have 1 week's worth of medicine, and it is time to come in for a prescription refill. If you do this correctly, it is unlikely  that you will  ever run out of medicine.  To make sure that the above recommendation works, it is very important that you make sure your medication refill appointments are scheduled at least 1 week before you run out of medicine. To do this in an effective manner, make sure that you do not leave the office without scheduling your next medication management appointment. Always ask the nursing staff to show you in your prescription , when your medication will be running out. Then arrange for the receptionist to get you a return appointment, at least 7 days before you run out of medicine. Do not wait until you have 1 or 2 pills left, to come in. This is very poor planning and does not take into consideration that we may need to cancel appointments due to bad weather, sickness, or emergencies affecting our staff.  "Partial Fill": If for any reason your pharmacy does not have enough pills/tablets to completely fill or refill your prescription, do not allow for a "partial fill". You will need a separate prescription to fill the remaining amount, which we will not provide. If the reason for the partial fill is your insurance, you will need to talk to the pharmacist about payment alternatives for the remaining tablets, but again, do not accept a partial fill.  Prescription refills and/or changes in medication(s):   Prescription refills, and/or changes in dose or medication, will be conducted only during scheduled medication management appointments. (Applies to both, written and electronic prescriptions.)  No refills on procedure days. No medication will be changed or started on procedure days. No changes, adjustments, and/or refills will be conducted on a procedure day. Doing so will interfere with the diagnostic portion of the procedure.  No phone refills. No medications will be "called into the pharmacy".  No Fax refills.  No weekend refills.  No Holliday refills.  No after hours refills.  Remember:   Business hours are:  Monday to Thursday 8:00 AM to 4:00 PM Provider's Schedule: Dionisio David, NP - Appointments are:  Medication management: Monday to Thursday 8:00 AM to 4:00 PM Milinda Pointer, MD - Appointments are:  Medication management: Monday and Wednesday 8:00 AM to 4:00 PM Procedure day: Tuesday and Thursday 7:30 AM to 4:00 PM Gillis Santa, MD - Appointments are:  Medication management: Tuesday and Thursday 8:00 AM to 4:00 PM Procedure day: Monday and Wednesday 7:30 AM to 4:00 PM (Last update: 06/19/2017) ____________________________________________________________________________________________   ____________________________________________________________________________________________  CANNABIDIOL (AKA: CBD Oil or Pills)  Applies to: All patients receiving prescriptions of controlled substances (written and/or electronic).  General Information: Cannabidiol (CBD) was discovered in [birth year]. It is one of some 113 identified cannabinoids in cannabis (Marijuana) plants, accounting for up to 40% of the plant's extract. As of 2018, preliminary clinical research on cannabidiol included studies of anxiety, cognition, movement disorders, and pain.  Cannabidiol is consummed in multiple ways, including inhalation of cannabis smoke or vapor, as an aerosol spray into the cheek, and by mouth. It may be supplied as CBD oil containing CBD as the active ingredient (no added tetrahydrocannabinol (THC) or terpenes), a full-plant CBD-dominant hemp extract oil, capsules, dried cannabis, or as a liquid solution. CBD is thought not have the same psychoactivity as THC, and may affect the actions of THC. Studies suggest that CBD may interact with different biological targets, including cannabinoid receptors and other neurotransmitter receptors. As of 2018 the mechanism of action for its biological effects has not been determined.  In the [place], cannabidiol has a limited approval  by the Food and  Drug Administration (FDA) for treatment of only two types of epilepsy disorders. The side effects of long-term use of the drug include somnolence, decreased appetite, diarrhea, fatigue, malaise, weakness, sleeping problems, and others.  CBD remains a Schedule I drug prohibited for any use.  Legality: Some manufacturers ship CBD products nationally, an illegal action which the FDA has not enforced in 2018, with CBD remaining the subject of an FDA investigational new drug evaluation, and is not considered legal as a dietary supplement or food ingredient as of December 2018. Federal illegality has made it difficult historically to conduct research on CBD. CBD is openly sold in head shops and health food stores in some states where such sales have not been explicitly legalized.  Warning: Because it is not FDA approved for general use or treatment of pain, it is not required to undergo the same manufacturing controls as prescription drugs.  This means that the available cannabidiol (CBD) may be contaminated with THC.  If this is the case, it will trigger a positive urine drug screen (UDS) test for cannabinoids (Marijuana).  Because a positive UDS for illicit substances is a violation of our medication agreement, your opioid analgesics (pain medicine) may be permanently discontinued. (Last update: 07/10/2017) ____________________________________________________________________________________________   Preparing for Procedure with Sedation Instructions: . Oral Intake: Do not eat or drink anything for at least 8 hours prior to your procedure. . Transportation: Public transportation is not allowed. Bring an adult driver. The driver must be physically present in our waiting room before any procedure can be started. Marland Kitchen Physical Assistance: Bring an adult capable of physically assisting you, in the event you need help. . Blood Pressure Medicine: Take your blood pressure medicine with a sip of water the morning  of the procedure. . Insulin: Take only  of your normal insulin dose. . Preventing infections: Shower with an antibacterial soap the morning of your procedure. . Build-up your immune system: Take 1000 mg of Vitamin C with every meal (3 times a day) the day prior to your procedure. . Pregnancy: If you are pregnant, call and cancel the procedure. . Sickness: If you have a cold, fever, or any active infections, call and cancel the procedure. . Arrival: You must be in the facility at least 30 minutes prior to your scheduled procedure. . Children: Do not bring children with you. . Dress appropriately: Bring dark clothing that you would not mind if they get stained. . Valuables: Do not bring any jewelry or valuables. Procedure appointments are reserved for interventional treatments only. Marland Kitchen No Prescription Refills. . No medication changes will be discussed during procedure appointments. . No disability issues will be discussed.

## 2017-12-25 ENCOUNTER — Encounter: Payer: Self-pay | Admitting: Urology

## 2017-12-25 ENCOUNTER — Telehealth: Payer: Self-pay | Admitting: Pharmacy Technician

## 2017-12-25 ENCOUNTER — Telehealth: Payer: Self-pay | Admitting: Urology

## 2017-12-25 MED ORDER — LENALIDOMIDE 5 MG PO CAPS: 5.0000 mg | ORAL_CAPSULE | Freq: Every day | ORAL | 0 refills | Status: DC

## 2017-12-25 NOTE — Telephone Encounter (Signed)
Pt's Rx for Sildenafil was sent to [facility] and should have been sent to [facility].  Can we please change this and notify pt when this is done.

## 2017-12-25 NOTE — Telephone Encounter (Addendum)
Oral Chemotherapy Pharmacist Encounter   Revlimid prescription sent to [facility]. This is a limited distribution medication that can not be filled at the [facility].   Mr. Sitzer had a change in his insurance to a [employer]. I rerouted the prescription to [facility] who can fill limited distribution medication for [employer].   Darl Pikes, PharmD, BCPS, [facility] Hematology/Oncology Clinical Pharmacist [facility] [phone]  12/25/2017 12:19 PM

## 2017-12-25 NOTE — Addendum Note (Signed)
Addended by: Darl Pikes on: 12/25/2017 12:40 PM   Modules accepted: Orders

## 2017-12-25 NOTE — Telephone Encounter (Signed)
[name]  Received notification from [employer] that prior authorization for Revlimid is required.    Patient has had a change in insurance and a new PA is required.  PA submitted on CoverMyMeds Key [id number] Status is pending  Oral Oncology Clinic will continue to follow.  [name] Phone [phone] Fax [phone] 12/25/2017 2:12 PM

## 2017-12-26 ENCOUNTER — Other Ambulatory Visit: Payer: Self-pay

## 2017-12-26 ENCOUNTER — Other Ambulatory Visit: Payer: Self-pay | Admitting: *Deleted

## 2017-12-26 DIAGNOSIS — C9001 Multiple myeloma in remission: Secondary | ICD-10-CM

## 2017-12-26 MED ORDER — LENALIDOMIDE 5 MG PO CAPS: 5.0000 mg | ORAL_CAPSULE | Freq: Every day | ORAL | 0 refills | Status: DC

## 2017-12-26 NOTE — Telephone Encounter (Addendum)
He has had a change in insurance to [employer]. Patient now required a new PA and has to use [facility] to fill his Revlimid. We are actively working on getting him set-up with them.

## 2017-12-26 NOTE — Telephone Encounter (Signed)
[name]  Prior Authorization for Revlimid has been approved.    PA# [id number] Effective dates: 12/24/17 through 12/26/2018  Due to patients change in insurance, Revlimid will be filled with [facility] now. I will call to check the status of the prescription and when it will be delivered.  I will call patient to inform him of this change and to expect a call from [place].  Oral Oncology Clinic will continue to follow.   [place] Patient [place] Phone [phone] Fax [phone] 12/26/2017 9:16 AM

## 2017-12-26 NOTE — Telephone Encounter (Signed)
Not sure that this is the correct pharmacy he gets his medicine form. Please check and it seems too early to refill this prescription right now

## 2017-12-26 NOTE — Addendum Note (Signed)
Addended by: Darl Pikes on: 12/26/2017 03:23 PM   Modules accepted: Orders

## 2017-12-29 ENCOUNTER — Other Ambulatory Visit: Payer: Self-pay

## 2017-12-29 ENCOUNTER — Other Ambulatory Visit: Payer: No Typology Code available for payment source

## 2017-12-29 DIAGNOSIS — E291 Testicular hypofunction: Secondary | ICD-10-CM

## 2017-12-30 LAB — TESTOSTERONE: TESTOSTERONE: 570 ng/dL (ref 264–916)

## 2017-12-30 MED ORDER — SILDENAFIL CITRATE 20 MG PO TABS: ORAL_TABLET | ORAL | 0 refills | Status: DC

## 2017-12-30 NOTE — Addendum Note (Signed)
Addended by: Abbie Sons on: 12/30/2017 03:55 PM   Modules accepted: Orders

## 2017-12-30 NOTE — Telephone Encounter (Signed)
Rx was sent  

## 2017-12-30 NOTE — Telephone Encounter (Signed)
I do not see this in his medications, can you please send this?

## 2017-12-31 ENCOUNTER — Other Ambulatory Visit: Payer: Self-pay

## 2018-01-02 ENCOUNTER — Ambulatory Visit
Admission: RE | Admit: 2018-01-02 | Discharge: 2018-01-02 | Disposition: A | Discharge status: Home / Self Care | Payer: No Typology Code available for payment source | Source: Ambulatory Visit | Attending: Pain Medicine | Admitting: Pain Medicine

## 2018-01-02 ENCOUNTER — Encounter: Payer: Self-pay | Admitting: Pain Medicine

## 2018-01-02 DIAGNOSIS — C9001 Multiple myeloma in remission: Secondary | ICD-10-CM | POA: Insufficient documentation

## 2018-01-02 DIAGNOSIS — G8929 Other chronic pain: Secondary | ICD-10-CM | POA: Diagnosis not present

## 2018-01-02 DIAGNOSIS — M5441 Lumbago with sciatica, right side: Secondary | ICD-10-CM | POA: Diagnosis not present

## 2018-01-02 DIAGNOSIS — M25552 Pain in left hip: Secondary | ICD-10-CM | POA: Insufficient documentation

## 2018-01-02 DIAGNOSIS — M5388 Other specified dorsopathies, sacral and sacrococcygeal region: Secondary | ICD-10-CM | POA: Insufficient documentation

## 2018-01-02 DIAGNOSIS — M533 Sacrococcygeal disorders, not elsewhere classified: Secondary | ICD-10-CM | POA: Diagnosis not present

## 2018-01-02 DIAGNOSIS — M5442 Lumbago with sciatica, left side: Secondary | ICD-10-CM | POA: Diagnosis not present

## 2018-01-02 DIAGNOSIS — M5136 Other intervertebral disc degeneration, lumbar region: Secondary | ICD-10-CM

## 2018-01-02 DIAGNOSIS — M79605 Pain in left leg: Secondary | ICD-10-CM | POA: Insufficient documentation

## 2018-01-02 DIAGNOSIS — M25551 Pain in right hip: Secondary | ICD-10-CM | POA: Diagnosis present

## 2018-01-02 DIAGNOSIS — M79604 Pain in right leg: Secondary | ICD-10-CM | POA: Insufficient documentation

## 2018-01-02 MED ORDER — GADOBENATE DIMEGLUMINE 529 MG/ML IV SOLN
15.0000 mL | Freq: Once | INTRAVENOUS | Status: AC | PRN
  Administered 2018-01-02: 15 mL via INTRAVENOUS

## 2018-01-06 ENCOUNTER — Encounter: Payer: Self-pay | Admitting: Oncology

## 2018-01-06 ENCOUNTER — Other Ambulatory Visit: Payer: Self-pay

## 2018-01-06 MED ORDER — GABAPENTIN 600 MG PO TABS: 600.0000 mg | ORAL_TABLET | Freq: Three times a day (TID) | ORAL | 0 refills | Status: DC

## 2018-01-08 ENCOUNTER — Telehealth: Payer: Self-pay | Admitting: Pharmacy Technician

## 2018-01-08 NOTE — Telephone Encounter (Signed)
[name]  Spoke with Mr Mcgrail to verify he received Revlimid from [facility].  He confirmed he received his medication on 01/02/18.    [place] Patient [place] Phone [phone] Fax [phone] 01/08/2018 3:08 PM

## 2018-01-12 ENCOUNTER — Inpatient Hospital Stay: Payer: No Typology Code available for payment source

## 2018-01-12 ENCOUNTER — Other Ambulatory Visit: Payer: Self-pay | Admitting: Pain Medicine

## 2018-01-12 ENCOUNTER — Inpatient Hospital Stay: Payer: No Typology Code available for payment source | Attending: Oncology

## 2018-01-12 DIAGNOSIS — G8929 Other chronic pain: Secondary | ICD-10-CM

## 2018-01-12 DIAGNOSIS — M5441 Lumbago with sciatica, right side: Principal | ICD-10-CM

## 2018-01-12 DIAGNOSIS — M5442 Lumbago with sciatica, left side: Principal | ICD-10-CM

## 2018-01-12 DIAGNOSIS — C9001 Multiple myeloma in remission: Secondary | ICD-10-CM | POA: Insufficient documentation

## 2018-01-12 DIAGNOSIS — M47816 Spondylosis without myelopathy or radiculopathy, lumbar region: Secondary | ICD-10-CM

## 2018-01-12 DIAGNOSIS — M5136 Other intervertebral disc degeneration, lumbar region: Secondary | ICD-10-CM

## 2018-01-12 LAB — COMPREHENSIVE METABOLIC PANEL
ALBUMIN: 4.1 g/dL (ref 3.5–5.0)
ALT: 43 U/L (ref 0–44)
ANION GAP: 10 (ref 5–15)
AST: 31 U/L (ref 15–41)
Alkaline Phosphatase: 85 U/L (ref 38–126)
BUN: 17 mg/dL (ref 6–20)
CO2: 24 mmol/L (ref 22–32)
Calcium: 9.5 mg/dL (ref 8.9–10.3)
Chloride: 104 mmol/L (ref 98–111)
Creatinine, Ser: 1.64 mg/dL — ABNORMAL HIGH (ref 0.61–1.24)
GFR calc non Af Amer: 48 mL/min — ABNORMAL LOW (ref >60)
GFR, EST AFRICAN AMERICAN: 56 mL/min — AB (ref >60)
GLUCOSE: 121 mg/dL — AB (ref 70–99)
POTASSIUM: 3.9 mmol/L (ref 3.5–5.1)
SODIUM: 138 mmol/L (ref 135–145)
TOTAL PROTEIN: 7 g/dL (ref 6.5–8.1)
Total Bilirubin: 0.6 mg/dL (ref 0.3–1.2)

## 2018-01-12 LAB — CBC WITH DIFFERENTIAL/PLATELET
BASOS ABS: 0 10*3/uL (ref 0–0.1)
BASOS PCT: 1 %
Eosinophils Absolute: 0.4 10*3/uL (ref 0–0.7)
Eosinophils Relative: 6 %
HEMATOCRIT: 41.4 % (ref 40.0–52.0)
HEMOGLOBIN: 14.8 g/dL (ref 13.0–18.0)
LYMPHS PCT: 14 %
Lymphs Abs: 0.9 10*3/uL — ABNORMAL LOW (ref 1.0–3.6)
MCH: 33.4 pg (ref 26.0–34.0)
MCHC: 35.7 g/dL (ref 32.0–36.0)
MCV: 93.8 fL (ref 80.0–100.0)
MONO ABS: 0.4 10*3/uL (ref 0.2–1.0)
MONOS PCT: 7 %
NEUTROS ABS: 4.6 10*3/uL (ref 1.4–6.5)
NEUTROS PCT: 72 %
Platelets: 167 10*3/uL (ref 150–440)
RBC: 4.41 MIL/uL (ref 4.40–5.90)
RDW: 13 % (ref 11.5–14.5)
WBC: 6.3 10*3/uL (ref 3.8–10.6)

## 2018-01-12 MED ORDER — DENOSUMAB 120 MG/1.7ML ~~LOC~~ SOLN
120.0000 mg | SUBCUTANEOUS | Status: DC
  Administered 2018-01-12: 120 mg via SUBCUTANEOUS

## 2018-01-13 LAB — MULTIPLE MYELOMA PANEL, SERUM
ALBUMIN SERPL ELPH-MCNC: 3.8 g/dL (ref 2.9–4.4)
ALPHA 1: 0.2 g/dL (ref 0.0–0.4)
ALPHA2 GLOB SERPL ELPH-MCNC: 0.8 g/dL (ref 0.4–1.0)
Albumin/Glob SerPl: 1.5 (ref 0.7–1.7)
B-GLOBULIN SERPL ELPH-MCNC: 1 g/dL (ref 0.7–1.3)
Gamma Glob SerPl Elph-Mcnc: 0.8 g/dL (ref 0.4–1.8)
Globulin, Total: 2.7 g/dL (ref 2.2–3.9)
IGG (IMMUNOGLOBIN G), SERUM: 888 mg/dL (ref 700–1600)
IgA: 180 mg/dL (ref 90–386)
IgM (Immunoglobulin M), Srm: 42 mg/dL (ref 20–172)
TOTAL PROTEIN ELP: 6.5 g/dL (ref 6.0–8.5)

## 2018-01-13 LAB — KAPPA/LAMBDA LIGHT CHAINS
Kappa free light chain: 26.2 mg/L — ABNORMAL HIGH (ref 3.3–19.4)
Kappa, lambda light chain ratio: 1.58 (ref 0.26–1.65)
Lambda free light chains: 16.6 mg/L (ref 5.7–26.3)

## 2018-01-16 ENCOUNTER — Ambulatory Visit (INDEPENDENT_AMBULATORY_CARE_PROVIDER_SITE_OTHER): Payer: No Typology Code available for payment source | Admitting: Urology

## 2018-01-16 ENCOUNTER — Encounter: Payer: Self-pay | Admitting: Urology

## 2018-01-16 ENCOUNTER — Encounter: Payer: Self-pay | Admitting: Pain Medicine

## 2018-01-16 VITALS — BP 138/77 | HR 77 | Ht 67.0 in | Wt 156.9 lb

## 2018-01-16 DIAGNOSIS — N521 Erectile dysfunction due to diseases classified elsewhere: Secondary | ICD-10-CM

## 2018-01-16 NOTE — Progress Notes (Signed)
01/16/2018 11:23 AM   James House. 22-Apr-1971 [id number]  Referring provider: Pleas Koch, NP [place], [place] [address]  Chief Complaint  Patient presents with  . Erectile Dysfunction    HPI: 47 year old male previously seen for erectile dysfunction.  He was given a trial of sildenafil and he states this was not effective.  He does not have partial erections and had no erectile activity with the sildenafil.  His most significant organic risk factor is chronic back pain with neuropathy and chronic kidney disease.   PMH: Past Medical History:  Diagnosis Date  . CKD (chronic kidney disease) stage 3, GFR 30-59 ml/min (HCC)   . Hypertension   . Multiple myeloma ([facility]) 08/11/2017  . Neuropathy   . Pneumonia     Surgical History: Past Surgical History:  Procedure Laterality Date  . ABDOMINAL SURGERY    . JOINT REPLACEMENT     right knee  . KNEE SURGERY Left     Home Medications:  Allergies as of 01/16/2018   No Known Allergies     Medication List        Accurate as of 01/16/18 11:23 AM. Always use your most recent med list.          acetaminophen 325 MG tablet Commonly known as:  TYLENOL Take 2 tablets (650 mg total) by mouth every 6 (six) hours as needed for mild pain (or Fever >/= 101).   acyclovir 200 MG capsule Commonly known as:  ZOVIRAX Take 200 mg by mouth 2 (two) times daily.   aspirin EC 81 MG tablet Take 81 mg by mouth daily.   gabapentin 600 MG tablet Commonly known as:  NEURONTIN Take 1 tablet (600 mg total) by mouth 3 (three) times daily.   HYDROcodone-acetaminophen 5-325 MG tablet Commonly known as:  NORCO/VICODIN   lenalidomide 5 MG capsule Commonly known as:  REVLIMID Take 1 capsule (5 mg total) by mouth daily.   meloxicam 15 MG tablet Commonly known as:  MOBIC Take 1 tablet (15 mg total) by mouth daily.   oxyCODONE 5 MG immediate release tablet Commonly known as:  Oxy IR/ROXICODONE Take 0.5 tablets (2.5 mg  total) by mouth 2 (two) times daily.   pantoprazole 40 MG tablet Commonly known as:  PROTONIX Take 40 mg by mouth daily.   pregabalin 150 MG capsule Commonly known as:  LYRICA Take 150 mg by mouth 2 (two) times daily.   promethazine 25 MG tablet Commonly known as:  PHENERGAN   sildenafil 20 MG tablet Commonly known as:  REVATIO 2-5 tabs 1 hour prior to intercourse   tiZANidine 4 MG tablet Commonly known as:  ZANAFLEX Take 4 mg by mouth every 8 (eight) hours as needed for muscle spasms.   Vitamin D (Ergocalciferol) 50000 units Caps capsule Commonly known as:  DRISDOL Take 50,000 Units by mouth every 7 (seven) days. Takes on Monday       Allergies: No Known Allergies  Family History: Family History  Problem Relation Age of Onset  . Cancer Mother 49       Pancreatic  . COPD Mother   . Diabetes Mother   . Hyperlipidemia Mother   . Hypertension Mother   . Cancer Maternal Uncle        pancreatic  . Cancer Maternal Grandmother 72       colon  . COPD Maternal Grandmother   . Diabetes Maternal Grandmother   . Hypertension Maternal Grandmother  Social History:  reports that he quit smoking about 4 years ago. His smokeless tobacco use includes snuff. He reports that he does not drink alcohol or use drugs.  ROS: UROLOGY Frequent Urination?: No Hard to postpone urination?: No Burning/pain with urination?: No Get up at night to urinate?: No Leakage of urine?: No Urine stream starts and stops?: Yes Trouble starting stream?: No Do you have to strain to urinate?: Yes Blood in urine?: No Urinary tract infection?: No Sexually transmitted disease?: No Injury to kidneys or bladder?: No Painful intercourse?: No Weak stream?: No Erection problems?: Yes Penile pain?: No  Gastrointestinal Nausea?: No Vomiting?: No Indigestion/heartburn?: No Diarrhea?: No Constipation?: No  Constitutional Fever: No Night sweats?: No Weight loss?: No Fatigue?: No  Skin Skin  rash/lesions?: No Itching?: No  Eyes Blurred vision?: No Double vision?: No  Ears/Nose/Throat Sore throat?: No Sinus problems?: No  Hematologic/Lymphatic Swollen glands?: No Easy bruising?: No  Cardiovascular Leg swelling?: No Chest pain?: No  Respiratory Cough?: No Shortness of breath?: No  Endocrine Excessive thirst?: No  Musculoskeletal Back pain?: Yes Joint pain?: Yes  Neurological Headaches?: No Dizziness?: No  Psychologic Depression?: No Anxiety?: No  Physical Exam: BP 138/77 (BP Location: Left Arm, Patient Position: Sitting, Cuff Size: Normal)   Pulse 77   Ht _0  (1.702 m)   Wt 156 lb 14.4 oz (71.2 kg)   BMI 24.57 kg/m   Constitutional:  Alert and oriented, No acute distress. HEENT: [place] AT, moist mucus membranes.  Trachea midline, no masses. Cardiovascular: No clubbing, cyanosis, or edema. Respiratory: Normal respiratory effort, no increased work of breathing. Skin: No rashes, bruises or suspicious lesions. Neurologic: Grossly intact, no focal deficits, moving all 4 extremities. Psychiatric: Normal mood and affect.   Assessment & Plan:   47 year old male with erectile dysfunction and no improvement on sildenafil.  I discussed additional options including intracavernosal injections, intraurethral prostaglandin, vacuum erection devices and penile implant surgery.  He was interested in starting intracavernosal injections.  He will return for a test dose/injection training.  He was offered compounded Trimix versus commercial Edex.  The pros and cons of each method were discussed.  He will think over and let us know which he wants.   Abbie Sons, [place] [address], [place], [place] [address] [phone]

## 2018-01-21 ENCOUNTER — Telehealth: Payer: Self-pay

## 2018-01-21 ENCOUNTER — Encounter: Payer: Self-pay | Admitting: Oncology

## 2018-01-21 NOTE — Telephone Encounter (Signed)
Patient notified that no meds will be prescribed outside of appointment.

## 2018-01-21 NOTE — Telephone Encounter (Signed)
The patient called the cancer center asking for a muscle relaxer. They don't want to give him anything because he is in pain management. The patient seems to think that we will not call out a muscle relaxer without him coming in. Is this something that Dr. Dossie Arbour will do? Please call the patient and let him know either way. Thanks

## 2018-01-22 ENCOUNTER — Encounter: Payer: Self-pay | Admitting: Urology

## 2018-01-25 NOTE — Progress Notes (Signed)
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 Mild chronic cortical irregularity is noted at the intercondylar notch and tibial spine.  A small knee joint effusion is noted. Mild edema is noted at Hoffa's fat pad.  IMPRESSION: 1. No evidence of fracture or dislocation. 2. Lateral partial arthroplasty is grossly unremarkable in appearance, without evidence of loosening. 3. Small knee joint effusion noted.   Electronically Signed   By: Garald Balding M.D.   On: 05/12/2015 18:32    Knee-L DG 4 views:  Results for orders placed during the hospital encounter of 08/23/17  DG Knee Complete 4 Views Left   Narrative CLINICAL DATA:  Left knee pain. Syncopal episode. Post syncope left knee pain. History of  left partial joint replacement 3 years ago.  EXAM: LEFT KNEE - COMPLETE 4+ VIEW  COMPARISON:  AP and lateral views of the left knee of May 12, 2015  FINDINGS: The bones are subjectively adequately mineralized. No acute native bone fracture is observed. The prosthesis in the medial compartment appears intact and appropriately positioned. The lateral and patellofemoral joint spaces are reasonably well-maintained. The soft tissues of the knee are unremarkable.  IMPRESSION: There is no acute or significant chronic bony abnormality of the left knee. The prosthetic medial joint compartment structures appear intact and appropriately positioned.   Electronically Signed   By: David  Martinique M.D.   On: 08/25/2017 14:35    Complexity Note: Imaging results reviewed. Results shared with James House, using Layman's terms.                         Meds   Current Outpatient Medications:  .  acetaminophen (TYLENOL) 325 MG tablet, Take 2 tablets (650 mg total) by mouth every 6 (six) hours as needed for mild pain (or Fever >/= 101)., Disp: , Rfl:  .  acyclovir (ZOVIRAX) 200 MG capsule, Take  200 mg by mouth 2 (two) times daily. , Disp: , Rfl:  .  gabapentin (NEURONTIN) 600 MG tablet, Take 1 tablet (600 mg total) by mouth 3 (three) times daily., Disp: 90 tablet, Rfl: 0 .  lenalidomide (REVLIMID) 5 MG capsule, Take 1 capsule (5 mg total) by mouth daily., Disp: 28 capsule, Rfl: 0 .  oxyCODONE (OXY IR/ROXICODONE) 5 MG immediate release tablet, Take 0.5 tablets (2.5 mg total) by mouth 2 (two) times daily., Disp: 30 tablet, Rfl: 0 .  pantoprazole (PROTONIX) 40 MG tablet, Take 40 mg by mouth daily., Disp: , Rfl:  .  pregabalin (LYRICA) 150 MG capsule, Take 1 capsule (150 mg total) by mouth 2 (two) times daily., Disp: 60 capsule, Rfl: 0 .  promethazine (PHENERGAN) 25 MG tablet, , Disp: , Rfl: 0 .  Vitamin D, Ergocalciferol, (DRISDOL) 50000 units CAPS capsule, Take 50,000 Units by mouth every 7 (seven)  days. Takes on Monday, Disp: , Rfl:  .  aspirin EC 81 MG tablet, Take 81 mg by mouth daily., Disp: , Rfl:  .  baclofen (LIORESAL) 10 MG tablet, Take 1 tablet (10 mg total) by mouth 3 (three) times daily., Disp: 90 tablet, Rfl: 0  ROS  Constitutional: Denies any fever or chills Gastrointestinal: No reported hemesis, hematochezia, vomiting, or acute GI distress Musculoskeletal: Denies any acute onset joint swelling, redness, loss of ROM, or weakness Neurological: No reported episodes of acute onset apraxia, aphasia, dysarthria, agnosia, amnesia, paralysis, loss of coordination, or loss of consciousness  Allergies  Mr. Borre has No Known Allergies.  [place]  Drug: James House  reports that he does not use drugs. Alcohol:  reports that he does not drink alcohol. Tobacco:  reports that he quit smoking about 4 years ago. His smokeless tobacco use includes snuff. Medical:  has a past medical history of CKD (chronic kidney disease) stage 3, GFR 30-59 ml/min ([place]), Hypertension, Multiple myeloma ([place]) (08/11/2017), Neuropathy, and Pneumonia. Surgical: James House  has a past surgical history that includes Joint replacement; Knee surgery (Left); and Abdominal surgery. Family: family history includes COPD in his maternal grandmother and mother; Cancer in his maternal uncle; Cancer (age of onset: 41) in his maternal grandmother; Cancer (age of onset: 51) in his mother; Diabetes in his maternal grandmother and mother; Hyperlipidemia in his mother; Hypertension in his maternal grandmother and mother.  Constitutional Exam  General appearance: Well nourished, well developed, and well hydrated. In no apparent acute distress Vitals:   01/26/18 0857  BP: (!) 140/91  Pulse: 75  Resp: 16  Temp: 98.4 F (36.9 C)  TempSrc: Oral  SpO2: 100%  Weight: 155 lb (70.3 kg)  Height: 5' 7" (1.702 m)   BMI Assessment: Estimated body mass index is 24.28 kg/m as calculated from the following:   Height as of this encounter:  5' 7" (1.702 m).   Weight as of this encounter: 155 lb (70.3 kg).  BMI interpretation table: BMI level Category Range association with higher incidence of chronic pain  <18 kg/m2 Underweight   18.5-24.9 kg/m2 Ideal body weight   25-29.9 kg/m2 Overweight Increased incidence by 20%  30-34.9 kg/m2 Obese (Class I) Increased incidence by 68%  35-39.9 kg/m2 Severe obesity (Class II) Increased incidence by 136%  >40 kg/m2 Extreme obesity (Class III) Increased incidence by 254%   Patient's current BMI Ideal Body weight  Body mass index is 24.28 kg/m. Ideal body weight: 66.1 kg (145 lb 11.6 oz) Adjusted ideal body weight: 67.8 kg (149 lb 7 oz)  BMI Readings from Last 4 Encounters:  01/26/18 24.28 kg/m  01/16/18 24.57 kg/m  12/24/17 23.49 kg/m  12/22/17 23.49 kg/m   Wt Readings from Last 4 Encounters:  01/26/18 155 lb (70.3 kg)  01/16/18 156 lb 14.4 oz (71.2 kg)  12/24/17 150 lb (68 kg)  12/22/17 150 lb (68 kg)  Psych/Mental status: Alert, oriented x 3 (person, place, & time)       Eyes: PERLA Respiratory: No evidence of acute respiratory distress  Cervical Spine Area Exam  Skin & Axial Inspection: No masses, redness, edema, swelling, or associated skin lesions Alignment: Symmetrical Functional ROM: Unrestricted ROM      Stability: No instability detected Muscle Tone/Strength: Functionally intact. No obvious neuro-muscular anomalies detected. Sensory (Neurological): Unimpaired Palpation: No palpable anomalies              Upper Extremity (UE) Exam    Side: Right upper extremity  Side: Left upper extremity  Skin & Extremity Inspection: Skin color, temperature, and hair growth are WNL. No peripheral edema or cyanosis. No masses, redness, swelling, asymmetry, or associated skin lesions. No contractures.  Skin & Extremity Inspection: Skin color, temperature, and hair growth are WNL. No peripheral edema or cyanosis. No masses, redness, swelling, asymmetry, or associated skin lesions.  No contractures.  Functional ROM: Unrestricted ROM          Functional ROM: Unrestricted ROM          Muscle Tone/Strength: Functionally intact. No obvious neuro-muscular anomalies detected.  Muscle Tone/Strength: Functionally intact. No obvious neuro-muscular anomalies detected.  Sensory (Neurological): Unimpaired          Sensory (Neurological): Unimpaired          Palpation: No palpable anomalies              Palpation: No palpable anomalies              Provocative Test(s):  Phalen's test: deferred Tinel's test: deferred Apley's scratch test (touch opposite shoulder):  Action 1 (Across chest): deferred Action 2 (Overhead): deferred Action 3 (LB reach): deferred   Provocative Test(s):  Phalen's test: deferred Tinel's test: deferred Apley's scratch test (touch opposite shoulder):  Action 1 (Across chest): deferred Action 2 (Overhead): deferred Action 3 (LB reach): deferred    Thoracic Spine Area Exam  Skin & Axial Inspection: No masses, redness, or swelling Alignment: Symmetrical Functional ROM: Unrestricted ROM Stability: No instability detected Muscle Tone/Strength: Functionally intact. No obvious neuro-muscular anomalies detected. Sensory (Neurological): Unimpaired Muscle strength & Tone: No palpable anomalies  Lumbar Spine Area Exam  Skin & Axial Inspection: No masses, redness, or swelling Alignment: Symmetrical Functional ROM: Decreased ROM       Stability: No instability detected Muscle Tone/Strength: Functionally intact. No obvious neuro-muscular anomalies detected. Sensory (Neurological): Movement-associated pain Palpation: No palpable anomalies       Provocative Tests: Hyperextension/rotation test: (+) bilaterally for facet joint pain. Lumbar quadrant test (Kemp's test): (+) bilaterally for facet joint pain. Lateral bending test: deferred today       Patrick's Maneuver: (+) for bilateral S-I arthralgia and for bilateral hip arthralgia FABER test: (+) for  bilateral S-I arthralgia and for bilateral hip arthralgia S-I anterior distraction/compression test: deferred today         S-I lateral compression test: deferred today         S-I Thigh-thrust test: deferred today         S-I Gaenslen's test: deferred today          Gait &  Posture Assessment  Ambulation: Unassisted Gait: Antalgic Posture: Difficulty standing up straight, due to pain   Lower Extremity Exam    Side: Right lower extremity  Side: Left lower extremity  Stability: No instability observed          Stability: No instability observed          Skin & Extremity Inspection: Skin color, temperature, and hair growth are WNL. No peripheral edema or cyanosis. No masses, redness, swelling, asymmetry, or associated skin lesions. No contractures.  Skin & Extremity Inspection: Skin color, temperature, and hair growth are WNL. No peripheral edema or cyanosis. No masses, redness, swelling, asymmetry, or associated skin lesions. No contractures.  Functional ROM: Decreased ROM for hip joint          Functional ROM: Decreased ROM for hip joint          Muscle Tone/Strength: Able to Toe-walk & Heel-walk without problems  Muscle Tone/Strength: Able to Toe-walk & Heel-walk without problems  Sensory (Neurological): Arthropathic arthralgia  Sensory (Neurological): Arthropathic arthralgia  Palpation: No palpable anomalies  Palpation: No palpable anomalies   Assessment  Primary Diagnosis & Pertinent Problem List: The primary encounter diagnosis was Spondylosis without myelopathy or radiculopathy, lumbar region. Diagnoses of Lumbar facet syndrome (Bilateral) (L>R), Chronic low back pain  without sciatica, Spasm of back muscles, Chronic musculoskeletal pain, Chronic pain syndrome, Neuropathic pain, and Neurogenic pain were also pertinent to this visit.  Status Diagnosis  Controlled Controlled Controlled 1. Spondylosis without myelopathy or radiculopathy, lumbar region   2. Lumbar facet syndrome  (Bilateral) (L>R)   3. Chronic low back pain  without sciatica   4. Spasm of back muscles   5. Chronic musculoskeletal pain   6. Chronic pain syndrome   7. Neuropathic pain   8. Neurogenic pain     Problems updated and reviewed during this visit: Problem  Chronic low back pain (Primary Area of Pain) (Bilateral) (L>R) w/o sciatica   Time Note: Greater than 50% of the 40 minute(s) of face-to-face time spent with James House, was spent in counseling/coordination of care regarding: the appropriate use of the pain scale, the treatment plan, the risks and possible complications of proposed treatment, the opioid analgesic risks and possible complications, the appropriate use of his medications, the medication agreement, the patient's responsibilities when it comes to controlled substances and the need to collect and read the AVS material.  Plan of Care  Pharmacotherapy (Medications Ordered): Meds ordered this encounter  Medications  . oxyCODONE (OXY IR/ROXICODONE) 5 MG immediate release tablet    Sig: Take 0.5 tablets (2.5 mg total) by mouth 2 (two) times daily.    Dispense:  30 tablet    Refill:  0    Do not place this medication, or any other prescription from our practice, on "Automatic Refill". Patient may have prescription filled one day early if pharmacy is closed on scheduled refill date. Do not fill until: 01/26/18. To last until: 02/25/18  . gabapentin (NEURONTIN) 600 MG tablet    Sig: Take 1 tablet (600 mg total) by mouth 3 (three) times daily.    Dispense:  90 tablet    Refill:  0    Do not place medication on "Automatic Refill". Fill one day early if pharmacy is closed on scheduled refill date.  . pregabalin (LYRICA) 150 MG capsule    Sig: Take 1 capsule (150 mg total) by mouth 2 (two) times daily.    Dispense:  60 capsule    Refill:  0    Do not place medication on "Automatic Refill". Fill one day early if pharmacy is closed on scheduled refill date.  . baclofen (LIORESAL) 10 MG  tablet    Sig: Take 1 tablet (10 mg total) by mouth 3 (three) times daily.    Dispense:  90 tablet    Refill:  0    Do not place this medication, or any other prescription from our practice, on "Automatic Refill". Patient may have prescription filled one day early if pharmacy is closed on scheduled refill date.   Medications administered today: [name] had no medications administered during this visit.   Procedure Orders     LUMBAR FACET(MEDIAL BRANCH NERVE BLOCK) MBNB Lab Orders  No laboratory test(s) ordered today   Imaging Orders  No imaging studies ordered today    Referral Orders     Ambulatory referral to Physical Therapy Interventional management options: Planned, scheduled, and/or pending:    Diagnostic bilateral lumbar facet block #1 under fluoroscopic guidance and IV sedation  PT referral.    Considering:   Diagnosticbilateral LESI  Diagnosticbilateral lumbar facet block  Possible bilateral lumbar facet RFA  Diagnostic bilateral sacroiliac joint block  Possible bilateral sacroiliac joint RFA  Diagnostic right knee Hyalgan series  Diagnosticright knee intra-articular knee injection  Diagnostic left knee genicular nerve block    PRN Procedures:   None at this time   Provider-requested follow-up: Return for Procedure (w/ sedation): (B) L-FCT BLK #1.  Future Appointments  Date Time Provider [place]  01/29/2018  8:30 AM McGowan, Larene Beach A, PA-C [facility] None  02/12/2018 10:30 AM [facility] None  02/12/2018 10:45 AM Sindy Gu[place], MD [facility] None  02/12/2018 11:00 AM [facility] INJECTION [facility] None   Primary Care Physician: Pleas Koch, NP Location: [facility] Note by: Gaspar Cola, MD Date: 01/26/2018; Time: 10:48 AM

## 2018-01-26 ENCOUNTER — Encounter: Payer: Self-pay | Admitting: Pain Medicine

## 2018-01-26 ENCOUNTER — Other Ambulatory Visit: Payer: Self-pay | Admitting: Oncology

## 2018-01-26 ENCOUNTER — Ambulatory Visit: Payer: No Typology Code available for payment source | Attending: Pain Medicine | Admitting: Pain Medicine

## 2018-01-26 ENCOUNTER — Other Ambulatory Visit: Payer: Self-pay

## 2018-01-26 VITALS — BP 140/91 | HR 75 | Temp 98.4°F | Resp 16 | Ht 67.0 in | Wt 155.0 lb

## 2018-01-26 DIAGNOSIS — M545 Low back pain, unspecified: Secondary | ICD-10-CM | POA: Insufficient documentation

## 2018-01-26 DIAGNOSIS — G894 Chronic pain syndrome: Secondary | ICD-10-CM | POA: Insufficient documentation

## 2018-01-26 DIAGNOSIS — Z87891 Personal history of nicotine dependence: Secondary | ICD-10-CM | POA: Insufficient documentation

## 2018-01-26 DIAGNOSIS — M62830 Muscle spasm of back: Secondary | ICD-10-CM | POA: Insufficient documentation

## 2018-01-26 DIAGNOSIS — Z7982 Long term (current) use of aspirin: Secondary | ICD-10-CM | POA: Insufficient documentation

## 2018-01-26 DIAGNOSIS — M47816 Spondylosis without myelopathy or radiculopathy, lumbar region: Secondary | ICD-10-CM | POA: Insufficient documentation

## 2018-01-26 DIAGNOSIS — M5416 Radiculopathy, lumbar region: Secondary | ICD-10-CM | POA: Insufficient documentation

## 2018-01-26 DIAGNOSIS — Z79891 Long term (current) use of opiate analgesic: Secondary | ICD-10-CM | POA: Diagnosis not present

## 2018-01-26 DIAGNOSIS — I129 Hypertensive chronic kidney disease with stage 1 through stage 4 chronic kidney disease, or unspecified chronic kidney disease: Secondary | ICD-10-CM | POA: Diagnosis not present

## 2018-01-26 DIAGNOSIS — G8929 Other chronic pain: Secondary | ICD-10-CM

## 2018-01-26 DIAGNOSIS — Z79899 Other long term (current) drug therapy: Secondary | ICD-10-CM | POA: Insufficient documentation

## 2018-01-26 DIAGNOSIS — N183 Chronic kidney disease, stage 3 (moderate): Secondary | ICD-10-CM | POA: Diagnosis not present

## 2018-01-26 DIAGNOSIS — C9001 Multiple myeloma in remission: Secondary | ICD-10-CM

## 2018-01-26 DIAGNOSIS — M7918 Myalgia, other site: Secondary | ICD-10-CM | POA: Diagnosis not present

## 2018-01-26 DIAGNOSIS — M792 Neuralgia and neuritis, unspecified: Secondary | ICD-10-CM

## 2018-01-26 MED ORDER — BACLOFEN 10 MG PO TABS: 10.0000 mg | ORAL_TABLET | Freq: Three times a day (TID) | ORAL | 0 refills | Status: DC

## 2018-01-26 MED ORDER — OXYCODONE HCL 5 MG PO TABS: 2.5000 mg | ORAL_TABLET | Freq: Two times a day (BID) | ORAL | 0 refills | Status: DC

## 2018-01-26 MED ORDER — PREGABALIN 150 MG PO CAPS: 150.0000 mg | ORAL_CAPSULE | Freq: Two times a day (BID) | ORAL | 0 refills | Status: DC

## 2018-01-26 MED ORDER — GABAPENTIN 600 MG PO TABS: 600.0000 mg | ORAL_TABLET | Freq: Three times a day (TID) | ORAL | 0 refills | Status: DC

## 2018-01-26 NOTE — Patient Instructions (Addendum)
____________________________________________________________________________________________  Pain Scale  Introduction: The pain score used by this practice is the Verbal Numerical Rating Scale (VNRS-11). This is an 11-point scale. It is for adults and children 10 years or older. There are significant differences in how the pain score is reported, used, and applied. Forget everything you learned in the past and learn this scoring system.  General Information: The scale should reflect your current level of pain. Unless you are specifically asked for the level of your worst pain, or your average pain. If you are asked for one of these two, then it should be understood that it is over the past 24 hours.  Basic Activities of Daily Living (ADL): Personal hygiene, dressing, eating, transferring, and using restroom.  Instructions: Most patients tend to report their level of pain as a combination of two factors, their physical pain and their psychosocial pain. This last one is also known as "suffering" and it is reflection of how physical pain affects you socially and psychologically. From now on, report them separately. From this point on, when asked to report your pain level, report only your physical pain. Use the following table for reference.  Pain Clinic Pain Levels (0-5/10)  Pain Level Score  Description  No Pain 0   Mild pain 1 Nagging, annoying, but does not interfere with basic activities of daily living (ADL). Patients are able to eat, bathe, get dressed, toileting (being able to get on and off the toilet and perform personal hygiene functions), transfer (move in and out of bed or a chair without assistance), and maintain continence (able to control bladder and bowel functions). Blood pressure and heart rate are unaffected. A normal heart rate for a healthy adult ranges from 60 to 100 bpm (beats per minute).   Mild to moderate pain 2 Noticeable and distracting. Impossible to hide from other  people. More frequent flare-ups. Still possible to adapt and function close to normal. It can be very annoying and may have occasional stronger flare-ups. With discipline, patients may get used to it and adapt.   Moderate pain 3 Interferes significantly with activities of daily living (ADL). It becomes difficult to feed, bathe, get dressed, get on and off the toilet or to perform personal hygiene functions. Difficult to get in and out of bed or a chair without assistance. Very distracting. With effort, it can be ignored when deeply involved in activities.   Moderately severe pain 4 Impossible to ignore for more than a few minutes. With effort, patients may still be able to manage work or participate in some social activities. Very difficult to concentrate. Signs of autonomic nervous system discharge are evident: dilated pupils (mydriasis); mild sweating (diaphoresis); sleep interference. Heart rate becomes elevated (>115 bpm). Diastolic blood pressure (lower number) rises above 100 mmHg. Patients find relief in laying down and not moving.   Severe pain 5 Intense and extremely unpleasant. Associated with frowning face and frequent crying. Pain overwhelms the senses.  Ability to do any activity or maintain social relationships becomes significantly limited. Conversation becomes difficult. Pacing back and forth is common, as getting into a comfortable position is nearly impossible. Pain wakes you up from deep sleep. Physical signs will be obvious: pupillary dilation; increased sweating; goosebumps; brisk reflexes; cold, clammy hands and feet; nausea, vomiting or dry heaves; loss of appetite; significant sleep disturbance with inability to fall asleep or to remain asleep. When persistent, significant weight loss is observed due to the complete loss of appetite and sleep deprivation.  Blood   pressure and heart rate becomes significantly elevated. Caution: If elevated blood pressure triggers a pounding headache  associated with blurred vision, then the patient should immediately seek attention at an urgent or emergency care unit, as these may be signs of an impending stroke.    Emergency Department Pain Levels (6-10/10)  Emergency Room Pain 6 Severely limiting. Requires emergency care and should not be seen or managed at an outpatient pain management facility. Communication becomes difficult and requires great effort. Assistance to reach the emergency department may be required. Facial flushing and profuse sweating along with potentially dangerous increases in heart rate and blood pressure will be evident.   Distressing pain 7 Self-care is very difficult. Assistance is required to transport, or use restroom. Assistance to reach the emergency department will be required. Tasks requiring coordination, such as bathing and getting dressed become very difficult.   Disabling pain 8 Self-care is no longer possible. At this level, pain is disabling. The individual is unable to do even the most "basic" activities such as walking, eating, bathing, dressing, transferring to a bed, or toileting. Fine motor skills are lost. It is difficult to think clearly.   Incapacitating pain 9 Pain becomes incapacitating. Thought processing is no longer possible. Difficult to remember your own name. Control of movement and coordination are lost.   The worst pain imaginable 10 At this level, most patients pass out from pain. When this level is reached, collapse of the autonomic nervous system occurs, leading to a sudden drop in blood pressure and heart rate. This in turn results in a temporary and dramatic drop in blood flow to the brain, leading to a loss of consciousness. Fainting is one of the body's self defense mechanisms. Passing out puts the brain in a calmed state and causes it to shut down for a while, in order to begin the healing process.    Summary: 1. Refer to this scale when providing [place] with your pain level. 2. Be  accurate and careful when reporting your pain level. This will help with your care. 3. Over-reporting your pain level will lead to loss of credibility. 4. Even a level of 1/10 means that there is pain and will be treated at our facility. 5. High, inaccurate reporting will be documented as "Symptom Exaggeration", leading to loss of credibility and suspicions of possible secondary gains such as obtaining more narcotics, or wanting to appear disabled, for fraudulent reasons. 6. Only pain levels of 5 or below will be seen at our facility. 7. Pain levels of 6 and above will be sent to the Emergency Department and the appointment cancelled. ____________________________________________________________________________________________   ____________________________________________________________________________________________  Muscle Spasms & Cramps  Cause:  The most common cause of muscle spasms and cramps is vitamin and/or electrolyte (calcium, potassium, sodium, etc.) deficiencies.  Possible triggers: Sweating - causes loss of electrolytes thru the skin. Steroids - causes loss of electrolytes thru the urine.  Treatment: 1. Gatorade (or any other electrolyte-replenishing drink) - Take 1, 8 oz glass with each meal (3 times a day). 2. OTC (over-the-counter) Magnesium 400 to 500 mg - Take 1 tablet twice a day (one with breakfast and one before bedtime). If you have kidney problems, talk to your primary care physician before taking any Magnesium. 3. Tonic Water with quinine - Take 1, 8 oz glass before bedtime.   ____________________________________________________________________________________________   ____________________________________________________________________________________________  Medication Rules  Applies to: All patients receiving prescriptions (written or electronic).  Pharmacy of record: Pharmacy where electronic prescriptions will be  sent. If written prescriptions are  taken to a different pharmacy, please inform the nursing staff. The pharmacy listed in the electronic medical record should be the one where you would like electronic prescriptions to be sent.  Prescription refills: Only during scheduled appointments. Applies to both, written and electronic prescriptions.  NOTE: The following applies primarily to controlled substances (Opioid* Pain Medications).   Patient's responsibilities: 1. Pain Pills: Bring all pain pills to every appointment (except for procedure appointments). 2. Pill Bottles: Bring pills in original pharmacy bottle. Always bring newest bottle. Bring bottle, even if empty. 3. Medication refills: You are responsible for knowing and keeping track of what medications you need refilled. The day before your appointment, write a list of all prescriptions that need to be refilled. Bring that list to your appointment and give it to the admitting nurse. Prescriptions will be written only during appointments. If you forget a medication, it will not be "Called in", "Faxed", or "electronically sent". You will need to get another appointment to get these prescribed. 4. Prescription Accuracy: You are responsible for carefully inspecting your prescriptions before leaving our office. Have the discharge nurse carefully go over each prescription with you, before taking them home. Make sure that your name is accurately spelled, that your address is correct. Check the name and dose of your medication to make sure it is accurate. Check the number of pills, and the written instructions to make sure they are clear and accurate. Make sure that you are given enough medication to last until your next medication refill appointment. 5. Taking Medication: Take medication as prescribed. Never take more pills than instructed. Never take medication more frequently than prescribed. Taking less pills or less frequently is permitted and encouraged, when it comes to controlled  substances (written prescriptions).  6. Inform other Doctors: Always inform, all of your healthcare providers, of all the medications you take. 7. Pain Medication from other Providers: You are not allowed to accept any additional pain medication from any other Doctor or Healthcare provider. There are two exceptions to this rule. (see below) In the event that you require additional pain medication, you are responsible for notifying us, as stated below. 8. Medication Agreement: You are responsible for carefully reading and following our Medication Agreement. This must be signed before receiving any prescriptions from our practice. Safely store a copy of your signed Agreement. Violations to the Agreement will result in no further prescriptions. (Additional copies of our Medication Agreement are available upon request.) 9. Laws, Rules, & Regulations: All patients are expected to follow all Federal and [employer], [employer], Rules, [employer]. Ignorance of the Laws does not constitute a valid excuse. The use of any illegal substances is prohibited. 10. Adopted CDC guidelines & recommendations: Target dosing levels will be at or below 60 MME/day. Use of benzodiazepines** is not recommended.  Exceptions: There are only two exceptions to the rule of not receiving pain medications from other Healthcare Providers. 1. Exception #1 (Emergencies): In the event of an emergency (i.e.: accident requiring emergency care), you are allowed to receive additional pain medication. However, you are responsible for: As soon as you are able, call our office [phone], at any time of the day or night, and leave a message stating your name, the date and nature of the emergency, and the name and dose of the medication prescribed. In the event that your call is answered by a member of our staff, make sure to document and save the date, time,  and the name of the person that took your information.  2. Exception #2 (Planned  Surgery): In the event that you are scheduled by another doctor or dentist to have any type of surgery or procedure, you are allowed (for a period no longer than 30 days), to receive additional pain medication, for the acute post-op pain. However, in this case, you are responsible for picking up a copy of our "Post-op Pain Management for Surgeons" handout, and giving it to your surgeon or dentist. This document is available at our office, and does not require an appointment to obtain it. Simply go to our office during business hours (Monday-Thursday from 8:00 AM to 4:00 PM) (Friday 8:00 AM to 12:00 Noon) or if you have a scheduled appointment with [place], prior to your surgery, and ask for it by name. In addition, you will need to provide [place] with your name, name of your surgeon, type of surgery, and date of procedure or surgery.  *Opioid medications include: morphine, codeine, oxycodone, oxymorphone, hydrocodone, hydromorphone, meperidine, tramadol, tapentadol, buprenorphine, fentanyl, methadone. **Benzodiazepine medications include: diazepam (Valium), alprazolam (Xanax), clonazepam (Klonopine), lorazepam (Ativan), clorazepate (Tranxene), chlordiazepoxide (Librium), estazolam (Prosom), oxazepam (Serax), temazepam (Restoril), triazolam (Halcion) (Last updated: 06/19/2017) ____________________________________________________________________________________________   ____________________________________________________________________________________________  Medication Recommendations and Reminders  Applies to: All patients receiving prescriptions (written and/or electronic).  Medication Rules & Regulations: These rules and regulations exist for your safety and that of others. They are not flexible and neither are we. Dismissing or ignoring them will be considered "non-compliance" with medication therapy, resulting in complete and irreversible termination of such therapy. (See document titled "Medication  Rules" for more details.) In all conscience, because of safety reasons, we cannot continue providing a therapy where the patient does not follow instructions.  Pharmacy of record:   Definition: This is the pharmacy where your electronic prescriptions will be sent.   We do not endorse any particular pharmacy.  You are not restricted in your choice of pharmacy.  The pharmacy listed in the electronic medical record should be the one where you want electronic prescriptions to be sent.  If you choose to change pharmacy, simply notify our nursing staff of your choice of new pharmacy.  Recommendations:  Keep all of your pain medications in a safe place, under lock and key, even if you live alone.   After you fill your prescription, take 1 week's worth of pills and put them away in a safe place. You should keep a separate, properly labeled bottle for this purpose. The remainder should be kept in the original bottle. Use this as your primary supply, until it runs out. Once it's gone, then you know that you have 1 week's worth of medicine, and it is time to come in for a prescription refill. If you do this correctly, it is unlikely that you will ever run out of medicine.  To make sure that the above recommendation works, it is very important that you make sure your medication refill appointments are scheduled at least 1 week before you run out of medicine. To do this in an effective manner, make sure that you do not leave the office without scheduling your next medication management appointment. Always ask the nursing staff to show you in your prescription , when your medication will be running out. Then arrange for the receptionist to get you a return appointment, at least 7 days before you run out of medicine. Do not wait until you have 1 or 2 pills left, to come  in. This is very poor planning and does not take into consideration that we may need to cancel appointments due to bad weather, sickness, or  emergencies affecting our staff.  "Partial Fill": If for any reason your pharmacy does not have enough pills/tablets to completely fill or refill your prescription, do not allow for a "partial fill". You will need a separate prescription to fill the remaining amount, which we will not provide. If the reason for the partial fill is your insurance, you will need to talk to the pharmacist about payment alternatives for the remaining tablets, but again, do not accept a partial fill.  Prescription refills and/or changes in medication(s):   Prescription refills, and/or changes in dose or medication, will be conducted only during scheduled medication management appointments. (Applies to both, written and electronic prescriptions.)  No refills on procedure days. No medication will be changed or started on procedure days. No changes, adjustments, and/or refills will be conducted on a procedure day. Doing so will interfere with the diagnostic portion of the procedure.  No phone refills. No medications will be "called into the pharmacy".  No Fax refills.  No weekend refills.  No Holliday refills.  No after hours refills.  Remember:  Business hours are:  Monday to Thursday 8:00 AM to 4:00 PM Provider's Schedule: Dionisio David, NP - Appointments are:  Medication management: Monday to Thursday 8:00 AM to 4:00 PM Milinda Pointer, MD - Appointments are:  Medication management: Monday and Wednesday 8:00 AM to 4:00 PM Procedure day: Tuesday and Thursday 7:30 AM to 4:00 PM Gillis Santa, MD - Appointments are:  Medication management: Tuesday and Thursday 8:00 AM to 4:00 PM Procedure day: Monday and Wednesday 7:30 AM to 4:00 PM (Last update: 06/19/2017) ____________________________________________________________________________________________   ____________________________________________________________________________________________  CANNABIDIOL (AKA: CBD Oil or Pills)  Applies to: All  patients receiving prescriptions of controlled substances (written and/or electronic).  General Information: Cannabidiol (CBD) was discovered in 36. It is one of some 113 identified cannabinoids in cannabis (Marijuana) plants, accounting for up to 40% of the plant's extract. As of 2018, preliminary clinical research on cannabidiol included studies of anxiety, cognition, movement disorders, and pain.  Cannabidiol is consummed in multiple ways, including inhalation of cannabis smoke or vapor, as an aerosol spray into the cheek, and by mouth. It may be supplied as CBD oil containing CBD as the active ingredient (no added tetrahydrocannabinol (THC) or terpenes), a full-plant CBD-dominant hemp extract oil, capsules, dried cannabis, or as a liquid solution. CBD is thought not have the same psychoactivity as THC, and may affect the actions of THC. Studies suggest that CBD may interact with different biological targets, including cannabinoid receptors and other neurotransmitter receptors. As of 2018 the mechanism of action for its biological effects has not been determined.  In the [place], cannabidiol has a limited approval by the Food and Drug Administration (FDA) for treatment of only two types of epilepsy disorders. The side effects of long-term use of the drug include somnolence, decreased appetite, diarrhea, fatigue, malaise, weakness, sleeping problems, and others.  CBD remains a Schedule I drug prohibited for any use.  Legality: Some manufacturers ship CBD products nationally, an illegal action which the FDA has not enforced in 2018, with CBD remaining the subject of an FDA investigational new drug evaluation, and is not considered legal as a dietary supplement or food ingredient as of December 2018. Federal illegality has made it difficult historically to conduct research on CBD. CBD is openly sold in head  shops and health food stores in some states where such sales have not been explicitly  legalized.  Warning: Because it is not FDA approved for general use or treatment of pain, it is not required to undergo the same manufacturing controls as prescription drugs.  This means that the available cannabidiol (CBD) may be contaminated with THC.  If this is the case, it will trigger a positive urine drug screen (UDS) test for cannabinoids (Marijuana).  Because a positive UDS for illicit substances is a violation of our medication agreement, your opioid analgesics (pain medicine) may be permanently discontinued. (Last update: 07/10/2017) ____________________________________________________________________________________________   ____________________________________________________________________________________________  Preparing for Procedure with Sedation  Instructions: . Oral Intake: Do not eat or drink anything for at least 8 hours prior to your procedure. . Transportation: Public transportation is not allowed. Bring an adult driver. The driver must be physically present in our waiting room before any procedure can be started. Marland Kitchen Physical Assistance: Bring an adult physically capable of assisting you, in the event you need help. This adult should keep you company at home for at least 6 hours after the procedure. . Blood Pressure Medicine: Take your blood pressure medicine with a sip of water the morning of the procedure. . Blood thinners: Notify our staff if you are taking any blood thinners. Depending on which one you take, there will be specific instructions on how and when to stop it. . Diabetics on insulin: Notify the staff so that you can be scheduled 1st case in the morning. If your diabetes requires high dose insulin, take only  of your normal insulin dose the morning of the procedure and notify the staff that you have done so. . Preventing infections: Shower with an antibacterial soap the morning of your procedure. . Build-up your immune system: Take 1000 mg of Vitamin C with  every meal (3 times a day) the day prior to your procedure. Marland Kitchen Antibiotics: Inform the staff if you have a condition or reason that requires you to take antibiotics before dental procedures. . Pregnancy: If you are pregnant, call and cancel the procedure. . Sickness: If you have a cold, fever, or any active infections, call and cancel the procedure. . Arrival: You must be in the facility at least 30 minutes prior to your scheduled procedure. . Children: Do not bring children with you. . Dress appropriately: Bring dark clothing that you would not mind if they get stained. . Valuables: Do not bring any jewelry or valuables.  Procedure appointments are reserved for interventional treatments only. Marland Kitchen No Prescription Refills. . No medication changes will be discussed during procedure appointments. . No disability issues will be discussed.  Reasons to call and reschedule or cancel your procedure: (Following these recommendations will minimize the risk of a serious complication.) . Surgeries: Avoid having procedures within 2 weeks of any surgery. (Avoid for 2 weeks before or after any surgery). . Flu Shots: Avoid having procedures within 2 weeks of a flu shots or . (Avoid for 2 weeks before or after immunizations). . Barium: Avoid having a procedure within 7-10 days after having had a radiological study involving the use of radiological contrast. (Myelograms, Barium swallow or enema study). . Heart attacks: Avoid any elective procedures or surgeries for the initial 6 months after a "Myocardial Infarction" (Heart Attack). . Blood thinners: It is imperative that you stop these medications before procedures. Let us know if you if you take any blood thinner.  . Infection: Avoid procedures during or within  two weeks of an infection (including chest colds or gastrointestinal problems). Symptoms associated with infections include: Localized redness, fever, chills, night sweats or profuse sweating, burning  sensation when voiding, cough, congestion, stuffiness, runny nose, sore throat, diarrhea, nausea, vomiting, cold or Flu symptoms, recent or current infections. It is specially important if the infection is over the area that we intend to treat. Marland Kitchen Heart and lung problems: Symptoms that may suggest an active cardiopulmonary problem include: cough, chest pain, breathing difficulties or shortness of breath, dizziness, ankle swelling, uncontrolled high or unusually low blood pressure, and/or palpitations. If you are experiencing any of these symptoms, cancel your procedure and contact your primary care physician for an evaluation.  Remember:  Regular Business hours are:  Monday to Thursday 8:00 AM to 4:00 PM  Provider's Schedule: Milinda Pointer, MD:  Procedure days: Tuesday and Thursday 7:30 AM to 4:00 PM  Gillis Santa, MD:  Procedure days: Monday and Wednesday 7:30 AM to 4:00 PM ____________________________________________________________________________________________  Facet Blocks Patient Information  Description: The facets are joints in the spine between the vertebrae.  Like any joints in the body, facets can become irritated and painful.  Arthritis can also effect the facets.  By injecting steroids and local anesthetic in and around these joints, we can temporarily block the nerve supply to them.  Steroids act directly on irritated nerves and tissues to reduce selling and inflammation which often leads to decreased pain.  Facet blocks may be done anywhere along the spine from the neck to the low back depending upon the location of your pain.   After numbing the skin with local anesthetic (like Novocaine), a small needle is passed onto the facet joints under x-ray guidance.  You may experience a sensation of pressure while this is being done.  The entire block usually lasts about 15-25 minutes.   Conditions which may be treated by facet blocks:   Low back/buttock pain  Neck/shoulder  pain  Certain types of headaches  Preparation for the injection:  1. Do not eat any solid food or dairy products within 8 hours of your appointment. 2. You may drink clear liquid up to 3 hours before appointment.  Clear liquids include water, black coffee, juice or soda.  No milk or cream please. 3. You may take your regular medication, including pain medications, with a sip of water before your appointment.  Diabetics should hold regular insulin (if taken separately) and take 1/2 normal NPH dose the morning of the procedure.  Carry some sugar containing items with you to your appointment. 4. A driver must accompany you and be prepared to drive you home after your procedure. 5. Bring all your current medications with you. 6. An IV may be inserted and sedation may be given at the discretion of the physician. 7. A blood pressure cuff, EKG and other monitors will often be applied during the procedure.  Some patients may need to have extra oxygen administered for a short period. 8. You will be asked to provide medical information, including your allergies and medications, prior to the procedure.  We must know immediately if you are taking blood thinners (like Coumadin/Warfarin) or if you are allergic to IV iodine contrast (dye).  We must know if you could possible be pregnant.  Possible side-effects:   Bleeding from needle site  Infection (rare, may require surgery)  Nerve injury (rare)  Numbness & tingling (temporary)  Difficulty urinating (rare, temporary)  Spinal headache (a headache worse with upright posture)  Light-headedness (  temporary)  Pain at injection site (serveral days)  Decreased blood pressure (rare, temporary)  Weakness in arm/leg (temporary)  Pressure sensation in back/neck (temporary)   Call if you experience:   Fever/chills associated with headache or increased back/neck pain  Headache worsened by an upright position  New onset, weakness or numbness of  an extremity below the injection site  Hives or difficulty breathing (go to the emergency room)  Inflammation or drainage at the injection site(s)  Severe back/neck pain greater than usual  New symptoms which are concerning to you  Please note:  Although the local anesthetic injected can often make your back or neck feel good for several hours after the injection, the pain will likely return. It takes 3-7 days for steroids to work.  You may not notice any pain relief for at least one week.  If effective, we will often do a series of 2-3 injections spaced 3-6 weeks apart to maximally decrease your pain.  After the initial series, you may be a candidate for a more permanent nerve block of the facets.  If you have any questions, please call #[phone]) [facility]  ____________________________________________________________________________________________  General Risks and Possible Complications  Patient Responsibilities: It is important that you read this as it is part of your informed consent. It is our duty to inform you of the risks and possible complications associated with treatments offered to you. It is your responsibility as a patient to read this and to ask questions about anything that is not clear or that you believe was not covered in this document.  Patient's Rights: You have the right to refuse treatment. You also have the right to change your mind, even after initially having agreed to have the treatment done. However, under this last option, if you wait until the last second to change your mind, you may be charged for the materials used up to that point.  Introduction: Medicine is not an Chief Strategy Officer. Everything in Medicine, including the lack of treatment(s), carries the potential for danger, harm, or loss (which is by definition: Risk). In Medicine, a complication is a secondary problem, condition, or disease that can aggravate an  already existing one. All treatments carry the risk of possible complications. The fact that a side effects or complications occurs, does not imply that the treatment was conducted incorrectly. It must be clearly understood that these can happen even when everything is done following the highest safety standards.  No treatment: You can choose not to proceed with the proposed treatment alternative. The "PRO(s)" would include: avoiding the risk of complications associated with the therapy. The "CON(s)" would include: not getting any of the treatment benefits. These benefits fall under one of three categories: diagnostic; therapeutic; and/or palliative. Diagnostic benefits include: getting information which can ultimately lead to improvement of the disease or symptom(s). Therapeutic benefits are those associated with the successful treatment of the disease. Finally, palliative benefits are those related to the decrease of the primary symptoms, without necessarily curing the condition (example: decreasing the pain from a flare-up of a chronic condition, such as incurable terminal cancer).  General Risks and Complications: These are associated to most interventional treatments. They can occur alone, or in combination. They fall under one of the following six (6) categories: no benefit or worsening of symptoms; bleeding; infection; nerve damage; allergic reactions; and/or death. 1. No benefits or worsening of symptoms: In Medicine there are no guarantees, only probabilities. No healthcare provider can  ever guarantee that a medical treatment will work, they can only state the probability that it may. Furthermore, there is always the possibility that the condition may worsen, either directly, or indirectly, as a consequence of the treatment. 2. Bleeding: This is more common if the patient is taking a blood thinner, either prescription or over the counter (example: Goody Powders, Fish oil, Aspirin, Garlic, etc.), or if  suffering a condition associated with impaired coagulation (example: Hemophilia, cirrhosis of the liver, low platelet counts, etc.). However, even if you do not have one on these, it can still happen. If you have any of these conditions, or take one of these drugs, make sure to notify your treating physician. 3. Infection: This is more common in patients with a compromised immune system, either due to disease (example: diabetes, cancer, human immunodeficiency virus [HIV], etc.), or due to medications or treatments (example: therapies used to treat cancer and rheumatological diseases). However, even if you do not have one on these, it can still happen. If you have any of these conditions, or take one of these drugs, make sure to notify your treating physician. 4. Nerve Damage: This is more common when the treatment is an invasive one, but it can also happen with the use of medications, such as those used in the treatment of cancer. The damage can occur to small secondary nerves, or to large primary ones, such as those in the spinal cord and brain. This damage may be temporary or permanent and it may lead to impairments that can range from temporary numbness to permanent paralysis and/or brain death. 5. Allergic Reactions: Any time a substance or material comes in contact with our body, there is the possibility of an allergic reaction. These can range from a mild skin rash (contact dermatitis) to a severe systemic reaction (anaphylactic reaction), which can result in death. 6. Death: In general, any medical intervention can result in death, most of the time due to an unforeseen complication. ____________________________________________________________________________________________  Baclofen, gabapentin, oxycododone 5 mg, lyrica Rx's escribed to pharmacy

## 2018-01-26 NOTE — Progress Notes (Signed)
Nursing Pain Medication Assessment:  Safety precautions to be maintained throughout the outpatient stay will include: orient to surroundings, keep bed in low position, maintain call bell within reach at all times, provide assistance with transfer out of bed and ambulation.  Medication Inspection Compliance: Pill count conducted under aseptic conditions, in front of the patient. Neither the pills nor the bottle was removed from the patient's sight at any time. Once count was completed pills were immediately returned to the patient in their original bottle.  Medication: Oxycodone IR Pill/Patch Count: 0 of 30 pills remain Pill/Patch Appearance: Markings consistent with prescribed medication Bottle Appearance: Standard pharmacy container. Clearly labeled. Filled Date: 09 / 04 / 2019 Last Medication intake:  Yesterday

## 2018-01-28 ENCOUNTER — Telehealth: Payer: Self-pay | Admitting: *Deleted

## 2018-01-28 NOTE — Telephone Encounter (Signed)
Mr Younge called and states that Dr Bernardo Heater was to contact Dr Janese Banks to see if patient can use Edex injections for ED. Per patient it is contrarindicated in patients with MM. He is supposed to pick up medicine today and see Ardelle Anton in AM to learn to do injections. Asking if he should postpone this since he hasn't heard if Dr Janese Banks is alright with him getting Edex. I advised that he postpone appointment and picking up medicine as Dr Janese Banks is out of the office today and there is no documentation that Dr Bernardo Heater and she has discussed this. Patient in agreement to postpone appointment.  Dr Janese Banks, have you heard anything about this and can the patient proceed or should he try an alternative treatment? Please advise

## 2018-01-29 ENCOUNTER — Ambulatory Visit: Payer: No Typology Code available for payment source | Admitting: Urology

## 2018-01-29 NOTE — Telephone Encounter (Signed)
I had called pt and let him know that Dr. Janese Banks needs to look into the Edex a little longer because when she looked it up it can cause priaprism specifically with myeloma.  Later I called back and told pt that she has done more research and his myeloma is in remission and so therefore it does not increase his percentage of having priaprism any more that any other person taking it. He is aware and will let md know that he can move forward with gettingmed

## 2018-01-30 ENCOUNTER — Telehealth: Payer: Self-pay | Admitting: *Deleted

## 2018-01-30 NOTE — Telephone Encounter (Signed)
Pt called stating that he called yest. To get the class for the education on edex and was told that Dr. Bernardo Heater was out of town and they do not have approval for the med from Dr. Janese Banks and they looked at the note from computer.  I told him that I did put that note in stating we need to investigate further and then when I got the answer then I added to the note and it should cover him to get the drug.  I told him I will call the urology dept. I did call and spoek to [place] and she said to attach the note that says he is good to get the med to [facility] and then after Larene Beach sees the note and will send theinfo to schedule pt.

## 2018-02-02 ENCOUNTER — Ambulatory Visit: Payer: No Typology Code available for payment source | Admitting: Pain Medicine

## 2018-02-04 ENCOUNTER — Telehealth: Payer: Self-pay | Admitting: *Deleted

## 2018-02-04 NOTE — Telephone Encounter (Signed)
Pt called to say that he did not go to [facility] yest. He did not know if his new insurance focus planwould pay for it. It was to get his immunizations and he would like to ask if we can give immunizations here. I told him that I would ask rao tom. And let him know. He is agreeable to plan

## 2018-02-05 ENCOUNTER — Other Ambulatory Visit: Payer: Self-pay | Admitting: Urology

## 2018-02-05 ENCOUNTER — Telehealth: Payer: Self-pay | Admitting: *Deleted

## 2018-02-05 NOTE — Progress Notes (Signed)
error 

## 2018-02-05 NOTE — Telephone Encounter (Signed)
Patient to let them know that I have called the doctor's office for Dr. Edison Pace and got sent to Lamarr Lulas the nurse navigator for myeloma transplants.  I left a message on her voicemail to give me a call back to give me a vaccination list of what vaccinations need to be done after transplant .  I have left my phone and and fax number and asked them to fax it to me the [place].

## 2018-02-10 ENCOUNTER — Ambulatory Visit: Payer: No Typology Code available for payment source | Admitting: Physical Therapy

## 2018-02-10 ENCOUNTER — Other Ambulatory Visit: Payer: Self-pay

## 2018-02-10 DIAGNOSIS — C9001 Multiple myeloma in remission: Secondary | ICD-10-CM

## 2018-02-12 ENCOUNTER — Other Ambulatory Visit: Payer: Self-pay

## 2018-02-12 ENCOUNTER — Inpatient Hospital Stay: Payer: No Typology Code available for payment source | Attending: Oncology

## 2018-02-12 ENCOUNTER — Inpatient Hospital Stay: Payer: No Typology Code available for payment source

## 2018-02-12 ENCOUNTER — Encounter: Payer: Self-pay | Admitting: Oncology

## 2018-02-12 ENCOUNTER — Ambulatory Visit: Payer: No Typology Code available for payment source | Admitting: Physical Therapy

## 2018-02-12 ENCOUNTER — Inpatient Hospital Stay (HOSPITAL_BASED_OUTPATIENT_CLINIC_OR_DEPARTMENT_OTHER): Payer: No Typology Code available for payment source | Admitting: Oncology

## 2018-02-12 VITALS — BP 133/71 | HR 65 | Temp 98.5°F | Resp 18 | Ht 67.0 in | Wt 156.6 lb

## 2018-02-12 DIAGNOSIS — C9001 Multiple myeloma in remission: Secondary | ICD-10-CM

## 2018-02-12 DIAGNOSIS — Z23 Encounter for immunization: Secondary | ICD-10-CM | POA: Insufficient documentation

## 2018-02-12 DIAGNOSIS — Z9484 Stem cells transplant status: Secondary | ICD-10-CM

## 2018-02-12 DIAGNOSIS — Z79899 Other long term (current) drug therapy: Secondary | ICD-10-CM | POA: Diagnosis not present

## 2018-02-12 DIAGNOSIS — Z87891 Personal history of nicotine dependence: Secondary | ICD-10-CM

## 2018-02-12 DIAGNOSIS — Z7983 Long term (current) use of bisphosphonates: Secondary | ICD-10-CM | POA: Diagnosis not present

## 2018-02-12 DIAGNOSIS — Z299 Encounter for prophylactic measures, unspecified: Secondary | ICD-10-CM

## 2018-02-12 LAB — COMPREHENSIVE METABOLIC PANEL
ALBUMIN: 3.9 g/dL (ref 3.5–5.0)
ALT: 21 U/L (ref 0–44)
ANION GAP: 5 (ref 5–15)
AST: 21 U/L (ref 15–41)
Alkaline Phosphatase: 67 U/L (ref 38–126)
BILIRUBIN TOTAL: 0.7 mg/dL (ref 0.3–1.2)
BUN: 12 mg/dL (ref 6–20)
CHLORIDE: 105 mmol/L (ref 98–111)
CO2: 28 mmol/L (ref 22–32)
Calcium: 8.6 mg/dL — ABNORMAL LOW (ref 8.9–10.3)
Creatinine, Ser: 1.52 mg/dL — ABNORMAL HIGH (ref 0.61–1.24)
GFR calc Af Amer: >60 mL/min (ref >60)
GFR calc non Af Amer: 53 mL/min — ABNORMAL LOW (ref >60)
Glucose, Bld: 87 mg/dL (ref 70–99)
POTASSIUM: 3.8 mmol/L (ref 3.5–5.1)
SODIUM: 138 mmol/L (ref 135–145)
Total Protein: 6.9 g/dL (ref 6.5–8.1)

## 2018-02-12 LAB — CBC WITH DIFFERENTIAL/PLATELET
ABS IMMATURE GRANULOCYTES: 0.03 10*3/uL (ref 0.00–0.07)
BASOS PCT: 4 %
Basophils Absolute: 0.1 10*3/uL (ref 0.0–0.1)
EOS ABS: 0.3 10*3/uL (ref 0.0–0.5)
Eosinophils Relative: 9 %
HCT: 39.3 % (ref 39.0–52.0)
Hemoglobin: 13.2 g/dL (ref 13.0–17.0)
Immature Granulocytes: 1 %
LYMPHS ABS: 0.8 10*3/uL (ref 0.7–4.0)
LYMPHS PCT: 23 %
MCH: 32 pg (ref 26.0–34.0)
MCHC: 33.6 g/dL (ref 30.0–36.0)
MCV: 95.4 fL (ref 80.0–100.0)
MONO ABS: 0.5 10*3/uL (ref 0.1–1.0)
MONOS PCT: 14 %
NEUTROS ABS: 1.7 10*3/uL (ref 1.7–7.7)
NEUTROS PCT: 49 %
PLATELETS: 166 10*3/uL (ref 150–400)
RBC: 4.12 MIL/uL — AB (ref 4.22–5.81)
RDW: 14 % (ref 11.5–15.5)
WBC: 3.4 10*3/uL — ABNORMAL LOW (ref 4.0–10.5)
nRBC: 0 % (ref 0.0–0.2)

## 2018-02-12 MED ORDER — PNEUMOCOCCAL 13-VAL CONJ VACC IM SUSP
0.5000 mL | Freq: Once | INTRAMUSCULAR | Status: DC
  Filled 2018-02-12: qty 0.5

## 2018-02-12 MED ORDER — DENOSUMAB 120 MG/1.7ML ~~LOC~~ SOLN
120.0000 mg | SUBCUTANEOUS | Status: DC
  Administered 2018-02-12: 120 mg via SUBCUTANEOUS

## 2018-02-12 MED ORDER — TETANUS-DIPHTH-ACELL PERTUSSIS 5-2.5-18.5 LF-MCG/0.5 IM SUSP
0.5000 mL | Freq: Once | INTRAMUSCULAR | Status: DC
  Filled 2018-02-12: qty 0.5

## 2018-02-12 NOTE — Progress Notes (Signed)
Patient c/o increase pain noted to bilateral legs and lower back ( pain level 8)

## 2018-02-13 ENCOUNTER — Other Ambulatory Visit: Payer: Self-pay | Admitting: *Deleted

## 2018-02-13 ENCOUNTER — Inpatient Hospital Stay: Payer: No Typology Code available for payment source

## 2018-02-13 ENCOUNTER — Telehealth: Payer: Self-pay | Admitting: *Deleted

## 2018-02-13 DIAGNOSIS — Z299 Encounter for prophylactic measures, unspecified: Secondary | ICD-10-CM

## 2018-02-13 DIAGNOSIS — C9001 Multiple myeloma in remission: Secondary | ICD-10-CM | POA: Diagnosis not present

## 2018-02-13 DIAGNOSIS — Z23 Encounter for immunization: Secondary | ICD-10-CM

## 2018-02-13 LAB — KAPPA/LAMBDA LIGHT CHAINS
Kappa free light chain: 34.6 mg/L — ABNORMAL HIGH (ref 3.3–19.4)
Kappa, lambda light chain ratio: 1.57 (ref 0.26–1.65)
Lambda free light chains: 22.1 mg/L (ref 5.7–26.3)

## 2018-02-13 MED ORDER — INFLUENZA VAC SPLIT QUAD 0.5 ML IM SUSY
0.5000 mL | PREFILLED_SYRINGE | Freq: Once | INTRAMUSCULAR | Status: AC
  Administered 2018-02-13: 0.5 mL via INTRAMUSCULAR
  Filled 2018-02-13: qty 0.5

## 2018-02-13 MED ORDER — TETANUS-DIPHTH-ACELL PERTUSSIS 5-2.5-18.5 LF-MCG/0.5 IM SUSP
0.5000 mL | Freq: Once | INTRAMUSCULAR | Status: AC
  Administered 2018-02-13: 0.5 mL via INTRAMUSCULAR
  Filled 2018-02-13: qty 0.5

## 2018-02-13 MED ORDER — PNEUMOCOCCAL 13-VAL CONJ VACC IM SUSP
0.5000 mL | INTRAMUSCULAR | Status: AC
  Administered 2018-02-13: 0.5 mL via INTRAMUSCULAR
  Filled 2018-02-13: qty 0.5

## 2018-02-13 NOTE — Telephone Encounter (Signed)
Patient called asking if we have found a place for him to get his immunizations yet. Please return his call [phone]

## 2018-02-13 NOTE — Telephone Encounter (Signed)
Called patient to let them know that we can give him the influenza shot the DPT shot and the Prevnar shot today the other parts of his vaccination will have to be given at the [facility].  Patient was given the list of vaccinations that he got today the list of what he needs to get through [place] and the list of what he is been a need in the future.  Patient agreeable to call the health department and get an appointment for the rest of his vaccinations

## 2018-02-13 NOTE — Progress Notes (Signed)
Hematology/Oncology Consult note [facility]  [phone] [phone]  Patient Care Team: Pleas Koch, NP as PCP - General (Internal Medicine) Vella Redhead, MD as PCP - Hematology/Oncology   Name of the patient: James House  [id number]  04-Feb-1971   Date of visit: 02/13/18  Diagnosis- light chain kappa multiple myeloma s/p ASCT currently in remission. Patient on maintenance revlimid   Chief complaint/ Reason for visit-routine follow-up of multiple myeloma  Heme/Onc history: patient is a 47 year old male with a history of kappa light chain myeloma that was treated by [name]. History is as follows:  1. Patient presented with renal insufficiency with a creatinine of 2.4 in April 2018. Kidney biopsy showed myeloma cast nephropathy. Kappa light chain was 3004 and lambda 0.22 with a kappa lambda ratio of 13,000 655. Skeletal survey showed multiple calvarial and marrow lesions. Bone marrow biopsy in May 2018 showed 43% plasma cells. He received CyBorDfor cycle 1 in May 2018 and was subsequently switched to RVD. He received 4 cycles followed by a repeat bone marrow biopsy in August 2018 which showed no increase in plasma cells.  2.He underwent autologous stem cell transplantation on 01/30/2017. He was started on Revlimid maintenance 10 mg daily in January 2019.  3.Labs from January February and March 2019 done at [facility] showed no M spike, normal kappa lambda ratio of 1.06. He was last seen by them in April 2019.  Following that patient was admitted to [facility] for healthcare associated pneumonia and was recently discharged. He wished to transfer his care to [place] at this time.  With regards to his myeloma patient is currently on 10 mg of Revlimid daily. He is also on acyclovir 400 mg twice daily which he is to continue for a minimum of 1 year post transplant. Patient has chronic back pain as well as chemo-induced  peripheral neuropathy for which he was seen by [name] in the past and is currently seeing [facility] pain clinic and is under pain contract with them  Interval history- reports chronic back pain and symptoms of peripheral neuropathy. Denies other complaints. Appetite is good. He denies any unintentional weight loss  ECOG PS- 1 Pain scale- 0 Opioid associated constipation- no  Review of systems- Review of Systems  Constitutional: Negative for chills, fever, malaise/fatigue and weight loss.  HENT: Negative for congestion, ear discharge and nosebleeds.   Eyes: Negative for blurred vision.  Respiratory: Negative for cough, hemoptysis, sputum production, shortness of breath and wheezing.   Cardiovascular: Negative for chest pain, palpitations, orthopnea and claudication.  Gastrointestinal: Negative for abdominal pain, blood in stool, constipation, diarrhea, heartburn, melena, nausea and vomiting.  Genitourinary: Negative for dysuria, flank pain, frequency, hematuria and urgency.  Musculoskeletal: Positive for back pain. Negative for joint pain and myalgias.  Skin: Negative for rash.  Neurological: Negative for dizziness, focal weakness, seizures, weakness and headaches.  Endo/Heme/Allergies: Does not bruise/bleed easily.  Psychiatric/Behavioral: Negative for depression and suicidal ideas. The patient does not have insomnia.        No Known Allergies   Past Medical History:  Diagnosis Date  . CKD (chronic kidney disease) stage 3, GFR 30-59 ml/min (HCC)   . Hypertension   . Multiple myeloma ([place]) 08/11/2017  . Neuropathy   . Pneumonia      Past Surgical History:  Procedure Laterality Date  . ABDOMINAL SURGERY    . JOINT REPLACEMENT     right knee  . KNEE SURGERY Left  Social History   Socioeconomic History  . Marital status: Married    Spouse name: [name]  . Number of children: 3  . Years of education: Not on file  . Highest education level: Not on file    Occupational History  . Not on file  Social Needs  . Financial resource strain: Not hard at all  . Food insecurity:    Worry: Never true    Inability: Never true  . Transportation needs:    Medical: No    Non-medical: No  Tobacco Use  . Smoking status: Former Smoker    Last attempt to quit: 08/19/2013    Years since quitting: 4.4  . Smokeless tobacco: Current User    Types: Snuff  Substance and Sexual Activity  . Alcohol use: No  . Drug use: No  . Sexual activity: Yes  Lifestyle  . Physical activity:    Days per week: 7 days    Minutes per session: 30 min  . Stress: Only a little  Relationships  . Social connections:    Talks on phone: Once a week    Gets together: Once a week    Attends religious service: More than 4 times per year    Active member of club or organization: No    Attends meetings of clubs or organizations: Never    Relationship status: Married  . Intimate partner violence:    Fear of current or ex partner: No    Emotionally abused: No    Physically abused: No    Forced sexual activity: No  Other Topics Concern  . Not on file  Social History Narrative   Live in private residence with spouse and mother in law    Family History  Problem Relation Age of Onset  . Cancer Mother 31       Pancreatic  . COPD Mother   . Diabetes Mother   . Hyperlipidemia Mother   . Hypertension Mother   . Cancer Maternal Uncle        pancreatic  . Cancer Maternal Grandmother 63       colon  . COPD Maternal Grandmother   . Diabetes Maternal Grandmother   . Hypertension Maternal Grandmother      Current Outpatient Medications:  .  acyclovir (ZOVIRAX) 200 MG capsule, Take 200 mg by mouth 2 (two) times daily. , Disp: , Rfl:  .  aspirin EC 81 MG tablet, Take 81 mg by mouth daily., Disp: , Rfl:  .  baclofen (LIORESAL) 10 MG tablet, Take 1 tablet (10 mg total) by mouth 3 (three) times daily., Disp: 90 tablet, Rfl: 0 .  gabapentin (NEURONTIN) 600 MG tablet, Take 1  tablet (600 mg total) by mouth 3 (three) times daily., Disp: 90 tablet, Rfl: 0 .  oxyCODONE (OXY IR/ROXICODONE) 5 MG immediate release tablet, Take 0.5 tablets (2.5 mg total) by mouth 2 (two) times daily., Disp: 30 tablet, Rfl: 0 .  pantoprazole (PROTONIX) 40 MG tablet, Take 40 mg by mouth daily., Disp: , Rfl:  .  pregabalin (LYRICA) 150 MG capsule, Take 1 capsule (150 mg total) by mouth 2 (two) times daily., Disp: 60 capsule, Rfl: 0 .  REVLIMID 5 MG capsule, TAKE 1 CAPSULE (5 MG TOTAL) BY MOUTH DAILY., Disp: 28 capsule, Rfl: 0 .  Vitamin D, Ergocalciferol, (DRISDOL) 50000 units CAPS capsule, Take 50,000 Units by mouth every 7 (seven) days. Takes on Monday, Disp: , Rfl:  .  acetaminophen (TYLENOL) 325 MG tablet, Take 2 tablets (  650 mg total) by mouth every 6 (six) hours as needed for mild pain (or Fever >/= 101). (Patient not taking: Reported on 02/12/2018), Disp: , Rfl:  .  promethazine (PHENERGAN) 25 MG tablet, , Disp: , Rfl: 0  Physical exam:  Vitals:   02/12/18 1047 02/12/18 1050  BP:  133/71  Pulse:  65  Resp:  18  Temp:  98.5 F (36.9 C)  TempSrc:  Tympanic  SpO2: 98% 98%  Weight: 156 lb 9.6 oz (71 kg)   Height: _0  (1.702 m)    Physical Exam  Constitutional: He is oriented to person, place, and time.  Thin young man in no acute distress  HENT:  Head: Normocephalic and atraumatic.  Eyes: Pupils are equal, round, and reactive to light. EOM are normal.  Neck: Normal range of motion.  Cardiovascular: Normal rate, regular rhythm and normal heart sounds.  Pulmonary/Chest: Effort normal and breath sounds normal.  Abdominal: Soft. Bowel sounds are normal.  Neurological: He is alert and oriented to person, place, and time.  Skin: Skin is warm and dry.     CMP Latest Ref Rng & Units 02/12/2018  Glucose 70 - 99 mg/dL 87  BUN 6 - 20 mg/dL 12  Creatinine 0.61 - 1.24 mg/dL 1.52(H)  Sodium 135 - 145 mmol/L 138  Potassium 3.5 - 5.1 mmol/L 3.8  Chloride 98 - 111 mmol/L 105  CO2 22  - 32 mmol/L 28  Calcium 8.9 - 10.3 mg/dL 8.6(L)  Total Protein 6.5 - 8.1 g/dL 6.9  Total Bilirubin 0.3 - 1.2 mg/dL 0.7  Alkaline Phos 38 - 126 U/L 67  AST 15 - 41 U/L 21  ALT 0 - 44 U/L 21   CBC Latest Ref Rng & Units 02/12/2018  WBC 4.0 - 10.5 K/uL 3.4(L)  Hemoglobin 13.0 - 17.0 g/dL 13.2  Hematocrit 39.0 - 52.0 % 39.3  Platelets 150 - 400 K/uL 166      Assessment and plan- Patient is a 47 y.o. male with kappa light chain multiple myeloma status post RVD chemotherapy followed by autologous stem cell transplantation in August 2018 currently in remission and on maintenance Revlimid. He is now 1 year post transplant and here for routine follow-up of his multiple myeloma  1.  His CBC is essentially stable and at current dose of 5 mg p.o. daily.  Kidney functions are stable around 1.5.  Multiple myeloma panel shows no M protein and light chain ratio is normal.  At this time I will obtain myeloma panel and free light chains every 2 months  2.  Patient will continue to get monthly Xgeva.  He will get 1 dose today 1 dose next month and I will see him back in 2 months  3.  Patient now follows up with [facility] pain clinic and they will be managing his pain medicines as well as they are planning interventional procedures for his low back pain  4.  Patient is now 1 year post autologous stem cell transplantation.  He does not wish to go back to [facility] for his immunizations given his insurance issues.  We have obtained the posttransplant immunization schedule and will follow with accordingly.  He will be due for DTaP along with polio and hepatitis B vaccination at this time which we will arrange   Visit Diagnosis 1. Multiple myeloma in remission ([place])   2. High risk medication use   3. Long-term current use of bisphosphonate      Dr. Randa Evens, MD, MPH  [facility] [phone] 02/13/2018 8:22 AM

## 2018-02-16 LAB — MULTIPLE MYELOMA PANEL, SERUM
ALBUMIN/GLOB SERPL: 1.4 (ref 0.7–1.7)
ALPHA 1: 0.2 g/dL (ref 0.0–0.4)
ALPHA2 GLOB SERPL ELPH-MCNC: 0.7 g/dL (ref 0.4–1.0)
Albumin SerPl Elph-Mcnc: 3.6 g/dL (ref 2.9–4.4)
B-Globulin SerPl Elph-Mcnc: 1 g/dL (ref 0.7–1.3)
GAMMA GLOB SERPL ELPH-MCNC: 0.9 g/dL (ref 0.4–1.8)
GLOBULIN, TOTAL: 2.7 g/dL (ref 2.2–3.9)
IGG (IMMUNOGLOBIN G), SERUM: 936 mg/dL (ref 700–1600)
IGM (IMMUNOGLOBULIN M), SRM: 38 mg/dL (ref 20–172)
IgA: 207 mg/dL (ref 90–386)
Total Protein ELP: 6.3 g/dL (ref 6.0–8.5)

## 2018-02-17 ENCOUNTER — Ambulatory Visit: Payer: No Typology Code available for payment source | Admitting: Physical Therapy

## 2018-02-17 ENCOUNTER — Telehealth: Payer: Self-pay

## 2018-02-17 ENCOUNTER — Encounter: Payer: Self-pay | Admitting: Oncology

## 2018-02-17 ENCOUNTER — Encounter: Payer: Self-pay | Admitting: Pain Medicine

## 2018-02-17 NOTE — Telephone Encounter (Signed)
The patient called in and left a message requesting his medical records. All the patient notes was printed and taking down stairs to the regestraion desk. The patient was called and informed of his medical records and has agreed to pick them up tomorrow.

## 2018-02-19 ENCOUNTER — Encounter: Payer: No Typology Code available for payment source | Admitting: Physical Therapy

## 2018-02-19 ENCOUNTER — Telehealth: Payer: Self-pay | Admitting: Urology

## 2018-02-19 NOTE — Telephone Encounter (Signed)
Pt calling to follow up on Edex medication, states he was told not to pick it up until Dr. Bernardo Heater cleared with pt's oncologist. Please advise. Thanks.

## 2018-02-19 NOTE — Telephone Encounter (Signed)
Patient notified and appointment for teaching scheduled

## 2018-02-19 NOTE — Telephone Encounter (Signed)
Has been cleared by his oncologist

## 2018-02-24 ENCOUNTER — Encounter: Payer: No Typology Code available for payment source | Admitting: Physical Therapy

## 2018-02-25 ENCOUNTER — Encounter: Payer: Self-pay | Admitting: Oncology

## 2018-02-25 ENCOUNTER — Other Ambulatory Visit: Payer: Self-pay | Admitting: Oncology

## 2018-02-25 ENCOUNTER — Ambulatory Visit: Payer: Self-pay | Admitting: Urology

## 2018-02-25 DIAGNOSIS — C9001 Multiple myeloma in remission: Secondary | ICD-10-CM

## 2018-02-26 ENCOUNTER — Other Ambulatory Visit: Payer: Self-pay | Admitting: *Deleted

## 2018-02-26 ENCOUNTER — Encounter: Payer: No Typology Code available for payment source | Admitting: Physical Therapy

## 2018-02-26 ENCOUNTER — Ambulatory Visit (INDEPENDENT_AMBULATORY_CARE_PROVIDER_SITE_OTHER): Payer: No Typology Code available for payment source | Admitting: Urology

## 2018-02-26 ENCOUNTER — Encounter: Payer: Self-pay | Admitting: Urology

## 2018-02-26 ENCOUNTER — Encounter: Payer: Self-pay | Admitting: Oncology

## 2018-02-26 VITALS — BP 127/87 | HR 56 | Ht 67.0 in | Wt 159.7 lb

## 2018-02-26 DIAGNOSIS — E291 Testicular hypofunction: Secondary | ICD-10-CM

## 2018-02-26 DIAGNOSIS — R39198 Other difficulties with micturition: Secondary | ICD-10-CM

## 2018-02-26 DIAGNOSIS — N521 Erectile dysfunction due to diseases classified elsewhere: Secondary | ICD-10-CM | POA: Diagnosis not present

## 2018-02-26 DIAGNOSIS — C9001 Multiple myeloma in remission: Secondary | ICD-10-CM

## 2018-02-26 LAB — BLADDER SCAN AMB NON-IMAGING

## 2018-02-26 MED ORDER — LENALIDOMIDE 5 MG PO CAPS: ORAL_CAPSULE | ORAL | 3 refills | Status: DC

## 2018-02-27 ENCOUNTER — Encounter: Payer: Self-pay | Admitting: Urology

## 2018-02-27 ENCOUNTER — Ambulatory Visit: Payer: Self-pay | Admitting: Urology

## 2018-02-27 NOTE — Progress Notes (Signed)
02/26/2018 11:47 AM   [name] 1970-08-02 [id number]  Referring provider: Pleas Koch, NP [place], [place] [address]  Chief Complaint  Patient presents with  . Erectile Dysfunction    HPI: 47 year old male presents today for intracavernosal injection training.  He has a history of multiple myeloma which is not active and it was felt by his oncologist that intracavernosal therapy was not contraindicated.  He also states he urinates only 1-2 times a day.  He wanted to know if this indicated anything abnormal.  He has no dysuria, suprapubic pain or sensation of incomplete emptying   PMH: Past Medical History:  Diagnosis Date  . CKD (chronic kidney disease) stage 3, GFR 30-59 ml/min (HCC)   . Hypertension   . Multiple myeloma ([facility]) 08/11/2017  . Neuropathy   . Pneumonia     Surgical History: Past Surgical History:  Procedure Laterality Date  . ABDOMINAL SURGERY    . JOINT REPLACEMENT     right knee  . KNEE SURGERY Left     Home Medications:  Allergies as of 02/26/2018   No Known Allergies     Medication List        Accurate as of 02/26/18 11:59 PM. Always use your most recent med list.          acetaminophen 325 MG tablet Commonly known as:  TYLENOL Take 2 tablets (650 mg total) by mouth every 6 (six) hours as needed for mild pain (or Fever >/= 101).   acyclovir 200 MG capsule Commonly known as:  ZOVIRAX Take 200 mg by mouth 2 (two) times daily.   aspirin EC 81 MG tablet Take 81 mg by mouth daily.   baclofen 10 MG tablet Commonly known as:  LIORESAL Take 1 tablet (10 mg total) by mouth 3 (three) times daily.   gabapentin 600 MG tablet Commonly known as:  NEURONTIN Take 1 tablet (600 mg total) by mouth 3 (three) times daily.   lenalidomide 5 MG capsule Commonly known as:  REVLIMID TAKE 1 CAPSULE BY MOUTH ONE TIME DAILY   oxyCODONE 5 MG immediate release tablet Commonly known as:  Oxy IR/ROXICODONE Take 0.5 tablets (2.5  mg total) by mouth 2 (two) times daily.   pantoprazole 40 MG tablet Commonly known as:  PROTONIX Take 40 mg by mouth daily.   pregabalin 150 MG capsule Commonly known as:  LYRICA Take 1 capsule (150 mg total) by mouth 2 (two) times daily.   promethazine 25 MG tablet Commonly known as:  PHENERGAN   Vitamin D (Ergocalciferol) 1.25 MG (50000 UT) Caps capsule Commonly known as:  DRISDOL Take 50,000 Units by mouth every 7 (seven) days. Takes on Monday       Allergies: No Known Allergies  Family History: Family History  Problem Relation Age of Onset  . Cancer Mother 75       Pancreatic  . COPD Mother   . Diabetes Mother   . Hyperlipidemia Mother   . Hypertension Mother   . Cancer Maternal Uncle        pancreatic  . Cancer Maternal Grandmother 61       colon  . COPD Maternal Grandmother   . Diabetes Maternal Grandmother   . Hypertension Maternal Grandmother     Social History:  reports that he quit smoking about 4 years ago. His smokeless tobacco use includes snuff. He reports that he does not drink alcohol or use drugs.  ROS: UROLOGY Frequent Urination?:  No Hard to postpone urination?: No Burning/pain with urination?: No Get up at night to urinate?: No Leakage of urine?: No Urine stream starts and stops?: No Trouble starting stream?: Yes Do you have to strain to urinate?: Yes Blood in urine?: No Urinary tract infection?: No Sexually transmitted disease?: No Injury to kidneys or bladder?: No Painful intercourse?: No Weak stream?: No Erection problems?: Yes Penile pain?: No  Gastrointestinal Nausea?: No Vomiting?: No Indigestion/heartburn?: No Diarrhea?: No Constipation?: No  Constitutional Fever: No Night sweats?: No Weight loss?: No Fatigue?: No  Skin Skin rash/lesions?: No Itching?: No  Eyes Blurred vision?: No Double vision?: No  Ears/Nose/Throat Sore throat?: No Sinus problems?: No  Hematologic/Lymphatic Swollen glands?: No Easy  bruising?: No  Cardiovascular Leg swelling?: Yes Chest pain?: No  Respiratory Cough?: No Shortness of breath?: No  Endocrine Excessive thirst?: No  Musculoskeletal Back pain?: Yes Joint pain?: Yes  Neurological Headaches?: No Dizziness?: No  Psychologic Depression?: No Anxiety?: No  Physical Exam: BP 127/87 (BP Location: Left Arm, Patient Position: Sitting, Cuff Size: Normal)   Pulse (!) 56   Ht '5\' 7"'  (1.702 m)   Wt 159 lb 11.2 oz (72.4 kg)   BMI 25.01 kg/m   Constitutional:  Alert and oriented, No acute distress.  He brought in standard Trimix today.  I reviewed anatomic landmarks including the dorsal veins/nerves and urethra.  Injection sites were reviewed.  He was observed to successfully inject 0.05 mcg of Trimix into the right corpus cavernosum.   Assessment & Plan:   47 year old male with erectile dysfunction.  He had an erection lasting slightly over an hour and states it was a rigid erection which he would have been able to have successful intercourse.  He will continue this dose.  Follow-up 3 months and he was instructed to call should he need any additional injection technique instructions.  PVR by bladder scan was 0 mL  Return in about 3 months (around 05/29/2018) for Recheck.   Abbie Sons, [place] [address] [place], [place] [address] [phone]

## 2018-03-02 ENCOUNTER — Other Ambulatory Visit: Payer: Self-pay | Admitting: Pain Medicine

## 2018-03-02 DIAGNOSIS — M792 Neuralgia and neuritis, unspecified: Secondary | ICD-10-CM

## 2018-03-03 ENCOUNTER — Encounter: Payer: No Typology Code available for payment source | Admitting: Physical Therapy

## 2018-03-03 ENCOUNTER — Telehealth: Payer: Self-pay

## 2018-03-03 ENCOUNTER — Encounter: Payer: Self-pay | Admitting: Internal Medicine

## 2018-03-03 ENCOUNTER — Ambulatory Visit (INDEPENDENT_AMBULATORY_CARE_PROVIDER_SITE_OTHER): Payer: No Typology Code available for payment source | Admitting: Internal Medicine

## 2018-03-03 VITALS — BP 124/78 | HR 69 | Temp 98.2°F | Wt 158.0 lb

## 2018-03-03 DIAGNOSIS — M26621 Arthralgia of right temporomandibular joint: Secondary | ICD-10-CM | POA: Diagnosis not present

## 2018-03-03 MED ORDER — PREDNISONE 10 MG PO TABS: ORAL_TABLET | ORAL | 0 refills | Status: DC

## 2018-03-03 NOTE — Progress Notes (Signed)
Subjective:    Patient ID: James House., male    DOB: 1970/10/27, 47 y.o.   MRN: [id number]  HPI  Pt presents to the clinic today with c/o right TMJ and right ear pain. He reports this started 3 days ago. He describes the pain as sharp. The pain is worse with opening his mouth and chewing. He denies ear drainage or decreased hearing. He does have some runny nose and nasal congestion. He denies sore throat and cough. He reports the dentist tells him he grinds his teeth at night. He has poor dentition but denies tooth pain at this time. He has taken Ibuprofen with minimal relief.  Review of Systems      Past Medical History:  Diagnosis Date  . CKD (chronic kidney disease) stage 3, GFR 30-59 ml/min (HCC)   . Hypertension   . Multiple myeloma ([facility]) 08/11/2017  . Neuropathy   . Pneumonia     Current Outpatient Medications  Medication Sig Dispense Refill  . acetaminophen (TYLENOL) 325 MG tablet Take 2 tablets (650 mg total) by mouth every 6 (six) hours as needed for mild pain (or Fever >/= 101).    Marland Kitchen acyclovir (ZOVIRAX) 200 MG capsule Take 200 mg by mouth 2 (two) times daily.     Marland Kitchen aspirin EC 81 MG tablet Take 81 mg by mouth daily.    Marland Kitchen lenalidomide (REVLIMID) 5 MG capsule TAKE 1 CAPSULE BY MOUTH ONE TIME DAILY 28 capsule 3  . pantoprazole (PROTONIX) 40 MG tablet Take 40 mg by mouth daily.    . promethazine (PHENERGAN) 25 MG tablet   0  . Vitamin D, Ergocalciferol, (DRISDOL) 50000 units CAPS capsule Take 50,000 Units by mouth every 7 (seven) days. Takes on Monday    . baclofen (LIORESAL) 10 MG tablet Take 1 tablet (10 mg total) by mouth 3 (three) times daily. 90 tablet 0  . gabapentin (NEURONTIN) 600 MG tablet Take 1 tablet (600 mg total) by mouth 3 (three) times daily. 90 tablet 0  . oxyCODONE (OXY IR/ROXICODONE) 5 MG immediate release tablet Take 0.5 tablets (2.5 mg total) by mouth 2 (two) times daily. 30 tablet 0  . pregabalin (LYRICA) 150 MG capsule Take 1 capsule (150 mg total) by  mouth 2 (two) times daily. 60 capsule 0   No current facility-administered medications for this visit.     No Known Allergies  Family History  Problem Relation Age of Onset  . Cancer Mother 42       Pancreatic  . COPD Mother   . Diabetes Mother   . Hyperlipidemia Mother   . Hypertension Mother   . Cancer Maternal Uncle        pancreatic  . Cancer Maternal Grandmother 60       colon  . COPD Maternal Grandmother   . Diabetes Maternal Grandmother   . Hypertension Maternal Grandmother     Social History   Socioeconomic History  . Marital status: Married    Spouse name: [name]  . Number of children: 3  . Years of education: Not on file  . Highest education level: Not on file  Occupational History  . Not on file  Social Needs  . Financial resource strain: Not hard at all  . Food insecurity:    Worry: Never true    Inability: Never true  . Transportation needs:    Medical: No    Non-medical: No  Tobacco Use  . Smoking status: Former Smoker  Last attempt to quit: 08/19/2013    Years since quitting: 4.5  . Smokeless tobacco: Current User    Types: Snuff  Substance and Sexual Activity  . Alcohol use: No  . Drug use: No  . Sexual activity: Yes  Lifestyle  . Physical activity:    Days per week: 7 days    Minutes per session: 30 min  . Stress: Only a little  Relationships  . Social connections:    Talks on phone: Once a week    Gets together: Once a week    Attends religious service: More than 4 times per year    Active member of club or organization: No    Attends meetings of clubs or organizations: Never    Relationship status: Married  . Intimate partner violence:    Fear of current or ex partner: No    Emotionally abused: No    Physically abused: No    Forced sexual activity: No  Other Topics Concern  . Not on file  Social History Narrative   Live in private residence with spouse and mother in law     Constitutional: Denies fever, malaise, fatigue,  headache or abrupt weight changes.  HEENT: Pt reports runny nose, ear pain, nasal congestion. Denies eye pain, eye redness, ringing in the ears, wax buildup, runny nose, nasal congestion, bloody nose, or sore throat. Respiratory: Denies difficulty breathing, shortness of breath, cough or sputum production.   Cardiovascular: Denies chest pain, chest tightness, palpitations or swelling in the hands or feet.  Musculoskeletal: Pt reports TMJ pain. Denies decrease in range of motion, difficulty with gait, muscle pain or joint swelling.   No other specific complaints in a complete review of systems (except as listed in HPI above).  Objective:   Physical Exam   BP 124/78   Pulse 69   Temp 98.2 F (36.8 C) (Oral)   Wt 158 lb (71.7 kg)   SpO2 98%   BMI 24.75 kg/m  Wt Readings from Last 3 Encounters:  03/03/18 158 lb (71.7 kg)  02/26/18 159 lb 11.2 oz (72.4 kg)  02/12/18 156 lb 9.6 oz (71 kg)    General: Appears his stated age, well developed, well nourished in NAD. HEENT: Head: normal shape and size, no sinus tenderness noted; Ears: Tm's gray and intact, normal light reflex; Throat/Mouth: Teeth missing mucosa pink and moist, no exudate, lesions or ulcerations noted.  Neck:  Neck supple, trachea midline. No masses, lumps or thyromegaly present.  Cardiovascular: Normal rate and rhythm.  Pulmonary/Chest: Normal effort and positive vesicular breath sounds. No respiratory distress. No wheezes, rales or ronchi noted.  Musculoskeletal: Pain with palpation of the right TMJ. No clicking or popping noted.  Neurological: Alert and oriented.   BMET    Component Value Date/Time   NA 138 02/12/2018 1027   NA 140 11/26/2017 1307   K 3.8 02/12/2018 1027   CL 105 02/12/2018 1027   CO2 28 02/12/2018 1027   GLUCOSE 87 02/12/2018 1027   BUN 12 02/12/2018 1027   BUN 11 11/26/2017 1307   CREATININE 1.52 (H) 02/12/2018 1027   CALCIUM 8.6 (L) 02/12/2018 1027   GFRNONAA 53 (L) 02/12/2018 1027   GFRAA  >60 02/12/2018 1027    Lipid Panel  No results found for: CHOL, TRIG, HDL, CHOLHDL, VLDL, LDLCALC  CBC    Component Value Date/Time   WBC 3.4 (L) 02/12/2018 1027   RBC 4.12 (L) 02/12/2018 1027   HGB 13.2 02/12/2018 1027  HCT 39.3 02/12/2018 1027   PLT 166 02/12/2018 1027   MCV 95.4 02/12/2018 1027   MCH 32.0 02/12/2018 1027   MCHC 33.6 02/12/2018 1027   RDW 14.0 02/12/2018 1027   LYMPHSABS 0.8 02/12/2018 1027   MONOABS 0.5 02/12/2018 1027   EOSABS 0.3 02/12/2018 1027   BASOSABS 0.1 02/12/2018 1027    Hgb A1C No results found for: HGBA1C         Assessment & Plan:   TMJ Pain:  eRx for Pred Taper x 6 days- avoid all other OTC NSAID's Ice may help reduce pain and swelling Encouraged jaw exercises If worsens, consider xray at hospital  Return precautions discussed Webb Silversmith, NP

## 2018-03-03 NOTE — Patient Instructions (Signed)
Temporomandibular Joint Syndrome Temporomandibular joint (TMJ) syndrome is a condition that affects the joints between your jaw and your skull. The TMJs are located near your ears and allow your jaw to open and close. These joints and the nearby muscles are involved in all movements of the jaw. People with TMJ syndrome have pain in the area of these joints and muscles. Chewing, biting, or other movements of the jaw can be difficult or painful. TMJ syndrome can be caused by various things. In many cases, the condition is mild and goes away within a few weeks. For some people, the condition can become a long-term problem. What are the causes? Possible causes of TMJ syndrome include:  Grinding your teeth or clenching your jaw. Some people do this when they are under stress.  Arthritis.  Injury to the jaw.  Head or neck injury.  Teeth or dentures that are not aligned well.  In some cases, the cause of TMJ syndrome may not be known. What are the signs or symptoms? The most common symptom is an aching pain on the side of the head in the area of the TMJ. Other symptoms may include:  Pain when moving your jaw, such as when chewing or biting.  Being unable to open your jaw all the way.  Making a clicking sound when you open your mouth.  Headache.  Earache.  Neck or shoulder pain.  How is this diagnosed? Diagnosis can usually be made based on your symptoms, your medical history, and a physical exam. Your health care provider may check the range of motion of your jaw. Imaging tests, such as X-rays or an MRI, are sometimes done. You may need to see your dentist to determine if your teeth and jaw are lined up correctly. How is this treated? TMJ syndrome often goes away on its own. If treatment is needed, the options may include:  Eating soft foods and applying ice or heat.  Medicines to relieve pain or inflammation.  Medicines to relax the muscles.  A splint, bite plate, or mouthpiece  to prevent teeth grinding or jaw clenching.  Relaxation techniques or counseling to help reduce stress.  Transcutaneous electrical nerve stimulation (TENS). This helps to relieve pain by applying an electrical current through the skin.  Acupuncture. This is sometimes helpful to relieve pain.  Jaw surgery. This is rarely needed.  Follow these instructions at home:  Take medicines only as directed by your health care provider.  Eat a soft diet if you are having trouble chewing.  Apply ice to the painful area. ? Put ice in a plastic bag. ? Place a towel between your skin and the bag. ? Leave the ice on for 20 minutes, 2-3 times a day.  Apply a warm compress to the painful area as directed.  Massage your jaw area and perform any jaw stretching exercises as recommended by your health care provider.  If you were given a mouthpiece or bite plate, wear it as directed.  Avoid foods that require a lot of chewing. Do not chew gum.  Keep all follow-up visits as directed by your health care provider. This is important. Contact a health care provider if:  You are having trouble eating.  You have new or worsening symptoms. Get help right away if:  Your jaw locks open or closed. This information is not intended to replace advice given to you by your health care provider. Make sure you discuss any questions you have with your health care provider. Document   Released: 01/01/2001 Document Revised: 12/07/2015 Document Reviewed: 11/11/2013 Elsevier Interactive Patient Education  2018 [employer]  

## 2018-03-03 NOTE — Telephone Encounter (Signed)
Last prescribed Baclofen on 01-26-18. Has no appt scheduled. WIll ask Dr. Dossie Arbour about this.

## 2018-03-03 NOTE — Telephone Encounter (Signed)
noted 

## 2018-03-03 NOTE — Telephone Encounter (Signed)
Pt said for 2 days had pain at [place] on rt side; today pain level is 8 ,pt can swallow but cannot chew,TM joint area swollen and pt has been using ice to area. Throat is not swollen. Pt scheduled with R Baity NP 03/03/18 at 2 PM. If pt cannot swallow or pain is too extreme to wait on appt today pt will go to ED. FYI to Avie Echevaria NP.

## 2018-03-03 NOTE — Telephone Encounter (Signed)
Pt wanted to make Dr aware he was put on prednisone For jaw infection, also can he get refill on muscle relaxer.please let pt know

## 2018-03-04 NOTE — Telephone Encounter (Signed)
Spoke with Dr. Dossie Arbour. Patient was ordered facet block. This was not scheduled. Find out why he did not have this. If he wants to schedule it, Baclofen will be prescribed at that appt. If not, make appointment to see Crystal. Attempted to call patient, no answer, unable to leave a message.

## 2018-03-05 ENCOUNTER — Ambulatory Visit: Payer: No Typology Code available for payment source | Attending: Nurse Practitioner | Admitting: Nurse Practitioner

## 2018-03-05 ENCOUNTER — Other Ambulatory Visit: Payer: Self-pay

## 2018-03-05 ENCOUNTER — Encounter: Payer: No Typology Code available for payment source | Admitting: Physical Therapy

## 2018-03-05 ENCOUNTER — Encounter: Payer: Self-pay | Admitting: Nurse Practitioner

## 2018-03-05 VITALS — BP 136/93 | HR 76 | Temp 98.3°F | Resp 16 | Ht 67.0 in | Wt 150.0 lb

## 2018-03-05 DIAGNOSIS — R700 Elevated erythrocyte sedimentation rate: Secondary | ICD-10-CM | POA: Insufficient documentation

## 2018-03-05 DIAGNOSIS — M1711 Unilateral primary osteoarthritis, right knee: Secondary | ICD-10-CM | POA: Diagnosis not present

## 2018-03-05 DIAGNOSIS — Z79899 Other long term (current) drug therapy: Secondary | ICD-10-CM | POA: Insufficient documentation

## 2018-03-05 DIAGNOSIS — I129 Hypertensive chronic kidney disease with stage 1 through stage 4 chronic kidney disease, or unspecified chronic kidney disease: Secondary | ICD-10-CM | POA: Insufficient documentation

## 2018-03-05 DIAGNOSIS — M47816 Spondylosis without myelopathy or radiculopathy, lumbar region: Secondary | ICD-10-CM | POA: Diagnosis not present

## 2018-03-05 DIAGNOSIS — G894 Chronic pain syndrome: Secondary | ICD-10-CM

## 2018-03-05 DIAGNOSIS — Z79891 Long term (current) use of opiate analgesic: Secondary | ICD-10-CM | POA: Diagnosis not present

## 2018-03-05 DIAGNOSIS — Z833 Family history of diabetes mellitus: Secondary | ICD-10-CM | POA: Insufficient documentation

## 2018-03-05 DIAGNOSIS — Z825 Family history of asthma and other chronic lower respiratory diseases: Secondary | ICD-10-CM | POA: Insufficient documentation

## 2018-03-05 DIAGNOSIS — G8929 Other chronic pain: Secondary | ICD-10-CM | POA: Diagnosis not present

## 2018-03-05 DIAGNOSIS — Z7952 Long term (current) use of systemic steroids: Secondary | ICD-10-CM | POA: Diagnosis not present

## 2018-03-05 DIAGNOSIS — G629 Polyneuropathy, unspecified: Secondary | ICD-10-CM | POA: Insufficient documentation

## 2018-03-05 DIAGNOSIS — Z96652 Presence of left artificial knee joint: Secondary | ICD-10-CM | POA: Diagnosis not present

## 2018-03-05 DIAGNOSIS — Z7982 Long term (current) use of aspirin: Secondary | ICD-10-CM | POA: Insufficient documentation

## 2018-03-05 DIAGNOSIS — M62830 Muscle spasm of back: Secondary | ICD-10-CM | POA: Insufficient documentation

## 2018-03-05 DIAGNOSIS — M792 Neuralgia and neuritis, unspecified: Secondary | ICD-10-CM

## 2018-03-05 DIAGNOSIS — Z87891 Personal history of nicotine dependence: Secondary | ICD-10-CM | POA: Diagnosis not present

## 2018-03-05 DIAGNOSIS — N183 Chronic kidney disease, stage 3 (moderate): Secondary | ICD-10-CM | POA: Diagnosis not present

## 2018-03-05 DIAGNOSIS — C9000 Multiple myeloma not having achieved remission: Secondary | ICD-10-CM | POA: Insufficient documentation

## 2018-03-05 DIAGNOSIS — M545 Low back pain: Secondary | ICD-10-CM | POA: Diagnosis present

## 2018-03-05 DIAGNOSIS — M7918 Myalgia, other site: Secondary | ICD-10-CM | POA: Diagnosis not present

## 2018-03-05 DIAGNOSIS — Z8249 Family history of ischemic heart disease and other diseases of the circulatory system: Secondary | ICD-10-CM | POA: Insufficient documentation

## 2018-03-05 DIAGNOSIS — E538 Deficiency of other specified B group vitamins: Secondary | ICD-10-CM | POA: Diagnosis not present

## 2018-03-05 MED ORDER — BACLOFEN 10 MG PO TABS: 10.0000 mg | ORAL_TABLET | Freq: Three times a day (TID) | ORAL | 0 refills | Status: DC

## 2018-03-05 MED ORDER — PREGABALIN 150 MG PO CAPS: 150.0000 mg | ORAL_CAPSULE | Freq: Two times a day (BID) | ORAL | 0 refills | Status: DC

## 2018-03-05 MED ORDER — GABAPENTIN 600 MG PO TABS: 600.0000 mg | ORAL_TABLET | Freq: Three times a day (TID) | ORAL | 0 refills | Status: DC

## 2018-03-05 MED ORDER — OXYCODONE HCL 5 MG PO TABS: 2.5000 mg | ORAL_TABLET | Freq: Two times a day (BID) | ORAL | 0 refills | Status: DC

## 2018-03-05 NOTE — Progress Notes (Signed)
Nursing Pain Medication Assessment:  Safety precautions to be maintained throughout the outpatient stay will include: orient to surroundings, keep bed in low position, maintain call bell within reach at all times, provide assistance with transfer out of bed and ambulation.  Medication Inspection Compliance: Pill count conducted under aseptic conditions, in front of the patient. Neither the pills nor the bottle was removed from the patient's sight at any time. Once count was completed pills were immediately returned to the patient in their original bottle.  Medication: Oxycodone IR Pill/Patch Count: 0 of 30 pills remain Pill/Patch Appearance: Markings consistent with prescribed medication Bottle Appearance: Standard pharmacy container. Clearly labeled. Filled Date: 69 / 07 / 2019 Last Medication intake:  Today

## 2018-03-05 NOTE — Patient Instructions (Addendum)
____________________________________________________________________________________________  Medication Rules  Applies to: All patients receiving prescriptions (written or electronic).  Pharmacy of record: Pharmacy where electronic prescriptions will be sent. If written prescriptions are taken to a different pharmacy, please inform the nursing staff. The pharmacy listed in the electronic medical record should be the one where you would like electronic prescriptions to be sent.  Prescription refills: Only during scheduled appointments. Applies to both, written and electronic prescriptions.  NOTE: The following applies primarily to controlled substances (Opioid* Pain Medications).   Patient's responsibilities: 1. Pain Pills: Bring all pain pills to every appointment (except for procedure appointments). 2. Pill Bottles: Bring pills in original pharmacy bottle. Always bring newest bottle. Bring bottle, even if empty. 3. Medication refills: You are responsible for knowing and keeping track of what medications you need refilled. The day before your appointment, write a list of all prescriptions that need to be refilled. Bring that list to your appointment and give it to the admitting nurse. Prescriptions will be written only during appointments. If you forget a medication, it will not be "Called in", "Faxed", or "electronically sent". You will need to get another appointment to get these prescribed. 4. Prescription Accuracy: You are responsible for carefully inspecting your prescriptions before leaving our office. Have the discharge nurse carefully go over each prescription with you, before taking them home. Make sure that your name is accurately spelled, that your address is correct. Check the name and dose of your medication to make sure it is accurate. Check the number of pills, and the written instructions to make sure they are clear and accurate. Make sure that you are given enough medication to last  until your next medication refill appointment. 5. Taking Medication: Take medication as prescribed. Never take more pills than instructed. Never take medication more frequently than prescribed. Taking less pills or less frequently is permitted and encouraged, when it comes to controlled substances (written prescriptions).  6. Inform other Doctors: Always inform, all of your healthcare providers, of all the medications you take. 7. Pain Medication from other Providers: You are not allowed to accept any additional pain medication from any other Doctor or Healthcare provider. There are two exceptions to this rule. (see below) In the event that you require additional pain medication, you are responsible for notifying us, as stated below. 8. Medication Agreement: You are responsible for carefully reading and following our Medication Agreement. This must be signed before receiving any prescriptions from our practice. Safely store a copy of your signed Agreement. Violations to the Agreement will result in no further prescriptions. (Additional copies of our Medication Agreement are available upon request.) 9. Laws, Rules, & Regulations: All patients are expected to follow all Federal and State Laws, Statutes, Rules, & Regulations. Ignorance of the Laws does not constitute a valid excuse. The use of any illegal substances is prohibited. 10. Adopted CDC guidelines & recommendations: Target dosing levels will be at or below 60 MME/day. Use of benzodiazepines** is not recommended.  Exceptions: There are only two exceptions to the rule of not receiving pain medications from other Healthcare Providers. 1. Exception #1 (Emergencies): In the event of an emergency (i.e.: accident requiring emergency care), you are allowed to receive additional pain medication. However, you are responsible for: As soon as you are able, call our office [phone], at any time of the day or night, and leave a message stating your name, the  date and nature of the emergency, and the name and dose of the medication   prescribed. In the event that your call is answered by a member of our staff, make sure to document and save the date, time, and the name of the person that took your information.  2. Exception #2 (Planned Surgery): In the event that you are scheduled by another doctor or dentist to have any type of surgery or procedure, you are allowed (for a period no longer than 30 days), to receive additional pain medication, for the acute post-op pain. However, in this case, you are responsible for picking up a copy of our "Post-op Pain Management for Surgeons" handout, and giving it to your surgeon or dentist. This document is available at our office, and does not require an appointment to obtain it. Simply go to our office during business hours (Monday-Thursday from 8:00 AM to 4:00 PM) (Friday 8:00 AM to 12:00 Noon) or if you have a scheduled appointment with [place], prior to your surgery, and ask for it by name. In addition, you will need to provide [place] with your name, name of your surgeon, type of surgery, and date of procedure or surgery.  *Opioid medications include: morphine, codeine, oxycodone, oxymorphone, hydrocodone, hydromorphone, meperidine, tramadol, tapentadol, buprenorphine, fentanyl, methadone. **Benzodiazepine medications include: diazepam (Valium), alprazolam (Xanax), clonazepam (Klonopine), lorazepam (Ativan), clorazepate (Tranxene), chlordiazepoxide (Librium), estazolam (Prosom), oxazepam (Serax), temazepam (Restoril), triazolam (Halcion) (Last updated: 06/19/2017) ____________________________________________________________________________________________    ______________________________________________________________________________________________  Specialty Pain Scale  Introduction:  There are significant differences in how pain is reported. The word pain usually refers to physical pain, but it is also a common synonym  of suffering. The medical community uses a scale from 0 (zero) to 10 (ten) to report pain level. Zero (0) is described as "no pain", while ten (10) is described as "the worse pain you can imagine". The problem with this scale is that physical pain is reported along with suffering. Suffering refers to mental pain, or more often yet it refers to any unpleasant feeling, emotion or aversion associated with the perception of harm or threat of harm. It is the psychological component of pain.  Pain Specialists prefer to separate the two components. The pain scale used by this practice is the Verbal Numerical Rating Scale (VNRS-11). This scale is for the physical pain only. DO NOT INCLUDE how your pain psychologically affects you. This scale is for adults 30 years of age and older. It has 11 (eleven) levels. The 1st level is 0/10. This means: "right now, I have no pain". In the context of pain management, it also means: "right now, my physical pain is under control with the current therapy".  General Information:  The scale should reflect your current level of pain. Unless you are specifically asked for the level of your worst pain, or your average pain. If you are asked for one of these two, then it should be understood that it is over the past 24 hours.  Levels 1 (one) through 5 (five) are described below, and can be treated as an outpatient. Ambulatory pain management facilities such as ours are more than adequate to treat these levels. Levels 6 (six) through 10 (ten) are also described below, however, these must be treated as a hospitalized patient. While levels 6 (six) and 7 (seven) may be evaluated at an urgent care facility, levels 8 (eight) through 10 (ten) constitute medical emergencies and as such, they belong in a hospital's emergency department. When having these levels (as described below), do not come to our office. Our facility is not equipped to manage  these levels. Go directly to an urgent care  facility or an emergency department to be evaluated.  Definitions:  Activities of Daily Living (ADL): Activities of daily living (ADL or ADLs) is a term used in healthcare to refer to people's daily self-care activities. Health professionals often use a person's ability or inability to perform ADLs as a measurement of their functional status, particularly in regard to people post injury, with disabilities and the elderly. There are two ADL levels: Basic and Instrumental. Basic Activities of Daily Living (BADL  or BADLs) consist of self-care tasks that include: Bathing and showering; personal hygiene and grooming (including brushing/combing/styling hair); dressing; Toilet hygiene (getting to the toilet, cleaning oneself, and getting back up); eating and self-feeding (not including cooking or chewing and swallowing); functional mobility, often referred to as "transferring", as measured by the ability to walk, get in and out of bed, and get into and out of a chair; the broader definition (moving from one place to another while performing activities) is useful for people with different physical abilities who are still able to get around independently. Basic ADLs include the things many people do when they get up in the morning and get ready to go out of the house: get out of bed, go to the toilet, bathe, dress, groom, and eat. On the average, loss of function typically follows a particular order. Hygiene is the first to go, followed by loss of toilet use and locomotion. The last to go is the ability to eat. When there is only one remaining area in which the person is independent, there is a 62.9% chance that it is eating and only a 3.5% chance that it is hygiene. Instrumental Activities of Daily Living (IADL or IADLs) are not necessary for fundamental functioning, but they let an individual live independently in a community. IADL consist of tasks that include: cleaning and maintaining the house; home establishment  and maintenance; care of others (including selecting and supervising caregivers); care of pets; child rearing; managing money; managing financials (investments, etc.); meal preparation and cleanup; shopping for groceries and necessities; moving within the community; safety procedures and emergency responses; health management and maintenance (taking prescribed medications); and using the telephone or other form of communication.  Instructions:  Most patients tend to report their pain as a combination of two factors, their physical pain and their psychosocial pain. This last one is also known as "suffering" and it is reflection of how physical pain affects you socially and psychologically. From now on, report them separately.  From this point on, when asked to report your pain level, report only your physical pain. Use the following table for reference.  Pain Clinic Pain Levels (0-5/10)  Pain Level Score  Description  No Pain 0   Mild pain 1 Nagging, annoying, but does not interfere with basic activities of daily living (ADL). Patients are able to eat, bathe, get dressed, toileting (being able to get on and off the toilet and perform personal hygiene functions), transfer (move in and out of bed or a chair without assistance), and maintain continence (able to control bladder and bowel functions). Blood pressure and heart rate are unaffected. A normal heart rate for a healthy adult ranges from 60 to 100 bpm (beats per minute).   Mild to moderate pain 2 Noticeable and distracting. Impossible to hide from other people. More frequent flare-ups. Still possible to adapt and function close to normal. It can be very annoying and may have occasional stronger flare-ups. With discipline,  patients may get used to it and adapt.   Moderate pain 3 Interferes significantly with activities of daily living (ADL). It becomes difficult to feed, bathe, get dressed, get on and off the toilet or to perform personal hygiene  functions. Difficult to get in and out of bed or a chair without assistance. Very distracting. With effort, it can be ignored when deeply involved in activities.   Moderately severe pain 4 Impossible to ignore for more than a few minutes. With effort, patients may still be able to manage work or participate in some social activities. Very difficult to concentrate. Signs of autonomic nervous system discharge are evident: dilated pupils (mydriasis); mild sweating (diaphoresis); sleep interference. Heart rate becomes elevated (>115 bpm). Diastolic blood pressure (lower number) rises above 100 mmHg. Patients find relief in laying down and not moving.   Severe pain 5 Intense and extremely unpleasant. Associated with frowning face and frequent crying. Pain overwhelms the senses.  Ability to do any activity or maintain social relationships becomes significantly limited. Conversation becomes difficult. Pacing back and forth is common, as getting into a comfortable position is nearly impossible. Pain wakes you up from deep sleep. Physical signs will be obvious: pupillary dilation; increased sweating; goosebumps; brisk reflexes; cold, clammy hands and feet; nausea, vomiting or dry heaves; loss of appetite; significant sleep disturbance with inability to fall asleep or to remain asleep. When persistent, significant weight loss is observed due to the complete loss of appetite and sleep deprivation.  Blood pressure and heart rate becomes significantly elevated. Caution: If elevated blood pressure triggers a pounding headache associated with blurred vision, then the patient should immediately seek attention at an urgent or emergency care unit, as these may be signs of an impending stroke.    Emergency Department Pain Levels (6-10/10)  Emergency Room Pain 6 Severely limiting. Requires emergency care and should not be seen or managed at an outpatient pain management facility. Communication becomes difficult and requires  great effort. Assistance to reach the emergency department may be required. Facial flushing and profuse sweating along with potentially dangerous increases in heart rate and blood pressure will be evident.   Distressing pain 7 Self-care is very difficult. Assistance is required to transport, or use restroom. Assistance to reach the emergency department will be required. Tasks requiring coordination, such as bathing and getting dressed become very difficult.   Disabling pain 8 Self-care is no longer possible. At this level, pain is disabling. The individual is unable to do even the most "basic" activities such as walking, eating, bathing, dressing, transferring to a bed, or toileting. Fine motor skills are lost. It is difficult to think clearly.   Incapacitating pain 9 Pain becomes incapacitating. Thought processing is no longer possible. Difficult to remember your own name. Control of movement and coordination are lost.   The worst pain imaginable 10 At this level, most patients pass out from pain. When this level is reached, collapse of the autonomic nervous system occurs, leading to a sudden drop in blood pressure and heart rate. This in turn results in a temporary and dramatic drop in blood flow to the brain, leading to a loss of consciousness. Fainting is one of the body's self defense mechanisms. Passing out puts the brain in a calmed state and causes it to shut down for a while, in order to begin the healing process.    Summary: 1. Refer to this scale when providing [place] with your pain level. 2. Be accurate and careful when reporting  your pain level. This will help with your care. 3. Over-reporting your pain level will lead to loss of credibility. 4. Even a level of 1/10 means that there is pain and will be treated at our facility. 5. High, inaccurate reporting will be documented as "Symptom Exaggeration", leading to loss of credibility and suspicions of possible secondary gains such as obtaining  more narcotics, or wanting to appear disabled, for fraudulent reasons. 6. Only pain levels of 5 or below will be seen at our facility. 7. Pain levels of 6 and above will be sent to the Emergency Department and the appointment cancelled. ______________________________________________________________________________________________

## 2018-03-05 NOTE — Progress Notes (Signed)
Patient's Name: James House.  MRN: [id number]  Referring Provider: Pleas Koch, NP  DOB: Nov 20, 1970  PCP: Pleas Koch, NP  DOS: 03/05/2018  Note by: Vevelyn Francois NP  Service setting: Ambulatory outpatient  Specialty: Interventional Pain Management  Location: [facility] (AMB) Pain Management Facility    Patient type: Established    Primary Reason(s) for Visit: Encounter for prescription drug management. (Level of risk: moderate)  CC: Back Pain (low) and Leg Pain (bilateral)  HPI  Mr. Alfieri is a 47 y.o. year old, male patient, who comes today for a medication management evaluation. He has Severe sepsis ([place]); Hyponatremia; Multiple myeloma ([place]); Syncope; CKD (chronic kidney disease), stage III ([place]); HTN (hypertension); Vitamin B 12 deficiency; Chronic low back pain (Primary Area of Pain) (Bilateral) (L>R) w/ sciatica (Bilateral); Chronic lower extremity pain (Secondary Area of Pain) (Bilateral) (L>R); Chronic knee pain (Tertiary Area of Pain) (Bilateral) (L>R); Chronic pain syndrome; Long term current use of opiate analgesic; Pharmacologic therapy; Disorder of skeletal system; Problems influencing health status; Chemotherapy-induced peripheral neuropathy ([place]); Neurogenic pain; Neuropathic pain; History of Partial knee replacement (Left); Chronic knee pain s/p partial replacement (Left); Elevated sedimentation rate; Hypocalcemia; Osteoarthritis of knee (Right); Chronic hip pain (Bilateral) (R>L); Chronic sacroiliac joint pain (Bilateral) (R>L); Other specified dorsopathies, sacral and sacrococcygeal region; Lumbar facet syndrome (Bilateral) (L>R); Spondylosis without myelopathy or radiculopathy, lumbar region; Chronic lumbar radiculopathy (by EMG/PNCV) (L5) (Right); Chronic low back pain (Primary Area of Pain) (Bilateral) (L>R) w/o sciatica; Spasm of back muscles; and Chronic musculoskeletal pain on their problem list. His primarily concern today is the Back Pain (low) and Leg Pain  (bilateral)  Pain Assessment: Location: Lower Back Radiating: legs Onset: More than a month ago Duration: Chronic pain Quality: Tingling, Aching Severity: 8 /10 (subjective, self-reported pain score)  Note: Reported level is compatible with observation. Clinically the patient looks like a 2/10 A 2/10 is viewed as "Mild to Moderate" and described as noticeable and distracting. Impossible to hide from other people. More frequent flare-ups. Still possible to adapt and function close to normal. It can be very annoying and may have occasional stronger flare-ups. With discipline, patients may get used to it and adapt. Information on the proper use of the pain scale provided to the patient today. When using our objective Pain Scale, levels between 6 and 10/10 are said to belong in an emergency room, as it progressively worsens from a 6/10, described as severely limiting, requiring emergency care not usually available at an outpatient pain management facility. At a 6/10 level, communication becomes difficult and requires great effort. Assistance to reach the emergency department may be required. Facial flushing and profuse sweating along with potentially dangerous increases in heart rate and blood pressure will be evident. Effect on ADL:   Timing: Intermittent Modifying factors: heat, meds, showers BP: (!) 136/93  HR: 76  Mr. Wlodarczyk was last scheduled for an appointment on 11/26/2017 for medication management. During today's appointment we reviewed Mr. Jared's chronic pain status, as well as his outpatient medication regimen. He admits that he was having locking on his right jaw. He was seen by PCP and started on steroid.   The patient  reports that he does not use drugs. His body mass index is 23.49 kg/m.  Further details on both, my assessment(s), as well as the proposed treatment plan, please see below.  Controlled Substance Pharmacotherapy Assessment REMS (Risk Evaluation and Mitigation Strategy)   Analgesic: Oxycodone IR 5 mg, 1/2 tab PO BID (  1tab/day) MME/day: 7.5 mg/day.  Hart Rochester, RN  03/05/2018  9:20 AM  Sign at close encounter Nursing Pain Medication Assessment:  Safety precautions to be maintained throughout the outpatient stay will include: orient to surroundings, keep bed in low position, maintain call bell within reach at all times, provide assistance with transfer out of bed and ambulation.  Medication Inspection Compliance: Pill count conducted under aseptic conditions, in front of the patient. Neither the pills nor the bottle was removed from the patient's sight at any time. Once count was completed pills were immediately returned to the patient in their original bottle.  Medication: Oxycodone IR Pill/Patch Count: 0 of 30 pills remain Pill/Patch Appearance: Markings consistent with prescribed medication Bottle Appearance: Standard pharmacy container. Clearly labeled. Filled Date: 44 / 07 / 2019 Last Medication intake:  Today   Pharmacokinetics: Liberation and absorption (onset of action): WNL Distribution (time to peak effect): WNL Metabolism and excretion (duration of action): WNL         Pharmacodynamics: Desired effects: Analgesia: Mr. Kollman reports >50% benefit. Functional ability: Patient reports that medication allows him to accomplish basic ADLs Clinically meaningful improvement in function (CMIF): Sustained CMIF goals met Perceived effectiveness: Described as relatively effective, allowing for increase in activities of daily living (ADL) Undesirable effects: Side-effects or Adverse reactions: None reported Monitoring: [place] PMP: Online review of the past 97-monthperiod conducted. Compliant with practice rules and regulations Last UDS on record: Summary  Date Value Ref Range Status  11/26/2017 FINAL  Final    Comment:    ==================================================================== TOXASSURE COMP DRUG  ANALYSIS,UR ==================================================================== Test                             Result       Flag       Units Drug Present and Declared for Prescription Verification   Oxycodone                      386          EXPECTED   ng/mg creat   Oxymorphone                    555          EXPECTED   ng/mg creat   Noroxycodone                   1797         EXPECTED   ng/mg creat   Noroxymorphone                 323          EXPECTED   ng/mg creat    Sources of oxycodone are scheduled prescription medications.    Oxymorphone, noroxycodone, and noroxymorphone are expected    metabolites of oxycodone. Oxymorphone is also available as a    scheduled prescription medication.   Gabapentin                     PRESENT      EXPECTED   Zolpidem                       PRESENT      EXPECTED   Zolpidem Acid                  PRESENT      EXPECTED  Zolpidem acid is an expected metabolite of zolpidem. Drug Present not Declared for Prescription Verification   Tramadol                       >6849        UNEXPECTED ng/mg creat   O-Desmethyltramadol            >6849        UNEXPECTED ng/mg creat   N-Desmethyltramadol            6836         UNEXPECTED ng/mg creat    Source of tramadol is a prescription medication.    O-desmethyltramadol and N-desmethyltramadol are expected    metabolites of tramadol. Drug Absent but Declared for Prescription Verification   Pregabalin                     Not Detected UNEXPECTED   Tizanidine                     Not Detected UNEXPECTED    Tizanidine, as indicated in the declared medication list, is not    always detected even when used as directed.   Acetaminophen                  Not Detected UNEXPECTED    Acetaminophen, as indicated in the declared medication list, is    not always detected even when used as directed.   Salicylate                     Not Detected UNEXPECTED    Aspirin, as indicated in the declared medication list, is not     always detected even when used as directed. ==================================================================== Test                      Result    Flag   Units      Ref Range   Creatinine              73               mg/dL      >=20 ==================================================================== Declared Medications:  The flagging and interpretation on this report are based on the  following declared medications.  Unexpected results may arise from  inaccuracies in the declared medications.  **Note: The testing scope of this panel includes these medications:  Gabapentin (Neurontin)  Oxycodone  Pregabalin (Lyrica)  **Note: The testing scope of this panel does not include small to  moderate amounts of these reported medications:  Acetaminophen (Tylenol)  Aspirin (Aspirin 81)  Tizanidine (Zanaflex)  Zolpidem (Ambien)  **Note: The testing scope of this panel does not include following  reported medications:  Acyclovir (Zovirax)  Lenalidomide (Revlimid)  Pantoprazole (Protonix)  Sildenafil (Revatio)  Vitamin D2 (Drisdol) ==================================================================== For clinical consultation, please call [phone]. ====================================================================    UDS interpretation: Compliant          Medication Assessment Form: Reviewed. Patient indicates being compliant with therapy Treatment compliance: Compliant Risk Assessment Profile: Aberrant behavior: See prior evaluations. None observed or detected today Comorbid factors increasing risk of overdose: See prior notes. No additional risks detected today Opioid risk tool (ORT) (Total Score): 0 Personal History of Substance Abuse (SUD-Substance use disorder):  Alcohol: Negative  Illegal Drugs: Negative  Rx Drugs: Negative  ORT Risk Level calculation: Low Risk Risk of substance use disorder (SUD): Low Opioid Risk  Tool - 03/05/18 0920      Family History of Substance  Abuse   Alcohol  Negative    Illegal Drugs  Negative    Rx Drugs  Negative      Personal History of Substance Abuse   Alcohol  Negative    Illegal Drugs  Negative    Rx Drugs  Negative      Age   Age between 29-45 years   No      History of Preadolescent Sexual Abuse   History of Preadolescent Sexual Abuse  Negative or Male      Psychological Disease   Psychological Disease  Negative    Depression  Negative      Total Score   Opioid Risk Tool Scoring  0    Opioid Risk Interpretation  Low Risk      ORT Scoring interpretation table:  Score <3 = Low Risk for SUD  Score between 4-7 = Moderate Risk for SUD  Score >8 = High Risk for Opioid Abuse   Risk Mitigation Strategies:  Patient Counseling: Covered Patient-Prescriber Agreement (PPA): Present and active  Notification to other healthcare providers: Done  Pharmacologic Plan: No change in therapy, at this time.             Laboratory Chemistry  Inflammation Markers (CRP: Acute Phase) (ESR: Chronic Phase) Lab Results  Component Value Date   CRP 9 11/26/2017   ESRSEDRATE 16 (H) 11/26/2017   LATICACIDVEN 1.1 08/11/2017                         Rheumatology Markers No results found for: RF, ANA, LABURIC, URICUR, LYMEIGGIGMAB, LYMEABIGMQN, HLAB27                      Renal Function Markers Lab Results  Component Value Date   BUN 12 02/12/2018   CREATININE 1.52 (H) 02/12/2018   BCR 7 (L) 11/26/2017   GFRAA >60 02/12/2018   GFRNONAA 53 (L) 02/12/2018                             Hepatic Function Markers Lab Results  Component Value Date   AST 21 02/12/2018   ALT 21 02/12/2018   ALBUMIN 3.9 02/12/2018   ALKPHOS 67 02/12/2018   LIPASE 52 (H) 12/22/2017                        Electrolytes Lab Results  Component Value Date   NA 138 02/12/2018   K 3.8 02/12/2018   CL 105 02/12/2018   CALCIUM 8.6 (L) 02/12/2018   MG 2.4 (H) 11/26/2017                        Neuropathy Markers Lab Results  Component Value  Date   VITAMINB12 319 11/26/2017   HIV Non Reactive 08/12/2017                        CNS Tests No results found for: COLORCSF, APPEARCSF, RBCCOUNTCSF, WBCCSF, POLYSCSF, LYMPHSCSF, EOSCSF, PROTEINCSF, GLUCCSF, JCVIRUS, CSFOLI, IGGCSF                      Bone Pathology Markers Lab Results  Component Value Date   25OHVITD1 41 11/26/2017   25OHVITD2 4.4 11/26/2017   25OHVITD3 37 11/26/2017  TESTOSTERONE 570 12/29/2017                         Coagulation Parameters Lab Results  Component Value Date   PLT 166 02/12/2018                        Cardiovascular Markers Lab Results  Component Value Date   TROPONINI <0.03 08/23/2017   HGB 13.2 02/12/2018   HCT 39.3 02/12/2018                         CA Markers No results found for: CEA, CA125, LABCA2                      Note: Lab results reviewed.  Recent Diagnostic Imaging Results  DG Si Joints CLINICAL DATA:  Bilateral sacroiliac joint pain.  EXAM: BILATERAL SACROILIAC JOINTS - 3+ VIEW  COMPARISON:  None.  FINDINGS: The sacroiliac joint spaces are maintained. There is no evidence of arthropathy. The sacral ala are maintained. No other bone abnormalities are seen.  IMPRESSION: Negative radiographs of the sacroiliac joints.  Electronically Signed   By: Keith Rake M.D.   On: 01/02/2018 21:35 DG HIP UNILAT W OR W/O PELVIS 2-3 VIEWS LEFT CLINICAL DATA:  Chronic bilateral hip pain. Chronic bilateral sacroiliac joint pain. History of multiple myeloma.  EXAM: DG HIP (WITH OR WITHOUT PELVIS) 2-3V LEFT  COMPARISON:  Abdominopelvic CT reformats 12/22/2017. lumbar spine MRI 01/02/2018  FINDINGS: The cortical margins of the bony pelvis and left hip are intact. No fracture. Pubic symphysis and sacroiliac joints are congruent. Both femoral heads are well-seated in the respective acetabula. Bone island in the right proximal femur. 1 cm lucency in the left iliac bone on recent CT has no correlate on lumbar spine  MRI and is considered benign.  IMPRESSION: Unremarkable radiographs of the pelvis and left hip  Electronically Signed   By: Keith Rake M.D.   On: 01/02/2018 21:35 DG HIP UNILAT W OR W/O PELVIS 2-3 VIEWS RIGHT CLINICAL DATA:  Chronic bilateral hip pain  EXAM: DG HIP (WITH OR WITHOUT PELVIS) 2-3V RIGHT  COMPARISON:  12/22/2017  FINDINGS: No acute fracture. No dislocation.  Unremarkable soft tissues.  IMPRESSION: No acute bony pathology.  Electronically Signed   By: Marybelle Killings M.D.   On: 01/02/2018 21:33 MR LUMBAR SPINE W WO CONTRAST CLINICAL DATA:  Spondylosis without myelopathy. Multiple myeloma in remission. Chronic bilateral low back pain.  EXAM: MRI LUMBAR SPINE WITHOUT AND WITH CONTRAST  TECHNIQUE: Multiplanar and multiecho pulse sequences of the lumbar spine were obtained without and with intravenous contrast.  CONTRAST:  79m MULTIHANCE GADOBENATE DIMEGLUMINE 529 MG/ML IV SOLN  COMPARISON:  CT abdomen 12/22/2017.  Lumbar radiography 12/16/2017.  FINDINGS: Segmentation:  5 lumbar type vertebral bodies.  Alignment:  Normal  Vertebrae: Normal. No fracture or focal bone lesion. Marrow is homogeneous and normal with the exception of a subtle 6 mm T2 bright focus within the left side of the L2 vertebral body which, as an isolated finding is of low concern.  Conus medullaris and cauda equina: Conus extends to the L1 level. Conus and cauda equina appear normal.  Paraspinal and other soft tissues: Normal  Disc levels:  Intervertebral discs are normal in signal characteristics and morphology throughout the lumbar region. Minimal desiccation of the disc at T12-L1 but without bulge, herniation or stenosis. Canal and foramina  are widely patent. There is no facet arthropathy. At no level is the diameter of the canal less than 10 mm.  IMPRESSION: No evidence of degenerative disc disease, degenerative facet disease, malalignment or stenosis.  Single 6  mm focus of T2 signal within the left side of the L2 vertebral body. As an isolated finding, this is of low suspicion. However, the clinical history states multiple myeloma in remission and it would be impossible to exclude a minimal marrow deposit.  Electronically Signed   By: Nelson Chimes M.D.   On: 01/02/2018 14:57  Complexity Note: Imaging results reviewed. Results shared with Mr. Haydu, using Layman's terms.                         Meds   Current Outpatient Medications:  .  acetaminophen (TYLENOL) 325 MG tablet, Take 2 tablets (650 mg total) by mouth every 6 (six) hours as needed for mild pain (or Fever >/= 101)., Disp: , Rfl:  .  acyclovir (ZOVIRAX) 200 MG capsule, Take 200 mg by mouth 2 (two) times daily. , Disp: , Rfl:  .  aspirin EC 81 MG tablet, Take 81 mg by mouth daily., Disp: , Rfl:  .  lenalidomide (REVLIMID) 5 MG capsule, TAKE 1 CAPSULE BY MOUTH ONE TIME DAILY, Disp: 28 capsule, Rfl: 3 .  pantoprazole (PROTONIX) 40 MG tablet, Take 40 mg by mouth daily., Disp: , Rfl:  .  predniSONE (DELTASONE) 10 MG tablet, Take 6 tabs day 1, 5 tabs day 2, 4 tabs day 3, 3 tabs day 4, 2 tabs day 5, 1 tab day 6, Disp: 21 tablet, Rfl: 0 .  promethazine (PHENERGAN) 25 MG tablet, , Disp: , Rfl: 0 .  Vitamin D, Ergocalciferol, (DRISDOL) 50000 units CAPS capsule, Take 50,000 Units by mouth every 7 (seven) days. Takes on Monday, Disp: , Rfl:  .  baclofen (LIORESAL) 10 MG tablet, Take 1 tablet (10 mg total) by mouth 3 (three) times daily., Disp: 90 tablet, Rfl: 0 .  gabapentin (NEURONTIN) 600 MG tablet, Take 1 tablet (600 mg total) by mouth 3 (three) times daily., Disp: 90 tablet, Rfl: 0 .  oxyCODONE (OXY IR/ROXICODONE) 5 MG immediate release tablet, Take 0.5 tablets (2.5 mg total) by mouth 2 (two) times daily., Disp: 30 tablet, Rfl: 0 .  pregabalin (LYRICA) 150 MG capsule, Take 1 capsule (150 mg total) by mouth 2 (two) times daily., Disp: 60 capsule, Rfl: 0  ROS  Constitutional: Denies any fever or  chills Gastrointestinal: No reported hemesis, hematochezia, vomiting, or acute GI distress Musculoskeletal: Denies any acute onset joint swelling, redness, loss of ROM, or weakness Neurological: No reported episodes of acute onset apraxia, aphasia, dysarthria, agnosia, amnesia, paralysis, loss of coordination, or loss of consciousness  Allergies  Mr. Mccarey has No Known Allergies.  [place]  Drug: Mr. Antkowiak  reports that he does not use drugs. Alcohol:  reports that he does not drink alcohol. Tobacco:  reports that he quit smoking about 4 years ago. His smokeless tobacco use includes snuff. Medical:  has a past medical history of CKD (chronic kidney disease) stage 3, GFR 30-59 ml/min ([place]), Hypertension, Multiple myeloma ([place]) (08/11/2017), Neuropathy, and Pneumonia. Surgical: Mr. Stanek  has a past surgical history that includes Joint replacement; Knee surgery (Left); and Abdominal surgery. Family: family history includes COPD in his maternal grandmother and mother; Cancer in his maternal uncle; Cancer (age of onset: 85) in his maternal grandmother; Cancer (age of onset: 46)  in his mother; Diabetes in his maternal grandmother and mother; Hyperlipidemia in his mother; Hypertension in his maternal grandmother and mother.  Constitutional Exam  General appearance: Well nourished, well developed, and well hydrated. In no apparent acute distress Vitals:   03/05/18 0915  BP: (!) 136/93  Pulse: 76  Resp: 16  Temp: 98.3 F (36.8 C)  TempSrc: Oral  SpO2: 100%  Weight: 150 lb (68 kg)  Height: '5\' 7"'$  (1.702 m)  Psych/Mental status: Alert, oriented x 3 (person, place, & time)       Eyes: PERLA Respiratory: No evidence of acute respiratory distress   Lumbar Spine Area Exam  Skin & Axial Inspection: No masses, redness, or swelling Alignment: Symmetrical Functional ROM: Unrestricted ROM       Stability: No instability detected Muscle Tone/Strength: Functionally intact. No obvious neuro-muscular  anomalies detected. Sensory (Neurological): Unimpaired Palpation: No palpable anomalies       Provocative Tests: Hyperextension/rotation test: Positive bilaterally for facet joint pain.  Gait & Posture Assessment  Ambulation: Unassisted Gait: Relatively normal for age and body habitus Posture: WNL   Lower Extremity Exam    Side: Right lower extremity  Side: Left lower extremity  Stability: No instability observed          Stability: No instability observed          Skin & Extremity Inspection: Skin color, temperature, and hair growth are WNL. No peripheral edema or cyanosis. No masses, redness, swelling, asymmetry, or associated skin lesions. No contractures.  Skin & Extremity Inspection: Skin color, temperature, and hair growth are WNL. No peripheral edema or cyanosis. No masses, redness, swelling, asymmetry, or associated skin lesions. No contractures.  Functional ROM: Unrestricted ROM                  Functional ROM: Unrestricted ROM                  Muscle Tone/Strength: Functionally intact. No obvious neuro-muscular anomalies detected.  Muscle Tone/Strength: Functionally intact. No obvious neuro-muscular anomalies detected.   Assessment  Primary Diagnosis & Pertinent Problem List: The primary encounter diagnosis was Spondylosis without myelopathy or radiculopathy, lumbar region. Diagnoses of Spasm of back muscles, Chronic musculoskeletal pain, Neuropathic pain, Neurogenic pain, and Chronic pain syndrome were also pertinent to this visit.  Status Diagnosis  Persistent Persistent Persistent 1. Spondylosis without myelopathy or radiculopathy, lumbar region   2. Spasm of back muscles   3. Chronic musculoskeletal pain   4. Neuropathic pain   5. Neurogenic pain   6. Chronic pain syndrome     Problems updated and reviewed during this visit: No problems updated. Plan of Care  Pharmacotherapy (Medications Ordered): Meds ordered this encounter  Medications  . baclofen (LIORESAL) 10  MG tablet    Sig: Take 1 tablet (10 mg total) by mouth 3 (three) times daily.    Dispense:  90 tablet    Refill:  0    Do not place this medication, or any other prescription from our practice, on "Automatic Refill". Patient may have prescription filled one day early if pharmacy is closed on scheduled refill date.    Order Specific Question:   Supervising Provider    Answer:   Milinda Pointer [phone]  . gabapentin (NEURONTIN) 600 MG tablet    Sig: Take 1 tablet (600 mg total) by mouth 3 (three) times daily.    Dispense:  90 tablet    Refill:  0    Do not place medication on "  Automatic Refill". Fill one day early if pharmacy is closed on scheduled refill date.    Order Specific Question:   Supervising Provider    Answer:   Milinda Pointer [phone]  . oxyCODONE (OXY IR/ROXICODONE) 5 MG immediate release tablet    Sig: Take 0.5 tablets (2.5 mg total) by mouth 2 (two) times daily.    Dispense:  30 tablet    Refill:  0    Do not place this medication, or any other prescription from our practice, on "Automatic Refill". Patient may have prescription filled one day early if pharmacy is closed on scheduled refill date.    Order Specific Question:   Supervising Provider    Answer:   Milinda Pointer [phone]  . pregabalin (LYRICA) 150 MG capsule    Sig: Take 1 capsule (150 mg total) by mouth 2 (two) times daily.    Dispense:  60 capsule    Refill:  0    Do not place medication on "Automatic Refill". Fill one day early if pharmacy is closed on scheduled refill date.    Order Specific Question:   Supervising Provider    Answer:   Milinda Pointer [[id number]]   New Prescriptions   No medications on file   Medications administered today: [name] had no medications administered during this visit. Lab-work, procedure(s), and/or referral(s): No orders of the defined types were placed in this encounter.  Imaging and/or referral(s): None  Interventional management  options: Planned, scheduled, and/or pending: Diagnostic bilateral lumbar facet block#1under fluoroscopic guidance and IV sedation PT referral.    Considering: DiagnosticbilateralLESI Diagnosticbilateral lumbar facet block Possible bilateral lumbar facet RFA  Diagnostic bilateral sacroiliac joint block  Possible bilateral sacroiliac joint RFA  Diagnostic right knee Hyalgan series Diagnosticright knee intra-articular knee injection Diagnostic left knee genicular nerve block   PRN Procedures: None at this time    Provider-requested follow-up: Return in about 26 days (around 03/31/2018) for MedMgmt, Appointment As Scheduled, w/ Dr. Dossie Arbour.  Future Appointments  Date Time Provider [place]  03/13/2018  9:30 AM [facility] None  03/13/2018 10:00 AM Sindy Guadeloupe, MD [facility] None  03/13/2018 10:30 AM [facility] INJECTION [facility] None  03/24/2018  9:15 AM Milinda Pointer, MD [facility] None  03/31/2018  9:00 AM Vevelyn Francois, NP [facility] None  04/10/2018  9:00 AM [facility] None  04/10/2018  9:30 AM Sindy Gu[place], MD [facility] None  04/10/2018 10:00 AM [facility] None  05/25/2018 10:00 AM Stoioff, Ronda Fairly, MD [facility] None   Primary Care Physician: Pleas Koch, NP Location: [facility] Note by: Vevelyn Francois NP Date: 03/05/2018; Time: 10:06 AM  Pain Score Disclaimer: We use the NRS-11 scale. This is a self-reported, subjective measurement of pain severity with only modest accuracy. It is used primarily to identify changes within a particular patient. It must be understood that outpatient pain scales are significantly less accurate that those used for research, where they can be applied under ideal controlled circumstances with minimal exposure to variables. In reality, the score is likely to be a combination of pain intensity and pain affect, where pain affect  describes the degree of emotional arousal or changes in action readiness caused by the sensory experience of pain. Factors such as social and work situation, setting, emotional state, anxiety levels, expectation, and prior pain experience may influence pain perception and show large inter-individual differences that may also be affected by time variables.  Patient instructions  provided during this appointment: Patient Instructions   ____________________________________________________________________________________________  Medication Rules  Applies to: All patients receiving prescriptions (written or electronic).  Pharmacy of record: Pharmacy where electronic prescriptions will be sent. If written prescriptions are taken to a different pharmacy, please inform the nursing staff. The pharmacy listed in the electronic medical record should be the one where you would like electronic prescriptions to be sent.  Prescription refills: Only during scheduled appointments. Applies to both, written and electronic prescriptions.  NOTE: The following applies primarily to controlled substances (Opioid* Pain Medications).   Patient's responsibilities: 1. Pain Pills: Bring all pain pills to every appointment (except for procedure appointments). 2. Pill Bottles: Bring pills in original pharmacy bottle. Always bring newest bottle. Bring bottle, even if empty. 3. Medication refills: You are responsible for knowing and keeping track of what medications you need refilled. The day before your appointment, write a list of all prescriptions that need to be refilled. Bring that list to your appointment and give it to the admitting nurse. Prescriptions will be written only during appointments. If you forget a medication, it will not be "Called in", "Faxed", or "electronically sent". You will need to get another appointment to get these prescribed. 4. Prescription Accuracy: You are responsible for carefully inspecting  your prescriptions before leaving our office. Have the discharge nurse carefully go over each prescription with you, before taking them home. Make sure that your name is accurately spelled, that your address is correct. Check the name and dose of your medication to make sure it is accurate. Check the number of pills, and the written instructions to make sure they are clear and accurate. Make sure that you are given enough medication to last until your next medication refill appointment. 5. Taking Medication: Take medication as prescribed. Never take more pills than instructed. Never take medication more frequently than prescribed. Taking less pills or less frequently is permitted and encouraged, when it comes to controlled substances (written prescriptions).  6. Inform other Doctors: Always inform, all of your healthcare providers, of all the medications you take. 7. Pain Medication from other Providers: You are not allowed to accept any additional pain medication from any other Doctor or Healthcare provider. There are two exceptions to this rule. (see below) In the event that you require additional pain medication, you are responsible for notifying us, as stated below. 8. Medication Agreement: You are responsible for carefully reading and following our Medication Agreement. This must be signed before receiving any prescriptions from our practice. Safely store a copy of your signed Agreement. Violations to the Agreement will result in no further prescriptions. (Additional copies of our Medication Agreement are available upon request.) 9. Laws, Rules, & Regulations: All patients are expected to follow all Federal and [employer], Rules, [employer]. Ignorance of the Laws does not constitute a valid excuse. The use of any illegal substances is prohibited. 10. Adopted CDC guidelines & recommendations: Target dosing levels will be at or below 60 MME/day. Use of benzodiazepines** is not  recommended.  Exceptions: There are only two exceptions to the rule of not receiving pain medications from other Healthcare Providers. 1. Exception #1 (Emergencies): In the event of an emergency (i.e.: accident requiring emergency care), you are allowed to receive additional pain medication. However, you are responsible for: As soon as you are able, call our office [phone], at any time of the day or night, and leave a message stating your name, the date and nature of the emergency,  and the name and dose of the medication prescribed. In the event that your call is answered by a member of our staff, make sure to document and save the date, time, and the name of the person that took your information.  2. Exception #2 (Planned Surgery): In the event that you are scheduled by another doctor or dentist to have any type of surgery or procedure, you are allowed (for a period no longer than 30 days), to receive additional pain medication, for the acute post-op pain. However, in this case, you are responsible for picking up a copy of our "Post-op Pain Management for Surgeons" handout, and giving it to your surgeon or dentist. This document is available at our office, and does not require an appointment to obtain it. Simply go to our office during business hours (Monday-Thursday from 8:00 AM to 4:00 PM) (Friday 8:00 AM to 12:00 Noon) or if you have a scheduled appointment with [place], prior to your surgery, and ask for it by name. In addition, you will need to provide [place] with your name, name of your surgeon, type of surgery, and date of procedure or surgery.  *Opioid medications include: morphine, codeine, oxycodone, oxymorphone, hydrocodone, hydromorphone, meperidine, tramadol, tapentadol, buprenorphine, fentanyl, methadone. **Benzodiazepine medications include: diazepam (Valium), alprazolam (Xanax), clonazepam (Klonopine), lorazepam (Ativan), clorazepate (Tranxene), chlordiazepoxide (Librium), estazolam (Prosom),  oxazepam (Serax), temazepam (Restoril), triazolam (Halcion) (Last updated: 06/19/2017) ____________________________________________________________________________________________    ______________________________________________________________________________________________  Specialty Pain Scale  Introduction:  There are significant differences in how pain is reported. The word pain usually refers to physical pain, but it is also a common synonym of suffering. The medical community uses a scale from 0 (zero) to 10 (ten) to report pain level. Zero (0) is described as "no pain", while ten (10) is described as "the worse pain you can imagine". The problem with this scale is that physical pain is reported along with suffering. Suffering refers to mental pain, or more often yet it refers to any unpleasant feeling, emotion or aversion associated with the perception of harm or threat of harm. It is the psychological component of pain.  Pain Specialists prefer to separate the two components. The pain scale used by this practice is the Verbal Numerical Rating Scale (VNRS-11). This scale is for the physical pain only. DO NOT INCLUDE how your pain psychologically affects you. This scale is for adults 74 years of age and older. It has 11 (eleven) levels. The 1st level is 0/10. This means: "right now, I have no pain". In the context of pain management, it also means: "right now, my physical pain is under control with the current therapy".  General Information:  The scale should reflect your current level of pain. Unless you are specifically asked for the level of your worst pain, or your average pain. If you are asked for one of these two, then it should be understood that it is over the past 24 hours.  Levels 1 (one) through 5 (five) are described below, and can be treated as an outpatient. Ambulatory pain management facilities such as ours are more than adequate to treat these levels. Levels 6 (six) through  10 (ten) are also described below, however, these must be treated as a hospitalized patient. While levels 6 (six) and 7 (seven) may be evaluated at an urgent care facility, levels 8 (eight) through 10 (ten) constitute medical emergencies and as such, they belong in a hospital's emergency department. When having these levels (as described below), do not come to  our office. Our facility is not equipped to manage these levels. Go directly to an urgent care facility or an emergency department to be evaluated.  Definitions:  Activities of Daily Living (ADL): Activities of daily living (ADL or ADLs) is a term used in healthcare to refer to people's daily self-care activities. Health professionals often use a person's ability or inability to perform ADLs as a measurement of their functional status, particularly in regard to people post injury, with disabilities and the elderly. There are two ADL levels: Basic and Instrumental. Basic Activities of Daily Living (BADL  or BADLs) consist of self-care tasks that include: Bathing and showering; personal hygiene and grooming (including brushing/combing/styling hair); dressing; Toilet hygiene (getting to the toilet, cleaning oneself, and getting back up); eating and self-feeding (not including cooking or chewing and swallowing); functional mobility, often referred to as "transferring", as measured by the ability to walk, get in and out of bed, and get into and out of a chair; the broader definition (moving from one place to another while performing activities) is useful for people with different physical abilities who are still able to get around independently. Basic ADLs include the things many people do when they get up in the morning and get ready to go out of the house: get out of bed, go to the toilet, bathe, dress, groom, and eat. On the average, loss of function typically follows a particular order. Hygiene is the first to go, followed by loss of toilet use and  locomotion. The last to go is the ability to eat. When there is only one remaining area in which the person is independent, there is a 62.9% chance that it is eating and only a 3.5% chance that it is hygiene. Instrumental Activities of Daily Living (IADL or IADLs) are not necessary for fundamental functioning, but they let an individual live independently in a community. IADL consist of tasks that include: cleaning and maintaining the house; home establishment and maintenance; care of others (including selecting and supervising caregivers); care of pets; child rearing; managing money; managing financials (investments, etc.); meal preparation and cleanup; shopping for groceries and necessities; moving within the community; safety procedures and emergency responses; health management and maintenance (taking prescribed medications); and using the telephone or other form of communication.  Instructions:  Most patients tend to report their pain as a combination of two factors, their physical pain and their psychosocial pain. This last one is also known as "suffering" and it is reflection of how physical pain affects you socially and psychologically. From now on, report them separately.  From this point on, when asked to report your pain level, report only your physical pain. Use the following table for reference.  Pain Clinic Pain Levels (0-5/10)  Pain Level Score  Description  No Pain 0   Mild pain 1 Nagging, annoying, but does not interfere with basic activities of daily living (ADL). Patients are able to eat, bathe, get dressed, toileting (being able to get on and off the toilet and perform personal hygiene functions), transfer (move in and out of bed or a chair without assistance), and maintain continence (able to control bladder and bowel functions). Blood pressure and heart rate are unaffected. A normal heart rate for a healthy adult ranges from 60 to 100 bpm (beats per minute).   Mild to moderate pain  2 Noticeable and distracting. Impossible to hide from other people. More frequent flare-ups. Still possible to adapt and function close to normal. It can be very  annoying and may have occasional stronger flare-ups. With discipline, patients may get used to it and adapt.   Moderate pain 3 Interferes significantly with activities of daily living (ADL). It becomes difficult to feed, bathe, get dressed, get on and off the toilet or to perform personal hygiene functions. Difficult to get in and out of bed or a chair without assistance. Very distracting. With effort, it can be ignored when deeply involved in activities.   Moderately severe pain 4 Impossible to ignore for more than a few minutes. With effort, patients may still be able to manage work or participate in some social activities. Very difficult to concentrate. Signs of autonomic nervous system discharge are evident: dilated pupils (mydriasis); mild sweating (diaphoresis); sleep interference. Heart rate becomes elevated (>115 bpm). Diastolic blood pressure (lower number) rises above 100 mmHg. Patients find relief in laying down and not moving.   Severe pain 5 Intense and extremely unpleasant. Associated with frowning face and frequent crying. Pain overwhelms the senses.  Ability to do any activity or maintain social relationships becomes significantly limited. Conversation becomes difficult. Pacing back and forth is common, as getting into a comfortable position is nearly impossible. Pain wakes you up from deep sleep. Physical signs will be obvious: pupillary dilation; increased sweating; goosebumps; brisk reflexes; cold, clammy hands and feet; nausea, vomiting or dry heaves; loss of appetite; significant sleep disturbance with inability to fall asleep or to remain asleep. When persistent, significant weight loss is observed due to the complete loss of appetite and sleep deprivation.  Blood pressure and heart rate becomes significantly elevated. Caution:  If elevated blood pressure triggers a pounding headache associated with blurred vision, then the patient should immediately seek attention at an urgent or emergency care unit, as these may be signs of an impending stroke.    Emergency Department Pain Levels (6-10/10)  Emergency Room Pain 6 Severely limiting. Requires emergency care and should not be seen or managed at an outpatient pain management facility. Communication becomes difficult and requires great effort. Assistance to reach the emergency department may be required. Facial flushing and profuse sweating along with potentially dangerous increases in heart rate and blood pressure will be evident.   Distressing pain 7 Self-care is very difficult. Assistance is required to transport, or use restroom. Assistance to reach the emergency department will be required. Tasks requiring coordination, such as bathing and getting dressed become very difficult.   Disabling pain 8 Self-care is no longer possible. At this level, pain is disabling. The individual is unable to do even the most "basic" activities such as walking, eating, bathing, dressing, transferring to a bed, or toileting. Fine motor skills are lost. It is difficult to think clearly.   Incapacitating pain 9 Pain becomes incapacitating. Thought processing is no longer possible. Difficult to remember your own name. Control of movement and coordination are lost.   The worst pain imaginable 10 At this level, most patients pass out from pain. When this level is reached, collapse of the autonomic nervous system occurs, leading to a sudden drop in blood pressure and heart rate. This in turn results in a temporary and dramatic drop in blood flow to the brain, leading to a loss of consciousness. Fainting is one of the body's self defense mechanisms. Passing out puts the brain in a calmed state and causes it to shut down for a while, in order to begin the healing process.    Summary: 1. Refer to this  scale when providing [place] with your  pain level. 2. Be accurate and careful when reporting your pain level. This will help with your care. 3. Over-reporting your pain level will lead to loss of credibility. 4. Even a level of 1/10 means that there is pain and will be treated at our facility. 5. High, inaccurate reporting will be documented as "Symptom Exaggeration", leading to loss of credibility and suspicions of possible secondary gains such as obtaining more narcotics, or wanting to appear disabled, for fraudulent reasons. 6. Only pain levels of 5 or below will be seen at our facility. 7. Pain levels of 6 and above will be sent to the Emergency Department and the appointment cancelled. ______________________________________________________________________________________________

## 2018-03-05 NOTE — Telephone Encounter (Signed)
Came today for appointment. No further action required.

## 2018-03-10 ENCOUNTER — Encounter: Payer: No Typology Code available for payment source | Admitting: Physical Therapy

## 2018-03-12 ENCOUNTER — Encounter: Payer: No Typology Code available for payment source | Admitting: Physical Therapy

## 2018-03-13 ENCOUNTER — Encounter: Payer: Self-pay | Admitting: Oncology

## 2018-03-13 ENCOUNTER — Inpatient Hospital Stay: Payer: No Typology Code available for payment source

## 2018-03-13 ENCOUNTER — Inpatient Hospital Stay (HOSPITAL_BASED_OUTPATIENT_CLINIC_OR_DEPARTMENT_OTHER): Payer: No Typology Code available for payment source | Admitting: Oncology

## 2018-03-13 ENCOUNTER — Other Ambulatory Visit: Payer: Self-pay

## 2018-03-13 ENCOUNTER — Inpatient Hospital Stay: Payer: No Typology Code available for payment source | Attending: Oncology

## 2018-03-13 VITALS — BP 124/86 | HR 65 | Temp 97.6°F | Resp 18 | Ht 67.0 in | Wt 155.9 lb

## 2018-03-13 DIAGNOSIS — C9001 Multiple myeloma in remission: Secondary | ICD-10-CM

## 2018-03-13 DIAGNOSIS — J069 Acute upper respiratory infection, unspecified: Secondary | ICD-10-CM

## 2018-03-13 DIAGNOSIS — D72819 Decreased white blood cell count, unspecified: Secondary | ICD-10-CM

## 2018-03-13 DIAGNOSIS — Z9484 Stem cells transplant status: Secondary | ICD-10-CM

## 2018-03-13 DIAGNOSIS — Z79899 Other long term (current) drug therapy: Secondary | ICD-10-CM

## 2018-03-13 LAB — COMPREHENSIVE METABOLIC PANEL
ALT: 25 U/L (ref 0–44)
ANION GAP: 3 — AB (ref 5–15)
AST: 25 U/L (ref 15–41)
Albumin: 3.6 g/dL (ref 3.5–5.0)
Alkaline Phosphatase: 69 U/L (ref 38–126)
BUN: 15 mg/dL (ref 6–20)
CALCIUM: 8.6 mg/dL — AB (ref 8.9–10.3)
CO2: 30 mmol/L (ref 22–32)
Chloride: 104 mmol/L (ref 98–111)
Creatinine, Ser: 1.54 mg/dL — ABNORMAL HIGH (ref 0.61–1.24)
GFR calc non Af Amer: 52 mL/min — ABNORMAL LOW (ref >60)
Glucose, Bld: 80 mg/dL (ref 70–99)
Potassium: 3.8 mmol/L (ref 3.5–5.1)
SODIUM: 137 mmol/L (ref 135–145)
Total Bilirubin: 0.7 mg/dL (ref 0.3–1.2)
Total Protein: 6.6 g/dL (ref 6.5–8.1)

## 2018-03-13 LAB — CBC WITH DIFFERENTIAL/PLATELET
Abs Immature Granulocytes: 0.02 10*3/uL (ref 0.00–0.07)
BASOS PCT: 3 %
Basophils Absolute: 0.1 10*3/uL (ref 0.0–0.1)
EOS ABS: 0.2 10*3/uL (ref 0.0–0.5)
Eosinophils Relative: 8 %
HCT: 39.1 % (ref 39.0–52.0)
Hemoglobin: 13.2 g/dL (ref 13.0–17.0)
IMMATURE GRANULOCYTES: 1 %
Lymphocytes Relative: 22 %
Lymphs Abs: 0.6 10*3/uL — ABNORMAL LOW (ref 0.7–4.0)
MCH: 32.4 pg (ref 26.0–34.0)
MCHC: 33.8 g/dL (ref 30.0–36.0)
MCV: 96.1 fL (ref 80.0–100.0)
MONOS PCT: 17 %
Monocytes Absolute: 0.5 10*3/uL (ref 0.1–1.0)
NEUTROS PCT: 49 %
Neutro Abs: 1.4 10*3/uL — ABNORMAL LOW (ref 1.7–7.7)
PLATELETS: 140 10*3/uL — AB (ref 150–400)
RBC: 4.07 MIL/uL — AB (ref 4.22–5.81)
RDW: 14.7 % (ref 11.5–15.5)
WBC: 2.8 10*3/uL — AB (ref 4.0–10.5)
nRBC: 0 % (ref 0.0–0.2)

## 2018-03-13 MED ORDER — AMOXICILLIN-POT CLAVULANATE 875-125 MG PO TABS: 1.0000 | ORAL_TABLET | Freq: Two times a day (BID) | ORAL | 0 refills | Status: DC

## 2018-03-13 MED ORDER — DENOSUMAB 120 MG/1.7ML ~~LOC~~ SOLN
120.0000 mg | SUBCUTANEOUS | Status: DC
  Administered 2018-03-13: 120 mg via SUBCUTANEOUS
  Filled 2018-03-13: qty 1.7

## 2018-03-13 NOTE — Progress Notes (Signed)
Hematology/Oncology Consult note [facility]  [phone] [phone]  Patient Care Team: Pleas Koch, NP as PCP - General (Internal Medicine) Vella Redhead, MD as PCP - Hematology/Oncology   Name of the patient: James House  [id number]  02/05/1971   Date of visit: 03/13/18  Diagnosis- light chain kappa multiple myeloma s/p ASCT currently in remission. Patient on maintenance revlimid   Chief complaint/ Reason for visit-routine follow-up of multiple myeloma on maintenance Revlimid  Heme/Onc history: patient is a 47 year old male with a history of kappa light chain myeloma that was treated by [name]. History is as follows:  1. Patient presented with renal insufficiency with a creatinine of 2.4 in April 2018. Kidney biopsy showed myeloma cast nephropathy. Kappa light chain was 3004 and lambda 0.22 with a kappa lambda ratio of 13,000 655. Skeletal survey showed multiple calvarial and marrow lesions. Bone marrow biopsy in May 2018 showed 43% plasma cells. He received CyBorDfor cycle 1 in May 2018 and was subsequently switched to RVD. He received 4 cycles followed by a repeat bone marrow biopsy in August 2018 which showed no increase in plasma cells.  2.He underwent autologous stem cell transplantation on 01/30/2017. He was started on Revlimid maintenance 10 mg daily in January 2019.  3.Labs from January February and March 2019 done at [facility] showed no M spike, normal kappa lambda ratio of 1.06. He was last seen by them in April 2019.  Following that patient was admitted to [facility] for healthcare associated pneumonia and was recently discharged. He wished to transfer his care to [place] at this time.  With regards to his myeloma patient is currently on 10 mg of Revlimid daily. He is also on acyclovir 400 mg twice daily which he is to continue for a minimum of 1 year post transplant. Patient has chronic back pain as well  as chemo-induced peripheral neuropathy for which he was seen by [name] in the past and is currently seeing [facility] pain clinic and is under pain contract with them   Interval history-reports having fever of 100 and 2 at night yesterday.  He has some nasal congestion and mild productive cough.  ECOG PS- 1 Pain scale- 0 Opioid associated constipation- no  Review of systems- Review of Systems  Constitutional: Positive for fever and malaise/fatigue. Negative for chills and weight loss.  HENT: Positive for congestion. Negative for ear discharge and nosebleeds.   Eyes: Negative for blurred vision.  Respiratory: Positive for cough. Negative for hemoptysis, sputum production, shortness of breath and wheezing.   Cardiovascular: Negative for chest pain, palpitations, orthopnea and claudication.  Gastrointestinal: Negative for abdominal pain, blood in stool, constipation, diarrhea, heartburn, melena, nausea and vomiting.  Genitourinary: Negative for dysuria, flank pain, frequency, hematuria and urgency.  Musculoskeletal: Negative for back pain, joint pain and myalgias.  Skin: Negative for rash.  Neurological: Negative for dizziness, tingling, focal weakness, seizures, weakness and headaches.  Endo/Heme/Allergies: Does not bruise/bleed easily.  Psychiatric/Behavioral: Negative for depression and suicidal ideas. The patient does not have insomnia.       No Known Allergies   Past Medical History:  Diagnosis Date  . CKD (chronic kidney disease) stage 3, GFR 30-59 ml/min (HCC)   . Hypertension   . Multiple myeloma ([place]) 08/11/2017  . Neuropathy   . Pneumonia      Past Surgical History:  Procedure Laterality Date  . ABDOMINAL SURGERY    . JOINT REPLACEMENT     right  knee  . KNEE SURGERY Left     Social History   Socioeconomic History  . Marital status: Married    Spouse name: [name]  . Number of children: 3  . Years of education: Not on file  . Highest education level: Not  on file  Occupational History  . Not on file  Social Needs  . Financial resource strain: Not hard at all  . Food insecurity:    Worry: Never true    Inability: Never true  . Transportation needs:    Medical: No    Non-medical: No  Tobacco Use  . Smoking status: Former Smoker    Last attempt to quit: 08/19/2013    Years since quitting: 4.5  . Smokeless tobacco: Current User    Types: Snuff  Substance and Sexual Activity  . Alcohol use: No  . Drug use: No  . Sexual activity: Yes  Lifestyle  . Physical activity:    Days per week: 7 days    Minutes per session: 30 min  . Stress: Only a little  Relationships  . Social connections:    Talks on phone: Once a week    Gets together: Once a week    Attends religious service: More than 4 times per year    Active member of club or organization: No    Attends meetings of clubs or organizations: Never    Relationship status: Married  . Intimate partner violence:    Fear of current or ex partner: No    Emotionally abused: No    Physically abused: No    Forced sexual activity: No  Other Topics Concern  . Not on file  Social History Narrative   Live in private residence with spouse and mother in law    Family History  Problem Relation Age of Onset  . Cancer Mother 31       Pancreatic  . COPD Mother   . Diabetes Mother   . Hyperlipidemia Mother   . Hypertension Mother   . Cancer Maternal Uncle        pancreatic  . Cancer Maternal Grandmother 12       colon  . COPD Maternal Grandmother   . Diabetes Maternal Grandmother   . Hypertension Maternal Grandmother      Current Outpatient Medications:  .  acyclovir (ZOVIRAX) 200 MG capsule, Take 200 mg by mouth 2 (two) times daily. , Disp: , Rfl:  .  aspirin EC 81 MG tablet, Take 81 mg by mouth daily., Disp: , Rfl:  .  baclofen (LIORESAL) 10 MG tablet, Take 1 tablet (10 mg total) by mouth 3 (three) times daily., Disp: 90 tablet, Rfl: 0 .  gabapentin (NEURONTIN) 600 MG tablet,  Take 1 tablet (600 mg total) by mouth 3 (three) times daily., Disp: 90 tablet, Rfl: 0 .  lenalidomide (REVLIMID) 5 MG capsule, TAKE 1 CAPSULE BY MOUTH ONE TIME DAILY, Disp: 28 capsule, Rfl: 3 .  oxyCODONE (OXY IR/ROXICODONE) 5 MG immediate release tablet, Take 0.5 tablets (2.5 mg total) by mouth 2 (two) times daily., Disp: 30 tablet, Rfl: 0 .  pantoprazole (PROTONIX) 40 MG tablet, Take 40 mg by mouth daily., Disp: , Rfl:  .  predniSONE (DELTASONE) 10 MG tablet, Take 6 tabs day 1, 5 tabs day 2, 4 tabs day 3, 3 tabs day 4, 2 tabs day 5, 1 tab day 6, Disp: 21 tablet, Rfl: 0 .  pregabalin (LYRICA) 150 MG capsule, Take 1 capsule (150 mg total) by  mouth 2 (two) times daily., Disp: 60 capsule, Rfl: 0 .  Vitamin D, Ergocalciferol, (DRISDOL) 50000 units CAPS capsule, Take 50,000 Units by mouth every 7 (seven) days. Takes on Monday, Disp: , Rfl:  .  acetaminophen (TYLENOL) 325 MG tablet, Take 2 tablets (650 mg total) by mouth every 6 (six) hours as needed for mild pain (or Fever >/= 101). (Patient not taking: Reported on 03/13/2018), Disp: , Rfl:  .  amoxicillin-clavulanate (AUGMENTIN) 875-125 MG tablet, Take 1 tablet by mouth 2 (two) times daily., Disp: 14 tablet, Rfl: 0 .  promethazine (PHENERGAN) 25 MG tablet, , Disp: , Rfl: 0 No current facility-administered medications for this visit.   Facility-Administered Medications Ordered in Other Visits:  .  denosumab (XGEVA) injection 120 mg, 120 mg, Subcutaneous, Q28 days, Sindy Guadeloupe, MD, 120 mg at 03/13/18 1019  Physical exam:  Vitals:   03/13/18 0944  BP: 124/86  Pulse: 65  Resp: 18  Temp: 97.6 F (36.4 C)  TempSrc: Tympanic  SpO2: 98%  Weight: 155 lb 14.4 oz (70.7 kg)  Height: '5\' 7"'  (1.702 m)   Physical Exam  Constitutional: He is oriented to person, place, and time.  Thin man in no acute distress  HENT:  Head: Normocephalic and atraumatic.  Mouth/Throat: Oropharynx is clear and moist.  Eyes: Pupils are equal, round, and reactive to  light. EOM are normal.  Neck: Normal range of motion.  Cardiovascular: Normal rate, regular rhythm and normal heart sounds.  Pulmonary/Chest: Effort normal and breath sounds normal.  Abdominal: Soft. Bowel sounds are normal.  Neurological: He is alert and oriented to person, place, and time.  Skin: Skin is warm and dry.     CMP Latest Ref Rng & Units 03/13/2018  Glucose 70 - 99 mg/dL 80  BUN 6 - 20 mg/dL 15  Creatinine 0.61 - 1.24 mg/dL 1.54(H)  Sodium 135 - 145 mmol/L 137  Potassium 3.5 - 5.1 mmol/L 3.8  Chloride 98 - 111 mmol/L 104  CO2 22 - 32 mmol/L 30  Calcium 8.9 - 10.3 mg/dL 8.6(L)  Total Protein 6.5 - 8.1 g/dL 6.6  Total Bilirubin 0.3 - 1.2 mg/dL 0.7  Alkaline Phos 38 - 126 U/L 69  AST 15 - 41 U/L 25  ALT 0 - 44 U/L 25   CBC Latest Ref Rng & Units 03/13/2018  WBC 4.0 - 10.5 K/uL 2.8(L)  Hemoglobin 13.0 - 17.0 g/dL 13.2  Hematocrit 39.0 - 52.0 % 39.1  Platelets 150 - 400 K/uL 140(L)      Assessment and plan- Patient is a 47 y.o. male with kappa light chain multiple myeloma status post RVD chemotherapy followed by autologous stem cell transplantation in August 2018 currently in remission and on maintenance Revlimid. He is here for routine follow-up of his multiple myeloma  1.  Patient reports nasal congestion as well as one episode of fever yesterday along with mild productive cough.  I will therefore start him on Augmentin 875 mg p.o. twice daily for 7 days.  He will call if his symptoms do not improve or get worse  2.  His white count is lower at 2.8 today with an ANC of 1.4.  I have therefore asked him to hold his Revlimid at this time.  We will repeat his CBC with differential on 03/23/2018 and if his white count has improved by then he can restart Revlimid at that time.  3.  He will get Xgeva today and Xgeva in 4 weeks.  I will see  him back in 8 weeks for Xgeva we will check CBC, CMP, myeloma panel and serum free light chains each month  4.  patient will be due for  a second set of post transplant vaccinations next month which we will coordinate as well   Visit Diagnosis 1. Multiple myeloma in remission ([place])   2. High risk medication use   3. Upper respiratory tract infection, unspecified type      Dr. Randa Evens, MD, MPH [facility] [phone] 03/13/2018 1:10 PM

## 2018-03-16 LAB — KAPPA/LAMBDA LIGHT CHAINS
KAPPA FREE LGHT CHN: 31.9 mg/L — AB (ref 3.3–19.4)
KAPPA, LAMDA LIGHT CHAIN RATIO: 1.53 (ref 0.26–1.65)
LAMDA FREE LIGHT CHAINS: 20.9 mg/L (ref 5.7–26.3)

## 2018-03-17 ENCOUNTER — Encounter: Payer: No Typology Code available for payment source | Admitting: Physical Therapy

## 2018-03-17 LAB — MULTIPLE MYELOMA PANEL, SERUM
ALBUMIN/GLOB SERPL: 1.3 (ref 0.7–1.7)
Albumin SerPl Elph-Mcnc: 3.4 g/dL (ref 2.9–4.4)
Alpha 1: 0.2 g/dL (ref 0.0–0.4)
Alpha2 Glob SerPl Elph-Mcnc: 0.8 g/dL (ref 0.4–1.0)
B-Globulin SerPl Elph-Mcnc: 0.9 g/dL (ref 0.7–1.3)
Gamma Glob SerPl Elph-Mcnc: 0.8 g/dL (ref 0.4–1.8)
Globulin, Total: 2.7 g/dL (ref 2.2–3.9)
IGG (IMMUNOGLOBIN G), SERUM: 951 mg/dL (ref 700–1600)
IGM (IMMUNOGLOBULIN M), SRM: 61 mg/dL (ref 20–172)
IgA: 196 mg/dL (ref 90–386)
Total Protein ELP: 6.1 g/dL (ref 6.0–8.5)

## 2018-03-23 ENCOUNTER — Telehealth: Payer: Self-pay | Admitting: *Deleted

## 2018-03-23 ENCOUNTER — Inpatient Hospital Stay: Payer: No Typology Code available for payment source | Attending: Oncology

## 2018-03-23 ENCOUNTER — Other Ambulatory Visit: Payer: Self-pay

## 2018-03-23 DIAGNOSIS — Z23 Encounter for immunization: Secondary | ICD-10-CM | POA: Diagnosis present

## 2018-03-23 DIAGNOSIS — C9001 Multiple myeloma in remission: Secondary | ICD-10-CM | POA: Diagnosis present

## 2018-03-23 LAB — CBC WITH DIFFERENTIAL/PLATELET
Abs Immature Granulocytes: 0.01 10*3/uL (ref 0.00–0.07)
BASOS ABS: 0.1 10*3/uL (ref 0.0–0.1)
Basophils Relative: 2 %
EOS ABS: 0.3 10*3/uL (ref 0.0–0.5)
EOS PCT: 9 %
HCT: 39.1 % (ref 39.0–52.0)
Hemoglobin: 13.2 g/dL (ref 13.0–17.0)
IMMATURE GRANULOCYTES: 0 %
Lymphocytes Relative: 22 %
Lymphs Abs: 0.7 10*3/uL (ref 0.7–4.0)
MCH: 33.2 pg (ref 26.0–34.0)
MCHC: 33.8 g/dL (ref 30.0–36.0)
MCV: 98.5 fL (ref 80.0–100.0)
Monocytes Absolute: 0.3 10*3/uL (ref 0.1–1.0)
Monocytes Relative: 9 %
NEUTROS PCT: 58 %
NRBC: 0 % (ref 0.0–0.2)
Neutro Abs: 1.8 10*3/uL (ref 1.7–7.7)
Platelets: 144 10*3/uL — ABNORMAL LOW (ref 150–400)
RBC: 3.97 MIL/uL — ABNORMAL LOW (ref 4.22–5.81)
RDW: 14.8 % (ref 11.5–15.5)
WBC: 3.2 10*3/uL — AB (ref 4.0–10.5)

## 2018-03-23 NOTE — Telephone Encounter (Signed)
Called and left message that patient wbc came up and neutrophils are normal so he needs to start back on his revlimid today and can call me back if he has questions

## 2018-03-23 NOTE — Telephone Encounter (Signed)
-----   Message from Sindy Guadeloupe, MD sent at 03/23/2018  9:19 AM EST ----- Please ask patient to restart his revlimid at this time. Thanks, Astrid Divine

## 2018-03-24 ENCOUNTER — Other Ambulatory Visit: Payer: Self-pay | Admitting: *Deleted

## 2018-03-24 ENCOUNTER — Encounter: Payer: Self-pay | Admitting: Pain Medicine

## 2018-03-24 ENCOUNTER — Ambulatory Visit
Admission: RE | Admit: 2018-03-24 | Discharge: 2018-03-24 | Disposition: A | Discharge status: Home / Self Care | Payer: No Typology Code available for payment source | Source: Ambulatory Visit | Attending: Pain Medicine | Admitting: Pain Medicine

## 2018-03-24 ENCOUNTER — Ambulatory Visit (HOSPITAL_BASED_OUTPATIENT_CLINIC_OR_DEPARTMENT_OTHER): Payer: No Typology Code available for payment source | Admitting: Pain Medicine

## 2018-03-24 ENCOUNTER — Other Ambulatory Visit: Payer: Self-pay

## 2018-03-24 VITALS — BP 143/89 | HR 64 | Temp 97.3°F | Resp 20 | Ht 67.0 in | Wt 155.0 lb

## 2018-03-24 DIAGNOSIS — M549 Dorsalgia, unspecified: Secondary | ICD-10-CM | POA: Diagnosis present

## 2018-03-24 DIAGNOSIS — C9001 Multiple myeloma in remission: Secondary | ICD-10-CM

## 2018-03-24 DIAGNOSIS — G8929 Other chronic pain: Secondary | ICD-10-CM

## 2018-03-24 DIAGNOSIS — M545 Low back pain, unspecified: Secondary | ICD-10-CM

## 2018-03-24 DIAGNOSIS — M47816 Spondylosis without myelopathy or radiculopathy, lumbar region: Secondary | ICD-10-CM | POA: Insufficient documentation

## 2018-03-24 MED ORDER — FENTANYL CITRATE (PF) 100 MCG/2ML IJ SOLN
INTRAMUSCULAR | Status: AC
  Filled 2018-03-24: qty 2

## 2018-03-24 MED ORDER — LIDOCAINE HCL 2 % IJ SOLN
INTRAMUSCULAR | Status: AC
  Filled 2018-03-24: qty 20

## 2018-03-24 MED ORDER — MIDAZOLAM HCL 5 MG/5ML IJ SOLN
INTRAMUSCULAR | Status: AC
  Filled 2018-03-24: qty 5

## 2018-03-24 MED ORDER — FENTANYL CITRATE (PF) 100 MCG/2ML IJ SOLN
25.0000 ug | INTRAMUSCULAR | Status: DC | PRN
  Administered 2018-03-24: 100 ug via INTRAVENOUS

## 2018-03-24 MED ORDER — MIDAZOLAM HCL 5 MG/5ML IJ SOLN
1.0000 mg | INTRAMUSCULAR | Status: DC | PRN
  Administered 2018-03-24: 3 mg via INTRAVENOUS

## 2018-03-24 MED ORDER — ROPIVACAINE HCL 2 MG/ML IJ SOLN
18.0000 mL | Freq: Once | INTRAMUSCULAR | Status: AC
  Administered 2018-03-24: 18 mL via PERINEURAL

## 2018-03-24 MED ORDER — LENALIDOMIDE 5 MG PO CAPS: ORAL_CAPSULE | ORAL | 3 refills | Status: DC

## 2018-03-24 MED ORDER — ROPIVACAINE HCL 2 MG/ML IJ SOLN
INTRAMUSCULAR | Status: AC
  Filled 2018-03-24: qty 20

## 2018-03-24 MED ORDER — TRIAMCINOLONE ACETONIDE 40 MG/ML IJ SUSP
80.0000 mg | Freq: Once | INTRAMUSCULAR | Status: AC
  Administered 2018-03-24: 80 mg

## 2018-03-24 MED ORDER — LIDOCAINE HCL 2 % IJ SOLN
20.0000 mL | Freq: Once | INTRAMUSCULAR | Status: AC
  Administered 2018-03-24: 400 mg

## 2018-03-24 MED ORDER — TRIAMCINOLONE ACETONIDE 40 MG/ML IJ SUSP
INTRAMUSCULAR | Status: AC
  Filled 2018-03-24: qty 2

## 2018-03-24 MED ORDER — LACTATED RINGERS IV SOLN
1000.0000 mL | Freq: Once | INTRAVENOUS | Status: AC
  Administered 2018-03-24: 1000 mL via INTRAVENOUS

## 2018-03-24 NOTE — Patient Instructions (Addendum)
____________________________________________________________________________________________  Post-Procedure Discharge Instructions  Instructions:  Apply ice: Fill a plastic sandwich bag with crushed ice. Cover it with a small towel and apply to injection site. Apply for 15 minutes then remove x 15 minutes. Repeat sequence on day of procedure, until you go to bed. The purpose is to minimize swelling and discomfort after procedure.  Apply heat: Apply heat to procedure site starting the day following the procedure. The purpose is to treat any soreness and discomfort from the procedure.  Food intake: Start with clear liquids (like water) and advance to regular food, as tolerated.   Physical activities: Keep activities to a minimum for the first 8 hours after the procedure.   Driving: If you have received any sedation, you are not allowed to drive for 24 hours after your procedure.  Blood thinner: Restart your blood thinner 6 hours after your procedure. (Only for those taking blood thinners)  Insulin: As soon as you can eat, you may resume your normal dosing schedule. (Only for those taking insulin)  Infection prevention: Keep procedure site clean and dry.  Post-procedure Pain Diary: Extremely important that this be done correctly and accurately. Recorded information will be used to determine the next step in treatment.  Pain evaluated is that of treated area only. Do not include pain from an untreated area.  Complete every hour, on the hour, for the initial 8 hours. Set an alarm to help you do this part accurately.  Do not go to sleep and have it completed later. It will not be accurate.  Follow-up appointment: Keep your follow-up appointment after the procedure. Usually 2 weeks for most procedures. (6 weeks in the case of radiofrequency.) Bring you pain diary.   Expect:  From numbing medicine (AKA: Local Anesthetics): Numbness or decrease in pain.  Onset: Full effect within 15  minutes of injected.  Duration: It will depend on the type of local anesthetic used. On the average, 1 to 8 hours.   From steroids: Decrease in swelling or inflammation. Once inflammation is improved, relief of the pain will follow.  Onset of benefits: Depends on the amount of swelling present. The more swelling, the longer it will take for the benefits to be seen. In some cases, up to 10 days.  Duration: Steroids will stay in the system x 2 weeks. Duration of benefits will depend on multiple posibilities including persistent irritating factors.  Occasional side-effects: Facial flushing (red, warm cheeks) , cramps (if present, drink Gatorade and take over-the-counter Magnesium 450-500 mg once to twice a day).  From procedure: Some discomfort is to be expected once the numbing medicine wears off. This should be minimal if ice and heat are applied as instructed.  Call if:  You experience numbness and weakness that gets worse with time, as opposed to wearing off.  New onset bowel or bladder incontinence. (This applies to Spinal procedures only)  Emergency Numbers:  Durning business hours (Monday - Thursday, 8:00 AM - 4:00 PM) (Friday, 9:00 AM - 12:00 Noon): [phone]  After hours: [phone] ____________________________________________________________________________________________    ______________________________________________________________________________________________  Specialty Pain Scale  Introduction:  There are significant differences in how pain is reported. The word pain usually refers to physical pain, but it is also a common synonym of suffering. The medical community uses a scale from 0 (zero) to 10 (ten) to report pain level. Zero (0) is described as "no pain", while ten (10) is described as "the worse pain you can imagine". The problem with this   scale is that physical pain is reported along with suffering. Suffering refers to mental pain, or more often  yet it refers to any unpleasant feeling, emotion or aversion associated with the perception of harm or threat of harm. It is the psychological component of pain.  Pain Specialists prefer to separate the two components. The pain scale used by this practice is the Verbal Numerical Rating Scale (VNRS-11). This scale is for the physical pain only. DO NOT INCLUDE how your pain psychologically affects you. This scale is for adults 21 years of age and older. It has 11 (eleven) levels. The 1st level is 0/10. This means: "right now, I have no pain". In the context of pain management, it also means: "right now, my physical pain is under control with the current therapy".  General Information:  The scale should reflect your current level of pain. Unless you are specifically asked for the level of your worst pain, or your average pain. If you are asked for one of these two, then it should be understood that it is over the past 24 hours.  Levels 1 (one) through 5 (five) are described below, and can be treated as an outpatient. Ambulatory pain management facilities such as ours are more than adequate to treat these levels. Levels 6 (six) through 10 (ten) are also described below, however, these must be treated as a hospitalized patient. While levels 6 (six) and 7 (seven) may be evaluated at an urgent care facility, levels 8 (eight) through 10 (ten) constitute medical emergencies and as such, they belong in a hospital's emergency department. When having these levels (as described below), do not come to our office. Our facility is not equipped to manage these levels. Go directly to an urgent care facility or an emergency department to be evaluated.  Definitions:  Activities of Daily Living (ADL): Activities of daily living (ADL or ADLs) is a term used in healthcare to refer to people's daily self-care activities. Health professionals often use a person's ability or inability to perform ADLs as a measurement of their  functional status, particularly in regard to people post injury, with disabilities and the elderly. There are two ADL levels: Basic and Instrumental. Basic Activities of Daily Living (BADL  or BADLs) consist of self-care tasks that include: Bathing and showering; personal hygiene and grooming (including brushing/combing/styling hair); dressing; Toilet hygiene (getting to the toilet, cleaning oneself, and getting back up); eating and self-feeding (not including cooking or chewing and swallowing); functional mobility, often referred to as "transferring", as measured by the ability to walk, get in and out of bed, and get into and out of a chair; the broader definition (moving from one place to another while performing activities) is useful for people with different physical abilities who are still able to get around independently. Basic ADLs include the things many people do when they get up in the morning and get ready to go out of the house: get out of bed, go to the toilet, bathe, dress, groom, and eat. On the average, loss of function typically follows a particular order. Hygiene is the first to go, followed by loss of toilet use and locomotion. The last to go is the ability to eat. When there is only one remaining area in which the person is independent, there is a 62.9% chance that it is eating and only a 3.5% chance that it is hygiene. Instrumental Activities of Daily Living (IADL or IADLs) are not necessary for fundamental functioning, but they let an   individual live independently in a community. IADL consist of tasks that include: cleaning and maintaining the house; home establishment and maintenance; care of others (including selecting and supervising caregivers); care of pets; child rearing; managing money; managing financials (investments, etc.); meal preparation and cleanup; shopping for groceries and necessities; moving within the community; safety procedures and emergency responses; health management  and maintenance (taking prescribed medications); and using the telephone or other form of communication.  Instructions:  Most patients tend to report their pain as a combination of two factors, their physical pain and their psychosocial pain. This last one is also known as "suffering" and it is reflection of how physical pain affects you socially and psychologically. From now on, report them separately.  From this point on, when asked to report your pain level, report only your physical pain. Use the following table for reference.  Pain Clinic Pain Levels (0-5/10)  Pain Level Score  Description  No Pain 0   Mild pain 1 Nagging, annoying, but does not interfere with basic activities of daily living (ADL). Patients are able to eat, bathe, get dressed, toileting (being able to get on and off the toilet and perform personal hygiene functions), transfer (move in and out of bed or a chair without assistance), and maintain continence (able to control bladder and bowel functions). Blood pressure and heart rate are unaffected. A normal heart rate for a healthy adult ranges from 60 to 100 bpm (beats per minute).   Mild to moderate pain 2 Noticeable and distracting. Impossible to hide from other people. More frequent flare-ups. Still possible to adapt and function close to normal. It can be very annoying and may have occasional stronger flare-ups. With discipline, patients may get used to it and adapt.   Moderate pain 3 Interferes significantly with activities of daily living (ADL). It becomes difficult to feed, bathe, get dressed, get on and off the toilet or to perform personal hygiene functions. Difficult to get in and out of bed or a chair without assistance. Very distracting. With effort, it can be ignored when deeply involved in activities.   Moderately severe pain 4 Impossible to ignore for more than a few minutes. With effort, patients may still be able to manage work or participate in some social  activities. Very difficult to concentrate. Signs of autonomic nervous system discharge are evident: dilated pupils (mydriasis); mild sweating (diaphoresis); sleep interference. Heart rate becomes elevated (>115 bpm). Diastolic blood pressure (lower number) rises above 100 mmHg. Patients find relief in laying down and not moving.   Severe pain 5 Intense and extremely unpleasant. Associated with frowning face and frequent crying. Pain overwhelms the senses.  Ability to do any activity or maintain social relationships becomes significantly limited. Conversation becomes difficult. Pacing back and forth is common, as getting into a comfortable position is nearly impossible. Pain wakes you up from deep sleep. Physical signs will be obvious: pupillary dilation; increased sweating; goosebumps; brisk reflexes; cold, clammy hands and feet; nausea, vomiting or dry heaves; loss of appetite; significant sleep disturbance with inability to fall asleep or to remain asleep. When persistent, significant weight loss is observed due to the complete loss of appetite and sleep deprivation.  Blood pressure and heart rate becomes significantly elevated. Caution: If elevated blood pressure triggers a pounding headache associated with blurred vision, then the patient should immediately seek attention at an urgent or emergency care unit, as these may be signs of an impending stroke.    Emergency Department Pain Levels (6-10/10)    Emergency Room Pain 6 Severely limiting. Requires emergency care and should not be seen or managed at an outpatient pain management facility. Communication becomes difficult and requires great effort. Assistance to reach the emergency department may be required. Facial flushing and profuse sweating along with potentially dangerous increases in heart rate and blood pressure will be evident.   Distressing pain 7 Self-care is very difficult. Assistance is required to transport, or use restroom. Assistance to  reach the emergency department will be required. Tasks requiring coordination, such as bathing and getting dressed become very difficult.   Disabling pain 8 Self-care is no longer possible. At this level, pain is disabling. The individual is unable to do even the most "basic" activities such as walking, eating, bathing, dressing, transferring to a bed, or toileting. Fine motor skills are lost. It is difficult to think clearly.   Incapacitating pain 9 Pain becomes incapacitating. Thought processing is no longer possible. Difficult to remember your own name. Control of movement and coordination are lost.   The worst pain imaginable 10 At this level, most patients pass out from pain. When this level is reached, collapse of the autonomic nervous system occurs, leading to a sudden drop in blood pressure and heart rate. This in turn results in a temporary and dramatic drop in blood flow to the brain, leading to a loss of consciousness. Fainting is one of the body's self defense mechanisms. Passing out puts the brain in a calmed state and causes it to shut down for a while, in order to begin the healing process.    Summary: 1. Refer to this scale when providing us with your pain level. 2. Be accurate and careful when reporting your pain level. This will help with your care. 3. Over-reporting your pain level will lead to loss of credibility. 4. Even a level of 1/10 means that there is pain and will be treated at our facility. 5. High, inaccurate reporting will be documented as "Symptom Exaggeration", leading to loss of credibility and suspicions of possible secondary gains such as obtaining more narcotics, or wanting to appear disabled, for fraudulent reasons. 6. Only pain levels of 5 or below will be seen at our facility. 7. Pain levels of 6 and above will be sent to the Emergency Department and the appointment  cancelled. ______________________________________________________________________________________________    

## 2018-03-24 NOTE — Progress Notes (Signed)
Safety precautions to be maintained throughout the outpatient stay will include: orient to surroundings, keep bed in low position, maintain call bell within reach at all times, provide assistance with transfer out of bed and ambulation.  

## 2018-03-24 NOTE — Progress Notes (Signed)
Patient's Name: James House.  MRN: [id number]  Referring Provider: Pleas Koch, NP  DOB: 1970/06/17  PCP: Pleas Koch, NP  DOS: 03/24/2018  Note by: Gaspar Cola, MD  Service setting: Ambulatory outpatient  Specialty: Interventional Pain Management  Patient type: Established  Location: [facility] (AMB) Pain Management Facility  Visit type: Interventional Procedure   Primary Reason for Visit: Interventional Pain Management Treatment. CC: Back Pain (lower)  Procedure:          Anesthesia, Analgesia, Anxiolysis:  Type: Lumbar Facet, Medial Branch Block(s) #1  Primary Purpose: Diagnostic Region: Posterolateral Lumbosacral Spine Level: L2, L3, L4, L5, & S1 Medial Branch Level(s). Injecting these levels blocks the L3-4, L4-5, and L5-S1 lumbar facet joints. Laterality: Bilateral  Type: Moderate (Conscious) Sedation combined with Local Anesthesia Indication(s): Analgesia and Anxiety Route: Intravenous (IV) IV Access: Secured Sedation: Meaningful verbal contact was maintained at all times during the procedure  Local Anesthetic: Lidocaine 1-2%  Position: Prone   Indications: 1. Spondylosis without myelopathy or radiculopathy, lumbar region   2. Lumbar facet syndrome (Bilateral) (L>R)   3. Chronic low back pain (Primary Area of Pain) (Bilateral) (L>R) w/o sciatica    Pain Score: Pre-procedure: 7 /10 Post-procedure: 0-No pain/10  Pre-op Assessment:  James House is a 47 y.o. (year old), male patient, seen today for interventional treatment. He  has a past surgical history that includes Joint replacement; Knee surgery (Left); and Abdominal surgery. James House has a current medication list which includes the following prescription(s): acetaminophen, acyclovir, amoxicillin-clavulanate, aspirin ec, baclofen, gabapentin, lenalidomide, oxycodone, pantoprazole, prednisone, pregabalin, promethazine, and vitamin d (ergocalciferol), and the following Facility-Administered Medications: fentanyl  and midazolam. His primarily concern today is the Back Pain (lower)  Initial Vital Signs:  Pulse/HCG Rate: 64ECG Heart Rate: (!) 57 Temp: 98.1 F (36.7 C) Resp: 16 BP: 124/87 SpO2: 100 %  BMI: Estimated body mass index is 24.28 kg/m as calculated from the following:   Height as of this encounter: 5\' 7"  (1.702 m).   Weight as of this encounter: 155 lb (70.3 kg).  Risk Assessment: Allergies: Reviewed. He has No Known Allergies.  Allergy Precautions: None required Coagulopathies: Reviewed. None identified.  Blood-thinner therapy: None at this time Active Infection(s): Reviewed. None identified. James House is afebrile  Site Confirmation: James House was asked to confirm the procedure and laterality before marking the site Procedure checklist: Completed Consent: Before the procedure and under the influence of no sedative(s), amnesic(s), or anxiolytics, the patient was informed of the treatment options, risks and possible complications. To fulfill our ethical and legal obligations, as recommended by the American Medical Association's Code of Ethics, I have informed the patient of my clinical impression; the nature and purpose of the treatment or procedure; the risks, benefits, and possible complications of the intervention; the alternatives, including doing nothing; the risk(s) and benefit(s) of the alternative treatment(s) or procedure(s); and the risk(s) and benefit(s) of doing nothing. The patient was provided information about the general risks and possible complications associated with the procedure. These may include, but are not limited to: failure to achieve desired goals, infection, bleeding, organ or nerve damage, allergic reactions, paralysis, and death. In addition, the patient was informed of those risks and complications associated to Spine-related procedures, such as failure to decrease pain; infection (i.e.: Meningitis, epidural or intraspinal abscess); bleeding (i.e.: epidural  hematoma, subarachnoid hemorrhage, or any other type of intraspinal or peri-dural bleeding); organ or nerve damage (i.e.: Any type of peripheral nerve, nerve root,  or spinal cord injury) with subsequent damage to sensory, motor, and/or autonomic systems, resulting in permanent pain, numbness, and/or weakness of one or several areas of the body; allergic reactions; (i.e.: anaphylactic reaction); and/or death. Furthermore, the patient was informed of those risks and complications associated with the medications. These include, but are not limited to: allergic reactions (i.e.: anaphylactic or anaphylactoid reaction(s)); adrenal axis suppression; blood sugar elevation that in diabetics may result in ketoacidosis or comma; water retention that in patients with history of congestive heart failure may result in shortness of breath, pulmonary edema, and decompensation with resultant heart failure; weight gain; swelling or edema; medication-induced neural toxicity; particulate matter embolism and blood vessel occlusion with resultant organ, and/or nervous system infarction; and/or aseptic necrosis of one or more joints. Finally, the patient was informed that Medicine is not an exact science; therefore, there is also the possibility of unforeseen or unpredictable risks and/or possible complications that may result in a catastrophic outcome. The patient indicated having understood very clearly. We have given the patient no guarantees and we have made no promises. Enough time was given to the patient to ask questions, all of which were answered to the patient's satisfaction. James House has indicated that he wanted to continue with the procedure. Attestation: I, the ordering provider, attest that I have discussed with the patient the benefits, risks, side-effects, alternatives, likelihood of achieving goals, and potential problems during recovery for the procedure that I have provided informed consent. Date  Time: 03/24/2018   9:17 AM  Pre-Procedure Preparation:  Monitoring: As per clinic protocol. Respiration, ETCO2, SpO2, BP, heart rate and rhythm monitor placed and checked for adequate function Safety Precautions: Patient was assessed for positional comfort and pressure points before starting the procedure. Time-out: I initiated and conducted the "Time-out" before starting the procedure, as per protocol. The patient was asked to participate by confirming the accuracy of the "Time Out" information. Verification of the correct person, site, and procedure were performed and confirmed by me, the nursing staff, and the patient. "Time-out" conducted as per Joint Commission's Universal Protocol (UP.01.01.01). Time: 1000  Description of Procedure:          Laterality: Bilateral. The procedure was performed in identical fashion on both sides. Levels:  L2, L3, L4, L5, & S1 Medial Branch Level(s) Area Prepped: Posterior Lumbosacral Region Prepping solution: ChloraPrep (2% chlorhexidine gluconate and 70% isopropyl alcohol) Safety Precautions: Aspiration looking for blood return was conducted prior to all injections. At no point did we inject any substances, as a needle was being advanced. Before injecting, the patient was told to immediately notify me if he was experiencing any new onset of "ringing in the ears, or metallic taste in the mouth". No attempts were made at seeking any paresthesias. Safe injection practices and needle disposal techniques used. Medications properly checked for expiration dates. SDV (single dose vial) medications used. After the completion of the procedure, all disposable equipment used was discarded in the proper designated medical waste containers. Local Anesthesia: Protocol guidelines were followed. The patient was positioned over the fluoroscopy table. The area was prepped in the usual manner. The time-out was completed. The target area was identified using fluoroscopy. A 12-in long, straight, sterile  hemostat was used with fluoroscopic guidance to locate the targets for each level blocked. Once located, the skin was marked with an approved surgical skin marker. Once all sites were marked, the skin (epidermis, dermis, and hypodermis), as well as deeper tissues (fat, connective tissue and muscle) were  infiltrated with a small amount of a short-acting local anesthetic, loaded on a 10cc syringe with a 25G, 1.5-in  Needle. An appropriate amount of time was allowed for local anesthetics to take effect before proceeding to the next step. Local Anesthetic: Lidocaine 2.0% The unused portion of the local anesthetic was discarded in the proper designated containers. Technical explanation of process:  L2 Medial Branch Nerve Block (MBB): The target area for the L2 medial branch is at the junction of the postero-lateral aspect of the superior articular process and the superior, posterior, and medial edge of the transverse process of L3. Under fluoroscopic guidance, a Quincke needle was inserted until contact was made with os over the superior postero-lateral aspect of the pedicular shadow (target area). After negative aspiration for blood, 0.5 mL of the nerve block solution was injected without difficulty or complication. The needle was removed intact. L3 Medial Branch Nerve Block (MBB): The target area for the L3 medial branch is at the junction of the postero-lateral aspect of the superior articular process and the superior, posterior, and medial edge of the transverse process of L4. Under fluoroscopic guidance, a Quincke needle was inserted until contact was made with os over the superior postero-lateral aspect of the pedicular shadow (target area). After negative aspiration for blood, 0.5 mL of the nerve block solution was injected without difficulty or complication. The needle was removed intact. L4 Medial Branch Nerve Block (MBB): The target area for the L4 medial branch is at the junction of the postero-lateral  aspect of the superior articular process and the superior, posterior, and medial edge of the transverse process of L5. Under fluoroscopic guidance, a Quincke needle was inserted until contact was made with os over the superior postero-lateral aspect of the pedicular shadow (target area). After negative aspiration for blood, 0.5 mL of the nerve block solution was injected without difficulty or complication. The needle was removed intact. L5 Medial Branch Nerve Block (MBB): The target area for the L5 medial branch is at the junction of the postero-lateral aspect of the superior articular process and the superior, posterior, and medial edge of the sacral ala. Under fluoroscopic guidance, a Quincke needle was inserted until contact was made with os over the superior postero-lateral aspect of the pedicular shadow (target area). After negative aspiration for blood, 0.5 mL of the nerve block solution was injected without difficulty or complication. The needle was removed intact. S1 Medial Branch Nerve Block (MBB): The target area for the S1 medial branch is at the posterior and inferior 6 o'clock position of the L5-S1 facet joint. Under fluoroscopic guidance, the Quincke needle inserted for the L5 MBB was redirected until contact was made with os over the inferior and postero aspect of the sacrum, at the 6 o' clock position under the L5-S1 facet joint (Target area). After negative aspiration for blood, 0.5 mL of the nerve block solution was injected without difficulty or complication. The needle was removed intact. Procedural Needles: 22-gauge, 3.5-inch, Quincke needles used for all levels. Nerve block solution: 0.2% PF-Ropivacaine + Triamcinolone (40 mg/mL) diluted to a final concentration of 4 mg of Triamcinolone/mL of Ropivacaine The unused portion of the solution was discarded in the proper designated containers.  Once the entire procedure was completed, the treated area was cleaned, making sure to leave some of  the prepping solution back to take advantage of its long term bactericidal properties.   Illustration of the posterior view of the lumbar spine and the posterior neural structures. Laminae of  L2 through S1 are labeled. DPRL5, dorsal primary ramus of L5; DPRS1, dorsal primary ramus of S1; DPR3, dorsal primary ramus of L3; FJ, facet (zygapophyseal) joint L3-L4; I, inferior articular process of L4; LB1, lateral branch of dorsal primary ramus of L1; IAB, inferior articular branches from L3 medial branch (supplies L4-L5 facet joint); IBP, intermediate branch plexus; MB3, medial branch of dorsal primary ramus of L3; NR3, third lumbar nerve root; S, superior articular process of L5; SAB, superior articular branches from L4 (supplies L4-5 facet joint also); TP3, transverse process of L3.  Vitals:   03/24/18 1014 03/24/18 1024 03/24/18 1034 03/24/18 1044  BP: 109/74 (!) 139/100 (!) 134/101 (!) 143/89  Pulse:      Resp: (!) 8 16 (!) 22 20  Temp:  (!) 97.4 F (36.3 C)  (!) 97.3 F (36.3 C)  TempSrc:      SpO2: 94% 100% 100% 100%  Weight:      Height:         Start Time: 1000 hrs. End Time: 1013 hrs.  Imaging Guidance (Spinal):          Type of Imaging Technique: Fluoroscopy Guidance (Spinal) Indication(s): Assistance in needle guidance and placement for procedures requiring needle placement in or near specific anatomical locations not easily accessible without such assistance. Exposure Time: Please see nurses notes. Contrast: None used. Fluoroscopic Guidance: I was personally present during the use of fluoroscopy. "Tunnel Vision Technique" used to obtain the best possible view of the target area. Parallax error corrected before commencing the procedure. "Direction-depth-direction" technique used to introduce the needle under continuous pulsed fluoroscopy. Once target was reached, antero-posterior, oblique, and lateral fluoroscopic projection used confirm needle placement in all planes. Images  permanently stored in EMR. Interpretation: No contrast injected. I personally interpreted the imaging intraoperatively. Adequate needle placement confirmed in multiple planes. Permanent images saved into the patient's record.  Antibiotic Prophylaxis:   Anti-infectives (From admission, onward)   None     Indication(s): None identified  Post-operative Assessment:  Post-procedure Vital Signs:  Pulse/HCG Rate: 6471 Temp: (!) 97.3 F (36.3 C) Resp: 20 BP: (!) 143/89 SpO2: 100 %  EBL: None  Complications: No immediate post-treatment complications observed by team, or reported by patient.  Note: The patient tolerated the entire procedure well. A repeat set of vitals were taken after the procedure and the patient was kept under observation following institutional policy, for this type of procedure. Post-procedural neurological assessment was performed, showing return to baseline, prior to discharge. The patient was provided with post-procedure discharge instructions, including a section on how to identify potential problems. Should any problems arise concerning this procedure, the patient was given instructions to immediately contact us, at any time, without hesitation. In any case, we plan to contact the patient by telephone for a follow-up status report regarding this interventional procedure.  Comments:  No additional relevant information.  Plan of Care    Imaging Orders     DG C-Arm 1-60 Min-No Report  Procedure Orders     LUMBAR FACET(MEDIAL BRANCH NERVE BLOCK) MBNB  Medications ordered for procedure: Meds ordered this encounter  Medications  . lidocaine (XYLOCAINE) 2 % (with pres) injection 400 mg  . midazolam (VERSED) 5 MG/5ML injection 1-2 mg    Make sure Flumazenil is available in the pyxis when using this medication. If oversedation occurs, administer 0.2 mg IV over 15 sec. If after 45 sec no response, administer 0.2 mg again over 1 min; may repeat at 1 min intervals;  not  to exceed 4 doses (1 mg)  . fentaNYL (SUBLIMAZE) injection 25-50 mcg    Make sure Narcan is available in the pyxis when using this medication. In the event of respiratory depression (RR< 8/min): Titrate NARCAN (naloxone) in increments of 0.1 to 0.2 mg IV at 2-3 minute intervals, until desired degree of reversal.  . lactated ringers infusion 1,000 mL  . ropivacaine (PF) 2 mg/mL (0.2%) (NAROPIN) injection 18 mL  . triamcinolone acetonide (KENALOG-40) injection 80 mg   Medications administered: We administered lidocaine, midazolam, fentaNYL, lactated ringers, ropivacaine (PF) 2 mg/mL (0.2%), and triamcinolone acetonide.  See the medical record for exact dosing, route, and time of administration.  Disposition: Discharge home  Discharge Date & Time: 03/24/2018; 1048 hrs.   Physician-requested Follow-up: Return for post-procedure eval (2 wks), w/ Dr. Dossie Arbour.  Future Appointments  Date Time Provider [place]  03/31/2018  9:00 AM Vevelyn Francois, NP [facility] None  04/08/2018  8:15 AM Milinda Pointer, MD [facility] None  04/10/2018  9:30 AM [facility] None  04/10/2018 10:00 AM [facility] INJECTION [facility] None  05/25/2018 10:00 AM Stoioff, Ronda Fairly, MD [facility] None  06/12/2018  9:30 AM [facility] None  06/12/2018 10:00 AM Sindy Guadeloupe, MD [facility] None  06/12/2018 10:30 AM [facility] INJE[facility] None   Primary Care Physician: Pleas Koch, NP Location: [facility] Note by: Gaspar Cola, MD Date: 03/24/2018; Time: 11:37 AM  Disclaimer:  Medicine is not an exact science. The only guarantee in medicine is that nothing is guaranteed. It is important to note that the decision to proceed with this intervention was based on the information collected from the patient. The Data and conclusions were drawn from the patient's questionnaire, the interview, and the physical examination. Because the information was  provided in large part by the patient, it cannot be guaranteed that it has not been purposely or unconsciously manipulated. Every effort has been made to obtain as much relevant data as possible for this evaluation. It is important to note that the conclusions that lead to this procedure are derived in large part from the available data. Always take into account that the treatment will also be dependent on availability of resources and existing treatment guidelines, considered by other Pain Management Practitioners as being common knowledge and practice, at the time of the intervention. For Medico-Legal purposes, it is also important to point out that variation in procedural techniques and pharmacological choices are the acceptable norm. The indications, contraindications, technique, and results of the above procedure should only be interpreted and judged by a Board-Certified Interventional Pain Specialist with extensive familiarity and expertise in the same exact procedure and technique.

## 2018-03-25 ENCOUNTER — Telehealth: Payer: Self-pay | Admitting: *Deleted

## 2018-03-25 NOTE — Telephone Encounter (Signed)
Spoke with patient re; procedure on yesterday, states he didn't sleep very well last night after numbness wore off.  He is trying to take it easy today.  Encouraged patient to do that and use ice and pain medication.  Told him that steroids would kick after approx 2 -3 days and would stay in the system for approx 14 days.  Also, that the most important time was the first 6 hours and knowing if he was numb.  Patient verbalizes u/o information.

## 2018-03-31 ENCOUNTER — Encounter: Payer: No Typology Code available for payment source | Admitting: Nurse Practitioner

## 2018-03-31 ENCOUNTER — Telehealth: Payer: Self-pay | Admitting: Cardiovascular Disease

## 2018-03-31 NOTE — Telephone Encounter (Signed)
Received Disability Determination Services forms to be completed  Sent through interoffice mail to [facility]

## 2018-04-02 ENCOUNTER — Ambulatory Visit: Payer: No Typology Code available for payment source | Attending: Nurse Practitioner | Admitting: Nurse Practitioner

## 2018-04-02 ENCOUNTER — Other Ambulatory Visit: Payer: Self-pay

## 2018-04-02 ENCOUNTER — Encounter: Payer: Self-pay | Admitting: Nurse Practitioner

## 2018-04-02 VITALS — BP 155/96 | HR 90 | Temp 98.8°F | Resp 18 | Ht 67.0 in | Wt 149.0 lb

## 2018-04-02 DIAGNOSIS — Z7982 Long term (current) use of aspirin: Secondary | ICD-10-CM | POA: Insufficient documentation

## 2018-04-02 DIAGNOSIS — E538 Deficiency of other specified B group vitamins: Secondary | ICD-10-CM | POA: Insufficient documentation

## 2018-04-02 DIAGNOSIS — M47816 Spondylosis without myelopathy or radiculopathy, lumbar region: Secondary | ICD-10-CM | POA: Diagnosis not present

## 2018-04-02 DIAGNOSIS — I129 Hypertensive chronic kidney disease with stage 1 through stage 4 chronic kidney disease, or unspecified chronic kidney disease: Secondary | ICD-10-CM | POA: Insufficient documentation

## 2018-04-02 DIAGNOSIS — M792 Neuralgia and neuritis, unspecified: Secondary | ICD-10-CM

## 2018-04-02 DIAGNOSIS — M1711 Unilateral primary osteoarthritis, right knee: Secondary | ICD-10-CM | POA: Diagnosis not present

## 2018-04-02 DIAGNOSIS — C9000 Multiple myeloma not having achieved remission: Secondary | ICD-10-CM | POA: Diagnosis not present

## 2018-04-02 DIAGNOSIS — M7918 Myalgia, other site: Secondary | ICD-10-CM

## 2018-04-02 DIAGNOSIS — G894 Chronic pain syndrome: Secondary | ICD-10-CM | POA: Insufficient documentation

## 2018-04-02 DIAGNOSIS — G8929 Other chronic pain: Secondary | ICD-10-CM

## 2018-04-02 DIAGNOSIS — N183 Chronic kidney disease, stage 3 (moderate): Secondary | ICD-10-CM | POA: Insufficient documentation

## 2018-04-02 DIAGNOSIS — Z7952 Long term (current) use of systemic steroids: Secondary | ICD-10-CM | POA: Diagnosis not present

## 2018-04-02 DIAGNOSIS — Z79899 Other long term (current) drug therapy: Secondary | ICD-10-CM | POA: Diagnosis not present

## 2018-04-02 DIAGNOSIS — E871 Hypo-osmolality and hyponatremia: Secondary | ICD-10-CM | POA: Diagnosis not present

## 2018-04-02 DIAGNOSIS — M62830 Muscle spasm of back: Secondary | ICD-10-CM | POA: Diagnosis not present

## 2018-04-02 DIAGNOSIS — R700 Elevated erythrocyte sedimentation rate: Secondary | ICD-10-CM | POA: Insufficient documentation

## 2018-04-02 DIAGNOSIS — Z79891 Long term (current) use of opiate analgesic: Secondary | ICD-10-CM | POA: Diagnosis not present

## 2018-04-02 DIAGNOSIS — Z8249 Family history of ischemic heart disease and other diseases of the circulatory system: Secondary | ICD-10-CM | POA: Diagnosis not present

## 2018-04-02 DIAGNOSIS — Z833 Family history of diabetes mellitus: Secondary | ICD-10-CM | POA: Insufficient documentation

## 2018-04-02 DIAGNOSIS — Z87891 Personal history of nicotine dependence: Secondary | ICD-10-CM | POA: Insufficient documentation

## 2018-04-02 DIAGNOSIS — Z792 Long term (current) use of antibiotics: Secondary | ICD-10-CM | POA: Insufficient documentation

## 2018-04-02 MED ORDER — BACLOFEN 10 MG PO TABS: 10.0000 mg | ORAL_TABLET | Freq: Three times a day (TID) | ORAL | 0 refills | Status: DC

## 2018-04-02 MED ORDER — MELOXICAM 15 MG PO TABS: 15.0000 mg | ORAL_TABLET | Freq: Every day | ORAL | 0 refills | Status: AC

## 2018-04-02 MED ORDER — PREGABALIN 150 MG PO CAPS: 150.0000 mg | ORAL_CAPSULE | Freq: Two times a day (BID) | ORAL | 0 refills | Status: DC

## 2018-04-02 MED ORDER — OXYCODONE HCL 5 MG PO TABS: 2.5000 mg | ORAL_TABLET | Freq: Two times a day (BID) | ORAL | 0 refills | Status: DC

## 2018-04-02 NOTE — Patient Instructions (Addendum)
____________________________________________________________________________________________  Medication Rules  Purpose: To inform patients, and their family members, of our rules and regulations.  Applies to: All patients receiving prescriptions (written or electronic).  Pharmacy of record: Pharmacy where electronic prescriptions will be sent. If written prescriptions are taken to a different pharmacy, please inform the nursing staff. The pharmacy listed in the electronic medical record should be the one where you would like electronic prescriptions to be sent.  Electronic prescriptions: In compliance with the [place] Strengthen Opioid Misuse Prevention (STOP) Act of 2017 (Session Law 2017-74/H243), effective April 22, 2018, all controlled substances must be electronically prescribed. Calling prescriptions to the pharmacy will cease to exist.  Prescription refills: Only during scheduled appointments. Applies to all prescriptions.  NOTE: The following applies primarily to controlled substances (Opioid* Pain Medications).   Patient's responsibilities: 1. Pain Pills: Bring all pain pills to every appointment (except for procedure appointments). 2. Pill Bottles: Bring pills in original pharmacy bottle. Always bring the newest bottle. Bring bottle, even if empty. 3. Medication refills: You are responsible for knowing and keeping track of what medications you take and those you need refilled. The day before your appointment: write a list of all prescriptions that need to be refilled. The day of the appointment: give the list to the admitting nurse. Prescriptions will be written only during appointments. If you forget a medication: it will not be "Called in", "Faxed", or "electronically sent". You will need to get another appointment to get these prescribed. No early refills. Do not call asking to have your prescription filled early. 4. Prescription Accuracy: You are responsible for  carefully inspecting your prescriptions before leaving our office. Have the discharge nurse carefully go over each prescription with you, before taking them home. Make sure that your name is accurately spelled, that your address is correct. Check the name and dose of your medication to make sure it is accurate. Check the number of pills, and the written instructions to make sure they are clear and accurate. Make sure that you are given enough medication to last until your next medication refill appointment. 5. Taking Medication: Take medication as prescribed. When it comes to controlled substances, taking less pills or less frequently than prescribed is permitted and encouraged. Never take more pills than instructed. Never take medication more frequently than prescribed.  6. Inform other Doctors: Always inform, all of your healthcare providers, of all the medications you take. 7. Pain Medication from other Providers: You are not allowed to accept any additional pain medication from any other Doctor or Healthcare provider. There are two exceptions to this rule. (see below) In the event that you require additional pain medication, you are responsible for notifying us, as stated below. 8. Medication Agreement: You are responsible for carefully reading and following our Medication Agreement. This must be signed before receiving any prescriptions from our practice. Safely store a copy of your signed Agreement. Violations to the Agreement will result in no further prescriptions. (Additional copies of our Medication Agreement are available upon request.) 9. Laws, Rules, & Regulations: All patients are expected to follow all Federal and State Laws, Statutes, Rules, & Regulations. Ignorance of the Laws does not constitute a valid excuse. The use of any illegal substances is prohibited. 10. Adopted CDC guidelines & recommendations: Target dosing levels will be at or below 60 MME/day. Use of benzodiazepines** is not  recommended.  Exceptions: There are only two exceptions to the rule of not receiving pain medications from other Healthcare Providers. 1.   Exception #1 (Emergencies): In the event of an emergency (i.e.: accident requiring emergency care), you are allowed to receive additional pain medication. However, you are responsible for: As soon as you are able, call our office [phone], at any time of the day or night, and leave a message stating your name, the date and nature of the emergency, and the name and dose of the medication prescribed. In the event that your call is answered by a member of our staff, make sure to document and save the date, time, and the name of the person that took your information.  2. Exception #2 (Planned Surgery): In the event that you are scheduled by another doctor or dentist to have any type of surgery or procedure, you are allowed (for a period no longer than 30 days), to receive additional pain medication, for the acute post-op pain. However, in this case, you are responsible for picking up a copy of our "Post-op Pain Management for Surgeons" handout, and giving it to your surgeon or dentist. This document is available at our office, and does not require an appointment to obtain it. Simply go to our office during business hours (Monday-Thursday from 8:00 AM to 4:00 PM) (Friday 8:00 AM to 12:00 Noon) or if you have a scheduled appointment with [place], prior to your surgery, and ask for it by name. In addition, you will need to provide [place] with your name, name of your surgeon, type of surgery, and date of procedure or surgery.  *Opioid medications include: morphine, codeine, oxycodone, oxymorphone, hydrocodone, hydromorphone, meperidine, tramadol, tapentadol, buprenorphine, fentanyl, methadone. **Benzodiazepine medications include: diazepam (Valium), alprazolam (Xanax), clonazepam (Klonopine), lorazepam (Ativan), clorazepate (Tranxene), chlordiazepoxide (Librium), estazolam (Prosom),  oxazepam (Serax), temazepam (Restoril), triazolam (Halcion) (Last updated: 06/19/2017) ____________________________________________________________________________________________   ALL MEDICATIONS WERE E-SCRIBED TO YOUR [facility].

## 2018-04-02 NOTE — Progress Notes (Signed)
Nursing Pain Medication Assessment:  Safety precautions to be maintained throughout the outpatient stay will include: orient to surroundings, keep bed in low position, maintain call bell within reach at all times, provide assistance with transfer out of bed and ambulation.  Medication Inspection Compliance: Pill count conducted under aseptic conditions, in front of the patient. Neither the pills nor the bottle was removed from the patient's sight at any time. Once count was completed pills were immediately returned to the patient in their original bottle.  Medication: Oxycodone IR Pill/Patch Count: 4 of 30 pills remain Pill/Patch Appearance: Markings consistent with prescribed medication Bottle Appearance: Standard pharmacy container. Clearly labeled. Filled Date: 97 / 14 / 2019 Last Medication intake:  Today

## 2018-04-02 NOTE — Progress Notes (Signed)
Patient's Name: James House.  MRN: [id number]  Referring Provider: Pleas Koch, NP  DOB: August 13, 1970  PCP: Pleas Koch, NP  DOS: 04/02/2018  Note by: Vevelyn Francois NP  Service setting: Ambulatory outpatient  Specialty: Interventional Pain Management  Location: [facility] (AMB) Pain Management Facility    Patient type: Established    Primary Reason(s) for Visit: Encounter for prescription drug management & post-procedure evaluation of chronic illness with mild to moderate exacerbation(Level of risk: moderate) CC: Back Pain (lower) and Leg Pain (calves posteriorly bilaterally)  HPI  James House is a 47 y.o. year old, male patient, who comes today for a post-procedure evaluation and medication management. He has Severe sepsis ([place]); Hyponatremia; Multiple myeloma ([place]); Syncope; CKD (chronic kidney disease), stage III ([place]); HTN (hypertension); Vitamin B 12 deficiency; Chronic low back pain (Primary Area of Pain) (Bilateral) (L>R) w/ sciatica (Bilateral); Chronic lower extremity pain (Secondary Area of Pain) (Bilateral) (L>R); Chronic knee pain (Tertiary Area of Pain) (Bilateral) (L>R); Chronic pain syndrome; Long term current use of opiate analgesic; Pharmacologic therapy; Disorder of skeletal system; Problems influencing health status; Chemotherapy-induced peripheral neuropathy ([place]); Neurogenic pain; Neuropathic pain; History of Partial knee replacement (Left); Chronic knee pain s/p partial replacement (Left); Elevated sedimentation rate; Hypocalcemia; Osteoarthritis of knee (Right); Chronic hip pain (Bilateral) (R>L); Chronic sacroiliac joint pain (Bilateral) (R>L); Other specified dorsopathies, sacral and sacrococcygeal region; Lumbar facet syndrome (Bilateral) (L>R); Spondylosis without myelopathy or radiculopathy, lumbar region; Chronic lumbar radiculopathy (by EMG/PNCV) (L5) (Right); Chronic low back pain (Primary Area of Pain) (Bilateral) (L>R) w/o sciatica; Spasm of back muscles; and  Chronic musculoskeletal pain on their problem list. His primarily concern today is the Back Pain (lower) and Leg Pain (calves posteriorly bilaterally)  Pain Assessment: Location: Lower Back Radiating: calves Onset: More than a month ago Duration: Chronic pain Quality: Constant(agonizing) Severity: 7 /10 (subjective, self-reported pain score)  Note: Reported level is compatible with observation. Clinically the patient looks like a 2/10 A 2/10 is viewed as "Mild to Moderate" and described as noticeable and distracting. Impossible to hide from other people. More frequent flare-ups. Still possible to adapt and function close to normal. It can be very annoying and may have occasional stronger flare-ups. With discipline, patients may get used to it and adapt.       When using our objective Pain Scale, levels between 6 and 10/10 are said to belong in an emergency room, as it progressively worsens from a 6/10, described as severely limiting, requiring emergency care not usually available at an outpatient pain management facility. At a 6/10 level, communication becomes difficult and requires great effort. Assistance to reach the emergency department may be required. Facial flushing and profuse sweating along with potentially dangerous increases in heart rate and blood pressure will be evident. Effect on ADL:   Timing: Constant Modifying factors: medications, procedures ( short term) BP: (!) 155/96  HR: 90  [name] was last seen on 03/25/2018 for a procedure. During today's appointment we reviewed [name]'s post-procedure results, as well as his outpatient medication regimen. He feels like his pain is worse. He has a burning pain that goes across his back. He admits that he has more pain with sitting for long periods. He feels like since the procedure he has had more problems going up the steps; 4 steps. He denies any leg pain. He did not start PT as recommended.   Further details on both, my  assessment(s), as well as the proposed treatment plan,  please see below.  Controlled Substance Pharmacotherapy Assessment REMS (Risk Evaluation and Mitigation Strategy)  Analgesic:Oxycodone IR 5 mg, 1/2 tab PO BID (1tab/day) MME/day:7.76m/day.  SHart Rochester RN  04/02/2018 11:27 AM  Sign when Signing Visit Nursing Pain Medication Assessment:  Safety precautions to be maintained throughout the outpatient stay will include: orient to surroundings, keep bed in low position, maintain call bell within reach at all times, provide assistance with transfer out of bed and ambulation.  Medication Inspection Compliance: Pill count conducted under aseptic conditions, in front of the patient. Neither the pills nor the bottle was removed from the patient's sight at any time. Once count was completed pills were immediately returned to the patient in their original bottle.  Medication: Oxycodone IR Pill/Patch Count: 4 of 30 pills remain Pill/Patch Appearance: Markings consistent with prescribed medication Bottle Appearance: Standard pharmacy container. Clearly labeled. Filled Date: 166/ 14 / 2019 Last Medication intake:  Today   Pharmacokinetics: Liberation and absorption (onset of action): WNL Distribution (time to peak effect): WNL Metabolism and excretion (duration of action): WNL         Pharmacodynamics: Desired effects: Analgesia: Mr. HPinedoreports >50% benefit. Functional ability: Patient reports that medication allows him to accomplish basic ADLs Clinically meaningful improvement in function (CMIF): Sustained CMIF goals met Perceived effectiveness: Described as relatively effective, allowing for increase in activities of daily living (ADL) Undesirable effects: Side-effects or Adverse reactions: None reported Monitoring: [place] PMP: Online review of the past 47-montheriod conducted. Compliant with practice rules and regulations Last UDS on record: Summary  Date Value Ref Range Status   11/26/2017 FINAL  Final    Comment:    ==================================================================== [facility] COMP DRUG ANALYSIS,UR ==================================================================== Test                             Result       Flag       Units Drug Present and Declared for Prescription Verification   Oxycodone                      386          EXPECTED   ng/mg creat   Oxymorphone                    555          EXPECTED   ng/mg creat   Noroxycodone                   1797         EXPECTED   ng/mg creat   Noroxymorphone                 323          EXPECTED   ng/mg creat    Sources of oxycodone are scheduled prescription medications.    Oxymorphone, noroxycodone, and noroxymorphone are expected    metabolites of oxycodone. Oxymorphone is also available as a    scheduled prescription medication.   Gabapentin                     PRESENT      EXPECTED   Zolpidem                       PRESENT      EXPECTED   Zolpidem Acid  PRESENT      EXPECTED    Zolpidem acid is an expected metabolite of zolpidem. Drug Present not Declared for Prescription Verification   Tramadol                       >6849        UNEXPECTED ng/mg creat   O-Desmethyltramadol            >6849        UNEXPECTED ng/mg creat   N-Desmethyltramadol            6836         UNEXPECTED ng/mg creat    Source of tramadol is a prescription medication.    O-desmethyltramadol and N-desmethyltramadol are expected    metabolites of tramadol. Drug Absent but Declared for Prescription Verification   Pregabalin                     Not Detected UNEXPECTED   Tizanidine                     Not Detected UNEXPECTED    Tizanidine, as indicated in the declared medication list, is not    always detected even when used as directed.   Acetaminophen                  Not Detected UNEXPECTED    Acetaminophen, as indicated in the declared medication list, is    not always detected even when used as  directed.   Salicylate                     Not Detected UNEXPECTED    Aspirin, as indicated in the declared medication list, is not    always detected even when used as directed. ==================================================================== Test                      Result    Flag   Units      Ref Range   Creatinine              73               mg/dL      >=20 ==================================================================== Declared Medications:  The flagging and interpretation on this report are based on the  following declared medications.  Unexpected results may arise from  inaccuracies in the declared medications.  **Note: The testing scope of this panel includes these medications:  Gabapentin (Neurontin)  Oxycodone  Pregabalin (Lyrica)  **Note: The testing scope of this panel does not include small to  moderate amounts of these reported medications:  Acetaminophen (Tylenol)  Aspirin (Aspirin 81)  Tizanidine (Zanaflex)  Zolpidem (Ambien)  **Note: The testing scope of this panel does not include following  reported medications:  Acyclovir (Zovirax)  Lenalidomide (Revlimid)  Pantoprazole (Protonix)  Sildenafil (Revatio)  Vitamin D2 (Drisdol) ==================================================================== For clinical consultation, please call [phone]. ====================================================================    UDS interpretation: Unexpected findings:          Medication Assessment Form: Reviewed. Patient indicates being compliant with therapy Treatment compliance: Compliant Risk Assessment Profile: Aberrant behavior: See prior evaluations. None observed or detected today Comorbid factors increasing risk of overdose: See prior notes. No additional risks detected today Opioid risk tool (ORT) (Total Score): 0 Personal History of Substance Abuse (SUD-Substance use disorder):  Alcohol: Negative  Illegal Drugs: Negative  Rx Drugs: Negative   ORT Risk Level  calculation: Low Risk Risk of substance use disorder (SUD): Low Opioid Risk Tool - 04/02/18 1124      Family History of Substance Abuse   Alcohol  Negative    Illegal Drugs  Negative    Rx Drugs  Negative      Personal History of Substance Abuse   Alcohol  Negative    Illegal Drugs  Negative    Rx Drugs  Negative      Age   Age between 48-45 years   No      History of Preadolescent Sexual Abuse   History of Preadolescent Sexual Abuse  Negative or Male      Psychological Disease   Psychological Disease  Negative    Depression  Negative      Total Score   Opioid Risk Tool Scoring  0    Opioid Risk Interpretation  Low Risk      ORT Scoring interpretation table:  Score <3 = Low Risk for SUD  Score between 4-7 = Moderate Risk for SUD  Score >8 = High Risk for Opioid Abuse   Risk Mitigation Strategies:  Patient Counseling: Covered Patient-Prescriber Agreement (PPA): Present and active  Notification to other healthcare providers: Done  Pharmacologic Plan: No change in therapy, at this time.             Post-Procedure Assessment  03/24/2018 Procedure:Bilateral Lumbar facet nerve block Pre-procedure pain score:  3/10 Post-procedure pain score: 0/10         Influential Factors: BMI: 23.34 kg/m Intra-procedural challenges: None observed.         Assessment challenges: None detected.              Reported side-effects: None.        Post-procedural adverse reactions or complications: None reported         Sedation: Please see nurses note. When no sedatives are used, the analgesic levels obtained are directly associated to the effectiveness of the local anesthetics. However, when sedation is provided, the level of analgesia obtained during the initial 1 hour following the intervention, is believed to be the result of a combination of factors. These factors may include, but are not limited to: 1. The effectiveness of the local anesthetics used. 2. The effects  of the analgesic(s) and/or anxiolytic(s) used. 3. The degree of discomfort experienced by the patient at the time of the procedure. 4. The patients ability and reliability in recalling and recording the events. 5. The presence and influence of possible secondary gains and/or psychosocial factors. Reported result: Relief experienced during the 1st hour after the procedure: 60 % (Ultra-Short Term Relief)            Interpretative annotation: Clinically appropriate result. Analgesia during this period is likely to be Local Anesthetic and/or IV Sedative (Analgesic/Anxiolytic) related.          Effects of local anesthetic: The analgesic effects attained during this period are directly associated to the localized infiltration of local anesthetics and therefore cary significant diagnostic value as to the etiological location, or anatomical origin, of the pain. Expected duration of relief is directly dependent on the pharmacodynamics of the local anesthetic used. Long-acting (4-6 hours) anesthetics used.  Reported result: Relief during the next 4 to 6 hour after the procedure: 60 % (Short-Term Relief)            Interpretative annotation: Clinically appropriate result. Analgesia during this period is likely to be Local Anesthetic-related.  Long-term benefit: Defined as the period of time past the expected duration of local anesthetics (1 hour for short-acting and 4-6 hours for long-acting). With the possible exception of prolonged sympathetic blockade from the local anesthetics, benefits during this period are typically attributed to, or associated with, other factors such as analgesic sensory neuropraxia, antiinflammatory effects, or beneficial biochemical changes provided by agents other than the local anesthetics.  Reported result: Extended relief following procedure: 60 %(1 day) (Long-Term Relief)            Interpretative annotation: Clinically possible results. Good relief. No permanent benefit  expected. Inflammation plays a part in the etiology to the pain.          Current benefits: Defined as reported results that persistent at this point in time.   Analgesia: <50 %            Function: No improvement ROM: No improvement Interpretative annotation: Recurrence of symptoms. No permanent benefit expected. Effective diagnostic intervention.          Interpretation: Results would suggest a successful diagnostic intervention.                  Plan:  Please see "Plan of Care" for details.                Laboratory Chemistry  Inflammation Markers (CRP: Acute Phase) (ESR: Chronic Phase) Lab Results  Component Value Date   CRP 9 11/26/2017   ESRSEDRATE 16 (H) 11/26/2017   LATICACIDVEN 1.1 08/11/2017                         Rheumatology Markers No results found for: RF, ANA, LABURIC, URICUR, LYMEIGGIGMAB, LYMEABIGMQN, HLAB27                      Renal Function Markers Lab Results  Component Value Date   BUN 15 03/13/2018   CREATININE 1.54 (H) 03/13/2018   BCR 7 (L) 11/26/2017   GFRAA >60 03/13/2018   GFRNONAA 52 (L) 03/13/2018                             Hepatic Function Markers Lab Results  Component Value Date   AST 25 03/13/2018   ALT 25 03/13/2018   ALBUMIN 3.6 03/13/2018   ALKPHOS 69 03/13/2018   LIPASE 52 (H) 12/22/2017                        Electrolytes Lab Results  Component Value Date   NA 137 03/13/2018   K 3.8 03/13/2018   CL 104 03/13/2018   CALCIUM 8.6 (L) 03/13/2018   MG 2.4 (H) 11/26/2017                        Neuropathy Markers Lab Results  Component Value Date   VITAMINB12 319 11/26/2017   HIV Non Reactive 08/12/2017                        CNS Tests No results found for: COLORCSF, APPEARCSF, RBCCOUNTCSF, WBCCSF, POLYSCSF, LYMPHSCSF, EOSCSF, PROTEINCSF, GLUCCSF, JCVIRUS, CSFOLI, IGGCSF                      Bone Pathology Markers Lab Results  Component Value Date   25OHVITD1 41 11/26/2017   25OHVITD2 4.4 11/26/2017  25OHVITD3 37  11/26/2017   TESTOSTERONE 570 12/29/2017                         Coagulation Parameters Lab Results  Component Value Date   PLT 144 (L) 03/23/2018                        Cardiovascular Markers Lab Results  Component Value Date   TROPONINI <0.03 08/23/2017   HGB 13.2 03/23/2018   HCT 39.1 03/23/2018                         CA Markers No results found for: CEA, CA125, LABCA2                      Note: Lab results reviewed.  Recent Diagnostic Imaging Results  DG C-Arm 1-60 Min-No Report Fluoroscopy was utilized by the requesting physician.  No radiographic  interpretation.   Complexity Note: Imaging results reviewed. Results shared with Mr. Kazmierski, using Layman's terms.                         Meds   Current Outpatient Medications:  .  acetaminophen (TYLENOL) 325 MG tablet, Take 2 tablets (650 mg total) by mouth every 6 (six) hours as needed for mild pain (or Fever >/= 101)., Disp: , Rfl:  .  acyclovir (ZOVIRAX) 200 MG capsule, Take 200 mg by mouth 2 (two) times daily. , Disp: , Rfl:  .  amoxicillin-clavulanate (AUGMENTIN) 875-125 MG tablet, Take 1 tablet by mouth 2 (two) times daily., Disp: 14 tablet, Rfl: 0 .  aspirin EC 81 MG tablet, Take 81 mg by mouth daily., Disp: , Rfl:  .  [START ON 04/04/2018] baclofen (LIORESAL) 10 MG tablet, Take 1 tablet (10 mg total) by mouth 3 (three) times daily., Disp: 90 tablet, Rfl: 0 .  gabapentin (NEURONTIN) 600 MG tablet, Take 1 tablet (600 mg total) by mouth 3 (three) times daily., Disp: 90 tablet, Rfl: 0 .  lenalidomide (REVLIMID) 5 MG capsule, TAKE 1 CAPSULE BY MOUTH ONE TIME DAILY, Disp: 28 capsule, Rfl: 3 .  [START ON 04/04/2018] oxyCODONE (OXY IR/ROXICODONE) 5 MG immediate release tablet, Take 0.5 tablets (2.5 mg total) by mouth 2 (two) times daily., Disp: 30 tablet, Rfl: 0 .  pantoprazole (PROTONIX) 40 MG tablet, Take 40 mg by mouth daily., Disp: , Rfl:  .  predniSONE (DELTASONE) 10 MG tablet, Take 6 tabs day 1, 5 tabs day 2, 4 tabs  day 3, 3 tabs day 4, 2 tabs day 5, 1 tab day 6, Disp: 21 tablet, Rfl: 0 .  [START ON 04/04/2018] pregabalin (LYRICA) 150 MG capsule, Take 1 capsule (150 mg total) by mouth 2 (two) times daily., Disp: 60 capsule, Rfl: 0 .  promethazine (PHENERGAN) 25 MG tablet, , Disp: , Rfl: 0 .  Vitamin D, Ergocalciferol, (DRISDOL) 50000 units CAPS capsule, Take 50,000 Units by mouth every 7 (seven) days. Takes on Monday, Disp: , Rfl:  .  meloxicam (MOBIC) 15 MG tablet, Take 1 tablet (15 mg total) by mouth daily., Disp: 30 tablet, Rfl: 0  ROS  Constitutional: Denies any fever or chills Gastrointestinal: No reported hemesis, hematochezia, vomiting, or acute GI distress Musculoskeletal: Denies any acute onset joint swelling, redness, loss of ROM, or weakness Neurological: No reported episodes of acute onset apraxia, aphasia, dysarthria, agnosia, amnesia, paralysis, loss of  coordination, or loss of consciousness  Allergies  Mr. Maqueda has No Known Allergies.  PFSH  Drug: Mr. Morella  reports no history of drug use. Alcohol:  reports no history of alcohol use. Tobacco:  reports that he quit smoking about 4 years ago. His smokeless tobacco use includes snuff. Medical:  has a past medical history of CKD (chronic kidney disease) stage 3, GFR 30-59 ml/min ([place]), Hypertension, Multiple myeloma ([place]) (08/11/2017), Neuropathy, and Pneumonia. Surgical: Mr. Schoenherr  has a past surgical history that includes Joint replacement; Knee surgery (Left); and Abdominal surgery. Family: family history includes COPD in his maternal grandmother and mother; Cancer in his maternal uncle; Cancer (age of onset: 25) in his maternal grandmother; Cancer (age of onset: 72) in his mother; Diabetes in his maternal grandmother and mother; Hyperlipidemia in his mother; Hypertension in his maternal grandmother and mother.  Constitutional Exam  General appearance: Well nourished, well developed, and well hydrated. In no apparent acute distress Vitals:    04/02/18 1121  BP: (!) 155/96  Pulse: 90  Resp: 18  Temp: 98.8 F (37.1 C)  TempSrc: Oral  SpO2: 100%  Weight: 149 lb (67.6 kg)  Height: '5\' 7"'  (1.702 m)  Psych/Mental status: Alert, oriented x 3 (person, place, & time)       Eyes: PERLA Respiratory: No evidence of acute respiratory distress  Cervical Spine Area Exam  Skin & Axial Inspection: No masses, redness, edema, swelling, or associated skin lesions Alignment: Symmetrical Functional ROM: Unrestricted ROM      Stability: No instability detected Muscle Tone/Strength: Functionally intact. No obvious neuro-muscular anomalies detected. Sensory (Neurological): Unimpaired Palpation: No palpable anomalies              Upper Extremity (UE) Exam    Side: Right upper extremity  Side: Left upper extremity  Skin & Extremity Inspection: Skin color, temperature, and hair growth are WNL. No peripheral edema or cyanosis. No masses, redness, swelling, asymmetry, or associated skin lesions. No contractures.  Skin & Extremity Inspection: Skin color, temperature, and hair growth are WNL. No peripheral edema or cyanosis. No masses, redness, swelling, asymmetry, or associated skin lesions. No contractures.  Functional ROM: Unrestricted ROM          Functional ROM: Unrestricted ROM          Muscle Tone/Strength: Functionally intact. No obvious neuro-muscular anomalies detected.  Muscle Tone/Strength: Functionally intact. No obvious neuro-muscular anomalies detected.  Sensory (Neurological): Unimpaired          Sensory (Neurological): Unimpaired          Palpation: No palpable anomalies              Palpation: No palpable anomalies              Provocative Test(s):  Phalen's test: deferred Tinel's test: deferred Apley's scratch test (touch opposite shoulder):  Action 1 (Across chest): deferred Action 2 (Overhead): deferred Action 3 (LB reach): deferred   Provocative Test(s):  Phalen's test: deferred Tinel's test: deferred Apley's scratch test  (touch opposite shoulder):  Action 1 (Across chest): deferred Action 2 (Overhead): deferred Action 3 (LB reach): deferred    Thoracic Spine Area Exam  Skin & Axial Inspection: No masses, redness, or swelling Alignment: Symmetrical Functional ROM: Unrestricted ROM Stability: No instability detected Muscle Tone/Strength: Functionally intact. No obvious neuro-muscular anomalies detected. Sensory (Neurological): Unimpaired Muscle strength & Tone: No palpable anomalies  Lumbar Spine Area Exam  Skin & Axial Inspection: No masses, redness, or swelling Alignment: Symmetrical  Functional ROM: Unrestricted ROM       Stability: No instability detected Muscle Tone/Strength: Functionally intact. No obvious neuro-muscular anomalies detected. Sensory (Neurological): Unimpaired Palpation: No palpable anomalies       Provocative Tests: Hyperextension/rotation test: Positive bilaterally for facet joint pain.   Gait & Posture Assessment  Ambulation: Unassisted Gait: Relatively normal for age and body habitus Posture: WNL   Lower Extremity Exam    Side: Right lower extremity  Side: Left lower extremity  Stability: No instability observed          Stability: No instability observed          Skin & Extremity Inspection: Skin color, temperature, and hair growth are WNL. No peripheral edema or cyanosis. No masses, redness, swelling, asymmetry, or associated skin lesions. No contractures.  Skin & Extremity Inspection: Skin color, temperature, and hair growth are WNL. No peripheral edema or cyanosis. No masses, redness, swelling, asymmetry, or associated skin lesions. No contractures.  Functional ROM: Unrestricted ROM                  Functional ROM: Unrestricted ROM                  Muscle Tone/Strength: Functionally intact. No obvious neuro-muscular anomalies detected.  Muscle Tone/Strength: Functionally intact. No obvious neuro-muscular anomalies detected.  Sensory (Neurological): Unimpaired         Sensory (Neurological): Unimpaired        Palpation: No palpable anomalies  Palpation: No palpable anomalies   Assessment  Primary Diagnosis & Pertinent Problem List: The primary encounter diagnosis was Spondylosis without myelopathy or radiculopathy, lumbar region. Diagnoses of Neurogenic pain, Spasm of back muscles, Chronic musculoskeletal pain, Chronic pain syndrome, Neuropathic pain, and Long term prescription opiate use were also pertinent to this visit.  Status Diagnosis  Not responding Persistent Controlled 1. Spondylosis without myelopathy or radiculopathy, lumbar region   2. Neurogenic pain   3. Spasm of back muscles   4. Chronic musculoskeletal pain   5. Chronic pain syndrome   6. Neuropathic pain   7. Long term prescription opiate use     Problems updated and reviewed during this visit: No problems updated. Plan of Care  Pharmacotherapy (Medications Ordered): Meds ordered this encounter  Medications  . baclofen (LIORESAL) 10 MG tablet    Sig: Take 1 tablet (10 mg total) by mouth 3 (three) times daily.    Dispense:  90 tablet    Refill:  0    Do not place this medication, or any other prescription from our practice, on "Automatic Refill". Patient may have prescription filled one day early if pharmacy is closed on scheduled refill date.    Order Specific Question:   Supervising Provider    Answer:   Milinda Pointer [phone]  . pregabalin (LYRICA) 150 MG capsule    Sig: Take 1 capsule (150 mg total) by mouth 2 (two) times daily.    Dispense:  60 capsule    Refill:  0    Do not place medication on "Automatic Refill". Fill one day early if pharmacy is closed on scheduled refill date.    Order Specific Question:   Supervising Provider    Answer:   Milinda Pointer [phone]  . oxyCODONE (OXY IR/ROXICODONE) 5 MG immediate release tablet    Sig: Take 0.5 tablets (2.5 mg total) by mouth 2 (two) times daily.    Dispense:  30 tablet    Refill:  0    Do not place  this  medication, or any other prescription from our practice, on "Automatic Refill". Patient may have prescription filled one day early if pharmacy is closed on scheduled refill date.    Order Specific Question:   Supervising Provider    Answer:   Milinda Pointer [phone]  . meloxicam (MOBIC) 15 MG tablet    Sig: Take 1 tablet (15 mg total) by mouth daily.    Dispense:  30 tablet    Refill:  0    Do not add this medication to the electronic "Automatic Refill" notification system. Patient may have prescription filled one day early if pharmacy is closed on scheduled refill date.    Order Specific Question:   Supervising Provider    Answer:   Milinda Pointer [phone]   New Prescriptions   MELOXICAM (MOBIC) 15 MG TABLET    Take 1 tablet (15 mg total) by mouth daily.   Medications administered today: [name] had no medications administered during this visit. Lab-work, procedure(s), and/or referral(s): Orders Placed This Encounter  Procedures  . ToxASSURE Select 13 (MW), Urine   Imaging and/or referral(s): None  Interventional management options: Planned, scheduled, and/or pending: Not at this time.    Considering: DiagnosticbilateralLESI Diagnosticbilateral lumbar facet block Possible bilateral lumbar facet RFA Diagnostic bilateral sacroiliac joint block Possible bilateral sacroiliac joint RFA Diagnostic right knee Hyalgan series Diagnosticright knee intra-articular knee injection Diagnostic left knee genicular nerve block   PRN Procedures: None at this time    Provider-requested follow-up: Return in about 4 weeks (around 04/30/2018) for MedMgmt.  Future Appointments  Date Time Provider [place]  04/08/2018  8:15 AM Milinda Pointer, MD [facility] None  04/10/2018  9:30 AM [facility] None  04/10/2018 10:00 AM [facility] INJECTION [facility] None  04/29/2018  9:15 AM Vevelyn Francois, NP [facility] None  05/25/2018 10:00 AM  Bernardo Heater, Ronda Fairly, MD [facility] None  06/12/2018  9:30 AM [facility] None  06/12/2018 10:00 AM Sindy Guadeloupe, MD [facility] None  06/12/2018 10:30 AM [facility] INJE[facility] None   Primary Care Physician: Pleas Koch, NP Location: [facility] Note by: Vevelyn Francois NP Date: 04/02/2018; Time: 11:47 PM  Pain Score Disclaimer: We use the NRS-11 scale. This is a self-reported, subjective measurement of pain severity with only modest accuracy. It is used primarily to identify changes within a particular patient. It must be understood that outpatient pain scales are significantly less accurate that those used for research, where they can be applied under ideal controlled circumstances with minimal exposure to variables. In reality, the score is likely to be a combination of pain intensity and pain affect, where pain affect describes the degree of emotional arousal or changes in action readiness caused by the sensory experience of pain. Factors such as social and work situation, setting, emotional state, anxiety levels, expectation, and prior pain experience may influence pain perception and show large inter-individual differences that may also be affected by time variables.  Patient instructions provided during this appointment: Patient Instructions  ____________________________________________________________________________________________  Medication Rules  Purpose: To inform patients, and their family members, of our rules and regulations.  Applies to: All patients receiving prescriptions (written or electronic).  Pharmacy of record: Pharmacy where electronic prescriptions will be sent. If written prescriptions are taken to a different pharmacy, please inform the nursing staff. The pharmacy listed in the electronic medical record should be the one where you would like electronic prescriptions to be sent.  Electronic prescriptions:  In  compliance with the [place] (STOP) Act of 2017 (Session Lanny Cramp [phone]), effective April 22, 2018, all controlled substances must be electronically prescribed. Calling prescriptions to the pharmacy will cease to exist.  Prescription refills: Only during scheduled appointments. Applies to all prescriptions.  NOTE: The following applies primarily to controlled substances (Opioid* Pain Medications).   Patient's responsibilities: 1. Pain Pills: Bring all pain pills to every appointment (except for procedure appointments). 2. Pill Bottles: Bring pills in original pharmacy bottle. Always bring the newest bottle. Bring bottle, even if empty. 3. Medication refills: You are responsible for knowing and keeping track of what medications you take and those you need refilled. The day before your appointment: write a list of all prescriptions that need to be refilled. The day of the appointment: give the list to the admitting nurse. Prescriptions will be written only during appointments. If you forget a medication: it will not be "Called in", "Faxed", or "electronically sent". You will need to get another appointment to get these prescribed. No early refills. Do not call asking to have your prescription filled early. 4. Prescription Accuracy: You are responsible for carefully inspecting your prescriptions before leaving our office. Have the discharge nurse carefully go over each prescription with you, before taking them home. Make sure that your name is accurately spelled, that your address is correct. Check the name and dose of your medication to make sure it is accurate. Check the number of pills, and the written instructions to make sure they are clear and accurate. Make sure that you are given enough medication to last until your next medication refill appointment. 5. Taking Medication: Take medication as prescribed. When it comes to controlled substances, taking  less pills or less frequently than prescribed is permitted and encouraged. Never take more pills than instructed. Never take medication more frequently than prescribed.  6. Inform other Doctors: Always inform, all of your healthcare providers, of all the medications you take. 7. Pain Medication from other Providers: You are not allowed to accept any additional pain medication from any other Doctor or Healthcare provider. There are two exceptions to this rule. (see below) In the event that you require additional pain medication, you are responsible for notifying us, as stated below. 8. Medication Agreement: You are responsible for carefully reading and following our Medication Agreement. This must be signed before receiving any prescriptions from our practice. Safely store a copy of your signed Agreement. Violations to the Agreement will result in no further prescriptions. (Additional copies of our Medication Agreement are available upon request.) 9. Laws, Rules, & Regulations: All patients are expected to follow all Federal and [employer], Rules, [employer]. Ignorance of the Laws does not constitute a valid excuse. The use of any illegal substances is prohibited. 10. Adopted CDC guidelines & recommendations: Target dosing levels will be at or below 60 MME/day. Use of benzodiazepines** is not recommended.  Exceptions: There are only two exceptions to the rule of not receiving pain medications from other Healthcare Providers. 1. Exception #1 (Emergencies): In the event of an emergency (i.e.: accident requiring emergency care), you are allowed to receive additional pain medication. However, you are responsible for: As soon as you are able, call our office [phone], at any time of the day or night, and leave a message stating your name, the date and nature of the emergency, and the name and dose of the medication prescribed. In the event that your call is answered  by a member of our staff,  make sure to document and save the date, time, and the name of the person that took your information.  2. Exception #2 (Planned Surgery): In the event that you are scheduled by another doctor or dentist to have any type of surgery or procedure, you are allowed (for a period no longer than 30 days), to receive additional pain medication, for the acute post-op pain. However, in this case, you are responsible for picking up a copy of our "Post-op Pain Management for Surgeons" handout, and giving it to your surgeon or dentist. This document is available at our office, and does not require an appointment to obtain it. Simply go to our office during business hours (Monday-Thursday from 8:00 AM to 4:00 PM) (Friday 8:00 AM to 12:00 Noon) or if you have a scheduled appointment with [place], prior to your surgery, and ask for it by name. In addition, you will need to provide [place] with your name, name of your surgeon, type of surgery, and date of procedure or surgery.  *Opioid medications include: morphine, codeine, oxycodone, oxymorphone, hydrocodone, hydromorphone, meperidine, tramadol, tapentadol, buprenorphine, fentanyl, methadone. **Benzodiazepine medications include: diazepam (Valium), alprazolam (Xanax), clonazepam (Klonopine), lorazepam (Ativan), clorazepate (Tranxene), chlordiazepoxide (Librium), estazolam (Prosom), oxazepam (Serax), temazepam (Restoril), triazolam (Halcion) (Last updated: 06/19/2017) ____________________________________________________________________________________________   ALL MEDICATIONS WERE E-SCRIBED TO YOUR PHARMACY OF [place].

## 2018-04-06 ENCOUNTER — Ambulatory Visit: Payer: No Typology Code available for payment source | Admitting: Pain Medicine

## 2018-04-07 ENCOUNTER — Encounter: Payer: Self-pay | Admitting: *Deleted

## 2018-04-07 NOTE — Progress Notes (Deleted)
Patient's Name: James House.  MRN: [id number]  Referring Provider: Pleas Koch, NP  DOB: [birth year]  PCP: Pleas Koch, NP  DOS: 04/08/2018  Note by: Gaspar Cola, MD  Service setting: Ambulatory outpatient  Specialty: Interventional Pain Management  Location: [facility] (AMB) Pain Management Facility    Patient type: Established   Primary Reason(s) for Visit: Encounter for post-procedure evaluation of chronic illness with mild to moderate exacerbation CC: No chief complaint on file.  HPI  James House is a 47 y.o. year old, male patient, who comes today for a post-procedure evaluation. He has Severe sepsis ([place]); Hyponatremia; Multiple myeloma ([place]); Syncope; CKD (chronic kidney disease), stage III ([place]); HTN (hypertension); Vitamin B 12 deficiency; Chronic low back pain (Primary Area of Pain) (Bilateral) (L>R) w/ sciatica (Bilateral); Chronic lower extremity pain (Secondary Area of Pain) (Bilateral) (L>R); Chronic knee pain (Tertiary Area of Pain) (Bilateral) (L>R); Chronic pain syndrome; Long term current use of opiate analgesic; Pharmacologic therapy; Disorder of skeletal system; Problems influencing health status; Chemotherapy-induced peripheral neuropathy ([place]); Neurogenic pain; Neuropathic pain; History of Partial knee replacement (Left); Chronic knee pain s/p partial replacement (Left); Elevated sedimentation rate; Hypocalcemia; Osteoarthritis of knee (Right); Chronic hip pain (Bilateral) (R>L); Chronic sacroiliac joint pain (Bilateral) (R>L); Other specified dorsopathies, sacral and sacrococcygeal region; Lumbar facet syndrome (Bilateral) (L>R); Spondylosis without myelopathy or radiculopathy, lumbar region; Chronic lumbar radiculopathy (by EMG/PNCV) (L5) (Right); Chronic low back pain (Primary Area of Pain) (Bilateral) (L>R) w/o sciatica; Spasm of back muscles; and Chronic musculoskeletal pain on their problem list. His primarily concern today is the No chief complaint on  file.  Pain Assessment: Location:     Radiating:   Onset:   Duration:   Quality:   Severity:  /10 (subjective, self-reported pain score)  Note: Reported level is compatible with observation.                         When using our objective Pain Scale, levels between 6 and 10/10 are said to belong in an emergency room, as it progressively worsens from a 6/10, described as severely limiting, requiring emergency care not usually available at an outpatient pain management facility. At a 6/10 level, communication becomes difficult and requires great effort. Assistance to reach the emergency department may be required. Facial flushing and profuse sweating along with potentially dangerous increases in heart rate and blood pressure will be evident. Effect on ADL:   Timing:   Modifying factors:   BP:    HR:    James House comes in today for post-procedure evaluation.  Further details on both, my assessment(s), as well as the proposed treatment plan, please see below.  Post-Procedure Assessment  03/24/2018 Procedure: Diagnostic bilateral lumbar facet block #1 under fluoroscopic guidance and IV sedation Pre-procedure pain score:  7/10 Post-procedure pain score: 0/10 (100% relief) Influential Factors: BMI:   Intra-procedural challenges: None observed.         Assessment challenges: None detected.              Reported side-effects: None.        Post-procedural adverse reactions or complications: None reported         Sedation: Sedation provided. When no sedatives are used, the analgesic levels obtained are directly associated to the effectiveness of the local anesthetics. However, when sedation is provided, the level of analgesia obtained during the initial 1 hour following the intervention, is believed to be the result of a  combination of factors. These factors may include, but are not limited to: 1. The effectiveness of the local anesthetics used. 2. The effects of the analgesic(s) and/or  anxiolytic(s) used. 3. The degree of discomfort experienced by the patient at the time of the procedure. 4. The patients ability and reliability in recalling and recording the events. 5. The presence and influence of possible secondary gains and/or psychosocial factors. Reported result: Relief experienced during the 1st hour after the procedure:   (Ultra-Short Term Relief)            Interpretative annotation: Clinically appropriate result. Analgesia during this period is likely to be Local Anesthetic and/or IV Sedative (Analgesic/Anxiolytic) related.          Effects of local anesthetic: The analgesic effects attained during this period are directly associated to the localized infiltration of local anesthetics and therefore cary significant diagnostic value as to the etiological location, or anatomical origin, of the pain. Expected duration of relief is directly dependent on the pharmacodynamics of the local anesthetic used. Long-acting (4-6 hours) anesthetics used.  Reported result: Relief during the next 4 to 6 hour after the procedure:   (Short-Term Relief)            Interpretative annotation: Clinically appropriate result. Analgesia during this period is likely to be Local Anesthetic-related.          Long-term benefit: Defined as the period of time past the expected duration of local anesthetics (1 hour for short-acting and 4-6 hours for long-acting). With the possible exception of prolonged sympathetic blockade from the local anesthetics, benefits during this period are typically attributed to, or associated with, other factors such as analgesic sensory neuropraxia, antiinflammatory effects, or beneficial biochemical changes provided by agents other than the local anesthetics.  Reported result: Extended relief following procedure:   (Long-Term Relief)            Interpretative annotation: Clinically possible results. Good relief. No permanent benefit expected. Inflammation plays a part in the  etiology to the pain.          Current benefits: Defined as reported results that persistent at this point in time.   Analgesia: *** %            Function: Somewhat improved ROM: Somewhat improved Interpretative annotation: Recurrence of symptoms. No permanent benefit expected. Effective diagnostic intervention.          Interpretation: Results would suggest a successful diagnostic intervention.                  Plan:  Please see "Plan of Care" for details.                Laboratory Chemistry  Inflammation Markers (CRP: Acute Phase) (ESR: Chronic Phase) Lab Results  Component Value Date   CRP 9 11/26/2017   ESRSEDRATE 16 (H) 11/26/2017   LATICACIDVEN 1.1 08/11/2017                         Rheumatology Markers No results found.  Renal Markers Lab Results  Component Value Date   BUN 15 03/13/2018   CREATININE 1.54 (H) 03/13/2018   BCR 7 (L) 11/26/2017   GFRAA >60 03/13/2018   GFRNONAA 52 (L) 03/13/2018                             Hepatic Markers Lab Results  Component Value Date   AST  25 03/13/2018   ALT 25 03/13/2018   ALBUMIN 3.6 03/13/2018                        Neuropathy Markers Lab Results  Component Value Date   VITAMINB12 319 11/26/2017   HIV Non Reactive 08/12/2017                        Hematology Parameters Lab Results  Component Value Date   PLT 144 (L) 03/23/2018   HGB 13.2 03/23/2018   HCT 39.1 03/23/2018                        CV Markers Lab Results  Component Value Date   TROPONINI <0.03 08/23/2017                         Note: Lab results reviewed.  Recent Imaging Results   Results for orders placed in visit on 03/24/18  DG C-Arm 1-60 Min-No Report   Narrative Fluoroscopy was utilized by the requesting physician.  No radiographic  interpretation.    Interpretation Report: Fluoroscopy was used during the procedure to assist with needle guidance. The images were interpreted intraoperatively by the requesting physician.  Meds    Current Outpatient Medications:  .  acetaminophen (TYLENOL) 325 MG tablet, Take 2 tablets (650 mg total) by mouth every 6 (six) hours as needed for mild pain (or Fever >/= 101)., Disp: , Rfl:  .  acyclovir (ZOVIRAX) 200 MG capsule, Take 200 mg by mouth 2 (two) times daily. , Disp: , Rfl:  .  amoxicillin-clavulanate (AUGMENTIN) 875-125 MG tablet, Take 1 tablet by mouth 2 (two) times daily., Disp: 14 tablet, Rfl: 0 .  aspirin EC 81 MG tablet, Take 81 mg by mouth daily., Disp: , Rfl:  .  baclofen (LIORESAL) 10 MG tablet, Take 1 tablet (10 mg total) by mouth 3 (three) times daily., Disp: 90 tablet, Rfl: 0 .  gabapentin (NEURONTIN) 600 MG tablet, Take 1 tablet (600 mg total) by mouth 3 (three) times daily., Disp: 90 tablet, Rfl: 0 .  lenalidomide (REVLIMID) 5 MG capsule, TAKE 1 CAPSULE BY MOUTH ONE TIME DAILY, Disp: 28 capsule, Rfl: 3 .  meloxicam (MOBIC) 15 MG tablet, Take 1 tablet (15 mg total) by mouth daily., Disp: 30 tablet, Rfl: 0 .  oxyCODONE (OXY IR/ROXICODONE) 5 MG immediate release tablet, Take 0.5 tablets (2.5 mg total) by mouth 2 (two) times daily., Disp: 30 tablet, Rfl: 0 .  pantoprazole (PROTONIX) 40 MG tablet, Take 40 mg by mouth daily., Disp: , Rfl:  .  predniSONE (DELTASONE) 10 MG tablet, Take 6 tabs day 1, 5 tabs day 2, 4 tabs day 3, 3 tabs day 4, 2 tabs day 5, 1 tab day 6, Disp: 21 tablet, Rfl: 0 .  pregabalin (LYRICA) 150 MG capsule, Take 1 capsule (150 mg total) by mouth 2 (two) times daily., Disp: 60 capsule, Rfl: 0 .  promethazine (PHENERGAN) 25 MG tablet, , Disp: , Rfl: 0 .  Vitamin D, Ergocalciferol, (DRISDOL) 50000 units CAPS capsule, Take 50,000 Units by mouth every 7 (seven) days. Takes on Monday, Disp: , Rfl:   ROS  Constitutional: Denies any fever or chills Gastrointestinal: No reported hemesis, hematochezia, vomiting, or acute GI distress Musculoskeletal: Denies any acute onset joint swelling, redness, loss of ROM, or weakness Neurological: No reported episodes of  acute onset apraxia, aphasia, dysarthria,  agnosia, amnesia, paralysis, loss of coordination, or loss of consciousness  Allergies  James House has No Known Allergies.  PFSH  Drug: James House  reports no history of drug use. Alcohol:  reports no history of alcohol use. Tobacco:  reports that he quit smoking about 4 years ago. His smokeless tobacco use includes snuff. Medical:  has a past medical history of CKD (chronic kidney disease) stage 3, GFR 30-59 ml/min ([place]), Hypertension, Multiple myeloma ([place]) (08/11/2017), Neuropathy, and Pneumonia. Surgical: James House  has a past surgical history that includes Joint replacement; Knee surgery (Left); and Abdominal surgery. Family: family history includes COPD in his maternal grandmother and mother; Cancer in his maternal uncle; Cancer (age of onset: 19) in his maternal grandmother; Cancer (age of onset: 55) in his mother; Diabetes in his maternal grandmother and mother; Hyperlipidemia in his mother; Hypertension in his maternal grandmother and mother.  Constitutional Exam  General appearance: Well nourished, well developed, and well hydrated. In no apparent acute distress There were no vitals filed for this visit. BMI Assessment: Estimated body mass index is 23.34 kg/m as calculated from the following:   Height as of 04/02/18: 5' 7" (1.702 m).   Weight as of 04/02/18: 149 lb (67.6 kg).  BMI interpretation table: BMI level Category Range association with higher incidence of chronic pain  <18 kg/m2 Underweight   18.5-24.9 kg/m2 Ideal body weight   25-29.9 kg/m2 Overweight Increased incidence by 20%  30-34.9 kg/m2 Obese (Class I) Increased incidence by 68%  35-39.9 kg/m2 Severe obesity (Class II) Increased incidence by 136%  >40 kg/m2 Extreme obesity (Class III) Increased incidence by 254%   Patient's current BMI Ideal Body weight  There is no height or weight on file to calculate BMI. Ideal body weight: 66.1 kg (145 lb 11.6 oz) Adjusted ideal body  weight: 66.7 kg (147 lb 0.6 oz)   BMI Readings from Last 4 Encounters:  04/02/18 23.34 kg/m  03/24/18 24.28 kg/m  03/13/18 24.42 kg/m  03/05/18 23.49 kg/m   Wt Readings from Last 4 Encounters:  04/02/18 149 lb (67.6 kg)  03/24/18 155 lb (70.3 kg)  03/13/18 155 lb 14.4 oz (70.7 kg)  03/05/18 150 lb (68 kg)  Psych/Mental status: Alert, oriented x 3 (person, place, & time)       Eyes: PERLA Respiratory: No evidence of acute respiratory distress  Cervical Spine Area Exam  Skin & Axial Inspection: No masses, redness, edema, swelling, or associated skin lesions Alignment: Symmetrical Functional ROM: Unrestricted ROM      Stability: No instability detected Muscle Tone/Strength: Functionally intact. No obvious neuro-muscular anomalies detected. Sensory (Neurological): Unimpaired Palpation: No palpable anomalies              Upper Extremity (UE) Exam    Side: Right upper extremity  Side: Left upper extremity  Skin & Extremity Inspection: Skin color, temperature, and hair growth are WNL. No peripheral edema or cyanosis. No masses, redness, swelling, asymmetry, or associated skin lesions. No contractures.  Skin & Extremity Inspection: Skin color, temperature, and hair growth are WNL. No peripheral edema or cyanosis. No masses, redness, swelling, asymmetry, or associated skin lesions. No contractures.  Functional ROM: Unrestricted ROM          Functional ROM: Unrestricted ROM          Muscle Tone/Strength: Functionally intact. No obvious neuro-muscular anomalies detected.  Muscle Tone/Strength: Functionally intact. No obvious neuro-muscular anomalies detected.  Sensory (Neurological): Unimpaired  Sensory (Neurological): Unimpaired          Palpation: No palpable anomalies              Palpation: No palpable anomalies              Provocative Test(s):  Phalen's test: deferred Tinel's test: deferred Apley's scratch test (touch opposite shoulder):  Action 1 (Across chest):  deferred Action 2 (Overhead): deferred Action 3 (LB reach): deferred   Provocative Test(s):  Phalen's test: deferred Tinel's test: deferred Apley's scratch test (touch opposite shoulder):  Action 1 (Across chest): deferred Action 2 (Overhead): deferred Action 3 (LB reach): deferred    Thoracic Spine Area Exam  Skin & Axial Inspection: No masses, redness, or swelling Alignment: Symmetrical Functional ROM: Unrestricted ROM Stability: No instability detected Muscle Tone/Strength: Functionally intact. No obvious neuro-muscular anomalies detected. Sensory (Neurological): Unimpaired Muscle strength & Tone: No palpable anomalies  Lumbar Spine Area Exam  Skin & Axial Inspection: No masses, redness, or swelling Alignment: Symmetrical Functional ROM: Unrestricted ROM       Stability: No instability detected Muscle Tone/Strength: Functionally intact. No obvious neuro-muscular anomalies detected. Sensory (Neurological): Unimpaired Palpation: No palpable anomalies       Provocative Tests: Hyperextension/rotation test: deferred today       Lumbar quadrant test (Kemp's test): deferred today       Lateral bending test: deferred today       Patrick's Maneuver: deferred today                   FABER* test: deferred today                   S-I anterior distraction/compression test: deferred today         S-I lateral compression test: deferred today         S-I Thigh-thrust test: deferred today         S-I Gaenslen's test: deferred today         *(Flexion, ABduction and External Rotation)  Gait & Posture Assessment  Ambulation: Unassisted Gait: Relatively normal for age and body habitus Posture: WNL   Lower Extremity Exam    Side: Right lower extremity  Side: Left lower extremity  Stability: No instability observed          Stability: No instability observed          Skin & Extremity Inspection: Skin color, temperature, and hair growth are WNL. No peripheral edema or cyanosis. No masses,  redness, swelling, asymmetry, or associated skin lesions. No contractures.  Skin & Extremity Inspection: Skin color, temperature, and hair growth are WNL. No peripheral edema or cyanosis. No masses, redness, swelling, asymmetry, or associated skin lesions. No contractures.  Functional ROM: Unrestricted ROM                  Functional ROM: Unrestricted ROM                  Muscle Tone/Strength: Functionally intact. No obvious neuro-muscular anomalies detected.  Muscle Tone/Strength: Functionally intact. No obvious neuro-muscular anomalies detected.  Sensory (Neurological): Unimpaired        Sensory (Neurological): Unimpaired        DTR: Patellar: deferred today Achilles: deferred today Plantar: deferred today  DTR: Patellar: deferred today Achilles: deferred today Plantar: deferred today  Palpation: No palpable anomalies  Palpation: No palpable anomalies   Assessment  Primary Diagnosis & Pertinent Problem List: The primary encounter diagnosis was Chronic low back pain (  Primary Area of Pain) (Bilateral) (L>R) w/o sciatica. Diagnoses of Chronic lower extremity pain (Secondary Area of Pain) (Bilateral) (L>R), Chronic knee pain (Tertiary Area of Pain) (Bilateral) (L>R), Chemotherapy-induced peripheral neuropathy (HCC), Spondylosis without myelopathy or radiculopathy, lumbar region, and Lumbar facet syndrome (Bilateral) (L>R) were also pertinent to this visit.  Status Diagnosis  Controlled Controlled Controlled 1. Chronic low back pain (Primary Area of Pain) (Bilateral) (L>R) w/o sciatica   2. Chronic lower extremity pain (Secondary Area of Pain) (Bilateral) (L>R)   3. Chronic knee pain (Tertiary Area of Pain) (Bilateral) (L>R)   4. Chemotherapy-induced peripheral neuropathy (HCC)   5. Spondylosis without myelopathy or radiculopathy, lumbar region   6. Lumbar facet syndrome (Bilateral) (L>R)     Problems updated and reviewed during this visit: No problems updated. Plan of Care   Pharmacotherapy (Medications Ordered): No orders of the defined types were placed in this encounter.  Medications administered today: [name] had no medications administered during this visit.  Procedure Orders    No procedure(s) ordered today   Lab Orders  No laboratory test(s) ordered today   Imaging Orders  No imaging studies ordered today   Referral Orders  No referral(s) requested today   Interventional management options: Planned, scheduled, and/or pending:   Diagnostic bilateral lumbar facet block#2under fluoroscopic guidance and IV sedation   Considering:   DiagnosticbilateralLESI Diagnosticbilateral lumbar facet block Possible bilateral lumbar facet RFA  Diagnostic bilateral sacroiliac joint block  Possible bilateral sacroiliac joint RFA  Diagnostic right knee Hyalgan series Diagnosticright knee intra-articular knee injection Diagnostic left knee genicular nerve block   Palliative PRN treatment(s):   None at this time   Provider-requested follow-up: No follow-ups on file.  Future Appointments  Date Time Provider [place]  04/08/2018  8:15 AM Milinda Pointer, MD [facility] None  04/10/2018  9:30 AM [facility] None  04/10/2018 10:00 AM [facility] INJECTION [facility] None  04/29/2018  9:15 AM Vevelyn Francois, NP [facility] None  05/25/2018 10:00 AM Bernardo Heater, Ronda Fairly, MD [facility] None  06/12/2018  9:30 AM [facility] None  06/12/2018 10:00 AM Sindy Guadeloupe, MD [facility] None  06/12/2018 10:30 AM [facility] INJE[facility] None   Primary Care Physician: Pleas Koch, NP Location: [facility] Note by: Gaspar Cola, MD Date: 04/08/2018; Time: 4:23 PM

## 2018-04-08 ENCOUNTER — Ambulatory Visit: Payer: No Typology Code available for payment source | Admitting: Pain Medicine

## 2018-04-09 LAB — TOXASSURE SELECT 13 (MW), URINE

## 2018-04-10 ENCOUNTER — Telehealth: Payer: Self-pay

## 2018-04-10 ENCOUNTER — Ambulatory Visit: Payer: No Typology Code available for payment source | Admitting: Oncology

## 2018-04-10 ENCOUNTER — Other Ambulatory Visit: Payer: No Typology Code available for payment source

## 2018-04-10 ENCOUNTER — Inpatient Hospital Stay: Payer: No Typology Code available for payment source

## 2018-04-10 DIAGNOSIS — C9001 Multiple myeloma in remission: Secondary | ICD-10-CM

## 2018-04-10 LAB — COMPREHENSIVE METABOLIC PANEL
ALT: 27 U/L (ref 0–44)
AST: 20 U/L (ref 15–41)
Albumin: 3.8 g/dL (ref 3.5–5.0)
Alkaline Phosphatase: 66 U/L (ref 38–126)
Anion gap: 6 (ref 5–15)
BUN: 22 mg/dL — ABNORMAL HIGH (ref 6–20)
CO2: 27 mmol/L (ref 22–32)
Calcium: 8.4 mg/dL — ABNORMAL LOW (ref 8.9–10.3)
Chloride: 108 mmol/L (ref 98–111)
Creatinine, Ser: 1.59 mg/dL — ABNORMAL HIGH (ref 0.61–1.24)
GFR calc Af Amer: 59 mL/min — ABNORMAL LOW (ref >60)
GFR, EST NON AFRICAN AMERICAN: 51 mL/min — AB (ref >60)
Glucose, Bld: 86 mg/dL (ref 70–99)
POTASSIUM: 4.1 mmol/L (ref 3.5–5.1)
Sodium: 141 mmol/L (ref 135–145)
Total Bilirubin: 0.7 mg/dL (ref 0.3–1.2)
Total Protein: 6.7 g/dL (ref 6.5–8.1)

## 2018-04-10 LAB — CBC WITH DIFFERENTIAL/PLATELET
Abs Immature Granulocytes: 0.04 10*3/uL (ref 0.00–0.07)
Basophils Absolute: 0.1 10*3/uL (ref 0.0–0.1)
Basophils Relative: 1 %
Eosinophils Absolute: 0.3 10*3/uL (ref 0.0–0.5)
Eosinophils Relative: 7 %
HCT: 39 % (ref 39.0–52.0)
Hemoglobin: 13.2 g/dL (ref 13.0–17.0)
Immature Granulocytes: 1 %
Lymphocytes Relative: 13 %
Lymphs Abs: 0.5 10*3/uL — ABNORMAL LOW (ref 0.7–4.0)
MCH: 34 pg (ref 26.0–34.0)
MCHC: 33.8 g/dL (ref 30.0–36.0)
MCV: 100.5 fL — ABNORMAL HIGH (ref 80.0–100.0)
Monocytes Absolute: 0.3 10*3/uL (ref 0.1–1.0)
Monocytes Relative: 8 %
Neutro Abs: 2.9 10*3/uL (ref 1.7–7.7)
Neutrophils Relative %: 70 %
Platelets: 138 10*3/uL — ABNORMAL LOW (ref 150–400)
RBC: 3.88 MIL/uL — AB (ref 4.22–5.81)
RDW: 15.7 % — ABNORMAL HIGH (ref 11.5–15.5)
WBC: 4.2 10*3/uL (ref 4.0–10.5)
nRBC: 0 % (ref 0.0–0.2)

## 2018-04-10 MED ORDER — DENOSUMAB 120 MG/1.7ML ~~LOC~~ SOLN
120.0000 mg | SUBCUTANEOUS | Status: DC
  Administered 2018-04-10: 120 mg via SUBCUTANEOUS

## 2018-04-10 NOTE — Progress Notes (Signed)
OK PER RAO TO GIVE XGEVA WITH CALCIUM AS 8.4

## 2018-04-10 NOTE — Telephone Encounter (Signed)
Mickel Baas called and asked will Ingram still get his Delton See due to his calcium was 8.4. Per Dr Janese Banks yes the patient will get Delton See shot today.

## 2018-04-13 ENCOUNTER — Other Ambulatory Visit: Payer: Self-pay | Admitting: *Deleted

## 2018-04-13 ENCOUNTER — Telehealth: Payer: Self-pay | Admitting: *Deleted

## 2018-04-13 DIAGNOSIS — Z9481 Bone marrow transplant status: Secondary | ICD-10-CM | POA: Insufficient documentation

## 2018-04-13 DIAGNOSIS — Z299 Encounter for prophylactic measures, unspecified: Secondary | ICD-10-CM

## 2018-04-13 LAB — KAPPA/LAMBDA LIGHT CHAINS
Kappa free light chain: 23.9 mg/L — ABNORMAL HIGH (ref 3.3–19.4)
Kappa, lambda light chain ratio: 1.18 (ref 0.26–1.65)
Lambda free light chains: 20.2 mg/L (ref 5.7–26.3)

## 2018-04-13 NOTE — Telephone Encounter (Signed)
Called pt. To let him know that patient will need to come in 12/26 and register at registration desk. Then come upstairs and let me know he is here and I will release the vaccinations and give them to him. I tried to respond to him on my chart but it tells me he is no longer a my chart authorization. The patient says he still has my chart access but I told him it will not allow me to send him a message

## 2018-04-14 LAB — MULTIPLE MYELOMA PANEL, SERUM
ALBUMIN SERPL ELPH-MCNC: 3.7 g/dL (ref 2.9–4.4)
Albumin/Glob SerPl: 1.5 (ref 0.7–1.7)
Alpha 1: 0.2 g/dL (ref 0.0–0.4)
Alpha2 Glob SerPl Elph-Mcnc: 0.7 g/dL (ref 0.4–1.0)
B-Globulin SerPl Elph-Mcnc: 0.9 g/dL (ref 0.7–1.3)
Gamma Glob SerPl Elph-Mcnc: 0.8 g/dL (ref 0.4–1.8)
Globulin, Total: 2.5 g/dL (ref 2.2–3.9)
IgA: 197 mg/dL (ref 90–386)
IgG (Immunoglobin G), Serum: 886 mg/dL (ref 700–1600)
IgM (Immunoglobulin M), Srm: 41 mg/dL (ref 20–172)
Total Protein ELP: 6.2 g/dL (ref 6.0–8.5)

## 2018-04-16 ENCOUNTER — Inpatient Hospital Stay: Payer: No Typology Code available for payment source

## 2018-04-16 DIAGNOSIS — C9001 Multiple myeloma in remission: Secondary | ICD-10-CM | POA: Diagnosis not present

## 2018-04-16 DIAGNOSIS — Z299 Encounter for prophylactic measures, unspecified: Secondary | ICD-10-CM

## 2018-04-16 DIAGNOSIS — Z9481 Bone marrow transplant status: Secondary | ICD-10-CM

## 2018-04-16 MED ORDER — PNEUMOCOCCAL 13-VAL CONJ VACC IM SUSP
0.5000 mL | Freq: Once | INTRAMUSCULAR | Status: AC
  Administered 2018-04-16: 0.5 mL via INTRAMUSCULAR
  Filled 2018-04-16: qty 0.5

## 2018-04-16 MED ORDER — TETANUS-DIPHTH-ACELL PERTUSSIS 5-2.5-18.5 LF-MCG/0.5 IM SUSP
0.5000 mL | Freq: Once | INTRAMUSCULAR | Status: AC
  Administered 2018-04-16: 0.5 mL via INTRAMUSCULAR
  Filled 2018-04-16: qty 0.5

## 2018-04-17 ENCOUNTER — Encounter: Payer: Self-pay | Admitting: Oncology

## 2018-04-17 ENCOUNTER — Encounter: Payer: Self-pay | Admitting: *Deleted

## 2018-04-21 ENCOUNTER — Encounter: Payer: Self-pay | Admitting: Internal Medicine

## 2018-04-21 ENCOUNTER — Telehealth: Payer: Self-pay

## 2018-04-21 ENCOUNTER — Ambulatory Visit (INDEPENDENT_AMBULATORY_CARE_PROVIDER_SITE_OTHER): Payer: No Typology Code available for payment source | Admitting: Internal Medicine

## 2018-04-21 ENCOUNTER — Other Ambulatory Visit: Payer: Self-pay | Admitting: *Deleted

## 2018-04-21 VITALS — BP 132/68 | HR 78 | Temp 98.2°F | Wt 150.0 lb

## 2018-04-21 DIAGNOSIS — B9789 Other viral agents as the cause of diseases classified elsewhere: Secondary | ICD-10-CM

## 2018-04-21 DIAGNOSIS — C9001 Multiple myeloma in remission: Secondary | ICD-10-CM

## 2018-04-21 DIAGNOSIS — J069 Acute upper respiratory infection, unspecified: Secondary | ICD-10-CM | POA: Diagnosis not present

## 2018-04-21 MED ORDER — ALBUTEROL SULFATE HFA 108 (90 BASE) MCG/ACT IN AERS: 2.0000 | INHALATION_SPRAY | Freq: Four times a day (QID) | RESPIRATORY_TRACT | 0 refills | Status: DC | PRN

## 2018-04-21 MED ORDER — HYDROCODONE-HOMATROPINE 5-1.5 MG/5ML PO SYRP: 5.0000 mL | ORAL_SOLUTION | Freq: Three times a day (TID) | ORAL | 0 refills | Status: DC | PRN

## 2018-04-21 MED ORDER — LENALIDOMIDE 5 MG PO CAPS: ORAL_CAPSULE | ORAL | 0 refills | Status: DC

## 2018-04-21 NOTE — Telephone Encounter (Signed)
Noted  

## 2018-04-21 NOTE — Telephone Encounter (Signed)
Needs refill of Revlimid

## 2018-04-21 NOTE — Patient Instructions (Signed)

## 2018-04-21 NOTE — Progress Notes (Signed)
HPI  Pt presents to the clinic today with c/o runny nose, nasal congestion, ear fullness and cough. He reports this started 4 days ago. He is blowing clear mucous out of his nose. He denies ear pain, discharge or decreased hearing. The cough is productive of yellow mucous. He denies fever, chills or body aches. He has not tried anything OTC. He has not had sick contacts. He is following oncology for multiple myeloma. He was treated with Augmentin and Prednisone 02/2018 for a presumed bacterial infection.  Review of Systems      Past Medical History:  Diagnosis Date  . CKD (chronic kidney disease) stage 3, GFR 30-59 ml/min (HCC)   . Hypertension   . Multiple myeloma ([place]) 08/11/2017  . Neuropathy   . Pneumonia     Family History  Problem Relation Age of Onset  . Cancer Mother 68       Pancreatic  . COPD Mother   . Diabetes Mother   . Hyperlipidemia Mother   . Hypertension Mother   . Cancer Maternal Uncle        pancreatic  . Cancer Maternal Grandmother 65       colon  . COPD Maternal Grandmother   . Diabetes Maternal Grandmother   . Hypertension Maternal Grandmother     Social History   Socioeconomic History  . Marital status: Married    Spouse name: [name]  . Number of children: 3  . Years of education: Not on file  . Highest education level: Not on file  Occupational History  . Not on file  Social Needs  . Financial resource strain: Not hard at all  . Food insecurity:    Worry: Never true    Inability: Never true  . Transportation needs:    Medical: No    Non-medical: No  Tobacco Use  . Smoking status: Former Smoker    Last attempt to quit: 08/19/2013    Years since quitting: 4.6  . Smokeless tobacco: Current User    Types: Snuff  Substance and Sexual Activity  . Alcohol use: No  . Drug use: No  . Sexual activity: Yes  Lifestyle  . Physical activity:    Days per week: 7 days    Minutes per session: 30 min  . Stress: Only a little  Relationships  .  Social connections:    Talks on phone: Once a week    Gets together: Once a week    Attends religious service: More than 4 times per year    Active member of club or organization: No    Attends meetings of clubs or organizations: Never    Relationship status: Married  . Intimate partner violence:    Fear of current or ex partner: No    Emotionally abused: No    Physically abused: No    Forced sexual activity: No  Other Topics Concern  . Not on file  Social History Narrative   Live in private residence with spouse and mother in law    No Known Allergies   Constitutional: Denies headache, fatigue, fever or abrupt weight changes.  HEENT:  Positive runny nose, nasal congestion. Denies eye redness, eye pain, pressure behind the eyes, facial pain, ear pain, ringing in the ears, wax buildup, or bloody nose. Respiratory: Positive cough. Denies difficulty breathing or shortness of breath.  Cardiovascular: Denies chest pain, chest tightness, palpitations or swelling in the hands or feet.   No other specific complaints in a complete review of  systems (except as listed in HPI above).  Objective:   BP 132/68   Pulse 78   Temp 98.2 F (36.8 C) (Oral)   Wt 150 lb (68 kg)   SpO2 98%   BMI 23.49 kg/m  Wt Readings from Last 3 Encounters:  04/21/18 150 lb (68 kg)  04/02/18 149 lb (67.6 kg)  03/24/18 155 lb (70.3 kg)     General: Appears his stated age, well developed, well nourished in NAD. HEENT: Head: normal shape and size, no sinus tenderness noted; Ears: Tm's gray and intact, normal light reflex; Nose: mucosa pink and moist, septum midline; Throat/Mouth: + PND. Teeth present, mucosa erythematous and moist, no exudate noted, no lesions or ulcerations noted.  Neck: No cervical lymphadenopathy.  Cardiovascular: Normal rate and rhythm. S1,S2 noted.  No murmur, rubs or gallops noted.  Pulmonary/Chest: Normal effort and positive vesicular breath sounds. No respiratory distress. No  wheezes, rales or ronchi noted.       Assessment & Plan:   Viral Upper Respiratory Infection with [place]:  Get some rest and drink plenty of water Start Zyrtec and Flonase OTC eRx for Albuterol inhaler as needed eRx for Hycodan cough syrup  RTC as needed or if symptoms persist.   Webb Silversmith, NP

## 2018-04-21 NOTE — Telephone Encounter (Signed)
James House called and wanted to let you know that he has bronchitis and his pcp wrote him a script for a cough medicine with codeine in it.

## 2018-04-29 ENCOUNTER — Encounter: Payer: No Typology Code available for payment source | Admitting: Nurse Practitioner

## 2018-05-03 NOTE — Progress Notes (Signed)
Patient's Name: James House.  MRN: [id number]  Referring Provider: Pleas Koch, NP  DOB: 1971-03-12  PCP: Pleas Koch, NP  DOS: 05/04/2018  Note by: Gaspar Cola, MD  Service setting: Ambulatory outpatient  Specialty: Interventional Pain Management  Location: [facility] (AMB) Pain Management Facility    Patient type: Established   Primary Reason(s) for Visit: Encounter for post-procedure evaluation of chronic illness with mild to moderate exacerbation CC: Follow-up (following procedire on 03/24/18) and Medication Refill (Oxycodone, Baclofen and pregabalin )  HPI  James House is a 48 y.o. year old, male patient, who comes today for a post-procedure evaluation. He has Severe sepsis ([place]); Hyponatremia; Multiple myeloma ([place]); Syncope; CKD (chronic kidney disease), stage III ([place]); HTN (hypertension); Vitamin B 12 deficiency; Chronic low back pain (Primary Area of Pain) (Bilateral) (L>R) w/ sciatica (Bilateral); Chronic lower extremity pain (Secondary Area of Pain) (Bilateral) (L>R); Chronic knee pain (Tertiary Area of Pain) (Bilateral) (L>R); Chronic pain syndrome; Long term current use of opiate analgesic; Pharmacologic therapy; Disorder of skeletal system; Problems influencing health status; Chemotherapy-induced peripheral neuropathy ([place]); Neurogenic pain; Neuropathic pain; History of Partial knee replacement (Left); Chronic knee pain s/p partial replacement (Left); Elevated sedimentation rate; Hypocalcemia; Osteoarthritis of knee (Right); Chronic hip pain (Bilateral) (R>L); Chronic sacroiliac joint pain (Bilateral) (R>L); Other specified dorsopathies, sacral and sacrococcygeal region; Lumbar facet syndrome (Bilateral) (L>R); Spondylosis without myelopathy or radiculopathy, lumbar region; Chronic lumbar radiculopathy (by EMG/PNCV) (L5) (Right); Chronic low back pain (Primary Area of Pain) (Bilateral) (L>R) w/o sciatica; Spasm of back muscles; Chronic musculoskeletal pain; Need for  prophylactic measure; and Status post autologous bone marrow transplant ([place]) on their problem list. His primarily concern today is the Follow-up (following procedire on 03/24/18) and Medication Refill (Oxycodone, Baclofen and pregabalin )  Pain Assessment: Location: Lower Back Radiating: Radiates down calves bilaterally  Onset: More than a month ago Duration: Chronic pain Quality: Constant, Dull Severity: 6 /10 (subjective, self-reported pain score)  Note: Reported level is inconsistent with clinical observations. Clinically the patient looks like a 3/10 A 3/10 is viewed as "Moderate" and described as significantly interfering with activities of daily living (ADL). It becomes difficult to feed, bathe, get dressed, get on and off the toilet or to perform personal hygiene functions. Difficult to get in and out of bed or a chair without assistance. Very distracting. With effort, it can be ignored when deeply involved in activities. Information on the proper use of the pain scale provided to the patient today. When using our objective Pain Scale, levels between 6 and 10/10 are said to belong in an emergency room, as it progressively worsens from a 6/10, described as severely limiting, requiring emergency care not usually available at an outpatient pain management facility. At a 6/10 level, communication becomes difficult and requires great effort. Assistance to reach the emergency department may be required. Facial flushing and profuse sweating along with potentially dangerous increases in heart rate and blood pressure will be evident. Effect on ADL: " I can;t walk for a long periods of time. Can't sit for a long periods of time and painful to get up straight"  Timing: Constant Modifying factors: Medications and procedures  BP: (!) 129/94  HR: 82  James House comes in today for post-procedure evaluation.  Further details on both, my assessment(s), as well as the proposed treatment plan, please see  below.  Post-Procedure Assessment  03/24/2018 Procedure: Diagnostic bilateral lumbar facet block#1under fluoroscopic guidance and IV sedation Pre-procedure pain score:  7/10 Post-procedure pain score: 0/10 (100% relief) Influential Factors: BMI: 23.49 kg/m Intra-procedural challenges: None observed.         Assessment challenges: None detected.              Reported side-effects: None.        Post-procedural adverse reactions or complications: None reported         Sedation: Sedation provided. When no sedatives are used, the analgesic levels obtained are directly associated to the effectiveness of the local anesthetics. However, when sedation is provided, the level of analgesia obtained during the initial 1 hour following the intervention, is believed to be the result of a combination of factors. These factors may include, but are not limited to: 1. The effectiveness of the local anesthetics used. 2. The effects of the analgesic(s) and/or anxiolytic(s) used. 3. The degree of discomfort experienced by the patient at the time of the procedure. 4. The patients ability and reliability in recalling and recording the events. 5. The presence and influence of possible secondary gains and/or psychosocial factors. Reported result: Relief experienced during the 1st hour after the procedure: 60 %(Lumbar Facet, Medial Branch Block(s) #1 on 03/24/2018) (Ultra-Short Term Relief)            Interpretative annotation: Clinically appropriate result. Analgesia during this period is likely to be Local Anesthetic and/or IV Sedative (Analgesic/Anxiolytic) related.          Effects of local anesthetic: The analgesic effects attained during this period are directly associated to the localized infiltration of local anesthetics and therefore cary significant diagnostic value as to the etiological location, or anatomical origin, of the pain. Expected duration of relief is directly dependent on the pharmacodynamics of  the local anesthetic used. Long-acting (4-6 hours) anesthetics used.  Reported result: Relief during the next 4 to 6 hour after the procedure: 50 % (Short-Term Relief)            Interpretative annotation: Clinically appropriate result. Analgesia during this period is likely to be Local Anesthetic-related.          Long-term benefit: Defined as the period of time past the expected duration of local anesthetics (1 hour for short-acting and 4-6 hours for long-acting). With the possible exception of prolonged sympathetic blockade from the local anesthetics, benefits during this period are typically attributed to, or associated with, other factors such as analgesic sensory neuropraxia, antiinflammatory effects, or beneficial biochemical changes provided by agents other than the local anesthetics.  Reported result: Extended relief following procedure: 40 % (Long-Term Relief)            Interpretative annotation: Clinically possible results. Partial relief. No permanent benefit expected. Inflammation plays a part in the etiology to the pain.          Current benefits: Defined as reported results that persistent at this point in time.   Analgesia: <25 % Mr. Leiva reports improvement of axial symptoms.  He indicates that there was an area lower in the back where he did not get significant benefit.  During today's physical exam he tested positive for bilateral hip joint pain and he also indicated that this is the type of pain that did not go away with the prior diagnostic injection. Function: Somewhat improved ROM: Somewhat improved Interpretative annotation: Recurrence of symptoms. No permanent benefit expected. Effective diagnostic intervention.          Interpretation: Results would suggest a successful diagnostic intervention.  Plan:  Based on the above results, I have decided to bring him back for a diagnostic bilateral intra-articular hip joint injection under fluoroscopic guidance and  IV sedation.  I believe that if I address this issue, then he may be able to get better results with the facet block.                Laboratory Chemistry  Inflammation Markers (CRP: Acute Phase) (ESR: Chronic Phase) Lab Results  Component Value Date   CRP 9 11/26/2017   ESRSEDRATE 16 (H) 11/26/2017   LATICACIDVEN 1.1 08/11/2017                         Rheumatology Markers No results found.  Renal Markers Lab Results  Component Value Date   BUN 22 (H) 04/10/2018   CREATININE 1.59 (H) 04/10/2018   BCR 7 (L) 11/26/2017   GFRAA 59 (L) 04/10/2018   GFRNONAA 51 (L) 04/10/2018                             Hepatic Markers Lab Results  Component Value Date   AST 20 04/10/2018   ALT 27 04/10/2018   ALBUMIN 3.8 04/10/2018                        Neuropathy Markers Lab Results  Component Value Date   VITAMINB12 319 11/26/2017   HIV Non Reactive 08/12/2017                        Hematology Parameters Lab Results  Component Value Date   PLT 138 (L) 04/10/2018   HGB 13.2 04/10/2018   HCT 39.0 04/10/2018                        CV Markers Lab Results  Component Value Date   TROPONINI <0.03 08/23/2017                         Note: Lab results reviewed.  Recent Imaging Results   Results for orders placed in visit on 03/24/18  DG C-Arm 1-60 Min-No Report   Narrative Fluoroscopy was utilized by the requesting physician.  No radiographic  interpretation.    Interpretation Report: Fluoroscopy was used during the procedure to assist with needle guidance. The images were interpreted intraoperatively by the requesting physician.  Meds   Current Outpatient Medications:  .  acetaminophen (TYLENOL) 325 MG tablet, Take 2 tablets (650 mg total) by mouth every 6 (six) hours as needed for mild pain (or Fever >/= 101)., Disp: , Rfl:  .  acyclovir (ZOVIRAX) 200 MG capsule, Take 200 mg by mouth 2 (two) times daily. , Disp: , Rfl:  .  albuterol (PROVENTIL HFA;VENTOLIN HFA) 108 (90  Base) MCG/ACT inhaler, Inhale 2 puffs into the lungs every 6 (six) hours as needed for wheezing or shortness of breath., Disp: 1 Inhaler, Rfl: 0 .  aspirin EC 81 MG tablet, Take 81 mg by mouth daily., Disp: , Rfl:  .  baclofen (LIORESAL) 10 MG tablet, Take 1 tablet (10 mg total) by mouth 4 (four) times daily., Disp: 120 tablet, Rfl: 2 .  HYDROcodone-homatropine (HYCODAN) 5-1.5 MG/5ML syrup, Take 5 mLs by mouth every 8 (eight) hours as needed., Disp: 75 mL, Rfl: 0 .  lenalidomide (REVLIMID) 5 MG capsule, TAKE 1  CAPSULE BY MOUTH ONE TIME DAILY, Disp: 28 capsule, Rfl: 0 .  oxyCODONE (OXY IR/ROXICODONE) 5 MG immediate release tablet, Take 0.5 tablets (2.5 mg total) by mouth 2 (two) times daily for 30 days. Must last 30 days., Disp: 30 tablet, Rfl: 0 .  pantoprazole (PROTONIX) 40 MG tablet, Take 40 mg by mouth daily., Disp: , Rfl:  .  pregabalin (LYRICA) 150 MG capsule, Take 1 capsule (150 mg total) by mouth 3 (three) times daily., Disp: 90 capsule, Rfl: 2 .  promethazine (PHENERGAN) 25 MG tablet, , Disp: , Rfl: 0 .  Vitamin D, Ergocalciferol, (DRISDOL) 50000 units CAPS capsule, Take 50,000 Units by mouth every 7 (seven) days. Takes on Monday, Disp: , Rfl:  .  gabapentin (NEURONTIN) 600 MG tablet, Take 1 tablet (600 mg total) by mouth 3 (three) times daily., Disp: 90 tablet, Rfl: 2 .  [START ON 06/03/2018] OxyCODONE HCl, Abuse Deter, (OXAYDO) 5 MG TABA, Take 2.5 mg by mouth 2 (two) times daily as needed for up to 30 days. Must last 30 days., Disp: 30 tablet, Rfl: 0 .  [START ON 07/03/2018] OxyCODONE HCl, Abuse Deter, (OXAYDO) 5 MG TABA, Take 2.5 mg by mouth 2 (two) times daily as needed for up to 30 days. Must last 30 days., Disp: 30 tablet, Rfl: 0  ROS  Constitutional: Denies any fever or chills Gastrointestinal: No reported hemesis, hematochezia, vomiting, or acute GI distress Musculoskeletal: Denies any acute onset joint swelling, redness, loss of ROM, or weakness Neurological: No reported episodes of  acute onset apraxia, aphasia, dysarthria, agnosia, amnesia, paralysis, loss of coordination, or loss of consciousness  Allergies  Mr. Santillana has No Known Allergies.  PFSH  Drug: Mr. Imel  reports no history of drug use. Alcohol:  reports no history of alcohol use. Tobacco:  reports that he quit smoking about 4 years ago. His smokeless tobacco use includes snuff. Medical:  has a past medical history of CKD (chronic kidney disease) stage 3, GFR 30-59 ml/min ([place]), Hypertension, Multiple myeloma ([place]) (08/11/2017), Neuropathy, and Pneumonia. Surgical: Mr. Gladu  has a past surgical history that includes Joint replacement; Knee surgery (Left); and Abdominal surgery. Family: family history includes COPD in his maternal grandmother and mother; Cancer in his maternal uncle; Cancer (age of onset: 42) in his maternal grandmother; Cancer (age of onset: 38) in his mother; Diabetes in his maternal grandmother and mother; Hyperlipidemia in his mother; Hypertension in his maternal grandmother and mother.  Constitutional Exam  General appearance: Well nourished, well developed, and well hydrated. In no apparent acute distress Vitals:   05/04/18 0833  BP: (!) 129/94  Pulse: 82  Resp: 18  Temp: 98.4 F (36.9 C)  SpO2: 100%  Weight: 150 lb (68 kg)  Height: '5\' 7"'  (1.702 m)   BMI Assessment: Estimated body mass index is 23.49 kg/m as calculated from the following:   Height as of this encounter: '5\' 7"'  (1.702 m).   Weight as of this encounter: 150 lb (68 kg).  BMI interpretation table: BMI level Category Range association with higher incidence of chronic pain  <18 kg/m2 Underweight   18.5-24.9 kg/m2 Ideal body weight   25-29.9 kg/m2 Overweight Increased incidence by 20%  30-34.9 kg/m2 Obese (Class I) Increased incidence by 68%  35-39.9 kg/m2 Severe obesity (Class II) Increased incidence by 136%  >40 kg/m2 Extreme obesity (Class III) Increased incidence by 254%   Patient's current BMI Ideal Body  weight  Body mass index is 23.49 kg/m. Ideal body weight: 66.1 kg (  145 lb 11.6 oz) Adjusted ideal body weight: 66.9 kg (147 lb 7 oz)   BMI Readings from Last 4 Encounters:  05/04/18 23.49 kg/m  04/21/18 23.49 kg/m  04/02/18 23.34 kg/m  03/24/18 24.28 kg/m   Wt Readings from Last 4 Encounters:  05/04/18 150 lb (68 kg)  04/21/18 150 lb (68 kg)  04/02/18 149 lb (67.6 kg)  03/24/18 155 lb (70.3 kg)  Psych/Mental status: Alert, oriented x 3 (person, place, & time)       Eyes: PERLA Respiratory: No evidence of acute respiratory distress  Cervical Spine Area Exam  Skin & Axial Inspection: No masses, redness, edema, swelling, or associated skin lesions Alignment: Symmetrical Functional ROM: Unrestricted ROM      Stability: No instability detected Muscle Tone/Strength: Functionally intact. No obvious neuro-muscular anomalies detected. Sensory (Neurological): Unimpaired Palpation: No palpable anomalies              Upper Extremity (UE) Exam    Side: Right upper extremity  Side: Left upper extremity  Skin & Extremity Inspection: Skin color, temperature, and hair growth are WNL. No peripheral edema or cyanosis. No masses, redness, swelling, asymmetry, or associated skin lesions. No contractures.  Skin & Extremity Inspection: Skin color, temperature, and hair growth are WNL. No peripheral edema or cyanosis. No masses, redness, swelling, asymmetry, or associated skin lesions. No contractures.  Functional ROM: Unrestricted ROM          Functional ROM: Unrestricted ROM          Muscle Tone/Strength: Functionally intact. No obvious neuro-muscular anomalies detected.  Muscle Tone/Strength: Functionally intact. No obvious neuro-muscular anomalies detected.  Sensory (Neurological): Unimpaired          Sensory (Neurological): Unimpaired          Palpation: No palpable anomalies              Palpation: No palpable anomalies              Provocative Test(s):  Phalen's test: deferred Tinel's test:  deferred Apley's scratch test (touch opposite shoulder):  Action 1 (Across chest): deferred Action 2 (Overhead): deferred Action 3 (LB reach): deferred   Provocative Test(s):  Phalen's test: deferred Tinel's test: deferred Apley's scratch test (touch opposite shoulder):  Action 1 (Across chest): deferred Action 2 (Overhead): deferred Action 3 (LB reach): deferred    Thoracic Spine Area Exam  Skin & Axial Inspection: No masses, redness, or swelling Alignment: Symmetrical Functional ROM: Unrestricted ROM Stability: No instability detected Muscle Tone/Strength: Functionally intact. No obvious neuro-muscular anomalies detected. Sensory (Neurological): Unimpaired Muscle strength & Tone: No palpable anomalies  Lumbar Spine Area Exam  Skin & Axial Inspection: No masses, redness, or swelling Alignment: Symmetrical Functional ROM: Unrestricted ROM       Stability: No instability detected Muscle Tone/Strength: Functionally intact. No obvious neuro-muscular anomalies detected. Sensory (Neurological): Unimpaired Palpation: No palpable anomalies       Provocative Tests: Hyperextension/rotation test: deferred today       Lumbar quadrant test (Kemp's test): deferred today       Lateral bending test: deferred today       Patrick's Maneuver: deferred today                   FABER* test: deferred today                   S-I anterior distraction/compression test: deferred today         S-I lateral compression test: deferred today  S-I Thigh-thrust test: deferred today         S-I Gaenslen's test: deferred today         *(Flexion, ABduction and External Rotation)  Gait & Posture Assessment  Ambulation: Unassisted Gait: Relatively normal for age and body habitus Posture: WNL   Lower Extremity Exam    Side: Right lower extremity  Side: Left lower extremity  Stability: No instability observed          Stability: No instability observed          Skin & Extremity Inspection: Skin  color, temperature, and hair growth are WNL. No peripheral edema or cyanosis. No masses, redness, swelling, asymmetry, or associated skin lesions. No contractures.  Skin & Extremity Inspection: Skin color, temperature, and hair growth are WNL. No peripheral edema or cyanosis. No masses, redness, swelling, asymmetry, or associated skin lesions. No contractures.  Functional ROM: Unrestricted ROM                  Functional ROM: Unrestricted ROM                  Muscle Tone/Strength: Functionally intact. No obvious neuro-muscular anomalies detected.  Muscle Tone/Strength: Functionally intact. No obvious neuro-muscular anomalies detected.  Sensory (Neurological): Unimpaired        Sensory (Neurological): Unimpaired        DTR: Patellar: deferred today Achilles: deferred today Plantar: deferred today  DTR: Patellar: deferred today Achilles: deferred today Plantar: deferred today  Palpation: No palpable anomalies  Palpation: No palpable anomalies   Assessment  Primary Diagnosis & Pertinent Problem List: The primary encounter diagnosis was Chronic low back pain (Primary Area of Pain) (Bilateral) (L>R) w/ sciatica (Bilateral). Diagnoses of Chronic lower extremity pain (Secondary Area of Pain) (Bilateral) (L>R), Chronic lumbar radiculopathy (by EMG/PNCV) (L5) (Right), Chronic knee pain (Tertiary Area of Pain) (Bilateral) (L>R), Chemotherapy-induced peripheral neuropathy (HCC), Chronic pain syndrome, Multiple myeloma, remission status unspecified (HCC), Spasm of back muscles, Chronic musculoskeletal pain, Neuropathic pain, Neurogenic pain, and Chronic hip pain (Bilateral) (R>L) were also pertinent to this visit.  Status Diagnosis  Controlled Controlled Controlled 1. Chronic low back pain (Primary Area of Pain) (Bilateral) (L>R) w/ sciatica (Bilateral)   2. Chronic lower extremity pain (Secondary Area of Pain) (Bilateral) (L>R)   3. Chronic lumbar radiculopathy (by EMG/PNCV) (L5) (Right)   4. Chronic  knee pain (Tertiary Area of Pain) (Bilateral) (L>R)   5. Chemotherapy-induced peripheral neuropathy ([place])   6. Chronic pain syndrome   7. Multiple myeloma, remission status unspecified (HCC)   8. Spasm of back muscles   9. Chronic musculoskeletal pain   10. Neuropathic pain   11. Neurogenic pain   12. Chronic hip pain (Bilateral) (R>L)     Problems updated and reviewed during this visit: No problems updated. Plan of Care  Pharmacotherapy (Medications Ordered): Meds ordered this encounter  Medications  . baclofen (LIORESAL) 10 MG tablet    Sig: Take 1 tablet (10 mg total) by mouth 4 (four) times daily.    Dispense:  120 tablet    Refill:  2    Do not place this medication, or any other prescription from our practice, on "Automatic Refill". Patient may have prescription filled one day early if pharmacy is closed on scheduled refill date.  Marland Kitchen oxyCODONE (OXY IR/ROXICODONE) 5 MG immediate release tablet    Sig: Take 0.5 tablets (2.5 mg total) by mouth 2 (two) times daily for 30 days. Must last 30 days.  Dispense:  30 tablet    Refill:  0    [place] STOP ACT - Not applicable. Fill one day early if pharmacy is closed on scheduled refill date. Do not fill until: 05/04/18. Must last 30 days. To last until: 06/03/18.  Marland Kitchen gabapentin (NEURONTIN) 600 MG tablet    Sig: Take 1 tablet (600 mg total) by mouth 3 (three) times daily.    Dispense:  90 tablet    Refill:  2    Do not place medication on "Automatic Refill". Fill one day early if pharmacy is closed on scheduled refill date.  . pregabalin (LYRICA) 150 MG capsule    Sig: Take 1 capsule (150 mg total) by mouth 3 (three) times daily.    Dispense:  90 capsule    Refill:  2    Do not place medication on "Automatic Refill". Fill one day early if pharmacy is closed on scheduled refill date.  . OxyCODONE HCl, Abuse Deter, (OXAYDO) 5 MG TABA    Sig: Take 2.5 mg by mouth 2 (two) times daily as needed for up to 30 days. Must last 30 days.    Dispense:   30 tablet    Refill:  0    [place] STOP ACT - Not applicable. Fill one day early if pharmacy is closed on scheduled refill date. Do not fill until: 06/03/18. Must last 30 days. To last until: 07/03/18.  . OxyCODONE HCl, Abuse Deter, (OXAYDO) 5 MG TABA    Sig: Take 2.5 mg by mouth 2 (two) times daily as needed for up to 30 days. Must last 30 days.    Dispense:  30 tablet    Refill:  0     STOP ACT - Not applicable. Fill one day early if pharmacy is closed on scheduled refill date. Do not fill until: 07/03/18. Must last 30 days. To last until: 08/02/18.   Medications administered today: [name] had no medications administered during this visit.   Procedure Orders     HIP INJECTION Lab Orders  No laboratory test(s) ordered today   Imaging Orders  No imaging studies ordered today   Referral Orders  No referral(s) requested today   Interventional management options: Planned, scheduled, and/or pending:   Diagnostic bilateral hip joint injection #1, under fluoro and IV sedation    Considering:   DiagnosticbilateralLESI Diagnosticbilateral lumbar facet block Possible bilateral lumbar facet RFA  Diagnostic bilateral sacroiliac joint block  Possible bilateral sacroiliac joint RFA  Diagnostic right knee Hyalgan series Diagnosticright knee intra-articular knee injection Diagnostic left knee genicular nerve block   Palliative PRN treatment(s):   None at this time   Provider-requested follow-up: Return for Procedure (w/ sedation): (B) IA Hip inj. #1.  Future Appointments  Date Time Provider [place]  05/14/2018  9:30 AM Milinda Pointer, MD [facility] None  05/25/2018 10:00 AM Abbie Sons, MD [facility] None  06/12/2018  9:30 AM [facility] None  06/12/2018 10:00 AM Sindy Guadeloupe, MD [facility] None  06/12/2018 10:30 AM [facility] INJECTION [facility] None  07/28/2018  9:00 AM Vevelyn Francois, NP [facility] None   Primary Care Physician: Pleas Koch, NP Location: [facility] Out[facility] Management Facility Note by: Gaspar Cola, MD Date: 05/04/2018; Time: 10:05 AM

## 2018-05-04 ENCOUNTER — Ambulatory Visit: Payer: No Typology Code available for payment source | Attending: Pain Medicine | Admitting: Pain Medicine

## 2018-05-04 ENCOUNTER — Other Ambulatory Visit: Payer: Self-pay

## 2018-05-04 ENCOUNTER — Encounter: Payer: Self-pay | Admitting: Pain Medicine

## 2018-05-04 VITALS — BP 129/94 | HR 82 | Temp 98.4°F | Resp 18 | Ht 67.0 in | Wt 150.0 lb

## 2018-05-04 DIAGNOSIS — M25562 Pain in left knee: Secondary | ICD-10-CM | POA: Diagnosis present

## 2018-05-04 DIAGNOSIS — M25551 Pain in right hip: Secondary | ICD-10-CM | POA: Insufficient documentation

## 2018-05-04 DIAGNOSIS — G894 Chronic pain syndrome: Secondary | ICD-10-CM

## 2018-05-04 DIAGNOSIS — M25552 Pain in left hip: Secondary | ICD-10-CM | POA: Diagnosis present

## 2018-05-04 DIAGNOSIS — M25561 Pain in right knee: Secondary | ICD-10-CM | POA: Insufficient documentation

## 2018-05-04 DIAGNOSIS — G620 Drug-induced polyneuropathy: Secondary | ICD-10-CM | POA: Diagnosis present

## 2018-05-04 DIAGNOSIS — T451X5A Adverse effect of antineoplastic and immunosuppressive drugs, initial encounter: Secondary | ICD-10-CM | POA: Diagnosis present

## 2018-05-04 DIAGNOSIS — C9000 Multiple myeloma not having achieved remission: Secondary | ICD-10-CM | POA: Diagnosis present

## 2018-05-04 DIAGNOSIS — M79604 Pain in right leg: Secondary | ICD-10-CM | POA: Diagnosis present

## 2018-05-04 DIAGNOSIS — M5442 Lumbago with sciatica, left side: Secondary | ICD-10-CM | POA: Insufficient documentation

## 2018-05-04 DIAGNOSIS — M5416 Radiculopathy, lumbar region: Secondary | ICD-10-CM | POA: Diagnosis not present

## 2018-05-04 DIAGNOSIS — M792 Neuralgia and neuritis, unspecified: Secondary | ICD-10-CM

## 2018-05-04 DIAGNOSIS — G8929 Other chronic pain: Secondary | ICD-10-CM | POA: Diagnosis present

## 2018-05-04 DIAGNOSIS — M7918 Myalgia, other site: Secondary | ICD-10-CM | POA: Diagnosis present

## 2018-05-04 DIAGNOSIS — M79605 Pain in left leg: Secondary | ICD-10-CM | POA: Diagnosis present

## 2018-05-04 DIAGNOSIS — M5441 Lumbago with sciatica, right side: Secondary | ICD-10-CM | POA: Insufficient documentation

## 2018-05-04 DIAGNOSIS — M62830 Muscle spasm of back: Secondary | ICD-10-CM | POA: Diagnosis present

## 2018-05-04 MED ORDER — OXYCODONE HCL 5 MG PO TABA: 2.5000 mg | ORAL_TABLET | Freq: Two times a day (BID) | ORAL | 0 refills | Status: DC | PRN

## 2018-05-04 MED ORDER — PREGABALIN 150 MG PO CAPS: 150.0000 mg | ORAL_CAPSULE | Freq: Three times a day (TID) | ORAL | 2 refills | Status: DC

## 2018-05-04 MED ORDER — GABAPENTIN 600 MG PO TABS: 600.0000 mg | ORAL_TABLET | Freq: Three times a day (TID) | ORAL | 2 refills | Status: DC

## 2018-05-04 MED ORDER — BACLOFEN 10 MG PO TABS: 10.0000 mg | ORAL_TABLET | Freq: Four times a day (QID) | ORAL | 2 refills | Status: DC

## 2018-05-04 MED ORDER — OXYCODONE HCL 5 MG PO TABS: 2.5000 mg | ORAL_TABLET | Freq: Two times a day (BID) | ORAL | 0 refills | Status: DC

## 2018-05-04 NOTE — Progress Notes (Signed)
Nursing Pain Medication Assessment:  Safety precautions to be maintained throughout the outpatient stay will include: orient to surroundings, keep bed in low position, maintain call bell within reach at all times, provide assistance with transfer out of bed and ambulation.  Medication Inspection Compliance: Pill count conducted under aseptic conditions, in front of the patient. Neither the pills nor the bottle was removed from the patient's sight at any time. Once count was completed pills were immediately returned to the patient in their original bottle.  Medication: Oxycodone HCL 5MG  Pill/Patch Count: 3 of 30 pills remain Pill/Patch Appearance: Markings consistent with prescribed medication Bottle Appearance: Standard pharmacy container. Clearly labeled. Filled Date: 58 / 16 / 2019 Last Medication intake:  Today

## 2018-05-04 NOTE — Patient Instructions (Addendum)
______________________________________________________________________________________________  Specialty Pain Scale  Introduction:  There are significant differences in how pain is reported. The word pain usually refers to physical pain, but it is also a common synonym of suffering. The medical community uses a scale from 0 (zero) to 10 (ten) to report pain level. Zero (0) is described as "no pain", while ten (10) is described as "the worse pain you can imagine". The problem with this scale is that physical pain is reported along with suffering. Suffering refers to mental pain, or more often yet it refers to any unpleasant feeling, emotion or aversion associated with the perception of harm or threat of harm. It is the psychological component of pain.  Pain Specialists prefer to separate the two components. The pain scale used by this practice is the Verbal Numerical Rating Scale (VNRS-11). This scale is for the physical pain only. DO NOT INCLUDE how your pain psychologically affects you. This scale is for adults 21 years of age and older. It has 11 (eleven) levels. The 1st level is 0/10. This means: "right now, I have no pain". In the context of pain management, it also means: "right now, my physical pain is under control with the current therapy".  General Information:  The scale should reflect your current level of pain. Unless you are specifically asked for the level of your worst pain, or your average pain. If you are asked for one of these two, then it should be understood that it is over the past 24 hours.  Levels 1 (one) through 5 (five) are described below, and can be treated as an outpatient. Ambulatory pain management facilities such as ours are more than adequate to treat these levels. Levels 6 (six) through 10 (ten) are also described below, however, these must be treated as a hospitalized patient. While levels 6 (six) and 7 (seven) may be evaluated at an urgent care facility, levels 8  (eight) through 10 (ten) constitute medical emergencies and as such, they belong in a hospital's emergency department. When having these levels (as described below), do not come to our office. Our facility is not equipped to manage these levels. Go directly to an urgent care facility or an emergency department to be evaluated.  Definitions:  Activities of Daily Living (ADL): Activities of daily living (ADL or ADLs) is a term used in healthcare to refer to people's daily self-care activities. Health professionals often use a person's ability or inability to perform ADLs as a measurement of their functional status, particularly in regard to people post injury, with disabilities and the elderly. There are two ADL levels: Basic and Instrumental. Basic Activities of Daily Living (BADL  or BADLs) consist of self-care tasks that include: Bathing and showering; personal hygiene and grooming (including brushing/combing/styling hair); dressing; Toilet hygiene (getting to the toilet, cleaning oneself, and getting back up); eating and self-feeding (not including cooking or chewing and swallowing); functional mobility, often referred to as "transferring", as measured by the ability to walk, get in and out of bed, and get into and out of a chair; the broader definition (moving from one place to another while performing activities) is useful for people with different physical abilities who are still able to get around independently. Basic ADLs include the things many people do when they get up in the morning and get ready to go out of the house: get out of bed, go to the toilet, bathe, dress, groom, and eat. On the average, loss of function typically follows a particular order.   Hygiene is the first to go, followed by loss of toilet use and locomotion. The last to go is the ability to eat. When there is only one remaining area in which the person is independent, there is a 62.9% chance that it is eating and only a 3.5% chance  that it is hygiene. Instrumental Activities of Daily Living (IADL or IADLs) are not necessary for fundamental functioning, but they let an individual live independently in a community. IADL consist of tasks that include: cleaning and maintaining the house; home establishment and maintenance; care of others (including selecting and supervising caregivers); care of pets; child rearing; managing money; managing financials (investments, etc.); meal preparation and cleanup; shopping for groceries and necessities; moving within the community; safety procedures and emergency responses; health management and maintenance (taking prescribed medications); and using the telephone or other form of communication.  Instructions:  Most patients tend to report their pain as a combination of two factors, their physical pain and their psychosocial pain. This last one is also known as "suffering" and it is reflection of how physical pain affects you socially and psychologically. From now on, report them separately.  From this point on, when asked to report your pain level, report only your physical pain. Use the following table for reference.  Pain Clinic Pain Levels (0-5/10)  Pain Level Score  Description  No Pain 0   Mild pain 1 Nagging, annoying, but does not interfere with basic activities of daily living (ADL). Patients are able to eat, bathe, get dressed, toileting (being able to get on and off the toilet and perform personal hygiene functions), transfer (move in and out of bed or a chair without assistance), and maintain continence (able to control bladder and bowel functions). Blood pressure and heart rate are unaffected. A normal heart rate for a healthy adult ranges from 60 to 100 bpm (beats per minute).   Mild to moderate pain 2 Noticeable and distracting. Impossible to hide from other people. More frequent flare-ups. Still possible to adapt and function close to normal. It can be very annoying and may have  occasional stronger flare-ups. With discipline, patients may get used to it and adapt.   Moderate pain 3 Interferes significantly with activities of daily living (ADL). It becomes difficult to feed, bathe, get dressed, get on and off the toilet or to perform personal hygiene functions. Difficult to get in and out of bed or a chair without assistance. Very distracting. With effort, it can be ignored when deeply involved in activities.   Moderately severe pain 4 Impossible to ignore for more than a few minutes. With effort, patients may still be able to manage work or participate in some social activities. Very difficult to concentrate. Signs of autonomic nervous system discharge are evident: dilated pupils (mydriasis); mild sweating (diaphoresis); sleep interference. Heart rate becomes elevated (>115 bpm). Diastolic blood pressure (lower number) rises above 100 mmHg. Patients find relief in laying down and not moving.   Severe pain 5 Intense and extremely unpleasant. Associated with frowning face and frequent crying. Pain overwhelms the senses.  Ability to do any activity or maintain social relationships becomes significantly limited. Conversation becomes difficult. Pacing back and forth is common, as getting into a comfortable position is nearly impossible. Pain wakes you up from deep sleep. Physical signs will be obvious: pupillary dilation; increased sweating; goosebumps; brisk reflexes; cold, clammy hands and feet; nausea, vomiting or dry heaves; loss of appetite; significant sleep disturbance with inability to fall asleep or to   remain asleep. When persistent, significant weight loss is observed due to the complete loss of appetite and sleep deprivation.  Blood pressure and heart rate becomes significantly elevated. Caution: If elevated blood pressure triggers a pounding headache associated with blurred vision, then the patient should immediately seek attention at an urgent or emergency care unit, as  these may be signs of an impending stroke.    Emergency Department Pain Levels (6-10/10)  Emergency Room Pain 6 Severely limiting. Requires emergency care and should not be seen or managed at an outpatient pain management facility. Communication becomes difficult and requires great effort. Assistance to reach the emergency department may be required. Facial flushing and profuse sweating along with potentially dangerous increases in heart rate and blood pressure will be evident.   Distressing pain 7 Self-care is very difficult. Assistance is required to transport, or use restroom. Assistance to reach the emergency department will be required. Tasks requiring coordination, such as bathing and getting dressed become very difficult.   Disabling pain 8 Self-care is no longer possible. At this level, pain is disabling. The individual is unable to do even the most "basic" activities such as walking, eating, bathing, dressing, transferring to a bed, or toileting. Fine motor skills are lost. It is difficult to think clearly.   Incapacitating pain 9 Pain becomes incapacitating. Thought processing is no longer possible. Difficult to remember your own name. Control of movement and coordination are lost.   The worst pain imaginable 10 At this level, most patients pass out from pain. When this level is reached, collapse of the autonomic nervous system occurs, leading to a sudden drop in blood pressure and heart rate. This in turn results in a temporary and dramatic drop in blood flow to the brain, leading to a loss of consciousness. Fainting is one of the body's self defense mechanisms. Passing out puts the brain in a calmed state and causes it to shut down for a while, in order to begin the healing process.    Summary: 1. Refer to this scale when providing [place] with your pain level. 2. Be accurate and careful when reporting your pain level. This will help with your care. 3. Over-reporting your pain level will  lead to loss of credibility. 4. Even a level of 1/10 means that there is pain and will be treated at our facility. 5. High, inaccurate reporting will be documented as "Symptom Exaggeration", leading to loss of credibility and suspicions of possible secondary gains such as obtaining more narcotics, or wanting to appear disabled, for fraudulent reasons. 6. Only pain levels of 5 or below will be seen at our facility. 7. Pain levels of 6 and above will be sent to the Emergency Department and the appointment cancelled. ______________________________________________________________________________________________   ____________________________________________________________________________________________  Medication Rules  Purpose: To inform patients, and their family members, of our rules and regulations.  Applies to: All patients receiving prescriptions (written or electronic).  Pharmacy of record: Pharmacy where electronic prescriptions will be sent. If written prescriptions are taken to a different pharmacy, please inform the nursing staff. The pharmacy listed in the electronic medical record should be the one where you would like electronic prescriptions to be sent.  Electronic prescriptions: In compliance with the [place] (STOP) Act of 2017 (Session Lanny Cramp [phone]), effective April 22, 2018, all controlled substances must be electronically prescribed. Calling prescriptions to the pharmacy will cease to exist.  Prescription refills: Only during scheduled appointments. Applies to all prescriptions.  NOTE: The  following applies primarily to controlled substances (Opioid* Pain Medications).   Patient's responsibilities: 1. Pain Pills: Bring all pain pills to every appointment (except for procedure appointments). 2. Pill Bottles: Bring pills in original pharmacy bottle. Always bring the newest bottle. Bring bottle, even if empty. 3. Medication  refills: You are responsible for knowing and keeping track of what medications you take and those you need refilled. The day before your appointment: write a list of all prescriptions that need to be refilled. The day of the appointment: give the list to the admitting nurse. Prescriptions will be written only during appointments. If you forget a medication: it will not be "Called in", "Faxed", or "electronically sent". You will need to get another appointment to get these prescribed. No early refills. Do not call asking to have your prescription filled early. 4. Prescription Accuracy: You are responsible for carefully inspecting your prescriptions before leaving our office. Have the discharge nurse carefully go over each prescription with you, before taking them home. Make sure that your name is accurately spelled, that your address is correct. Check the name and dose of your medication to make sure it is accurate. Check the number of pills, and the written instructions to make sure they are clear and accurate. Make sure that you are given enough medication to last until your next medication refill appointment. 5. Taking Medication: Take medication as prescribed. When it comes to controlled substances, taking less pills or less frequently than prescribed is permitted and encouraged. Never take more pills than instructed. Never take medication more frequently than prescribed.  6. Inform other Doctors: Always inform, all of your healthcare providers, of all the medications you take. 7. Pain Medication from other Providers: You are not allowed to accept any additional pain medication from any other Doctor or Healthcare provider. There are two exceptions to this rule. (see below) In the event that you require additional pain medication, you are responsible for notifying us, as stated below. 8. Medication Agreement: You are responsible for carefully reading and following our Medication Agreement. This must be  signed before receiving any prescriptions from our practice. Safely store a copy of your signed Agreement. Violations to the Agreement will result in no further prescriptions. (Additional copies of our Medication Agreement are available upon request.) 9. Laws, Rules, & Regulations: All patients are expected to follow all Federal and [employer], [employer], Rules, [employer]. Ignorance of the Laws does not constitute a valid excuse. The use of any illegal substances is prohibited. 10. Adopted CDC guidelines & recommendations: Target dosing levels will be at or below 60 MME/day. Use of benzodiazepines** is not recommended.  Exceptions: There are only two exceptions to the rule of not receiving pain medications from other Healthcare Providers. 1. Exception #1 (Emergencies): In the event of an emergency (i.e.: accident requiring emergency care), you are allowed to receive additional pain medication. However, you are responsible for: As soon as you are able, call our office [phone], at any time of the day or night, and leave a message stating your name, the date and nature of the emergency, and the name and dose of the medication prescribed. In the event that your call is answered by a member of our staff, make sure to document and save the date, time, and the name of the person that took your information.  2. Exception #2 (Planned Surgery): In the event that you are scheduled by another doctor or dentist to have any type of surgery or procedure,  you are allowed (for a period no longer than 30 days), to receive additional pain medication, for the acute post-op pain. However, in this case, you are responsible for picking up a copy of our "Post-op Pain Management for Surgeons" handout, and giving it to your surgeon or dentist. This document is available at our office, and does not require an appointment to obtain it. Simply go to our office during business hours (Monday-Thursday from 8:00 AM to 4:00 PM)  (Friday 8:00 AM to 12:00 Noon) or if you have a scheduled appointment with [place], prior to your surgery, and ask for it by name. In addition, you will need to provide [place] with your name, name of your surgeon, type of surgery, and date of procedure or surgery.  *Opioid medications include: morphine, codeine, oxycodone, oxymorphone, hydrocodone, hydromorphone, meperidine, tramadol, tapentadol, buprenorphine, fentanyl, methadone. **Benzodiazepine medications include: diazepam (Valium), alprazolam (Xanax), clonazepam (Klonopine), lorazepam (Ativan), clorazepate (Tranxene), chlordiazepoxide (Librium), estazolam (Prosom), oxazepam (Serax), temazepam (Restoril), triazolam (Halcion) (Last updated: 06/19/2017) ____________________________________________________________________________________________   ____________________________________________________________________________________________  Medication Recommendations and Reminders  Applies to: All patients receiving prescriptions (written and/or electronic).  Medication Rules & Regulations: These rules and regulations exist for your safety and that of others. They are not flexible and neither are we. Dismissing or ignoring them will be considered "non-compliance" with medication therapy, resulting in complete and irreversible termination of such therapy. (See document titled "Medication Rules" for more details.) In all conscience, because of safety reasons, we cannot continue providing a therapy where the patient does not follow instructions.  Pharmacy of record:   Definition: This is the pharmacy where your electronic prescriptions will be sent.   We do not endorse any particular pharmacy.  You are not restricted in your choice of pharmacy.  The pharmacy listed in the electronic medical record should be the one where you want electronic prescriptions to be sent.  If you choose to change pharmacy, simply notify our nursing staff of your choice of  new pharmacy.  Recommendations:  Keep all of your pain medications in a safe place, under lock and key, even if you live alone.   After you fill your prescription, take 1 week's worth of pills and put them away in a safe place. You should keep a separate, properly labeled bottle for this purpose. The remainder should be kept in the original bottle. Use this as your primary supply, until it runs out. Once it's gone, then you know that you have 1 week's worth of medicine, and it is time to come in for a prescription refill. If you do this correctly, it is unlikely that you will ever run out of medicine.  To make sure that the above recommendation works, it is very important that you make sure your medication refill appointments are scheduled at least 1 week before you run out of medicine. To do this in an effective manner, make sure that you do not leave the office without scheduling your next medication management appointment. Always ask the nursing staff to show you in your prescription , when your medication will be running out. Then arrange for the receptionist to get you a return appointment, at least 7 days before you run out of medicine. Do not wait until you have 1 or 2 pills left, to come in. This is very poor planning and does not take into consideration that we may need to cancel appointments due to bad weather, sickness, or emergencies affecting our staff.  "Partial Fill": If for any reason  your pharmacy does not have enough pills/tablets to completely fill or refill your prescription, do not allow for a "partial fill". You will need a separate prescription to fill the remaining amount, which we will not provide. If the reason for the partial fill is your insurance, you will need to talk to the pharmacist about payment alternatives for the remaining tablets, but again, do not accept a partial fill.  Prescription refills and/or changes in medication(s):   Prescription refills, and/or changes  in dose or medication, will be conducted only during scheduled medication management appointments. (Applies to both, written and electronic prescriptions.)  No refills on procedure days. No medication will be changed or started on procedure days. No changes, adjustments, and/or refills will be conducted on a procedure day. Doing so will interfere with the diagnostic portion of the procedure.  No phone refills. No medications will be "called into the pharmacy".  No Fax refills.  No weekend refills.  No Holliday refills.  No after hours refills.  Remember:  Business hours are:  Monday to Thursday 8:00 AM to 4:00 PM Provider's Schedule: Dionisio David, NP - Appointments are:  Medication management: Monday to Thursday 8:00 AM to 4:00 PM Milinda Pointer, MD - Appointments are:  Medication management: Monday and Wednesday 8:00 AM to 4:00 PM Procedure day: Tuesday and Thursday 7:30 AM to 4:00 PM Gillis Santa, MD - Appointments are:  Medication management: Tuesday and Thursday 8:00 AM to 4:00 PM Procedure day: Monday and Wednesday 7:30 AM to 4:00 PM (Last update: 06/19/2017) ____________________________________________________________________________________________   ____________________________________________________________________________________________  CANNABIDIOL (AKA: CBD Oil or Pills)  Applies to: All patients receiving prescriptions of controlled substances (written and/or electronic).  General Information: Cannabidiol (CBD) was discovered in 21. It is one of some 113 identified cannabinoids in cannabis (Marijuana) plants, accounting for up to 40% of the plant's extract. As of 2018, preliminary clinical research on cannabidiol included studies of anxiety, cognition, movement disorders, and pain.  Cannabidiol is consummed in multiple ways, including inhalation of cannabis smoke or vapor, as an aerosol spray into the cheek, and by mouth. It may be supplied as CBD oil  containing CBD as the active ingredient (no added tetrahydrocannabinol (THC) or terpenes), a full-plant CBD-dominant hemp extract oil, capsules, dried cannabis, or as a liquid solution. CBD is thought not have the same psychoactivity as THC, and may affect the actions of THC. Studies suggest that CBD may interact with different biological targets, including cannabinoid receptors and other neurotransmitter receptors. As of 2018 the mechanism of action for its biological effects has not been determined.  In the [place], cannabidiol has a limited approval by the Food and Drug Administration (FDA) for treatment of only two types of epilepsy disorders. The side effects of long-term use of the drug include somnolence, decreased appetite, diarrhea, fatigue, malaise, weakness, sleeping problems, and others.  CBD remains a Schedule I drug prohibited for any use.  Legality: Some manufacturers ship CBD products nationally, an illegal action which the FDA has not enforced in 2018, with CBD remaining the subject of an FDA investigational new drug evaluation, and is not considered legal as a dietary supplement or food ingredient as of December 2018. Federal illegality has made it difficult historically to conduct research on CBD. CBD is openly sold in head shops and health food stores in some states where such sales have not been explicitly legalized.  Warning: Because it is not FDA approved for general use or treatment of pain, it is not required to  undergo the same manufacturing controls as prescription drugs.  This means that the available cannabidiol (CBD) may be contaminated with THC.  If this is the case, it will trigger a positive urine drug screen (UDS) test for cannabinoids (Marijuana).  Because a positive UDS for illicit substances is a violation of our medication agreement, your opioid analgesics (pain medicine) may be permanently discontinued. (Last update:  07/10/2017) ____________________________________________________________________________________________   ____________________________________________________________________________________________  Preparing for Procedure with Sedation  Instructions: . Oral Intake: Do not eat or drink anything for at least 8 hours prior to your procedure. . Transportation: Public transportation is not allowed. Bring an adult driver. The driver must be physically present in our waiting room before any procedure can be started. Marland Kitchen Physical Assistance: Bring an adult physically capable of assisting you, in the event you need help. This adult should keep you company at home for at least 6 hours after the procedure. . Blood Pressure Medicine: Take your blood pressure medicine with a sip of water the morning of the procedure. . Blood thinners: Notify our staff if you are taking any blood thinners. Depending on which one you take, there will be specific instructions on how and when to stop it. . Diabetics on insulin: Notify the staff so that you can be scheduled 1st case in the morning. If your diabetes requires high dose insulin, take only  of your normal insulin dose the morning of the procedure and notify the staff that you have done so. . Preventing infections: Shower with an antibacterial soap the morning of your procedure. . Build-up your immune system: Take 1000 mg of Vitamin C with every meal (3 times a day) the day prior to your procedure. Marland Kitchen Antibiotics: Inform the staff if you have a condition or reason that requires you to take antibiotics before dental procedures. . Pregnancy: If you are pregnant, call and cancel the procedure. . Sickness: If you have a cold, fever, or any active infections, call and cancel the procedure. . Arrival: You must be in the facility at least 30 minutes prior to your scheduled procedure. . Children: Do not bring children with you. . Dress appropriately: Bring dark clothing  that you would not mind if they get stained. . Valuables: Do not bring any jewelry or valuables.  Procedure appointments are reserved for interventional treatments only. Marland Kitchen No Prescription Refills. . No medication changes will be discussed during procedure appointments. . No disability issues will be discussed.  Reasons to call and reschedule or cancel your procedure: (Following these recommendations will minimize the risk of a serious complication.) . Surgeries: Avoid having procedures within 2 weeks of any surgery. (Avoid for 2 weeks before or after any surgery). . Flu Shots: Avoid having procedures within 2 weeks of a flu shots or . (Avoid for 2 weeks before or after immunizations). . Barium: Avoid having a procedure within 7-10 days after having had a radiological study involving the use of radiological contrast. (Myelograms, Barium swallow or enema study). . Heart attacks: Avoid any elective procedures or surgeries for the initial 6 months after a "Myocardial Infarction" (Heart Attack). . Blood thinners: It is imperative that you stop these medications before procedures. Let us know if you if you take any blood thinner.  . Infection: Avoid procedures during or within two weeks of an infection (including chest colds or gastrointestinal problems). Symptoms associated with infections include: Localized redness, fever, chills, night sweats or profuse sweating, burning sensation when voiding, cough, congestion, stuffiness, runny nose, sore throat,  diarrhea, nausea, vomiting, cold or Flu symptoms, recent or current infections. It is specially important if the infection is over the area that we intend to treat. Marland Kitchen Heart and lung problems: Symptoms that may suggest an active cardiopulmonary problem include: cough, chest pain, breathing difficulties or shortness of breath, dizziness, ankle swelling, uncontrolled high or unusually low blood pressure, and/or palpitations. If you are experiencing any of these  symptoms, cancel your procedure and contact your primary care physician for an evaluation.  Remember:  Regular Business hours are:  Monday to Thursday 8:00 AM to 4:00 PM  Provider's Schedule: Milinda Pointer, MD:  Procedure days: Tuesday and Thursday 7:30 AM to 4:00 PM  Gillis Santa, MD:  Procedure days: Monday and Wednesday 7:30 AM to 4:00 PM ____________________________________________________________________________________________   ____________________________________________________________________________________________  Preparing for your procedure (without sedation)  Instructions: . Oral Intake: Do not eat or drink anything for at least 3 hours prior to your procedure. . Transportation: Unless otherwise stated by your physician, you may drive yourself after the procedure. . Blood Pressure Medicine: Take your blood pressure medicine with a sip of water the morning of the procedure. . Blood thinners: Notify our staff if you are taking any blood thinners. Depending on which one you take, there will be specific instructions on how and when to stop it. . Diabetics on insulin: Notify the staff so that you can be scheduled 1st case in the morning. If your diabetes requires high dose insulin, take only  of your normal insulin dose the morning of the procedure and notify the staff that you have done so. . Preventing infections: Shower with an antibacterial soap the morning of your procedure.  . Build-up your immune system: Take 1000 mg of Vitamin C with every meal (3 times a day) the day prior to your procedure. Marland Kitchen Antibiotics: Inform the staff if you have a condition or reason that requires you to take antibiotics before dental procedures. . Pregnancy: If you are pregnant, call and cancel the procedure. . Sickness: If you have a cold, fever, or any active infections, call and cancel the procedure. . Arrival: You must be in the facility at least 30 minutes prior to your scheduled  procedure. . Children: Do not bring any children with you. . Dress appropriately: Bring dark clothing that you would not mind if they get stained. . Valuables: Do not bring any jewelry or valuables.  Procedure appointments are reserved for interventional treatments only. Marland Kitchen No Prescription Refills. . No medication changes will be discussed during procedure appointments. . No disability issues will be discussed.  Reasons to call and reschedule or cancel your procedure: (Following these recommendations will minimize the risk of a serious complication.) . Surgeries: Avoid having procedures within 2 weeks of any surgery. (Avoid for 2 weeks before or after any surgery). . Flu Shots: Avoid having procedures within 2 weeks of a flu shots or . (Avoid for 2 weeks before or after immunizations). . Barium: Avoid having a procedure within 7-10 days after having had a radiological study involving the use of radiological contrast. (Myelograms, Barium swallow or enema study). . Heart attacks: Avoid any elective procedures or surgeries for the initial 6 months after a "Myocardial Infarction" (Heart Attack). . Blood thinners: It is imperative that you stop these medications before procedures. Let us know if you if you take any blood thinner.  . Infection: Avoid procedures during or within two weeks of an infection (including chest colds or gastrointestinal problems). Symptoms associated with infections  include: Localized redness, fever, chills, night sweats or profuse sweating, burning sensation when voiding, cough, congestion, stuffiness, runny nose, sore throat, diarrhea, nausea, vomiting, cold or Flu symptoms, recent or current infections. It is specially important if the infection is over the area that we intend to treat. Marland Kitchen Heart and lung problems: Symptoms that may suggest an active cardiopulmonary problem include: cough, chest pain, breathing difficulties or shortness of breath, dizziness, ankle swelling,  uncontrolled high or unusually low blood pressure, and/or palpitations. If you are experiencing any of these symptoms, cancel your procedure and contact your primary care physician for an evaluation.  Remember:  Regular Business hours are:  Monday to Thursday 8:00 AM to 4:00 PM  Provider's Schedule: Milinda Pointer, MD:  Procedure days: Tuesday and Thursday 7:30 AM to 4:00 PM  Gillis Santa, MD:  Procedure days: Monday and Wednesday 7:30 AM to 4:00 PM ____________________________________________________________________________________________  ____________________________________________________________________________________________  Preparing for your procedure (without sedation)  Instructions: . Oral Intake: Do not eat or drink anything for at least 3 hours prior to your procedure. . Transportation: Unless otherwise stated by your physician, you may drive yourself after the procedure. . Blood Pressure Medicine: Take your blood pressure medicine with a sip of water the morning of the procedure. . Blood thinners: Notify our staff if you are taking any blood thinners. Depending on which one you take, there will be specific instructions on how and when to stop it. . Diabetics on insulin: Notify the staff so that you can be scheduled 1st case in the morning. If your diabetes requires high dose insulin, take only  of your normal insulin dose the morning of the procedure and notify the staff that you have done so. . Preventing infections: Shower with an antibacterial soap the morning of your procedure.  . Build-up your immune system: Take 1000 mg of Vitamin C with every meal (3 times a day) the day prior to your procedure. Marland Kitchen Antibiotics: Inform the staff if you have a condition or reason that requires you to take antibiotics before dental procedures. . Pregnancy: If you are pregnant, call and cancel the procedure. . Sickness: If you have a cold, fever, or any active infections, call and  cancel the procedure. . Arrival: You must be in the facility at least 30 minutes prior to your scheduled procedure. . Children: Do not bring any children with you. . Dress appropriately: Bring dark clothing that you would not mind if they get stained. . Valuables: Do not bring any jewelry or valuables.  Procedure appointments are reserved for interventional treatments only. Marland Kitchen No Prescription Refills. . No medication changes will be discussed during procedure appointments. . No disability issues will be discussed.  Reasons to call and reschedule or cancel your procedure: (Following these recommendations will minimize the risk of a serious complication.) . Surgeries: Avoid having procedures within 2 weeks of any surgery. (Avoid for 2 weeks before or after any surgery). . Flu Shots: Avoid having procedures within 2 weeks of a flu shots or . (Avoid for 2 weeks before or after immunizations). . Barium: Avoid having a procedure within 7-10 days after having had a radiological study involving the use of radiological contrast. (Myelograms, Barium swallow or enema study). . Heart attacks: Avoid any elective procedures or surgeries for the initial 6 months after a "Myocardial Infarction" (Heart Attack). . Blood thinners: It is imperative that you stop these medications before procedures. Let us know if you if you take any blood thinner.  . Infection:  Avoid procedures during or within two weeks of an infection (including chest colds or gastrointestinal problems). Symptoms associated with infections include: Localized redness, fever, chills, night sweats or profuse sweating, burning sensation when voiding, cough, congestion, stuffiness, runny nose, sore throat, diarrhea, nausea, vomiting, cold or Flu symptoms, recent or current infections. It is specially important if the infection is over the area that we intend to treat. Marland Kitchen Heart and lung problems: Symptoms that may suggest an active cardiopulmonary problem  include: cough, chest pain, breathing difficulties or shortness of breath, dizziness, ankle swelling, uncontrolled high or unusually low blood pressure, and/or palpitations. If you are experiencing any of these symptoms, cancel your procedure and contact your primary care physician for an evaluation.  Remember:  Regular Business hours are:  Monday to Thursday 8:00 AM to 4:00 PM  Provider's Schedule: Milinda Pointer, MD:  Procedure days: Tuesday and Thursday 7:30 AM to 4:00 PM  Gillis Santa, MD:  Procedure days: Monday and Wednesday 7:30 AM to 4:00 PM ____________________________________________________________________________________________

## 2018-05-14 ENCOUNTER — Ambulatory Visit: Payer: No Typology Code available for payment source | Admitting: Pain Medicine

## 2018-05-18 ENCOUNTER — Telehealth: Payer: Self-pay | Admitting: Pharmacy Technician

## 2018-05-18 DIAGNOSIS — C9001 Multiple myeloma in remission: Secondary | ICD-10-CM

## 2018-05-18 MED ORDER — LENALIDOMIDE 5 MG PO CAPS: ORAL_CAPSULE | ORAL | 0 refills | Status: DC

## 2018-05-18 NOTE — Telephone Encounter (Signed)
I ordered it and sent rems # on the refill rx

## 2018-05-20 ENCOUNTER — Encounter: Payer: Self-pay | Admitting: *Deleted

## 2018-05-21 ENCOUNTER — Ambulatory Visit: Payer: No Typology Code available for payment source | Admitting: Pain Medicine

## 2018-05-21 DIAGNOSIS — M160 Bilateral primary osteoarthritis of hip: Secondary | ICD-10-CM | POA: Insufficient documentation

## 2018-05-21 NOTE — Progress Notes (Deleted)
Patient's Name: James House.  MRN: [id number]  Referring Provider: Milinda Pointer, MD  DOB: 1970/09/22  PCP: Pleas Koch, NP  DOS: 05/21/2018  Note by: Gaspar Cola, MD  Service setting: Ambulatory outpatient  Specialty: Interventional Pain Management  Patient type: Established  Location: [facility] (AMB) Pain Management Facility  Visit type: Interventional Procedure   Primary Reason for Visit: Interventional Pain Management Treatment. CC: No chief complaint on file.  Procedure:          Anesthesia, Analgesia, Anxiolysis:  Type: Intra-Articular Hip Injection #1  Primary Purpose: Diagnostic Region: Posterolateral hip joint area. Level: Lower pelvic and hip joint level. Target Area: Superior aspect of the hip joint cavity, going thru the superior portion of the capsular ligament. Approach: Posterolateral approach. Laterality: Bilateral  Type: Moderate (Conscious) Sedation combined with Local Anesthesia Indication(s): Analgesia and Anxiety Route: Intravenous (IV) IV Access: Secured Sedation: Meaningful verbal contact was maintained at all times during the procedure  Local Anesthetic: Lidocaine 1-2%  Position: Lateral Decubitus with bad side up Prepped Area: Entire Posterolateral hip area. Prepping solution: ChloraPrep (2% chlorhexidine gluconate and 70% isopropyl alcohol)   Indications: 1. Chronic hip pain (Bilateral) (R>L)   2. Osteoarthritis of hips (Bilateral)   3. Multiple myeloma in remission [facility])    Pain Score: Pre-procedure:  /10 Post-procedure:  /10  Pre-op Assessment:  [name] is a 48 y.o. (year old), male patient, seen today for interventional treatment. He  has a past surgical history that includes Joint replacement; Knee surgery (Left); and Abdominal surgery. [name] has a current medication list which includes the following prescription(s): acetaminophen, acyclovir, albuterol, aspirin ec, baclofen, gabapentin, hydrocodone-homatropine, lenalidomide,  oxycodone, oxycodone hcl (abuse deter), oxycodone hcl (abuse deter), pantoprazole, pregabalin, promethazine, and vitamin d (ergocalciferol). His primarily concern today is the No chief complaint on file.  Initial Vital Signs:  Pulse/HCG Rate:    Temp:   Resp:   BP:   SpO2:    BMI: Estimated body mass index is 23.49 kg/m as calculated from the following:   Height as of 05/04/18: '5\' 7"'  (1.702 m).   Weight as of 05/04/18: 150 lb (68 kg).  Risk Assessment: Allergies: Reviewed. He has No Known Allergies.  Allergy Precautions: None required Coagulopathies: Reviewed. None identified.  Blood-thinner therapy: None at this time Active Infection(s): Reviewed. None identified. [name] is afebrile  Site Confirmation: [name] was asked to confirm the procedure and laterality before marking the site Procedure checklist: Completed Consent: Before the procedure and under the influence of no sedative(s), amnesic(s), or anxiolytics, the patient was informed of the treatment options, risks and possible complications. To fulfill our ethical and legal obligations, as recommended by the American Medical Association's Code of Ethics, I have informed the patient of my clinical impression; the nature and purpose of the treatment or procedure; the risks, benefits, and possible complications of the intervention; the alternatives, including doing nothing; the risk(s) and benefit(s) of the alternative treatment(s) or procedure(s); and the risk(s) and benefit(s) of doing nothing. The patient was provided information about the general risks and possible complications associated with the procedure. These may include, but are not limited to: failure to achieve desired goals, infection, bleeding, organ or nerve damage, allergic reactions, paralysis, and death. In addition, the patient was informed of those risks and complications associated to the procedure, such as failure to decrease pain; infection; bleeding; organ or  nerve damage with subsequent damage to sensory, motor, and/or autonomic systems, resulting in permanent pain,  numbness, and/or weakness of one or several areas of the body; allergic reactions; (i.e.: anaphylactic reaction); and/or death. Furthermore, the patient was informed of those risks and complications associated with the medications. These include, but are not limited to: allergic reactions (i.e.: anaphylactic or anaphylactoid reaction(s)); adrenal axis suppression; blood sugar elevation that in diabetics may result in ketoacidosis or comma; water retention that in patients with history of congestive heart failure may result in shortness of breath, pulmonary edema, and decompensation with resultant heart failure; weight gain; swelling or edema; medication-induced neural toxicity; particulate matter embolism and blood vessel occlusion with resultant organ, and/or nervous system infarction; and/or aseptic necrosis of one or more joints. Finally, the patient was informed that Medicine is not an exact science; therefore, there is also the possibility of unforeseen or unpredictable risks and/or possible complications that may result in a catastrophic outcome. The patient indicated having understood very clearly. We have given the patient no guarantees and we have made no promises. Enough time was given to the patient to ask questions, all of which were answered to the patient's satisfaction. [name] has indicated that he wanted to continue with the procedure. Attestation: I, the ordering provider, attest that I have discussed with the patient the benefits, risks, side-effects, alternatives, likelihood of achieving goals, and potential problems during recovery for the procedure that I have provided informed consent. Date  Time: {CHL [facility]-PAIN TIME CHOICES:21018001}  Pre-Procedure Preparation:  Monitoring: As per clinic protocol. Respiration, ETCO2, SpO2, BP, heart rate and rhythm monitor placed and checked  for adequate function Safety Precautions: Patient was assessed for positional comfort and pressure points before starting the procedure. Time-out: I initiated and conducted the "Time-out" before starting the procedure, as per protocol. The patient was asked to participate by confirming the accuracy of the "Time Out" information. Verification of the correct person, site, and procedure were performed and confirmed by me, the nursing staff, and the patient. "Time-out" conducted as per Joint Commission's Universal Protocol (UP.01.01.01). Time:    Description of Procedure:          Safety Precautions: Aspiration looking for blood return was conducted prior to all injections. At no point did we inject any substances, as a needle was being advanced. No attempts were made at seeking any paresthesias. Safe injection practices and needle disposal techniques used. Medications properly checked for expiration dates. SDV (single dose vial) medications used. Description of the Procedure: Protocol guidelines were followed. The patient was placed in position over the fluoroscopy table. The target area was identified and the area prepped in the usual manner. Skin & deeper tissues infiltrated with local anesthetic. Appropriate amount of time allowed to pass for local anesthetics to take effect. The procedure needles were then advanced to the target area. Proper needle placement secured. Negative aspiration confirmed. Solution injected in intermittent fashion, asking for systemic symptoms every 0.5cc of injectate. The needles were then removed and the area cleansed, making sure to leave some of the prepping solution back to take advantage of its long term bactericidal properties. There were no vitals filed for this visit.  Start Time:   hrs. End Time:   hrs. Materials:  Needle(s) Type: Spinal Needle Gauge: 22G Length: 5.0-in Medication(s): Please see orders for medications and dosing details.  Imaging Guidance  (Non-Spinal):          Type of Imaging Technique: Fluoroscopy Guidance (Non-Spinal) Indication(s): Assistance in needle guidance and placement for procedures requiring needle placement in or near specific anatomical locations not  easily accessible without such assistance. Exposure Time: Please see nurses notes. Contrast: Before injecting any contrast, we confirmed that the patient did not have an allergy to iodine, shellfish, or radiological contrast. Once satisfactory needle placement was completed at the desired level, radiological contrast was injected. Contrast injected under live fluoroscopy. No contrast complications. See chart for type and volume of contrast used. Fluoroscopic Guidance: I was personally present during the use of fluoroscopy. "Tunnel Vision Technique" used to obtain the best possible view of the target area. Parallax error corrected before commencing the procedure. "Direction-depth-direction" technique used to introduce the needle under continuous pulsed fluoroscopy. Once target was reached, antero-posterior, oblique, and lateral fluoroscopic projection used confirm needle placement in all planes. Images permanently stored in EMR. Interpretation: I personally interpreted the imaging intraoperatively. Adequate needle placement confirmed in multiple planes. Appropriate spread of contrast into desired area was observed. No evidence of afferent or efferent intravascular uptake. Permanent images saved into the patient's record.  Antibiotic Prophylaxis:   Anti-infectives (From admission, onward)   None     Indication(s): None identified  Post-operative Assessment:  Post-procedure Vital Signs:  Pulse/HCG Rate:    Temp:   Resp:   BP:   SpO2:    EBL: None  Complications: No immediate post-treatment complications observed by team, or reported by patient.  Note: The patient tolerated the entire procedure well. A repeat set of vitals were taken after the procedure and the  patient was kept under observation following institutional policy, for this type of procedure. Post-procedural neurological assessment was performed, showing return to baseline, prior to discharge. The patient was provided with post-procedure discharge instructions, including a section on how to identify potential problems. Should any problems arise concerning this procedure, the patient was given instructions to immediately contact us, at any time, without hesitation. In any case, we plan to contact the patient by telephone for a follow-up status report regarding this interventional procedure.  Comments:  No additional relevant information.  Plan of Care   Imaging Orders  No imaging studies ordered today   Procedure Orders    No procedure(s) ordered today    Medications ordered for procedure: No orders of the defined types were placed in this encounter.  Medications administered: [name] had no medications administered during this visit.  See the medical record for exact dosing, route, and time of administration.  Disposition: Discharge home  Discharge Date & Time: 05/21/2018;   hrs.   Physician-requested Follow-up: No follow-ups on file.  Future Appointments  Date Time Provider [place]  05/21/2018  9:00 AM Milinda Pointer, MD [facility] None  06/01/2018  9:00 AM Abbie Sons, MD [facility] None  06/12/2018  9:30 AM [facility] None  06/12/2018 10:00 AM Sindy Guadeloupe, MD [facility] None  06/12/2018 10:30 AM [facility] INJECTION [facility] None  06/18/2018 11:30 AM [facility]JE[facility] None  07/28/2018  9:00 AM Vevelyn Francois, NP [facility] None   Primary Care Physician: Pleas Koch, NP Location: [facility] Note by: Gaspar Cola, MD Date: 05/21/2018; Time: 5:17 AM  Disclaimer:  Medicine is not an exact science. The only guarantee in medicine is that nothing is guaranteed. It is important to note  that the decision to proceed with this intervention was based on the information collected from the patient. The Data and conclusions were drawn from the patient's questionnaire, the interview, and the physical examination. Because the information was provided in large part by the patient,  it cannot be guaranteed that it has not been purposely or unconsciously manipulated. Every effort has been made to obtain as much relevant data as possible for this evaluation. It is important to note that the conclusions that lead to this procedure are derived in large part from the available data. Always take into account that the treatment will also be dependent on availability of resources and existing treatment guidelines, considered by other Pain Management Practitioners as being common knowledge and practice, at the time of the intervention. For Medico-Legal purposes, it is also important to point out that variation in procedural techniques and pharmacological choices are the acceptable norm. The indications, contraindications, technique, and results of the above procedure should only be interpreted and judged by a Board-Certified Interventional Pain Specialist with extensive familiarity and expertise in the same exact procedure and technique.

## 2018-05-25 ENCOUNTER — Ambulatory Visit: Payer: No Typology Code available for payment source | Admitting: Urology

## 2018-05-26 ENCOUNTER — Other Ambulatory Visit: Payer: Self-pay

## 2018-05-26 ENCOUNTER — Ambulatory Visit
Admission: RE | Admit: 2018-05-26 | Discharge: 2018-05-26 | Disposition: A | Discharge status: Home / Self Care | Payer: No Typology Code available for payment source | Source: Ambulatory Visit | Attending: Pain Medicine | Admitting: Pain Medicine

## 2018-05-26 ENCOUNTER — Ambulatory Visit (HOSPITAL_BASED_OUTPATIENT_CLINIC_OR_DEPARTMENT_OTHER): Payer: No Typology Code available for payment source | Admitting: Pain Medicine

## 2018-05-26 ENCOUNTER — Encounter: Payer: Self-pay | Admitting: Pain Medicine

## 2018-05-26 VITALS — BP 140/99 | HR 69 | Temp 98.2°F | Resp 14 | Ht 67.0 in | Wt 150.0 lb

## 2018-05-26 DIAGNOSIS — M25552 Pain in left hip: Secondary | ICD-10-CM

## 2018-05-26 DIAGNOSIS — M160 Bilateral primary osteoarthritis of hip: Secondary | ICD-10-CM

## 2018-05-26 DIAGNOSIS — M25551 Pain in right hip: Secondary | ICD-10-CM | POA: Insufficient documentation

## 2018-05-26 DIAGNOSIS — G8929 Other chronic pain: Secondary | ICD-10-CM | POA: Diagnosis present

## 2018-05-26 MED ORDER — MIDAZOLAM HCL 5 MG/5ML IJ SOLN: 1.0000 mg | INTRAMUSCULAR | Status: DC | PRN

## 2018-05-26 MED ORDER — FENTANYL CITRATE (PF) 100 MCG/2ML IJ SOLN: 25.0000 ug | INTRAMUSCULAR | Status: DC | PRN

## 2018-05-26 MED ORDER — METHYLPREDNISOLONE ACETATE 80 MG/ML IJ SUSP
80.0000 mg | Freq: Once | INTRAMUSCULAR | Status: AC
  Administered 2018-05-26: 80 mg via INTRA_ARTICULAR
  Filled 2018-05-26: qty 1

## 2018-05-26 MED ORDER — LIDOCAINE HCL 2 % IJ SOLN
20.0000 mL | Freq: Once | INTRAMUSCULAR | Status: AC
  Administered 2018-05-26: 400 mg
  Filled 2018-05-26: qty 40

## 2018-05-26 MED ORDER — IOPAMIDOL (ISOVUE-M 200) INJECTION 41%
10.0000 mL | Freq: Once | INTRAMUSCULAR | Status: AC
  Administered 2018-05-26: 10 mL via INTRA_ARTICULAR
  Filled 2018-05-26: qty 10

## 2018-05-26 MED ORDER — LACTATED RINGERS IV SOLN: 1000.0000 mL | Freq: Once | INTRAVENOUS | Status: DC

## 2018-05-26 MED ORDER — ROPIVACAINE HCL 2 MG/ML IJ SOLN
9.0000 mL | Freq: Once | INTRAMUSCULAR | Status: AC
  Administered 2018-05-26: 9 mL via INTRA_ARTICULAR
  Filled 2018-05-26: qty 10

## 2018-05-26 NOTE — Patient Instructions (Addendum)
____________________________________________________________________________________________  Post-Procedure Discharge Instructions  Instructions:  Apply ice: Fill a plastic sandwich bag with crushed ice. Cover it with a small towel and apply to injection site. Apply for 15 minutes then remove x 15 minutes. Repeat sequence on day of procedure, until you go to bed. The purpose is to minimize swelling and discomfort after procedure.  Apply heat: Apply heat to procedure site starting the day following the procedure. The purpose is to treat any soreness and discomfort from the procedure.  Food intake: Start with clear liquids (like water) and advance to regular food, as tolerated.   Physical activities: Keep activities to a minimum for the first 8 hours after the procedure.   Driving: If you have received any sedation, you are not allowed to drive for 24 hours after your procedure.  Blood thinner: Restart your blood thinner 6 hours after your procedure. (Only for those taking blood thinners)  Insulin: As soon as you can eat, you may resume your normal dosing schedule. (Only for those taking insulin)  Infection prevention: Keep procedure site clean and dry.  Post-procedure Pain Diary: Extremely important that this be done correctly and accurately. Recorded information will be used to determine the next step in treatment.  Pain evaluated is that of treated area only. Do not include pain from an untreated area.  Complete every hour, on the hour, for the initial 8 hours. Set an alarm to help you do this part accurately.  Do not go to sleep and have it completed later. It will not be accurate.  Follow-up appointment: Keep your follow-up appointment after the procedure. Usually 2 weeks for most procedures. (6 weeks in the case of radiofrequency.) Bring you pain diary.   Expect:  From numbing medicine (AKA: Local Anesthetics): Numbness or decrease in pain.  Onset: Full effect within 15  minutes of injected.  Duration: It will depend on the type of local anesthetic used. On the average, 1 to 8 hours.   From steroids: Decrease in swelling or inflammation. Once inflammation is improved, relief of the pain will follow.  Onset of benefits: Depends on the amount of swelling present. The more swelling, the longer it will take for the benefits to be seen. In some cases, up to 10 days.  Duration: Steroids will stay in the system x 2 weeks. Duration of benefits will depend on multiple posibilities including persistent irritating factors.  Occasional side-effects: Facial flushing (red, warm cheeks) , cramps (if present, drink Gatorade and take over-the-counter Magnesium 450-500 mg once to twice a day).  From procedure: Some discomfort is to be expected once the numbing medicine wears off. This should be minimal if ice and heat are applied as instructed.  Call if:  You experience numbness and weakness that gets worse with time, as opposed to wearing off.  New onset bowel or bladder incontinence. (This applies to Spinal procedures only)  Emergency Numbers:  Durning business hours (Monday - Thursday, 8:00 AM - 4:00 PM) (Friday, 9:00 AM - 12:00 Noon): [phone]  After hours: [phone] ____________________________________________________________________________________________   Pain Management Discharge Instructions  General Discharge Instructions :  If you need to reach your doctor call: Monday-Friday 8:00 am - 4:00 pm at [phone] or toll free [phone].  After clinic hours [phone] to have operator reach doctor.  Bring all of your medication bottles to all your appointments in the pain clinic.  To cancel or reschedule your appointment with Pain Management please remember to call 24 hours in advance to   avoid a fee.  Refer to the educational materials which you have been given on: General Risks, I had my Procedure. Discharge Instructions, Post  Sedation.  Post Procedure Instructions:  The drugs you were given will stay in your system until tomorrow, so for the next 24 hours you should not drive, make any legal decisions or drink any alcoholic beverages.  You may eat anything you prefer, but it is better to start with liquids then soups and crackers, and gradually work up to solid foods.  Please notify your doctor immediately if you have any unusual bleeding, trouble breathing or pain that is not related to your normal pain.  Depending on the type of procedure that was done, some parts of your body may feel week and/or numb.  This usually clears up by tonight or the next day.  Walk with the use of an assistive device or accompanied by an adult for the 24 hours.  You may use ice on the affected area for the first 24 hours.  Put ice in a Ziploc bag and cover with a towel and place against area 15 minutes on 15 minutes off.  You may switch to heat after 24 hours. 

## 2018-05-26 NOTE — Progress Notes (Signed)
Patient's Name: James House.  MRN: [id number]  Referring Provider: Milinda Pointer, MD  DOB: December 09, 1970  PCP: Pleas Koch, NP  DOS: 05/26/2018  Note by: Gaspar Cola, MD  Service setting: Ambulatory outpatient  Specialty: Interventional Pain Management  Patient type: Established  Location: [facility] (AMB) Pain Management Facility  Visit type: Interventional Procedure   Primary Reason for Visit: Interventional Pain Management Treatment. CC: Hip Pain (left)  Procedure:          Anesthesia, Analgesia, Anxiolysis:  Type: Intra-Articular Hip Injection #1  Primary Purpose: Diagnostic Region: Posterolateral hip joint area. Level: Lower pelvic and hip joint level. Target Area: Superior aspect of the hip joint cavity, going thru the superior portion of the capsular ligament. Approach: Posterolateral approach. Laterality: Bilateral  Type: Local Anesthesia Indication(s): Analgesia         Route: Infiltration ([place]/IM) IV Access: Declined Sedation: Declined  Local Anesthetic: Lidocaine 1-2%  Position: Prone Prepped Area: Entire Posterolateral hip area. Prepping solution: ChloraPrep (2% chlorhexidine gluconate and 70% isopropyl alcohol)   Indications: 1. Osteoarthritis of hips (Bilateral)   2. Chronic hip pain (Bilateral) (R>L)    Pain Score: Pre-procedure: 6 /10 Post-procedure: 6 /10  Pre-op Assessment:  James House is a 48 y.o. (year old), male patient, seen today for interventional treatment. He  has a past surgical history that includes Joint replacement; Knee surgery (Left); and Abdominal surgery. James House has a current medication list which includes the following prescription(s): acetaminophen, acyclovir, albuterol, aspirin ec, baclofen, gabapentin, hydrocodone-homatropine, lenalidomide, oxycodone, oxycodone hcl (abuse deter), oxycodone hcl (abuse deter), pantoprazole, pregabalin, promethazine, and vitamin d (ergocalciferol). His primarily concern today is the Hip Pain  (left)  Initial Vital Signs:  Pulse/HCG Rate: 69ECG Heart Rate: 72 Temp: 98.2 F (36.8 C) Resp: 16 BP: 129/88 SpO2: 100 %  BMI: Estimated body mass index is 23.49 kg/m as calculated from the following:   Height as of this encounter: 5\' 7"  (1.702 m).   Weight as of this encounter: 150 lb (68 kg).  Risk Assessment: Allergies: Reviewed. He has No Known Allergies.  Allergy Precautions: None required Coagulopathies: Reviewed. None identified.  Blood-thinner therapy: None at this time Active Infection(s): Reviewed. None identified. [name] is afebrile  Site Confirmation: James House was asked to confirm the procedure and laterality before marking the site Procedure checklist: Completed Consent: Before the procedure and under the influence of no sedative(s), amnesic(s), or anxiolytics, the patient was informed of the treatment options, risks and possible complications. To fulfill our ethical and legal obligations, as recommended by the American Medical Association's Code of Ethics, I have informed the patient of my clinical impression; the nature and purpose of the treatment or procedure; the risks, benefits, and possible complications of the intervention; the alternatives, including doing nothing; the risk(s) and benefit(s) of the alternative treatment(s) or procedure(s); and the risk(s) and benefit(s) of doing nothing. The patient was provided information about the general risks and possible complications associated with the procedure. These may include, but are not limited to: failure to achieve desired goals, infection, bleeding, organ or nerve damage, allergic reactions, paralysis, and death. In addition, the patient was informed of those risks and complications associated to the procedure, such as failure to decrease pain; infection; bleeding; organ or nerve damage with subsequent damage to sensory, motor, and/or autonomic systems, resulting in permanent pain, numbness, and/or weakness of  one or several areas of the body; allergic reactions; (i.e.: anaphylactic reaction); and/or death. Furthermore, the patient was informed  of those risks and complications associated with the medications. These include, but are not limited to: allergic reactions (i.e.: anaphylactic or anaphylactoid reaction(s)); adrenal axis suppression; blood sugar elevation that in diabetics may result in ketoacidosis or comma; water retention that in patients with history of congestive heart failure may result in shortness of breath, pulmonary edema, and decompensation with resultant heart failure; weight gain; swelling or edema; medication-induced neural toxicity; particulate matter embolism and blood vessel occlusion with resultant organ, and/or nervous system infarction; and/or aseptic necrosis of one or more joints. Finally, the patient was informed that Medicine is not an exact science; therefore, there is also the possibility of unforeseen or unpredictable risks and/or possible complications that may result in a catastrophic outcome. The patient indicated having understood very clearly. We have given the patient no guarantees and we have made no promises. Enough time was given to the patient to ask questions, all of which were answered to the patient's satisfaction. James House has indicated that he wanted to continue with the procedure. Attestation: I, the ordering provider, attest that I have discussed with the patient the benefits, risks, side-effects, alternatives, likelihood of achieving goals, and potential problems during recovery for the procedure that I have provided informed consent. Date  Time: 05/26/2018  8:41 AM  Pre-Procedure Preparation:  Monitoring: As per clinic protocol. Respiration, ETCO2, SpO2, BP, heart rate and rhythm monitor placed and checked for adequate function Safety Precautions: Patient was assessed for positional comfort and pressure points before starting the procedure. Time-out: I  initiated and conducted the "Time-out" before starting the procedure, as per protocol. The patient was asked to participate by confirming the accuracy of the "Time Out" information. Verification of the correct person, site, and procedure were performed and confirmed by me, the nursing staff, and the patient. "Time-out" conducted as per Joint Commission's Universal Protocol (UP.01.01.01). Time: 0919  Description of Procedure:          Safety Precautions: Aspiration looking for blood return was conducted prior to all injections. At no point did we inject any substances, as a needle was being advanced. No attempts were made at seeking any paresthesias. Safe injection practices and needle disposal techniques used. Medications properly checked for expiration dates. SDV (single dose vial) medications used. Description of the Procedure: Protocol guidelines were followed. The patient was placed in position over the fluoroscopy table. The target area was identified and the area prepped in the usual manner. Skin & deeper tissues infiltrated with local anesthetic. Appropriate amount of time allowed to pass for local anesthetics to take effect. The procedure needles were then advanced to the target area. Proper needle placement secured. Negative aspiration confirmed. Solution injected in intermittent fashion, asking for systemic symptoms every 0.5cc of injectate. The needles were then removed and the area cleansed, making sure to leave some of the prepping solution back to take advantage of its long term bactericidal properties. Vitals:   05/26/18 0916 05/26/18 0921 05/26/18 0926 05/26/18 0929  BP: (!) 139/91 (!) 138/96 (!) 136/96 (!) 140/99  Pulse:      Resp: 16 18 16 14   Temp:      TempSrc:      SpO2: 99% 100% 98% 96%  Weight:      Height:        Start Time: 0919 hrs. End Time: 0929 hrs. Materials:  Needle(s) Type: Spinal Needle Gauge: 22G Length: 5.0-in Medication(s): Please see orders for medications  and dosing details.  Imaging Guidance (Non-Spinal):  Type of Imaging Technique: Fluoroscopy Guidance (Non-Spinal) Indication(s): Assistance in needle guidance and placement for procedures requiring needle placement in or near specific anatomical locations not easily accessible without such assistance. Exposure Time: Please see nurses notes. Contrast: Before injecting any contrast, we confirmed that the patient did not have an allergy to iodine, shellfish, or radiological contrast. Once satisfactory needle placement was completed at the desired level, radiological contrast was injected. Contrast injected under live fluoroscopy. No contrast complications. See chart for type and volume of contrast used. Fluoroscopic Guidance: I was personally present during the use of fluoroscopy. "Tunnel Vision Technique" used to obtain the best possible view of the target area. Parallax error corrected before commencing the procedure. "Direction-depth-direction" technique used to introduce the needle under continuous pulsed fluoroscopy. Once target was reached, antero-posterior, oblique, and lateral fluoroscopic projection used confirm needle placement in all planes. Images permanently stored in EMR. Interpretation: I personally interpreted the imaging intraoperatively. Adequate needle placement confirmed in multiple planes. Appropriate spread of contrast into desired area was observed. No evidence of afferent or efferent intravascular uptake. Permanent images saved into the patient's record.  Antibiotic Prophylaxis:   Anti-infectives (From admission, onward)   None     Indication(s): None identified  Post-operative Assessment:  Post-procedure Vital Signs:  Pulse/HCG Rate: 6968 Temp: 98.2 F (36.8 C) Resp: 14 BP: (!) 140/99 SpO2: 96 %  EBL: None  Complications: No immediate post-treatment complications observed by team, or reported by patient.  Note: The patient tolerated the entire procedure  well. A repeat set of vitals were taken after the procedure and the patient was kept under observation following institutional policy, for this type of procedure. Post-procedural neurological assessment was performed, showing return to baseline, prior to discharge. The patient was provided with post-procedure discharge instructions, including a section on how to identify potential problems. Should any problems arise concerning this procedure, the patient was given instructions to immediately contact us, at any time, without hesitation. In any case, we plan to contact the patient by telephone for a follow-up status report regarding this interventional procedure.  Comments:  No additional relevant information.  Plan of Care    Imaging Orders     DG C-Arm 1-60 Min-No Report  Procedure Orders     HIP INJECTION  Medications ordered for procedure: Meds ordered this encounter  Medications  . iopamidol (ISOVUE-M) 41 % intrathecal injection 10 mL    Must be Myelogram-compatible. If not available, you may substitute with a water-soluble, non-ionic, hypoallergenic, myelogram-compatible radiological contrast medium.  Marland Kitchen lidocaine (XYLOCAINE) 2 % (with pres) injection 400 mg  . DISCONTD: midazolam (VERSED) 5 MG/5ML injection 1-2 mg    Make sure Flumazenil is available in the pyxis when using this medication. If oversedation occurs, administer 0.2 mg IV over 15 sec. If after 45 sec no response, administer 0.2 mg again over 1 min; may repeat at 1 min intervals; not to exceed 4 doses (1 mg)  . DISCONTD: fentaNYL (SUBLIMAZE) injection 25-50 mcg    Make sure Narcan is available in the pyxis when using this medication. In the event of respiratory depression (RR< 8/min): Titrate NARCAN (naloxone) in increments of 0.1 to 0.2 mg IV at 2-3 minute intervals, until desired degree of reversal.  . DISCONTD: lactated ringers infusion 1,000 mL  . ropivacaine (PF) 2 mg/mL (0.2%) (NAROPIN) injection 9 mL  .  methylPREDNISolone acetate (DEPO-MEDROL) injection 80 mg   Medications administered: We administered iopamidol, lidocaine, ropivacaine (PF) 2 mg/mL (0.2%), and methylPREDNISolone acetate.  See the medical record for exact dosing, route, and time of administration.  Disposition: Discharge home  Discharge Date & Time: 05/26/2018; 0945 hrs.   Physician-requested Follow-up: No follow-ups on file.  Future Appointments  Date Time Provider [place]  06/01/2018  9:00 AM Bernardo Heater, Ronda Fairly, MD [facility] None  06/12/2018  9:30 AM [facility] None  06/12/2018 10:00 AM Sindy Guadeloupe, MD [facility] None  06/12/2018 10:30 AM [facility] INJECTION [facility] None  06/15/2018  9:15 AM Milinda Pointer, MD [facility] None  06/18/2018 11:30 AM [facility] INJECTION [facility] None  07/28/2018  9:00 AM Vevelyn Francois, NP [facility] None   Primary Care Physician: Pleas Koch, NP Location: [facility] Note by: Gaspar Cola, MD Date: 05/26/2018; Time: 9:46 AM  Disclaimer:  Medicine is not an Chief Strategy Officer. The only guarantee in medicine is that nothing is guaranteed. It is important to note that the decision to proceed with this intervention was based on the information collected from the patient. The Data and conclusions were drawn from the patient's questionnaire, the interview, and the physical examination. Because the information was provided in large part by the patient, it cannot be guaranteed that it has not been purposely or unconsciously manipulated. Every effort has been made to obtain as much relevant data as possible for this evaluation. It is important to note that the conclusions that lead to this procedure are derived in large part from the available data. Always take into account that the treatment will also be dependent on availability of resources and existing treatment guidelines, considered by other Pain Management Practitioners as being common  knowledge and practice, at the time of the intervention. For Medico-Legal purposes, it is also important to point out that variation in procedural techniques and pharmacological choices are the acceptable norm. The indications, contraindications, technique, and results of the above procedure should only be interpreted and judged by a Board-Certified Interventional Pain Specialist with extensive familiarity and expertise in the same exact procedure and technique.

## 2018-05-26 NOTE — Progress Notes (Signed)
Safety precautions to be maintained throughout the outpatient stay will include: orient to surroundings, keep bed in low position, maintain call bell within reach at all times, provide assistance with transfer out of bed and ambulation.  

## 2018-05-27 ENCOUNTER — Telehealth: Payer: Self-pay

## 2018-05-27 NOTE — Telephone Encounter (Signed)
Post procedure phone call.  LM 

## 2018-05-28 ENCOUNTER — Telehealth: Payer: Self-pay | Admitting: Pain Medicine

## 2018-05-28 NOTE — Telephone Encounter (Signed)
Pt left a voicemail stating that he had a procedure on his hips Tuesday with Dr. Dossie Arbour and said he is having excruciating pain in his left him and wants a nurse to call him back.

## 2018-05-28 NOTE — Telephone Encounter (Signed)
Patient reassurred that this is to be expected. Denies fever, paralysis of leg.

## 2018-06-01 ENCOUNTER — Ambulatory Visit: Payer: No Typology Code available for payment source | Admitting: Urology

## 2018-06-01 ENCOUNTER — Encounter: Payer: Self-pay | Admitting: Urology

## 2018-06-03 ENCOUNTER — Telehealth: Payer: Self-pay

## 2018-06-03 NOTE — Telephone Encounter (Signed)
Patient was e-scribed for three months. Called and explained to patient.

## 2018-06-03 NOTE — Telephone Encounter (Signed)
He runs out of oxycodone tomorrow and said he was told to come back to see [facility] in April. Shall I schedule him a med refill appt with [facility] asap? I'm not sure of his medication schedule so that's why I am sending a message.

## 2018-06-09 ENCOUNTER — Other Ambulatory Visit: Payer: Self-pay | Admitting: *Deleted

## 2018-06-09 DIAGNOSIS — C9001 Multiple myeloma in remission: Secondary | ICD-10-CM

## 2018-06-10 ENCOUNTER — Other Ambulatory Visit: Payer: Self-pay | Admitting: *Deleted

## 2018-06-10 ENCOUNTER — Encounter: Payer: Self-pay | Admitting: Oncology

## 2018-06-10 ENCOUNTER — Telehealth: Payer: Self-pay | Admitting: *Deleted

## 2018-06-10 DIAGNOSIS — C9001 Multiple myeloma in remission: Secondary | ICD-10-CM

## 2018-06-10 MED ORDER — LENALIDOMIDE 5 MG PO CAPS: ORAL_CAPSULE | ORAL | 0 refills | Status: DC

## 2018-06-10 NOTE — Telephone Encounter (Signed)
Patient called to . inquire about his Revlimed Prescription.

## 2018-06-10 NOTE — Telephone Encounter (Signed)
I have made new rx for revlimid and sent it out to [facility] for refill

## 2018-06-11 ENCOUNTER — Encounter: Payer: Self-pay | Admitting: Oncology

## 2018-06-12 ENCOUNTER — Inpatient Hospital Stay: Payer: No Typology Code available for payment source

## 2018-06-12 ENCOUNTER — Other Ambulatory Visit: Payer: Self-pay | Admitting: *Deleted

## 2018-06-12 ENCOUNTER — Inpatient Hospital Stay: Payer: No Typology Code available for payment source | Attending: Oncology | Admitting: Oncology

## 2018-06-12 ENCOUNTER — Other Ambulatory Visit: Payer: Self-pay

## 2018-06-12 VITALS — BP 120/90 | HR 102 | Temp 97.6°F | Ht 67.0 in | Wt 156.0 lb

## 2018-06-12 DIAGNOSIS — Z5112 Encounter for antineoplastic immunotherapy: Secondary | ICD-10-CM | POA: Insufficient documentation

## 2018-06-12 DIAGNOSIS — C9001 Multiple myeloma in remission: Secondary | ICD-10-CM

## 2018-06-12 DIAGNOSIS — Z7982 Long term (current) use of aspirin: Secondary | ICD-10-CM | POA: Diagnosis not present

## 2018-06-12 DIAGNOSIS — Z9484 Stem cells transplant status: Secondary | ICD-10-CM

## 2018-06-12 DIAGNOSIS — Z79899 Other long term (current) drug therapy: Secondary | ICD-10-CM | POA: Diagnosis not present

## 2018-06-12 DIAGNOSIS — G8929 Other chronic pain: Secondary | ICD-10-CM

## 2018-06-12 DIAGNOSIS — Z87891 Personal history of nicotine dependence: Secondary | ICD-10-CM

## 2018-06-12 DIAGNOSIS — Z9481 Bone marrow transplant status: Secondary | ICD-10-CM

## 2018-06-12 DIAGNOSIS — N183 Chronic kidney disease, stage 3 (moderate): Secondary | ICD-10-CM

## 2018-06-12 DIAGNOSIS — Z23 Encounter for immunization: Secondary | ICD-10-CM | POA: Insufficient documentation

## 2018-06-12 DIAGNOSIS — T451X5A Adverse effect of antineoplastic and immunosuppressive drugs, initial encounter: Secondary | ICD-10-CM | POA: Diagnosis not present

## 2018-06-12 DIAGNOSIS — Z299 Encounter for prophylactic measures, unspecified: Secondary | ICD-10-CM

## 2018-06-12 DIAGNOSIS — M549 Dorsalgia, unspecified: Secondary | ICD-10-CM

## 2018-06-12 DIAGNOSIS — G620 Drug-induced polyneuropathy: Secondary | ICD-10-CM | POA: Diagnosis not present

## 2018-06-12 DIAGNOSIS — I10 Essential (primary) hypertension: Secondary | ICD-10-CM | POA: Diagnosis not present

## 2018-06-12 DIAGNOSIS — Z7983 Long term (current) use of bisphosphonates: Secondary | ICD-10-CM

## 2018-06-12 LAB — CBC WITH DIFFERENTIAL/PLATELET
Abs Immature Granulocytes: 0 10*3/uL (ref 0.00–0.07)
Basophils Absolute: 0.1 10*3/uL (ref 0.0–0.1)
Basophils Relative: 3 %
Eosinophils Absolute: 0.2 10*3/uL (ref 0.0–0.5)
Eosinophils Relative: 9 %
HCT: 36.7 % — ABNORMAL LOW (ref 39.0–52.0)
Hemoglobin: 12.5 g/dL — ABNORMAL LOW (ref 13.0–17.0)
Immature Granulocytes: 0 %
Lymphocytes Relative: 19 %
Lymphs Abs: 0.5 10*3/uL — ABNORMAL LOW (ref 0.7–4.0)
MCH: 34.7 pg — ABNORMAL HIGH (ref 26.0–34.0)
MCHC: 34.1 g/dL (ref 30.0–36.0)
MCV: 101.9 fL — ABNORMAL HIGH (ref 80.0–100.0)
Monocytes Absolute: 0.4 10*3/uL (ref 0.1–1.0)
Monocytes Relative: 14 %
Neutro Abs: 1.5 10*3/uL — ABNORMAL LOW (ref 1.7–7.7)
Neutrophils Relative %: 55 %
Platelets: 131 10*3/uL — ABNORMAL LOW (ref 150–400)
RBC: 3.6 MIL/uL — ABNORMAL LOW (ref 4.22–5.81)
RDW: 15.4 % (ref 11.5–15.5)
WBC: 2.6 10*3/uL — ABNORMAL LOW (ref 4.0–10.5)
nRBC: 0 % (ref 0.0–0.2)

## 2018-06-12 LAB — COMPREHENSIVE METABOLIC PANEL
ALT: 38 U/L (ref 0–44)
AST: 44 U/L — ABNORMAL HIGH (ref 15–41)
Albumin: 3.7 g/dL (ref 3.5–5.0)
Alkaline Phosphatase: 64 U/L (ref 38–126)
Anion gap: 5 (ref 5–15)
BUN: 13 mg/dL (ref 6–20)
CHLORIDE: 108 mmol/L (ref 98–111)
CO2: 27 mmol/L (ref 22–32)
Calcium: 8.6 mg/dL — ABNORMAL LOW (ref 8.9–10.3)
Creatinine, Ser: 1.54 mg/dL — ABNORMAL HIGH (ref 0.61–1.24)
GFR calc Af Amer: >60 mL/min (ref >60)
GFR, EST NON AFRICAN AMERICAN: 53 mL/min — AB (ref >60)
Glucose, Bld: 73 mg/dL (ref 70–99)
Potassium: 4.1 mmol/L (ref 3.5–5.1)
Sodium: 140 mmol/L (ref 135–145)
Total Bilirubin: 0.7 mg/dL (ref 0.3–1.2)
Total Protein: 6.6 g/dL (ref 6.5–8.1)

## 2018-06-12 MED ORDER — DENOSUMAB 120 MG/1.7ML ~~LOC~~ SOLN
120.0000 mg | SUBCUTANEOUS | Status: DC
  Administered 2018-06-12: 120 mg via SUBCUTANEOUS
  Filled 2018-06-12: qty 1.7

## 2018-06-15 ENCOUNTER — Other Ambulatory Visit: Payer: Self-pay

## 2018-06-15 ENCOUNTER — Encounter: Payer: Self-pay | Admitting: Pain Medicine

## 2018-06-15 ENCOUNTER — Ambulatory Visit: Payer: No Typology Code available for payment source | Attending: Pain Medicine | Admitting: Pain Medicine

## 2018-06-15 VITALS — BP 134/86 | HR 64 | Temp 97.7°F | Resp 18 | Ht 67.0 in | Wt 150.0 lb

## 2018-06-15 DIAGNOSIS — M62830 Muscle spasm of back: Secondary | ICD-10-CM | POA: Diagnosis present

## 2018-06-15 DIAGNOSIS — M25551 Pain in right hip: Secondary | ICD-10-CM | POA: Insufficient documentation

## 2018-06-15 DIAGNOSIS — M545 Low back pain: Secondary | ICD-10-CM | POA: Insufficient documentation

## 2018-06-15 DIAGNOSIS — G8929 Other chronic pain: Secondary | ICD-10-CM | POA: Diagnosis present

## 2018-06-15 DIAGNOSIS — G5702 Lesion of sciatic nerve, left lower limb: Secondary | ICD-10-CM

## 2018-06-15 DIAGNOSIS — M7918 Myalgia, other site: Secondary | ICD-10-CM

## 2018-06-15 DIAGNOSIS — M25552 Pain in left hip: Secondary | ICD-10-CM | POA: Diagnosis present

## 2018-06-15 DIAGNOSIS — C9001 Multiple myeloma in remission: Secondary | ICD-10-CM | POA: Diagnosis not present

## 2018-06-15 LAB — KAPPA/LAMBDA LIGHT CHAINS
KAPPA FREE LGHT CHN: 28.8 mg/L — AB (ref 3.3–19.4)
Kappa, lambda light chain ratio: 1.2 (ref 0.26–1.65)
Lambda free light chains: 24.1 mg/L (ref 5.7–26.3)

## 2018-06-15 MED ORDER — BACLOFEN 10 MG PO TABS: 10.0000 mg | ORAL_TABLET | Freq: Four times a day (QID) | ORAL | 2 refills | Status: DC

## 2018-06-15 NOTE — Progress Notes (Signed)
Safety precautions to be maintained throughout the outpatient stay will include: orient to surroundings, keep bed in low position, maintain call bell within reach at all times, provide assistance with transfer out of bed and ambulation.  

## 2018-06-15 NOTE — Progress Notes (Signed)
Hematology/Oncology Consult note [facility]  [phone] [phone]  Patient Care Team: Pleas Koch, NP as PCP - General (Internal Medicine) Vella Redhead, MD as PCP - Hematology/Oncology   Name of the patient: James House  [id number]  October 26, 1970   Date of visit: 06/15/18  Diagnosis- light chain kappa multiple myeloma s/p ASCT currently in remission. Patient on maintenance revlimid  Chief complaint/ Reason for visit-routine follow-up of multiple myeloma on maintenance Revlimid  Heme/Onc history: patient is a 48 year old male with a history of kappa light chain myeloma that was treated by [name]. History is as follows:  1. Patient presented with renal insufficiency with a creatinine of 2.4 in April 2018. Kidney biopsy showed myeloma cast nephropathy. Kappa light chain was 3004 and lambda 0.22 with a kappa lambda ratio of 13,000 655. Skeletal survey showed multiple calvarial and marrow lesions. Bone marrow biopsy in May 2018 showed 43% plasma cells. He received CyBorDfor cycle 1 in May 2018 and was subsequently switched to RVD. He received 4 cycles followed by a repeat bone marrow biopsy in August 2018 which showed no increase in plasma cells.  2.He underwent autologous stem cell transplantation on 01/30/2017. He was started on Revlimid maintenance 10 mg daily in January 2019.  3.Labs from January February and March 2019 done at [facility] showed no M spike, normal kappa lambda ratio of 1.06. He was last seen by them in April 2019.  Following that patient was admitted to [facility] for healthcare associated pneumonia and was recently discharged. He wished to transfer his care to [place] at this time.  With regards to his myeloma patient is currently on 10 mg of Revlimid daily. He is also on acyclovir 400 mg twice daily which he is to continue for a minimum of 1 year post transplant. Patient has chronic back pain as well as  chemo-induced peripheral neuropathy for which he was seen by [name] in the past and is currently seeing [facility] pain clinic and is under pain contract with them  Interval history-reports that neuropathy in his hands is getting worse which makes it difficult for him to use his hands at times.  Reports some fatigue but denies other complaints  ECOG PS- 1 Pain scale- 3 Opioid associated constipation- no  Review of systems- Review of Systems  Constitutional: Negative for chills, fever, malaise/fatigue and weight loss.  HENT: Negative for congestion, ear discharge and nosebleeds.   Eyes: Negative for blurred vision.  Respiratory: Negative for cough, hemoptysis, sputum production, shortness of breath and wheezing.   Cardiovascular: Negative for chest pain, palpitations, orthopnea and claudication.  Gastrointestinal: Negative for abdominal pain, blood in stool, constipation, diarrhea, heartburn, melena, nausea and vomiting.  Genitourinary: Negative for dysuria, flank pain, frequency, hematuria and urgency.  Musculoskeletal: Negative for back pain, joint pain and myalgias.  Skin: Negative for rash.  Neurological: Positive for sensory change (Peripheral neuropathy). Negative for dizziness, tingling, focal weakness, seizures, weakness and headaches.  Endo/Heme/Allergies: Does not bruise/bleed easily.  Psychiatric/Behavioral: Negative for depression and suicidal ideas. The patient does not have insomnia.       No Known Allergies   Past Medical History:  Diagnosis Date  . CKD (chronic kidney disease) stage 3, GFR 30-59 ml/min (HCC)   . Hypertension   . Multiple myeloma ([place]) 08/11/2017  . Neuropathy   . Pneumonia      Past Surgical History:  Procedure Laterality Date  . ABDOMINAL SURGERY    . JOINT  REPLACEMENT     right knee  . KNEE SURGERY Left     Social History   Socioeconomic History  . Marital status: Married    Spouse name: [name]  . Number of children: 3  . Years  of education: Not on file  . Highest education level: Not on file  Occupational History  . Not on file  Social Needs  . Financial resource strain: Not hard at all  . Food insecurity:    Worry: Never true    Inability: Never true  . Transportation needs:    Medical: No    Non-medical: No  Tobacco Use  . Smoking status: Former Smoker    Last attempt to quit: 08/19/2013    Years since quitting: 4.8  . Smokeless tobacco: Current User    Types: Snuff  Substance and Sexual Activity  . Alcohol use: No  . Drug use: No  . Sexual activity: Yes  Lifestyle  . Physical activity:    Days per week: 7 days    Minutes per session: 30 min  . Stress: Only a little  Relationships  . Social connections:    Talks on phone: Once a week    Gets together: Once a week    Attends religious service: More than 4 times per year    Active member of club or organization: No    Attends meetings of clubs or organizations: Never    Relationship status: Married  . Intimate partner violence:    Fear of current or ex partner: No    Emotionally abused: No    Physically abused: No    Forced sexual activity: No  Other Topics Concern  . Not on file  Social History Narrative   Live in private residence with spouse and mother in law    Family History  Problem Relation Age of Onset  . Cancer Mother 54       Pancreatic  . COPD Mother   . Diabetes Mother   . Hyperlipidemia Mother   . Hypertension Mother   . Cancer Maternal Uncle        pancreatic  . Cancer Maternal Grandmother 9       colon  . COPD Maternal Grandmother   . Diabetes Maternal Grandmother   . Hypertension Maternal Grandmother      Current Outpatient Medications:  .  acyclovir (ZOVIRAX) 200 MG capsule, Take 200 mg by mouth 2 (two) times daily. , Disp: , Rfl:  .  albuterol (PROVENTIL HFA;VENTOLIN HFA) 108 (90 Base) MCG/ACT inhaler, Inhale 2 puffs into the lungs every 6 (six) hours as needed for wheezing or shortness of breath., Disp: 1  Inhaler, Rfl: 0 .  aspirin EC 81 MG tablet, Take 81 mg by mouth daily., Disp: , Rfl:  .  gabapentin (NEURONTIN) 600 MG tablet, Take 1 tablet (600 mg total) by mouth 3 (three) times daily., Disp: 90 tablet, Rfl: 2 .  lenalidomide (REVLIMID) 5 MG capsule, TAKE 1 CAPSULE BY MOUTH ONE TIME DAILY, Disp: 28 capsule, Rfl: 0 .  Melatonin 1 MG TABS, Take 1 tablet by mouth., Disp: , Rfl:  .  [START ON 07/03/2018] OxyCODONE HCl, Abuse Deter, (OXAYDO) 5 MG TABA, Take 2.5 mg by mouth 2 (two) times daily as needed for up to 30 days. Must last 30 days., Disp: 30 tablet, Rfl: 0 .  pantoprazole (PROTONIX) 40 MG tablet, Take 40 mg by mouth daily., Disp: , Rfl:  .  pregabalin (LYRICA) 150 MG capsule, Take 1  capsule (150 mg total) by mouth 3 (three) times daily., Disp: 90 capsule, Rfl: 2 .  baclofen (LIORESAL) 10 MG tablet, Take 1-2 tablets (10-20 mg total) by mouth 4 (four) times daily., Disp: 240 tablet, Rfl: 2  Physical exam:  Vitals:   06/12/18 1034  BP: 120/90  Pulse: (!) 102  Temp: 97.6 F (36.4 C)  TempSrc: Tympanic  Weight: 156 lb (70.8 kg)  Height: _0  (1.702 m)   Physical Exam Constitutional:      General: He is not in acute distress. HENT:     Head: Normocephalic and atraumatic.  Eyes:     Pupils: Pupils are equal, round, and reactive to light.  Neck:     Musculoskeletal: Normal range of motion.  Cardiovascular:     Rate and Rhythm: Normal rate and regular rhythm.     Heart sounds: Normal heart sounds.  Pulmonary:     Effort: Pulmonary effort is normal.     Breath sounds: Normal breath sounds.  Abdominal:     General: Bowel sounds are normal.     Palpations: Abdomen is soft.  Skin:    General: Skin is warm and dry.  Neurological:     Mental Status: He is alert and oriented to person, place, and time.      CMP Latest Ref Rng & Units 06/12/2018  Glucose 70 - 99 mg/dL 73  BUN 6 - 20 mg/dL 13  Creatinine 0.61 - 1.24 mg/dL 1.54(H)  Sodium 135 - 145 mmol/L 140  Potassium 3.5 - 5.1  mmol/L 4.1  Chloride 98 - 111 mmol/L 108  CO2 22 - 32 mmol/L 27  Calcium 8.9 - 10.3 mg/dL 8.6(L)  Total Protein 6.5 - 8.1 g/dL 6.6  Total Bilirubin 0.3 - 1.2 mg/dL 0.7  Alkaline Phos 38 - 126 U/L 64  AST 15 - 41 U/L 44(H)  ALT 0 - 44 U/L 38   CBC Latest Ref Rng & Units 06/12/2018  WBC 4.0 - 10.5 K/uL 2.6(L)  Hemoglobin 13.0 - 17.0 g/dL 12.5(L)  Hematocrit 39.0 - 52.0 % 36.7(L)  Platelets 150 - 400 K/uL 131(L)    No images are attached to the encounter.  Dg C-arm 1-60 Min-no Report  Result Date: 05/26/2018 Fluoroscopy was utilized by the requesting physician.  No radiographic interpretation.     Assessment and plan- Patient is a 48 y.o. male with kappa light chain multiple myeloma status post RVD chemotherapy followed by autologous stem cell transplantation in August 2018 currently in remission and on maintenance Revlimid.  He is here for routine follow-up of multiple myeloma  1.  Myeloma labs from today are pending however labs from December 2019 showed no evidence of M protein and normal free light chain ratio.  He remains in remission from his multiple myeloma.  He will continue to be on maintenance Revlimid.  His CBC today shows a white count of 2.6 with an ANC of 1.5.  H&H is stable.  He is on maintenance dose of Revlimid at 5 mg daily which he will continue to take at this time.  2.  Chemo-induced peripheral neuropathy: He is currently being managed at the pain clinic and we will touch base with them to see if we could consider adding Cymbalta for better control of his symptoms.  He is already on Neurontin oxycodone and Lyrica  3.  Patient will receive Xgeva today and receive Xgeva monthly.  I will plan to get a repeat skeletal survey at this time  Labs and  Xgeva in 1 month Labs: CBC with differential, CMP, myeloma panel, serum free light chain in 2 months.  See MD and gets Xgeva   Visit Diagnosis 1. Multiple myeloma in remission ([place])   2. High risk medication use   3.  Chemotherapy-induced peripheral neuropathy ([place])   4. Long term (current) use of bisphosphonates      Dr. Randa Evens, MD, MPH [facility] at [facility] [phone] 06/15/2018 1:39 PM

## 2018-06-15 NOTE — Progress Notes (Signed)
Patient's Name: James House.  MRN: [id number]  Referring Provider: Pleas Koch, NP  DOB: 1970/11/11  PCP: Pleas Koch, NP  DOS: 06/15/2018  Note by: Gaspar Cola, MD  Service setting: Ambulatory outpatient  Specialty: Interventional Pain Management  Location: [facility] (AMB) Pain Management Facility    Patient type: Established   Primary Reason(s) for Visit: Encounter for post-procedure evaluation of chronic illness with mild to moderate exacerbation CC: Hip Pain (bilateral) and Back Pain (low)  HPI  [name] is a 48 y.o. year old, male patient, who comes today for a post-procedure evaluation. He has Severe sepsis ([place]); Hyponatremia; Multiple myeloma ([place]); Syncope; CKD (chronic kidney disease), stage III ([place]); HTN (hypertension); Vitamin B 12 deficiency; Chronic low back pain (Primary Area of Pain) (Bilateral) (L>R) w/ sciatica (Bilateral); Chronic lower extremity pain (Secondary Area of Pain) (Bilateral) (L>R); Chronic knee pain (Tertiary Area of Pain) (Bilateral) (L>R); Chronic pain syndrome; Long term current use of opiate analgesic; Pharmacologic therapy; Disorder of skeletal system; Problems influencing health status; Chemotherapy-induced peripheral neuropathy ([place]); Neurogenic pain; Neuropathic pain; History of Partial knee replacement (Left); Chronic knee pain s/p partial replacement (Left); Elevated sedimentation rate; Hypocalcemia; Osteoarthritis of knee (Right); Chronic hip pain (Bilateral) (R>L); Chronic sacroiliac joint pain (Bilateral) (R>L); Other specified dorsopathies, sacral and sacrococcygeal region; Lumbar facet syndrome (Bilateral) (L>R); Spondylosis without myelopathy or radiculopathy, lumbar region; Chronic lumbar radiculopathy (by EMG/PNCV) (L5) (Right); Chronic low back pain (Primary Area of Pain) (Bilateral) (L>R) w/o sciatica; Spasm of back muscles; Chronic musculoskeletal pain; Need for prophylactic measure; Status post autologous bone marrow transplant  ([place]); Osteoarthritis of hips (Bilateral); and Piriformis syndrome (Left) on their problem list. His primarily concern today is the Hip Pain (bilateral) and Back Pain (low)  Pain Assessment: Location: Right, Left Hip Radiating: low back pain also Onset: More than a month ago Duration: Chronic pain Quality: Constant Severity: 6 /10 (subjective, self-reported pain score)  Note: Reported level is compatible with observation.                         When using our objective Pain Scale, levels between 6 and 10/10 are said to belong in an emergency room, as it progressively worsens from a 6/10, described as severely limiting, requiring emergency care not usually available at an outpatient pain management facility. At a 6/10 level, communication becomes difficult and requires great effort. Assistance to reach the emergency department may be required. Facial flushing and profuse sweating along with potentially dangerous increases in heart rate and blood pressure will be evident. Timing: Constant Modifying factors: procedures for short duration BP: 134/86  HR: 64  [name] comes in today for post-procedure evaluation.  Further details on both, my assessment(s), as well as the proposed treatment plan, please see below.  Post-Procedure Assessment  05/28/2018 Procedure: Diagnostic bilateral intra-articular hip joint injection #1 under fluoroscopic guidance, no sedation. Pre-procedure pain score:  6/10 Post-procedure pain score: 6/10 No initial benefit, possibly due to rapid discharge after no sedation procedure, without enough time to allow full onset of block. Influential Factors: BMI: 23.49 kg/m Intra-procedural challenges: None observed.         Assessment challenges: None detected.              Reported side-effects: None.        Post-procedural adverse reactions or complications: None reported         Sedation: No sedation used. When no sedatives are used, the  analgesic levels obtained are  directly associated to the effectiveness of the local anesthetics. However, when sedation is provided, the level of analgesia obtained during the initial 1 hour following the intervention, is believed to be the result of a combination of factors. These factors may include, but are not limited to: 1. The effectiveness of the local anesthetics used. 2. The effects of the analgesic(s) and/or anxiolytic(s) used. 3. The degree of discomfort experienced by the patient at the time of the procedure. 4. The patients ability and reliability in recalling and recording the events. 5. The presence and influence of possible secondary gains and/or psychosocial factors. Reported result: Relief experienced during the 1st hour after the procedure: 50 %(left 20%) (Ultra-Short Term Relief)            Interpretative annotation: Clinically appropriate result. No IV Analgesic or Anxiolytic given, therefore benefits are completely due to Local Anesthetic effects.          Effects of local anesthetic: The analgesic effects attained during this period are directly associated to the localized infiltration of local anesthetics and therefore cary significant diagnostic value as to the etiological location, or anatomical origin, of the pain. Expected duration of relief is directly dependent on the pharmacodynamics of the local anesthetic used. Long-acting (4-6 hours) anesthetics used.  Reported result: Relief during the next 4 to 6 hour after the procedure: 50 %(left 20%) (Short-Term Relief)            Interpretative annotation: Clinically appropriate result. Analgesia during this period is likely to be Local Anesthetic-related.          Long-term benefit: Defined as the period of time past the expected duration of local anesthetics (1 hour for short-acting and 4-6 hours for long-acting). With the possible exception of prolonged sympathetic blockade from the local anesthetics, benefits during this period are typically attributed to,  or associated with, other factors such as analgesic sensory neuropraxia, antiinflammatory effects, or beneficial biochemical changes provided by agents other than the local anesthetics.  Reported result: Extended relief following procedure: 50 %(left 0%) (Long-Term Relief)            Interpretative annotation: Clinically possible results. Good relief. No permanent benefit expected. Inflammation plays a part in the etiology to the pain.          Current benefits: Defined as reported results that persistent at this point in time.   Analgesia: 50 %            Function: Somewhat improved ROM: Somewhat improved Interpretative annotation: Recurrence of symptoms. No permanent benefit expected. Effective diagnostic intervention.          Interpretation: Results would suggest a successful diagnostic intervention. Based on this results, I would seem that 50% of his right hip pain is actually coming from the hip joint itself.  In the case of the left hip, he may be only 20%.  However neither side seems to have any inflammatory component to it.          Plan:  Please see "Plan of Care" for details.                Laboratory Chemistry  Inflammation Markers (CRP: Acute Phase) (ESR: Chronic Phase) Lab Results  Component Value Date   CRP 9 11/26/2017   ESRSEDRATE 16 (H) 11/26/2017   LATICACIDVEN 1.1 08/11/2017  Renal Markers Lab Results  Component Value Date   BUN 13 06/12/2018   CREATININE 1.54 (H) 06/12/2018   BCR 7 (L) 11/26/2017   GFRAA >60 06/12/2018   GFRNONAA 53 (L) 06/12/2018                             Hepatic Markers Lab Results  Component Value Date   AST 44 (H) 06/12/2018   ALT 38 06/12/2018   ALBUMIN 3.7 06/12/2018                        Note: Lab results reviewed.  Recent Imaging Results   Results for orders placed in visit on 05/26/18  DG C-Arm 1-60 Min-No Report   Narrative Fluoroscopy was utilized by the requesting physician.  No radiographic   interpretation.    Interpretation Report: Fluoroscopy was used during the procedure to assist with needle guidance. The images were interpreted intraoperatively by the requesting physician.  Meds   Current Outpatient Medications:  .  acyclovir (ZOVIRAX) 200 MG capsule, Take 200 mg by mouth 2 (two) times daily. , Disp: , Rfl:  .  albuterol (PROVENTIL HFA;VENTOLIN HFA) 108 (90 Base) MCG/ACT inhaler, Inhale 2 puffs into the lungs every 6 (six) hours as needed for wheezing or shortness of breath., Disp: 1 Inhaler, Rfl: 0 .  aspirin EC 81 MG tablet, Take 81 mg by mouth daily., Disp: , Rfl:  .  baclofen (LIORESAL) 10 MG tablet, Take 1-2 tablets (10-20 mg total) by mouth 4 (four) times daily., Disp: 240 tablet, Rfl: 2 .  gabapentin (NEURONTIN) 600 MG tablet, Take 1 tablet (600 mg total) by mouth 3 (three) times daily., Disp: 90 tablet, Rfl: 2 .  lenalidomide (REVLIMID) 5 MG capsule, TAKE 1 CAPSULE BY MOUTH ONE TIME DAILY, Disp: 28 capsule, Rfl: 0 .  Melatonin 1 MG TABS, Take 1 tablet by mouth., Disp: , Rfl:  .  [START ON 07/03/2018] OxyCODONE HCl, Abuse Deter, (OXAYDO) 5 MG TABA, Take 2.5 mg by mouth 2 (two) times daily as needed for up to 30 days. Must last 30 days., Disp: 30 tablet, Rfl: 0 .  pantoprazole (PROTONIX) 40 MG tablet, Take 40 mg by mouth daily., Disp: , Rfl:  .  pregabalin (LYRICA) 150 MG capsule, Take 1 capsule (150 mg total) by mouth 3 (three) times daily., Disp: 90 capsule, Rfl: 2  ROS  Constitutional: Denies any fever or chills Gastrointestinal: No reported hemesis, hematochezia, vomiting, or acute GI distress Musculoskeletal: Denies any acute onset joint swelling, redness, loss of ROM, or weakness Neurological: No reported episodes of acute onset apraxia, aphasia, dysarthria, agnosia, amnesia, paralysis, loss of coordination, or loss of consciousness  Allergies  Mr. Reisz has No Known Allergies.  PFSH  Drug: Mr. Scorsone  reports no history of drug use. Alcohol:  reports no  history of alcohol use. Tobacco:  reports that he quit smoking about 4 years ago. His smokeless tobacco use includes snuff. Medical:  has a past medical history of CKD (chronic kidney disease) stage 3, GFR 30-59 ml/min ([place]), Hypertension, Multiple myeloma ([place]) (08/11/2017), Neuropathy, and Pneumonia. Surgical: Mr. Pinheiro  has a past surgical history that includes Joint replacement; Knee surgery (Left); and Abdominal surgery. Family: family history includes COPD in his maternal grandmother and mother; Cancer in his maternal uncle; Cancer (age of onset: 30) in his maternal grandmother; Cancer (age of onset: 78) in his mother; Diabetes in his maternal  grandmother and mother; Hyperlipidemia in his mother; Hypertension in his maternal grandmother and mother.  Constitutional Exam  General appearance: Well nourished, well developed, and well hydrated. In no apparent acute distress Vitals:   06/15/18 0909  BP: 134/86  Pulse: 64  Resp: 18  Temp: 97.7 F (36.5 C)  TempSrc: Oral  SpO2: 100%  Weight: 150 lb (68 kg)  Height: _0  (1.702 m)   BMI Assessment: Estimated body mass index is 23.49 kg/m as calculated from the following:   Height as of this encounter: _1  (1.702 m).   Weight as of this encounter: 150 lb (68 kg).  BMI interpretation table: BMI level Category Range association with higher incidence of chronic pain  <18 kg/m2 Underweight   18.5-24.9 kg/m2 Ideal body weight   25-29.9 kg/m2 Overweight Increased incidence by 20%  30-34.9 kg/m2 Obese (Class I) Increased incidence by 68%  35-39.9 kg/m2 Severe obesity (Class II) Increased incidence by 136%  >40 kg/m2 Extreme obesity (Class III) Increased incidence by 254%   Patient's current BMI Ideal Body weight  Body mass index is 23.49 kg/m. Ideal body weight: 66.1 kg (145 lb 11.6 oz) Adjusted ideal body weight: 66.9 kg (147 lb 7 oz)   BMI Readings from Last 4 Encounters:  06/15/18 23.49 kg/m  06/12/18 24.43 kg/m  05/26/18 23.49  kg/m  05/04/18 23.49 kg/m   Wt Readings from Last 4 Encounters:  06/15/18 150 lb (68 kg)  06/12/18 156 lb (70.8 kg)  05/26/18 150 lb (68 kg)  05/04/18 150 lb (68 kg)  Psych/Mental status: Alert, oriented x 3 (person, place, & time)       Eyes: PERLA Respiratory: No evidence of acute respiratory distress  Cervical Spine Area Exam  Skin & Axial Inspection: No masses, redness, edema, swelling, or associated skin lesions Alignment: Symmetrical Functional ROM: Unrestricted ROM      Stability: No instability detected Muscle Tone/Strength: Functionally intact. No obvious neuro-muscular anomalies detected. Sensory (Neurological): Unimpaired Palpation: No palpable anomalies              Upper Extremity (UE) Exam    Side: Right upper extremity  Side: Left upper extremity  Skin & Extremity Inspection: Skin color, temperature, and hair growth are WNL. No peripheral edema or cyanosis. No masses, redness, swelling, asymmetry, or associated skin lesions. No contractures.  Skin & Extremity Inspection: Skin color, temperature, and hair growth are WNL. No peripheral edema or cyanosis. No masses, redness, swelling, asymmetry, or associated skin lesions. No contractures.  Functional ROM: Unrestricted ROM          Functional ROM: Unrestricted ROM          Muscle Tone/Strength: Functionally intact. No obvious neuro-muscular anomalies detected.  Muscle Tone/Strength: Functionally intact. No obvious neuro-muscular anomalies detected.  Sensory (Neurological): Unimpaired          Sensory (Neurological): Unimpaired          Palpation: No palpable anomalies              Palpation: No palpable anomalies              Provocative Test(s):  Phalen's test: deferred Tinel's test: deferred Apley's scratch test (touch opposite shoulder):  Action 1 (Across chest): deferred Action 2 (Overhead): deferred Action 3 (LB reach): deferred   Provocative Test(s):  Phalen's test: deferred Tinel's test: deferred Apley's  scratch test (touch opposite shoulder):  Action 1 (Across chest): deferred Action 2 (Overhead): deferred Action 3 (LB reach): deferred  Thoracic Spine Area Exam  Skin & Axial Inspection: No masses, redness, or swelling Alignment: Symmetrical Functional ROM: Unrestricted ROM Stability: No instability detected Muscle Tone/Strength: Functionally intact. No obvious neuro-muscular anomalies detected. Sensory (Neurological): Unimpaired Muscle strength & Tone: No palpable anomalies  Lumbar Spine Area Exam  Skin & Axial Inspection: No masses, redness, or swelling Alignment: Symmetrical Functional ROM: Unrestricted ROM       Stability: No instability detected Muscle Tone/Strength: Functionally intact. No obvious neuro-muscular anomalies detected. Sensory (Neurological): Unimpaired Palpation: No palpable anomalies       Provocative Tests: Hyperextension/rotation test: deferred today       Lumbar quadrant test (Kemp's test): deferred today       Lateral bending test: deferred today       Patrick's Maneuver: deferred today                   FABER* test: deferred today                   S-I anterior distraction/compression test: deferred today         S-I lateral compression test: deferred today         S-I Thigh-thrust test: deferred today         S-I Gaenslen's test: deferred today         *(Flexion, ABduction and External Rotation)  Gait & Posture Assessment  Ambulation: Unassisted Gait: Relatively normal for age and body habitus Posture: WNL   Lower Extremity Exam    Side: Right lower extremity  Side: Left lower extremity  Stability: No instability observed          Stability: No instability observed          Skin & Extremity Inspection: Skin color, temperature, and hair growth are WNL. No peripheral edema or cyanosis. No masses, redness, swelling, asymmetry, or associated skin lesions. No contractures.  Skin & Extremity Inspection: Skin color, temperature, and hair growth are  WNL. No peripheral edema or cyanosis. No masses, redness, swelling, asymmetry, or associated skin lesions. No contractures.  Functional ROM: Unrestricted ROM                  Functional ROM: Unrestricted ROM                  Muscle Tone/Strength: Functionally intact. No obvious neuro-muscular anomalies detected.  Muscle Tone/Strength: Functionally intact. No obvious neuro-muscular anomalies detected.  Sensory (Neurological): Unimpaired        Sensory (Neurological): Unimpaired        DTR: Patellar: deferred today Achilles: deferred today Plantar: deferred today  DTR: Patellar: deferred today Achilles: deferred today Plantar: deferred today  Palpation: No palpable anomalies  Palpation: Exquisite tenderness to palpation over the left piriformis muscle with exact reproduction of the patient's pain.   Assessment   Status Diagnosis  Controlled Controlled Controlled 1. Chronic low back pain (Primary Area of Pain) (Bilateral) (L>R) w/o sciatica   2. Chronic hip pain (Bilateral) (R>L)   3. Multiple myeloma in remission ([place])   4. Piriformis syndrome (Left)   5. Spasm of back muscles   6. Chronic musculoskeletal pain      Updated Problems: Problem  Piriformis syndrome (Left)    Plan of Care  Pharmacotherapy (Medications Ordered): Meds ordered this encounter  Medications  . baclofen (LIORESAL) 10 MG tablet    Sig: Take 1-2 tablets (10-20 mg total) by mouth 4 (four) times daily.    Dispense:  240 tablet  Refill:  2    Do not place this medication, or any other prescription from our practice, on "Automatic Refill". Patient may have prescription filled one day early if pharmacy is closed on scheduled refill date.   Medications administered today: [name] had no medications administered during this visit.   Procedure Orders     TRIGGER POINT INJECTION Lab Orders  No laboratory test(s) ordered today   Imaging Orders  No imaging studies ordered today    Referral  Orders     Ambulatory referral to Physical Therapy Interventional management options: Planned, scheduled, and/or pending:   Diagnostic left piriformis muscle injection #1 under fluoroscopic guidance, no sedation   Considering:   DiagnosticbilateralLESI Diagnosticbilateral lumbar facet block Possible bilateral lumbar facet RFA Diagnostic bilateral sacroiliac joint block Possible bilateral sacroiliac joint RFA Diagnostic right knee Hyalgan series Diagnosticright knee intra-articular knee injection Diagnostic left knee genicular nerve block   Palliative PRN treatment(s):   None at this time   Provider-requested follow-up: Return for Procedure (no sedation): (L) Piriformis MNB #1.  Future Appointments  Date Time Provider [place]  06/18/2018 11:30 AM [facility] IN[facility] None  07/13/2018 10:15 AM [facility] LAB [facility] None  07/13/2018 10:45 AM [facility] None  07/28/2018  9:00 AM Vevelyn Francois, NP [facility] None  08/14/2018  9:45 AM [facility] None  08/14/2018 10:15 AM Sindy Guadeloupe, MD [facility] None  08/14/2018 10:45 AM [facility] None   Primary Care Physician: Pleas Koch, NP Location[facility] Note by: Gaspar Cola, MD Date: 06/15/2018; Time: 10:35 AM

## 2018-06-15 NOTE — Patient Instructions (Addendum)
______________________________________________________________________________________________  Specialty Pain Scale  Introduction:  There are significant differences in how pain is reported. The word pain usually refers to physical pain, but it is also a common synonym of suffering. The medical community uses a scale from 0 (zero) to 10 (ten) to report pain level. Zero (0) is described as "no pain", while ten (10) is described as "the worse pain you can imagine". The problem with this scale is that physical pain is reported along with suffering. Suffering refers to mental pain, or more often yet it refers to any unpleasant feeling, emotion or aversion associated with the perception of harm or threat of harm. It is the psychological component of pain.  Pain Specialists prefer to separate the two components. The pain scale used by this practice is the Verbal Numerical Rating Scale (VNRS-11). This scale is for the physical pain only. DO NOT INCLUDE how your pain psychologically affects you. This scale is for adults 21 years of age and older. It has 11 (eleven) levels. The 1st level is 0/10. This means: "right now, I have no pain". In the context of pain management, it also means: "right now, my physical pain is under control with the current therapy".  General Information:  The scale should reflect your current level of pain. Unless you are specifically asked for the level of your worst pain, or your average pain. If you are asked for one of these two, then it should be understood that it is over the past 24 hours.  Levels 1 (one) through 5 (five) are described below, and can be treated as an outpatient. Ambulatory pain management facilities such as ours are more than adequate to treat these levels. Levels 6 (six) through 10 (ten) are also described below, however, these must be treated as a hospitalized patient. While levels 6 (six) and 7 (seven) may be evaluated at an urgent care facility, levels 8  (eight) through 10 (ten) constitute medical emergencies and as such, they belong in a hospital's emergency department. When having these levels (as described below), do not come to our office. Our facility is not equipped to manage these levels. Go directly to an urgent care facility or an emergency department to be evaluated.  Definitions:  Activities of Daily Living (ADL): Activities of daily living (ADL or ADLs) is a term used in healthcare to refer to people's daily self-care activities. Health professionals often use a person's ability or inability to perform ADLs as a measurement of their functional status, particularly in regard to people post injury, with disabilities and the elderly. There are two ADL levels: Basic and Instrumental. Basic Activities of Daily Living (BADL  or BADLs) consist of self-care tasks that include: Bathing and showering; personal hygiene and grooming (including brushing/combing/styling hair); dressing; Toilet hygiene (getting to the toilet, cleaning oneself, and getting back up); eating and self-feeding (not including cooking or chewing and swallowing); functional mobility, often referred to as "transferring", as measured by the ability to walk, get in and out of bed, and get into and out of a chair; the broader definition (moving from one place to another while performing activities) is useful for people with different physical abilities who are still able to get around independently. Basic ADLs include the things many people do when they get up in the morning and get ready to go out of the house: get out of bed, go to the toilet, bathe, dress, groom, and eat. On the average, loss of function typically follows a particular order.   Hygiene is the first to go, followed by loss of toilet use and locomotion. The last to go is the ability to eat. When there is only one remaining area in which the person is independent, there is a 62.9% chance that it is eating and only a 3.5% chance  that it is hygiene. Instrumental Activities of Daily Living (IADL or IADLs) are not necessary for fundamental functioning, but they let an individual live independently in a community. IADL consist of tasks that include: cleaning and maintaining the house; home establishment and maintenance; care of others (including selecting and supervising caregivers); care of pets; child rearing; managing money; managing financials (investments, etc.); meal preparation and cleanup; shopping for groceries and necessities; moving within the community; safety procedures and emergency responses; health management and maintenance (taking prescribed medications); and using the telephone or other form of communication.  Instructions:  Most patients tend to report their pain as a combination of two factors, their physical pain and their psychosocial pain. This last one is also known as "suffering" and it is reflection of how physical pain affects you socially and psychologically. From now on, report them separately.  From this point on, when asked to report your pain level, report only your physical pain. Use the following table for reference.  Pain Clinic Pain Levels (0-5/10)  Pain Level Score  Description  No Pain 0   Mild pain 1 Nagging, annoying, but does not interfere with basic activities of daily living (ADL). Patients are able to eat, bathe, get dressed, toileting (being able to get on and off the toilet and perform personal hygiene functions), transfer (move in and out of bed or a chair without assistance), and maintain continence (able to control bladder and bowel functions). Blood pressure and heart rate are unaffected. A normal heart rate for a healthy adult ranges from 60 to 100 bpm (beats per minute).   Mild to moderate pain 2 Noticeable and distracting. Impossible to hide from other people. More frequent flare-ups. Still possible to adapt and function close to normal. It can be very annoying and may have  occasional stronger flare-ups. With discipline, patients may get used to it and adapt.   Moderate pain 3 Interferes significantly with activities of daily living (ADL). It becomes difficult to feed, bathe, get dressed, get on and off the toilet or to perform personal hygiene functions. Difficult to get in and out of bed or a chair without assistance. Very distracting. With effort, it can be ignored when deeply involved in activities.   Moderately severe pain 4 Impossible to ignore for more than a few minutes. With effort, patients may still be able to manage work or participate in some social activities. Very difficult to concentrate. Signs of autonomic nervous system discharge are evident: dilated pupils (mydriasis); mild sweating (diaphoresis); sleep interference. Heart rate becomes elevated (>115 bpm). Diastolic blood pressure (lower number) rises above 100 mmHg. Patients find relief in laying down and not moving.   Severe pain 5 Intense and extremely unpleasant. Associated with frowning face and frequent crying. Pain overwhelms the senses.  Ability to do any activity or maintain social relationships becomes significantly limited. Conversation becomes difficult. Pacing back and forth is common, as getting into a comfortable position is nearly impossible. Pain wakes you up from deep sleep. Physical signs will be obvious: pupillary dilation; increased sweating; goosebumps; brisk reflexes; cold, clammy hands and feet; nausea, vomiting or dry heaves; loss of appetite; significant sleep disturbance with inability to fall asleep or to   remain asleep. When persistent, significant weight loss is observed due to the complete loss of appetite and sleep deprivation.  Blood pressure and heart rate becomes significantly elevated. Caution: If elevated blood pressure triggers a pounding headache associated with blurred vision, then the patient should immediately seek attention at an urgent or emergency care unit, as  these may be signs of an impending stroke.    Emergency Department Pain Levels (6-10/10)  Emergency Room Pain 6 Severely limiting. Requires emergency care and should not be seen or managed at an outpatient pain management facility. Communication becomes difficult and requires great effort. Assistance to reach the emergency department may be required. Facial flushing and profuse sweating along with potentially dangerous increases in heart rate and blood pressure will be evident.   Distressing pain 7 Self-care is very difficult. Assistance is required to transport, or use restroom. Assistance to reach the emergency department will be required. Tasks requiring coordination, such as bathing and getting dressed become very difficult.   Disabling pain 8 Self-care is no longer possible. At this level, pain is disabling. The individual is unable to do even the most "basic" activities such as walking, eating, bathing, dressing, transferring to a bed, or toileting. Fine motor skills are lost. It is difficult to think clearly.   Incapacitating pain 9 Pain becomes incapacitating. Thought processing is no longer possible. Difficult to remember your own name. Control of movement and coordination are lost.   The worst pain imaginable 10 At this level, most patients pass out from pain. When this level is reached, collapse of the autonomic nervous system occurs, leading to a sudden drop in blood pressure and heart rate. This in turn results in a temporary and dramatic drop in blood flow to the brain, leading to a loss of consciousness. Fainting is one of the body's self defense mechanisms. Passing out puts the brain in a calmed state and causes it to shut down for a while, in order to begin the healing process.    Summary: 1. Refer to this scale when providing [place] with your pain level. 2. Be accurate and careful when reporting your pain level. This will help with your care. 3. Over-reporting your pain level will  lead to loss of credibility. 4. Even a level of 1/10 means that there is pain and will be treated at our facility. 5. High, inaccurate reporting will be documented as "Symptom Exaggeration", leading to loss of credibility and suspicions of possible secondary gains such as obtaining more narcotics, or wanting to appear disabled, for fraudulent reasons. 6. Only pain levels of 5 or below will be seen at our facility. 7. Pain levels of 6 and above will be sent to the Emergency Department and the appointment cancelled. ______________________________________________________________________________________________  ____________________________________________________________________________________________  Preparing for your procedure (without sedation)  Instructions: . Oral Intake: Do not eat or drink anything for at least 3 hours prior to your procedure. . Transportation: Unless otherwise stated by your physician, you may drive yourself after the procedure. . Blood Pressure Medicine: Take your blood pressure medicine with a sip of water the morning of the procedure. . Blood thinners: Notify our staff if you are taking any blood thinners. Depending on which one you take, there will be specific instructions on how and when to stop it. . Diabetics on insulin: Notify the staff so that you can be scheduled 1st case in the morning. If your diabetes requires high dose insulin, take only  of your normal insulin dose the morning of  the procedure and notify the staff that you have done so. . Preventing infections: Shower with an antibacterial soap the morning of your procedure.  . Build-up your immune system: Take 1000 mg of Vitamin C with every meal (3 times a day) the day prior to your procedure. Marland Kitchen Antibiotics: Inform the staff if you have a condition or reason that requires you to take antibiotics before dental procedures. . Pregnancy: If you are pregnant, call and cancel the procedure. . Sickness: If you  have a cold, fever, or any active infections, call and cancel the procedure. . Arrival: You must be in the facility at least 30 minutes prior to your scheduled procedure. . Children: Do not bring any children with you. . Dress appropriately: Bring dark clothing that you would not mind if they get stained. . Valuables: Do not bring any jewelry or valuables.  Procedure appointments are reserved for interventional treatments only. Marland Kitchen No Prescription Refills. . No medication changes will be discussed during procedure appointments. . No disability issues will be discussed.  Reasons to call and reschedule or cancel your procedure: (Following these recommendations will minimize the risk of a serious complication.) . Surgeries: Avoid having procedures within 2 weeks of any surgery. (Avoid for 2 weeks before or after any surgery). . Flu Shots: Avoid having procedures within 2 weeks of a flu shots or . (Avoid for 2 weeks before or after immunizations). . Barium: Avoid having a procedure within 7-10 days after having had a radiological study involving the use of radiological contrast. (Myelograms, Barium swallow or enema study). . Heart attacks: Avoid any elective procedures or surgeries for the initial 6 months after a "Myocardial Infarction" (Heart Attack). . Blood thinners: It is imperative that you stop these medications before procedures. Let us know if you if you take any blood thinner.  . Infection: Avoid procedures during or within two weeks of an infection (including chest colds or gastrointestinal problems). Symptoms associated with infections include: Localized redness, fever, chills, night sweats or profuse sweating, burning sensation when voiding, cough, congestion, stuffiness, runny nose, sore throat, diarrhea, nausea, vomiting, cold or Flu symptoms, recent or current infections. It is specially important if the infection is over the area that we intend to treat. Marland Kitchen Heart and lung problems:  Symptoms that may suggest an active cardiopulmonary problem include: cough, chest pain, breathing difficulties or shortness of breath, dizziness, ankle swelling, uncontrolled high or unusually low blood pressure, and/or palpitations. If you are experiencing any of these symptoms, cancel your procedure and contact your primary care physician for an evaluation.  Remember:  Regular Business hours are:  Monday to Thursday 8:00 AM to 4:00 PM  Provider's Schedule: Milinda Pointer, MD:  Procedure days: Tuesday and Thursday 7:30 AM to 4:00 PM  Gillis Santa, MD:  Procedure days: Monday and Wednesday 7:30 AM to 4:00 PM ____________________________________________________________________________________________

## 2018-06-16 LAB — MULTIPLE MYELOMA PANEL, SERUM
Albumin SerPl Elph-Mcnc: 3.5 g/dL (ref 2.9–4.4)
Albumin/Glob SerPl: 1.5 (ref 0.7–1.7)
Alpha 1: 0.2 g/dL (ref 0.0–0.4)
Alpha2 Glob SerPl Elph-Mcnc: 0.6 g/dL (ref 0.4–1.0)
B-Globulin SerPl Elph-Mcnc: 0.9 g/dL (ref 0.7–1.3)
Gamma Glob SerPl Elph-Mcnc: 0.8 g/dL (ref 0.4–1.8)
Globulin, Total: 2.4 g/dL (ref 2.2–3.9)
IGM (IMMUNOGLOBULIN M), SRM: 30 mg/dL (ref 20–172)
IgA: 228 mg/dL (ref 90–386)
IgG (Immunoglobin G), Serum: 904 mg/dL (ref 700–1600)
Total Protein ELP: 5.9 g/dL — ABNORMAL LOW (ref 6.0–8.5)

## 2018-06-18 ENCOUNTER — Telehealth: Payer: Self-pay | Admitting: Primary Care

## 2018-06-18 ENCOUNTER — Inpatient Hospital Stay: Payer: No Typology Code available for payment source

## 2018-06-18 ENCOUNTER — Telehealth: Payer: Self-pay

## 2018-06-18 DIAGNOSIS — Z299 Encounter for prophylactic measures, unspecified: Secondary | ICD-10-CM

## 2018-06-18 DIAGNOSIS — Z5112 Encounter for antineoplastic immunotherapy: Secondary | ICD-10-CM | POA: Diagnosis not present

## 2018-06-18 MED ORDER — PNEUMOCOCCAL 13-VAL CONJ VACC IM SUSP
0.5000 mL | Freq: Once | INTRAMUSCULAR | Status: AC
  Administered 2018-06-18: 0.5 mL via INTRAMUSCULAR
  Filled 2018-06-18: qty 0.5

## 2018-06-18 MED ORDER — TETANUS-DIPHTH-ACELL PERTUSSIS 5-2.5-18.5 LF-MCG/0.5 IM SUSP
0.5000 mL | Freq: Once | INTRAMUSCULAR | Status: AC
  Administered 2018-06-18: 0.5 mL via INTRAMUSCULAR
  Filled 2018-06-18: qty 0.5

## 2018-06-18 NOTE — Telephone Encounter (Signed)
Noted  . Will wait for the fax

## 2018-06-18 NOTE — Telephone Encounter (Signed)
Best number [phone]  Pt called wanting to know if you can give him his immunizations here at office  He received his tdap pneumonia shot today @ [facility]   He wasn't for exactly what else he needed  He mention the flu shot.

## 2018-06-18 NOTE — Telephone Encounter (Signed)
Sherry with [facility] said pt had spoken with Vallarie Mare about getting immunizations at [facility] that the [facility] does not have. Judeen Hammans will fax immunizations needed per Dukes request to [phone]. Sherry request cb to see if pt can get immunizations at [facility]; the health dept is not in network for pt and very expensive.FYI to [place].

## 2018-06-18 NOTE — Telephone Encounter (Signed)
Spoken to patient and notified that it will not be a problem for him to come him to his immunization. I have asked him to as the cancer doctor's office to what exactly he may still need. Patient verbalized understanding. Will call back.

## 2018-06-19 ENCOUNTER — Encounter: Payer: Self-pay | Admitting: *Deleted

## 2018-06-19 ENCOUNTER — Telehealth: Payer: Self-pay | Admitting: *Deleted

## 2018-06-19 MED ORDER — DULOXETINE HCL 30 MG PO CPEP: 30.0000 mg | ORAL_CAPSULE | ORAL | 0 refills | Status: DC

## 2018-06-19 NOTE — Telephone Encounter (Signed)
Received fax. Placed in [employer] for review.  Looks like he needs Hib, Polio, and Hep B.  I have already asked Larene Beach and we do not have Polio vaccine by itself

## 2018-06-19 NOTE — Telephone Encounter (Signed)
Hi Sherry,  Dr. Dossie Arbour is ok with other physicians prescribing whatever they believe the patient needs, as long as he is aware. Thanks for letting us know.

## 2018-06-19 NOTE — Telephone Encounter (Signed)
Fine to proceed with recommendations.  Notify patient that we do not have polio.

## 2018-06-19 NOTE — Telephone Encounter (Signed)
Thank you for the response. I will order the cymbalta.

## 2018-06-19 NOTE — Telephone Encounter (Signed)
Noted. Will call patient.

## 2018-06-22 NOTE — Telephone Encounter (Signed)
Spoken and notified patient of [facility] comments. Patient verbalized understanding.  I told him that we can do the Hib and Hep B but not Polio. He will call back and let us know.

## 2018-06-22 NOTE — Telephone Encounter (Signed)
Pt left v/m requesting cb from Chan CMA. 

## 2018-06-22 NOTE — Telephone Encounter (Signed)
Per DPR, left detail message of Kate Clark's comments for patient to call back 

## 2018-06-23 ENCOUNTER — Encounter: Payer: Self-pay | Admitting: *Deleted

## 2018-06-23 ENCOUNTER — Ambulatory Visit: Payer: No Typology Code available for payment source | Admitting: Pain Medicine

## 2018-06-23 DIAGNOSIS — M62838 Other muscle spasm: Secondary | ICD-10-CM | POA: Insufficient documentation

## 2018-06-23 DIAGNOSIS — M7918 Myalgia, other site: Secondary | ICD-10-CM | POA: Insufficient documentation

## 2018-06-23 NOTE — Progress Notes (Deleted)
Patient's Name: James House.  MRN: [id number]  Referring Provider: Milinda Pointer, MD  DOB: 1971/01/15  PCP: Pleas Koch, NP  DOS: 06/23/2018  Note by: Gaspar Cola, MD  Service setting: Ambulatory outpatient  Specialty: Interventional Pain Management  Patient type: Established  Location: [facility] (AMB) Pain Management Facility  Visit type: Interventional Procedure   Primary Reason for Visit: Interventional Pain Management Treatment. CC: No chief complaint on file.  Procedure:          Anesthesia, Analgesia, Anxiolysis:  Type: Trigger Point Injection (1-2 muscle groups) #1  CPT: 20552 Primary Purpose: Diagnostic Region: Posterior Gluteal Level: Sacral Target Area: Trigger Point Approach: Percutaneous, ipsilateral approach. Laterality: Left-Sided        Type: Local Anesthesia Indication(s): Analgesia         Local Anesthetic: Lidocaine 1-2% Route: Infiltration ([place]/IM) IV Access: Declined Sedation: Declined   Position: Prone   Indications: 1. Piriformis syndrome (Left)   2. Piriformis muscle pain (Left)   3. Spasm of piriformis muscle (Left)   4. Chronic musculoskeletal pain   5. Spasm of back muscles    Pain Score: Pre-procedure:  /10 Post-procedure:  /10  Pre-op Assessment:  James House is a 48 y.o. (year old), male patient, seen today for interventional treatment. He  has a past surgical history that includes Joint replacement; Knee surgery (Left); and Abdominal surgery. James House has a current medication list which includes the following prescription(s): acyclovir, albuterol, aspirin ec, baclofen, duloxetine, gabapentin, lenalidomide, melatonin, oxycodone hcl (abuse deter), pantoprazole, and pregabalin. His primarily concern today is the No chief complaint on file.  Initial Vital Signs:  Pulse/HCG Rate:    Temp:   Resp:   BP:   SpO2:    BMI: Estimated body mass index is 23.49 kg/m as calculated from the following:   Height as of 06/15/18: 5\' 7"  (1.702  m).   Weight as of 06/15/18: 150 lb (68 kg).  Risk Assessment: Allergies: Reviewed. He has No Known Allergies.  Allergy Precautions: None required Coagulopathies: Reviewed. None identified.  Blood-thinner therapy: None at this time Active Infection(s): Reviewed. None identified. James House is afebrile  Site Confirmation: James House was asked to confirm the procedure and laterality before marking the site Procedure checklist: Completed Consent: Before the procedure and under the influence of no sedative(s), amnesic(s), or anxiolytics, the patient was informed of the treatment options, risks and possible complications. To fulfill our ethical and legal obligations, as recommended by the American Medical Association's Code of Ethics, I have informed the patient of my clinical impression; the nature and purpose of the treatment or procedure; the risks, benefits, and possible complications of the intervention; the alternatives, including doing nothing; the risk(s) and benefit(s) of the alternative treatment(s) or procedure(s); and the risk(s) and benefit(s) of doing nothing. The patient was provided information about the general risks and possible complications associated with the procedure. These may include, but are not limited to: failure to achieve desired goals, infection, bleeding, organ or nerve damage, allergic reactions, paralysis, and death. In addition, the patient was informed of those risks and complications associated to the procedure, such as failure to decrease pain; infection; bleeding; organ or nerve damage with subsequent damage to sensory, motor, and/or autonomic systems, resulting in permanent pain, numbness, and/or weakness of one or several areas of the body; allergic reactions; (i.e.: anaphylactic reaction); and/or death. Furthermore, the patient was informed of those risks and complications associated with the medications. These include, but are not limited  to: allergic reactions  (i.e.: anaphylactic or anaphylactoid reaction(s)); adrenal axis suppression; blood sugar elevation that in diabetics may result in ketoacidosis or comma; water retention that in patients with history of congestive heart failure may result in shortness of breath, pulmonary edema, and decompensation with resultant heart failure; weight gain; swelling or edema; medication-induced neural toxicity; particulate matter embolism and blood vessel occlusion with resultant organ, and/or nervous system infarction; and/or aseptic necrosis of one or more joints. Finally, the patient was informed that Medicine is not an exact science; therefore, there is also the possibility of unforeseen or unpredictable risks and/or possible complications that may result in a catastrophic outcome. The patient indicated having understood very clearly. We have given the patient no guarantees and we have made no promises. Enough time was given to the patient to ask questions, all of which were answered to the patient's satisfaction. James House has indicated that he wanted to continue with the procedure. Attestation: I, the ordering provider, attest that I have discussed with the patient the benefits, risks, side-effects, alternatives, likelihood of achieving goals, and potential problems during recovery for the procedure that I have provided informed consent. Date  Time: {CHL [facility]-PA[facility] TIME CHOICES:[id number]}  Pre-Procedure Preparation:  Monitoring: As per clinic protocol. Respiration, ETCO2, SpO2, BP, heart rate and rhythm monitor placed and checked for adequate function Safety Precautions: Patient was assessed for positional comfort and pressure points before starting the procedure. Time-out: I initiated and conducted the "Time-out" before starting the procedure, as per protocol. The patient was asked to participate by confirming the accuracy of the "Time Out" information. Verification of the correct person, site, and procedure were  performed and confirmed by me, the nursing staff, and the patient. "Time-out" conducted as per Joint Commission's Universal Protocol (UP.01.01.01). Time:    Description of Procedure:          Area Prepped: Entire Posterior Gluteal Region Prepping solution: ChloraPrep (2% chlorhexidine gluconate and 70% isopropyl alcohol) Safety Precautions: Aspiration looking for blood return was conducted prior to all injections. At no point did we inject any substances, as a needle was being advanced. No attempts were made at seeking any paresthesias. Safe injection practices and needle disposal techniques used. Medications properly checked for expiration dates. SDV (single dose vial) medications used. Description of the Procedure: Protocol guidelines were followed. The patient was placed in position over the fluoroscopy table. The target area was identified and the area prepped in the usual manner. Skin & deeper tissues infiltrated with local anesthetic. Appropriate amount of time allowed to pass for local anesthetics to take effect. The procedure needles were then advanced to the target area. Proper needle placement secured. Negative aspiration confirmed. Solution injected in intermittent fashion, asking for systemic symptoms every 0.5cc of injectate. The needles were then removed and the area cleansed, making sure to leave some of the prepping solution back to take advantage of its long term bactericidal properties.  There were no vitals filed for this visit.  Start Time:   hrs. End Time:   hrs. Materials:  Needle(s) Type: Epidural needle Gauge: 20G Length: 3.5-in Medication(s): Please see orders for medications and dosing details.  Imaging Guidance:          Type of Imaging Technique: None used Indication(s): N/A Exposure Time: No patient exposure Contrast: None used. Fluoroscopic Guidance: N/A Ultrasound Guidance: N/A Interpretation: N/A  Antibiotic Prophylaxis:   Anti-infectives (From admission,  onward)   None     Indication(s): None identified  Post-operative Assessment:  Post-procedure Vital Signs:  Pulse/HCG Rate:    Temp:   Resp:   BP:   SpO2:    EBL: None  Complications: No immediate post-treatment complications observed by team, or reported by patient.  Note: The patient tolerated the entire procedure well. A repeat set of vitals were taken after the procedure and the patient was kept under observation following institutional policy, for this type of procedure. Post-procedural neurological assessment was performed, showing return to baseline, prior to discharge. The patient was provided with post-procedure discharge instructions, including a section on how to identify potential problems. Should any problems arise concerning this procedure, the patient was given instructions to immediately contact us, at any time, without hesitation. In any case, we plan to contact the patient by telephone for a follow-up status report regarding this interventional procedure.  Comments:  No additional relevant information.  Plan of Care   Imaging Orders  No imaging studies ordered today   Procedure Orders    No procedure(s) ordered today    Medications ordered for procedure: No orders of the defined types were placed in this encounter.  Medications administered: [name] had no medications administered during this visit.  See the medical record for exact dosing, route, and time of administration.  Disposition: Discharge home  Discharge Date & Time: 06/23/2018;   hrs.   Physician-requested Follow-up: No follow-ups on file.  Future Appointments  Date Time Provider [place]  06/23/2018  9:30 AM Milinda Pointer, MD [facility] None  06/30/2018  8:45 AM [facility] NURSE [facility]  07/13/2018 10:15 AM [facility] None  07/13/2018 10:45 AM [facility] INJECTION [facility] None  07/28/2018  9:00 AM Vevelyn Francois, NP [facility] None  08/14/2018  9:45 AM  [facility] None  08/14/2018 10:15 AM Sindy Guadeloupe, MD [facility] None  08/14/2018 10:45 AM [facility] INJECTION [facility] None   Primary Care Physician: Pleas Koch, NP Location: [facility] Note by: Gaspar Cola, MD Date: 06/23/2018; Time: 7:08 AM  Disclaimer:  Medicine is not an Chief Strategy Officer. The only guarantee in medicine is that nothing is guaranteed. It is important to note that the decision to proceed with this intervention was based on the information collected from the patient. The Data and conclusions were drawn from the patient's questionnaire, the interview, and the physical examination. Because the information was provided in large part by the patient, it cannot be guaranteed that it has not been purposely or unconsciously manipulated. Every effort has been made to obtain as much relevant data as possible for this evaluation. It is important to note that the conclusions that lead to this procedure are derived in large part from the available data. Always take into account that the treatment will also be dependent on availability of resources and existing treatment guidelines, considered by other Pain Management Practitioners as being common knowledge and practice, at the time of the intervention. For Medico-Legal purposes, it is also important to point out that variation in procedural techniques and pharmacological choices are the acceptable norm. The indications, contraindications, technique, and results of the above procedure should only be interpreted and judged by a Board-Certified Interventional Pain Specialist with extensive familiarity and expertise in the same exact procedure and technique.

## 2018-06-30 ENCOUNTER — Ambulatory Visit: Payer: No Typology Code available for payment source

## 2018-07-02 ENCOUNTER — Ambulatory Visit: Payer: No Typology Code available for payment source | Admitting: Pain Medicine

## 2018-07-02 NOTE — Progress Notes (Deleted)
Appointment rescheduled due to Epic Downtime.

## 2018-07-09 ENCOUNTER — Ambulatory Visit
Admission: RE | Admit: 2018-07-09 | Discharge: 2018-07-09 | Disposition: A | Discharge status: Home / Self Care | Payer: No Typology Code available for payment source | Source: Ambulatory Visit | Attending: Pain Medicine | Admitting: Pain Medicine

## 2018-07-09 ENCOUNTER — Encounter: Payer: Self-pay | Admitting: Pain Medicine

## 2018-07-09 ENCOUNTER — Ambulatory Visit (HOSPITAL_BASED_OUTPATIENT_CLINIC_OR_DEPARTMENT_OTHER): Payer: No Typology Code available for payment source | Admitting: Pain Medicine

## 2018-07-09 ENCOUNTER — Other Ambulatory Visit: Payer: Self-pay | Admitting: *Deleted

## 2018-07-09 ENCOUNTER — Other Ambulatory Visit: Payer: Self-pay

## 2018-07-09 VITALS — BP 112/91 | HR 79 | Temp 98.6°F | Resp 12 | Ht 67.0 in | Wt 150.0 lb

## 2018-07-09 DIAGNOSIS — G8929 Other chronic pain: Secondary | ICD-10-CM

## 2018-07-09 DIAGNOSIS — G5702 Lesion of sciatic nerve, left lower limb: Secondary | ICD-10-CM | POA: Insufficient documentation

## 2018-07-09 DIAGNOSIS — M47816 Spondylosis without myelopathy or radiculopathy, lumbar region: Secondary | ICD-10-CM | POA: Diagnosis present

## 2018-07-09 DIAGNOSIS — M7918 Myalgia, other site: Secondary | ICD-10-CM | POA: Diagnosis present

## 2018-07-09 DIAGNOSIS — M62838 Other muscle spasm: Secondary | ICD-10-CM | POA: Insufficient documentation

## 2018-07-09 DIAGNOSIS — C9001 Multiple myeloma in remission: Secondary | ICD-10-CM

## 2018-07-09 MED ORDER — LENALIDOMIDE 5 MG PO CAPS: ORAL_CAPSULE | ORAL | 0 refills | Status: DC

## 2018-07-09 MED ORDER — METHYLPREDNISOLONE ACETATE 80 MG/ML IJ SUSP
80.0000 mg | Freq: Once | INTRAMUSCULAR | Status: AC
  Administered 2018-07-09: 80 mg via INTRA_ARTICULAR
  Filled 2018-07-09: qty 1

## 2018-07-09 MED ORDER — ROPIVACAINE HCL 2 MG/ML IJ SOLN
4.0000 mL | Freq: Once | INTRAMUSCULAR | Status: AC
  Administered 2018-07-09: 4 mL
  Filled 2018-07-09: qty 10

## 2018-07-09 MED ORDER — LIDOCAINE HCL 2 % IJ SOLN
20.0000 mL | Freq: Once | INTRAMUSCULAR | Status: AC
  Administered 2018-07-09: 400 mg
  Filled 2018-07-09: qty 40

## 2018-07-09 NOTE — Patient Instructions (Addendum)
____________________________________________________________________________________________  Post-Procedure Discharge Instructions  Instructions:  Apply ice:   Purpose: This will minimize any swelling and discomfort after procedure.   When: Day of procedure, as soon as you get home.  How: Fill a plastic sandwich bag with crushed ice. Cover it with a small towel and apply to injection site.  How long: (15 min on, 15 min off) Apply for 15 minutes then remove x 15 minutes.  Repeat sequence on day of procedure, until you go to bed.  Apply heat:   Purpose: To treat any soreness and discomfort from the procedure.  When: Starting the next day after the procedure.  How: Apply heat to procedure site starting the day following the procedure.  How long: May continue to repeat daily, until discomfort goes away.  Food intake: Start with clear liquids (like water) and advance to regular food, as tolerated.   Physical activities: Keep activities to a minimum for the first 8 hours after the procedure. After that, then as tolerated.  Driving: If you have received any sedation, be responsible and do not drive. You are not allowed to drive for 24 hours after having sedation.  Blood thinner: (Applies only to those taking blood thinners) You may restart your blood thinner 6 hours after your procedure.  Insulin: (Applies only to Diabetic patients taking insulin) As soon as you can eat, you may resume your normal dosing schedule.  Infection prevention: Keep procedure site clean and dry. Shower daily and clean area with soap and water.  Post-procedure Pain Diary: Extremely important that this be done correctly and accurately. Recorded information will be used to determine the next step in treatment. For the purpose of accuracy, follow these rules:  Evaluate only the area treated. Do not report or include pain from an untreated area. For the purpose of this evaluation, ignore all other areas of pain,  except for the treated area.  After your procedure, avoid taking a long nap and attempting to complete the pain diary after you wake up. Instead, set your alarm clock to go off every hour, on the hour, for the initial 8 hours after the procedure. Document the duration of the numbing medicine, and the relief you are getting from it.  Do not go to sleep and attempt to complete it later. It will not be accurate. If you received sedation, it is likely that you were given a medication that may cause amnesia. Because of this, completing the diary at a later time may cause the information to be inaccurate. This information is needed to plan your care.  Follow-up appointment: Keep your post-procedure follow-up evaluation appointment after the procedure (usually 2 weeks for most procedures, 6 weeks for radiofrequencies). DO NOT FORGET to bring you pain diary with you.   Expect: (What should I expect to see with my procedure?)  From numbing medicine (AKA: Local Anesthetics): Numbness or decrease in pain. You may also experience some weakness, which if present, could last for the duration of the local anesthetic.  Onset: Full effect within 15 minutes of injected.  Duration: It will depend on the type of local anesthetic used. On the average, 1 to 8 hours.   From steroids (Applies only if steroids were used): Decrease in swelling or inflammation. Once inflammation is improved, relief of the pain will follow.  Onset of benefits: Depends on the amount of swelling present. The more swelling, the longer it will take for the benefits to be seen. In some cases, up to 10 days.    Duration: Steroids will stay in the system x 2 weeks. Duration of benefits will depend on multiple posibilities including persistent irritating factors.  Side-effects: If present, they may typically last 2 weeks (the duration of the steroids).  Frequent: Cramps (if they occur, drink Gatorade and take over-the-counter Magnesium 450-500 mg  once to twice a day); water retention with temporary weight gain; increases in blood sugar; decreased immune system response; increased appetite.  Occasional: Facial flushing (red, warm cheeks); mood swings; menstrual changes.  Uncommon: Long-term decrease or suppression of natural hormones; bone thinning. (These are more common with higher doses or more frequent use. This is why we prefer that our patients avoid having any injection therapies in other practices.)   Very Rare: Severe mood changes; psychosis; aseptic necrosis.  From procedure: Some discomfort is to be expected once the numbing medicine wears off. This should be minimal if ice and heat are applied as instructed.  Call if: (When should I call?)  You experience numbness and weakness that gets worse with time, as opposed to wearing off.  New onset bowel or bladder incontinence. (Applies only to procedures done in the spine)  Emergency Numbers:  Durning business hours (Monday - Thursday, 8:00 AM - 4:00 PM) (Friday, 9:00 AM - 12:00 Noon): [phone]  After hours: [phone]  NOTE: If you are having a problem and are unable connect with, or to talk to a provider, then go to your nearest urgent care or emergency department. If the problem is serious and urgent, please call 911. ____________________________________________________________________________________________   ____________________________________________________________________________________________  Preparing for Procedure with Sedation  Procedure appointments are limited to planned procedures: . No Prescription Refills. . No disability issues will be discussed. . No medication changes will be discussed.  Instructions: . Oral Intake: Do not eat or drink anything for at least 8 hours prior to your procedure. . Transportation: Public transportation is not allowed. Bring an adult driver. The driver must be physically present in our waiting room before  any procedure can be started. . Physical Assistance: Bring an adult physically capable of assisting you, in the event you need help. This adult should keep you company at home for at least 6 hours after the procedure. . Blood Pressure Medicine: Take your blood pressure medicine with a sip of water the morning of the procedure. . Blood thinners: Notify our staff if you are taking any blood thinners. Depending on which one you take, there will be specific instructions on how and when to stop it. . Diabetics on insulin: Notify the staff so that you can be scheduled 1st case in the morning. If your diabetes requires high dose insulin, take only  of your normal insulin dose the morning of the procedure and notify the staff that you have done so. . Preventing infections: Shower with an antibacterial soap the morning of your procedure. . Build-up your immune system: Take 1000 mg of Vitamin C with every meal (3 times a day) the day prior to your procedure. . Antibiotics: Inform the staff if you have a condition or reason that requires you to take antibiotics before dental procedures. . Pregnancy: If you are pregnant, call and cancel the procedure. . Sickness: If you have a cold, fever, or any active infections, call and cancel the procedure. . Arrival: You must be in the facility at least 30 minutes prior to your scheduled procedure. . Children: Do not bring children with you. . Dress appropriately: Bring dark clothing that you would not mind   if they get stained. . Valuables: Do not bring any jewelry or valuables.  Reasons to call and reschedule or cancel your procedure: (Following these recommendations will minimize the risk of a serious complication.) . Surgeries: Avoid having procedures within 2 weeks of any surgery. (Avoid for 2 weeks before or after any surgery). . Flu Shots: Avoid having procedures within 2 weeks of a flu shots or . (Avoid for 2 weeks before or after immunizations). . Barium: Avoid  having a procedure within 7-10 days after having had a radiological study involving the use of radiological contrast. (Myelograms, Barium swallow or enema study). . Heart attacks: Avoid any elective procedures or surgeries for the initial 6 months after a "Myocardial Infarction" (Heart Attack). . Blood thinners: It is imperative that you stop these medications before procedures. Let us know if you if you take any blood thinner.  . Infection: Avoid procedures during or within two weeks of an infection (including chest colds or gastrointestinal problems). Symptoms associated with infections include: Localized redness, fever, chills, night sweats or profuse sweating, burning sensation when voiding, cough, congestion, stuffiness, runny nose, sore throat, diarrhea, nausea, vomiting, cold or Flu symptoms, recent or current infections. It is specially important if the infection is over the area that we intend to treat. . Heart and lung problems: Symptoms that may suggest an active cardiopulmonary problem include: cough, chest pain, breathing difficulties or shortness of breath, dizziness, ankle swelling, uncontrolled high or unusually low blood pressure, and/or palpitations. If you are experiencing any of these symptoms, cancel your procedure and contact your primary care physician for an evaluation.  Remember:  Regular Business hours are:  Monday to Thursday 8:00 AM to 4:00 PM  Provider's Schedule: Verenice Westrich, MD:  Procedure days: Tuesday and Thursday 7:30 AM to 4:00 PM  Bilal Lateef, MD:  Procedure days: Monday and Wednesday 7:30 AM to 4:00 PM ____________________________________________________________________________________________   

## 2018-07-09 NOTE — Progress Notes (Signed)
Patient's Name: James House.  MRN: [id number]  Referring Provider: Milinda Pointer, MD  DOB: 08-07-1970  PCP: Pleas Koch, NP  DOS: 07/09/2018  Note by: Gaspar Cola, MD  Service setting: Ambulatory outpatient  Specialty: Interventional Pain Management  Patient type: Established  Location: [facility] (AMB) Pain Management Facility  Visit type: Interventional Procedure   Primary Reason for Visit: Interventional Pain Management Treatment. CC: Hip Pain (left) and Back Pain (low)  Procedure:          Anesthesia, Analgesia, Anxiolysis:  Type: Piriformis muscle trigger Point Injection (1-2 muscle groups) #1  CPT: 20552 Primary Purpose: Diagnostic Region: Posterior Gluteal Level: Sacral Target Area: Piriformis muscle trigger Point Approach: Percutaneous, ipsilateral approach. Laterality: Left        Type: Local Anesthesia Indication(s): Analgesia         Local Anesthetic: Lidocaine 1-2% Route: Infiltration ([place]/IM) IV Access: Declined Sedation: Declined   Position: Prone   Indications: 1. Piriformis syndrome (Left)   2. Piriformis muscle pain (Left)   3. Spasm of piriformis muscle (Left)    Pain Score: Pre-procedure: 6 /10 Post-procedure: 0-No pain/10  Pre-op Assessment:  James House is a 48 y.o. (year old), male patient, seen today for interventional treatment. He  has a past surgical history that includes Joint replacement; Knee surgery (Left); and Abdominal surgery. James House has a current medication list which includes the following prescription(s): acyclovir, albuterol, aspirin ec, baclofen, duloxetine, gabapentin, lenalidomide, melatonin, oxycodone hcl (abuse deter), pantoprazole, and pregabalin. His primarily concern today is the Hip Pain (left) and Back Pain (low)  Initial Vital Signs:  Pulse/HCG Rate: 79ECG Heart Rate: 77 Temp: 98.6 F (37 C) Resp: 18 BP: (!) 126/93 SpO2: 99 %  BMI: Estimated body mass index is 23.49 kg/m as calculated from the following:    Height as of this encounter: 5\' 7"  (1.702 m).   Weight as of this encounter: 150 lb (68 kg).  Risk Assessment: Allergies: Reviewed. He has No Known Allergies.  Allergy Precautions: None required Coagulopathies: Reviewed. None identified.  Blood-thinner therapy: None at this time Active Infection(s): Reviewed. None identified. James House is afebrile  Site Confirmation: James House was asked to confirm the procedure and laterality before marking the site Procedure checklist: Completed Consent: Before the procedure and under the influence of no sedative(s), amnesic(s), or anxiolytics, the patient was informed of the treatment options, risks and possible complications. To fulfill our ethical and legal obligations, as recommended by the American Medical Association's Code of Ethics, I have informed the patient of my clinical impression; the nature and purpose of the treatment or procedure; the risks, benefits, and possible complications of the intervention; the alternatives, including doing nothing; the risk(s) and benefit(s) of the alternative treatment(s) or procedure(s); and the risk(s) and benefit(s) of doing nothing. The patient was provided information about the general risks and possible complications associated with the procedure. These may include, but are not limited to: failure to achieve desired goals, infection, bleeding, organ or nerve damage, allergic reactions, paralysis, and death. In addition, the patient was informed of those risks and complications associated to the procedure, such as failure to decrease pain; infection; bleeding; organ or nerve damage with subsequent damage to sensory, motor, and/or autonomic systems, resulting in permanent pain, numbness, and/or weakness of one or several areas of the body; allergic reactions; (i.e.: anaphylactic reaction); and/or death. Furthermore, the patient was informed of those risks and complications associated with the medications. These include,  but are not limited  to: allergic reactions (i.e.: anaphylactic or anaphylactoid reaction(s)); adrenal axis suppression; blood sugar elevation that in diabetics may result in ketoacidosis or comma; water retention that in patients with history of congestive heart failure may result in shortness of breath, pulmonary edema, and decompensation with resultant heart failure; weight gain; swelling or edema; medication-induced neural toxicity; particulate matter embolism and blood vessel occlusion with resultant organ, and/or nervous system infarction; and/or aseptic necrosis of one or more joints. Finally, the patient was informed that Medicine is not an exact science; therefore, there is also the possibility of unforeseen or unpredictable risks and/or possible complications that may result in a catastrophic outcome. The patient indicated having understood very clearly. We have given the patient no guarantees and we have made no promises. Enough time was given to the patient to ask questions, all of which were answered to the patient's satisfaction. James House has indicated that he wanted to continue with the procedure. Attestation: I, the ordering provider, attest that I have discussed with the patient the benefits, risks, side-effects, alternatives, likelihood of achieving goals, and potential problems during recovery for the procedure that I have provided informed consent. Date  Time: 07/09/2018 12:56 PM  Pre-Procedure Preparation:  Monitoring: As per clinic protocol. Respiration, ETCO2, SpO2, BP, heart rate and rhythm monitor placed and checked for adequate function Safety Precautions: Patient was assessed for positional comfort and pressure points before starting the procedure. Time-out: I initiated and conducted the "Time-out" before starting the procedure, as per protocol. The patient was asked to participate by confirming the accuracy of the "Time Out" information. Verification of the correct person, site,  and procedure were performed and confirmed by me, the nursing staff, and the patient. "Time-out" conducted as per Joint Commission's Universal Protocol (UP.01.01.01). Time: 1325  Description of Procedure:          Area Prepped: Entire Posterior Gluteal Region Prepping solution: ChloraPrep (2% chlorhexidine gluconate and 70% isopropyl alcohol) Safety Precautions: Aspiration looking for blood return was conducted prior to all injections. At no point did we inject any substances, as a needle was being advanced. No attempts were made at seeking any paresthesias. Safe injection practices and needle disposal techniques used. Medications properly checked for expiration dates. SDV (single dose vial) medications used. Description of the Procedure: Protocol guidelines were followed. The patient was placed in position over the fluoroscopy table. The target area was identified and the area prepped in the usual manner. Skin & deeper tissues infiltrated with local anesthetic. Appropriate amount of time allowed to pass for local anesthetics to take effect. The procedure needles were then advanced to the target area. Proper needle placement secured. Negative aspiration confirmed. Solution injected in intermittent fashion, asking for systemic symptoms every 0.5cc of injectate. The needles were then removed and the area cleansed, making sure to leave some of the prepping solution back to take advantage of its long term bactericidal properties.  Vitals:   07/09/18 1323 07/09/18 1326 07/09/18 1331 07/09/18 1336  BP: 137/76 (!) 138/101 (!) 145/98 (!) 112/91  Pulse:      Resp: 14 17 20 12   Temp:      SpO2: 100% 100% 100% 100%  Weight:      Height:        Start Time: 1325 hrs. End Time: 1334 hrs. Materials:  Needle(s) Type: Epidural needle Gauge: 20G Length: 3.5-in Medication(s): Please see orders for medications and dosing details.  Imaging Guidance:          Type of Imaging  Technique: None  used Indication(s): N/A Exposure Time: No patient exposure Contrast: None used. Fluoroscopic Guidance: N/A Ultrasound Guidance: N/A Interpretation: N/A  Antibiotic Prophylaxis:   Anti-infectives (From admission, onward)   None     Indication(s): None identified  Post-operative Assessment:  Post-procedure Vital Signs:  Pulse/HCG Rate: 7979 Temp: 98.6 F (37 C) Resp: 12 BP: (!) 112/91 SpO2: 100 %  EBL: None  Complications: No immediate post-treatment complications observed by team, or reported by patient.  Note: The patient tolerated the entire procedure well. A repeat set of vitals were taken after the procedure and the patient was kept under observation following institutional policy, for this type of procedure. Post-procedural neurological assessment was performed, showing return to baseline, prior to discharge. The patient was provided with post-procedure discharge instructions, including a section on how to identify potential problems. Should any problems arise concerning this procedure, the patient was given instructions to immediately contact us, at any time, without hesitation. In any case, we plan to contact the patient by telephone for a follow-up status report regarding this interventional procedure.  Comments:  No additional relevant information.  Plan of Care  Orders:  Orders Placed This Encounter  Procedures  . TRIGGER POINT INJECTION    Area: Buttocks region (gluteal area) Indications: Piriformis muscle pain; Left (G57.02) piriformis-syndrome; piriformis muscle spasms (I09.735). CPT code: 20552    Scheduling Instructions:     Type: Myoneural block (TPI) of piriformis muscle.     Side: Left     Sedation: None     Timeframe: Today    Order Specific Question:   Where will this procedure be performed?    Answer:   [facility]  . LUMBAR FACET(MEDIAL BRANCH NERVE BLOCK) MBNB    Standing Status:   Future    Standing Expiration Date:   08/09/2018     Scheduling Instructions:     Side: Bilateral     Level: L3-4, L4-5, & L5-S1 Facets (L2, L3, L4, L5, & S1 Medial Branch Nerves)     Sedation: Patient's choice.     Timeframe: ASAA    Order Specific Question:   Where will this procedure be performed?    Answer:   [facility]  . DG C-Arm 1-60 Min-No Report    Intraoperative interpretation by procedural physician at [place].    Standing Status:   Standing    Number of Occurrences:   1    Order Specific Question:   Reason for exam:    Answer:   Assistance in needle guidance and placement for procedures requiring needle placement in or near specific anatomical locations not easily accessible without such assistance.  . Informed Consent Details: Transcribe to consent form and obtain patient signature    Consent Attestation: I, the ordering provider, attest that I have discussed with the patient the benefits, risks, side-effects, alternatives, likelihood of achieving goals, and potential problems during recovery for the procedure that I have provided informed consent.    Scheduling Instructions:     Procedure: Piriformis Block     Surgeon: Zell Doucette A. Dossie Arbour, MD     Indications: Buttocks pain 2ry to piriformis myofacial pain and/or syndrome  . Provider attestation of informed consent for procedure/surgical case    I, the ordering provider, attest that I have discussed with the patient the benefits, risks, side effects, alternatives, likelihood of achieving goals and potential problems during recovery for the procedure that I have provided informed consent.    Standing Status:  Standing    Number of Occurrences:   1   Medications ordered for procedure: Meds ordered this encounter  Medications  . lidocaine (XYLOCAINE) 2 % (with pres) injection 400 mg  . ropivacaine (PF) 2 mg/mL (0.2%) (NAROPIN) injection 4 mL  . methylPREDNISolone acetate (DEPO-MEDROL) injection 80 mg   Medications administered: We administered  lidocaine, ropivacaine (PF) 2 mg/mL (0.2%), and methylPREDNISolone acetate.  See the medical record for exact dosing, route, and time of administration.  Disposition: Discharge home  Discharge Date & Time: 07/09/2018; 1343 hrs.   Follow-up plan:   Return for Procedure + EPP (2 wks) (w/ sedation): (B) L-FCT BLK #2.     Future Appointments  Date Time Provider [place]  07/13/2018 10:15 AM [facility] None  07/13/2018 10:45 AM [facility] INJECTION [facility] None  07/28/2018  9:00 AM Vevelyn Francois, NP [facility] None  08/14/2018  9:45 AM [facility] None  08/14/2018 10:15 AM Sindy Guadeloupe, MD [facility] None  08/14/2018 10:45 AM [facility] INJE[facility] None   Primary Care Physician: Pleas Koch, NP Location: [facility] Note by: Gaspar Cola, MD Date: 07/09/2018; Time: 1:44 PM  Disclaimer:  Medicine is not an Chief Strategy Officer. The only guarantee in medicine is that nothing is guaranteed. It is important to note that the decision to proceed with this intervention was based on the information collected from the patient. The Data and conclusions were drawn from the patient's questionnaire, the interview, and the physical examination. Because the information was provided in large part by the patient, it cannot be guaranteed that it has not been purposely or unconsciously manipulated. Every effort has been made to obtain as much relevant data as possible for this evaluation. It is important to note that the conclusions that lead to this procedure are derived in large part from the available data. Always take into account that the treatment will also be dependent on availability of resources and existing treatment guidelines, considered by other Pain Management Practitioners as being common knowledge and practice, at the time of the intervention. For Medico-Legal purposes, it is also important to point out that variation in  procedural techniques and pharmacological choices are the acceptable norm. The indications, contraindications, technique, and results of the above procedure should only be interpreted and judged by a Board-Certified Interventional Pain Specialist with extensive familiarity and expertise in the same exact procedure and technique.

## 2018-07-10 ENCOUNTER — Telehealth: Payer: Self-pay

## 2018-07-10 ENCOUNTER — Other Ambulatory Visit: Payer: Self-pay

## 2018-07-10 NOTE — Telephone Encounter (Signed)
Post procedure phone call.  LM 

## 2018-07-12 ENCOUNTER — Other Ambulatory Visit: Payer: Self-pay

## 2018-07-13 ENCOUNTER — Other Ambulatory Visit: Payer: Self-pay

## 2018-07-13 ENCOUNTER — Inpatient Hospital Stay: Payer: No Typology Code available for payment source | Attending: Oncology

## 2018-07-13 ENCOUNTER — Inpatient Hospital Stay: Payer: No Typology Code available for payment source

## 2018-07-13 DIAGNOSIS — C9001 Multiple myeloma in remission: Secondary | ICD-10-CM

## 2018-07-13 DIAGNOSIS — Z299 Encounter for prophylactic measures, unspecified: Secondary | ICD-10-CM

## 2018-07-13 DIAGNOSIS — Z9481 Bone marrow transplant status: Secondary | ICD-10-CM

## 2018-07-13 LAB — CBC WITH DIFFERENTIAL/PLATELET
Abs Immature Granulocytes: 0.01 10*3/uL (ref 0.00–0.07)
BASOS ABS: 0 10*3/uL (ref 0.0–0.1)
Basophils Relative: 1 %
Eosinophils Absolute: 0.3 10*3/uL (ref 0.0–0.5)
Eosinophils Relative: 9 %
HCT: 37.5 % — ABNORMAL LOW (ref 39.0–52.0)
Hemoglobin: 13 g/dL (ref 13.0–17.0)
Immature Granulocytes: 0 %
Lymphocytes Relative: 16 %
Lymphs Abs: 0.6 10*3/uL — ABNORMAL LOW (ref 0.7–4.0)
MCH: 35 pg — ABNORMAL HIGH (ref 26.0–34.0)
MCHC: 34.7 g/dL (ref 30.0–36.0)
MCV: 101.1 fL — ABNORMAL HIGH (ref 80.0–100.0)
Monocytes Absolute: 0.6 10*3/uL (ref 0.1–1.0)
Monocytes Relative: 17 %
NRBC: 0 % (ref 0.0–0.2)
Neutro Abs: 1.9 10*3/uL (ref 1.7–7.7)
Neutrophils Relative %: 57 %
Platelets: 132 10*3/uL — ABNORMAL LOW (ref 150–400)
RBC: 3.71 MIL/uL — ABNORMAL LOW (ref 4.22–5.81)
RDW: 14.4 % (ref 11.5–15.5)
WBC: 3.4 10*3/uL — ABNORMAL LOW (ref 4.0–10.5)

## 2018-07-13 LAB — COMPREHENSIVE METABOLIC PANEL
ALT: 21 U/L (ref 0–44)
AST: 19 U/L (ref 15–41)
Albumin: 3.9 g/dL (ref 3.5–5.0)
Alkaline Phosphatase: 67 U/L (ref 38–126)
Anion gap: 5 (ref 5–15)
BUN: 21 mg/dL — ABNORMAL HIGH (ref 6–20)
CHLORIDE: 105 mmol/L (ref 98–111)
CO2: 29 mmol/L (ref 22–32)
Calcium: 8.8 mg/dL — ABNORMAL LOW (ref 8.9–10.3)
Creatinine, Ser: 1.61 mg/dL — ABNORMAL HIGH (ref 0.61–1.24)
GFR calc non Af Amer: 50 mL/min — ABNORMAL LOW (ref >60)
GFR, EST AFRICAN AMERICAN: 58 mL/min — AB (ref >60)
Glucose, Bld: 95 mg/dL (ref 70–99)
Potassium: 3.9 mmol/L (ref 3.5–5.1)
Sodium: 139 mmol/L (ref 135–145)
Total Bilirubin: 0.6 mg/dL (ref 0.3–1.2)
Total Protein: 7 g/dL (ref 6.5–8.1)

## 2018-07-13 MED ORDER — DENOSUMAB 120 MG/1.7ML ~~LOC~~ SOLN
120.0000 mg | SUBCUTANEOUS | Status: DC
  Administered 2018-07-13: 120 mg via SUBCUTANEOUS

## 2018-07-14 LAB — MULTIPLE MYELOMA PANEL, SERUM
ALBUMIN SERPL ELPH-MCNC: 3.3 g/dL (ref 2.9–4.4)
Albumin/Glob SerPl: 1.3 (ref 0.7–1.7)
Alpha 1: 0.2 g/dL (ref 0.0–0.4)
Alpha2 Glob SerPl Elph-Mcnc: 0.7 g/dL (ref 0.4–1.0)
B-Globulin SerPl Elph-Mcnc: 0.9 g/dL (ref 0.7–1.3)
Gamma Glob SerPl Elph-Mcnc: 0.9 g/dL (ref 0.4–1.8)
Globulin, Total: 2.6 g/dL (ref 2.2–3.9)
IGG (IMMUNOGLOBIN G), SERUM: 969 mg/dL (ref 700–1600)
IgA: 222 mg/dL (ref 90–386)
IgM (Immunoglobulin M), Srm: 29 mg/dL (ref 20–172)
Total Protein ELP: 5.9 g/dL — ABNORMAL LOW (ref 6.0–8.5)

## 2018-07-14 LAB — KAPPA/LAMBDA LIGHT CHAINS
Kappa free light chain: 28.1 mg/L — ABNORMAL HIGH (ref 3.3–19.4)
Kappa, lambda light chain ratio: 1.62 (ref 0.26–1.65)
Lambda free light chains: 17.3 mg/L (ref 5.7–26.3)

## 2018-07-20 ENCOUNTER — Ambulatory Visit: Payer: No Typology Code available for payment source | Attending: Nurse Practitioner | Admitting: Nurse Practitioner

## 2018-07-20 ENCOUNTER — Other Ambulatory Visit: Payer: Self-pay

## 2018-07-20 DIAGNOSIS — M160 Bilateral primary osteoarthritis of hip: Secondary | ICD-10-CM

## 2018-07-20 DIAGNOSIS — G894 Chronic pain syndrome: Secondary | ICD-10-CM

## 2018-07-20 DIAGNOSIS — M7918 Myalgia, other site: Secondary | ICD-10-CM | POA: Diagnosis not present

## 2018-07-20 DIAGNOSIS — M62830 Muscle spasm of back: Secondary | ICD-10-CM

## 2018-07-20 DIAGNOSIS — M47816 Spondylosis without myelopathy or radiculopathy, lumbar region: Secondary | ICD-10-CM

## 2018-07-20 DIAGNOSIS — M792 Neuralgia and neuritis, unspecified: Secondary | ICD-10-CM

## 2018-07-20 DIAGNOSIS — G8929 Other chronic pain: Secondary | ICD-10-CM

## 2018-07-20 NOTE — Progress Notes (Signed)
Virtual Visit via Telephone Note  I connected with James House. on 07/20/18 at  8:30 AM EDT by telephone and verified that I am speaking with the correct person using two identifiers.   I discussed the limitations, risks, security and privacy concerns of performing an evaluation and management service by telephone and the availability of in person appointments. I also discussed with the patient that there may be a patient responsible charge related to this service. The patient expressed understanding and agreed to proceed.   History of Present Illness:  He is having low back pain 7/10. He as been having this pain for few years. He denies any known injury. He has pain that goes down his legs into his calves. He denies any numbness or tingling. He has some weakness. He denies any recent falls or injuries.  He has had interventional therapy; lumbar facets nerve blocks that are effective for helping to manage his pain. He does not have any planned procedures. He has had PT in the past however it was not effective. He does continues to work full time. He denies any side effects of his current medications. He denies any concerns today.     Observations/Objective:48 year old male with Chronic low back and leg pain. Is stable on his current regimen without any side effects or new concerns.    Assessment and Plan:  Stable Lumbar spondylosis Stable chronic lower extremity pain Stable chronic musculoskeletal pain.    Follow Up Instructions: 3 months follow up for medication management    I discussed the assessment and treatment plan with the patient. The patient was provided an opportunity to ask questions and all were answered. The patient agreed with the plan and demonstrated an understanding of the instructions.   The patient was advised to call back or seek an in-person evaluation if the symptoms worsen or if the condition fails to improve as anticipated.  I provided 12 minutes of  non-face-to-face time during this encounter.   Dionisio David, NP

## 2018-07-20 NOTE — Patient Instructions (Signed)
____________________________________________________________________________________________  Medication Rules  Purpose: To inform patients, and their family members, of our rules and regulations.  Applies to: All patients receiving prescriptions (written or electronic).  Pharmacy of record: Pharmacy where electronic prescriptions will be sent. If written prescriptions are taken to a different pharmacy, please inform the nursing staff. The pharmacy listed in the electronic medical record should be the one where you would like electronic prescriptions to be sent.  Electronic prescriptions: In compliance with the [place] Strengthen Opioid Misuse Prevention (STOP) Act of 2017 (Session Law 2017-74/H243), effective April 22, 2018, all controlled substances must be electronically prescribed. Calling prescriptions to the pharmacy will cease to exist.  Prescription refills: Only during scheduled appointments. Applies to all prescriptions.  NOTE: The following applies primarily to controlled substances (Opioid* Pain Medications).   Patient's responsibilities: 1. Pain Pills: Bring all pain pills to every appointment (except for procedure appointments). 2. Pill Bottles: Bring pills in original pharmacy bottle. Always bring the newest bottle. Bring bottle, even if empty. 3. Medication refills: You are responsible for knowing and keeping track of what medications you take and those you need refilled. The day before your appointment: write a list of all prescriptions that need to be refilled. The day of the appointment: give the list to the admitting nurse. Prescriptions will be written only during appointments. No prescriptions will be written on procedure days. If you forget a medication: it will not be "Called in", "Faxed", or "electronically sent". You will need to get another appointment to get these prescribed. No early refills. Do not call asking to have your prescription filled  early. 4. Prescription Accuracy: You are responsible for carefully inspecting your prescriptions before leaving our office. Have the discharge nurse carefully go over each prescription with you, before taking them home. Make sure that your name is accurately spelled, that your address is correct. Check the name and dose of your medication to make sure it is accurate. Check the number of pills, and the written instructions to make sure they are clear and accurate. Make sure that you are given enough medication to last until your next medication refill appointment. 5. Taking Medication: Take medication as prescribed. When it comes to controlled substances, taking less pills or less frequently than prescribed is permitted and encouraged. Never take more pills than instructed. Never take medication more frequently than prescribed.  6. Inform other Doctors: Always inform, all of your healthcare providers, of all the medications you take. 7. Pain Medication from other Providers: You are not allowed to accept any additional pain medication from any other Doctor or Healthcare provider. There are two exceptions to this rule. (see below) In the event that you require additional pain medication, you are responsible for notifying us, as stated below. 8. Medication Agreement: You are responsible for carefully reading and following our Medication Agreement. This must be signed before receiving any prescriptions from our practice. Safely store a copy of your signed Agreement. Violations to the Agreement will result in no further prescriptions. (Additional copies of our Medication Agreement are available upon request.) 9. Laws, Rules, & Regulations: All patients are expected to follow all Federal and State Laws, Statutes, Rules, & Regulations. Ignorance of the Laws does not constitute a valid excuse. The use of any illegal substances is prohibited. 10. Adopted CDC guidelines & recommendations: Target dosing levels will be  at or below 60 MME/day. Use of benzodiazepines** is not recommended.  Exceptions: There are only two exceptions to the rule of not   receiving pain medications from other Healthcare Providers. 1. Exception #1 (Emergencies): In the event of an emergency (i.e.: accident requiring emergency care), you are allowed to receive additional pain medication. However, you are responsible for: As soon as you are able, call our office [phone], at any time of the day or night, and leave a message stating your name, the date and nature of the emergency, and the name and dose of the medication prescribed. In the event that your call is answered by a member of our staff, make sure to document and save the date, time, and the name of the person that took your information.  2. Exception #2 (Planned Surgery): In the event that you are scheduled by another doctor or dentist to have any type of surgery or procedure, you are allowed (for a period no longer than 30 days), to receive additional pain medication, for the acute post-op pain. However, in this case, you are responsible for picking up a copy of our "Post-op Pain Management for Surgeons" handout, and giving it to your surgeon or dentist. This document is available at our office, and does not require an appointment to obtain it. Simply go to our office during business hours (Monday-Thursday from 8:00 AM to 4:00 PM) (Friday 8:00 AM to 12:00 Noon) or if you have a scheduled appointment with us, prior to your surgery, and ask for it by name. In addition, you will need to provide us with your name, name of your surgeon, type of surgery, and date of procedure or surgery.  *Opioid medications include: morphine, codeine, oxycodone, oxymorphone, hydrocodone, hydromorphone, meperidine, tramadol, tapentadol, buprenorphine, fentanyl, methadone. **Benzodiazepine medications include: diazepam (Valium), alprazolam (Xanax), clonazepam (Klonopine), lorazepam (Ativan), clorazepate  (Tranxene), chlordiazepoxide (Librium), estazolam (Prosom), oxazepam (Serax), temazepam (Restoril), triazolam (Halcion) (Last updated: 06/19/2017) ____________________________________________________________________________________________    

## 2018-07-21 MED ORDER — BACLOFEN 10 MG PO TABS: 10.0000 mg | ORAL_TABLET | Freq: Four times a day (QID) | ORAL | 2 refills | Status: DC

## 2018-07-21 MED ORDER — OXYCODONE HCL 5 MG PO TABS: 2.5000 mg | ORAL_TABLET | Freq: Two times a day (BID) | ORAL | 0 refills | Status: DC | PRN

## 2018-07-21 MED ORDER — OXYCODONE HCL 5 MG PO TABS: 2.5000 mg | ORAL_TABLET | Freq: Two times a day (BID) | ORAL | 0 refills | Status: DC

## 2018-07-21 MED ORDER — PREGABALIN 150 MG PO CAPS: 150.0000 mg | ORAL_CAPSULE | Freq: Three times a day (TID) | ORAL | 2 refills | Status: DC

## 2018-07-21 MED ORDER — GABAPENTIN 600 MG PO TABS: 600.0000 mg | ORAL_TABLET | Freq: Three times a day (TID) | ORAL | 2 refills | Status: DC

## 2018-07-21 MED FILL — BACLOFEN 10 MG TABS: 10 | 30 days supply | Qty: 240 | Fill #0

## 2018-07-21 MED FILL — GABAPENTIN 600 MG TABLET: 600 | 30 days supply | Qty: 90 | Fill #0

## 2018-07-22 ENCOUNTER — Ambulatory Visit
Payer: No Typology Code available for payment source | Admitting: Student in an Organized Health Care Education/Training Program

## 2018-07-23 ENCOUNTER — Ambulatory Visit: Payer: No Typology Code available for payment source | Admitting: Pain Medicine

## 2018-07-27 ENCOUNTER — Telehealth: Payer: Self-pay

## 2018-07-27 NOTE — Telephone Encounter (Signed)
His insurance denied the lumbar facets and he wants to know if there is anything else we can try to get approved because his pain is starting to come back.

## 2018-07-27 NOTE — Telephone Encounter (Signed)
Message printed and given to DR Dossie Arbour

## 2018-07-28 ENCOUNTER — Encounter: Payer: No Typology Code available for payment source | Admitting: Nurse Practitioner

## 2018-07-29 MED FILL — oxyCODONE HCL 5 MG TABS: 5 | 30 days supply | Qty: 30 | Fill #0

## 2018-07-29 MED FILL — PREGABALIN 150 MG CAPS: 150 | 30 days supply | Qty: 90 | Fill #0

## 2018-08-09 ENCOUNTER — Other Ambulatory Visit: Payer: Self-pay | Admitting: *Deleted

## 2018-08-09 DIAGNOSIS — C9001 Multiple myeloma in remission: Secondary | ICD-10-CM

## 2018-08-10 ENCOUNTER — Encounter: Payer: Self-pay | Admitting: *Deleted

## 2018-08-11 ENCOUNTER — Other Ambulatory Visit: Payer: Self-pay

## 2018-08-12 ENCOUNTER — Inpatient Hospital Stay: Payer: No Typology Code available for payment source

## 2018-08-12 ENCOUNTER — Inpatient Hospital Stay: Payer: No Typology Code available for payment source | Attending: Oncology

## 2018-08-12 ENCOUNTER — Inpatient Hospital Stay: Payer: No Typology Code available for payment source | Admitting: Oncology

## 2018-08-12 ENCOUNTER — Other Ambulatory Visit: Payer: Self-pay

## 2018-08-12 DIAGNOSIS — G620 Drug-induced polyneuropathy: Secondary | ICD-10-CM | POA: Diagnosis not present

## 2018-08-12 DIAGNOSIS — Z299 Encounter for prophylactic measures, unspecified: Secondary | ICD-10-CM

## 2018-08-12 DIAGNOSIS — Z79899 Other long term (current) drug therapy: Secondary | ICD-10-CM | POA: Diagnosis not present

## 2018-08-12 DIAGNOSIS — I10 Essential (primary) hypertension: Secondary | ICD-10-CM | POA: Diagnosis not present

## 2018-08-12 DIAGNOSIS — N183 Chronic kidney disease, stage 3 (moderate): Secondary | ICD-10-CM | POA: Insufficient documentation

## 2018-08-12 DIAGNOSIS — Z87891 Personal history of nicotine dependence: Secondary | ICD-10-CM | POA: Insufficient documentation

## 2018-08-12 DIAGNOSIS — C9001 Multiple myeloma in remission: Secondary | ICD-10-CM

## 2018-08-12 DIAGNOSIS — T451X5A Adverse effect of antineoplastic and immunosuppressive drugs, initial encounter: Secondary | ICD-10-CM | POA: Insufficient documentation

## 2018-08-12 DIAGNOSIS — Z7982 Long term (current) use of aspirin: Secondary | ICD-10-CM | POA: Insufficient documentation

## 2018-08-12 DIAGNOSIS — Z9484 Stem cells transplant status: Secondary | ICD-10-CM | POA: Insufficient documentation

## 2018-08-12 DIAGNOSIS — Z7951 Long term (current) use of inhaled steroids: Secondary | ICD-10-CM | POA: Insufficient documentation

## 2018-08-12 DIAGNOSIS — Z9481 Bone marrow transplant status: Secondary | ICD-10-CM

## 2018-08-12 LAB — CBC WITH DIFFERENTIAL/PLATELET
Abs Immature Granulocytes: 0.01 10*3/uL (ref 0.00–0.07)
Basophils Absolute: 0.1 10*3/uL (ref 0.0–0.1)
Basophils Relative: 2 %
Eosinophils Absolute: 0.2 10*3/uL (ref 0.0–0.5)
Eosinophils Relative: 5 %
HCT: 40.1 % (ref 39.0–52.0)
Hemoglobin: 14.1 g/dL (ref 13.0–17.0)
Immature Granulocytes: 0 %
Lymphocytes Relative: 25 %
Lymphs Abs: 0.8 10*3/uL (ref 0.7–4.0)
MCH: 35 pg — ABNORMAL HIGH (ref 26.0–34.0)
MCHC: 35.2 g/dL (ref 30.0–36.0)
MCV: 99.5 fL (ref 80.0–100.0)
Monocytes Absolute: 0.5 10*3/uL (ref 0.1–1.0)
Monocytes Relative: 15 %
Neutro Abs: 1.7 10*3/uL (ref 1.7–7.7)
Neutrophils Relative %: 53 %
Platelets: 125 10*3/uL — ABNORMAL LOW (ref 150–400)
RBC: 4.03 MIL/uL — ABNORMAL LOW (ref 4.22–5.81)
RDW: 14 % (ref 11.5–15.5)
WBC: 3.2 10*3/uL — ABNORMAL LOW (ref 4.0–10.5)
nRBC: 0 % (ref 0.0–0.2)

## 2018-08-12 LAB — COMPREHENSIVE METABOLIC PANEL
ALT: 21 U/L (ref 0–44)
AST: 22 U/L (ref 15–41)
Albumin: 4 g/dL (ref 3.5–5.0)
Alkaline Phosphatase: 60 U/L (ref 38–126)
Anion gap: 7 (ref 5–15)
BUN: 20 mg/dL (ref 6–20)
CO2: 28 mmol/L (ref 22–32)
Calcium: 9.3 mg/dL (ref 8.9–10.3)
Chloride: 103 mmol/L (ref 98–111)
Creatinine, Ser: 1.72 mg/dL — ABNORMAL HIGH (ref 0.61–1.24)
GFR calc Af Amer: 54 mL/min — ABNORMAL LOW (ref >60)
GFR calc non Af Amer: 46 mL/min — ABNORMAL LOW (ref >60)
Glucose, Bld: 95 mg/dL (ref 70–99)
Potassium: 4 mmol/L (ref 3.5–5.1)
Sodium: 138 mmol/L (ref 135–145)
Total Bilirubin: 0.8 mg/dL (ref 0.3–1.2)
Total Protein: 7.2 g/dL (ref 6.5–8.1)

## 2018-08-12 MED ORDER — DENOSUMAB 120 MG/1.7ML ~~LOC~~ SOLN
120.0000 mg | SUBCUTANEOUS | Status: DC
  Administered 2018-08-12: 120 mg via SUBCUTANEOUS

## 2018-08-13 ENCOUNTER — Telehealth: Payer: Self-pay

## 2018-08-13 LAB — KAPPA/LAMBDA LIGHT CHAINS
Kappa free light chain: 22.5 mg/L — ABNORMAL HIGH (ref 3.3–19.4)
Kappa, lambda light chain ratio: 1.63 (ref 0.26–1.65)
Lambda free light chains: 13.8 mg/L (ref 5.7–26.3)

## 2018-08-13 NOTE — Telephone Encounter (Signed)
Please schedule for a virtual appointment.

## 2018-08-13 NOTE — Telephone Encounter (Signed)
Since he cannot get a procedure he wants to know if his pain medication can be increased or maybe try something else. He is in a lot of pain and what he is taking now is not working for the pain.

## 2018-08-14 ENCOUNTER — Other Ambulatory Visit: Payer: No Typology Code available for payment source

## 2018-08-14 ENCOUNTER — Encounter: Payer: Self-pay | Admitting: Pain Medicine

## 2018-08-14 ENCOUNTER — Ambulatory Visit: Payer: No Typology Code available for payment source | Admitting: Oncology

## 2018-08-14 ENCOUNTER — Inpatient Hospital Stay: Payer: No Typology Code available for payment source

## 2018-08-16 NOTE — Progress Notes (Deleted)
Delete.

## 2018-08-17 ENCOUNTER — Other Ambulatory Visit: Payer: Self-pay

## 2018-08-17 ENCOUNTER — Telehealth: Payer: Self-pay | Admitting: *Deleted

## 2018-08-17 ENCOUNTER — Encounter: Payer: Self-pay | Admitting: Pain Medicine

## 2018-08-17 ENCOUNTER — Other Ambulatory Visit: Payer: Self-pay | Admitting: Oncology

## 2018-08-17 ENCOUNTER — Ambulatory Visit: Payer: No Typology Code available for payment source | Attending: Pain Medicine | Admitting: Pain Medicine

## 2018-08-17 VITALS — Ht 67.0 in | Wt 145.0 lb

## 2018-08-17 DIAGNOSIS — G620 Drug-induced polyneuropathy: Secondary | ICD-10-CM

## 2018-08-17 DIAGNOSIS — M792 Neuralgia and neuritis, unspecified: Secondary | ICD-10-CM

## 2018-08-17 DIAGNOSIS — G894 Chronic pain syndrome: Secondary | ICD-10-CM | POA: Diagnosis not present

## 2018-08-17 DIAGNOSIS — M47816 Spondylosis without myelopathy or radiculopathy, lumbar region: Secondary | ICD-10-CM | POA: Diagnosis not present

## 2018-08-17 DIAGNOSIS — G8929 Other chronic pain: Secondary | ICD-10-CM

## 2018-08-17 DIAGNOSIS — M7918 Myalgia, other site: Secondary | ICD-10-CM

## 2018-08-17 DIAGNOSIS — T451X5A Adverse effect of antineoplastic and immunosuppressive drugs, initial encounter: Secondary | ICD-10-CM

## 2018-08-17 DIAGNOSIS — C9001 Multiple myeloma in remission: Secondary | ICD-10-CM

## 2018-08-17 DIAGNOSIS — C9000 Multiple myeloma not having achieved remission: Secondary | ICD-10-CM

## 2018-08-17 DIAGNOSIS — M62830 Muscle spasm of back: Secondary | ICD-10-CM

## 2018-08-17 LAB — MULTIPLE MYELOMA PANEL, SERUM
Albumin SerPl Elph-Mcnc: 3.9 g/dL (ref 2.9–4.4)
Albumin/Glob SerPl: 1.6 (ref 0.7–1.7)
Alpha 1: 0.1 g/dL (ref 0.0–0.4)
Alpha2 Glob SerPl Elph-Mcnc: 0.6 g/dL (ref 0.4–1.0)
B-Globulin SerPl Elph-Mcnc: 1.1 g/dL (ref 0.7–1.3)
Gamma Glob SerPl Elph-Mcnc: 0.8 g/dL (ref 0.4–1.8)
Globulin, Total: 2.6 g/dL (ref 2.2–3.9)
IgA: 240 mg/dL (ref 90–386)
IgG (Immunoglobin G), Serum: 963 mg/dL (ref 603–1613)
IgM (Immunoglobulin M), Srm: 36 mg/dL (ref 20–172)
Total Protein ELP: 6.5 g/dL (ref 6.0–8.5)

## 2018-08-17 MED ORDER — OXYCODONE HCL 5 MG PO TABS: 5.0000 mg | ORAL_TABLET | Freq: Three times a day (TID) | ORAL | 0 refills | Status: DC | PRN

## 2018-08-17 NOTE — Patient Instructions (Addendum)
____________________________________________________________________________________________  Preparing for Procedure with Sedation  Procedure appointments are limited to planned procedures: . No Prescription Refills. . No disability issues will be discussed. . No medication changes will be discussed.  Instructions: . Oral Intake: Do not eat or drink anything for at least 8 hours prior to your procedure. . Transportation: Public transportation is not allowed. Bring an adult driver. The driver must be physically present in our waiting room before any procedure can be started. . Physical Assistance: Bring an adult physically capable of assisting you, in the event you need help. This adult should keep you company at home for at least 6 hours after the procedure. . Blood Pressure Medicine: Take your blood pressure medicine with a sip of water the morning of the procedure. . Blood thinners: Notify our staff if you are taking any blood thinners. Depending on which one you take, there will be specific instructions on how and when to stop it. . Diabetics on insulin: Notify the staff so that you can be scheduled 1st case in the morning. If your diabetes requires high dose insulin, take only  of your normal insulin dose the morning of the procedure and notify the staff that you have done so. . Preventing infections: Shower with an antibacterial soap the morning of your procedure. . Build-up your immune system: Take 1000 mg of Vitamin C with every meal (3 times a day) the day prior to your procedure. . Antibiotics: Inform the staff if you have a condition or reason that requires you to take antibiotics before dental procedures. . Pregnancy: If you are pregnant, call and cancel the procedure. . Sickness: If you have a cold, fever, or any active infections, call and cancel the procedure. . Arrival: You must be in the facility at least 30 minutes prior to your scheduled procedure. . Children: Do not bring  children with you. . Dress appropriately: Bring dark clothing that you would not mind if they get stained. . Valuables: Do not bring any jewelry or valuables.  Reasons to call and reschedule or cancel your procedure: (Following these recommendations will minimize the risk of a serious complication.) . Surgeries: Avoid having procedures within 2 weeks of any surgery. (Avoid for 2 weeks before or after any surgery). . Flu Shots: Avoid having procedures within 2 weeks of a flu shots or . (Avoid for 2 weeks before or after immunizations). . Barium: Avoid having a procedure within 7-10 days after having had a radiological study involving the use of radiological contrast. (Myelograms, Barium swallow or enema study). . Heart attacks: Avoid any elective procedures or surgeries for the initial 6 months after a "Myocardial Infarction" (Heart Attack). . Blood thinners: It is imperative that you stop these medications before procedures. Let us know if you if you take any blood thinner.  . Infection: Avoid procedures during or within two weeks of an infection (including chest colds or gastrointestinal problems). Symptoms associated with infections include: Localized redness, fever, chills, night sweats or profuse sweating, burning sensation when voiding, cough, congestion, stuffiness, runny nose, sore throat, diarrhea, nausea, vomiting, cold or Flu symptoms, recent or current infections. It is specially important if the infection is over the area that we intend to treat. . Heart and lung problems: Symptoms that may suggest an active cardiopulmonary problem include: cough, chest pain, breathing difficulties or shortness of breath, dizziness, ankle swelling, uncontrolled high or unusually low blood pressure, and/or palpitations. If you are experiencing any of these symptoms, cancel your procedure and contact   your primary care physician for an evaluation.  Remember:  Regular Business hours are:  Monday to Thursday  8:00 AM to 4:00 PM  Provider's Schedule: Shanaye Rief, MD:  Procedure days: Tuesday and Thursday 7:30 AM to 4:00 PM  Bilal Lateef, MD:  Procedure days: Monday and Wednesday 7:30 AM to 4:00 PM ____________________________________________________________________________________________   ____________________________________________________________________________________________  Medication Rules  Purpose: To inform patients, and their family members, of our rules and regulations.  Applies to: All patients receiving prescriptions (written or electronic).  Pharmacy of record: Pharmacy where electronic prescriptions will be sent. If written prescriptions are taken to a different pharmacy, please inform the nursing staff. The pharmacy listed in the electronic medical record should be the one where you would like electronic prescriptions to be sent.  Electronic prescriptions: In compliance with the [place] Strengthen Opioid Misuse Prevention (STOP) Act of 2017 (Session Law 2017-74/H243), effective April 22, 2018, all controlled substances must be electronically prescribed. Calling prescriptions to the pharmacy will cease to exist.  Prescription refills: Only during scheduled appointments. Applies to all prescriptions.  NOTE: The following applies primarily to controlled substances (Opioid* Pain Medications).   Patient's responsibilities: 1. Pain Pills: Bring all pain pills to every appointment (except for procedure appointments). 2. Pill Bottles: Bring pills in original pharmacy bottle. Always bring the newest bottle. Bring bottle, even if empty. 3. Medication refills: You are responsible for knowing and keeping track of what medications you take and those you need refilled. The day before your appointment: write a list of all prescriptions that need to be refilled. The day of the appointment: give the list to the admitting nurse. Prescriptions will be written only during  appointments. No prescriptions will be written on procedure days. If you forget a medication: it will not be "Called in", "Faxed", or "electronically sent". You will need to get another appointment to get these prescribed. No early refills. Do not call asking to have your prescription filled early. 4. Prescription Accuracy: You are responsible for carefully inspecting your prescriptions before leaving our office. Have the discharge nurse carefully go over each prescription with you, before taking them home. Make sure that your name is accurately spelled, that your address is correct. Check the name and dose of your medication to make sure it is accurate. Check the number of pills, and the written instructions to make sure they are clear and accurate. Make sure that you are given enough medication to last until your next medication refill appointment. 5. Taking Medication: Take medication as prescribed. When it comes to controlled substances, taking less pills or less frequently than prescribed is permitted and encouraged. Never take more pills than instructed. Never take medication more frequently than prescribed.  6. Inform other Doctors: Always inform, all of your healthcare providers, of all the medications you take. 7. Pain Medication from other Providers: You are not allowed to accept any additional pain medication from any other Doctor or Healthcare provider. There are two exceptions to this rule. (see below) In the event that you require additional pain medication, you are responsible for notifying us, as stated below. 8. Medication Agreement: You are responsible for carefully reading and following our Medication Agreement. This must be signed before receiving any prescriptions from our practice. Safely store a copy of your signed Agreement. Violations to the Agreement will result in no further prescriptions. (Additional copies of our Medication Agreement are available upon request.) 9. Laws, Rules,  & Regulations: All patients are expected to follow all Federal   and State Laws, Statutes, Rules, & Regulations. Ignorance of the Laws does not constitute a valid excuse. The use of any illegal substances is prohibited. 10. Adopted CDC guidelines & recommendations: Target dosing levels will be at or below 60 MME/day. Use of benzodiazepines** is not recommended.  Exceptions: There are only two exceptions to the rule of not receiving pain medications from other Healthcare Providers. 1. Exception #1 (Emergencies): In the event of an emergency (i.e.: accident requiring emergency care), you are allowed to receive additional pain medication. However, you are responsible for: As soon as you are able, call our office [phone], at any time of the day or night, and leave a message stating your name, the date and nature of the emergency, and the name and dose of the medication prescribed. In the event that your call is answered by a member of our staff, make sure to document and save the date, time, and the name of the person that took your information.  2. Exception #2 (Planned Surgery): In the event that you are scheduled by another doctor or dentist to have any type of surgery or procedure, you are allowed (for a period no longer than 30 days), to receive additional pain medication, for the acute post-op pain. However, in this case, you are responsible for picking up a copy of our "Post-op Pain Management for Surgeons" handout, and giving it to your surgeon or dentist. This document is available at our office, and does not require an appointment to obtain it. Simply go to our office during business hours (Monday-Thursday from 8:00 AM to 4:00 PM) (Friday 8:00 AM to 12:00 Noon) or if you have a scheduled appointment with us, prior to your surgery, and ask for it by name. In addition, you will need to provide us with your name, name of your surgeon, type of surgery, and date of procedure or surgery.  *Opioid  medications include: morphine, codeine, oxycodone, oxymorphone, hydrocodone, hydromorphone, meperidine, tramadol, tapentadol, buprenorphine, fentanyl, methadone. **Benzodiazepine medications include: diazepam (Valium), alprazolam (Xanax), clonazepam (Klonopine), lorazepam (Ativan), clorazepate (Tranxene), chlordiazepoxide (Librium), estazolam (Prosom), oxazepam (Serax), temazepam (Restoril), triazolam (Halcion) (Last updated: 06/19/2017) ____________________________________________________________________________________________   ____________________________________________________________________________________________  Medication Recommendations and Reminders  Applies to: All patients receiving prescriptions (written and/or electronic).  Medication Rules & Regulations: These rules and regulations exist for your safety and that of others. They are not flexible and neither are we. Dismissing or ignoring them will be considered "non-compliance" with medication therapy, resulting in complete and irreversible termination of such therapy. (See document titled "Medication Rules" for more details.) In all conscience, because of safety reasons, we cannot continue providing a therapy where the patient does not follow instructions.  Pharmacy of record:   Definition: This is the pharmacy where your electronic prescriptions will be sent.   We do not endorse any particular pharmacy.  You are not restricted in your choice of pharmacy.  The pharmacy listed in the electronic medical record should be the one where you want electronic prescriptions to be sent.  If you choose to change pharmacy, simply notify our nursing staff of your choice of new pharmacy.  Recommendations:  Keep all of your pain medications in a safe place, under lock and key, even if you live alone.   After you fill your prescription, take 1 week's worth of pills and put them away in a safe place. You should keep a separate,  properly labeled bottle for this purpose. The remainder should be kept in the original bottle. Use   this as your primary supply, until it runs out. Once it's gone, then you know that you have 1 week's worth of medicine, and it is time to come in for a prescription refill. If you do this correctly, it is unlikely that you will ever run out of medicine.  To make sure that the above recommendation works, it is very important that you make sure your medication refill appointments are scheduled at least 1 week before you run out of medicine. To do this in an effective manner, make sure that you do not leave the office without scheduling your next medication management appointment. Always ask the nursing staff to show you in your prescription , when your medication will be running out. Then arrange for the receptionist to get you a return appointment, at least 7 days before you run out of medicine. Do not wait until you have 1 or 2 pills left, to come in. This is very poor planning and does not take into consideration that we may need to cancel appointments due to bad weather, sickness, or emergencies affecting our staff.  "Partial Fill": If for any reason your pharmacy does not have enough pills/tablets to completely fill or refill your prescription, do not allow for a "partial fill". You will need a separate prescription to fill the remaining amount, which we will not provide. If the reason for the partial fill is your insurance, you will need to talk to the pharmacist about payment alternatives for the remaining tablets, but again, do not accept a partial fill.  Prescription refills and/or changes in medication(s):   Prescription refills, and/or changes in dose or medication, will be conducted only during scheduled medication management appointments. (Applies to both, written and electronic prescriptions.)  No refills on procedure days. No medication will be changed or started on procedure days. No changes,  adjustments, and/or refills will be conducted on a procedure day. Doing so will interfere with the diagnostic portion of the procedure.  No phone refills. No medications will be "called into the pharmacy".  No Fax refills.  No weekend refills.  No Holliday refills.  No after hours refills.  Remember:  Business hours are:  Monday to Thursday 8:00 AM to 4:00 PM Provider's Schedule: Crystal King, NP - Appointments are:  Medication management: Monday to Thursday 8:00 AM to 4:00 PM Abygail Galeno, MD - Appointments are:  Medication management: Monday and Wednesday 8:00 AM to 4:00 PM Procedure day: Tuesday and Thursday 7:30 AM to 4:00 PM Bilal Lateef, MD - Appointments are:  Medication management: Tuesday and Thursday 8:00 AM to 4:00 PM Procedure day: Monday and Wednesday 7:30 AM to 4:00 PM (Last update: 06/19/2017) ____________________________________________________________________________________________   ____________________________________________________________________________________________  CANNABIDIOL (AKA: CBD Oil or Pills)  Applies to: All patients receiving prescriptions of controlled substances (written and/or electronic).  General Information: Cannabidiol (CBD) was discovered in 1940. It is one of some 113 identified cannabinoids in cannabis (Marijuana) plants, accounting for up to 40% of the plant's extract. As of 2018, preliminary clinical research on cannabidiol included studies of anxiety, cognition, movement disorders, and pain.  Cannabidiol is consummed in multiple ways, including inhalation of cannabis smoke or vapor, as an aerosol spray into the cheek, and by mouth. It may be supplied as CBD oil containing CBD as the active ingredient (no added tetrahydrocannabinol (THC) or terpenes), a full-plant CBD-dominant hemp extract oil, capsules, dried cannabis, or as a liquid solution. CBD is thought not have the same psychoactivity as THC, and may affect the   actions  of THC. Studies suggest that CBD may interact with different biological targets, including cannabinoid receptors and other neurotransmitter receptors. As of 2018 the mechanism of action for its biological effects has not been determined.  In the [place], cannabidiol has a limited approval by the Food and Drug Administration (FDA) for treatment of only two types of epilepsy disorders. The side effects of long-term use of the drug include somnolence, decreased appetite, diarrhea, fatigue, malaise, weakness, sleeping problems, and others.  CBD remains a Schedule I drug prohibited for any use.  Legality: Some manufacturers ship CBD products nationally, an illegal action which the FDA has not enforced in 2018, with CBD remaining the subject of an FDA investigational new drug evaluation, and is not considered legal as a dietary supplement or food ingredient as of December 2018. Federal illegality has made it difficult historically to conduct research on CBD. CBD is openly sold in head shops and health food stores in some states where such sales have not been explicitly legalized.  Warning: Because it is not FDA approved for general use or treatment of pain, it is not required to undergo the same manufacturing controls as prescription drugs.  This means that the available cannabidiol (CBD) may be contaminated with THC.  If this is the case, it will trigger a positive urine drug screen (UDS) test for cannabinoids (Marijuana).  Because a positive UDS for illicit substances is a violation of our medication agreement, your opioid analgesics (pain medicine) may be permanently discontinued. (Last update: 07/10/2017) ____________________________________________________________________________________________    

## 2018-08-17 NOTE — Telephone Encounter (Signed)
Called to pharmacy and cancelled oxycodone 5 mg that was written by Crystal and have been replaced by Rx's from Dr Dossie Arbour this a.m.

## 2018-08-17 NOTE — Progress Notes (Addendum)
Pain Management Virtual Encounter Note - Virtual Visit via Telephone Telehealth (real-time audio visits between healthcare provider and patient).  Patient's Phone No. & Preferred Pharmacy:  [phone] (home); [phone] (mobile); (Preferred) [phone] [email or web address]  [place] [place] [address] Phone: [phone] Fax: [phone]   Pre-screening note:  Our staff contacted Mr. Brier and offered him an "in person", "face-to-face" appointment versus a telephone encounter. He indicated preferring the telephone encounter, at this time.  Reason for Virtual Visit: COVID-19*  Social distancing based on CDC and AMA recommendations.   I contacted [name] on 08/17/2018 at 8:00 AM via telephone and clearly identified myself as Gaspar Cola, MD. I verified that I was speaking with the correct person using two identifiers (Name and date of birth: [birth year]).  Advanced Informed Consent I sought verbal advanced consent from [name] for virtual visit interactions. I informed Mr. Huhta of possible security and privacy concerns, risks, and limitations associated with providing "not-in-person" medical evaluation and management services. I also informed Mr. Volden of the availability of "in-person" appointments. Finally, I informed him that there would be a charge for the virtual visit and that he could be  personally, fully or partially, financially responsible for it. Mr. Maloof expressed understanding and agreed to proceed.   Historic Elements   Mr. James House. is a 48 y.o. year old, male patient evaluated today after his last encounter by our practice on 08/13/2018. Mr. Banks  has a past medical history of CKD (chronic kidney disease) stage 3, GFR 30-59 ml/min ([place]), Hypertension, Multiple myeloma ([place]) (08/11/2017), Neuropathy, and Pneumonia. He also  has a past surgical history  that includes Joint replacement; Knee surgery (Left); and Abdominal surgery. Mr. Gerrard has a current medication list which includes the following prescription(s): albuterol, aspirin ec, baclofen, gabapentin, lenalidomide, oxycodone, oxycodone, oxycodone, pregabalin, acyclovir, duloxetine, melatonin, and pantoprazole. He  reports that he quit smoking about 4 years ago. His smokeless tobacco use includes snuff. He reports that he does not drink alcohol or use drugs. Mr. Minasyan has No Known Allergies.   HPI  I last communicated with him on 07/09/2018. He is being evaluated for medication management.  The patient indicates having a flareup of his low back pain, bilaterally, with the right side being worse than the left.  He also indicates that this pain goes down both lower extremities through the posterior aspect of the leg, with the right side being worse on the left.  In the case of both sides of the pain goes down to the calf area but not into the foot.  This is atypical referred pain from the facet joints.  In addition, the patient indicates that hyperextension motion of the lumbar spine with rotation, flares up the pain, again confirming that this is a facet syndrome.  For some unexplained reason, his insurance company has decided to deny the diagnostic facet joint block #2.  This patient has already had physical therapy, which he was unable to complete due to the pain being aggravated, as it is usual with facet joint syndrome when exposed to physical therapy.  In the past we have done a diagnostic lumbar facet block which did provide patient with good relief for the duration of local anesthetic, unknown long-term benefit.  Unfortunately, further treatments have been blocked by the insurance company with a lame excuse that it is "experimental".  This type of treatment  is as experimental as using antibiotics for bacterial infections.  It is simply ridiculous and unethical for them to attempt to block treatment  needed by this individual.  In fact, today they have to force me to increase the patient's opioid analgesics in order to compensate for the lack of improvement created by them because of these constant denials.  At this point, it is my professional opinion that they are placing this patient's health in danger and exposing him to risk year forms of treatment, because of these denials.  Today's I am forced to increase this patient's pain medication to 1 tablet p.o. 3 times daily, from the prior regimen of 1 tablet p.o. daily.  Patient indicates that with this prior regimen he was unable to get out of bed due to this pain.  Post-Procedure Evaluation  Procedure: Diagnostic/therapeutic left-sided Piriformis muscle trigger Point Injection (1-2 muscle groups) #1  Pre-procedure pain level:  6/10 Post-procedure: 0/10 (100% relief)  Sedation: None.  Effectiveness during initial hour after procedure(Ultra-Short Term Relief): 100 %  Local anesthetic used: Long-acting (4-6 hours) Effectiveness: Defined as any analgesic benefit obtained secondary to the administration of local anesthetics. This carries significant diagnostic value as to the etiological location, or anatomical origin, of the pain. Duration of benefit is expected to coincide with the duration of the local anesthetic used.  Effectiveness during initial 4-6 hours after procedure(Short-Term Relief): 100 %  Long-term benefit: Defined as any relief past the pharmacologic duration of the local anesthetics.  Effectiveness past the initial 6 hours after procedure(Long-Term Relief): 0 %  Current benefits: Defined as benefit that persist at this time.   Analgesia:  Back to baseline Function: Back to baseline ROM: Back to baseline  Pharmacotherapy Assessment  Analgesic: Oxycodone IR 5 mg 1 tablet p.o. twice daily (10 mg/day of oxycodone) MME/day: 15 mg/day.   Monitoring: Pharmacotherapy: No side-effects or adverse reactions reported. [place] PMP: PDMP  reviewed during this encounter.       Compliance: No problems identified or detected. Plan: Refer to "POC".  Review of recent tests  DG C-Arm 1-60 Min-No Report Fluoroscopy was utilized by the requesting physician.  No radiographic  interpretation.    Appointment on 08/12/2018  Component Date Value Ref Range Status  . Kappa free light chain 08/12/2018 22.5* 3.3 - 19.4 mg/L Final  . Lamda free light chains 08/12/2018 13.8  5.7 - 26.3 mg/L Final  . Kappa, lamda light chain ratio 08/12/2018 1.63  0.26 - 1.65 Final   Comment: (NOTE) Performed At: [facility] [place], [place] [id number] Rush Farmer MD VO:[phone]   . WBC 08/12/2018 3.2* 4.0 - 10.5 K/uL Final  . RBC 08/12/2018 4.03* 4.22 - 5.81 MIL/uL Final  . Hemoglobin 08/12/2018 14.1  13.0 - 17.0 g/dL Final  . HCT 08/12/2018 40.1  39.0 - 52.0 % Final  . MCV 08/12/2018 99.5  80.0 - 100.0 fL Final  . MCH 08/12/2018 35.0* 26.0 - 34.0 pg Final  . MCHC 08/12/2018 35.2  30.0 - 36.0 g/dL Final  . RDW 08/12/2018 14.0  11.5 - 15.5 % Final  . Platelets 08/12/2018 125* 150 - 400 K/uL Final  . nRBC 08/12/2018 0.0  0.0 - 0.2 % Final  . Neutrophils Relative % 08/12/2018 53  % Final  . Neutro Abs 08/12/2018 1.7  1.7 - 7.7 K/uL Final  . Lymphocytes Relative 08/12/2018 25  % Final  . Lymphs Abs 08/12/2018 0.8  0.7 - 4.0 K/uL Final  . Monocytes Relative 08/12/2018  15  % Final  . Monocytes Absolute 08/12/2018 0.5  0.1 - 1.0 K/uL Final  . Eosinophils Relative 08/12/2018 5  % Final  . Eosinophils Absolute 08/12/2018 0.2  0.0 - 0.5 K/uL Final  . Basophils Relative 08/12/2018 2  % Final  . Basophils Absolute 08/12/2018 0.1  0.0 - 0.1 K/uL Final  . Immature Granulocytes 08/12/2018 0  % Final  . Abs Immature Granulocytes 08/12/2018 0.01  0.00 - 0.07 K/uL Final   Performed at [facility], [address] [place], [place] [address]  . Sodium 08/12/2018 138  135 - 145 mmol/L Final  . Potassium 08/12/2018 4.0  3.5 -  5.1 mmol/L Final  . Chloride 08/12/2018 103  98 - 111 mmol/L Final  . CO2 08/12/2018 28  22 - 32 mmol/L Final  . Glucose, Bld 08/12/2018 95  70 - 99 mg/dL Final  . BUN 08/12/2018 20  6 - 20 mg/dL Final  . Creatinine, Ser 08/12/2018 1.72* 0.61 - 1.24 mg/dL Final  . Calcium 08/12/2018 9.3  8.9 - 10.3 mg/dL Final  . Total Protein 08/12/2018 7.2  6.5 - 8.1 g/dL Final  . Albumin 08/12/2018 4.0  3.5 - 5.0 g/dL Final  . AST 08/12/2018 22  15 - 41 U/L Final  . ALT 08/12/2018 21  0 - 44 U/L Final  . Alkaline Phosphatase 08/12/2018 60  38 - 126 U/L Final  . Total Bilirubin 08/12/2018 0.8  0.3 - 1.2 mg/dL Final  . GFR calc non Af Amer 08/12/2018 46* >60 mL/min Final  . GFR calc Af Amer 08/12/2018 54* >60 mL/min Final  . Anion gap 08/12/2018 7  5 - 15 Final   Performed at [facility], [address] [place], [place] [address]   Assessment  The primary encounter diagnosis was Chronic pain syndrome. Diagnoses of Lumbar facet syndrome (Bilateral) (L>R), Chemotherapy-induced peripheral neuropathy (HCC), Multiple myeloma, remission status unspecified ([place]), Spasm of back muscles, Chronic musculoskeletal pain, Neuropathic pain, and Neurogenic pain were also pertinent to this visit.  Plan of Care  I have changed Winona Legato Jr.'s oxyCODONE, oxyCODONE, and oxyCODONE. I am also having him maintain his acyclovir, pantoprazole, aspirin EC, albuterol, Melatonin, DULoxetine, lenalidomide, baclofen, gabapentin, and pregabalin.  Pharmacotherapy (Medications Ordered): Meds ordered this encounter  Medications  . oxyCODONE (OXY IR/ROXICODONE) 5 MG immediate release tablet    Sig: Take 1 tablet (5 mg total) by mouth every 8 (eight) hours as needed for up to 30 days for severe pain. Must last 30 days.    Dispense:  90 tablet    Refill:  0    [place] STOP ACT - Not applicable to Chronic Pain Syndrome (G89.4) diagnosis. Fill one day early if pharmacy is closed on scheduled refill date. Do not fill until:  10/16/18. To last until: 11/15/18.  [place] Kitchen oxyCODONE (OXY IR/ROXICODONE) 5 MG immediate release tablet    Sig: Take 1 tablet (5 mg total) by mouth every 8 (eight) hours as needed for up to 30 days for severe pain. Must last 30 days.    Dispense:  90 tablet    Refill:  0    [place] STOP ACT - Not applicable to Chronic Pain Syndrome (G89.4) diagnosis. Fill one day early if pharmacy is closed on scheduled refill date. Do not fill until: 09/16/18. To last until: 10/16/18.  Marland Kitchen oxyCODONE (OXY IR/ROXICODONE) 5 MG immediate release tablet    Sig: Take 1 tablet (5 mg total) by mouth every 8 (eight) hours as needed  for up to 30 days for severe pain. Must last 30 days.    Dispense:  90 tablet    Refill:  0    [place] STOP ACT - Not applicable to Chronic Pain Syndrome (G89.4) diagnosis. Fill one day early if pharmacy is closed on scheduled refill date. Do not fill until: 08/17/18. To last until: 09/16/18.   Orders:  Orders Placed This Encounter  Procedures  . LUMBAR FACET(MEDIAL BRANCH NERVE BLOCK) MBNB    Standing Status:   Future    Standing Expiration Date:   09/16/2018    Scheduling Instructions:     Side: Bilateral     Level: L3-4, L4-5, & L5-S1 Facets (L2, L3, L4, L5, & S1 Medial Branch Nerves)     Sedation: Patient's choice.     Timeframe: ASAA    Order Specific Question:   Where will this procedure be performed?    Answer:   [facility]   Follow-up plan:   Return in about 3 months (around 11/16/2018) for Med-Mgmt, in addition, Procedure, (w/ sedation): (B) L-FCT BLK #2.   I discussed the assessment and treatment plan with the patient. The patient was provided an opportunity to ask questions and all were answered. The patient agreed with the plan and demonstrated an understanding of the instructions.  Patient advised to call back or seek an in-person evaluation if the symptoms or condition worsens.  Total duration of non-face-to-face encounter: 12 minutes.  Note by: Gaspar Cola,  MD Date: 08/17/2018; Time: 8:45 AM  Disclaimer:  * Given the special circumstances of the COVID-19 pandemic, the federal government has announced that the Office for Civil Rights (OCR) will exercise its enforcement discretion and will not impose penalties on physicians using telehealth in the event of noncompliance with regulatory requirements under the [place] and Accountability Act (HIPAA) in connection with the good faith provision of telehealth during the JKKXF-81 national public health emergency. ([place])

## 2018-08-17 NOTE — Telephone Encounter (Signed)
Called patient to c/o clinical intake and go over medications.

## 2018-08-18 ENCOUNTER — Telehealth: Payer: Self-pay | Admitting: Pain Medicine

## 2018-08-18 ENCOUNTER — Inpatient Hospital Stay (HOSPITAL_BASED_OUTPATIENT_CLINIC_OR_DEPARTMENT_OTHER): Payer: No Typology Code available for payment source | Admitting: Oncology

## 2018-08-18 DIAGNOSIS — M5441 Lumbago with sciatica, right side: Secondary | ICD-10-CM

## 2018-08-18 DIAGNOSIS — G620 Drug-induced polyneuropathy: Secondary | ICD-10-CM | POA: Diagnosis not present

## 2018-08-18 DIAGNOSIS — C9001 Multiple myeloma in remission: Secondary | ICD-10-CM

## 2018-08-18 DIAGNOSIS — Z7984 Long term (current) use of oral hypoglycemic drugs: Secondary | ICD-10-CM

## 2018-08-18 DIAGNOSIS — N183 Chronic kidney disease, stage 3 (moderate): Secondary | ICD-10-CM | POA: Diagnosis not present

## 2018-08-18 DIAGNOSIS — Z79899 Other long term (current) drug therapy: Secondary | ICD-10-CM

## 2018-08-18 DIAGNOSIS — T451X5A Adverse effect of antineoplastic and immunosuppressive drugs, initial encounter: Secondary | ICD-10-CM

## 2018-08-18 DIAGNOSIS — Z7951 Long term (current) use of inhaled steroids: Secondary | ICD-10-CM

## 2018-08-18 DIAGNOSIS — M5442 Lumbago with sciatica, left side: Principal | ICD-10-CM

## 2018-08-18 DIAGNOSIS — I10 Essential (primary) hypertension: Secondary | ICD-10-CM | POA: Diagnosis not present

## 2018-08-18 DIAGNOSIS — Z87891 Personal history of nicotine dependence: Secondary | ICD-10-CM

## 2018-08-18 DIAGNOSIS — Z7982 Long term (current) use of aspirin: Secondary | ICD-10-CM

## 2018-08-18 MED FILL — GABAPENTIN 600 MG TABLET: 600 | 30 days supply | Qty: 90 | Fill #1

## 2018-08-18 MED FILL — oxyCODONE HCL 5 MG TABS: 5 | 30 days supply | Qty: 90 | Fill #0

## 2018-08-18 NOTE — Telephone Encounter (Signed)
Pt called stating that he thought Dr. Delane Ginger was going to increase his dose on his pain medications bc he discussed with Dr Delane Ginger that what he was taking wasn't enough. Pt states the same rx that he always gets is at [facility].

## 2018-08-18 NOTE — Telephone Encounter (Signed)
Called patient to let him know that in fact the Rx that was sent in by Dr Dossie Arbour was an increase from 0.5 tab to 1 tab q 8 hours.  Patient verbalizes u/o information.

## 2018-08-20 ENCOUNTER — Encounter: Payer: Self-pay | Admitting: *Deleted

## 2018-08-20 ENCOUNTER — Other Ambulatory Visit: Payer: Self-pay | Admitting: *Deleted

## 2018-08-20 DIAGNOSIS — C9001 Multiple myeloma in remission: Secondary | ICD-10-CM

## 2018-08-20 MED ORDER — LENALIDOMIDE 5 MG PO CAPS: ORAL_CAPSULE | ORAL | 0 refills | Status: DC

## 2018-08-21 ENCOUNTER — Encounter: Payer: Self-pay | Admitting: Oncology

## 2018-08-21 NOTE — Progress Notes (Signed)
I connected with James House on 08/21/18 at  2:00 PM EDT by video enabled telemedicine visit and verified that I am speaking with the correct person using two identifiers.   I discussed the limitations, risks, security and privacy concerns of performing an evaluation and management service by telemedicine and the availability of in-person appointments. I also discussed with the patient that there may be a patient responsible charge related to this service. The patient expressed understanding and agreed to proceed.  Other persons participating in the visit and their role in the encounter:  none  Patient's location:  home Provider's location:  Home  Diagnosis- light chain kappa multiple myeloma s/p ASCT currently in remission. Patient on maintenance revlimid  Chief Complaint: Routine follow-up of multiple myeloma in remission  History of present illness: patient is a 48 year old male with a history of kappa light chain myeloma that was treated by [name]. History is as follows:  1. Patient presented with renal insufficiency with a creatinine of 2.4 in April 2018. Kidney biopsy showed myeloma cast nephropathy. Kappa light chain was 3004 and lambda 0.22 with a kappa lambda ratio of 13,000 655. Skeletal survey showed multiple calvarial and marrow lesions. Bone marrow biopsy in May 2018 showed 43% plasma cells. He received CyBorDfor cycle 1 in May 2018 and was subsequently switched to RVD. He received 4 cycles followed by a repeat bone marrow biopsy in August 2018 which showed no increase in plasma cells.  2.He underwent autologous stem cell transplantation on 01/30/2017. He was started on Revlimid maintenance 10 mg daily in January 2019.  3.Labs from January February and March 2019 done at [facility] showed no M spike, normal kappa lambda ratio of 1.06. He was last seen by them in April 2019.  Following that patient was admitted to [facility] for healthcare associated pneumonia and  was recently discharged. He wished to transfer his care to [place] at this time.  With regards to his myeloma patient is currently on 10 mg of Revlimid daily. He is also on acyclovir 400 mg twice daily which he is to continue for a minimum of 1 year post transplant. Patient has chronic back pain as well as chemo-induced peripheral neuropathy for which he was seen by [name] in the past and is currently seeing [facility] pain clinic and is under pain contract with them   Interval history patient reports that his back pain is getting worse and he has sharp shooting pains radiating down his right leg.  This along with his pre-existing peripheral neuropathy in his feet is making it difficult for him to walk.  His oxycodone dose was increased by pain clinic and he continues to be on gabapentin and Lyrica as well as Cymbalta.  Initially the plan was to proceed with a lumbar facet block but patient states that this was not approved by his insurance.   Review of Systems  Constitutional: Negative for chills, fever, malaise/fatigue and weight loss.  HENT: Negative for congestion, ear discharge and nosebleeds.   Eyes: Negative for blurred vision.  Respiratory: Negative for cough, hemoptysis, sputum production, shortness of breath and wheezing.   Cardiovascular: Negative for chest pain, palpitations, orthopnea and claudication.  Gastrointestinal: Negative for abdominal pain, blood in stool, constipation, diarrhea, heartburn, melena, nausea and vomiting.  Genitourinary: Negative for dysuria, flank pain, frequency, hematuria and urgency.  Musculoskeletal: Positive for back pain. Negative for joint pain and myalgias.  Skin: Negative for rash.  Neurological: Positive for sensory change (Peripheral neuropathy). Negative  for dizziness, tingling, focal weakness, seizures, weakness and headaches.  Endo/Heme/Allergies: Does not bruise/bleed easily.  Psychiatric/Behavioral: Negative for depression and suicidal  ideas. The patient does not have insomnia.     No Known Allergies  Past Medical History:  Diagnosis Date  . CKD (chronic kidney disease) stage 3, GFR 30-59 ml/min (HCC)   . Hypertension   . Multiple myeloma ([place]) 08/11/2017  . Neuropathy   . Pneumonia     Past Surgical History:  Procedure Laterality Date  . ABDOMINAL SURGERY    . JOINT REPLACEMENT     right knee  . KNEE SURGERY Left     Social History   Socioeconomic History  . Marital status: Married    Spouse name: [name]  . Number of children: 3  . Years of education: Not on file  . Highest education level: Not on file  Occupational History  . Not on file  Social Needs  . Financial resource strain: Not hard at all  . Food insecurity:    Worry: Never true    Inability: Never true  . Transportation needs:    Medical: No    Non-medical: No  Tobacco Use  . Smoking status: Former Smoker    Last attempt to quit: 08/19/2013    Years since quitting: 5.0  . Smokeless tobacco: Current User    Types: Snuff  Substance and Sexual Activity  . Alcohol use: No  . Drug use: No  . Sexual activity: Yes  Lifestyle  . Physical activity:    Days per week: 7 days    Minutes per session: 30 min  . Stress: Only a little  Relationships  . Social connections:    Talks on phone: Once a week    Gets together: Once a week    Attends religious service: More than 4 times per year    Active member of club or organization: No    Attends meetings of clubs or organizations: Never    Relationship status: Married  . Intimate partner violence:    Fear of current or ex partner: No    Emotionally abused: No    Physically abused: No    Forced sexual activity: No  Other Topics Concern  . Not on file  Social History Narrative   Live in private residence with spouse and mother in law    Family History  Problem Relation Age of Onset  . Cancer Mother 47       Pancreatic  . COPD Mother   . Diabetes Mother   . Hyperlipidemia Mother    . Hypertension Mother   . Cancer Maternal Uncle        pancreatic  . Cancer Maternal Grandmother 55       colon  . COPD Maternal Grandmother   . Diabetes Maternal Grandmother   . Hypertension Maternal Grandmother      Current Outpatient Medications:  .  aspirin EC 81 MG tablet, Take 81 mg by mouth daily., Disp: , Rfl:  .  DULoxetine (CYMBALTA) 30 MG capsule, Take 1 capsule (30 mg total) by mouth as directed. 1 capsule daily x 7 days and then increase to 2 capsules daily for the rest of the month, Disp: 53 capsule, Rfl: 0 .  gabapentin (NEURONTIN) 600 MG tablet, Take 1 tablet (600 mg total) by mouth 3 (three) times daily., Disp: 90 tablet, Rfl: 2 .  [START ON 10/16/2018] oxyCODONE (OXY IR/ROXICODONE) 5 MG immediate release tablet, Take 1 tablet (5 mg total) by  mouth every 8 (eight) hours as needed for up to 30 days for severe pain. Must last 30 days., Disp: 90 tablet, Rfl: 0 .  oxyCODONE (OXY IR/ROXICODONE) 5 MG immediate release tablet, Take 1 tablet (5 mg total) by mouth every 8 (eight) hours as needed for up to 30 days for severe pain. Must last 30 days., Disp: 90 tablet, Rfl: 0 .  pregabalin (LYRICA) 150 MG capsule, Take 1 capsule (150 mg total) by mouth 3 (three) times daily., Disp: 90 capsule, Rfl: 2 .  acyclovir (ZOVIRAX) 200 MG capsule, Take 200 mg by mouth 2 (two) times daily. , Disp: , Rfl:  .  albuterol (PROVENTIL HFA;VENTOLIN HFA) 108 (90 Base) MCG/ACT inhaler, Inhale 2 puffs into the lungs every 6 (six) hours as needed for wheezing or shortness of breath. (Patient not taking: Reported on 08/18/2018), Disp: 1 Inhaler, Rfl: 0 .  baclofen (LIORESAL) 10 MG tablet, Take 1-2 tablets (10-20 mg total) by mouth 4 (four) times daily. (Patient not taking: Reported on 08/18/2018), Disp: 240 tablet, Rfl: 2 .  lenalidomide (REVLIMID) 5 MG capsule, TAKE 1 CAPSULE BY MOUTH ONE TIME DAILY, Disp: 56 capsule, Rfl: 0 .  [START ON 09/16/2018] oxyCODONE (OXY IR/ROXICODONE) 5 MG immediate release tablet,  Take 1 tablet (5 mg total) by mouth every 8 (eight) hours as needed for up to 30 days for severe pain. Must last 30 days., Disp: 90 tablet, Rfl: 0  No results found.  No images are attached to the encounter.   CMP Latest Ref Rng & Units 08/12/2018  Glucose 70 - 99 mg/dL 95  BUN 6 - 20 mg/dL 20  Creatinine 0.61 - 1.24 mg/dL 1.72(H)  Sodium 135 - 145 mmol/L 138  Potassium 3.5 - 5.1 mmol/L 4.0  Chloride 98 - 111 mmol/L 103  CO2 22 - 32 mmol/L 28  Calcium 8.9 - 10.3 mg/dL 9.3  Total Protein 6.5 - 8.1 g/dL 7.2  Total Bilirubin 0.3 - 1.2 mg/dL 0.8  Alkaline Phos 38 - 126 U/L 60  AST 15 - 41 U/L 22  ALT 0 - 44 U/L 21   CBC Latest Ref Rng & Units 08/12/2018  WBC 4.0 - 10.5 K/uL 3.2(L)  Hemoglobin 13.0 - 17.0 g/dL 14.1  Hematocrit 39.0 - 52.0 % 40.1  Platelets 150 - 400 K/uL 125(L)     Observation/objective: Appears in no acute distress over your visit today.  Breathing is nonlabored  Assessment and plan:with kappa light chain multiple myeloma status post RVD chemotherapy followed by autologous stem cell transplantation in August 2018 currently in remission and on maintenance Revlimid.  This is a routine follow-up visit for multiple myeloma  1.  Multiple myeloma currently is in remission.  Serum free light chain ratio is normal and myeloma panel shows no M protein.  We will continue Revlimid at 5 mg daily.  He will continue to get monthly Xgeva.  2.  Given his worsening acute back pain I will also obtain an MRI of the lumbar and thoracic spine with and without contrast to see if this is myeloma related or degenerative disease.  Follow-up instructions: Mri thoracic and lumbar spine with and without conrast asap Labs and Xgeva in 1 month Labs: CBC with differential, CMP, myeloma panel, serum free light chain in 2 months.  See MD and gets Delton See   I discussed the assessment and treatment plan with the patient. The patient was provided an opportunity to ask questions and all were answered.  The patient agreed with the  plan and demonstrated an understanding of the instructions.   The patient was advised to call back or seek an in-person evaluation if the symptoms worsen or if the condition fails to improve as anticipated.   Visit Diagnosis: 1. Acute midline low back pain with bilateral sciatica   2. Chemotherapy-induced peripheral neuropathy ([place])   3. Multiple myeloma in remission [facility])     Dr. Randa Evens, MD, MPH [facility] at [facility] [phone] 08/21/2018 8:38 AM

## 2018-08-25 ENCOUNTER — Other Ambulatory Visit: Payer: Self-pay

## 2018-08-25 MED FILL — PREGABALIN 150 MG CAPS: 150 | 30 days supply | Qty: 90 | Fill #1

## 2018-08-26 ENCOUNTER — Inpatient Hospital Stay: Payer: No Typology Code available for payment source | Attending: Oncology

## 2018-08-26 ENCOUNTER — Other Ambulatory Visit: Payer: Self-pay

## 2018-08-26 DIAGNOSIS — C9001 Multiple myeloma in remission: Secondary | ICD-10-CM

## 2018-08-26 DIAGNOSIS — N183 Chronic kidney disease, stage 3 (moderate): Secondary | ICD-10-CM | POA: Insufficient documentation

## 2018-08-26 LAB — CBC WITH DIFFERENTIAL/PLATELET
Abs Immature Granulocytes: 0.01 10*3/uL (ref 0.00–0.07)
Basophils Absolute: 0 10*3/uL (ref 0.0–0.1)
Basophils Relative: 1 %
Eosinophils Absolute: 0.3 10*3/uL (ref 0.0–0.5)
Eosinophils Relative: 7 %
HCT: 36.2 % — ABNORMAL LOW (ref 39.0–52.0)
Hemoglobin: 12.5 g/dL — ABNORMAL LOW (ref 13.0–17.0)
Immature Granulocytes: 0 %
Lymphocytes Relative: 21 %
Lymphs Abs: 0.7 10*3/uL (ref 0.7–4.0)
MCH: 34.7 pg — ABNORMAL HIGH (ref 26.0–34.0)
MCHC: 34.5 g/dL (ref 30.0–36.0)
MCV: 100.6 fL — ABNORMAL HIGH (ref 80.0–100.0)
Monocytes Absolute: 0.6 10*3/uL (ref 0.1–1.0)
Monocytes Relative: 17 %
Neutro Abs: 1.8 10*3/uL (ref 1.7–7.7)
Neutrophils Relative %: 54 %
Platelets: 138 10*3/uL — ABNORMAL LOW (ref 150–400)
RBC: 3.6 MIL/uL — ABNORMAL LOW (ref 4.22–5.81)
RDW: 13.8 % (ref 11.5–15.5)
WBC: 3.4 10*3/uL — ABNORMAL LOW (ref 4.0–10.5)
nRBC: 0 % (ref 0.0–0.2)

## 2018-08-26 LAB — COMPREHENSIVE METABOLIC PANEL
ALT: 22 U/L (ref 0–44)
AST: 22 U/L (ref 15–41)
Albumin: 3.9 g/dL (ref 3.5–5.0)
Alkaline Phosphatase: 54 U/L (ref 38–126)
Anion gap: 6 (ref 5–15)
BUN: 15 mg/dL (ref 6–20)
CO2: 27 mmol/L (ref 22–32)
Calcium: 8.5 mg/dL — ABNORMAL LOW (ref 8.9–10.3)
Chloride: 106 mmol/L (ref 98–111)
Creatinine, Ser: 1.64 mg/dL — ABNORMAL HIGH (ref 0.61–1.24)
GFR calc Af Amer: 57 mL/min — ABNORMAL LOW (ref >60)
GFR calc non Af Amer: 49 mL/min — ABNORMAL LOW (ref >60)
Glucose, Bld: 92 mg/dL (ref 70–99)
Potassium: 3.7 mmol/L (ref 3.5–5.1)
Sodium: 139 mmol/L (ref 135–145)
Total Bilirubin: 0.7 mg/dL (ref 0.3–1.2)
Total Protein: 6.7 g/dL (ref 6.5–8.1)

## 2018-08-27 ENCOUNTER — Ambulatory Visit: Payer: No Typology Code available for payment source

## 2018-08-27 ENCOUNTER — Ambulatory Visit: Admission: RE | Admit: 2018-08-27 | Payer: No Typology Code available for payment source | Source: Ambulatory Visit

## 2018-08-27 ENCOUNTER — Telehealth: Payer: Self-pay | Admitting: *Deleted

## 2018-08-27 NOTE — Telephone Encounter (Signed)
Got a call from pharmacy about revlimid. [facility] is letting patients get 56 day supply during there corona virus. I put in the 56 pills and got rems number for it. His insurance requires PA for the increase dose and according to pharmacy it will take up to 1 week and when they called patient he did not want to wait to get it approved because he would be late to start his dose. They want me to call [facility] and get the number of pills to 28 and get a new auth or some how fix it. I called [facility] and staff told me that Josem Kaufmann has been accepted by [facility] and there is nothing they can do. I will have to wait to patient gets won to 7 pills and call [facility] again about the circumstances and then they can override but no until he is down to 7 pills. Then I called [facility] and spoke to [facility] and told her above. The auth was never suppose to be authorized until it was corrected. She got on 3 way call to [facility] and withdrew the auth then [facility] corrected it and now everything is ok and he will only get 28 tablets and it fixed on [facility] and [employer] [facility] side

## 2018-09-03 MED FILL — BACLOFEN 10 MG TABS: 10 | 30 days supply | Qty: 240 | Fill #1

## 2018-09-10 MED FILL — GABAPENTIN 600 MG TABLET: 600 | 30 days supply | Qty: 90 | Fill #2

## 2018-09-11 ENCOUNTER — Encounter: Payer: Self-pay | Admitting: *Deleted

## 2018-09-11 ENCOUNTER — Ambulatory Visit
Admission: RE | Admit: 2018-09-11 | Discharge: 2018-09-11 | Disposition: A | Discharge status: Home / Self Care | Payer: No Typology Code available for payment source | Source: Ambulatory Visit | Attending: Oncology | Admitting: Oncology

## 2018-09-11 ENCOUNTER — Other Ambulatory Visit: Payer: Self-pay

## 2018-09-11 DIAGNOSIS — M5441 Lumbago with sciatica, right side: Secondary | ICD-10-CM

## 2018-09-11 DIAGNOSIS — M5442 Lumbago with sciatica, left side: Secondary | ICD-10-CM | POA: Diagnosis present

## 2018-09-11 MED ORDER — GADOBUTROL 1 MMOL/ML IV SOLN
6.0000 mL | Freq: Once | INTRAVENOUS | Status: AC | PRN
  Administered 2018-09-11: 10:00:00 6 mL via INTRAVENOUS

## 2018-09-16 ENCOUNTER — Other Ambulatory Visit: Payer: Self-pay

## 2018-09-17 ENCOUNTER — Inpatient Hospital Stay: Payer: No Typology Code available for payment source

## 2018-09-17 ENCOUNTER — Other Ambulatory Visit: Payer: Self-pay

## 2018-09-17 ENCOUNTER — Other Ambulatory Visit: Payer: Self-pay | Admitting: Oncology

## 2018-09-17 ENCOUNTER — Other Ambulatory Visit: Payer: Self-pay | Admitting: *Deleted

## 2018-09-17 DIAGNOSIS — C9001 Multiple myeloma in remission: Secondary | ICD-10-CM

## 2018-09-17 DIAGNOSIS — Z9481 Bone marrow transplant status: Secondary | ICD-10-CM

## 2018-09-17 DIAGNOSIS — Z299 Encounter for prophylactic measures, unspecified: Secondary | ICD-10-CM

## 2018-09-17 LAB — COMPREHENSIVE METABOLIC PANEL
ALT: 16 U/L (ref 0–44)
AST: 21 U/L (ref 15–41)
Albumin: 3.8 g/dL (ref 3.5–5.0)
Alkaline Phosphatase: 50 U/L (ref 38–126)
Anion gap: 6 (ref 5–15)
BUN: 16 mg/dL (ref 6–20)
CO2: 26 mmol/L (ref 22–32)
Calcium: 8.6 mg/dL — ABNORMAL LOW (ref 8.9–10.3)
Chloride: 109 mmol/L (ref 98–111)
Creatinine, Ser: 2.44 mg/dL — ABNORMAL HIGH (ref 0.61–1.24)
GFR calc Af Amer: 35 mL/min — ABNORMAL LOW (ref >60)
GFR calc non Af Amer: 30 mL/min — ABNORMAL LOW (ref >60)
Glucose, Bld: 120 mg/dL — ABNORMAL HIGH (ref 70–99)
Potassium: 4.3 mmol/L (ref 3.5–5.1)
Sodium: 141 mmol/L (ref 135–145)
Total Bilirubin: 0.6 mg/dL (ref 0.3–1.2)
Total Protein: 6.5 g/dL (ref 6.5–8.1)

## 2018-09-17 LAB — CBC WITH DIFFERENTIAL/PLATELET
Abs Immature Granulocytes: 0.01 10*3/uL (ref 0.00–0.07)
Basophils Absolute: 0 10*3/uL (ref 0.0–0.1)
Basophils Relative: 1 %
Eosinophils Absolute: 0.1 10*3/uL (ref 0.0–0.5)
Eosinophils Relative: 4 %
HCT: 37.3 % — ABNORMAL LOW (ref 39.0–52.0)
Hemoglobin: 13 g/dL (ref 13.0–17.0)
Immature Granulocytes: 0 %
Lymphocytes Relative: 14 %
Lymphs Abs: 0.5 10*3/uL — ABNORMAL LOW (ref 0.7–4.0)
MCH: 34.7 pg — ABNORMAL HIGH (ref 26.0–34.0)
MCHC: 34.9 g/dL (ref 30.0–36.0)
MCV: 99.5 fL (ref 80.0–100.0)
Monocytes Absolute: 0.2 10*3/uL (ref 0.1–1.0)
Monocytes Relative: 6 %
Neutro Abs: 3 10*3/uL (ref 1.7–7.7)
Neutrophils Relative %: 75 %
Platelets: 145 10*3/uL — ABNORMAL LOW (ref 150–400)
RBC: 3.75 MIL/uL — ABNORMAL LOW (ref 4.22–5.81)
RDW: 13.5 % (ref 11.5–15.5)
WBC: 3.9 10*3/uL — ABNORMAL LOW (ref 4.0–10.5)
nRBC: 0 % (ref 0.0–0.2)

## 2018-09-17 MED ORDER — DENOSUMAB 120 MG/1.7ML ~~LOC~~ SOLN
120.0000 mg | SUBCUTANEOUS | Status: DC
  Administered 2018-09-17: 120 mg via SUBCUTANEOUS

## 2018-09-17 MED FILL — oxyCODONE HCL 5 MG TABS: 5 | 30 days supply | Qty: 90 | Fill #0

## 2018-09-28 ENCOUNTER — Other Ambulatory Visit: Payer: Self-pay | Admitting: *Deleted

## 2018-09-28 DIAGNOSIS — R7989 Other specified abnormal findings of blood chemistry: Secondary | ICD-10-CM | POA: Insufficient documentation

## 2018-09-29 ENCOUNTER — Inpatient Hospital Stay: Payer: No Typology Code available for payment source

## 2018-09-29 MED FILL — PREGABALIN 150 MG CAPS: 150 | 30 days supply | Qty: 90 | Fill #2

## 2018-09-30 ENCOUNTER — Other Ambulatory Visit: Payer: Self-pay

## 2018-09-30 ENCOUNTER — Inpatient Hospital Stay: Payer: No Typology Code available for payment source | Attending: Oncology

## 2018-09-30 ENCOUNTER — Telehealth: Payer: Self-pay | Admitting: *Deleted

## 2018-09-30 ENCOUNTER — Other Ambulatory Visit: Payer: Self-pay | Admitting: *Deleted

## 2018-09-30 ENCOUNTER — Inpatient Hospital Stay: Payer: No Typology Code available for payment source

## 2018-09-30 DIAGNOSIS — R7989 Other specified abnormal findings of blood chemistry: Secondary | ICD-10-CM

## 2018-09-30 DIAGNOSIS — C9001 Multiple myeloma in remission: Secondary | ICD-10-CM | POA: Diagnosis present

## 2018-09-30 DIAGNOSIS — R3911 Hesitancy of micturition: Secondary | ICD-10-CM

## 2018-09-30 LAB — BASIC METABOLIC PANEL
Anion gap: 5 (ref 5–15)
BUN: 21 mg/dL — ABNORMAL HIGH (ref 6–20)
CO2: 28 mmol/L (ref 22–32)
Calcium: 8.7 mg/dL — ABNORMAL LOW (ref 8.9–10.3)
Chloride: 108 mmol/L (ref 98–111)
Creatinine, Ser: 1.64 mg/dL — ABNORMAL HIGH (ref 0.61–1.24)
GFR calc Af Amer: 57 mL/min — ABNORMAL LOW (ref >60)
GFR calc non Af Amer: 49 mL/min — ABNORMAL LOW (ref >60)
Glucose, Bld: 99 mg/dL (ref 70–99)
Potassium: 4 mmol/L (ref 3.5–5.1)
Sodium: 141 mmol/L (ref 135–145)

## 2018-09-30 NOTE — Telephone Encounter (Signed)
I called patient while he was waiting to determine if he needed IVF for elevated creat. His creat is basically back to his normal even though it is slitghly elevated. He states that he drinks 4 bottles of water a day and sweet tea.  I asked how often he urinates and he said for about a month it is hard to start his flow of urine and basically he only urinates once a day. I asked dr. Janese Banks what she feels we should do and she sais he needs referrral to urology. Because the patient did  Not need fluids today he went home and I called him later in the evening and let him know that we have made a referral to the urology office and I would notify him when I get an appt for him

## 2018-10-01 ENCOUNTER — Other Ambulatory Visit: Payer: Self-pay | Admitting: Pain Medicine

## 2018-10-05 ENCOUNTER — Encounter: Payer: Self-pay | Admitting: *Deleted

## 2018-10-07 ENCOUNTER — Other Ambulatory Visit
Admission: RE | Admit: 2018-10-07 | Discharge: 2018-10-07 | Disposition: A | Discharge status: Home / Self Care | Payer: No Typology Code available for payment source | Source: Ambulatory Visit | Attending: Pain Medicine | Admitting: Pain Medicine

## 2018-10-07 ENCOUNTER — Other Ambulatory Visit: Payer: Self-pay

## 2018-10-07 DIAGNOSIS — Z1159 Encounter for screening for other viral diseases: Secondary | ICD-10-CM | POA: Insufficient documentation

## 2018-10-08 LAB — NOVEL CORONAVIRUS, NAA (HOSP ORDER, SEND-OUT TO REF LAB; TAT 18-24 HRS): SARS-CoV-2, NAA: NOT DETECTED

## 2018-10-09 ENCOUNTER — Other Ambulatory Visit: Admission: RE | Admit: 2018-10-09 | Payer: No Typology Code available for payment source | Source: Ambulatory Visit

## 2018-10-12 ENCOUNTER — Encounter: Payer: No Typology Code available for payment source | Admitting: Nurse Practitioner

## 2018-10-13 ENCOUNTER — Ambulatory Visit
Admission: RE | Admit: 2018-10-13 | Discharge: 2018-10-13 | Disposition: A | Discharge status: Home / Self Care | Payer: No Typology Code available for payment source | Source: Ambulatory Visit | Attending: Pain Medicine | Admitting: Pain Medicine

## 2018-10-13 ENCOUNTER — Other Ambulatory Visit: Payer: Self-pay

## 2018-10-13 ENCOUNTER — Encounter: Payer: Self-pay | Admitting: Pain Medicine

## 2018-10-13 ENCOUNTER — Ambulatory Visit (HOSPITAL_BASED_OUTPATIENT_CLINIC_OR_DEPARTMENT_OTHER): Payer: No Typology Code available for payment source | Admitting: Pain Medicine

## 2018-10-13 VITALS — BP 155/94 | HR 73 | Temp 98.4°F | Resp 18 | Ht 67.0 in | Wt 145.0 lb

## 2018-10-13 DIAGNOSIS — M5137 Other intervertebral disc degeneration, lumbosacral region: Secondary | ICD-10-CM | POA: Insufficient documentation

## 2018-10-13 DIAGNOSIS — G8929 Other chronic pain: Secondary | ICD-10-CM | POA: Insufficient documentation

## 2018-10-13 DIAGNOSIS — M545 Low back pain: Secondary | ICD-10-CM

## 2018-10-13 DIAGNOSIS — M47816 Spondylosis without myelopathy or radiculopathy, lumbar region: Secondary | ICD-10-CM | POA: Diagnosis present

## 2018-10-13 DIAGNOSIS — M51379 Other intervertebral disc degeneration, lumbosacral region without mention of lumbar back pain or lower extremity pain: Secondary | ICD-10-CM | POA: Insufficient documentation

## 2018-10-13 MED ORDER — TRIAMCINOLONE ACETONIDE 40 MG/ML IJ SUSP
80.0000 mg | Freq: Once | INTRAMUSCULAR | Status: AC
  Administered 2018-10-13: 40 mg
  Filled 2018-10-13: qty 2

## 2018-10-13 MED ORDER — ROPIVACAINE HCL 2 MG/ML IJ SOLN
18.0000 mL | Freq: Once | INTRAMUSCULAR | Status: AC
  Administered 2018-10-13: 10 mL via PERINEURAL
  Filled 2018-10-13: qty 20

## 2018-10-13 MED ORDER — MIDAZOLAM HCL 5 MG/5ML IJ SOLN
1.0000 mg | INTRAMUSCULAR | Status: DC | PRN
  Filled 2018-10-13: qty 5

## 2018-10-13 MED ORDER — LIDOCAINE HCL 2 % IJ SOLN
20.0000 mL | Freq: Once | INTRAMUSCULAR | Status: AC
  Administered 2018-10-13: 400 mg
  Filled 2018-10-13: qty 20

## 2018-10-13 MED ORDER — FENTANYL CITRATE (PF) 100 MCG/2ML IJ SOLN
25.0000 ug | INTRAMUSCULAR | Status: DC | PRN
  Filled 2018-10-13: qty 2

## 2018-10-13 MED ORDER — LACTATED RINGERS IV SOLN: 1000.0000 mL | Freq: Once | INTRAVENOUS | Status: DC

## 2018-10-13 NOTE — Patient Instructions (Addendum)
____________________________________________________________________________________________  Post-Procedure Discharge Instructions  Instructions:  Apply ice:   Purpose: This will minimize any swelling and discomfort after procedure.   When: Day of procedure, as soon as you get home.  How: Fill a plastic sandwich bag with crushed ice. Cover it with a small towel and apply to injection site.  How long: (15 min on, 15 min off) Apply for 15 minutes then remove x 15 minutes.  Repeat sequence on day of procedure, until you go to bed.  Apply heat:   Purpose: To treat any soreness and discomfort from the procedure.  When: Starting the next day after the procedure.  How: Apply heat to procedure site starting the day following the procedure.  How long: May continue to repeat daily, until discomfort goes away.  Food intake: Start with clear liquids (like water) and advance to regular food, as tolerated.   Physical activities: Keep activities to a minimum for the first 8 hours after the procedure. After that, then as tolerated.  Driving: If you have received any sedation, be responsible and do not drive. You are not allowed to drive for 24 hours after having sedation.  Blood thinner: (Applies only to those taking blood thinners) You may restart your blood thinner 6 hours after your procedure.  Insulin: (Applies only to Diabetic patients taking insulin) As soon as you can eat, you may resume your normal dosing schedule.  Infection prevention: Keep procedure site clean and dry. Shower daily and clean area with soap and water.  Post-procedure Pain Diary: Extremely important that this be done correctly and accurately. Recorded information will be used to determine the next step in treatment. For the purpose of accuracy, follow these rules:  Evaluate only the area treated. Do not report or include pain from an untreated area. For the purpose of this evaluation, ignore all other areas of pain,  except for the treated area.  After your procedure, avoid taking a long nap and attempting to complete the pain diary after you wake up. Instead, set your alarm clock to go off every hour, on the hour, for the initial 8 hours after the procedure. Document the duration of the numbing medicine, and the relief you are getting from it.  Do not go to sleep and attempt to complete it later. It will not be accurate. If you received sedation, it is likely that you were given a medication that may cause amnesia. Because of this, completing the diary at a later time may cause the information to be inaccurate. This information is needed to plan your care.  Follow-up appointment: Keep your post-procedure follow-up evaluation appointment after the procedure (usually 2 weeks for most procedures, 6 weeks for radiofrequencies). DO NOT FORGET to bring you pain diary with you.   Expect: (What should I expect to see with my procedure?)  From numbing medicine (AKA: Local Anesthetics): Numbness or decrease in pain. You may also experience some weakness, which if present, could last for the duration of the local anesthetic.  Onset: Full effect within 15 minutes of injected.  Duration: It will depend on the type of local anesthetic used. On the average, 1 to 8 hours.   From steroids (Applies only if steroids were used): Decrease in swelling or inflammation. Once inflammation is improved, relief of the pain will follow.  Onset of benefits: Depends on the amount of swelling present. The more swelling, the longer it will take for the benefits to be seen. In some cases, up to 10 days.    Duration: Steroids will stay in the system x 2 weeks. Duration of benefits will depend on multiple posibilities including persistent irritating factors.  Side-effects: If present, they may typically last 2 weeks (the duration of the steroids).  Frequent: Cramps (if they occur, drink Gatorade and take over-the-counter Magnesium 450-500 mg  once to twice a day); water retention with temporary weight gain; increases in blood sugar; decreased immune system response; increased appetite.  Occasional: Facial flushing (red, warm cheeks); mood swings; menstrual changes.  Uncommon: Long-term decrease or suppression of natural hormones; bone thinning. (These are more common with higher doses or more frequent use. This is why we prefer that our patients avoid having any injection therapies in other practices.)   Very Rare: Severe mood changes; psychosis; aseptic necrosis.  From procedure: Some discomfort is to be expected once the numbing medicine wears off. This should be minimal if ice and heat are applied as instructed.  Call if: (When should I call?)  You experience numbness and weakness that gets worse with time, as opposed to wearing off.  New onset bowel or bladder incontinence. (Applies only to procedures done in the spine)  Emergency Numbers:  Durning business hours (Monday - Thursday, 8:00 AM - 4:00 PM) (Friday, 9:00 AM - 12:00 Noon): [phone]  After hours: [phone]  NOTE: If you are having a problem and are unable connect with, or to talk to a provider, then go to your nearest urgent care or emergency department. If the problem is serious and urgent, please call 911. ____________________________________________________________________________________________ Please complete your Post Procedure Diary and bring it to your follow-up appointment. 

## 2018-10-13 NOTE — Progress Notes (Signed)
Patient's Name: James House.  MRN: [id number]  Referring Provider: Pleas Koch, NP  DOB: [birth year]  PCP: Pleas Koch, NP  DOS: 10/13/2018  Note by: Gaspar Cola, MD  Service setting: Ambulatory outpatient  Specialty: Interventional Pain Management  Patient type: Established  Location: [facility] (AMB) Pain Management Facility  Visit type: Interventional Procedure   Primary Reason for Visit: Interventional Pain Management Treatment. CC: Back Pain (lumbar bilateral )  Procedure:          Anesthesia, Analgesia, Anxiolysis:  Type: Lumbar Facet, Medial Branch Block(s) #2  Primary Purpose: Diagnostic Region: Posterolateral Lumbosacral Spine Level: L2, L3, L4, L5, & S1 Medial Branch Level(s). Injecting these levels blocks the L3-4, L4-5, and L5-S1 lumbar facet joints. Laterality: Bilateral  Type: Local Anesthesia Indication(s): Analgesia         Route: Infiltration ([place]/IM) IV Access: Declined Sedation: Declined  Local Anesthetic: Lidocaine 1-2%  Position: Prone   Indications: 1. Lumbar facet syndrome (Bilateral) (L>R)   2. Spondylosis without myelopathy or radiculopathy, lumbar region   3. Chronic low back pain (Primary Area of Pain) (Bilateral) (L>R) w/o sciatica   4. DDD (degenerative disc disease), lumbosacral    Pain Score: Pre-procedure: 6 /10 Post-procedure: 4 /10  Pre-op Assessment:  James House is a 48 y.o. (year old), male patient, seen today for interventional treatment. He  has a past surgical history that includes Joint replacement; Knee surgery (Left); and Abdominal surgery. James House has a current medication list which includes the following prescription(s): acyclovir, aspirin ec, baclofen, duloxetine, gabapentin, lenalidomide, oxycodone, oxycodone, pregabalin, albuterol, and oxycodone, and the following Facility-Administered Medications: fentanyl, lactated ringers, and midazolam. His primarily concern today is the Back Pain (lumbar bilateral )  Initial Vital  Signs:  Pulse/HCG Rate: 73ECG Heart Rate: 61 Temp: 98.4 F (36.9 C) Resp: 16 BP: 131/81 SpO2: 100 %  BMI: Estimated body mass index is 22.71 kg/m as calculated from the following:   Height as of this encounter: 5\' 7"  (1.702 m).   Weight as of this encounter: 145 lb (65.8 kg).  Risk Assessment: Allergies: Reviewed. He has No Known Allergies.  Allergy Precautions: None required Coagulopathies: Reviewed. None identified.  Blood-thinner therapy: None at this time Active Infection(s): Reviewed. None identified. James House is afebrile  Site Confirmation: James House was asked to confirm the procedure and laterality before marking the site Procedure checklist: Completed Consent: Before the procedure and under the influence of no sedative(s), amnesic(s), or anxiolytics, the patient was informed of the treatment options, risks and possible complications. To fulfill our ethical and legal obligations, as recommended by the American Medical Association's Code of Ethics, I have informed the patient of my clinical impression; the nature and purpose of the treatment or procedure; the risks, benefits, and possible complications of the intervention; the alternatives, including doing nothing; the risk(s) and benefit(s) of the alternative treatment(s) or procedure(s); and the risk(s) and benefit(s) of doing nothing. The patient was provided information about the general risks and possible complications associated with the procedure. These may include, but are not limited to: failure to achieve desired goals, infection, bleeding, organ or nerve damage, allergic reactions, paralysis, and death. In addition, the patient was informed of those risks and complications associated to Spine-related procedures, such as failure to decrease pain; infection (i.e.: Meningitis, epidural or intraspinal abscess); bleeding (i.e.: epidural hematoma, subarachnoid hemorrhage, or any other type of intraspinal or peri-dural bleeding);  organ or nerve damage (i.e.: Any type of peripheral nerve, nerve root,  or spinal cord injury) with subsequent damage to sensory, motor, and/or autonomic systems, resulting in permanent pain, numbness, and/or weakness of one or several areas of the body; allergic reactions; (i.e.: anaphylactic reaction); and/or death. Furthermore, the patient was informed of those risks and complications associated with the medications. These include, but are not limited to: allergic reactions (i.e.: anaphylactic or anaphylactoid reaction(s)); adrenal axis suppression; blood sugar elevation that in diabetics may result in ketoacidosis or comma; water retention that in patients with history of congestive heart failure may result in shortness of breath, pulmonary edema, and decompensation with resultant heart failure; weight gain; swelling or edema; medication-induced neural toxicity; particulate matter embolism and blood vessel occlusion with resultant organ, and/or nervous system infarction; and/or aseptic necrosis of one or more joints. Finally, the patient was informed that Medicine is not an exact science; therefore, there is also the possibility of unforeseen or unpredictable risks and/or possible complications that may result in a catastrophic outcome. The patient indicated having understood very clearly. We have given the patient no guarantees and we have made no promises. Enough time was given to the patient to ask questions, all of which were answered to the patient's satisfaction. James House has indicated that he wanted to continue with the procedure. Attestation: I, the ordering provider, attest that I have discussed with the patient the benefits, risks, side-effects, alternatives, likelihood of achieving goals, and potential problems during recovery for the procedure that I have provided informed consent. Date   Time: 10/13/2018  9:57 AM  Pre-Procedure Preparation:  Monitoring: As per clinic protocol. Respiration,  ETCO2, SpO2, BP, heart rate and rhythm monitor placed and checked for adequate function Safety Precautions: Patient was assessed for positional comfort and pressure points before starting the procedure. Time-out: I initiated and conducted the "Time-out" before starting the procedure, as per protocol. The patient was asked to participate by confirming the accuracy of the "Time Out" information. Verification of the correct person, site, and procedure were performed and confirmed by me, the nursing staff, and the patient. "Time-out" conducted as per Joint Commission's Universal Protocol (UP.01.01.01). Time: 1045  Description of Procedure:          Laterality: Bilateral. The procedure was performed in identical fashion on both sides. Levels:  L2, L3, L4, L5, & S1 Medial Branch Level(s) Area Prepped: Posterior Lumbosacral Region Prepping solution: DuraPrep (Iodine Povacrylex [0.7% available iodine] and Isopropyl Alcohol, 74% w/w) Safety Precautions: Aspiration looking for blood return was conducted prior to all injections. At no point did we inject any substances, as a needle was being advanced. Before injecting, the patient was told to immediately notify me if he was experiencing any new onset of "ringing in the ears, or metallic taste in the mouth". No attempts were made at seeking any paresthesias. Safe injection practices and needle disposal techniques used. Medications properly checked for expiration dates. SDV (single dose vial) medications used. After the completion of the procedure, all disposable equipment used was discarded in the proper designated medical waste containers. Local Anesthesia: Protocol guidelines were followed. The patient was positioned over the fluoroscopy table. The area was prepped in the usual manner. The time-out was completed. The target area was identified using fluoroscopy. A 12-in long, straight, sterile hemostat was used with fluoroscopic guidance to locate the targets for  each level blocked. Once located, the skin was marked with an approved surgical skin marker. Once all sites were marked, the skin (epidermis, dermis, and hypodermis), as well as deeper tissues (fat, connective  tissue and muscle) were infiltrated with a small amount of a short-acting local anesthetic, loaded on a 10cc syringe with a 25G, 1.5-in  Needle. An appropriate amount of time was allowed for local anesthetics to take effect before proceeding to the next step. Local Anesthetic: Lidocaine 2.0% The unused portion of the local anesthetic was discarded in the proper designated containers. Technical explanation of process:  L2 Medial Branch Nerve Block (MBB): The target area for the L2 medial branch is at the junction of the postero-lateral aspect of the superior articular process and the superior, posterior, and medial edge of the transverse process of L3. Under fluoroscopic guidance, a Quincke needle was inserted until contact was made with os over the superior postero-lateral aspect of the pedicular shadow (target area). After negative aspiration for blood, 0.5 mL of the nerve block solution was injected without difficulty or complication. The needle was removed intact. L3 Medial Branch Nerve Block (MBB): The target area for the L3 medial branch is at the junction of the postero-lateral aspect of the superior articular process and the superior, posterior, and medial edge of the transverse process of L4. Under fluoroscopic guidance, a Quincke needle was inserted until contact was made with os over the superior postero-lateral aspect of the pedicular shadow (target area). After negative aspiration for blood, 0.5 mL of the nerve block solution was injected without difficulty or complication. The needle was removed intact. L4 Medial Branch Nerve Block (MBB): The target area for the L4 medial branch is at the junction of the postero-lateral aspect of the superior articular process and the superior, posterior, and  medial edge of the transverse process of L5. Under fluoroscopic guidance, a Quincke needle was inserted until contact was made with os over the superior postero-lateral aspect of the pedicular shadow (target area). After negative aspiration for blood, 0.5 mL of the nerve block solution was injected without difficulty or complication. The needle was removed intact. L5 Medial Branch Nerve Block (MBB): The target area for the L5 medial branch is at the junction of the postero-lateral aspect of the superior articular process and the superior, posterior, and medial edge of the sacral ala. Under fluoroscopic guidance, a Quincke needle was inserted until contact was made with os over the superior postero-lateral aspect of the pedicular shadow (target area). After negative aspiration for blood, 0.5 mL of the nerve block solution was injected without difficulty or complication. The needle was removed intact. S1 Medial Branch Nerve Block (MBB): The target area for the S1 medial branch is at the posterior and inferior 6 o'clock position of the L5-S1 facet joint. Under fluoroscopic guidance, the Quincke needle inserted for the L5 MBB was redirected until contact was made with os over the inferior and postero aspect of the sacrum, at the 6 o' clock position under the L5-S1 facet joint (Target area). After negative aspiration for blood, 0.5 mL of the nerve block solution was injected without difficulty or complication. The needle was removed intact.  Nerve block solution: 0.2% PF-Ropivacaine + Triamcinolone (40 mg/mL) diluted to a final concentration of 4 mg of Triamcinolone/mL of Ropivacaine The unused portion of the solution was discarded in the proper designated containers. Procedural Needles: 22-gauge, 3.5-inch, Quincke needles used for all levels.  Once the entire procedure was completed, the treated area was cleaned, making sure to leave some of the prepping solution back to take advantage of its long term  bactericidal properties.   Illustration of the posterior view of the lumbar spine and the  posterior neural structures. Laminae of L2 through S1 are labeled. DPRL5, dorsal primary ramus of L5; DPRS1, dorsal primary ramus of S1; DPR3, dorsal primary ramus of L3; FJ, facet (zygapophyseal) joint L3-L4; I, inferior articular process of L4; LB1, lateral branch of dorsal primary ramus of L1; IAB, inferior articular branches from L3 medial branch (supplies L4-L5 facet joint); IBP, intermediate branch plexus; MB3, medial branch of dorsal primary ramus of L3; NR3, third lumbar nerve root; S, superior articular process of L5; SAB, superior articular branches from L4 (supplies L4-5 facet joint also); TP3, transverse process of L3.  Vitals:   10/13/18 1050 10/13/18 1055 10/13/18 1101 10/13/18 1107  BP: (!) 144/100 (!) 144/102 (!) 156/105 (!) 155/94  Pulse:      Resp: 18 20 18    Temp:      TempSrc:      SpO2: 100% 100% 100%   Weight:      Height:         Start Time: 1045 hrs. End Time: 1057 hrs.  Imaging Guidance (Spinal):          Type of Imaging Technique: Fluoroscopy Guidance (Spinal) Indication(s): Assistance in needle guidance and placement for procedures requiring needle placement in or near specific anatomical locations not easily accessible without such assistance. Exposure Time: Please see nurses notes. Contrast: None used. Fluoroscopic Guidance: I was personally present during the use of fluoroscopy. "Tunnel Vision Technique" used to obtain the best possible view of the target area. Parallax error corrected before commencing the procedure. "Direction-depth-direction" technique used to introduce the needle under continuous pulsed fluoroscopy. Once target was reached, antero-posterior, oblique, and lateral fluoroscopic projection used confirm needle placement in all planes. Images permanently stored in EMR. Interpretation: No contrast injected. I personally interpreted the imaging  intraoperatively. Adequate needle placement confirmed in multiple planes. Permanent images saved into the patient's record.  Antibiotic Prophylaxis:   Anti-infectives (From admission, onward)   None     Indication(s): None identified  Post-operative Assessment:  Post-procedure Vital Signs:  Pulse/HCG Rate: 7364 Temp: 98.4 F (36.9 C) Resp: 18 BP: (!) 155/94 SpO2: 100 %  EBL: None  Complications: No immediate post-treatment complications observed by team, or reported by patient.  Note: The patient tolerated the entire procedure well. A repeat set of vitals were taken after the procedure and the patient was kept under observation following institutional policy, for this type of procedure. Post-procedural neurological assessment was performed, showing return to baseline, prior to discharge. The patient was provided with post-procedure discharge instructions, including a section on how to identify potential problems. Should any problems arise concerning this procedure, the patient was given instructions to immediately contact us, at any time, without hesitation. In any case, we plan to contact the patient by telephone for a follow-up status report regarding this interventional procedure.  Comments:  No additional relevant information.  Plan of Care  Orders:  Orders Placed This Encounter  Procedures   LUMBAR FACET(MEDIAL BRANCH NERVE BLOCK) MBNB    Scheduling Instructions:     Side: Bilateral     Level: L3-4, L4-5, & L5-S1 Facets (L2, L3, L4, L5, & S1 Medial Branch Nerves)     Sedation: With Sedation.     Timeframe: Today    Order Specific Question:   Where will this procedure be performed?    Answer:   [facility]   DG PAIN [facility] C-ARM 1-60 MIN NO REPORT    Intraoperative interpretation by procedural physician at [place].  Standing Status:   Standing    Number of Occurrences:   1    Order Specific Question:   Reason for exam:    Answer:    Assistance in needle guidance and placement for procedures requiring needle placement in or near specific anatomical locations not easily accessible without such assistance.   Provider attestation of informed consent for procedure/surgical case    I, the ordering provider, attest that I have discussed with the patient the benefits, risks, side effects, alternatives, likelihood of achieving goals and potential problems during recovery for the procedure that I have provided informed consent.    Standing Status:   Standing    Number of Occurrences:   1   Informed Consent Details: Transcribe to consent form and obtain patient signature    Standing Status:   Standing    Number of Occurrences:   1    Order Specific Question:   Procedure    Answer:   Bilateral Lumbar facet block (medial branch block) under fluoroscopic guidance. (See notes for levels.)    Order Specific Question:   Surgeon    Answer:   Sereen Schaff A. Dossie Arbour, MD    Order Specific Question:   Indication/Reason    Answer:   Bilateral low back pain with or without lower extremity pain   Medications ordered for procedure: Meds ordered this encounter  Medications   lidocaine (XYLOCAINE) 2 % (with pres) injection 400 mg   lactated ringers infusion 1,000 mL   midazolam (VERSED) 5 MG/5ML injection 1-2 mg    Make sure Flumazenil is available in the pyxis when using this medication. If oversedation occurs, administer 0.2 mg IV over 15 sec. If after 45 sec no response, administer 0.2 mg again over 1 min; may repeat at 1 min intervals; not to exceed 4 doses (1 mg)   fentaNYL (SUBLIMAZE) injection 25-50 mcg    Make sure Narcan is available in the pyxis when using this medication. In the event of respiratory depression (RR< 8/min): Titrate NARCAN (naloxone) in increments of 0.1 to 0.2 mg IV at 2-3 minute intervals, until desired degree of reversal.   ropivacaine (PF) 2 mg/mL (0.2%) (NAROPIN) injection 18 mL   triamcinolone acetonide  (KENALOG-40) injection 80 mg   Medications administered: We administered lidocaine, ropivacaine (PF) 2 mg/mL (0.2%), and triamcinolone acetonide.  See the medical record for exact dosing, route, and time of administration.  Disposition: Discharge home  Discharge Date & Time: 10/13/2018; 1105 hrs.   Follow-up plan:   Return in about 2 weeks (around 10/27/2018) for (VV), E/M (PP).     Recent Visits Date Type Provider Dept  08/17/18 Office Visit Milinda Pointer, MD [facility]  07/20/18 Office Visit Vevelyn Francois, NP [facility] recent visits within past 90 days and meeting all other requirements   Today's Visits Date Type Provider Dept  10/13/18 Procedure visit Milinda Pointer, MD [facility]  Showing today's visits and meeting all other requirements   Future Appointments Date Type Provider Dept  11/16/18 Appointment Milinda Pointer, MD [facility]  Showing future appointments within next 90 days and meeting all other requirements   Primary Care Physician: Pleas Koch, NP Location: [facility] Note by: Gaspar Cola, MD Date: 10/13/2018; Time: 1:00 PM  Disclaimer:  Medicine is not an Chief Strategy Officer. The only guarantee in medicine is that nothing is guaranteed. It is important to note that the decision to proceed with this intervention  was based on the information collected from the patient. The Data and conclusions were drawn from the patient's questionnaire, the interview, and the physical examination. Because the information was provided in large part by the patient, it cannot be guaranteed that it has not been purposely or unconsciously manipulated. Every effort has been made to obtain as much relevant data as possible for this evaluation. It is important to note that the conclusions that lead to this procedure are derived in large part from the available data. Always take into account  that the treatment will also be dependent on availability of resources and existing treatment guidelines, considered by other Pain Management Practitioners as being common knowledge and practice, at the time of the intervention. For Medico-Legal purposes, it is also important to point out that variation in procedural techniques and pharmacological choices are the acceptable norm. The indications, contraindications, technique, and results of the above procedure should only be interpreted and judged by a Board-Certified Interventional Pain Specialist with extensive familiarity and expertise in the same exact procedure and technique.

## 2018-10-14 ENCOUNTER — Telehealth: Payer: Self-pay | Admitting: *Deleted

## 2018-10-14 MED FILL — GABAPENTIN 600 MG TABLET: 600 | 30 days supply | Qty: 90 | Fill #0

## 2018-10-14 NOTE — Telephone Encounter (Signed)
Spoke with patient verbalize no questions or concerns from procedure on yesterday.

## 2018-10-15 ENCOUNTER — Other Ambulatory Visit: Payer: Self-pay | Admitting: Oncology

## 2018-10-15 DIAGNOSIS — C9001 Multiple myeloma in remission: Secondary | ICD-10-CM

## 2018-10-16 MED FILL — oxyCODONE HCL 5 MG TABS: 5 | 30 days supply | Qty: 90 | Fill #0

## 2018-10-19 ENCOUNTER — Ambulatory Visit: Payer: No Typology Code available for payment source | Admitting: Oncology

## 2018-10-19 ENCOUNTER — Other Ambulatory Visit: Payer: No Typology Code available for payment source

## 2018-10-19 ENCOUNTER — Ambulatory Visit: Payer: No Typology Code available for payment source

## 2018-10-20 ENCOUNTER — Encounter: Payer: Self-pay | Admitting: Oncology

## 2018-10-20 ENCOUNTER — Other Ambulatory Visit: Payer: Self-pay | Admitting: *Deleted

## 2018-10-20 ENCOUNTER — Ambulatory Visit: Payer: No Typology Code available for payment source | Admitting: Urology

## 2018-10-20 ENCOUNTER — Other Ambulatory Visit: Payer: Self-pay | Admitting: Oncology

## 2018-10-20 ENCOUNTER — Encounter: Payer: Self-pay | Admitting: Urology

## 2018-10-20 DIAGNOSIS — C9001 Multiple myeloma in remission: Secondary | ICD-10-CM

## 2018-10-20 MED ORDER — LENALIDOMIDE 5 MG PO CAPS: ORAL_CAPSULE | ORAL | 0 refills | Status: DC

## 2018-10-22 ENCOUNTER — Inpatient Hospital Stay: Payer: No Typology Code available for payment source | Attending: Oncology

## 2018-10-22 ENCOUNTER — Encounter: Payer: Self-pay | Admitting: Oncology

## 2018-10-22 ENCOUNTER — Inpatient Hospital Stay: Payer: No Typology Code available for payment source

## 2018-10-22 ENCOUNTER — Ambulatory Visit
Admission: RE | Admit: 2018-10-22 | Discharge: 2018-10-22 | Disposition: A | Discharge status: Home / Self Care | Payer: No Typology Code available for payment source | Source: Ambulatory Visit | Attending: Oncology | Admitting: Oncology

## 2018-10-22 ENCOUNTER — Other Ambulatory Visit: Payer: Self-pay

## 2018-10-22 ENCOUNTER — Inpatient Hospital Stay (HOSPITAL_BASED_OUTPATIENT_CLINIC_OR_DEPARTMENT_OTHER): Payer: No Typology Code available for payment source | Admitting: Oncology

## 2018-10-22 VITALS — BP 133/88 | HR 62 | Temp 96.8°F | Resp 18 | Wt 144.2 lb

## 2018-10-22 DIAGNOSIS — Z299 Encounter for prophylactic measures, unspecified: Secondary | ICD-10-CM

## 2018-10-22 DIAGNOSIS — C9001 Multiple myeloma in remission: Secondary | ICD-10-CM | POA: Insufficient documentation

## 2018-10-22 DIAGNOSIS — F329 Major depressive disorder, single episode, unspecified: Secondary | ICD-10-CM

## 2018-10-22 DIAGNOSIS — Z79899 Other long term (current) drug therapy: Secondary | ICD-10-CM

## 2018-10-22 DIAGNOSIS — Z9481 Bone marrow transplant status: Secondary | ICD-10-CM

## 2018-10-22 DIAGNOSIS — Z9484 Stem cells transplant status: Secondary | ICD-10-CM | POA: Diagnosis not present

## 2018-10-22 DIAGNOSIS — Z7983 Long term (current) use of bisphosphonates: Secondary | ICD-10-CM

## 2018-10-22 DIAGNOSIS — R7989 Other specified abnormal findings of blood chemistry: Secondary | ICD-10-CM

## 2018-10-22 DIAGNOSIS — Z87891 Personal history of nicotine dependence: Secondary | ICD-10-CM

## 2018-10-22 LAB — CBC WITH DIFFERENTIAL/PLATELET
Abs Immature Granulocytes: 0.03 10*3/uL (ref 0.00–0.07)
Basophils Absolute: 0 10*3/uL (ref 0.0–0.1)
Basophils Relative: 0 %
Eosinophils Absolute: 0.2 10*3/uL (ref 0.0–0.5)
Eosinophils Relative: 4 %
HCT: 40 % (ref 39.0–52.0)
Hemoglobin: 13.8 g/dL (ref 13.0–17.0)
Immature Granulocytes: 1 %
Lymphocytes Relative: 13 %
Lymphs Abs: 0.7 10*3/uL (ref 0.7–4.0)
MCH: 34.2 pg — ABNORMAL HIGH (ref 26.0–34.0)
MCHC: 34.5 g/dL (ref 30.0–36.0)
MCV: 99 fL (ref 80.0–100.0)
Monocytes Absolute: 0.5 10*3/uL (ref 0.1–1.0)
Monocytes Relative: 9 %
Neutro Abs: 4 10*3/uL (ref 1.7–7.7)
Neutrophils Relative %: 73 %
Platelets: 145 10*3/uL — ABNORMAL LOW (ref 150–400)
RBC: 4.04 MIL/uL — ABNORMAL LOW (ref 4.22–5.81)
RDW: 13.3 % (ref 11.5–15.5)
WBC: 5.5 10*3/uL (ref 4.0–10.5)
nRBC: 0 % (ref 0.0–0.2)

## 2018-10-22 LAB — COMPREHENSIVE METABOLIC PANEL
ALT: 15 U/L (ref 0–44)
AST: 15 U/L (ref 15–41)
Albumin: 3.9 g/dL (ref 3.5–5.0)
Alkaline Phosphatase: 51 U/L (ref 38–126)
Anion gap: 6 (ref 5–15)
BUN: 25 mg/dL — ABNORMAL HIGH (ref 6–20)
CO2: 28 mmol/L (ref 22–32)
Calcium: 8.8 mg/dL — ABNORMAL LOW (ref 8.9–10.3)
Chloride: 105 mmol/L (ref 98–111)
Creatinine, Ser: 1.59 mg/dL — ABNORMAL HIGH (ref 0.61–1.24)
GFR calc Af Amer: 59 mL/min — ABNORMAL LOW (ref >60)
GFR calc non Af Amer: 51 mL/min — ABNORMAL LOW (ref >60)
Glucose, Bld: 98 mg/dL (ref 70–99)
Potassium: 4 mmol/L (ref 3.5–5.1)
Sodium: 139 mmol/L (ref 135–145)
Total Bilirubin: 0.8 mg/dL (ref 0.3–1.2)
Total Protein: 6.7 g/dL (ref 6.5–8.1)

## 2018-10-22 MED ORDER — DENOSUMAB 120 MG/1.7ML ~~LOC~~ SOLN
120.0000 mg | Freq: Once | SUBCUTANEOUS | Status: AC
  Administered 2018-10-22: 120 mg via SUBCUTANEOUS
  Filled 2018-10-22: qty 1.7

## 2018-10-22 NOTE — Progress Notes (Signed)
Patient here for follow up. Complains of pain 5/10 to back

## 2018-10-22 NOTE — Progress Notes (Signed)
Hematology/Oncology Consult note [facility]  [phone] [phone]  Patient Care Team: Pleas Koch, NP as PCP - General (Internal Medicine) Vella Redhead, MD as PCP - Hematology/Oncology   Name of the patient: James House  [id number]  02/14/1971   Date of visit: 10/22/18  Diagnosis- light chain kappa multiple myeloma s/p ASCT currently in remission. Patient on maintenance revlimid  Chief complaint/ Reason for visit-routine follow-up of myeloma on Revlimid and fever  Heme/Onc history:  Patient is a 48 year old male with a history of kappa light chain myeloma that was treated by [name]. History is as follows:  1. Patient presented with renal insufficiency with a creatinine of 2.4 in April 2018. Kidney biopsy showed myeloma cast nephropathy. Kappa light chain was 3004 and lambda 0.22 with a kappa lambda ratio of 13,000 655. Skeletal survey showed multiple calvarial and marrow lesions. Bone marrow biopsy in May 2018 showed 43% plasma cells. He received CyBorDfor cycle 1 in May 2018 and was subsequently switched to RVD. He received 4 cycles followed by a repeat bone marrow biopsy in August 2018 which showed no increase in plasma cells.  2.He underwent autologous stem cell transplantation on 01/30/2017. He was started on Revlimid maintenance 10 mg daily in January 2019.  3.Labs from January February and March 2019 done at [facility] showed no M spike, normal kappa lambda ratio of 1.06. He was last seen by them in April 2019.  Following that patient was admitted to [facility] for healthcare associated pneumonia and was recently discharged. He wished to transfer his care to [place] at this time.  With regards to his myeloma patient is currently on 10 mg of Revlimid daily. He is also on acyclovir 400 mg twice daily which he is to continue for a minimum of 1 year post transplant. Patient has chronic back pain as well as  chemo-induced peripheral neuropathy for which he was seen by [name] in the past and is currently seeing [facility] pain clinic and is under pain contract with them  Interval history-he recently underwent nerve block for his back pain and his it has been helping him some.  But he continues to feel fatigued and reports that when he wakes up in the morning he feels exhausted despite not going to work.  There are times when he feels depressed.  He tried taking Cymbalta in the past but reports that it made him drowsy and he has not been taking it.  ECOG PS- 1 Pain scale- 5 Opioid associated constipation- no  Review of systems- Review of Systems  Constitutional: Positive for malaise/fatigue. Negative for chills, fever and weight loss.  HENT: Negative for congestion, ear discharge and nosebleeds.   Eyes: Negative for blurred vision.  Respiratory: Negative for cough, hemoptysis, sputum production, shortness of breath and wheezing.   Cardiovascular: Negative for chest pain, palpitations, orthopnea and claudication.  Gastrointestinal: Negative for abdominal pain, blood in stool, constipation, diarrhea, heartburn, melena, nausea and vomiting.  Genitourinary: Negative for dysuria, flank pain, frequency, hematuria and urgency.  Musculoskeletal: Positive for back pain. Negative for joint pain and myalgias.  Skin: Negative for rash.  Neurological: Negative for dizziness, tingling, focal weakness, seizures, weakness and headaches.  Endo/Heme/Allergies: Does not bruise/bleed easily.  Psychiatric/Behavioral: Positive for depression. Negative for suicidal ideas. The patient does not have insomnia.        No Known Allergies   Past Medical History:  Diagnosis Date  . CKD (chronic kidney disease) stage  3, GFR 30-59 ml/min (HCC)   . Hypertension   . Multiple myeloma ([place]) 08/11/2017  . Neuropathy   . Pneumonia      Past Surgical History:  Procedure Laterality Date  . ABDOMINAL SURGERY    . JOINT  REPLACEMENT     right knee  . KNEE SURGERY Left     Social History   Socioeconomic History  . Marital status: Married    Spouse name: [name]  . Number of children: 3  . Years of education: Not on file  . Highest education level: Not on file  Occupational History  . Not on file  Social Needs  . Financial resource strain: Not hard at all  . Food insecurity    Worry: Never true    Inability: Never true  . Transportation needs    Medical: No    Non-medical: No  Tobacco Use  . Smoking status: Former Smoker    Quit date: 08/19/2013    Years since quitting: 5.1  . Smokeless tobacco: Current User    Types: Snuff  Substance and Sexual Activity  . Alcohol use: No  . Drug use: No  . Sexual activity: Yes  Lifestyle  . Physical activity    Days per week: 7 days    Minutes per session: 30 min  . Stress: Only a little  Relationships  . Social Herbalist on phone: Once a week    Gets together: Once a week    Attends religious service: More than 4 times per year    Active member of club or organization: No    Attends meetings of clubs or organizations: Never    Relationship status: Married  . Intimate partner violence    Fear of current or ex partner: No    Emotionally abused: No    Physically abused: No    Forced sexual activity: No  Other Topics Concern  . Not on file  Social History Narrative   Live in private residence with spouse and mother in law    Family History  Problem Relation Age of Onset  . Cancer Mother 87       Pancreatic  . COPD Mother   . Diabetes Mother   . Hyperlipidemia Mother   . Hypertension Mother   . Cancer Maternal Uncle        pancreatic  . Cancer Maternal Grandmother 74       colon  . COPD Maternal Grandmother   . Diabetes Maternal Grandmother   . Hypertension Maternal Grandmother      Current Outpatient Medications:  .  aspirin EC 81 MG tablet, Take 81 mg by mouth daily., Disp: , Rfl:  .  DULoxetine (CYMBALTA) 30 MG  capsule, Take 1 capsule (30 mg total) by mouth as directed. 1 capsule daily x 7 days and then increase to 2 capsules daily for the rest of the month, Disp: 53 capsule, Rfl: 0 .  gabapentin (NEURONTIN) 600 MG tablet, Take 1 tablet (600 mg total) by mouth 3 (three) times daily., Disp: 90 tablet, Rfl: 2 .  lenalidomide (REVLIMID) 5 MG capsule, TAKE 1 CAPSULE BY MOUTH DAILY, Disp: 28 capsule, Rfl: 0 .  oxyCODONE (OXY IR/ROXICODONE) 5 MG immediate release tablet, Take 1 tablet (5 mg total) by mouth every 8 (eight) hours as needed for up to 30 days for severe pain. Must last 30 days., Disp: 90 tablet, Rfl: 0 .  pregabalin (LYRICA) 150 MG capsule, Take 1 capsule (150 mg  total) by mouth 3 (three) times daily., Disp: 90 capsule, Rfl: 2 .  acyclovir (ZOVIRAX) 200 MG capsule, Take 200 mg by mouth 2 (two) times daily. , Disp: , Rfl:  .  albuterol (PROVENTIL HFA;VENTOLIN HFA) 108 (90 Base) MCG/ACT inhaler, Inhale 2 puffs into the lungs every 6 (six) hours as needed for wheezing or shortness of breath. (Patient not taking: Reported on 10/22/2018), Disp: 1 Inhaler, Rfl: 0 .  baclofen (LIORESAL) 10 MG tablet, Take 1-2 tablets (10-20 mg total) by mouth 4 (four) times daily., Disp: 240 tablet, Rfl: 2 .  oxyCODONE (OXY IR/ROXICODONE) 5 MG immediate release tablet, Take 1 tablet (5 mg total) by mouth every 8 (eight) hours as needed for up to 30 days for severe pain. Must last 30 days., Disp: 90 tablet, Rfl: 0 .  oxyCODONE (OXY IR/ROXICODONE) 5 MG immediate release tablet, Take 1 tablet (5 mg total) by mouth every 8 (eight) hours as needed for up to 30 days for severe pain. Must last 30 days., Disp: 90 tablet, Rfl: 0 No current facility-administered medications for this visit.   Facility-Administered Medications Ordered in Other Visits:  .  denosumab (XGEVA) injection 120 mg, 120 mg, Subcutaneous, Once, Sindy Guadeloupe, MD  Physical exam:  Vitals:   10/22/18 0858  BP: 133/88  Pulse: 62  Resp: 18  Temp: (!) 96.8 F (36  C)  Weight: 144 lb 3.2 oz (65.4 kg)   Physical Exam Constitutional:      General: He is not in acute distress. HENT:     Head: Normocephalic and atraumatic.  Eyes:     Pupils: Pupils are equal, round, and reactive to light.  Neck:     Musculoskeletal: Normal range of motion.  Cardiovascular:     Rate and Rhythm: Normal rate and regular rhythm.     Heart sounds: Normal heart sounds.  Pulmonary:     Effort: Pulmonary effort is normal.     Breath sounds: Normal breath sounds.  Abdominal:     General: Bowel sounds are normal.     Palpations: Abdomen is soft.  Skin:    General: Skin is warm and dry.  Neurological:     Mental Status: He is alert and oriented to person, place, and time.      CMP Latest Ref Rng & Units 10/22/2018  Glucose 70 - 99 mg/dL 98  BUN 6 - 20 mg/dL 25(H)  Creatinine 0.61 - 1.24 mg/dL 1.59(H)  Sodium 135 - 145 mmol/L 139  Potassium 3.5 - 5.1 mmol/L 4.0  Chloride 98 - 111 mmol/L 105  CO2 22 - 32 mmol/L 28  Calcium 8.9 - 10.3 mg/dL 8.8(L)  Total Protein 6.5 - 8.1 g/dL 6.7  Total Bilirubin 0.3 - 1.2 mg/dL 0.8  Alkaline Phos 38 - 126 U/L 51  AST 15 - 41 U/L 15  ALT 0 - 44 U/L 15   CBC Latest Ref Rng & Units 10/22/2018  WBC 4.0 - 10.5 K/uL 5.5  Hemoglobin 13.0 - 17.0 g/dL 13.8  Hematocrit 39.0 - 52.0 % 40.0  Platelets 150 - 400 K/uL 145(L)    No images are attached to the encounter.  [facility] C-arm 1-60 Min No Report  Result Date: 10/19/2018 Fluoro was used, but no Radiologist interpretation will be provided. Please refer to "NOTES" tab for provider progress note.    Assessment and plan- Patient is a 48 y.o. male  with kappa light chain multiple myeloma status post RVD chemotherapy followed by autologous stem cell  transplantation in August 2018 currently in remission and on maintenance Revlimid.  He is here for following issues:  1.  Multiple myeloma: Currently continues to remain in remission and is on maintenance Revlimid 5 mg daily.  Serum  creatinine stable around 1.5.  Myeloma labs from today are pending.  Labs from April did not show any evidence of M protein and a normal kappa lambda light chain ratio.  We will obtain a bone survey at this time.  Continue Revlimid.  2.  Patient is also on monthly Xgeva for his multiple myeloma which he will continue.  Delton See today and Xgeva again in 1 month.  I will see him back in 2 months with CBC with differential, CMP, myeloma panel and serum free light chains.  3.  Symptoms of depression: Patient said he could not tolerate Cymbalta due to increased drowsiness.  He is on gabapentin, Lyrica as well as pain medication through pain clinic.  I will get in touch with Dr. Dossie Arbour to see if he would be okay with me adding an antidepressant in addition to all the medications that he is on to see if it helps him with his depression symptoms.   Visit Diagnosis 1. Multiple myeloma in remission ([place])   2. High risk medication use   3. Long term (current) use of bisphosphonates      Dr. Randa Evens, MD, MPH [facility] [place] [phone] 10/22/2018 9:31 AM

## 2018-10-23 LAB — KAPPA/LAMBDA LIGHT CHAINS
Kappa free light chain: 12.4 mg/L (ref 3.3–19.4)
Kappa, lambda light chain ratio: 1.41 (ref 0.26–1.65)
Lambda free light chains: 8.8 mg/L (ref 5.7–26.3)

## 2018-10-26 LAB — MULTIPLE MYELOMA PANEL, SERUM
Albumin SerPl Elph-Mcnc: 3.6 g/dL (ref 2.9–4.4)
Albumin/Glob SerPl: 1.6 (ref 0.7–1.7)
Alpha 1: 0.1 g/dL (ref 0.0–0.4)
Alpha2 Glob SerPl Elph-Mcnc: 0.5 g/dL (ref 0.4–1.0)
B-Globulin SerPl Elph-Mcnc: 0.9 g/dL (ref 0.7–1.3)
Gamma Glob SerPl Elph-Mcnc: 0.8 g/dL (ref 0.4–1.8)
Globulin, Total: 2.3 g/dL (ref 2.2–3.9)
IgA: 207 mg/dL (ref 90–386)
IgG (Immunoglobin G), Serum: 940 mg/dL (ref 603–1613)
IgM (Immunoglobulin M), Srm: 34 mg/dL (ref 20–172)
Total Protein ELP: 5.9 g/dL — ABNORMAL LOW (ref 6.0–8.5)

## 2018-11-10 ENCOUNTER — Encounter: Payer: Self-pay | Admitting: Pain Medicine

## 2018-11-10 ENCOUNTER — Other Ambulatory Visit: Payer: Self-pay | Admitting: Pain Medicine

## 2018-11-10 DIAGNOSIS — G894 Chronic pain syndrome: Secondary | ICD-10-CM

## 2018-11-10 MED FILL — GABAPENTIN 600 MG TABLET: 600 | 30 days supply | Qty: 90 | Fill #1

## 2018-11-10 MED FILL — BACLOFEN 10 MG TABS: 10 | 30 days supply | Qty: 240 | Fill #2

## 2018-11-10 NOTE — Progress Notes (Signed)
Pain Management Virtual Encounter Note - Virtual Visit via Telephone Telehealth (real-time audio visits between healthcare provider and patient).   Patient's Phone No. & Preferred Pharmacy:  [phone] (home); [phone] (mobile); (Preferred) [phone] [email or web address]  [place], [address] [place] [address] Phone: [phone] Fax: [phone]    Pre-screening note:  Our staff contacted Mr. Phillippi and offered him an "in person", "face-to-face" appointment versus a telephone encounter. He indicated preferring the telephone encounter, at this time.   Reason for Virtual Visit: COVID-19*  Social distancing based on CDC and AMA recommendations.   I contacted [name] on 11/11/2018 via telephone.      I clearly identified myself as Gaspar Cola, MD. I verified that I was speaking with the correct person using two identifiers (Name: Marley Charlot., and date of birth: 1970-09-24).  Advanced Informed Consent I sought verbal advanced consent from [name] for virtual visit interactions. I informed Mr. Codrington of possible security and privacy concerns, risks, and limitations associated with providing "not-in-person" medical evaluation and management services. I also informed Mr. Phoenix of the availability of "in-person" appointments. Finally, I informed him that there would be a charge for the virtual visit and that he could be  personally, fully or partially, financially responsible for it. Mr. Decatur expressed understanding and agreed to proceed.   Historic Elements   Mr. Dyke Weible. is a 48 y.o. year old, male patient evaluated today after his last encounter by our practice on 10/14/2018. Mr. Crunkleton  has a past medical history of CKD (chronic kidney disease) stage 3, GFR 30-59 ml/min ([place]), Hypertension, Multiple myeloma ([place]) (08/11/2017), Neuropathy, and Pneumonia. He also  has a past  surgical history that includes Joint replacement; Knee surgery (Left); and Abdominal surgery. Mr. Butt has a current medication list which includes the following prescription(s): aspirin ec, lenalidomide, oxycodone, oxycodone, oxycodone, baclofen, gabapentin, and pregabalin. He  reports that he quit smoking about 5 years ago. His smokeless tobacco use includes snuff. He reports that he does not drink alcohol or use drugs. Mr. Spiewak has No Known Allergies.   HPI  Today, he is being contacted for both, medication management and a post-procedure assessment.  He indicates that while the local anesthetic was in place, he had complete relief of the pain.  He is looking at traveling to [place], but when he comes back he would like to have this repeated.  Before we do the injection, we will need to check to see if any chance he still has the component of sacroiliac joint pain, in which case we may do a bilateral lumbar facet block plus sacroiliac joint block to see if this provides him with a longer lasting benefit.  For now, he refers doing well with the medications and not having any side effects or adverse reactions.  Post-Procedure Evaluation  Procedure: Diagnostic bilateral lumbar facet block #2 under fluoroscopic guidance, no sedation Pre-procedure pain level:  6/10 Post-procedure: 4/10 No initial benefit, possibly due to rapid discharge after no sedation procedure, without enough time to allow full onset of block.  Sedation: None.  Effectiveness during initial hour after procedure(Ultra-Short Term Relief): 100 %   Local anesthetic used: Long-acting (4-6 hours) Effectiveness: Defined as any analgesic benefit obtained secondary to the administration of local anesthetics. This carries significant diagnostic value as to the etiological location, or anatomical origin, of the pain. Duration of benefit is  expected to coincide with the duration of the local anesthetic used.  Effectiveness during initial 4-6  hours after procedure(Short-Term Relief): 100 %(pt stated pain was back as soon as the numbness wore off)   Long-term benefit: Defined as any relief past the pharmacologic duration of the local anesthetics.  Effectiveness past the initial 6 hours after procedure(Long-Term Relief): 0 %   Current benefits: Defined as benefit that persist at this time.   Analgesia:  Back to baseline Function: Back to baseline ROM: Back to baseline  Pharmacotherapy Assessment  Analgesic: Oxycodone IR 5 mg, 1 tab PO BID (10 mg/day of oxycodone) MME/day: 15 mg/day.   Monitoring: Pharmacotherapy: No side-effects or adverse reactions reported. [place] PMP: PDMP not reviewed this encounter.       Compliance: No problems identified. Effectiveness: Clinically acceptable. Plan: Refer to "POC".  Pertinent Labs   SAFETY SCREENING Profile Lab Results  Component Value Date   SARSCOV2NAA NOT DETECTED 10/07/2018   COVIDSOURCE NASOPHARYNGEAL 10/07/2018   MRSAPCR NEGATIVE 08/12/2017   HIV Non Reactive 08/12/2017   Renal Function Lab Results  Component Value Date   BUN 25 (H) 10/22/2018   CREATININE 1.59 (H) 10/22/2018   BCR 7 (L) 11/26/2017   GFRAA 59 (L) 10/22/2018   GFRNONAA 51 (L) 10/22/2018   Hepatic Function Lab Results  Component Value Date   AST 15 10/22/2018   ALT 15 10/22/2018   ALBUMIN 3.9 10/22/2018   UDS Summary  Date Value Ref Range Status  04/02/2018 FINAL  Final    Comment:    ==================================================================== TOXASSURE SELECT 13 (MW) ==================================================================== Test                             Result       Flag       Units Drug Present and Declared for Prescription Verification   Oxycodone                      138          EXPECTED   ng/mg creat   Oxymorphone                    52           EXPECTED   ng/mg creat   Noroxycodone                   135          EXPECTED   ng/mg creat    Sources of oxycodone  include scheduled prescription medications.    Oxymorphone and noroxycodone are expected metabolites of    oxycodone. Oxymorphone is also available as a scheduled    prescription medication. ==================================================================== Test                      Result    Flag   Units      Ref Range   Creatinine              168              mg/dL      >=20 ==================================================================== Declared Medications:  The flagging and interpretation on this report are based on the  following declared medications.  Unexpected results may arise from  inaccuracies in the declared medications.  **Note: The testing scope of this panel includes these medications:  Oxycodone  **Note: The testing scope of this panel  does not include following  reported medications:  Acetaminophen  Acyclovir  Amoxicillin (Augmentin)  Aspirin (Aspirin 81)  Baclofen  Clavulinate (Augmentin)  Gabapentin  Lenalidomide  Meloxicam  Pantoprazole  Prednisone  Pregabalin  Promethazine  Vitamin D2 (Ergocalciferol) ==================================================================== For clinical consultation, please call [phone]. ====================================================================    Note: Above Lab results reviewed.  Recent imaging  DG Bone Survey Met CLINICAL DATA:  Multiple myeloma, currently in remission.  EXAM: METASTATIC BONE SURVEY  COMPARISON:  CT scan 12/22/2017  FINDINGS: The lateral skull film demonstrates numerous small lytic myelomatous lesions.  A few small scattered lytic myelomatous lesions are suspected in the clavicles and proximal humeri bilaterally.  Do not see any obvious lesions involving the spine or ribs. Remote healed rib fractures are noted.  No definite lesions involving the bony pelvis or lower extremities.  IMPRESSION: 1. Suspect small lytic myelomatous lesions involving the  skull, clavicles and bilateral proximal humeri. 2. No other definite lesions are identified.  Electronically Signed   By: Marijo Sanes M.D.   On: 10/22/2018 15:51  Assessment  The primary encounter diagnosis was Chronic pain syndrome. Diagnoses of Chronic low back pain (Primary Area of Pain) (Bilateral) (L>R) w/o sciatica, Lumbar facet syndrome (Bilateral) (L>R), Chronic sacroiliac joint pain (Bilateral) (R>L), Chemotherapy-induced peripheral neuropathy (HCC), Spasm of back muscles, Chronic musculoskeletal pain, Neuropathic pain, and Neurogenic pain were also pertinent to this visit.  Plan of Care  I have discontinued [place]s acyclovir, albuterol, oxyCODONE, and oxyCODONE. I have also changed his oxyCODONE. Additionally, I am having him start on oxyCODONE and oxyCODONE. Lastly, I am having him maintain his aspirin EC, lenalidomide, baclofen, gabapentin, and pregabalin.  Pharmacotherapy (Medications Ordered): Meds ordered this encounter  Medications  . baclofen (LIORESAL) 10 MG tablet    Sig: Take 1-2 tablets (10-20 mg total) by mouth 4 (four) times daily.    Dispense:  240 tablet    Refill:  2    Fill one day early if pharmacy is closed on scheduled refill date. May substitute for generic if available.  . gabapentin (NEURONTIN) 600 MG tablet    Sig: Take 1 tablet (600 mg total) by mouth 3 (three) times daily.    Dispense:  90 tablet    Refill:  2    Fill one day early if pharmacy is closed on scheduled refill date. May substitute for generic if available.  . pregabalin (LYRICA) 150 MG capsule    Sig: Take 1 capsule (150 mg total) by mouth 3 (three) times daily.    Dispense:  90 capsule    Refill:  2    Fill one day early if pharmacy is closed on scheduled refill date. May substitute for generic if available.  Marland Kitchen oxyCODONE (OXY IR/ROXICODONE) 5 MG immediate release tablet    Sig: Take 1 tablet (5 mg total) by mouth every 8 (eight) hours as needed for severe pain. Must last  30 days    Dispense:  90 tablet    Refill:  0    Chronic Pain: STOP Act (Not applicable) Fill 1 day early if closed on refill date. Do not fill until: 11/15/2018. To last until: 12/15/2018. Avoid benzodiazepines within 8 hours of opioids  . oxyCODONE (OXY IR/ROXICODONE) 5 MG immediate release tablet    Sig: Take 1 tablet (5 mg total) by mouth every 8 (eight) hours as needed for severe pain. Must last 30 days    Dispense:  90 tablet    Refill:  0  Chronic Pain: STOP Act (Not applicable) Fill 1 day early if closed on refill date. Do not fill until: 12/15/2018. To last until: 01/14/2019. Avoid benzodiazepines within 8 hours of opioids  . oxyCODONE (OXY IR/ROXICODONE) 5 MG immediate release tablet    Sig: Take 1 tablet (5 mg total) by mouth every 8 (eight) hours as needed for severe pain. Must last 30 days    Dispense:  90 tablet    Refill:  0    Chronic Pain: STOP Act (Not applicable) Fill 1 day early if closed on refill date. Do not fill until: 01/14/2019. To last until: 02/13/2019. Avoid benzodiazepines within 8 hours of opioids   Orders:  Orders Placed This Encounter  Procedures  . LUMBAR FACET(MEDIAL BRANCH NERVE BLOCK) MBNB    Standing Status:   Standing    Number of Occurrences:   5    Standing Expiration Date:   11/11/2019    Scheduling Instructions:     Purpose: Diagnostic     Indication: Axial low back pain. Lumbosacral Spondylosis (M47.897).      Side: Bilateral     Level: L3-4, L4-5, & L5-S1 Facets (L2, L3, L4, L5, & S1 Medial Branch Nerves)     Sedation: With Sedation.     TIMEFRAME: PRN procedure. (Mr. Oregel will call when needed.)    Order Specific Question:   Where will this procedure be performed?    Answer:   [facility]  . SACROILIAC JOINT INJECTION    For low back pain and groin pain.    Standing Status:   Standing    Number of Occurrences:   1    Standing Expiration Date:   11/11/2019    Scheduling Instructions:     Side: Bilateral     Sedation: Patient's  choice.     TIMEFRAME: PRN procedure. (Mr. Klahn will call when needed.)    Order Specific Question:   Where will this procedure be performed?    Answer:   [facility]   Follow-up plan:   Return in about 13 weeks (around 02/10/2019) for (VV), E/M (MM), in addition, PRN Procedure: (B) L-FCT + (B) S-I Blk #3.      Interventional management options:  Considering:   DiagnosticbilateralLESI Possible bilateral lumbar facet RFA Diagnostic bilateral sacroiliac joint block Possible bilateral sacroiliac joint RFA Diagnostic right knee Hyalgan series Diagnosticright knee intra-articular knee injection Diagnostic left knee genicular nerve block   Palliative PRN treatment(s):   Palliative bilateral intra-articular hip joint injection #2 (50/50/50/50) Palliative bilateral lumbar facet block #3 (100/100/0) (60/60/60) Palliative left piriformis muscle injection #2 (100/100/0)    Recent Visits Date Type Provider Dept  10/13/18 Procedure visit Milinda Pointer, MD [facility]  08/17/18 Office Visit Milinda Pointer, MD [facility]  Showing recent visits within past 90 days and meeting all other requirements   Today's Visits Date Type Provider Dept  11/11/18 Office Visit Milinda Pointer, MD [facility]  Showing today's visits and meeting all other requirements   Future Appointments No visits were found meeting these conditions.  Showing future appointments within next 90 days and meeting all other requirements   I discussed the assessment and treatment plan with the patient. The patient was provided an opportunity to ask questions and all were answered. The patient agreed with the plan and demonstrated an understanding of the instructions.  Patient advised to call back or seek an in-person evaluation if the symptoms or condition worsens.  Total duration of non-face-to-face  encounter: 15 minutes.  Note by: Gaspar Cola, MD Date:  11/11/2018; Time: 9:54 AM  Note: This dictation was prepared with Dragon dictation. Any transcriptional errors that may result from this process are unintentional.  Disclaimer:  * Given the special circumstances of the COVID-19 pandemic, the federal government has announced that the Office for Civil Rights (OCR) will exercise its enforcement discretion and will not impose penalties on physicians using telehealth in the event of noncompliance with regulatory requirements under the [place] (HIPAA) in connection with the good faith provision of telehealth during the CRFVO-36 national public health emergency. ([place])

## 2018-11-11 ENCOUNTER — Other Ambulatory Visit: Payer: Self-pay

## 2018-11-11 ENCOUNTER — Ambulatory Visit: Payer: No Typology Code available for payment source | Attending: Pain Medicine | Admitting: Pain Medicine

## 2018-11-11 ENCOUNTER — Telehealth: Payer: Self-pay | Admitting: Pain Medicine

## 2018-11-11 DIAGNOSIS — M47816 Spondylosis without myelopathy or radiculopathy, lumbar region: Secondary | ICD-10-CM | POA: Diagnosis not present

## 2018-11-11 DIAGNOSIS — M792 Neuralgia and neuritis, unspecified: Secondary | ICD-10-CM

## 2018-11-11 DIAGNOSIS — M62830 Muscle spasm of back: Secondary | ICD-10-CM

## 2018-11-11 DIAGNOSIS — M545 Low back pain: Secondary | ICD-10-CM | POA: Diagnosis not present

## 2018-11-11 DIAGNOSIS — M533 Sacrococcygeal disorders, not elsewhere classified: Secondary | ICD-10-CM

## 2018-11-11 DIAGNOSIS — G620 Drug-induced polyneuropathy: Secondary | ICD-10-CM

## 2018-11-11 DIAGNOSIS — T451X5A Adverse effect of antineoplastic and immunosuppressive drugs, initial encounter: Secondary | ICD-10-CM

## 2018-11-11 DIAGNOSIS — M7918 Myalgia, other site: Secondary | ICD-10-CM

## 2018-11-11 DIAGNOSIS — G894 Chronic pain syndrome: Secondary | ICD-10-CM

## 2018-11-11 DIAGNOSIS — G8929 Other chronic pain: Secondary | ICD-10-CM

## 2018-11-11 MED ORDER — OXYCODONE HCL 5 MG PO TABS: 5.0000 mg | ORAL_TABLET | Freq: Three times a day (TID) | ORAL | 0 refills | Status: DC | PRN

## 2018-11-11 MED ORDER — PREGABALIN 150 MG PO CAPS: 150.0000 mg | ORAL_CAPSULE | Freq: Three times a day (TID) | ORAL | 2 refills | Status: DC

## 2018-11-11 MED ORDER — BACLOFEN 10 MG PO TABS: 10.0000 mg | ORAL_TABLET | Freq: Four times a day (QID) | ORAL | 2 refills | Status: DC

## 2018-11-11 MED ORDER — GABAPENTIN 600 MG PO TABS: 600.0000 mg | ORAL_TABLET | Freq: Three times a day (TID) | ORAL | 2 refills | Status: DC

## 2018-11-11 MED FILL — PREGABALIN 150 MG CAPS: 150 | 30 days supply | Qty: 90 | Fill #0

## 2018-11-11 NOTE — Telephone Encounter (Signed)
Called to pharmacy to approve early fill for 11/13/18 in place of 11/15/18.  I did tell pharmacy that the next fill could not be early and will have to be filled on exact fill dates.  I then called patient to let him know that we have approved that early fill and that his medications will have to last until the next fill date of 12/15/18.  Patient verbalizes u/o information.

## 2018-11-11 NOTE — Telephone Encounter (Signed)
Pt states that he is going out of town and will need his meds filled on he 24th instead of the 26th. He said he told Dr Delane Ginger this at his visit but the pharmacy still needs permission.

## 2018-11-11 NOTE — Patient Instructions (Signed)

## 2018-11-13 ENCOUNTER — Other Ambulatory Visit: Payer: Self-pay | Admitting: Oncology

## 2018-11-13 ENCOUNTER — Telehealth: Payer: Self-pay | Admitting: *Deleted

## 2018-11-13 DIAGNOSIS — C9001 Multiple myeloma in remission: Secondary | ICD-10-CM

## 2018-11-13 MED FILL — oxyCODONE HCL 5 MG TABS: 5 | 30 days supply | Qty: 90 | Fill #0

## 2018-11-13 NOTE — Telephone Encounter (Signed)
rcvd paper fax about pt next shipment of revlimid. I see in ccomputer that a refill of revlimid was sent today but no REMS # I called and spoke to [facility] [place] [employer] and gave her REMS # [id number].  She will call pt. And get drug ready to be shipped to pt

## 2018-11-16 ENCOUNTER — Ambulatory Visit: Payer: No Typology Code available for payment source | Admitting: Pain Medicine

## 2018-11-22 ENCOUNTER — Other Ambulatory Visit: Payer: Self-pay | Admitting: Primary Care

## 2018-11-22 DIAGNOSIS — I10 Essential (primary) hypertension: Secondary | ICD-10-CM

## 2018-11-23 ENCOUNTER — Inpatient Hospital Stay: Payer: No Typology Code available for payment source

## 2018-11-23 ENCOUNTER — Other Ambulatory Visit: Payer: Self-pay

## 2018-11-23 ENCOUNTER — Inpatient Hospital Stay: Payer: No Typology Code available for payment source | Attending: Oncology

## 2018-11-23 DIAGNOSIS — Z7983 Long term (current) use of bisphosphonates: Secondary | ICD-10-CM

## 2018-11-23 DIAGNOSIS — C9001 Multiple myeloma in remission: Secondary | ICD-10-CM | POA: Insufficient documentation

## 2018-11-23 DIAGNOSIS — R7989 Other specified abnormal findings of blood chemistry: Secondary | ICD-10-CM

## 2018-11-23 DIAGNOSIS — Z9481 Bone marrow transplant status: Secondary | ICD-10-CM

## 2018-11-23 DIAGNOSIS — Z299 Encounter for prophylactic measures, unspecified: Secondary | ICD-10-CM

## 2018-11-23 LAB — COMPREHENSIVE METABOLIC PANEL
ALT: 18 U/L (ref 0–44)
AST: 19 U/L (ref 15–41)
Albumin: 3.8 g/dL (ref 3.5–5.0)
Alkaline Phosphatase: 44 U/L (ref 38–126)
Anion gap: 7 (ref 5–15)
BUN: 18 mg/dL (ref 6–20)
CO2: 29 mmol/L (ref 22–32)
Calcium: 9.1 mg/dL (ref 8.9–10.3)
Chloride: 104 mmol/L (ref 98–111)
Creatinine, Ser: 1.79 mg/dL — ABNORMAL HIGH (ref 0.61–1.24)
GFR calc Af Amer: 51 mL/min — ABNORMAL LOW (ref >60)
GFR calc non Af Amer: 44 mL/min — ABNORMAL LOW (ref >60)
Glucose, Bld: 81 mg/dL (ref 70–99)
Potassium: 3.9 mmol/L (ref 3.5–5.1)
Sodium: 140 mmol/L (ref 135–145)
Total Bilirubin: 0.8 mg/dL (ref 0.3–1.2)
Total Protein: 6.4 g/dL — ABNORMAL LOW (ref 6.5–8.1)

## 2018-11-23 LAB — CBC WITH DIFFERENTIAL/PLATELET
Abs Immature Granulocytes: 0.01 10*3/uL (ref 0.00–0.07)
Basophils Absolute: 0 10*3/uL (ref 0.0–0.1)
Basophils Relative: 1 %
Eosinophils Absolute: 0.1 10*3/uL (ref 0.0–0.5)
Eosinophils Relative: 3 %
HCT: 38.9 % — ABNORMAL LOW (ref 39.0–52.0)
Hemoglobin: 13.7 g/dL (ref 13.0–17.0)
Immature Granulocytes: 0 %
Lymphocytes Relative: 23 %
Lymphs Abs: 0.7 10*3/uL (ref 0.7–4.0)
MCH: 34.2 pg — ABNORMAL HIGH (ref 26.0–34.0)
MCHC: 35.2 g/dL (ref 30.0–36.0)
MCV: 97 fL (ref 80.0–100.0)
Monocytes Absolute: 0.4 10*3/uL (ref 0.1–1.0)
Monocytes Relative: 13 %
Neutro Abs: 1.7 10*3/uL (ref 1.7–7.7)
Neutrophils Relative %: 60 %
Platelets: 131 10*3/uL — ABNORMAL LOW (ref 150–400)
RBC: 4.01 MIL/uL — ABNORMAL LOW (ref 4.22–5.81)
RDW: 13.4 % (ref 11.5–15.5)
WBC: 2.9 10*3/uL — ABNORMAL LOW (ref 4.0–10.5)
nRBC: 0 % (ref 0.0–0.2)

## 2018-11-23 MED ORDER — DENOSUMAB 120 MG/1.7ML ~~LOC~~ SOLN
120.0000 mg | Freq: Once | SUBCUTANEOUS | Status: AC
  Administered 2018-11-23: 120 mg via SUBCUTANEOUS

## 2018-11-24 ENCOUNTER — Encounter: Payer: Self-pay | Admitting: *Deleted

## 2018-11-24 ENCOUNTER — Encounter: Payer: Self-pay | Admitting: Oncology

## 2018-12-07 ENCOUNTER — Encounter: Payer: Self-pay | Admitting: Oncology

## 2018-12-07 ENCOUNTER — Encounter: Payer: Self-pay | Admitting: Urology

## 2018-12-07 ENCOUNTER — Ambulatory Visit: Payer: No Typology Code available for payment source | Admitting: Urology

## 2018-12-08 ENCOUNTER — Other Ambulatory Visit: Payer: Self-pay

## 2018-12-08 ENCOUNTER — Other Ambulatory Visit (INDEPENDENT_AMBULATORY_CARE_PROVIDER_SITE_OTHER): Payer: No Typology Code available for payment source

## 2018-12-08 DIAGNOSIS — I10 Essential (primary) hypertension: Secondary | ICD-10-CM

## 2018-12-08 LAB — LIPID PANEL
Cholesterol: 152 mg/dL (ref 0–200)
HDL: 45.2 mg/dL (ref >39.00)
LDL Cholesterol: 88 mg/dL (ref 0–99)
NonHDL: 107.14
Total CHOL/HDL Ratio: 3
Triglycerides: 95 mg/dL (ref 0.0–149.0)
VLDL: 19 mg/dL (ref 0.0–40.0)

## 2018-12-10 ENCOUNTER — Other Ambulatory Visit: Payer: Self-pay | Admitting: *Deleted

## 2018-12-10 ENCOUNTER — Encounter: Payer: Self-pay | Admitting: *Deleted

## 2018-12-10 MED ORDER — SERTRALINE HCL 25 MG PO TABS: 25.0000 mg | ORAL_TABLET | Freq: Every day | ORAL | 1 refills | Status: DC

## 2018-12-11 MED FILL — GABAPENTIN 600 MG TABLET: 600 | 30 days supply | Qty: 90 | Fill #0

## 2018-12-14 ENCOUNTER — Ambulatory Visit (INDEPENDENT_AMBULATORY_CARE_PROVIDER_SITE_OTHER): Payer: No Typology Code available for payment source | Admitting: Primary Care

## 2018-12-14 ENCOUNTER — Encounter: Payer: Self-pay | Admitting: Primary Care

## 2018-12-14 ENCOUNTER — Other Ambulatory Visit: Payer: Self-pay

## 2018-12-14 VITALS — BP 122/80 | HR 95 | Temp 98.1°F | Ht 67.0 in | Wt 141.8 lb

## 2018-12-14 DIAGNOSIS — G620 Drug-induced polyneuropathy: Secondary | ICD-10-CM | POA: Diagnosis not present

## 2018-12-14 DIAGNOSIS — T451X5A Adverse effect of antineoplastic and immunosuppressive drugs, initial encounter: Secondary | ICD-10-CM

## 2018-12-14 DIAGNOSIS — I10 Essential (primary) hypertension: Secondary | ICD-10-CM

## 2018-12-14 DIAGNOSIS — C9001 Multiple myeloma in remission: Secondary | ICD-10-CM

## 2018-12-14 DIAGNOSIS — M5441 Lumbago with sciatica, right side: Secondary | ICD-10-CM

## 2018-12-14 DIAGNOSIS — F064 Anxiety disorder due to known physiological condition: Secondary | ICD-10-CM

## 2018-12-14 DIAGNOSIS — Z0001 Encounter for general adult medical examination with abnormal findings: Secondary | ICD-10-CM | POA: Insufficient documentation

## 2018-12-14 DIAGNOSIS — G8929 Other chronic pain: Secondary | ICD-10-CM

## 2018-12-14 DIAGNOSIS — M5442 Lumbago with sciatica, left side: Secondary | ICD-10-CM

## 2018-12-14 DIAGNOSIS — Z0000 Encounter for general adult medical examination without abnormal findings: Secondary | ICD-10-CM | POA: Insufficient documentation

## 2018-12-14 DIAGNOSIS — N183 Chronic kidney disease, stage 3 unspecified: Secondary | ICD-10-CM

## 2018-12-14 DIAGNOSIS — G894 Chronic pain syndrome: Secondary | ICD-10-CM

## 2018-12-14 NOTE — Assessment & Plan Note (Signed)
Chronic to hands and feet since chemotherapy. Doing well on gabapentin daily, continue same.

## 2018-12-14 NOTE — Assessment & Plan Note (Signed)
Chronic, stable on recent labs. Followed by cancer center.

## 2018-12-14 NOTE — Assessment & Plan Note (Signed)
Following with pain management for chronic lower back pain. Doing well on current regimen. Continue same.

## 2018-12-14 NOTE — Patient Instructions (Addendum)
It's important to improve your diet by reducing consumption of fast food, fried food, processed snack foods, sugary drinks. Increase consumption of fresh vegetables and fruits, whole grains, water.  Ensure you are drinking 64 ounces of water daily.  It was a pleasure to see you today!   Preventive Care 40-48 Years Old, Male Preventive care refers to lifestyle choices and visits with your health care provider that can promote health and wellness. This includes:  A yearly physical exam. This is also called an annual well check.  Regular dental and eye exams.  Immunizations.  Screening for certain conditions.  Healthy lifestyle choices, such as eating a healthy diet, getting regular exercise, not using drugs or products that contain nicotine and tobacco, and limiting alcohol use. What can I expect for my preventive care visit? Physical exam Your health care provider will check:  Height and weight. These may be used to calculate body mass index (BMI), which is a measurement that tells if you are at a healthy weight.  Heart rate and blood pressure.  Your skin for abnormal spots. Counseling Your health care provider may ask you questions about:  Alcohol, tobacco, and drug use.  Emotional well-being.  Home and relationship well-being.  Sexual activity.  Eating habits.  Work and work environment. What immunizations do I need?  Influenza (flu) vaccine  This is recommended every year. Tetanus, diphtheria, and pertussis (Tdap) vaccine  You may need a Td booster every 10 years. Varicella (chickenpox) vaccine  You may need this vaccine if you have not already been vaccinated. Zoster (shingles) vaccine  You may need this after age 60. Measles, mumps, and rubella (MMR) vaccine  You may need at least one dose of MMR if you were born in 1957 or later. You may also need a second dose. Pneumococcal conjugate (PCV13) vaccine  You may need this if you have certain conditions  and were not previously vaccinated. Pneumococcal polysaccharide (PPSV23) vaccine  You may need one or two doses if you smoke cigarettes or if you have certain conditions. Meningococcal conjugate (MenACWY) vaccine  You may need this if you have certain conditions. Hepatitis A vaccine  You may need this if you have certain conditions or if you travel or work in places where you may be exposed to hepatitis A. Hepatitis B vaccine  You may need this if you have certain conditions or if you travel or work in places where you may be exposed to hepatitis B. Haemophilus influenzae type b (Hib) vaccine  You may need this if you have certain risk factors. Human papillomavirus (HPV) vaccine  If recommended by your health care provider, you may need three doses over 6 months. You may receive vaccines as individual doses or as more than one vaccine together in one shot (combination vaccines). Talk with your health care provider about the risks and benefits of combination vaccines. What tests do I need? Blood tests  Lipid and cholesterol levels. These may be checked every 5 years, or more frequently if you are over 50 years old.  Hepatitis C test.  Hepatitis B test. Screening  Lung cancer screening. You may have this screening every year starting at age 55 if you have a 30-pack-year history of smoking and currently smoke or have quit within the past 15 years.  Prostate cancer screening. Recommendations will vary depending on your family history and other risks.  Colorectal cancer screening. All adults should have this screening starting at age 50 and continuing until age 75.   Your health care provider may recommend screening at age 47 if you are at increased risk. You will have tests every 1-10 years, depending on your results and the type of screening test.  Diabetes screening. This is done by checking your blood sugar (glucose) after you have not eaten for a while (fasting). You may have this  done every 1-3 years.  Sexually transmitted disease (STD) testing. Follow these instructions at home: Eating and drinking  Eat a diet that includes fresh fruits and vegetables, whole grains, lean protein, and low-fat dairy products.  Take vitamin and mineral supplements as recommended by your health care provider.  Do not drink alcohol if your health care provider tells you not to drink.  If you drink alcohol: ? Limit how much you have to 0-2 drinks a day. ? Be aware of how much alcohol is in your drink. In the [place] one drink equals one 12 oz bottle of beer (355 mL), one 5 oz glass of wine (148 mL), or one 1 oz glass of hard liquor (44 mL). Lifestyle  Take daily care of your teeth and gums.  Stay active. Exercise for at least 30 minutes on 5 or more days each week.  Do not use any products that contain nicotine or tobacco, such as cigarettes, e-cigarettes, and chewing tobacco. If you need help quitting, ask your health care provider.  If you are sexually active, practice safe sex. Use a condom or other form of protection to prevent STIs (sexually transmitted infections).  Talk with your health care provider about taking a low-dose aspirin every day starting at age 50. What's next?  Go to your health care provider once a year for a well check visit.  Ask your health care provider how often you should have your eyes and teeth checked.  Stay up to date on all vaccines. This information is not intended to replace advice given to you by your health care provider. Make sure you discuss any questions you have with your health care provider. Document Released: 05/05/2015 Document Revised: 04/02/2018 Document Reviewed: 04/02/2018 Elsevier Patient Education  2020 [employer].

## 2018-12-14 NOTE — Assessment & Plan Note (Addendum)
Following with oncology monthly, currently in remission. Continue Revlimid as prescribed.

## 2018-12-14 NOTE — Assessment & Plan Note (Signed)
Immunizations UTD, declines influenza vaccination despite recommendations. Encouraged a healthy diet, regular exercise. Exam today unremarkable. Labs reviewed. Follow up in 1 year for CPE.

## 2018-12-14 NOTE — Assessment & Plan Note (Signed)
Stable in the office today, continue to monitor.  Not currently on meds.

## 2018-12-14 NOTE — Progress Notes (Signed)
Subjective:    Patient ID: James House., male    DOB: [birth year], 48 y.o.   MRN: [id number]  HPI  Mr. Alton Revere. Is a 48 year old male who presents today for complete physical.  Immunizations: -Tetanus: Completed in 2020 -Influenza: Due this season, declines  -Pneumonia: Completed Prevnar in 2020  Diet: He endorses a healthy diet. Home cooked meals mostly, desserts daily. Drinking sweet tea and water.  Exercise: He is not exercising  Eye exam: No exam in 2020 Dental exam: Completed in 2019  BP Readings from Last 3 Encounters:  12/14/18 122/80  10/22/18 133/88  10/13/18 (!) 155/94   Increased anxiety since chemotherapy, stem cell transplant. He is also having a lot of stress with his personal life. Since going back to work three weeks ago he's struggled with anxiety. He is on long term disability and will be seen by a psychiatrist in early September for evaluation.   Review of Systems  Constitutional: Negative for unexpected weight change.  HENT: Negative for rhinorrhea.   Respiratory: Negative for cough and shortness of breath.   Cardiovascular: Negative for chest pain.  Gastrointestinal: Negative for constipation and diarrhea.  Genitourinary: Negative for difficulty urinating.  Musculoskeletal: Positive for arthralgias and back pain. Negative for myalgias.  Skin: Negative for rash.  Allergic/Immunologic: Negative for environmental allergies.  Neurological: Negative for dizziness, numbness and headaches.  Psychiatric/Behavioral: The patient is nervous/anxious.        Past Medical History:  Diagnosis Date  . CKD (chronic kidney disease) stage 3, GFR 30-59 ml/min (HCC)   . Hypertension   . Multiple myeloma ([place]) 08/11/2017  . Neuropathy   . Pneumonia      Social History   Socioeconomic History  . Marital status: Married    Spouse name: [name]  . Number of children: 3  . Years of education: Not on file  . Highest education level: Not on file  Occupational  History  . Not on file  Social Needs  . Financial resource strain: Not hard at all  . Food insecurity    Worry: Never true    Inability: Never true  . Transportation needs    Medical: No    Non-medical: No  Tobacco Use  . Smoking status: Former Smoker    Quit date: 08/19/2013    Years since quitting: 5.3  . Smokeless tobacco: Current User    Types: Snuff  Substance and Sexual Activity  . Alcohol use: No  . Drug use: No  . Sexual activity: Yes  Lifestyle  . Physical activity    Days per week: 7 days    Minutes per session: 30 min  . Stress: Only a little  Relationships  . Social Herbalist on phone: Once a week    Gets together: Once a week    Attends religious service: More than 4 times per year    Active member of club or organization: No    Attends meetings of clubs or organizations: Never    Relationship status: Married  . Intimate partner violence    Fear of current or ex partner: No    Emotionally abused: No    Physically abused: No    Forced sexual activity: No  Other Topics Concern  . Not on file  Social History Narrative   Live in private residence with spouse and mother in law    Past Surgical History:  Procedure Laterality Date  . ABDOMINAL SURGERY    .  JOINT REPLACEMENT     right knee  . KNEE SURGERY Left     Family History  Problem Relation Age of Onset  . Cancer Mother 78       Pancreatic  . COPD Mother   . Diabetes Mother   . Hyperlipidemia Mother   . Hypertension Mother   . Cancer Maternal Uncle        pancreatic  . Cancer Maternal Grandmother 30       colon  . COPD Maternal Grandmother   . Diabetes Maternal Grandmother   . Hypertension Maternal Grandmother     No Known Allergies  Current Outpatient Medications on File Prior to Visit  Medication Sig Dispense Refill  . sertraline (ZOLOFT) 25 MG tablet Take 25 mg by mouth daily.    Marland Kitchen aspirin EC 81 MG tablet Take 81 mg by mouth daily.    . baclofen (LIORESAL) 10 MG tablet  Take 1-2 tablets (10-20 mg total) by mouth 4 (four) times daily. 240 tablet 2  . gabapentin (NEURONTIN) 600 MG tablet Take 1 tablet (600 mg total) by mouth 3 (three) times daily. 90 tablet 2  . lenalidomide (REVLIMID) 5 MG capsule TAKE 1 CAPSULE BY MOUTH DAILY 28 capsule 0  . oxyCODONE (OXY IR/ROXICODONE) 5 MG immediate release tablet Take 1 tablet (5 mg total) by mouth every 8 (eight) hours as needed for severe pain. Must last 30 days 90 tablet 0  . [START ON 12/15/2018] oxyCODONE (OXY IR/ROXICODONE) 5 MG immediate release tablet Take 1 tablet (5 mg total) by mouth every 8 (eight) hours as needed for severe pain. Must last 30 days 90 tablet 0  . [START ON 01/14/2019] oxyCODONE (OXY IR/ROXICODONE) 5 MG immediate release tablet Take 1 tablet (5 mg total) by mouth every 8 (eight) hours as needed for severe pain. Must last 30 days 90 tablet 0   No current facility-administered medications on file prior to visit.     BP 122/80   Pulse 95   Temp 98.1 F (36.7 C) (Temporal)   Ht '5\' 7"'  (1.702 m)   Wt 141 lb 12 oz (64.3 kg)   SpO2 99%   BMI 22.20 kg/m    Objective:   Physical Exam  Constitutional: He is oriented to person, place, and time. He appears well-nourished.  HENT:  Right Ear: Tympanic membrane and ear canal normal.  Left Ear: Tympanic membrane and ear canal normal.  Mouth/Throat: Oropharynx is clear and moist.  Eyes: Pupils are equal, round, and reactive to light. EOM are normal.  Neck: Neck supple.  Cardiovascular: Normal rate and regular rhythm.  Respiratory: Effort normal and breath sounds normal.  GI: Soft. Bowel sounds are normal. There is no abdominal tenderness.  Musculoskeletal:     Comments: Decrease in ROM to lumbar spine, chronic  Neurological: He is alert and oriented to person, place, and time.  Skin: Skin is warm and dry.  Psychiatric: He has a normal mood and affect.           Assessment & Plan:

## 2018-12-14 NOTE — Assessment & Plan Note (Signed)
Following with pain management, doing well on prescribed regimen of oxycodone. Continue same.

## 2018-12-15 ENCOUNTER — Other Ambulatory Visit: Payer: Self-pay | Admitting: Oncology

## 2018-12-15 DIAGNOSIS — C9001 Multiple myeloma in remission: Secondary | ICD-10-CM

## 2018-12-15 MED FILL — SERTRALINE HCL 25 MG TABLET: 25 | 30 days supply | Qty: 30 | Fill #0

## 2018-12-15 MED FILL — oxyCODONE HCL 5 MG TABS: 5 | 30 days supply | Qty: 90 | Fill #0

## 2018-12-24 ENCOUNTER — Other Ambulatory Visit: Payer: Self-pay

## 2018-12-24 ENCOUNTER — Ambulatory Visit: Payer: No Typology Code available for payment source

## 2018-12-24 ENCOUNTER — Inpatient Hospital Stay (HOSPITAL_BASED_OUTPATIENT_CLINIC_OR_DEPARTMENT_OTHER): Payer: No Typology Code available for payment source | Admitting: Oncology

## 2018-12-24 ENCOUNTER — Inpatient Hospital Stay: Payer: No Typology Code available for payment source

## 2018-12-24 ENCOUNTER — Inpatient Hospital Stay: Payer: No Typology Code available for payment source | Attending: Oncology

## 2018-12-24 VITALS — BP 127/90 | HR 61 | Temp 98.2°F | Resp 16 | Wt 143.4 lb

## 2018-12-24 DIAGNOSIS — Z79899 Other long term (current) drug therapy: Secondary | ICD-10-CM | POA: Diagnosis not present

## 2018-12-24 DIAGNOSIS — Z7983 Long term (current) use of bisphosphonates: Secondary | ICD-10-CM

## 2018-12-24 DIAGNOSIS — G620 Drug-induced polyneuropathy: Secondary | ICD-10-CM | POA: Diagnosis not present

## 2018-12-24 DIAGNOSIS — T451X5A Adverse effect of antineoplastic and immunosuppressive drugs, initial encounter: Secondary | ICD-10-CM

## 2018-12-24 DIAGNOSIS — R7989 Other specified abnormal findings of blood chemistry: Secondary | ICD-10-CM

## 2018-12-24 DIAGNOSIS — C9001 Multiple myeloma in remission: Secondary | ICD-10-CM | POA: Diagnosis present

## 2018-12-24 DIAGNOSIS — Z299 Encounter for prophylactic measures, unspecified: Secondary | ICD-10-CM

## 2018-12-24 DIAGNOSIS — Z9481 Bone marrow transplant status: Secondary | ICD-10-CM

## 2018-12-24 LAB — CBC WITH DIFFERENTIAL/PLATELET
Abs Immature Granulocytes: 0.01 10*3/uL (ref 0.00–0.07)
Basophils Absolute: 0 10*3/uL (ref 0.0–0.1)
Basophils Relative: 1 %
Eosinophils Absolute: 0.1 10*3/uL (ref 0.0–0.5)
Eosinophils Relative: 3 %
HCT: 39.8 % (ref 39.0–52.0)
Hemoglobin: 13.7 g/dL (ref 13.0–17.0)
Immature Granulocytes: 0 %
Lymphocytes Relative: 17 %
Lymphs Abs: 0.6 10*3/uL — ABNORMAL LOW (ref 0.7–4.0)
MCH: 33.7 pg (ref 26.0–34.0)
MCHC: 34.4 g/dL (ref 30.0–36.0)
MCV: 97.8 fL (ref 80.0–100.0)
Monocytes Absolute: 0.4 10*3/uL (ref 0.1–1.0)
Monocytes Relative: 11 %
Neutro Abs: 2.3 10*3/uL (ref 1.7–7.7)
Neutrophils Relative %: 68 %
Platelets: 155 10*3/uL (ref 150–400)
RBC: 4.07 MIL/uL — ABNORMAL LOW (ref 4.22–5.81)
RDW: 13.6 % (ref 11.5–15.5)
WBC: 3.4 10*3/uL — ABNORMAL LOW (ref 4.0–10.5)
nRBC: 0 % (ref 0.0–0.2)

## 2018-12-24 LAB — COMPREHENSIVE METABOLIC PANEL
ALT: 17 U/L (ref 0–44)
AST: 22 U/L (ref 15–41)
Albumin: 4 g/dL (ref 3.5–5.0)
Alkaline Phosphatase: 47 U/L (ref 38–126)
Anion gap: 7 (ref 5–15)
BUN: 17 mg/dL (ref 6–20)
CO2: 25 mmol/L (ref 22–32)
Calcium: 8.6 mg/dL — ABNORMAL LOW (ref 8.9–10.3)
Chloride: 107 mmol/L (ref 98–111)
Creatinine, Ser: 1.44 mg/dL — ABNORMAL HIGH (ref 0.61–1.24)
GFR calc Af Amer: >60 mL/min (ref >60)
GFR calc non Af Amer: 57 mL/min — ABNORMAL LOW (ref >60)
Glucose, Bld: 96 mg/dL (ref 70–99)
Potassium: 3.8 mmol/L (ref 3.5–5.1)
Sodium: 139 mmol/L (ref 135–145)
Total Bilirubin: 0.7 mg/dL (ref 0.3–1.2)
Total Protein: 6.5 g/dL (ref 6.5–8.1)

## 2018-12-24 MED ORDER — DENOSUMAB 120 MG/1.7ML ~~LOC~~ SOLN
120.0000 mg | Freq: Once | SUBCUTANEOUS | Status: AC
  Administered 2018-12-24: 120 mg via SUBCUTANEOUS
  Filled 2018-12-24: qty 1.7

## 2018-12-24 MED FILL — SERTRALINE HCL 25 MG TABLET: 25 | 30 days supply | Qty: 30 | Fill #0

## 2018-12-25 LAB — KAPPA/LAMBDA LIGHT CHAINS
Kappa free light chain: 15 mg/L (ref 3.3–19.4)
Kappa, lambda light chain ratio: 1.28 (ref 0.26–1.65)
Lambda free light chains: 11.7 mg/L (ref 5.7–26.3)

## 2018-12-26 LAB — MULTIPLE MYELOMA PANEL, SERUM
Albumin SerPl Elph-Mcnc: 3.6 g/dL (ref 2.9–4.4)
Albumin/Glob SerPl: 1.7 (ref 0.7–1.7)
Alpha 1: 0.2 g/dL (ref 0.0–0.4)
Alpha2 Glob SerPl Elph-Mcnc: 0.6 g/dL (ref 0.4–1.0)
B-Globulin SerPl Elph-Mcnc: 0.8 g/dL (ref 0.7–1.3)
Gamma Glob SerPl Elph-Mcnc: 0.6 g/dL (ref 0.4–1.8)
Globulin, Total: 2.2 g/dL (ref 2.2–3.9)
IgA: 171 mg/dL (ref 90–386)
IgG (Immunoglobin G), Serum: 771 mg/dL (ref 603–1613)
IgM (Immunoglobulin M), Srm: 36 mg/dL (ref 20–172)
Total Protein ELP: 5.8 g/dL — ABNORMAL LOW (ref 6.0–8.5)

## 2018-12-28 ENCOUNTER — Encounter: Payer: Self-pay | Admitting: Oncology

## 2018-12-28 NOTE — Progress Notes (Addendum)
Hematology/Oncology Consult note [facility]  [phone] [phone]  Patient Care Team: Pleas Koch, NP as PCP - General (Internal Medicine) Vella Redhead, MD as PCP - Hematology/Oncology   Name of the patient: James House  [id number]  [birth year]   Date of visit: 12/28/18  Diagnosis- light chain kappa multiple myeloma s/p ASCT currently in remission. Patient on maintenance revlimid  Chief complaint/ Reason for visit- routine f/u of multiple myeloma on maintenance revlimid  Heme/Onc history: Patient is a 48 year old male with a history of kappa light chain myeloma that was treated by [name]. History is as follows:  1. Patient presented with renal insufficiency with a creatinine of 2.4 in April 2018. Kidney biopsy showed myeloma cast nephropathy. Kappa light chain was 3004 and lambda 0.22 with a kappa lambda ratio of 13,000 655. Skeletal survey showed multiple calvarial and marrow lesions. Bone marrow biopsy in May 2018 showed 43% plasma cells. He received CyBorDfor cycle 1 in May 2018 and was subsequently switched to RVD. He received 4 cycles followed by a repeat bone marrow biopsy in August 2018 which showed no increase in plasma cells.  2.He underwent autologous stem cell transplantation on 01/30/2017. He was started on Revlimid maintenance 10 mg daily in January 2019.  3.Labs from January February and March 2019 done at [facility] showed no M spike, normal kappa lambda ratio of 1.06. He was last seen by them in April 2019.  Following that patient was admitted to [facility] for healthcare associated pneumonia and was recently discharged. He wished to transfer his care to [place] at this time.  With regards to his myeloma patient is currently on 10 mg of Revlimid daily. He is also on acyclovir 400 mg twice daily which he is to continue for a minimum of 1 year post transplant. Patient has chronic back pain as well as  chemo-induced peripheral neuropathy for which he was seen by [name] in the past and is currently seeing [facility] pain clinic and is under pain contract with them  Interval history- his neuropathy has been continuing to bother him. 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 Wakes up in the morning feeling fatigued.   ECOG PS- 1 Pain scale- 4 Opioid associated constipation- no  Review of systems- Review of Systems  Constitutional: Positive for malaise/fatigue. Negative for chills, fever and weight loss.  HENT: Negative for congestion, ear discharge and nosebleeds.   Eyes: Negative for blurred vision.  Respiratory: Negative for cough, hemoptysis, sputum production, shortness of breath and wheezing.   Cardiovascular: Negative for chest pain, palpitations, orthopnea and claudication.  Gastrointestinal: Negative for abdominal pain, blood in stool, constipation, diarrhea, heartburn, melena, nausea and vomiting.  Genitourinary: Negative for dysuria, flank pain, frequency, hematuria and urgency.  Musculoskeletal: Negative for back pain, joint pain and myalgias.  Skin: Negative for rash.  Neurological: Positive for sensory change (peripheral neuropathy). Negative for dizziness, tingling, focal weakness, seizures, weakness and headaches.  Endo/Heme/Allergies: Does not bruise/bleed easily.  Psychiatric/Behavioral: Negative for depression and suicidal ideas. The patient does not have insomnia.       No Known Allergies   Past Medical History:  Diagnosis Date  . CKD (chronic kidney disease) stage 3, GFR 30-59 ml/min (HCC)   . Hypertension   . Multiple myeloma ([place]) 08/11/2017  . Neuropathy   . Pneumonia      Past  Surgical History:  Procedure Laterality Date  . ABDOMINAL SURGERY    . JOINT REPLACEMENT     right knee  . KNEE SURGERY Left     Social History   Socioeconomic History  . Marital  status: Married    Spouse name: [name]  . Number of children: 3  . Years of education: Not on file  . Highest education level: Not on file  Occupational History  . Not on file  Social Needs  . Financial resource strain: Not hard at all  . Food insecurity    Worry: Never true    Inability: Never true  . Transportation needs    Medical: No    Non-medical: No  Tobacco Use  . Smoking status: Former Smoker    Quit date: 08/19/2013    Years since quitting: 5.3  . Smokeless tobacco: Current User    Types: Snuff  Substance and Sexual Activity  . Alcohol use: No  . Drug use: No  . Sexual activity: Yes  Lifestyle  . Physical activity    Days per week: 7 days    Minutes per session: 30 min  . Stress: Only a little  Relationships  . Social Herbalist on phone: Once a week    Gets together: Once a week    Attends religious service: More than 4 times per year    Active member of club or organization: No    Attends meetings of clubs or organizations: Never    Relationship status: Married  . Intimate partner violence    Fear of current or ex partner: No    Emotionally abused: No    Physically abused: No    Forced sexual activity: No  Other Topics Concern  . Not on file  Social History Narrative   Live in private residence with spouse and mother in law    Family History  Problem Relation Age of Onset  . Cancer Mother 59       Pancreatic  . COPD Mother   . Diabetes Mother   . Hyperlipidemia Mother   . Hypertension Mother   . Cancer Maternal Uncle        pancreatic  . Cancer Maternal Grandmother 52       colon  . COPD Maternal Grandmother   . Diabetes Maternal Grandmother   . Hypertension Maternal Grandmother      Current Outpatient Medications:  .  aspirin EC 81 MG tablet, Take 81 mg by mouth daily., Disp: , Rfl:  .  baclofen (LIORESAL) 10 MG tablet, Take 1-2 tablets (10-20 mg total) by mouth 4 (four) times daily., Disp: 240 tablet, Rfl: 2 .  gabapentin  (NEURONTIN) 600 MG tablet, Take 1 tablet (600 mg total) by mouth 3 (three) times daily., Disp: 90 tablet, Rfl: 2 .  lenalidomide (REVLIMID) 5 MG capsule, TAKE 1 CAPSULE BY MOUTH DAILY, Disp: 28 capsule, Rfl: 0 .  oxyCODONE (OXY IR/ROXICODONE) 5 MG immediate release tablet, Take 1 tablet (5 mg total) by mouth every 8 (eight) hours as needed for severe pain. Must last 30 days, Disp: 90 tablet, Rfl: 0 .  oxyCODONE (OXY IR/ROXICODONE) 5 MG immediate release tablet, Take 1 tablet (5 mg total) by mouth every 8 (eight) hours as needed for severe pain. Must last 30 days, Disp: 90 tablet, Rfl: 0 .  [START ON 01/14/2019] oxyCODONE (OXY IR/ROXICODONE) 5 MG immediate release tablet, Take 1 tablet (5 mg total) by mouth every 8 (eight) hours as needed  for severe pain. Must last 30 days, Disp: 90 tablet, Rfl: 0 .  sertraline (ZOLOFT) 25 MG tablet, Take 25 mg by mouth daily., Disp: , Rfl:   Physical exam:  Vitals:   12/28/18 1641  BP: 127/90  Pulse: 61  Resp: 16  Temp: 98.2 F (36.8 C)  TempSrc: Tympanic  Weight: 143 lb 6.4 oz (65 kg)   Physical Exam HENT:     Head: Normocephalic and atraumatic.  Eyes:     Pupils: Pupils are equal, round, and reactive to light.  Neck:     Musculoskeletal: Normal range of motion.  Cardiovascular:     Rate and Rhythm: Normal rate and regular rhythm.     Heart sounds: Normal heart sounds.  Pulmonary:     Effort: Pulmonary effort is normal.     Breath sounds: Normal breath sounds.  Abdominal:     General: Bowel sounds are normal.     Palpations: Abdomen is soft.  Skin:    General: Skin is warm and dry.  Neurological:     Mental Status: He is alert and oriented to person, place, and time.      CMP Latest Ref Rng & Units 12/24/2018  Glucose 70 - 99 mg/dL 96  BUN 6 - 20 mg/dL 17  Creatinine 0.61 - 1.24 mg/dL 1.44(H)  Sodium 135 - 145 mmol/L 139  Potassium 3.5 - 5.1 mmol/L 3.8  Chloride 98 - 111 mmol/L 107  CO2 22 - 32 mmol/L 25  Calcium 8.9 - 10.3 mg/dL 8.6(L)   Total Protein 6.5 - 8.1 g/dL 6.5  Total Bilirubin 0.3 - 1.2 mg/dL 0.7  Alkaline Phos 38 - 126 U/L 47  AST 15 - 41 U/L 22  ALT 0 - 44 U/L 17   CBC Latest Ref Rng & Units 12/24/2018  WBC 4.0 - 10.5 K/uL 3.4(L)  Hemoglobin 13.0 - 17.0 g/dL 13.7  Hematocrit 39.0 - 52.0 % 39.8  Platelets 150 - 400 K/uL 155      Assessment and plan- Patient is a 48 y.o. male with multiple myeloma on maintenance revlimid here for routine f/u  Patient is doing well on revlimid 5 mg daily. No M protein on spep or immunofixation. FLC ratio is normal. Cbc is normal. He has ckd stable around 1.5.  Bone survey shows lytic lesions in his skull, clavicle and b/l humeri  likely chronic.  He will receive xgeva today and in 1 month. I will see him back in 2 months   Visit Diagnosis 1. Multiple myeloma in remission ([place])   2. Chemotherapy-induced peripheral neuropathy ([place])   3. High risk medication use   4. Long term (current) use of bisphosphonates      Dr. Randa Evens, MD, MPH [facility] at [facility] [phone] 12/28/2018 4:32 PM

## 2019-01-11 MED FILL — GABAPENTIN 600 MG TABLET: 600 | 30 days supply | Qty: 90 | Fill #1

## 2019-01-11 MED FILL — BACLOFEN 10 MG TABS: 10 | 30 days supply | Qty: 240 | Fill #0

## 2019-01-11 MED FILL — PREGABALIN 150 MG CAPS: 150 | 30 days supply | Qty: 90 | Fill #1

## 2019-01-14 MED FILL — oxyCODONE HCL 5 MG TABS: 5 | 30 days supply | Qty: 90 | Fill #0

## 2019-01-21 ENCOUNTER — Encounter: Payer: Self-pay | Admitting: *Deleted

## 2019-01-21 ENCOUNTER — Inpatient Hospital Stay: Payer: No Typology Code available for payment source

## 2019-01-21 MED FILL — SERTRALINE HCL 25 MG TABLET: 25 | 30 days supply | Qty: 30 | Fill #1

## 2019-01-22 ENCOUNTER — Encounter: Payer: Self-pay | Admitting: *Deleted

## 2019-01-25 ENCOUNTER — Inpatient Hospital Stay: Payer: No Typology Code available for payment source

## 2019-01-26 ENCOUNTER — Other Ambulatory Visit: Payer: Self-pay | Admitting: Oncology

## 2019-01-26 DIAGNOSIS — C9001 Multiple myeloma in remission: Secondary | ICD-10-CM

## 2019-01-27 ENCOUNTER — Telehealth: Payer: Self-pay | Admitting: *Deleted

## 2019-01-27 ENCOUNTER — Other Ambulatory Visit: Payer: Self-pay | Admitting: *Deleted

## 2019-01-27 DIAGNOSIS — C9001 Multiple myeloma in remission: Secondary | ICD-10-CM

## 2019-01-27 MED ORDER — LENALIDOMIDE 5 MG PO CAPS: ORAL_CAPSULE | ORAL | 0 refills | Status: DC

## 2019-01-27 NOTE — Telephone Encounter (Signed)
Called pt cell and got voicemail, I called to ask if he could call me back or send me my chart message about r/s his appt for lab and xgeva. He had to cancel and was going to be out of town until 10/6. Just want to make sure that we reschedule him. Left my desk phone number or he can my chart message.

## 2019-01-29 MED FILL — SERTRALINE HCL 25 MG TABLET: 25 | 30 days supply | Qty: 30 | Fill #1

## 2019-02-09 ENCOUNTER — Encounter: Payer: Self-pay | Admitting: Pain Medicine

## 2019-02-10 ENCOUNTER — Encounter: Payer: Self-pay | Admitting: *Deleted

## 2019-02-10 ENCOUNTER — Ambulatory Visit: Payer: No Typology Code available for payment source | Attending: Pain Medicine | Admitting: Pain Medicine

## 2019-02-10 ENCOUNTER — Other Ambulatory Visit: Payer: Self-pay

## 2019-02-10 DIAGNOSIS — G894 Chronic pain syndrome: Secondary | ICD-10-CM

## 2019-02-10 DIAGNOSIS — M79605 Pain in left leg: Secondary | ICD-10-CM

## 2019-02-10 DIAGNOSIS — M25561 Pain in right knee: Secondary | ICD-10-CM | POA: Diagnosis not present

## 2019-02-10 DIAGNOSIS — G8929 Other chronic pain: Secondary | ICD-10-CM

## 2019-02-10 DIAGNOSIS — M545 Low back pain: Secondary | ICD-10-CM | POA: Diagnosis not present

## 2019-02-10 DIAGNOSIS — M62830 Muscle spasm of back: Secondary | ICD-10-CM

## 2019-02-10 DIAGNOSIS — M792 Neuralgia and neuritis, unspecified: Secondary | ICD-10-CM

## 2019-02-10 DIAGNOSIS — M7918 Myalgia, other site: Secondary | ICD-10-CM

## 2019-02-10 DIAGNOSIS — M25562 Pain in left knee: Secondary | ICD-10-CM

## 2019-02-10 DIAGNOSIS — M79604 Pain in right leg: Secondary | ICD-10-CM

## 2019-02-10 MED ORDER — OXYCODONE HCL 5 MG PO TABS: 5.0000 mg | ORAL_TABLET | Freq: Three times a day (TID) | ORAL | 0 refills | Status: DC | PRN

## 2019-02-10 MED ORDER — GABAPENTIN 600 MG PO TABS: 600.0000 mg | ORAL_TABLET | Freq: Three times a day (TID) | ORAL | 5 refills | Status: DC

## 2019-02-10 MED ORDER — BACLOFEN 10 MG PO TABS: 10.0000 mg | ORAL_TABLET | Freq: Four times a day (QID) | ORAL | 5 refills | Status: DC

## 2019-02-10 MED FILL — BACLOFEN 10 MG TABS: 10 | 30 days supply | Qty: 240 | Fill #0

## 2019-02-10 MED FILL — GABAPENTIN 600 MG TABLET: 600 | 30 days supply | Qty: 90 | Fill #2

## 2019-02-10 NOTE — Progress Notes (Signed)
Pain Management Virtual Encounter Note - Virtual Visit via Telephone Telehealth (real-time audio visits between healthcare provider and patient).   Patient's Phone No. & Preferred Pharmacy:  [phone] (home); [phone] (mobile); (Preferred) [phone] [email or web address]  [name] [address] Phone: [phone] Fax: [phone]  [facility] - [place], [address] [place] [address] Phone: [phone] Fax: [phone]    Pre-screening note:  Our staff contacted James House and offered him an "in person", "face-to-face" appointment versus a telephone encounter. He indicated preferring the telephone encounter, at this time.   Reason for Virtual Visit: COVID-19*  Social distancing based on CDC and AMA recommendations.   I contacted James House. on 02/10/2019 via telephone.      I clearly identified myself as James Cola, MD. I verified that I was speaking with the correct person using two identifiers (Name: James House., and date of birth: [birth year]).  Advanced Informed Consent I sought verbal advanced consent from James House. for virtual visit interactions. I informed James House of possible security and privacy concerns, risks, and limitations associated with providing "not-in-person" medical evaluation and management services. I also informed James House of the availability of "in-person" appointments. Finally, I informed him that there would be a charge for the virtual visit and that he could be  personally, fully or partially, financially responsible for it. Mr. James House expressed understanding and agreed to proceed.   Historic Elements   Mr. James House. is a 48 y.o. year old, male patient evaluated today after his last encounter by our practice on 11/11/2018. James House  has a past medical history of CKD (chronic kidney  disease) stage 3, GFR 30-59 ml/min, Hypertension, Multiple myeloma ([place]) (08/11/2017), Neuropathy, and Pneumonia. He also  has a past surgical history that includes Joint replacement; Knee surgery (Left); and Abdominal surgery. James House has a current medication list which includes the following prescription(s): aspirin ec, baclofen, gabapentin, lenalidomide, oxycodone, oxycodone, oxycodone, and sertraline. He  reports that he quit smoking about 5 years ago. His smokeless tobacco use includes snuff. He reports that he does not drink alcohol or use drugs. Mr. Smoker has No Known Allergies.   HPI  Today, he is being contacted for medication management.  The patient indicates doing well with the current medication regimen. No adverse reactions or side effects reported to the medications.   Pharmacotherapy Assessment  Analgesic: Oxycodone IR 5 mg, 1 tab PO BID (10 mg/day of oxycodone) MME/day: 15 mg/day.   Monitoring: Pharmacotherapy: No side-effects or adverse reactions reported. [place] PMP: PDMP reviewed during this encounter.       Compliance: No problems identified. Effectiveness: Clinically acceptable. Plan: Refer to "POC".  UDS:  Summary  Date Value Ref Range Status  04/02/2018 FINAL  Final    Comment:    ==================================================================== TOXASSURE SELECT 13 (MW) ==================================================================== Test                             Result       Flag       Units Drug Present and Declared for Prescription Verification   Oxycodone                      138          EXPECTED   ng/mg creat  Oxymorphone                    52           EXPECTED   ng/mg creat   Noroxycodone                   135          EXPECTED   ng/mg creat    Sources of oxycodone include scheduled prescription medications.    Oxymorphone and noroxycodone are expected metabolites of    oxycodone. Oxymorphone is also available as a scheduled    prescription  medication. ==================================================================== Test                      Result    Flag   Units      Ref Range   Creatinine              168              mg/dL      >=20 ==================================================================== Declared Medications:  The flagging and interpretation on this report are based on the  following declared medications.  Unexpected results may arise from  inaccuracies in the declared medications.  **Note: The testing scope of this panel includes these medications:  Oxycodone  **Note: The testing scope of this panel does not include following  reported medications:  Acetaminophen  Acyclovir  Amoxicillin (Augmentin)  Aspirin (Aspirin 81)  Baclofen  Clavulinate (Augmentin)  Gabapentin  Lenalidomide  Meloxicam  Pantoprazole  Prednisone  Pregabalin  Promethazine  Vitamin D2 (Ergocalciferol) ==================================================================== For clinical consultation, please call [phone]. ====================================================================    Laboratory Chemistry Profile (12 mo)  Renal: 12/24/2018: BUN 17; Creatinine, Ser 1.44  Lab Results  Component Value Date   GFRAA >60 12/24/2018   GFRNONAA 57 (L) 12/24/2018   Hepatic: 12/24/2018: Albumin 4.0 Lab Results  Component Value Date   AST 22 12/24/2018   ALT 17 12/24/2018   Other: No results found for requested labs within last 8760 hours. Note: Above Lab results reviewed.  Imaging  Last 90 days:  No results found.  Assessment  The primary encounter diagnosis was Chronic pain syndrome. Diagnoses of Chronic low back pain (Primary Area of Pain) (Bilateral) (L>R) w/o sciatica, Chronic lower extremity pain (Secondary Area of Pain) (Bilateral) (L>R), Chronic knee pain (Tertiary Area of Pain) (Bilateral) (L>R), Spasm of back muscles, Chronic musculoskeletal pain, Neuropathic pain, and Neurogenic pain were also pertinent  to this visit.  Plan of Care  I am having James House. start on oxyCODONE and oxyCODONE. I am also having him maintain his aspirin EC, sertraline, lenalidomide, baclofen, gabapentin, and oxyCODONE.  Pharmacotherapy (Medications Ordered): Meds ordered this encounter  Medications  . baclofen (LIORESAL) 10 MG tablet    Sig: Take 1-2 tablets (10-20 mg total) by mouth 4 (four) times daily.    Dispense:  240 tablet    Refill:  5    Fill one day early if pharmacy is closed on scheduled refill date. May substitute for generic if available.  . gabapentin (NEURONTIN) 600 MG tablet    Sig: Take 1 tablet (600 mg total) by mouth 3 (three) times daily.    Dispense:  90 tablet    Refill:  5    Fill one day early if pharmacy is closed on scheduled refill date. May substitute for generic if available.  Marland Kitchen oxyCODONE (OXY IR/ROXICODONE) 5 MG immediate release tablet  Sig: Take 1 tablet (5 mg total) by mouth every 8 (eight) hours as needed for severe pain. Must last 30 days    Dispense:  90 tablet    Refill:  0    Chronic Pain: STOP Act (Not applicable) Fill 1 day early if closed on refill date. Do not fill until: 02/13/2019. To last until: 03/15/2019. Avoid benzodiazepines within 8 hours of opioids  . oxyCODONE (OXY IR/ROXICODONE) 5 MG immediate release tablet    Sig: Take 1 tablet (5 mg total) by mouth every 8 (eight) hours as needed for severe pain. Must last 30 days    Dispense:  90 tablet    Refill:  0    Chronic Pain: STOP Act (Not applicable) Fill 1 day early if closed on refill date. Do not fill until: 03/15/2019. To last until: 04/14/2019. Avoid benzodiazepines within 8 hours of opioids  . oxyCODONE (OXY IR/ROXICODONE) 5 MG immediate release tablet    Sig: Take 1 tablet (5 mg total) by mouth every 8 (eight) hours as needed for severe pain. Must last 30 days    Dispense:  90 tablet    Refill:  0    Chronic Pain: STOP Act (Not applicable) Fill 1 day early if closed on refill date. Do not  fill until: 04/14/2019. To last until: 05/14/2019. Avoid benzodiazepines within 8 hours of opioids   Orders:  No orders of the defined types were placed in this encounter.  Follow-up plan:   Return in about 13 weeks (around 05/12/2019) for (VV), (MM).      Interventional management options:  Considering:   DiagnosticbilateralLESI Possible bilateral lumbar facet RFA Diagnostic bilateral sacroiliac joint block Possible bilateral sacroiliac joint RFA Diagnostic right knee Hyalgan series Diagnosticright knee intra-articular knee injection Diagnostic left knee genicular nerve block   Palliative PRN treatment(s):   Palliative bilateral IA hip joint injection #2 (50/50/50/50) Palliative bilateral lumbar facet block #3 (100/100/0) (60/60/60) Palliative left piriformis muscle injection #2 (100/100/0)    Recent Visits No visits were found meeting these conditions.  Showing recent visits within past 90 days and meeting all other requirements   Today's Visits Date Type Provider Dept  02/10/19 Office Visit Milinda Pointer, MD [facility]  Showing today's visits and meeting all other requirements   Future Appointments No visits were found meeting these conditions.  Showing future appointments within next 90 days and meeting all other requirements   I discussed the assessment and treatment plan with the patient. The patient was provided an opportunity to ask questions and all were answered. The patient agreed with the plan and demonstrated an understanding of the instructions.  Patient advised to call back or seek an in-person evaluation if the symptoms or condition worsens.  Total duration of non-face-to-face encounter: 12 minutes.  Note by: James Cola, MD Date: 02/10/2019; Time: 8:52 AM  Note: This dictation was prepared with Dragon dictation. Any transcriptional errors that may result from this process are unintentional.  Disclaimer:  * Given the  special circumstances of the COVID-19 pandemic, the federal government has announced that the Office for Civil Rights (OCR) will exercise its enforcement discretion and will not impose penalties on physicians using telehealth in the event of noncompliance with regulatory requirements under the [place] (HIPAA) in connection with the good faith provision of telehealth during the LPNPY-05 national public health emergency. ([place])

## 2019-02-10 NOTE — Patient Instructions (Signed)
____________________________________________________________________________________________  Preparing for Procedure with Sedation  Procedure appointments are limited to planned procedures: . No Prescription Refills. . No disability issues will be discussed. . No medication changes will be discussed.  Instructions: . Oral Intake: Do not eat or drink anything for at least 8 hours prior to your procedure. . Transportation: Public transportation is not allowed. Bring an adult driver. The driver must be physically present in our waiting room before any procedure can be started. . Physical Assistance: Bring an adult physically capable of assisting you, in the event you need help. This adult should keep you company at home for at least 6 hours after the procedure. . Blood Pressure Medicine: Take your blood pressure medicine with a sip of water the morning of the procedure. . Blood thinners: Notify our staff if you are taking any blood thinners. Depending on which one you take, there will be specific instructions on how and when to stop it. . Diabetics on insulin: Notify the staff so that you can be scheduled 1st case in the morning. If your diabetes requires high dose insulin, take only  of your normal insulin dose the morning of the procedure and notify the staff that you have done so. . Preventing infections: Shower with an antibacterial soap the morning of your procedure. . Build-up your immune system: Take 1000 mg of Vitamin C with every meal (3 times a day) the day prior to your procedure. . Antibiotics: Inform the staff if you have a condition or reason that requires you to take antibiotics before dental procedures. . Pregnancy: If you are pregnant, call and cancel the procedure. . Sickness: If you have a cold, fever, or any active infections, call and cancel the procedure. . Arrival: You must be in the facility at least 30 minutes prior to your scheduled procedure. . Children: Do not bring  children with you. . Dress appropriately: Bring dark clothing that you would not mind if they get stained. . Valuables: Do not bring any jewelry or valuables.  Reasons to call and reschedule or cancel your procedure: (Following these recommendations will minimize the risk of a serious complication.) . Surgeries: Avoid having procedures within 2 weeks of any surgery. (Avoid for 2 weeks before or after any surgery). . Flu Shots: Avoid having procedures within 2 weeks of a flu shots or . (Avoid for 2 weeks before or after immunizations). . Barium: Avoid having a procedure within 7-10 days after having had a radiological study involving the use of radiological contrast. (Myelograms, Barium swallow or enema study). . Heart attacks: Avoid any elective procedures or surgeries for the initial 6 months after a "Myocardial Infarction" (Heart Attack). . Blood thinners: It is imperative that you stop these medications before procedures. Let us know if you if you take any blood thinner.  . Infection: Avoid procedures during or within two weeks of an infection (including chest colds or gastrointestinal problems). Symptoms associated with infections include: Localized redness, fever, chills, night sweats or profuse sweating, burning sensation when voiding, cough, congestion, stuffiness, runny nose, sore throat, diarrhea, nausea, vomiting, cold or Flu symptoms, recent or current infections. It is specially important if the infection is over the area that we intend to treat. . Heart and lung problems: Symptoms that may suggest an active cardiopulmonary problem include: cough, chest pain, breathing difficulties or shortness of breath, dizziness, ankle swelling, uncontrolled high or unusually low blood pressure, and/or palpitations. If you are experiencing any of these symptoms, cancel your procedure and contact   your primary care physician for an evaluation.  Remember:  Regular Business hours are:  Monday to Thursday  8:00 AM to 4:00 PM  Provider's Schedule: Milinda Pointer, MD:  Procedure days: Tuesday and Thursday 7:30 AM to 4:00 PM  Gillis Santa, MD:  Procedure days: Monday and Wednesday 7:30 AM to 4:00 PM ____________________________________________________________________________________________   ____________________________________________________________________________________________  Medication Rules  Purpose: To inform patients, and their family members, of our rules and regulations.  Applies to: All patients receiving prescriptions (written or electronic).  Pharmacy of record: Pharmacy where electronic prescriptions will be sent. If written prescriptions are taken to a different pharmacy, please inform the nursing staff. The pharmacy listed in the electronic medical record should be the one where you would like electronic prescriptions to be sent.  Electronic prescriptions: In compliance with the [place] (STOP) Act of 2017 (Session Lanny Cramp [phone]), effective April 22, 2018, all controlled substances must be electronically prescribed. Calling prescriptions to the pharmacy will cease to exist.  Prescription refills: Only during scheduled appointments. Applies to all prescriptions.  NOTE: The following applies primarily to controlled substances (Opioid* Pain Medications).   Patient's responsibilities: 1. Pain Pills: Bring all pain pills to every appointment (except for procedure appointments). 2. Pill Bottles: Bring pills in original pharmacy bottle. Always bring the newest bottle. Bring bottle, even if empty. 3. Medication refills: You are responsible for knowing and keeping track of what medications you take and those you need refilled. The day before your appointment: write a list of all prescriptions that need to be refilled. The day of the appointment: give the list to the admitting nurse. Prescriptions will be written only during  appointments. No prescriptions will be written on procedure days. If you forget a medication: it will not be "Called in", "Faxed", or "electronically sent". You will need to get another appointment to get these prescribed. No early refills. Do not call asking to have your prescription filled early. 4. Prescription Accuracy: You are responsible for carefully inspecting your prescriptions before leaving our office. Have the discharge nurse carefully go over each prescription with you, before taking them home. Make sure that your name is accurately spelled, that your address is correct. Check the name and dose of your medication to make sure it is accurate. Check the number of pills, and the written instructions to make sure they are clear and accurate. Make sure that you are given enough medication to last until your next medication refill appointment. 5. Taking Medication: Take medication as prescribed. When it comes to controlled substances, taking less pills or less frequently than prescribed is permitted and encouraged. Never take more pills than instructed. Never take medication more frequently than prescribed.  6. Inform other Doctors: Always inform, all of your healthcare providers, of all the medications you take. 7. Pain Medication from other Providers: You are not allowed to accept any additional pain medication from any other Doctor or Healthcare provider. There are two exceptions to this rule. (see below) In the event that you require additional pain medication, you are responsible for notifying us, as stated below. 8. Medication Agreement: You are responsible for carefully reading and following our Medication Agreement. This must be signed before receiving any prescriptions from our practice. Safely store a copy of your signed Agreement. Violations to the Agreement will result in no further prescriptions. (Additional copies of our Medication Agreement are available upon request.) 9. Laws, Rules,  & Regulations: All patients are expected to follow all Federal  and State Laws, Statutes, Rules, & Regulations. Ignorance of the Laws does not constitute a valid excuse. The use of any illegal substances is prohibited. 10. Adopted CDC guidelines & recommendations: Target dosing levels will be at or below 60 MME/day. Use of benzodiazepines** is not recommended.  Exceptions: There are only two exceptions to the rule of not receiving pain medications from other Healthcare Providers. 1. Exception #1 (Emergencies): In the event of an emergency (i.e.: accident requiring emergency care), you are allowed to receive additional pain medication. However, you are responsible for: As soon as you are able, call our office [phone], at any time of the day or night, and leave a message stating your name, the date and nature of the emergency, and the name and dose of the medication prescribed. In the event that your call is answered by a member of our staff, make sure to document and save the date, time, and the name of the person that took your information.  2. Exception #2 (Planned Surgery): In the event that you are scheduled by another doctor or dentist to have any type of surgery or procedure, you are allowed (for a period no longer than 30 days), to receive additional pain medication, for the acute post-op pain. However, in this case, you are responsible for picking up a copy of our "Post-op Pain Management for Surgeons" handout, and giving it to your surgeon or dentist. This document is available at our office, and does not require an appointment to obtain it. Simply go to our office during business hours (Monday-Thursday from 8:00 AM to 4:00 PM) (Friday 8:00 AM to 12:00 Noon) or if you have a scheduled appointment with us, prior to your surgery, and ask for it by name. In addition, you will need to provide us with your name, name of your surgeon, type of surgery, and date of procedure or surgery.  *Opioid  medications include: morphine, codeine, oxycodone, oxymorphone, hydrocodone, hydromorphone, meperidine, tramadol, tapentadol, buprenorphine, fentanyl, methadone. **Benzodiazepine medications include: diazepam (Valium), alprazolam (Xanax), clonazepam (Klonopine), lorazepam (Ativan), clorazepate (Tranxene), chlordiazepoxide (Librium), estazolam (Prosom), oxazepam (Serax), temazepam (Restoril), triazolam (Halcion) (Last updated: 06/19/2017) ____________________________________________________________________________________________   ____________________________________________________________________________________________  Medication Recommendations and Reminders  Applies to: All patients receiving prescriptions (written and/or electronic).  Medication Rules & Regulations: These rules and regulations exist for your safety and that of others. They are not flexible and neither are we. Dismissing or ignoring them will be considered "non-compliance" with medication therapy, resulting in complete and irreversible termination of such therapy. (See document titled "Medication Rules" for more details.) In all conscience, because of safety reasons, we cannot continue providing a therapy where the patient does not follow instructions.  Pharmacy of record:   Definition: This is the pharmacy where your electronic prescriptions will be sent.   We do not endorse any particular pharmacy.  You are not restricted in your choice of pharmacy.  The pharmacy listed in the electronic medical record should be the one where you want electronic prescriptions to be sent.  If you choose to change pharmacy, simply notify our nursing staff of your choice of new pharmacy.  Recommendations:  Keep all of your pain medications in a safe place, under lock and key, even if you live alone.   After you fill your prescription, take 1 week's worth of pills and put them away in a safe place. You should keep a separate,  properly labeled bottle for this purpose. The remainder should be kept in the original bottle. Use   this as your primary supply, until it runs out. Once it's gone, then you know that you have 1 week's worth of medicine, and it is time to come in for a prescription refill. If you do this correctly, it is unlikely that you will ever run out of medicine.  To make sure that the above recommendation works, it is very important that you make sure your medication refill appointments are scheduled at least 1 week before you run out of medicine. To do this in an effective manner, make sure that you do not leave the office without scheduling your next medication management appointment. Always ask the nursing staff to show you in your prescription , when your medication will be running out. Then arrange for the receptionist to get you a return appointment, at least 7 days before you run out of medicine. Do not wait until you have 1 or 2 pills left, to come in. This is very poor planning and does not take into consideration that we may need to cancel appointments due to bad weather, sickness, or emergencies affecting our staff.  "Partial Fill": If for any reason your pharmacy does not have enough pills/tablets to completely fill or refill your prescription, do not allow for a "partial fill". You will need a separate prescription to fill the remaining amount, which we will not provide. If the reason for the partial fill is your insurance, you will need to talk to the pharmacist about payment alternatives for the remaining tablets, but again, do not accept a partial fill.  Prescription refills and/or changes in medication(s):   Prescription refills, and/or changes in dose or medication, will be conducted only during scheduled medication management appointments. (Applies to both, written and electronic prescriptions.)  No refills on procedure days. No medication will be changed or started on procedure days. No changes,  adjustments, and/or refills will be conducted on a procedure day. Doing so will interfere with the diagnostic portion of the procedure.  No phone refills. No medications will be "called into the pharmacy".  No Fax refills.  No weekend refills.  No Holliday refills.  No after hours refills.  Remember:  Business hours are:  Monday to Thursday 8:00 AM to 4:00 PM Provider's Schedule: Milinda Pointer, MD - Appointments are:  Medication management: Monday and Wednesday 8:00 AM to 4:00 PM Procedure day: Tuesday and Thursday 7:30 AM to 4:00 PM Gillis Santa, MD - Appointments are:  Medication management: Tuesday and Thursday 8:00 AM to 4:00 PM Procedure day: Monday and Wednesday 7:30 AM to 4:00 PM (Last update: 06/19/2017) ____________________________________________________________________________________________

## 2019-02-13 MED FILL — oxyCODONE HCL 5 MG TABS: 5 | 30 days supply | Qty: 90 | Fill #0

## 2019-02-14 ENCOUNTER — Other Ambulatory Visit: Payer: Self-pay | Admitting: *Deleted

## 2019-02-14 DIAGNOSIS — C9001 Multiple myeloma in remission: Secondary | ICD-10-CM

## 2019-02-16 NOTE — Progress Notes (Signed)
Patient is coming in for follow up he is doing ok he mentions pain in his back.

## 2019-02-18 ENCOUNTER — Inpatient Hospital Stay: Payer: No Typology Code available for payment source | Attending: Oncology

## 2019-02-18 ENCOUNTER — Other Ambulatory Visit: Payer: No Typology Code available for payment source

## 2019-02-18 ENCOUNTER — Inpatient Hospital Stay: Payer: No Typology Code available for payment source

## 2019-02-18 ENCOUNTER — Ambulatory Visit: Payer: No Typology Code available for payment source | Admitting: Oncology

## 2019-02-18 ENCOUNTER — Ambulatory Visit: Payer: No Typology Code available for payment source

## 2019-02-18 ENCOUNTER — Inpatient Hospital Stay (HOSPITAL_BASED_OUTPATIENT_CLINIC_OR_DEPARTMENT_OTHER): Payer: No Typology Code available for payment source | Admitting: Oncology

## 2019-02-18 ENCOUNTER — Other Ambulatory Visit: Payer: Self-pay

## 2019-02-18 VITALS — BP 133/99 | HR 63 | Temp 97.9°F | Resp 16 | Wt 145.8 lb

## 2019-02-18 DIAGNOSIS — Z9481 Bone marrow transplant status: Secondary | ICD-10-CM

## 2019-02-18 DIAGNOSIS — Z7983 Long term (current) use of bisphosphonates: Secondary | ICD-10-CM

## 2019-02-18 DIAGNOSIS — R7989 Other specified abnormal findings of blood chemistry: Secondary | ICD-10-CM

## 2019-02-18 DIAGNOSIS — C9001 Multiple myeloma in remission: Secondary | ICD-10-CM | POA: Diagnosis present

## 2019-02-18 DIAGNOSIS — Z299 Encounter for prophylactic measures, unspecified: Secondary | ICD-10-CM

## 2019-02-18 DIAGNOSIS — Z79899 Other long term (current) drug therapy: Secondary | ICD-10-CM | POA: Diagnosis not present

## 2019-02-18 LAB — COMPREHENSIVE METABOLIC PANEL
ALT: 17 U/L (ref 0–44)
AST: 21 U/L (ref 15–41)
Albumin: 3.9 g/dL (ref 3.5–5.0)
Alkaline Phosphatase: 43 U/L (ref 38–126)
Anion gap: 7 (ref 5–15)
BUN: 19 mg/dL (ref 6–20)
CO2: 27 mmol/L (ref 22–32)
Calcium: 8.7 mg/dL — ABNORMAL LOW (ref 8.9–10.3)
Chloride: 104 mmol/L (ref 98–111)
Creatinine, Ser: 1.45 mg/dL — ABNORMAL HIGH (ref 0.61–1.24)
GFR calc Af Amer: >60 mL/min (ref >60)
GFR calc non Af Amer: 57 mL/min — ABNORMAL LOW (ref >60)
Glucose, Bld: 84 mg/dL (ref 70–99)
Potassium: 4.2 mmol/L (ref 3.5–5.1)
Sodium: 138 mmol/L (ref 135–145)
Total Bilirubin: 0.5 mg/dL (ref 0.3–1.2)
Total Protein: 6.5 g/dL (ref 6.5–8.1)

## 2019-02-18 LAB — CBC WITH DIFFERENTIAL/PLATELET
Abs Immature Granulocytes: 0.01 10*3/uL (ref 0.00–0.07)
Basophils Absolute: 0.1 10*3/uL (ref 0.0–0.1)
Basophils Relative: 1 %
Eosinophils Absolute: 0.2 10*3/uL (ref 0.0–0.5)
Eosinophils Relative: 5 %
HCT: 37.9 % — ABNORMAL LOW (ref 39.0–52.0)
Hemoglobin: 13.1 g/dL (ref 13.0–17.0)
Immature Granulocytes: 0 %
Lymphocytes Relative: 24 %
Lymphs Abs: 0.9 10*3/uL (ref 0.7–4.0)
MCH: 34 pg (ref 26.0–34.0)
MCHC: 34.6 g/dL (ref 30.0–36.0)
MCV: 98.4 fL (ref 80.0–100.0)
Monocytes Absolute: 0.5 10*3/uL (ref 0.1–1.0)
Monocytes Relative: 13 %
Neutro Abs: 2.2 10*3/uL (ref 1.7–7.7)
Neutrophils Relative %: 57 %
Platelets: 146 10*3/uL — ABNORMAL LOW (ref 150–400)
RBC: 3.85 MIL/uL — ABNORMAL LOW (ref 4.22–5.81)
RDW: 12.8 % (ref 11.5–15.5)
WBC: 3.8 10*3/uL — ABNORMAL LOW (ref 4.0–10.5)
nRBC: 0 % (ref 0.0–0.2)

## 2019-02-18 MED ORDER — DENOSUMAB 120 MG/1.7ML ~~LOC~~ SOLN
120.0000 mg | Freq: Once | SUBCUTANEOUS | Status: AC
  Administered 2019-02-18: 16:00:00 120 mg via SUBCUTANEOUS
  Filled 2019-02-18: qty 1.7

## 2019-02-19 ENCOUNTER — Other Ambulatory Visit: Payer: Self-pay | Admitting: Oncology

## 2019-02-19 ENCOUNTER — Encounter: Payer: Self-pay | Admitting: Oncology

## 2019-02-19 DIAGNOSIS — C9001 Multiple myeloma in remission: Secondary | ICD-10-CM

## 2019-02-19 NOTE — Progress Notes (Signed)
Hematology/Oncology Consult note [facility]  Telephone:([phone] [phone]  Patient Care Team: Pleas Koch, NP as PCP - General (Internal Medicine) Vella Redhead, MD as PCP - Hematology/Oncology   Name of the patient: James House  [id number]  01-29-1971   Date of visit: 02/19/19  Diagnosis-  light chain kappa multiple myeloma s/p ASCT currently in remission. Patient on maintenance revlimid  Chief complaint/ Reason for visit-routine follow-up of multiple myeloma on maintenance Revlimid  Heme/Onc history: Patient is a 48 year old male with a history of kappa light chain myeloma that was treated by [name]. History is as follows:  1. Patient presented with renal insufficiency with a creatinine of 2.4 in April 2018. Kidney biopsy showed myeloma cast nephropathy. Kappa light chain was 3004 and lambda 0.22 with a kappa lambda ratio of 13,000 655. Skeletal survey showed multiple calvarial and marrow lesions. Bone marrow biopsy in May 2018 showed 43% plasma cells. He received CyBorDfor cycle 1 in May 2018 and was subsequently switched to RVD. He received 4 cycles followed by a repeat bone marrow biopsy in August 2018 which showed no increase in plasma cells.  2.He underwent autologous stem cell transplantation on 01/30/2017. He was started on Revlimid maintenance 10 mg daily in January 2019.  3.Labs from January February and March 2019 done at [facility] showed no M spike, normal kappa lambda ratio of 1.06. He was last seen by them in April 2019.  Following that patient was admitted to [facility] for healthcare associated pneumonia and was recently discharged. He wished to transfer his care to [place] at this time.  With regards to his myeloma patient is currently on 10 mg of Revlimid daily. He is also on acyclovir 400 mg twice daily which he is to continue for a minimum of 1 year post transplant. Patient has chronic back pain as well as  chemo-induced peripheral neuropathy for which he was seen by [name] in the past and is currently seeing [facility] pain clinic and is under pain contract with them  Interval history-neuropathy continues to be an issue for him which makes it difficult for him to work.  Also has chronic back pain.  Denies any new complaints.  He works as an Engineer, site- 1 Pain scale- 3 Opioid associated constipation- no  Review of systems- Review of Systems  Constitutional: Negative for chills, fever, malaise/fatigue and weight loss.  HENT: Negative for congestion, ear discharge and nosebleeds.   Eyes: Negative for blurred vision.  Respiratory: Negative for cough, hemoptysis, sputum production, shortness of breath and wheezing.   Cardiovascular: Negative for chest pain, palpitations, orthopnea and claudication.  Gastrointestinal: Negative for abdominal pain, blood in stool, constipation, diarrhea, heartburn, melena, nausea and vomiting.  Genitourinary: Negative for dysuria, flank pain, frequency, hematuria and urgency.  Musculoskeletal: Negative for back pain, joint pain and myalgias.  Skin: Negative for rash.  Neurological: Positive for sensory change (Peripheral neuropathy). Negative for dizziness, tingling, focal weakness, seizures, weakness and headaches.  Endo/Heme/Allergies: Does not bruise/bleed easily.  Psychiatric/Behavioral: Negative for depression and suicidal ideas. The patient does not have insomnia.        No Known Allergies   Past Medical History:  Diagnosis Date  . CKD (chronic kidney disease) stage 3, GFR 30-59 ml/min   . Hypertension   . Multiple myeloma ([place]) 08/11/2017  . Neuropathy   . Pneumonia      Past Surgical History:  Procedure Laterality Date  . ABDOMINAL SURGERY    .  JOINT REPLACEMENT     right knee  . KNEE SURGERY Left     Social History   Socioeconomic History  . Marital status: Married    Spouse name: [name]  . Number of children: 3  .  Years of education: Not on file  . Highest education level: Not on file  Occupational History  . Not on file  Social Needs  . Financial resource strain: Not hard at all  . Food insecurity    Worry: Never true    Inability: Never true  . Transportation needs    Medical: No    Non-medical: No  Tobacco Use  . Smoking status: Former Smoker    Quit date: 08/19/2013    Years since quitting: 5.5  . Smokeless tobacco: Current User    Types: Snuff  Substance and Sexual Activity  . Alcohol use: No  . Drug use: No  . Sexual activity: Yes  Lifestyle  . Physical activity    Days per week: 7 days    Minutes per session: 30 min  . Stress: Only a little  Relationships  . Social Herbalist on phone: Once a week    Gets together: Once a week    Attends religious service: More than 4 times per year    Active member of club or organization: No    Attends meetings of clubs or organizations: Never    Relationship status: Married  . Intimate partner violence    Fear of current or ex partner: No    Emotionally abused: No    Physically abused: No    Forced sexual activity: No  Other Topics Concern  . Not on file  Social History Narrative   Live in private residence with spouse and mother in law    Family History  Problem Relation Age of Onset  . Cancer Mother 7       Pancreatic  . COPD Mother   . Diabetes Mother   . Hyperlipidemia Mother   . Hypertension Mother   . Cancer Maternal Uncle        pancreatic  . Cancer Maternal Grandmother 33       colon  . COPD Maternal Grandmother   . Diabetes Maternal Grandmother   . Hypertension Maternal Grandmother      Current Outpatient Medications:  .  aspirin EC 81 MG tablet, Take 81 mg by mouth daily., Disp: , Rfl:  .  baclofen (LIORESAL) 10 MG tablet, Take 1-2 tablets (10-20 mg total) by mouth 4 (four) times daily., Disp: 240 tablet, Rfl: 5 .  gabapentin (NEURONTIN) 600 MG tablet, Take 1 tablet (600 mg total) by mouth 3  (three) times daily., Disp: 90 tablet, Rfl: 5 .  oxyCODONE (OXY IR/ROXICODONE) 5 MG immediate release tablet, Take 1 tablet (5 mg total) by mouth every 8 (eight) hours as needed for severe pain. Must last 30 days, Disp: 90 tablet, Rfl: 0 .  [START ON 03/15/2019] oxyCODONE (OXY IR/ROXICODONE) 5 MG immediate release tablet, Take 1 tablet (5 mg total) by mouth every 8 (eight) hours as needed for severe pain. Must last 30 days, Disp: 90 tablet, Rfl: 0 .  [START ON 04/14/2019] oxyCODONE (OXY IR/ROXICODONE) 5 MG immediate release tablet, Take 1 tablet (5 mg total) by mouth every 8 (eight) hours as needed for severe pain. Must last 30 days, Disp: 90 tablet, Rfl: 0 .  sertraline (ZOLOFT) 25 MG tablet, Take 25 mg by mouth daily., Disp: , Rfl:  .  lenalidomide (REVLIMID) 5 MG capsule, TAKE 1 CAPSULE BY MOUTH DAILY, Disp: 28 capsule, Rfl: 0  Physical exam:  Vitals:   02/18/19 1520  BP: (!) 133/99  Pulse: 63  Resp: 16  Temp: 97.9 F (36.6 C)  TempSrc: Temporal  SpO2: 100%  Weight: 145 lb 12.8 oz (66.1 kg)   Physical Exam Constitutional:      General: He is not in acute distress. HENT:     Head: Normocephalic and atraumatic.  Eyes:     Pupils: Pupils are equal, round, and reactive to light.  Neck:     Musculoskeletal: Normal range of motion.  Cardiovascular:     Rate and Rhythm: Normal rate and regular rhythm.     Heart sounds: Normal heart sounds.  Pulmonary:     Effort: Pulmonary effort is normal.     Breath sounds: Normal breath sounds.  Abdominal:     General: Bowel sounds are normal.     Palpations: Abdomen is soft.  Skin:    General: Skin is warm and dry.  Neurological:     Mental Status: He is alert and oriented to person, place, and time.      CMP Latest Ref Rng & Units 02/18/2019  Glucose 70 - 99 mg/dL 84  BUN 6 - 20 mg/dL 19  Creatinine 0.61 - 1.24 mg/dL 1.45(H)  Sodium 135 - 145 mmol/L 138  Potassium 3.5 - 5.1 mmol/L 4.2  Chloride 98 - 111 mmol/L 104  CO2 22 - 32  mmol/L 27  Calcium 8.9 - 10.3 mg/dL 8.7(L)  Total Protein 6.5 - 8.1 g/dL 6.5  Total Bilirubin 0.3 - 1.2 mg/dL 0.5  Alkaline Phos 38 - 126 U/L 43  AST 15 - 41 U/L 21  ALT 0 - 44 U/L 17   CBC Latest Ref Rng & Units 02/18/2019  WBC 4.0 - 10.5 K/uL 3.8(L)  Hemoglobin 13.0 - 17.0 g/dL 13.1  Hematocrit 39.0 - 52.0 % 37.9(L)  Platelets 150 - 400 K/uL 146(L)     Assessment and plan- Patient is a 48 y.o. male with kappa light chain multiple myeloma status post RVD chemotherapy followed by autologous stem cell transplantation in August 2018 currently in remission and on maintenance Revlimid.    He is here for routine follow-up of following issues:  1.  Multiple myeloma in remission: Free light chain ratio is normal and no M protein detected on immunofixation or SPEP.  Continue Revlimid 5 mg daily maintenance dosing.  2.  Patient will continue to get monthly Xgeva as there is no definite data stopping 0 or increase the frequency in cases of multiple myeloma which are in remission.  He will therefore get Xgeva today in 1 month and I will see him back in 3 months  3.  Chemo-induced peripheral neuropathy: He is on gabapentin Lyrica and follows up with pain clinic as well.   Visit Diagnosis 1. High risk medication use   2. Multiple myeloma in remission ([place])   3. Long term (current) use of bisphosphonates      Dr. Randa Evens, MD, MPH [facility] at [facility] [phone] 02/19/2019 9:58 AM

## 2019-03-15 MED FILL — PREGABALIN 150 MG CAPS: 150 | 30 days supply | Qty: 90 | Fill #2

## 2019-03-15 MED FILL — oxyCODONE HCL 5 MG TABS: 5 | 30 days supply | Qty: 90 | Fill #0

## 2019-03-15 MED FILL — GABAPENTIN 600 MG TABLET: 600 | 30 days supply | Qty: 90 | Fill #0

## 2019-03-15 MED FILL — SERTRALINE HCL 25 MG TABLET: 25 | 30 days supply | Qty: 30 | Fill #0

## 2019-03-22 ENCOUNTER — Inpatient Hospital Stay: Payer: No Typology Code available for payment source

## 2019-03-22 ENCOUNTER — Encounter: Payer: Self-pay | Admitting: Oncology

## 2019-03-23 ENCOUNTER — Other Ambulatory Visit: Payer: Self-pay | Admitting: Oncology

## 2019-03-23 ENCOUNTER — Other Ambulatory Visit: Payer: Self-pay

## 2019-03-23 DIAGNOSIS — C9001 Multiple myeloma in remission: Secondary | ICD-10-CM

## 2019-03-23 MED ORDER — LENALIDOMIDE 5 MG PO CAPS: ORAL_CAPSULE | ORAL | 0 refills | Status: DC

## 2019-03-23 NOTE — Addendum Note (Signed)
Addended by: Luella Cook on: 03/23/2019 05:01 PM   Modules accepted: Orders

## 2019-03-26 ENCOUNTER — Other Ambulatory Visit: Payer: Self-pay

## 2019-03-26 ENCOUNTER — Inpatient Hospital Stay: Payer: No Typology Code available for payment source

## 2019-03-26 ENCOUNTER — Inpatient Hospital Stay: Payer: No Typology Code available for payment source | Attending: Oncology

## 2019-03-26 DIAGNOSIS — C9001 Multiple myeloma in remission: Secondary | ICD-10-CM

## 2019-03-26 DIAGNOSIS — G620 Drug-induced polyneuropathy: Secondary | ICD-10-CM | POA: Diagnosis not present

## 2019-03-26 DIAGNOSIS — Z9481 Bone marrow transplant status: Secondary | ICD-10-CM

## 2019-03-26 DIAGNOSIS — Z299 Encounter for prophylactic measures, unspecified: Secondary | ICD-10-CM

## 2019-03-26 DIAGNOSIS — T451X5A Adverse effect of antineoplastic and immunosuppressive drugs, initial encounter: Secondary | ICD-10-CM | POA: Insufficient documentation

## 2019-03-26 DIAGNOSIS — F329 Major depressive disorder, single episode, unspecified: Secondary | ICD-10-CM | POA: Insufficient documentation

## 2019-03-26 DIAGNOSIS — Z79899 Other long term (current) drug therapy: Secondary | ICD-10-CM | POA: Diagnosis not present

## 2019-03-26 DIAGNOSIS — R7989 Other specified abnormal findings of blood chemistry: Secondary | ICD-10-CM

## 2019-03-26 LAB — COMPREHENSIVE METABOLIC PANEL
ALT: 21 U/L (ref 0–44)
AST: 25 U/L (ref 15–41)
Albumin: 3.9 g/dL (ref 3.5–5.0)
Alkaline Phosphatase: 59 U/L (ref 38–126)
Anion gap: 5 (ref 5–15)
BUN: 25 mg/dL — ABNORMAL HIGH (ref 6–20)
CO2: 29 mmol/L (ref 22–32)
Calcium: 9.1 mg/dL (ref 8.9–10.3)
Chloride: 105 mmol/L (ref 98–111)
Creatinine, Ser: 1.46 mg/dL — ABNORMAL HIGH (ref 0.61–1.24)
GFR calc Af Amer: >60 mL/min (ref >60)
GFR calc non Af Amer: 56 mL/min — ABNORMAL LOW (ref >60)
Glucose, Bld: 99 mg/dL (ref 70–99)
Potassium: 4.1 mmol/L (ref 3.5–5.1)
Sodium: 139 mmol/L (ref 135–145)
Total Bilirubin: 0.5 mg/dL (ref 0.3–1.2)
Total Protein: 6.4 g/dL — ABNORMAL LOW (ref 6.5–8.1)

## 2019-03-26 LAB — CBC WITH DIFFERENTIAL/PLATELET
Abs Immature Granulocytes: 0.01 10*3/uL (ref 0.00–0.07)
Basophils Absolute: 0 10*3/uL (ref 0.0–0.1)
Basophils Relative: 1 %
Eosinophils Absolute: 0.1 10*3/uL (ref 0.0–0.5)
Eosinophils Relative: 3 %
HCT: 40.4 % (ref 39.0–52.0)
Hemoglobin: 13.7 g/dL (ref 13.0–17.0)
Immature Granulocytes: 0 %
Lymphocytes Relative: 15 %
Lymphs Abs: 0.7 10*3/uL (ref 0.7–4.0)
MCH: 33.2 pg (ref 26.0–34.0)
MCHC: 33.9 g/dL (ref 30.0–36.0)
MCV: 97.8 fL (ref 80.0–100.0)
Monocytes Absolute: 0.4 10*3/uL (ref 0.1–1.0)
Monocytes Relative: 9 %
Neutro Abs: 3.5 10*3/uL (ref 1.7–7.7)
Neutrophils Relative %: 72 %
Platelets: 156 10*3/uL (ref 150–400)
RBC: 4.13 MIL/uL — ABNORMAL LOW (ref 4.22–5.81)
RDW: 12.5 % (ref 11.5–15.5)
WBC: 4.8 10*3/uL (ref 4.0–10.5)
nRBC: 0 % (ref 0.0–0.2)

## 2019-03-26 MED ORDER — DENOSUMAB 120 MG/1.7ML ~~LOC~~ SOLN
120.0000 mg | Freq: Once | SUBCUTANEOUS | Status: AC
  Administered 2019-03-26: 120 mg via SUBCUTANEOUS
  Filled 2019-03-26: qty 1.7

## 2019-03-29 LAB — MULTIPLE MYELOMA PANEL, SERUM
Albumin SerPl Elph-Mcnc: 3.4 g/dL (ref 2.9–4.4)
Albumin/Glob SerPl: 1.6 (ref 0.7–1.7)
Alpha 1: 0.1 g/dL (ref 0.0–0.4)
Alpha2 Glob SerPl Elph-Mcnc: 0.6 g/dL (ref 0.4–1.0)
B-Globulin SerPl Elph-Mcnc: 0.9 g/dL (ref 0.7–1.3)
Gamma Glob SerPl Elph-Mcnc: 0.6 g/dL (ref 0.4–1.8)
Globulin, Total: 2.2 g/dL (ref 2.2–3.9)
IgA: 173 mg/dL (ref 90–386)
IgG (Immunoglobin G), Serum: 766 mg/dL (ref 603–1613)
IgM (Immunoglobulin M), Srm: 32 mg/dL (ref 20–172)
Total Protein ELP: 5.6 g/dL — ABNORMAL LOW (ref 6.0–8.5)

## 2019-03-29 LAB — KAPPA/LAMBDA LIGHT CHAINS
Kappa free light chain: 15.8 mg/L (ref 3.3–19.4)
Kappa, lambda light chain ratio: 1.32 (ref 0.26–1.65)
Lambda free light chains: 12 mg/L (ref 5.7–26.3)

## 2019-04-13 MED FILL — GABAPENTIN 600 MG TABLET: 600 | 30 days supply | Qty: 90 | Fill #1

## 2019-04-14 MED FILL — oxyCODONE HCL 5 MG TABS: 5 | 30 days supply | Qty: 90 | Fill #0

## 2019-04-19 ENCOUNTER — Encounter: Payer: Self-pay | Admitting: *Deleted

## 2019-04-20 ENCOUNTER — Other Ambulatory Visit: Payer: Self-pay

## 2019-04-20 ENCOUNTER — Inpatient Hospital Stay (HOSPITAL_BASED_OUTPATIENT_CLINIC_OR_DEPARTMENT_OTHER): Payer: No Typology Code available for payment source | Admitting: Oncology

## 2019-04-20 ENCOUNTER — Other Ambulatory Visit: Payer: Self-pay | Admitting: *Deleted

## 2019-04-20 ENCOUNTER — Inpatient Hospital Stay: Payer: No Typology Code available for payment source

## 2019-04-20 ENCOUNTER — Encounter: Payer: Self-pay | Admitting: Oncology

## 2019-04-20 VITALS — BP 134/89 | HR 81 | Temp 97.6°F | Wt 146.0 lb

## 2019-04-20 DIAGNOSIS — Z7983 Long term (current) use of bisphosphonates: Secondary | ICD-10-CM | POA: Diagnosis not present

## 2019-04-20 DIAGNOSIS — C9001 Multiple myeloma in remission: Secondary | ICD-10-CM | POA: Diagnosis not present

## 2019-04-20 DIAGNOSIS — Z79899 Other long term (current) drug therapy: Secondary | ICD-10-CM | POA: Diagnosis not present

## 2019-04-20 DIAGNOSIS — G620 Drug-induced polyneuropathy: Secondary | ICD-10-CM

## 2019-04-20 DIAGNOSIS — T451X5A Adverse effect of antineoplastic and immunosuppressive drugs, initial encounter: Secondary | ICD-10-CM

## 2019-04-20 LAB — COMPREHENSIVE METABOLIC PANEL
ALT: 19 U/L (ref 0–44)
AST: 26 U/L (ref 15–41)
Albumin: 3.9 g/dL (ref 3.5–5.0)
Alkaline Phosphatase: 57 U/L (ref 38–126)
Anion gap: 5 (ref 5–15)
BUN: 19 mg/dL (ref 6–20)
CO2: 32 mmol/L (ref 22–32)
Calcium: 8.7 mg/dL — ABNORMAL LOW (ref 8.9–10.3)
Chloride: 103 mmol/L (ref 98–111)
Creatinine, Ser: 1.48 mg/dL — ABNORMAL HIGH (ref 0.61–1.24)
GFR calc Af Amer: >60 mL/min (ref >60)
GFR calc non Af Amer: 55 mL/min — ABNORMAL LOW (ref >60)
Glucose, Bld: 87 mg/dL (ref 70–99)
Potassium: 4.1 mmol/L (ref 3.5–5.1)
Sodium: 140 mmol/L (ref 135–145)
Total Bilirubin: 0.6 mg/dL (ref 0.3–1.2)
Total Protein: 6.4 g/dL — ABNORMAL LOW (ref 6.5–8.1)

## 2019-04-20 LAB — CBC WITH DIFFERENTIAL/PLATELET
Abs Immature Granulocytes: 0.02 10*3/uL (ref 0.00–0.07)
Basophils Absolute: 0 10*3/uL (ref 0.0–0.1)
Basophils Relative: 0 %
Eosinophils Absolute: 0.1 10*3/uL (ref 0.0–0.5)
Eosinophils Relative: 2 %
HCT: 41 % (ref 39.0–52.0)
Hemoglobin: 13.5 g/dL (ref 13.0–17.0)
Immature Granulocytes: 0 %
Lymphocytes Relative: 13 %
Lymphs Abs: 0.6 10*3/uL — ABNORMAL LOW (ref 0.7–4.0)
MCH: 32.4 pg (ref 26.0–34.0)
MCHC: 32.9 g/dL (ref 30.0–36.0)
MCV: 98.3 fL (ref 80.0–100.0)
Monocytes Absolute: 0.4 10*3/uL (ref 0.1–1.0)
Monocytes Relative: 10 %
Neutro Abs: 3.4 10*3/uL (ref 1.7–7.7)
Neutrophils Relative %: 75 %
Platelets: 158 10*3/uL (ref 150–400)
RBC: 4.17 MIL/uL — ABNORMAL LOW (ref 4.22–5.81)
RDW: 13 % (ref 11.5–15.5)
WBC: 4.6 10*3/uL (ref 4.0–10.5)
nRBC: 0 % (ref 0.0–0.2)

## 2019-04-20 MED ORDER — VENLAFAXINE HCL ER 37.5 MG PO CP24: 37.5000 mg | ORAL_CAPSULE | Freq: Every day | ORAL | 0 refills | Status: DC

## 2019-04-20 NOTE — Progress Notes (Signed)
Patient stated that he has experienced some tingling and numbness on his bilateral hands and feet.

## 2019-04-21 LAB — MULTIPLE MYELOMA PANEL, SERUM
Albumin SerPl Elph-Mcnc: 3.7 g/dL (ref 2.9–4.4)
Albumin/Glob SerPl: 1.8 — ABNORMAL HIGH (ref 0.7–1.7)
Alpha 1: 0.1 g/dL (ref 0.0–0.4)
Alpha2 Glob SerPl Elph-Mcnc: 0.5 g/dL (ref 0.4–1.0)
B-Globulin SerPl Elph-Mcnc: 0.9 g/dL (ref 0.7–1.3)
Gamma Glob SerPl Elph-Mcnc: 0.6 g/dL (ref 0.4–1.8)
Globulin, Total: 2.1 g/dL — ABNORMAL LOW (ref 2.2–3.9)
IgA: 172 mg/dL (ref 90–386)
IgG (Immunoglobin G), Serum: 785 mg/dL (ref 603–1613)
IgM (Immunoglobulin M), Srm: 29 mg/dL (ref 20–172)
Total Protein ELP: 5.8 g/dL — ABNORMAL LOW (ref 6.0–8.5)

## 2019-04-21 LAB — KAPPA/LAMBDA LIGHT CHAINS
Kappa free light chain: 13.6 mg/L (ref 3.3–19.4)
Kappa, lambda light chain ratio: 1.28 (ref 0.26–1.65)
Lambda free light chains: 10.6 mg/L (ref 5.7–26.3)

## 2019-04-21 MED FILL — VENLAFAXINE HCL ER 37.5 MG: 37.5 | 30 days supply | Qty: 53 | Fill #0

## 2019-04-22 ENCOUNTER — Other Ambulatory Visit: Payer: Self-pay | Admitting: Oncology

## 2019-04-22 DIAGNOSIS — C9001 Multiple myeloma in remission: Secondary | ICD-10-CM

## 2019-04-25 NOTE — Progress Notes (Signed)
Hematology/Oncology Consult note [facility]  [phone] [phone]  Patient Care Team: Pleas Koch, NP as PCP - General (Internal Medicine) Vella Redhead, MD as PCP - Hematology/Oncology   Name of the patient: James House  [id number]  [birth year]   Date of visit: 04/25/19  Diagnosis- light chain kappa multiple myeloma s/p ASCT currently in remission. Patient on maintenance revlimid  Chief complaint/ Reason for visit- routine f/u of multiple myeloma on revlimid  Heme/Onc history: Patient is a 49 year old male with a history of kappa light chain myeloma that was treated by [name]. History is as follows:  1. Patient presented with renal insufficiency with a creatinine of 2.4 in April 2018. Kidney biopsy showed myeloma cast nephropathy. Kappa light chain was 3004 and lambda 0.22 with a kappa lambda ratio of 13,000 655. Skeletal survey showed multiple calvarial and marrow lesions. Bone marrow biopsy in May 2018 showed 43% plasma cells. He received CyBorDfor cycle 1 in May 2018 and was subsequently switched to RVD. He received 4 cycles followed by a repeat bone marrow biopsy in August 2018 which showed no increase in plasma cells.  2.He underwent autologous stem cell transplantation on 01/30/2017. He was started on Revlimid maintenance 10 mg daily in January 2019.  3.Labs from January February and March 2019 done at [facility] showed no M spike, normal kappa lambda ratio of 1.06. He was last seen by them in April 2019.  Following that patient was admitted to [facility] for healthcare associated pneumonia and was recently discharged. He wished to transfer his care to [place] at this time.  With regards to his myeloma patient is currently on 10 mg of Revlimid daily. He is also on acyclovir 400 mg twice daily which he is to continue for a minimum of 1 year post transplant. Patient has chronic back pain as well as chemo-induced  peripheral neuropathy for which he was seen by [name] in the past and is currently seeing [facility] pain clinic and is under pain contract with them   Interval history- Patient reports his neuropathic pain in his hands is getting worse. 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 Negative for chills, fever and weight loss.  HENT: Negative for congestion, ear discharge and nosebleeds.   Eyes: Negative for blurred vision.  Respiratory: Negative for cough, hemoptysis, sputum production, shortness of breath and wheezing.   Cardiovascular: Negative for chest pain, palpitations, orthopnea and claudication.  Gastrointestinal: Negative for abdominal pain, blood in stool, constipation, diarrhea, heartburn, melena, nausea and vomiting.  Genitourinary: Negative for dysuria, flank pain, frequency, hematuria and urgency.  Musculoskeletal: Negative for back pain, joint pain and myalgias.  Skin: Negative for rash.  Neurological: Positive for sensory change (peripheral neuropathy). Negative for dizziness, tingling, focal weakness, seizures, weakness and headaches.  Endo/Heme/Allergies: Does not bruise/bleed easily.  Psychiatric/Behavioral: Negative for depression and suicidal ideas. The patient does not have insomnia.       No Known Allergies   Past Medical History:  Diagnosis Date  . CKD (chronic kidney disease) stage 3, GFR 30-59 ml/min   . Hypertension   . Multiple myeloma ([place]) 08/11/2017  . Neuropathy   . Pneumonia      Past Surgical History:  Procedure Laterality Date  . ABDOMINAL SURGERY    . JOINT  REPLACEMENT     right knee  . KNEE SURGERY Left     Social History   Socioeconomic History  . Marital status: Married    Spouse name: [name]  . Number of children: 3  . Years of education: Not on file  .  Highest education level: Not on file  Occupational History  . Not on file  Tobacco Use  . Smoking status: Former Smoker    Quit date: 08/19/2013    Years since quitting: 5.6  . Smokeless tobacco: Current User    Types: Snuff  Substance and Sexual Activity  . Alcohol use: No  . Drug use: No  . Sexual activity: Yes  Other Topics Concern  . Not on file  Social History Narrative   Live in private residence with spouse and mother in law   Social Determinants of Health   Financial Resource Strain:   . Difficulty of Paying Living Expenses: Not on file  Food Insecurity:   . Worried About Charity fundraiser in the Last Year: Not on file  . Ran Out of Food in the Last Year: Not on file  Transportation Needs:   . Lack of Transportation (Medical): Not on file  . Lack of Transportation (Non-Medical): Not on file  Physical Activity:   . Days of Exercise per Week: Not on file  . Minutes of Exercise per Session: Not on file  Stress:   . Feeling of Stress : Not on file  Social Connections:   . Frequency of Communication with Friends and Family: Not on file  . Frequency of Social Gatherings with Friends and Family: Not on file  . Attends Religious Services: Not on file  . Active Member of Clubs or Organizations: Not on file  . Attends Archivist Meetings: Not on file  . Marital Status: Not on file  Intimate Partner Violence:   . Fear of Current or Ex-Partner: Not on file  . Emotionally Abused: Not on file  . Physically Abused: Not on file  . Sexually Abused: Not on file    Family History  Problem Relation Age of Onset  . Cancer Mother 25       Pancreatic  . COPD Mother   . Diabetes Mother   . Hyperlipidemia Mother   . Hypertension Mother   . Cancer Maternal Uncle        pancreatic  . Cancer Maternal Grandmother 61       colon  . COPD Maternal Grandmother   . Diabetes Maternal Grandmother   . Hypertension Maternal Grandmother      Current Outpatient  Medications:  .  aspirin EC 81 MG tablet, Take 81 mg by mouth daily., Disp: , Rfl:  .  baclofen (LIORESAL) 10 MG tablet, Take 1-2 tablets (10-20 mg total) by mouth 4 (four) times daily., Disp: 240 tablet, Rfl: 5 .  gabapentin (NEURONTIN) 600 MG tablet, Take 1 tablet (600 mg total) by mouth 3 (three) times daily., Disp: 90 tablet, Rfl: 5 .  oxyCODONE (OXY IR/ROXICODONE) 5 MG immediate release tablet, Take 1 tablet (5 mg total) by mouth every 8 (eight) hours as needed for severe pain. Must last 30 days, Disp: 90 tablet, Rfl: 0 .  sertraline (ZOLOFT) 25 MG tablet, Take 25 mg by mouth daily., Disp: , Rfl:  .  lenalidomide (REVLIMID) 5 MG capsule, TAKE 1 CAPSULE BY MOUTH DAILY, Disp: 28 capsule, Rfl: 0 .  venlafaxine XR (EFFEXOR-XR) 37.5 MG 24 hr capsule, Take 1 capsule (  37.5 mg total) by mouth daily with breakfast. For 7 days and if no side effects then increase to 2 tablets for the rest of month, Disp: 53 capsule, Rfl: 0  Physical exam:  Vitals:   04/20/19 1446  BP: 134/89  Pulse: 81  Temp: 97.6 F (36.4 C)  TempSrc: Tympanic  Weight: 146 lb (66.2 kg)   Physical Exam Constitutional:      General: He is not in acute distress. HENT:     Head: Normocephalic and atraumatic.  Eyes:     Pupils: Pupils are equal, round, and reactive to light.  Cardiovascular:     Rate and Rhythm: Normal rate and regular rhythm.     Heart sounds: Normal heart sounds.  Pulmonary:     Effort: Pulmonary effort is normal.     Breath sounds: Normal breath sounds.  Abdominal:     General: Bowel sounds are normal.     Palpations: Abdomen is soft.  Musculoskeletal:     Cervical back: Normal range of motion.  Skin:    General: Skin is warm and dry.  Neurological:     Mental Status: He is alert and oriented to person, place, and time.      CMP Latest Ref Rng & Units 04/20/2019  Glucose 70 - 99 mg/dL 87  BUN 6 - 20 mg/dL 19  Creatinine 0.61 - 1.24 mg/dL 1.48(H)  Sodium 135 - 145 mmol/L 140  Potassium 3.5 -  5.1 mmol/L 4.1  Chloride 98 - 111 mmol/L 103  CO2 22 - 32 mmol/L 32  Calcium 8.9 - 10.3 mg/dL 8.7(L)  Total Protein 6.5 - 8.1 g/dL 6.4(L)  Total Bilirubin 0.3 - 1.2 mg/dL 0.6  Alkaline Phos 38 - 126 U/L 57  AST 15 - 41 U/L 26  ALT 0 - 44 U/L 19   CBC Latest Ref Rng & Units 04/20/2019  WBC 4.0 - 10.5 K/uL 4.6  Hemoglobin 13.0 - 17.0 g/dL 13.5  Hematocrit 39.0 - 52.0 % 41.0  Platelets 150 - 400 K/uL 158      Assessment and plan- Patient is a 49 y.o. male with multiple myeloma in remission on maintenance revlimid here for following issues:  1. I have reviewed labs done today. Cbc/cmp remain stable. Myeloma labs show no recurrence. cotninue revlimid  2. He will also continue monthly xgeva  3. Chemo induced peripheral neuropathy from prior velcade.  A. He could not tolerate cymbalta. Will switch him to effexor from zoloft and see if it can help both depression and neuropathy  B. He is on gabapentin and there is room to go up. He will discuss that with Dr. Dossie Arbour  C. He is on oxycodone which could be potentially switched to methadone and can be discussed with Dr. Dossie Arbour as well. Methadone with NMDA receptor blocking activity may be more effective for neuropathic pain.   Labs and xgeva in 1 month Labs xgeva and see me in 2 months   Visit Diagnosis 1. High risk medication use   2. Chemotherapy-induced peripheral neuropathy ([place])   3. Multiple myeloma in remission [facility])      Dr. Randa Evens, MD, MPH [facility] at [facility] [phone] 04/25/2019 9:12 AM

## 2019-04-26 ENCOUNTER — Inpatient Hospital Stay: Payer: No Typology Code available for payment source | Attending: Oncology

## 2019-04-26 ENCOUNTER — Other Ambulatory Visit: Payer: Self-pay

## 2019-04-26 DIAGNOSIS — Z9481 Bone marrow transplant status: Secondary | ICD-10-CM

## 2019-04-26 DIAGNOSIS — C9001 Multiple myeloma in remission: Secondary | ICD-10-CM | POA: Insufficient documentation

## 2019-04-26 DIAGNOSIS — Z299 Encounter for prophylactic measures, unspecified: Secondary | ICD-10-CM

## 2019-04-26 DIAGNOSIS — R7989 Other specified abnormal findings of blood chemistry: Secondary | ICD-10-CM

## 2019-04-26 MED ORDER — DENOSUMAB 120 MG/1.7ML ~~LOC~~ SOLN
120.0000 mg | Freq: Once | SUBCUTANEOUS | Status: AC
  Administered 2019-04-26: 16:00:00 120 mg via SUBCUTANEOUS
  Filled 2019-04-26: qty 1.7

## 2019-05-03 ENCOUNTER — Encounter: Payer: Self-pay | Admitting: Oncology

## 2019-05-03 IMAGING — CT CT CERVICAL SPINE W/O CM
4 of 7 series · 13 of 33 positions shown, 14 images · non-contrast
Comparison: CT scan of May 12, 2015.

CLINICAL DATA: Facial laceration after fall.

EXAM:
CT HEAD WITHOUT CONTRAST
CT CERVICAL SPINE WITHOUT CONTRAST
TECHNIQUE: Multidetector CT imaging of the head and cervical spine was
performed following the standard protocol without intravenous
contrast. Multiplanar CT image reconstructions of the cervical spine
were also generated.

[Series 7: c spine soft · axial · 0.30mm/px · z∈[-303,-267]mm · 2 of 91 slices shown]
[im 19/91  soft-tissue]
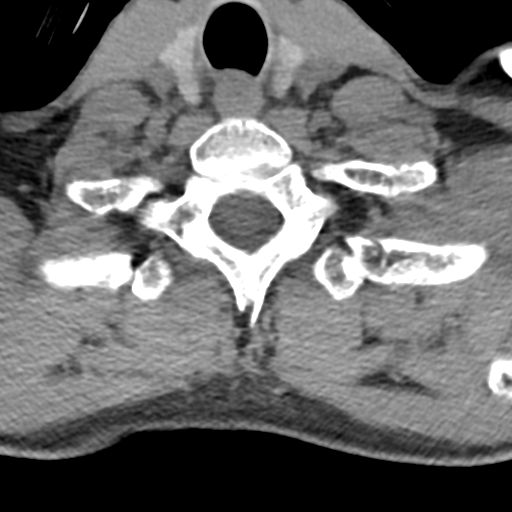
[im 37/91  soft-tissue]
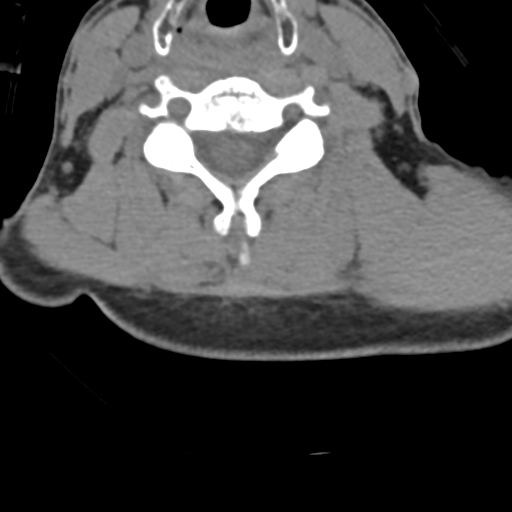

[Series 10: sagittal bone · sagittal · 0.24mm/px · 4 of 53 slices shown]
[im 11/53  bone]
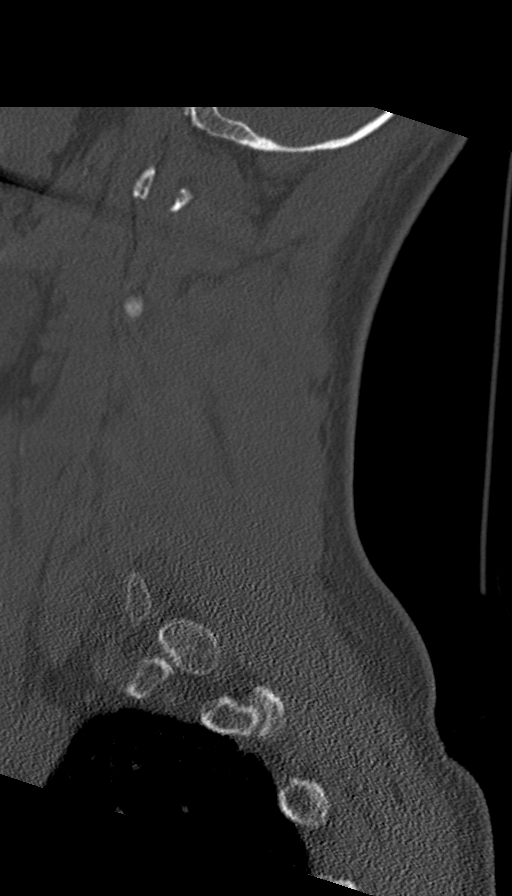
[im 21/53  bone]
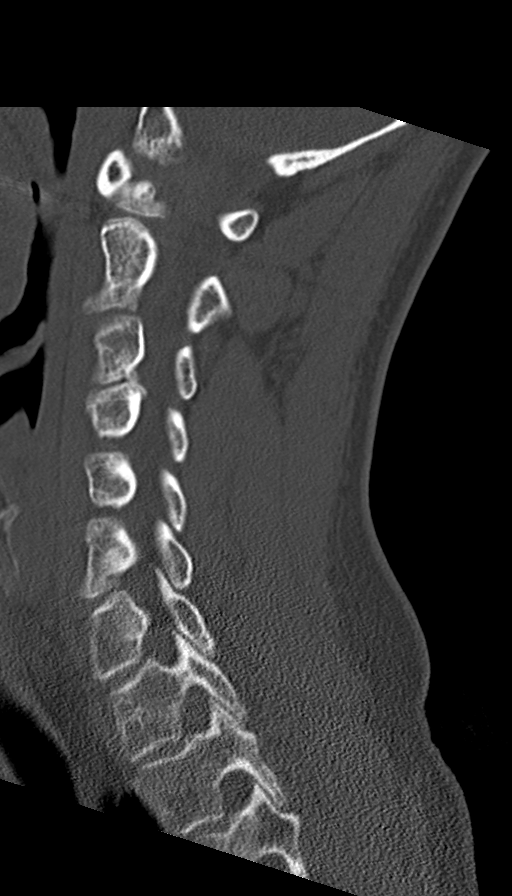
[im 32/53  bone]
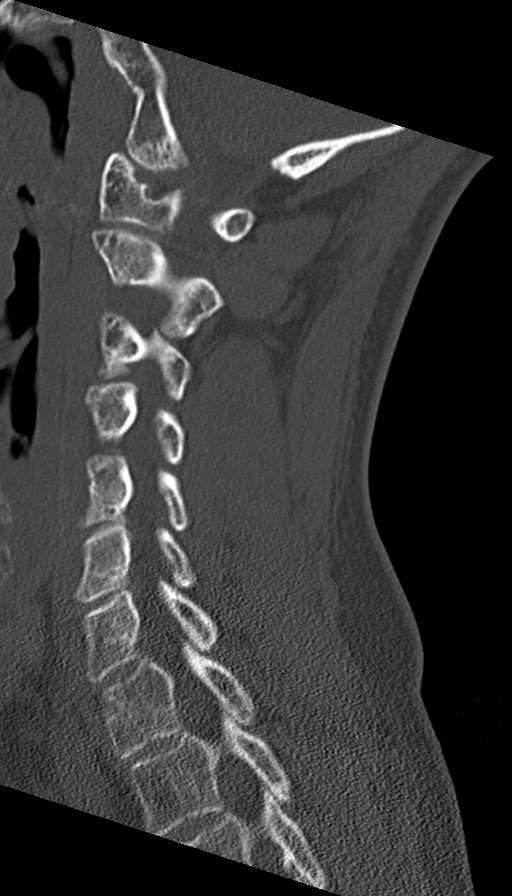
[im 42/53  bone]
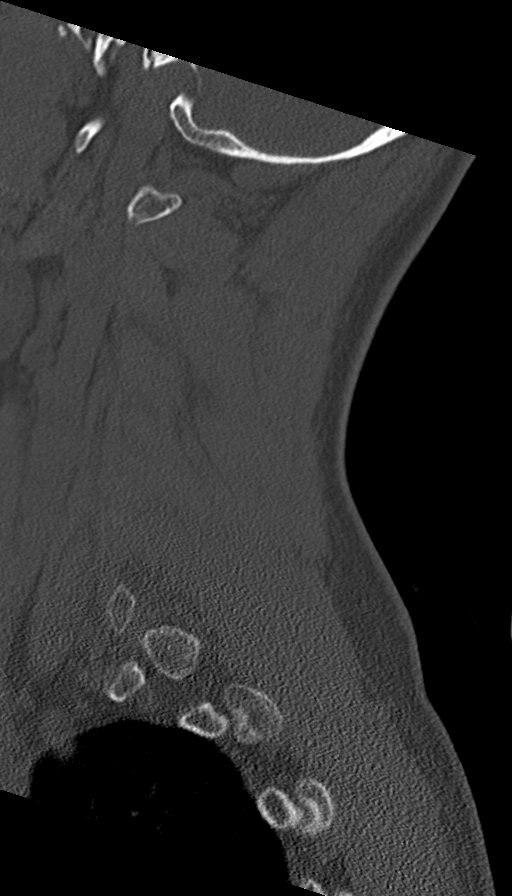

[Series 11: coronal bone · coronal · 0.25mm/px · 3 of 65 slices shown]
[im 17/65  bone]
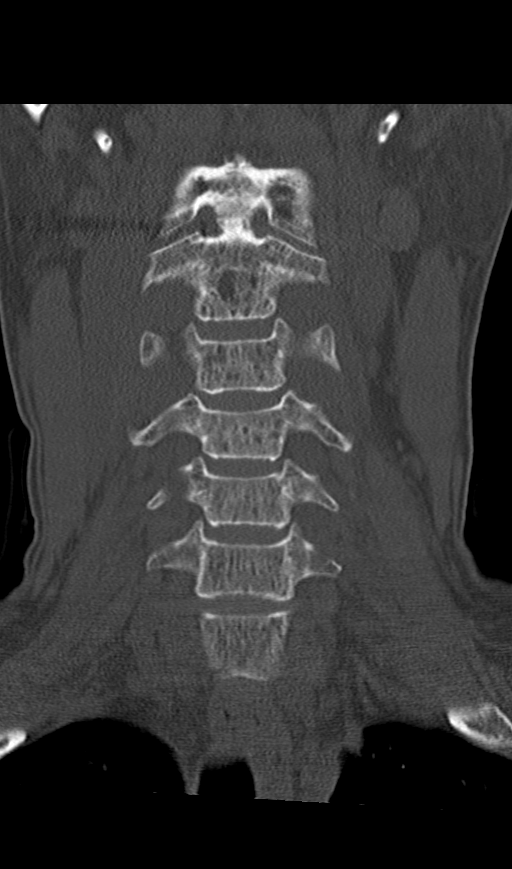
[im 33/65  bone]
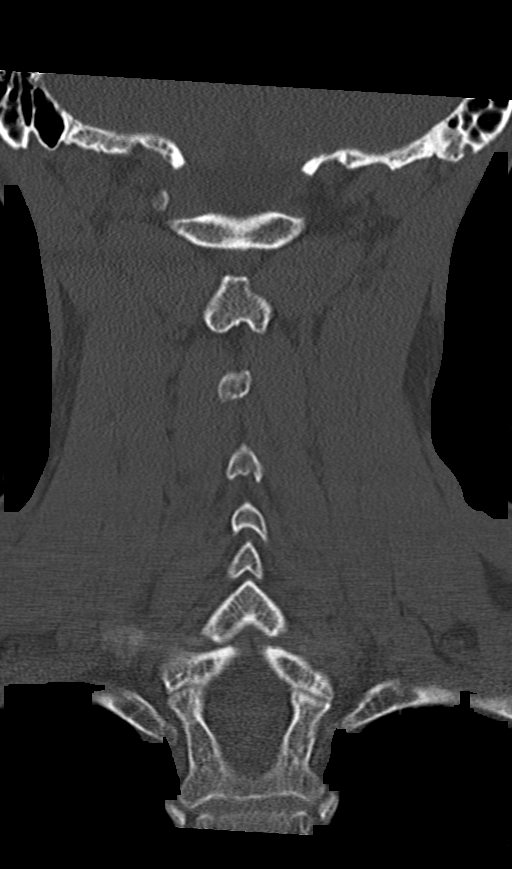
[im 49/65  bone]
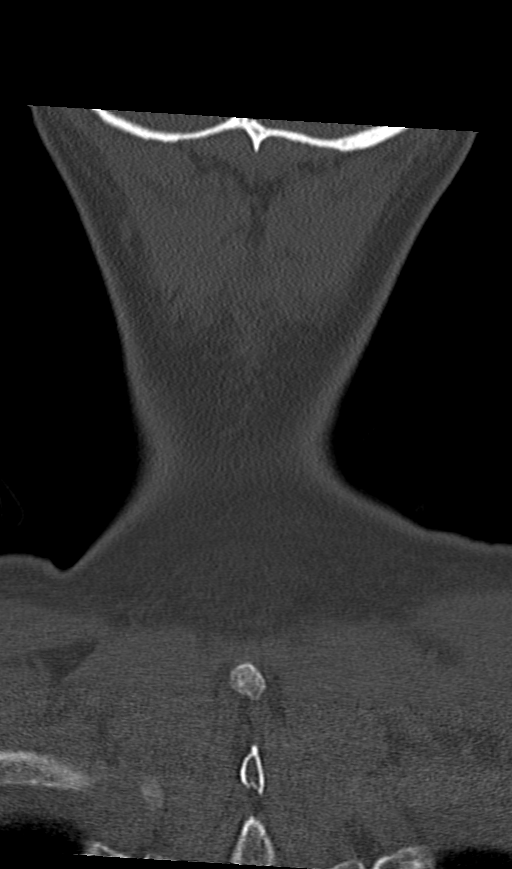

[Series 12: orthogonal bone · axial · 0.22mm/px · z∈[-322,-208]mm · 4 of 100 slices shown, 5 images]
[im 20/100  soft-tissue]
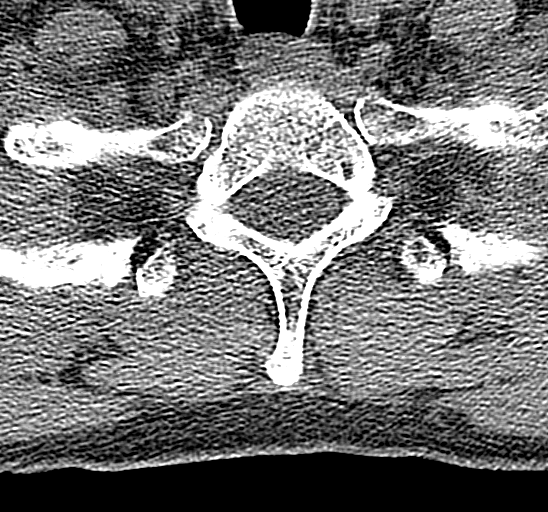
[im 20/100  bone]
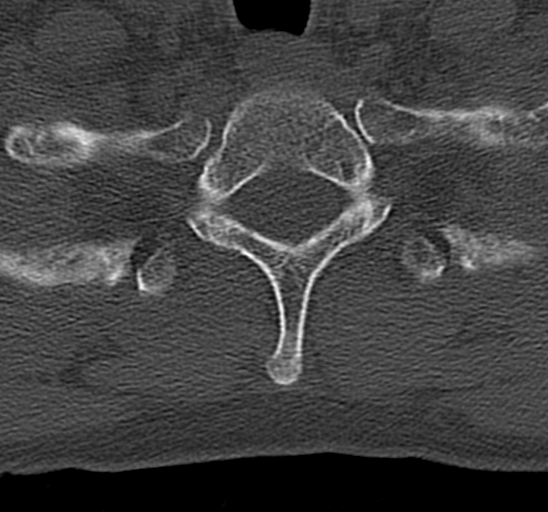
[im 40/100  bone]
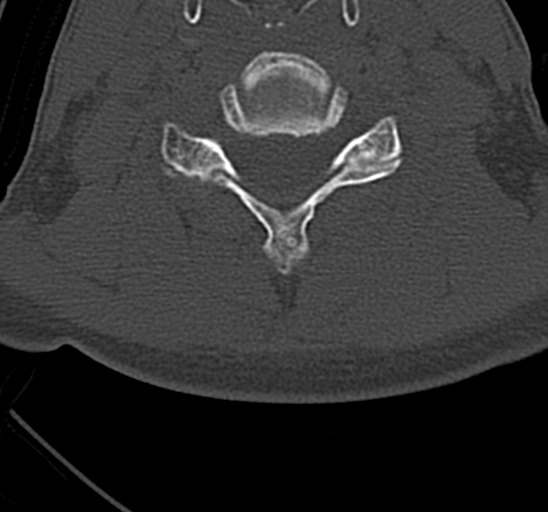
[im 60/100  bone]
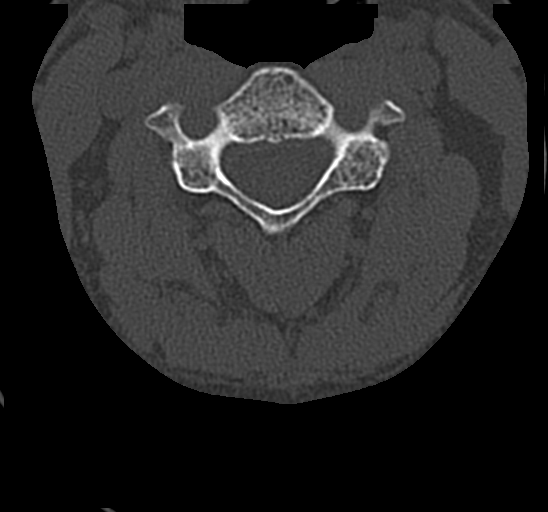
[im 80/100  bone]
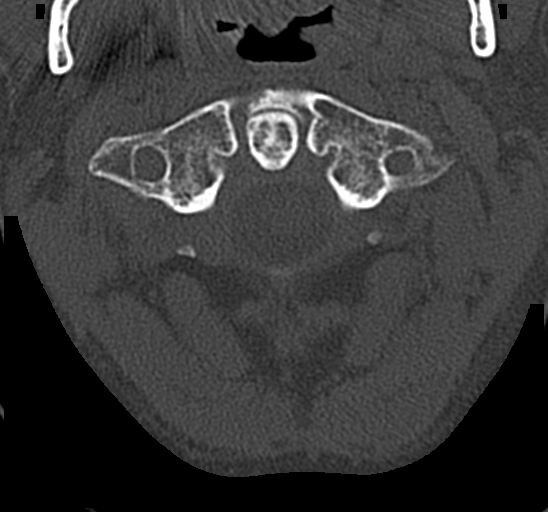

[13 of 33 positions shown; findings below may reference images not displayed]

FINDINGS: CT HEAD FINDINGS

Brain: No evidence of acute infarction, hemorrhage, hydrocephalus,
extra-axial collection or mass lesion/mass effect.

Vascular: No hyperdense vessel or unexpected calcification.

Skull: Normal. Negative for fracture or focal lesion.

Sinuses/Orbits: No acute finding.

Other: None.

CT CERVICAL SPINE FINDINGS

Alignment: Normal.

Skull base and vertebrae: No acute fracture. No primary bone lesion
or focal pathologic process.

Soft tissues and spinal canal: No prevertebral fluid or swelling. No
visible canal hematoma.

Disc levels:  Normal.

Upper chest: Negative.

Other: None.
IMPRESSION: Normal head CT.

Normal cervical spine.

## 2019-05-11 ENCOUNTER — Encounter: Payer: Self-pay | Admitting: Pain Medicine

## 2019-05-11 NOTE — Progress Notes (Signed)
Patient: James W Pridmore Jr.  Service Category: E/M  Provider:  A , MD  DOB: 07/19/1970  DOS: 05/12/2019  Location: Office  MRN: [id number]  Setting: Ambulatory outpatient  Referring Provider: Clark, Katherine K, NP  Type: Established Patient  Specialty: Interventional Pain Management  PCP: Clark, Katherine K, NP  Location: Remote location  Delivery: TeleHealth     Virtual Encounter - Pain Management PROVIDER NOTE: Information contained herein reflects review and annotations entered in association with encounter. Interpretation of such information and data should be left to medically-trained personnel. Information provided to patient can be located elsewhere in the medical record under "Patient Instructions". Document created using STT-dictation technology, any transcriptional errors that may result from process are unintentional.    Contact & Pharmacy Preferred: [phone] Home: [phone] (home) Mobile: [phone] (mobile) E-mail: [email or web address]  [facility] - [place], [place] - [address] [place] [address] Phone: [phone] Fax: [phone]  [facility] - [place], [place] - [address] [place] [address] Phone: [phone] Fax: [phone]   Pre-screening  James House offered "in-person" vs "virtual" encounter. He indicated preferring virtual for this encounter.   Reason COVID-19*  Social distancing based on CDC and AMA recommendations.   I contacted James W Clere Jr. on 05/12/2019 via telephone.      I clearly identified myself as  A , MD. I verified that I was speaking with the correct person using two identifiers (Name: James W Aversa Jr., and date of birth: 09/29/1970).  Consent I sought verbal advanced consent from James W Liller Jr. for virtual visit interactions. I informed James House of possible security and privacy concerns, risks, and limitations  associated with providing "not-in-person" medical evaluation and management services. I also informed James House of the availability of "in-person" appointments. Finally, I informed him that there would be a charge for the virtual visit and that he could be  personally, fully or partially, financially responsible for it. James House expressed understanding and agreed to proceed.   Historic Elements   Mr. James W Rothbauer Jr. is a 48 y.o. year old, male patient evaluated today after his last encounter by our practice on 02/10/2019. James House  has a past medical history of CKD (chronic kidney disease) stage 3, GFR 30-59 ml/min, Hypertension, Multiple myeloma (H[facility]) (08/11/2017), Neuropathy, and Pneumonia. He also  has a past surgical history that includes Joint replacement; Knee surgery (Left); and Abdominal surgery. James House has a current medication list which includes the following prescription(s): aspirin ec, baclofen, gabapentin, lenalidomide, oxycodone, [START ON 06/11/2019] oxycodone, [START ON 07/11/2019] oxycodone, sertraline, and venlafaxine xr. He  reports that he quit smoking about 5 years ago. His smokeless tobacco use includes snuff. He reports that he does not drink alcohol or use drugs. James House has No Known Allergies.   HPI  Today, he is being contacted for medication management. The patient indicates doing well with the current medication regimen. No adverse reactions or side effects reported to the medications.   Pharmacotherapy Assessment  Analgesic: Oxycodone IR 5 mg, 1 tab PO BID (10 mg/day of oxycodone) MME/day: 15 mg/day.   Monitoring: Pharmacotherapy: No side-effects or adverse reactions reported. [place] PMP: PDMP reviewed during this encounter.       Compliance: No problems identified. Effectiveness: Clinically acceptable. Plan: Refer to "POC".  UDS:  Summary  Date Value Ref Range Status  04/02/2018 FINAL  Final    Comment:     ====================================================================   TOXASSURE SELECT 13 (MW) ==================================================================== Test                             Result       Flag       Units Drug Present and Declared for Prescription Verification   Oxycodone                      138          EXPECTED   ng/mg creat   Oxymorphone                    52           EXPECTED   ng/mg creat   Noroxycodone                   135          EXPECTED   ng/mg creat    Sources of oxycodone include scheduled prescription medications.    Oxymorphone and noroxycodone are expected metabolites of    oxycodone. Oxymorphone is also available as a scheduled    prescription medication. ==================================================================== Test                      Result    Flag   Units      Ref Range   Creatinine              168              mg/dL      >=20 ==================================================================== Declared Medications:  The flagging and interpretation on this report are based on the  following declared medications.  Unexpected results may arise from  inaccuracies in the declared medications.  **Note: The testing scope of this panel includes these medications:  Oxycodone  **Note: The testing scope of this panel does not include following  reported medications:  Acetaminophen  Acyclovir  Amoxicillin (Augmentin)  Aspirin (Aspirin 81)  Baclofen  Clavulinate (Augmentin)  Gabapentin  Lenalidomide  Meloxicam  Pantoprazole  Prednisone  Pregabalin  Promethazine  Vitamin D2 (Ergocalciferol) ==================================================================== For clinical consultation, please call [phone]. ====================================================================    Laboratory Chemistry Profile (12 mo)  Renal: 04/20/2019: BUN 19; Creatinine, Ser 1.48  Lab Results  Component Value Date   GFRAA >60 04/20/2019    GFRNONAA 55 (L) 04/20/2019   Hepatic: 04/20/2019: Albumin 3.9 Lab Results  Component Value Date   AST 26 04/20/2019   ALT 19 04/20/2019   Other: No results found for requested labs within last 8760 hours. Note: Above Lab results reviewed.  Imaging  DG Bone Survey Met CLINICAL DATA:  Multiple myeloma, currently in remission.  EXAM: METASTATIC BONE SURVEY  COMPARISON:  CT scan 12/22/2017  FINDINGS: The lateral skull film demonstrates numerous small lytic myelomatous lesions.  A few small scattered lytic myelomatous lesions are suspected in the clavicles and proximal humeri bilaterally.  Do not see any obvious lesions involving the spine or ribs. Remote healed rib fractures are noted.  No definite lesions involving the bony pelvis or lower extremities.  IMPRESSION: 1. Suspect small lytic myelomatous lesions involving the skull, clavicles and bilateral proximal humeri. 2. No other definite lesions are identified.  Electronically Signed   By: Marijo Sanes M.D.   On: 10/22/2018 15:51   Assessment  The primary encounter diagnosis was Chronic low back pain (Primary Area of Pain) (Bilateral) (L>R) w/o sciatica.  Diagnoses of Chronic pain syndrome, Chronic lower extremity pain (Secondary Area of Pain) (Bilateral) (L>R), Chronic knee pain (Tertiary Area of Pain) (Bilateral) (L>R), Lumbar facet syndrome (Bilateral) (L>R), Spasm of back muscles, and Chronic sacroiliac joint pain (Bilateral) (R>L) were also pertinent to this visit.  Plan of Care  Problem-specific:  No problem-specific Assessment & Plan notes found for this encounter.  I am having James W. Carnell Jr. start on oxyCODONE and oxyCODONE. I am also having him maintain his aspirin EC, sertraline, baclofen, gabapentin, venlafaxine XR, lenalidomide, and oxyCODONE.  Pharmacotherapy (Medications Ordered): Meds ordered this encounter  Medications  . oxyCODONE (OXY IR/ROXICODONE) 5 MG immediate release tablet    Sig: Take 1  tablet (5 mg total) by mouth every 8 (eight) hours as needed for severe pain. Must last 30 days    Dispense:  90 tablet    Refill:  0    Chronic Pain: STOP Act (Not applicable) Fill 1 day early if closed on refill date. Do not fill until: 05/12/2019. To last until: 06/11/2019. Avoid benzodiazepines within 8 hours of opioids  . oxyCODONE (OXY IR/ROXICODONE) 5 MG immediate release tablet    Sig: Take 1 tablet (5 mg total) by mouth every 8 (eight) hours as needed for severe pain. Must last 30 days    Dispense:  90 tablet    Refill:  0    Chronic Pain: STOP Act (Not applicable) Fill 1 day early if closed on refill date. Do not fill until: 06/11/2019. To last until: 07/11/2019. Avoid benzodiazepines within 8 hours of opioids  . oxyCODONE (OXY IR/ROXICODONE) 5 MG immediate release tablet    Sig: Take 1 tablet (5 mg total) by mouth every 8 (eight) hours as needed for severe pain. Must last 30 days    Dispense:  90 tablet    Refill:  0    Chronic Pain: STOP Act (Not applicable) Fill 1 day early if closed on refill date. Do not fill until: 07/11/2019. To last until: 08/10/2019. Avoid benzodiazepines within 8 hours of opioids   Orders:  No orders of the defined types were placed in this encounter.  Follow-up plan:   Return in about 3 months (around 08/09/2019) for (VV), (MM).      Interventional management options:  Considering:   DiagnosticbilateralLESI Possible bilateral lumbar facet RFA Diagnostic bilateral sacroiliac joint block Possible bilateral sacroiliac joint RFA Diagnostic right knee Hyalgan series Diagnosticright knee intra-articular knee injection Diagnostic left knee genicular nerve block   Palliative PRN treatment(s):   Palliative bilateral IA hip joint injection #2 (50/50/50/50) Palliative bilateral lumbar facet block #3 (100/100/0) (60/60/60) Palliative left piriformis muscle injection #2 (100/100/0)     Recent Visits No visits were found meeting these conditions.   Showing recent visits within past 90 days and meeting all other requirements   Today's Visits Date Type Provider Dept  05/12/19 Telemedicine , , MD [facility]  Showing today's visits and meeting all other requirements   Future Appointments No visits were found meeting these conditions.  Showing future appointments within next 90 days and meeting all other requirements   I discussed the assessment and treatment plan with the patient. The patient was provided an opportunity to ask questions and all were answered. The patient agreed with the plan and demonstrated an understanding of the instructions.  Patient advised to call back or seek an in-person evaluation if the symptoms or condition worsens.  Duration of encounter: 12 minutes.  Note by:  A , MD Date:   05/12/2019; Time: 9:14 AM 

## 2019-05-12 ENCOUNTER — Other Ambulatory Visit: Payer: Self-pay

## 2019-05-12 ENCOUNTER — Ambulatory Visit: Payer: No Typology Code available for payment source | Attending: Pain Medicine | Admitting: Pain Medicine

## 2019-05-12 DIAGNOSIS — G894 Chronic pain syndrome: Secondary | ICD-10-CM | POA: Diagnosis not present

## 2019-05-12 DIAGNOSIS — M62830 Muscle spasm of back: Secondary | ICD-10-CM

## 2019-05-12 DIAGNOSIS — M79605 Pain in left leg: Secondary | ICD-10-CM | POA: Diagnosis not present

## 2019-05-12 DIAGNOSIS — G8929 Other chronic pain: Secondary | ICD-10-CM

## 2019-05-12 DIAGNOSIS — M25562 Pain in left knee: Secondary | ICD-10-CM

## 2019-05-12 DIAGNOSIS — M25561 Pain in right knee: Secondary | ICD-10-CM

## 2019-05-12 DIAGNOSIS — M47896 Other spondylosis, lumbar region: Secondary | ICD-10-CM

## 2019-05-12 DIAGNOSIS — M47816 Spondylosis without myelopathy or radiculopathy, lumbar region: Secondary | ICD-10-CM

## 2019-05-12 DIAGNOSIS — M79604 Pain in right leg: Secondary | ICD-10-CM | POA: Diagnosis not present

## 2019-05-12 DIAGNOSIS — M533 Sacrococcygeal disorders, not elsewhere classified: Secondary | ICD-10-CM

## 2019-05-12 DIAGNOSIS — M545 Low back pain: Secondary | ICD-10-CM | POA: Diagnosis not present

## 2019-05-12 MED ORDER — OXYCODONE HCL 5 MG PO TABS: 5.0000 mg | ORAL_TABLET | Freq: Three times a day (TID) | ORAL | 0 refills | Status: DC | PRN

## 2019-05-13 ENCOUNTER — Other Ambulatory Visit: Payer: Self-pay | Admitting: *Deleted

## 2019-05-13 DIAGNOSIS — C9001 Multiple myeloma in remission: Secondary | ICD-10-CM

## 2019-05-14 MED ORDER — LENALIDOMIDE 5 MG PO CAPS: ORAL_CAPSULE | ORAL | 0 refills | Status: DC

## 2019-05-14 MED FILL — GABAPENTIN 600 MG TABLET: 600 | 30 days supply | Qty: 90 | Fill #2

## 2019-05-14 MED FILL — oxyCODONE HCL 5 MG TABS: 5 | 30 days supply | Qty: 90 | Fill #0

## 2019-05-14 MED FILL — BACLOFEN 10 MG TABS: 10 | 30 days supply | Qty: 240 | Fill #1

## 2019-05-20 MED FILL — SERTRALINE HCL 25 MG TABLET: 25 | 30 days supply | Qty: 30 | Fill #1

## 2019-05-21 ENCOUNTER — Other Ambulatory Visit: Payer: Self-pay

## 2019-05-24 ENCOUNTER — Inpatient Hospital Stay: Payer: No Typology Code available for payment source

## 2019-05-25 ENCOUNTER — Other Ambulatory Visit: Payer: Self-pay

## 2019-05-26 ENCOUNTER — Other Ambulatory Visit: Payer: Self-pay

## 2019-05-26 ENCOUNTER — Inpatient Hospital Stay: Payer: No Typology Code available for payment source | Attending: Oncology

## 2019-05-26 ENCOUNTER — Inpatient Hospital Stay: Payer: No Typology Code available for payment source

## 2019-05-26 DIAGNOSIS — C9001 Multiple myeloma in remission: Secondary | ICD-10-CM

## 2019-05-26 DIAGNOSIS — Z299 Encounter for prophylactic measures, unspecified: Secondary | ICD-10-CM

## 2019-05-26 DIAGNOSIS — C9000 Multiple myeloma not having achieved remission: Secondary | ICD-10-CM | POA: Diagnosis present

## 2019-05-26 DIAGNOSIS — Z9481 Bone marrow transplant status: Secondary | ICD-10-CM

## 2019-05-26 DIAGNOSIS — R7989 Other specified abnormal findings of blood chemistry: Secondary | ICD-10-CM

## 2019-05-26 LAB — CBC WITH DIFFERENTIAL/PLATELET
Abs Immature Granulocytes: 0.02 10*3/uL (ref 0.00–0.07)
Basophils Absolute: 0 10*3/uL (ref 0.0–0.1)
Basophils Relative: 1 %
Eosinophils Absolute: 0.1 10*3/uL (ref 0.0–0.5)
Eosinophils Relative: 1 %
HCT: 42.5 % (ref 39.0–52.0)
Hemoglobin: 14.5 g/dL (ref 13.0–17.0)
Immature Granulocytes: 0 %
Lymphocytes Relative: 15 %
Lymphs Abs: 0.8 10*3/uL (ref 0.7–4.0)
MCH: 33 pg (ref 26.0–34.0)
MCHC: 34.1 g/dL (ref 30.0–36.0)
MCV: 96.8 fL (ref 80.0–100.0)
Monocytes Absolute: 0.4 10*3/uL (ref 0.1–1.0)
Monocytes Relative: 8 %
Neutro Abs: 4.1 10*3/uL (ref 1.7–7.7)
Neutrophils Relative %: 75 %
Platelets: 175 10*3/uL (ref 150–400)
RBC: 4.39 MIL/uL (ref 4.22–5.81)
RDW: 12.8 % (ref 11.5–15.5)
WBC: 5.5 10*3/uL (ref 4.0–10.5)
nRBC: 0 % (ref 0.0–0.2)

## 2019-05-26 LAB — COMPREHENSIVE METABOLIC PANEL
ALT: 19 U/L (ref 0–44)
AST: 25 U/L (ref 15–41)
Albumin: 4.5 g/dL (ref 3.5–5.0)
Alkaline Phosphatase: 53 U/L (ref 38–126)
Anion gap: 7 (ref 5–15)
BUN: 16 mg/dL (ref 6–20)
CO2: 27 mmol/L (ref 22–32)
Calcium: 8.7 mg/dL — ABNORMAL LOW (ref 8.9–10.3)
Chloride: 102 mmol/L (ref 98–111)
Creatinine, Ser: 1.46 mg/dL — ABNORMAL HIGH (ref 0.61–1.24)
GFR calc Af Amer: >60 mL/min (ref >60)
GFR calc non Af Amer: 56 mL/min — ABNORMAL LOW (ref >60)
Glucose, Bld: 99 mg/dL (ref 70–99)
Potassium: 3.9 mmol/L (ref 3.5–5.1)
Sodium: 136 mmol/L (ref 135–145)
Total Bilirubin: 0.8 mg/dL (ref 0.3–1.2)
Total Protein: 6.7 g/dL (ref 6.5–8.1)

## 2019-05-26 MED ORDER — DENOSUMAB 120 MG/1.7ML ~~LOC~~ SOLN
120.0000 mg | Freq: Once | SUBCUTANEOUS | Status: AC
  Administered 2019-05-26: 16:00:00 120 mg via SUBCUTANEOUS
  Filled 2019-05-26: qty 1.7

## 2019-05-27 LAB — KAPPA/LAMBDA LIGHT CHAINS
Kappa free light chain: 13.2 mg/L (ref 3.3–19.4)
Kappa, lambda light chain ratio: 1.11 (ref 0.26–1.65)
Lambda free light chains: 11.9 mg/L (ref 5.7–26.3)

## 2019-05-28 LAB — MULTIPLE MYELOMA PANEL, SERUM
Albumin SerPl Elph-Mcnc: 3.7 g/dL (ref 2.9–4.4)
Albumin/Glob SerPl: 1.5 (ref 0.7–1.7)
Alpha 1: 0.2 g/dL (ref 0.0–0.4)
Alpha2 Glob SerPl Elph-Mcnc: 0.6 g/dL (ref 0.4–1.0)
B-Globulin SerPl Elph-Mcnc: 1 g/dL (ref 0.7–1.3)
Gamma Glob SerPl Elph-Mcnc: 0.8 g/dL (ref 0.4–1.8)
Globulin, Total: 2.6 g/dL (ref 2.2–3.9)
IgA: 178 mg/dL (ref 90–386)
IgG (Immunoglobin G), Serum: 796 mg/dL (ref 603–1613)
IgM (Immunoglobulin M), Srm: 34 mg/dL (ref 20–172)
Total Protein ELP: 6.3 g/dL (ref 6.0–8.5)

## 2019-06-01 ENCOUNTER — Other Ambulatory Visit: Payer: Self-pay | Admitting: Oncology

## 2019-06-01 DIAGNOSIS — C9001 Multiple myeloma in remission: Secondary | ICD-10-CM

## 2019-06-03 ENCOUNTER — Other Ambulatory Visit: Payer: Self-pay | Admitting: Oncology

## 2019-06-03 MED FILL — VENLAFAXINE HCL ER 37.5 MG: 37.5 | 26 days supply | Qty: 53 | Fill #0

## 2019-06-15 ENCOUNTER — Other Ambulatory Visit: Payer: Self-pay | Admitting: *Deleted

## 2019-06-15 DIAGNOSIS — C9001 Multiple myeloma in remission: Secondary | ICD-10-CM

## 2019-06-15 MED ORDER — LENALIDOMIDE 5 MG PO CAPS: ORAL_CAPSULE | ORAL | 0 refills | Status: DC

## 2019-06-15 MED FILL — GABAPENTIN 600 MG TABLET: 600 | 30 days supply | Qty: 90 | Fill #3

## 2019-06-15 MED FILL — oxyCODONE HCL 5 MG TABS: 5 | 30 days supply | Qty: 90 | Fill #0

## 2019-06-18 ENCOUNTER — Encounter: Payer: Self-pay | Admitting: *Deleted

## 2019-06-18 ENCOUNTER — Other Ambulatory Visit: Payer: Self-pay | Admitting: Oncology

## 2019-06-18 ENCOUNTER — Encounter: Payer: Self-pay | Admitting: Oncology

## 2019-06-18 MED FILL — SERTRALINE HCL 25 MG TABLET: 25 | 30 days supply | Qty: 30 | Fill #0

## 2019-06-21 ENCOUNTER — Inpatient Hospital Stay: Payer: No Typology Code available for payment source

## 2019-06-21 ENCOUNTER — Other Ambulatory Visit: Payer: Self-pay | Admitting: *Deleted

## 2019-06-21 ENCOUNTER — Inpatient Hospital Stay: Payer: No Typology Code available for payment source | Admitting: Oncology

## 2019-06-21 DIAGNOSIS — C9001 Multiple myeloma in remission: Secondary | ICD-10-CM

## 2019-06-22 ENCOUNTER — Other Ambulatory Visit: Payer: Self-pay

## 2019-06-22 ENCOUNTER — Inpatient Hospital Stay: Payer: No Typology Code available for payment source | Attending: Oncology

## 2019-06-22 ENCOUNTER — Inpatient Hospital Stay (HOSPITAL_BASED_OUTPATIENT_CLINIC_OR_DEPARTMENT_OTHER): Payer: No Typology Code available for payment source | Admitting: Oncology

## 2019-06-22 ENCOUNTER — Encounter: Payer: Self-pay | Admitting: Oncology

## 2019-06-22 ENCOUNTER — Inpatient Hospital Stay: Payer: No Typology Code available for payment source

## 2019-06-22 VITALS — BP 156/111 | HR 79 | Temp 97.8°F | Resp 16 | Wt 143.8 lb

## 2019-06-22 DIAGNOSIS — C9001 Multiple myeloma in remission: Secondary | ICD-10-CM

## 2019-06-22 DIAGNOSIS — Z79899 Other long term (current) drug therapy: Secondary | ICD-10-CM

## 2019-06-22 DIAGNOSIS — Z7983 Long term (current) use of bisphosphonates: Secondary | ICD-10-CM | POA: Diagnosis not present

## 2019-06-22 DIAGNOSIS — Z9481 Bone marrow transplant status: Secondary | ICD-10-CM

## 2019-06-22 DIAGNOSIS — R7989 Other specified abnormal findings of blood chemistry: Secondary | ICD-10-CM

## 2019-06-22 DIAGNOSIS — Z299 Encounter for prophylactic measures, unspecified: Secondary | ICD-10-CM

## 2019-06-22 LAB — COMPREHENSIVE METABOLIC PANEL
ALT: 14 U/L (ref 0–44)
AST: 19 U/L (ref 15–41)
Albumin: 3.8 g/dL (ref 3.5–5.0)
Alkaline Phosphatase: 60 U/L (ref 38–126)
Anion gap: 12 (ref 5–15)
BUN: 20 mg/dL (ref 6–20)
CO2: 23 mmol/L (ref 22–32)
Calcium: 9 mg/dL (ref 8.9–10.3)
Chloride: 103 mmol/L (ref 98–111)
Creatinine, Ser: 1.47 mg/dL — ABNORMAL HIGH (ref 0.61–1.24)
GFR calc Af Amer: >60 mL/min (ref >60)
GFR calc non Af Amer: 56 mL/min — ABNORMAL LOW (ref >60)
Glucose, Bld: 112 mg/dL — ABNORMAL HIGH (ref 70–99)
Potassium: 3.6 mmol/L (ref 3.5–5.1)
Sodium: 138 mmol/L (ref 135–145)
Total Bilirubin: 0.5 mg/dL (ref 0.3–1.2)
Total Protein: 6.5 g/dL (ref 6.5–8.1)

## 2019-06-22 LAB — CBC WITH DIFFERENTIAL/PLATELET
Abs Immature Granulocytes: 0.01 10*3/uL (ref 0.00–0.07)
Basophils Absolute: 0 10*3/uL (ref 0.0–0.1)
Basophils Relative: 1 %
Eosinophils Absolute: 0.2 10*3/uL (ref 0.0–0.5)
Eosinophils Relative: 3 %
HCT: 40.3 % (ref 39.0–52.0)
Hemoglobin: 13.8 g/dL (ref 13.0–17.0)
Immature Granulocytes: 0 %
Lymphocytes Relative: 17 %
Lymphs Abs: 0.9 10*3/uL (ref 0.7–4.0)
MCH: 33.2 pg (ref 26.0–34.0)
MCHC: 34.2 g/dL (ref 30.0–36.0)
MCV: 96.9 fL (ref 80.0–100.0)
Monocytes Absolute: 0.4 10*3/uL (ref 0.1–1.0)
Monocytes Relative: 8 %
Neutro Abs: 3.7 10*3/uL (ref 1.7–7.7)
Neutrophils Relative %: 71 %
Platelets: 151 10*3/uL (ref 150–400)
RBC: 4.16 MIL/uL — ABNORMAL LOW (ref 4.22–5.81)
RDW: 12.9 % (ref 11.5–15.5)
WBC: 5.3 10*3/uL (ref 4.0–10.5)
nRBC: 0 % (ref 0.0–0.2)

## 2019-06-22 MED ORDER — DENOSUMAB 120 MG/1.7ML ~~LOC~~ SOLN
120.0000 mg | Freq: Once | SUBCUTANEOUS | Status: AC
  Administered 2019-06-22: 120 mg via SUBCUTANEOUS
  Filled 2019-06-22: qty 1.7

## 2019-06-22 NOTE — Progress Notes (Signed)
Her b/p is up today and he hs in more pain today.

## 2019-06-23 ENCOUNTER — Telehealth: Payer: Self-pay | Admitting: *Deleted

## 2019-06-23 LAB — KAPPA/LAMBDA LIGHT CHAINS
Kappa free light chain: 15.4 mg/L (ref 3.3–19.4)
Kappa, lambda light chain ratio: 1.33 (ref 0.26–1.65)
Lambda free light chains: 11.6 mg/L (ref 5.7–26.3)

## 2019-06-23 NOTE — Telephone Encounter (Signed)
I was given a letter that pt needs to call into [place] [facility] to get revlimid filled. I called pt and he said that someone already called him to tell him this. He will call when he gets off work

## 2019-06-24 NOTE — Progress Notes (Signed)
Hematology/Oncology Consult note [facility]  [phone] [phone]  Patient Care Team: Pleas Koch, NP as PCP - General (Internal Medicine) Vella Redhead, MD as PCP - Hematology/Oncology Sindy Guadeloupe, MD as Consulting Physician (Hematology and Oncology)   Name of the patient: James House  [id number]  [birth year]   Date of visit: 06/24/19  Diagnosis- light chain kappa multiple myeloma s/p ASCT currently in remission. Patient on maintenance revlimid   Chief complaint/ Reason for visit-routine follow-up of multiple myeloma on maintenance Revlimid  Heme/Onc history: Patient is a 49 year old male with a history of kappa light chain myeloma that was treated by [name]. History is as follows:  1. Patient presented with renal insufficiency with a creatinine of 2.4 in April 2018. Kidney biopsy showed myeloma cast nephropathy. Kappa light chain was 3004 and lambda 0.22 with a kappa lambda ratio of 13,000 655. Skeletal survey showed multiple calvarial and marrow lesions. Bone marrow biopsy in May 2018 showed 43% plasma cells. He received CyBorDfor cycle 1 in May 2018 and was subsequently switched to RVD. He received 4 cycles followed by a repeat bone marrow biopsy in August 2018 which showed no increase in plasma cells.  2.He underwent autologous stem cell transplantation on 01/30/2017. He was started on Revlimid maintenance 10 mg daily in January 2019.  3.Labs from January February and March 2019 done at [facility] showed no M spike, normal kappa lambda ratio of 1.06. He was last seen by them in April 2019.  Following that patient was admitted to [facility] for healthcare associated pneumonia and was recently discharged. He wished to transfer his care to [place] at this time.  With regards to his myeloma patient is currently on 10 mg of Revlimid daily. He is also on acyclovir 400 mg twice daily which he is to continue for a  minimum of 1 year post transplant. Patient has chronic back pain as well as chemo-induced peripheral neuropathy for which he was seen by [name] in the past and is currently seeing [facility] pain clinic and is under pain contract with them   Interval history-patient continues to be bothered by neuropathic pain in his hands and feet but he still continues to work.  Reports chronic fatigue  ECOG PS- 1 Pain scale- 3 Opioid associated constipation- no  Review of systems- Review of Systems  Constitutional: Positive for malaise/fatigue. Negative for chills, fever and weight loss.  HENT: Negative for congestion, ear discharge and nosebleeds.   Eyes: Negative for blurred vision.  Respiratory: Negative for cough, hemoptysis, sputum production, shortness of breath and wheezing.   Cardiovascular: Negative for chest pain, palpitations, orthopnea and claudication.  Gastrointestinal: Negative for abdominal pain, blood in stool, constipation, diarrhea, heartburn, melena, nausea and vomiting.  Genitourinary: Negative for dysuria, flank pain, frequency, hematuria and urgency.  Musculoskeletal: Negative for back pain, joint pain and myalgias.  Skin: Negative for rash.  Neurological: Positive for sensory change (Peripheral neuropathy). Negative for dizziness, tingling, focal weakness, seizures, weakness and headaches.  Endo/Heme/Allergies: Does not bruise/bleed easily.  Psychiatric/Behavioral: Negative for depression and suicidal ideas. The patient does not have insomnia.        No Known Allergies   Past Medical History:  Diagnosis Date  . CKD (chronic kidney disease) stage 3, GFR 30-59 ml/min   . Hypertension   . Multiple myeloma ([place]) 08/11/2017  . Neuropathy   . Pneumonia      Past Surgical History:  Procedure Laterality Date  .  ABDOMINAL SURGERY    . JOINT REPLACEMENT     right knee  . KNEE SURGERY Left     Social History   Socioeconomic History  . Marital status: Married     Spouse name: [name]  . Number of children: 3  . Years of education: Not on file  . Highest education level: Not on file  Occupational History  . Not on file  Tobacco Use  . Smoking status: Former Smoker    Quit date: 08/19/2013    Years since quitting: 5.8  . Smokeless tobacco: Current User    Types: Snuff  Substance and Sexual Activity  . Alcohol use: No  . Drug use: No  . Sexual activity: Yes  Other Topics Concern  . Not on file  Social History Narrative   Live in private residence with spouse and mother in law   Social Determinants of Health   Financial Resource Strain:   . Difficulty of Paying Living Expenses: Not on file  Food Insecurity:   . Worried About Charity fundraiser in the Last Year: Not on file  . Ran Out of Food in the Last Year: Not on file  Transportation Needs:   . Lack of Transportation (Medical): Not on file  . Lack of Transportation (Non-Medical): Not on file  Physical Activity:   . Days of Exercise per Week: Not on file  . Minutes of Exercise per Session: Not on file  Stress:   . Feeling of Stress : Not on file  Social Connections:   . Frequency of Communication with Friends and Family: Not on file  . Frequency of Social Gatherings with Friends and Family: Not on file  . Attends Religious Services: Not on file  . Active Member of Clubs or Organizations: Not on file  . Attends Archivist Meetings: Not on file  . Marital Status: Not on file  Intimate Partner Violence:   . Fear of Current or Ex-Partner: Not on file  . Emotionally Abused: Not on file  . Physically Abused: Not on file  . Sexually Abused: Not on file    Family History  Problem Relation Age of Onset  . Cancer Mother 4       Pancreatic  . COPD Mother   . Diabetes Mother   . Hyperlipidemia Mother   . Hypertension Mother   . Cancer Maternal Uncle        pancreatic  . Cancer Maternal Grandmother 35       colon  . COPD Maternal Grandmother   . Diabetes Maternal  Grandmother   . Hypertension Maternal Grandmother      Current Outpatient Medications:  .  aspirin EC 81 MG tablet, Take 81 mg by mouth daily., Disp: , Rfl:  .  baclofen (LIORESAL) 10 MG tablet, Take 1-2 tablets (10-20 mg total) by mouth 4 (four) times daily., Disp: 240 tablet, Rfl: 5 .  gabapentin (NEURONTIN) 600 MG tablet, Take 1 tablet (600 mg total) by mouth 3 (three) times daily., Disp: 90 tablet, Rfl: 5 .  lenalidomide (REVLIMID) 5 MG capsule, TAKE 1 CAPSULE BY MOUTH DAILY, Disp: 28 capsule, Rfl: 0 .  oxyCODONE (OXY IR/ROXICODONE) 5 MG immediate release tablet, Take 1 tablet (5 mg total) by mouth every 8 (eight) hours as needed for severe pain. Must last 30 days, Disp: 90 tablet, Rfl: 0 .  [START ON 07/11/2019] oxyCODONE (OXY IR/ROXICODONE) 5 MG immediate release tablet, Take 1 tablet (5 mg total) by mouth every 8 (  eight) hours as needed for severe pain. Must last 30 days, Disp: 90 tablet, Rfl: 0 .  sertraline (ZOLOFT) 25 MG tablet, TAKE 1 TABLET (25 MG TOTAL) BY MOUTH DAILY., Disp: 30 tablet, Rfl: 1 .  venlafaxine XR (EFFEXOR-XR) 37.5 MG 24 hr capsule, TAKE 1 CAPSULE BY MOUTH DAILY WITH BREAKFAST FOR 7 DAYS AND IF NO SIDE EFFECTS THEN INCREASE TO 2 CAPS FOR THE REST OF THE MONTH, Disp: 53 capsule, Rfl: 0 .  oxyCODONE (OXY IR/ROXICODONE) 5 MG immediate release tablet, Take 1 tablet (5 mg total) by mouth every 8 (eight) hours as needed for severe pain. Must last 30 days, Disp: 90 tablet, Rfl: 0  Physical exam:  Vitals:   06/22/19 1515  BP: (!) 156/111  Pulse: 79  Resp: 16  Temp: 97.8 F (36.6 C)  TempSrc: Tympanic  Weight: 143 lb 12.8 oz (65.2 kg)   Physical Exam HENT:     Head: Normocephalic and atraumatic.  Pulmonary:     Effort: Pulmonary effort is normal.  Musculoskeletal:     Cervical back: Normal range of motion.  Skin:    General: Skin is warm and dry.  Neurological:     Mental Status: He is alert and oriented to person, place, and time.      CMP Latest Ref Rng &  Units 06/22/2019  Glucose 70 - 99 mg/dL 112(H)  BUN 6 - 20 mg/dL 20  Creatinine 0.61 - 1.24 mg/dL 1.47(H)  Sodium 135 - 145 mmol/L 138  Potassium 3.5 - 5.1 mmol/L 3.6  Chloride 98 - 111 mmol/L 103  CO2 22 - 32 mmol/L 23  Calcium 8.9 - 10.3 mg/dL 9.0  Total Protein 6.5 - 8.1 g/dL 6.5  Total Bilirubin 0.3 - 1.2 mg/dL 0.5  Alkaline Phos 38 - 126 U/L 60  AST 15 - 41 U/L 19  ALT 0 - 44 U/L 14   CBC Latest Ref Rng & Units 06/22/2019  WBC 4.0 - 10.5 K/uL 5.3  Hemoglobin 13.0 - 17.0 g/dL 13.8  Hematocrit 39.0 - 52.0 % 40.3  Platelets 150 - 400 K/uL 151     Assessment and plan- Patient is a 49 y.o. male with multiple myeloma on maintenance Revlimid here for routine follow-up   Patient continues to tolerate Revlimid well without any significant side effects.  His labs including CBC with differential and CMP are stable.  He has baseline CKD and his creatinine is stable around 1.5.  Myeloma panel does not show any M protein and serum free light chains are normal.  Patient will continue to get monthly Xgeva and check his CMP monthly.  Given his stability of labs I will see him back in 3 months with CBC with differential CMP myeloma panel and serum free light chains.   Chemo-induced peripheral neuropathy: He is currently on as needed oxycodone and gabapentin.  He has appointment with pain clinic next month.   Depression: He was started on Effexor and he did not wean himself off Zoloft.  I have asked him to try to come off Zoloft and only stay on 1 medicine which would be Effexor for now.  Patient verbalized understanding    Visit Diagnosis 1. Multiple myeloma in remission ([place])   2. High risk medication use   3. Long term (current) use of bisphosphonates      Dr. Randa Evens, MD, MPH [facility] [phone] 06/24/2019 8:16 AM

## 2019-06-25 LAB — MULTIPLE MYELOMA PANEL, SERUM
Albumin SerPl Elph-Mcnc: 3.8 g/dL (ref 2.9–4.4)
Albumin/Glob SerPl: 2 — ABNORMAL HIGH (ref 0.7–1.7)
Alpha 1: 0.1 g/dL (ref 0.0–0.4)
Alpha2 Glob SerPl Elph-Mcnc: 0.5 g/dL (ref 0.4–1.0)
B-Globulin SerPl Elph-Mcnc: 0.8 g/dL (ref 0.7–1.3)
Gamma Glob SerPl Elph-Mcnc: 0.5 g/dL (ref 0.4–1.8)
Globulin, Total: 2 g/dL — ABNORMAL LOW (ref 2.2–3.9)
IgA: 165 mg/dL (ref 90–386)
IgG (Immunoglobin G), Serum: 730 mg/dL (ref 603–1613)
IgM (Immunoglobulin M), Srm: 32 mg/dL (ref 20–172)
Total Protein ELP: 5.8 g/dL — ABNORMAL LOW (ref 6.0–8.5)

## 2019-07-12 ENCOUNTER — Other Ambulatory Visit: Payer: Self-pay | Admitting: Oncology

## 2019-07-12 MED FILL — VENLAFAXINE HCL ER 37.5 MG: 37.5 | 27 days supply | Qty: 53 | Fill #0

## 2019-07-12 MED FILL — SERTRALINE HCL 25 MG TABLET: 25 | 30 days supply | Qty: 30 | Fill #1

## 2019-07-12 MED FILL — GABAPENTIN 600 MG TABLET: 600 | 30 days supply | Qty: 90 | Fill #4

## 2019-07-13 MED FILL — oxyCODONE HCL 5 MG TABS: 5 | 30 days supply | Qty: 90 | Fill #0

## 2019-07-14 ENCOUNTER — Other Ambulatory Visit: Payer: Self-pay | Admitting: Oncology

## 2019-07-14 DIAGNOSIS — C9001 Multiple myeloma in remission: Secondary | ICD-10-CM

## 2019-07-20 ENCOUNTER — Other Ambulatory Visit: Payer: Self-pay

## 2019-07-20 DIAGNOSIS — C9001 Multiple myeloma in remission: Secondary | ICD-10-CM

## 2019-07-20 MED ORDER — LENALIDOMIDE 5 MG PO CAPS: ORAL_CAPSULE | ORAL | 0 refills | Status: DC

## 2019-07-21 ENCOUNTER — Inpatient Hospital Stay: Payer: No Typology Code available for payment source

## 2019-07-21 ENCOUNTER — Other Ambulatory Visit: Payer: Self-pay

## 2019-07-21 DIAGNOSIS — C9001 Multiple myeloma in remission: Secondary | ICD-10-CM

## 2019-07-21 DIAGNOSIS — Z9481 Bone marrow transplant status: Secondary | ICD-10-CM

## 2019-07-21 DIAGNOSIS — R7989 Other specified abnormal findings of blood chemistry: Secondary | ICD-10-CM

## 2019-07-21 DIAGNOSIS — Z299 Encounter for prophylactic measures, unspecified: Secondary | ICD-10-CM

## 2019-07-21 LAB — COMPREHENSIVE METABOLIC PANEL
ALT: 20 U/L (ref 0–44)
AST: 25 U/L (ref 15–41)
Albumin: 3.9 g/dL (ref 3.5–5.0)
Alkaline Phosphatase: 86 U/L (ref 38–126)
Anion gap: 6 (ref 5–15)
BUN: 15 mg/dL (ref 6–20)
CO2: 28 mmol/L (ref 22–32)
Calcium: 8.5 mg/dL — ABNORMAL LOW (ref 8.9–10.3)
Chloride: 102 mmol/L (ref 98–111)
Creatinine, Ser: 1.44 mg/dL — ABNORMAL HIGH (ref 0.61–1.24)
GFR calc Af Amer: >60 mL/min (ref >60)
GFR calc non Af Amer: 57 mL/min — ABNORMAL LOW (ref >60)
Glucose, Bld: 56 mg/dL — ABNORMAL LOW (ref 70–99)
Potassium: 3.4 mmol/L — ABNORMAL LOW (ref 3.5–5.1)
Sodium: 136 mmol/L (ref 135–145)
Total Bilirubin: 0.4 mg/dL (ref 0.3–1.2)
Total Protein: 6.5 g/dL (ref 6.5–8.1)

## 2019-07-21 MED ORDER — DENOSUMAB 120 MG/1.7ML ~~LOC~~ SOLN
120.0000 mg | Freq: Once | SUBCUTANEOUS | Status: AC
  Administered 2019-07-21: 120 mg via SUBCUTANEOUS
  Filled 2019-07-21: qty 1.7

## 2019-07-21 MED FILL — BACLOFEN 10 MG TABS: 10 | 30 days supply | Qty: 240 | Fill #2

## 2019-07-22 ENCOUNTER — Encounter: Payer: Self-pay | Admitting: Pain Medicine

## 2019-07-25 NOTE — Progress Notes (Signed)
Patient: James House.  Service Category: E/M  Provider: Gaspar Cola, MD  DOB: [birth year]  DOS: 07/26/2019  Location: Office  MRN: [id number]  Setting: Ambulatory outpatient  Referring Provider: Pleas Koch, NP  Type: Established Patient  Specialty: Interventional Pain Management  PCP: Pleas Koch, NP  Location: Remote location  Delivery: TeleHealth     Virtual Encounter - Pain Management PROVIDER NOTE: Information contained herein reflects review and annotations entered in association with encounter. Interpretation of such information and data should be left to medically-trained personnel. Information provided to patient can be located elsewhere in the medical record under "Patient Instructions". Document created using STT-dictation technology, any transcriptional errors that may result from process are unintentional.    Contact & Pharmacy Preferred: [phone] Home: [phone] (home) Mobile: [phone] (mobile) E-mail: [email or web address]  [place], [place] [address] Phone: [phone] Fax: [phone]  [facility] - [place], [place] [address] Phone: [phone] Fax: [phone]   Pre-screening  Mr. Fickel offered "in-person" vs "virtual" encounter. He indicated preferring virtual for this encounter.   Reason COVID-19*  Social distancing based on CDC and AMA recommendations.   I contacted [name] on 07/26/2019 via telephone.      I clearly identified myself as Gaspar Cola, MD. I verified that I was speaking with the correct person using two identifiers (Name: Pike Scantlebury., and date of birth: [birth year]).  Consent I sought verbal advanced consent from [name] for virtual visit interactions. I informed Mr. Vieth of possible security and privacy concerns, risks, and limitations  associated with providing "not-in-person" medical evaluation and management services. I also informed Mr. Bou of the availability of "in-person" appointments. Finally, I informed him that there would be a charge for the virtual visit and that he could be  personally, fully or partially, financially responsible for it. Mr. Laviolette expressed understanding and agreed to proceed.   Historic Elements   Mr. Nicholaus Steinke. is a 49 y.o. year old, male patient evaluated today after his last contact with our practice on 02/10/2019. Mr. Luckenbach  has a past medical history of CKD (chronic kidney disease) stage 3, GFR 30-59 ml/min, Hypertension, Multiple myeloma ([place]) (08/11/2017), Neuropathy, and Pneumonia. He also  has a past surgical history that includes Joint replacement; Knee surgery (Left); and Abdominal surgery. Mr. Nee has a current medication list which includes the following prescription(s): aspirin ec, [START ON 08/12/2019] baclofen, gabapentin, [START ON 08/12/2019] gabapentin, lenalidomide, [START ON 08/10/2019] oxycodone, [START ON 09/09/2019] oxycodone, [START ON 10/09/2019] oxycodone, sertraline, and venlafaxine xr. He  reports that he quit smoking about 5 years ago. His smokeless tobacco use includes snuff. He reports that he does not drink alcohol or use drugs. Mr. Narine has No Known Allergies.   HPI  Today, he is being contacted for medication management. The patient indicates doing well with the current medication regimen. No adverse reactions or side effects reported to the medications.  Today the patient indicated wondering whether or not we could do something to improve the pain from his neuropathy.  I noticed that he has been taking the gabapentin 600 mg p.o. 3 times daily.  I asked him if he thought that he could tolerate the higher dose and he indicated that he would not mind trying it.  I have therefore sent a note prescription for  gabapentin 300 mg to be taken along with that 600 mg pill to  determine whether or not he can tolerate it and whether or not it can provide him with better relief of the pain.  I have provided instructions for him to start by adding 1 pill to his 3 times daily regiment and after 1 week, then he can go to twice daily, followed another week later by a third daily pill.  On his next encounter we will go over this gabapentin titration to see if he has helped him.  The patient indicated understanding well the instructions.  Pharmacotherapy Assessment  Analgesic: Oxycodone IR 5 mg, 1 tab PO BID (10 mg/day of oxycodone) MME/day: 15 mg/day.   Monitoring: [place] PMP: PDMP reviewed during this encounter.       Pharmacotherapy: No side-effects or adverse reactions reported. Compliance: No problems identified. Effectiveness: Clinically acceptable. Plan: Refer to "POC".  UDS:  Summary  Date Value Ref Range Status  04/02/2018 FINAL  Final    Comment:    ==================================================================== TOXASSURE SELECT 13 (MW) ==================================================================== Test                             Result       Flag       Units Drug Present and Declared for Prescription Verification   Oxycodone                      138          EXPECTED   ng/mg creat   Oxymorphone                    52           EXPECTED   ng/mg creat   Noroxycodone                   135          EXPECTED   ng/mg creat    Sources of oxycodone include scheduled prescription medications.    Oxymorphone and noroxycodone are expected metabolites of    oxycodone. Oxymorphone is also available as a scheduled    prescription medication. ==================================================================== Test                      Result    Flag   Units      Ref Range   Creatinine              168              mg/dL      >=20 ==================================================================== Declared Medications:  The flagging and interpretation on  this report are based on the  following declared medications.  Unexpected results may arise from  inaccuracies in the declared medications.  **Note: The testing scope of this panel includes these medications:  Oxycodone  **Note: The testing scope of this panel does not include following  reported medications:  Acetaminophen  Acyclovir  Amoxicillin (Augmentin)  Aspirin (Aspirin 81)  Baclofen  Clavulinate (Augmentin)  Gabapentin  Lenalidomide  Meloxicam  Pantoprazole  Prednisone  Pregabalin  Promethazine  Vitamin D2 (Ergocalciferol) ==================================================================== For clinical consultation, please call [phone]. ====================================================================    Laboratory Chemistry Profile   Renal Lab Results  Component Value Date   BUN 15 07/21/2019   CREATININE 1.44 (H) 07/21/2019   BCR 7 (L) 11/26/2017   GFRAA >60 07/21/2019  GFRNONAA 57 (L) 07/21/2019     Hepatic Lab Results  Component Value Date   AST 25 07/21/2019   ALT 20 07/21/2019   ALBUMIN 3.9 07/21/2019   ALKPHOS 86 07/21/2019   LIPASE 52 (H) 12/22/2017     Electrolytes Lab Results  Component Value Date   NA 136 07/21/2019   K 3.4 (L) 07/21/2019   CL 102 07/21/2019   CALCIUM 8.5 (L) 07/21/2019   MG 2.4 (H) 11/26/2017     Bone Lab Results  Component Value Date   25OHVITD1 41 11/26/2017   25OHVITD2 4.4 11/26/2017   25OHVITD3 37 11/26/2017   TESTOSTERONE 570 12/29/2017     Inflammation (CRP: Acute Phase) (ESR: Chronic Phase) Lab Results  Component Value Date   CRP 9 11/26/2017   ESRSEDRATE 16 (H) 11/26/2017   LATICACIDVEN 1.1 08/11/2017       Note: Above Lab results reviewed.  Imaging  DG Bone Survey Met CLINICAL DATA:  Multiple myeloma, currently in remission.  EXAM: METASTATIC BONE SURVEY  COMPARISON:  CT scan 12/22/2017  FINDINGS: The lateral skull film demonstrates numerous small lytic  myelomatous lesions.  A few small scattered lytic myelomatous lesions are suspected in the clavicles and proximal humeri bilaterally.  Do not see any obvious lesions involving the spine or ribs. Remote healed rib fractures are noted.  No definite lesions involving the bony pelvis or lower extremities.  IMPRESSION: 1. Suspect small lytic myelomatous lesions involving the skull, clavicles and bilateral proximal humeri. 2. No other definite lesions are identified.  Electronically Signed   By: Marijo Sanes M.D.   On: 10/22/2018 15:51  Assessment  The primary encounter diagnosis was Chronic pain syndrome. Diagnoses of Spasm of back muscles, Chronic musculoskeletal pain, Neuropathic pain, Neurogenic pain, and Chemotherapy-induced peripheral neuropathy ([place]) were also pertinent to this visit.  Plan of Care  Problem-specific:  No problem-specific Assessment & Plan notes found for this encounter.  Mr. Willman Cuny. has a current medication list which includes the following long-term medication(s): [START ON 08/12/2019] baclofen, gabapentin, [START ON 08/12/2019] gabapentin, [START ON 08/10/2019] oxycodone, [START ON 09/09/2019] oxycodone, [START ON 10/09/2019] oxycodone, sertraline, and venlafaxine xr.  Pharmacotherapy (Medications Ordered): Meds ordered this encounter  Medications  . oxyCODONE (OXY IR/ROXICODONE) 5 MG immediate release tablet    Sig: Take 1 tablet (5 mg total) by mouth every 8 (eight) hours as needed for severe pain. Must last 30 days    Dispense:  90 tablet    Refill:  0    Chronic Pain: STOP Act (Not applicable) Fill 1 day early if closed on refill date. Do not fill until: 08/10/2019. To last until: 09/09/2019. Avoid benzodiazepines within 8 hours of opioids  . oxyCODONE (OXY IR/ROXICODONE) 5 MG immediate release tablet    Sig: Take 1 tablet (5 mg total) by mouth every 8 (eight) hours as needed for severe pain. Must last 30 days    Dispense:  90 tablet    Refill:  0     Chronic Pain: STOP Act (Not applicable) Fill 1 day early if closed on refill date. Do not fill until: 09/09/2019. To last until: 10/09/2019. Avoid benzodiazepines within 8 hours of opioids  . oxyCODONE (OXY IR/ROXICODONE) 5 MG immediate release tablet    Sig: Take 1 tablet (5 mg total) by mouth every 8 (eight) hours as needed for severe pain. Must last 30 days    Dispense:  90 tablet    Refill:  0    Chronic Pain:  STOP Act (Not applicable) Fill 1 day early if closed on refill date. Do not fill until: 10/09/2019. To last until: 11/08/2019. Avoid benzodiazepines within 8 hours of opioids  . baclofen (LIORESAL) 10 MG tablet    Sig: Take 1-2 tablets (10-20 mg total) by mouth 4 (four) times daily.    Dispense:  240 tablet    Refill:  5    Fill one day early if pharmacy is closed on scheduled refill date. May substitute for generic if available.  . gabapentin (NEURONTIN) 600 MG tablet    Sig: Take 1 tablet (600 mg total) by mouth 3 (three) times daily.    Dispense:  90 tablet    Refill:  5    Fill one day early if pharmacy is closed on scheduled refill date. May substitute for generic if available.  . gabapentin (NEURONTIN) 300 MG capsule    Sig: Take 1 capsule (300 mg total) by mouth daily for 7 days, THEN 1 capsule (300 mg total) 2 (two) times daily for 7 days, THEN 1 capsule (300 mg total) 3 (three) times daily.    Dispense:  87 capsule    Refill:  5    Fill one day early if pharmacy is closed on scheduled refill date. May substitute for generic if available.   Orders:  No orders of the defined types were placed in this encounter.  Follow-up plan:   Return in about 15 weeks (around 11/08/2019) for (VV), (MM).      Interventional management options:  Considering:   DiagnosticbilateralLESI Possible bilateral lumbar facet RFA Diagnostic bilateral sacroiliac joint block Possible bilateral sacroiliac joint RFA Diagnostic right knee Hyalgan series Diagnosticright knee intra-articular  knee injection Diagnostic left knee genicular nerve block   Palliative PRN treatment(s):   Palliative bilateral IA hip joint injection #2 (50/50/50/50) Palliative bilateral lumbar facet block #3 (100/100/0) (60/60/60) Palliative left piriformis muscle injection #2 (100/100/0)      Recent Visits Date Type Provider Dept  05/12/19 Telemedicine Milinda Pointer, MD [facility]  Showing recent visits within past 90 days and meeting all other requirements   Today's Visits Date Type Provider Dept  07/26/19 Telemedicine Milinda Pointer, MD [facility]  Showing today's visits and meeting all other requirements   Future Appointments No visits were found meeting these conditions.  Showing future appointments within next 90 days and meeting all other requirements   I discussed the assessment and treatment plan with the patient. The patient was provided an opportunity to ask questions and all were answered. The patient agreed with the plan and demonstrated an understanding of the instructions.  Patient advised to call back or seek an in-person evaluation if the symptoms or condition worsens.  Duration of encounter: 16 minutes.  Note by: Gaspar Cola, MD Date: 07/26/2019; Time: 12:46 PM

## 2019-07-26 ENCOUNTER — Ambulatory Visit: Payer: No Typology Code available for payment source | Attending: Pain Medicine | Admitting: Pain Medicine

## 2019-07-26 ENCOUNTER — Other Ambulatory Visit: Payer: Self-pay

## 2019-07-26 ENCOUNTER — Other Ambulatory Visit: Payer: Self-pay | Admitting: Pain Medicine

## 2019-07-26 DIAGNOSIS — M7918 Myalgia, other site: Secondary | ICD-10-CM

## 2019-07-26 DIAGNOSIS — G894 Chronic pain syndrome: Secondary | ICD-10-CM | POA: Diagnosis not present

## 2019-07-26 DIAGNOSIS — M62830 Muscle spasm of back: Secondary | ICD-10-CM | POA: Diagnosis not present

## 2019-07-26 DIAGNOSIS — G8929 Other chronic pain: Secondary | ICD-10-CM

## 2019-07-26 DIAGNOSIS — G620 Drug-induced polyneuropathy: Secondary | ICD-10-CM

## 2019-07-26 DIAGNOSIS — M792 Neuralgia and neuritis, unspecified: Secondary | ICD-10-CM

## 2019-07-26 DIAGNOSIS — T451X5A Adverse effect of antineoplastic and immunosuppressive drugs, initial encounter: Secondary | ICD-10-CM

## 2019-07-26 MED ORDER — GABAPENTIN 300 MG PO CAPS: ORAL_CAPSULE | ORAL | 5 refills | Status: DC

## 2019-07-26 MED ORDER — OXYCODONE HCL 5 MG PO TABS: 5.0000 mg | ORAL_TABLET | Freq: Three times a day (TID) | ORAL | 0 refills | Status: DC | PRN

## 2019-07-26 MED ORDER — GABAPENTIN 600 MG PO TABS: 600.0000 mg | ORAL_TABLET | Freq: Three times a day (TID) | ORAL | 5 refills | Status: DC

## 2019-07-26 MED ORDER — BACLOFEN 10 MG PO TABS: 10.0000 mg | ORAL_TABLET | Freq: Four times a day (QID) | ORAL | 5 refills | Status: DC

## 2019-07-26 MED FILL — GABAPENTIN 300 MG CAPSULE: 300 | 36 days supply | Qty: 87 | Fill #0

## 2019-08-02 ENCOUNTER — Telehealth: Payer: Self-pay | Admitting: Pain Medicine

## 2019-08-02 ENCOUNTER — Other Ambulatory Visit: Payer: Self-pay

## 2019-08-02 ENCOUNTER — Encounter: Payer: Self-pay | Admitting: Family Medicine

## 2019-08-02 ENCOUNTER — Ambulatory Visit (INDEPENDENT_AMBULATORY_CARE_PROVIDER_SITE_OTHER): Payer: No Typology Code available for payment source | Admitting: Family Medicine

## 2019-08-02 VITALS — BP 138/86 | HR 87 | Temp 98.3°F | Resp 28 | Ht 67.0 in | Wt 139.5 lb

## 2019-08-02 DIAGNOSIS — B029 Zoster without complications: Secondary | ICD-10-CM | POA: Diagnosis not present

## 2019-08-02 MED ORDER — VALACYCLOVIR HCL 1 G PO TABS: 1000.0000 mg | ORAL_TABLET | Freq: Three times a day (TID) | ORAL | 0 refills | Status: DC

## 2019-08-02 MED FILL — valACYclovir HCL 1 GM TABS: 1 | 7 days supply | Qty: 21 | Fill #0

## 2019-08-02 NOTE — Telephone Encounter (Signed)
Patient reports outbreak started on yesterday and is located on right side of abdomen.  States they are excruciating.  PCP gave him valcyclovir 1 gm po tid.  Would not give him anything for pain.  I have told patient that Dr Dossie Arbour was out of office but I would send to Dr Holley Raring.

## 2019-08-02 NOTE — Patient Instructions (Signed)
Consider reaching out to pain doctor for recommendations  Contagious until no active vesicles and ulcers present Keep covered and wash hands

## 2019-08-02 NOTE — Progress Notes (Signed)
Subjective:     James House. is a 49 y.o. male presenting for Rash (started to worsen 08/01/19. Located on abdominal area RLQ and right leg, right hip, right side of back.)     HPI  #Rash - not itchy - painful - started on 08/01/2019 - jolting pain "to the bones" like a tens unit feeling - endorses some chills last night - works as an Clinical biochemist - was pulling wire inside yesterday - no contact to plants  Currently on chemo drug - Revlimid  Also followed by pain on oxycodone and gabapentin -- which are not helping with symptoms  Also lives with his Mother in law and is around his pregnant step daughter and step granddaughter  Review of Systems   Social History   Tobacco Use  Smoking Status Former Smoker  . Quit date: 08/19/2013  . Years since quitting: 5.9  Smokeless Tobacco Current User  . Types: Snuff        Objective:    BP Readings from Last 3 Encounters:  08/02/19 138/86  06/22/19 (!) 156/111  04/20/19 134/89   Wt Readings from Last 3 Encounters:  08/02/19 139 lb 8 oz (63.3 kg)  06/22/19 143 lb 12.8 oz (65.2 kg)  04/20/19 146 lb (66.2 kg)    BP 138/86   Pulse 87   Temp 98.3 F (36.8 C)   Resp (!) 28   Ht 5\' 7"  (1.702 m)   Wt 139 lb 8 oz (63.3 kg)   SpO2 100%   BMI 21.85 kg/m    Physical Exam Constitutional:      Appearance: Normal appearance. He is not ill-appearing or diaphoretic.     Comments: Difficulty standing up out of the chair due to pain  HENT:     Right Ear: External ear normal.     Left Ear: External ear normal.     Nose: Nose normal.  Eyes:     General: No scleral icterus.    Extraocular Movements: Extraocular movements intact.     Conjunctiva/sclera: Conjunctivae normal.  Cardiovascular:     Rate and Rhythm: Normal rate.  Pulmonary:     Effort: Pulmonary effort is normal.  Musculoskeletal:     Cervical back: Neck supple.  Skin:    General: Skin is warm and dry.     Comments: Right low back, low abdomen and hip  and upper right thigh with vesicular rash on an erythematous base.   Neurological:     Mental Status: He is alert. Mental status is at baseline.  Psychiatric:        Mood and Affect: Mood normal.           Assessment & Plan:   Problem List Items Addressed This Visit    None    Visit Diagnoses    Herpes zoster without complication    -  Primary   Relevant Medications   valACYclovir (VALTREX) 1000 MG tablet     Rash consistent with shingles with a few adjacent dermatomes. Suspect extent is due to immunosuppression, though do no suspect disseminated at this time. Return precautions discussed given risk factors.   Also advised avoiding other at risk family members until lesions no longer contagious.   He is already on several medications for pain and still having significant pain. Pain doctor recently increased medication and I advised reaching to discuss shingles and any other short term medications he could use.   Return if symptoms worsen or fail to improve.  Lesleigh Noe, MD

## 2019-08-02 NOTE — Telephone Encounter (Signed)
Patient called to see if he can get any suggestions for severe pain from shingles. I did inform him Dr. Dossie Arbour is out of town for 2 weeks, but that I would send back to nurses and maybe they could talk to another physician about this. Please call patient.

## 2019-08-03 ENCOUNTER — Other Ambulatory Visit: Payer: Self-pay | Admitting: Pain Medicine

## 2019-08-03 ENCOUNTER — Telehealth (INDEPENDENT_AMBULATORY_CARE_PROVIDER_SITE_OTHER): Payer: No Typology Code available for payment source | Admitting: Primary Care

## 2019-08-03 DIAGNOSIS — M792 Neuralgia and neuritis, unspecified: Secondary | ICD-10-CM

## 2019-08-03 DIAGNOSIS — R197 Diarrhea, unspecified: Secondary | ICD-10-CM | POA: Diagnosis not present

## 2019-08-03 DIAGNOSIS — B029 Zoster without complications: Secondary | ICD-10-CM | POA: Diagnosis not present

## 2019-08-03 HISTORY — DX: Zoster without complications: B02.9

## 2019-08-03 MED FILL — PREGABALIN 150 MG CAPS: 150 | 30 days supply | Qty: 60 | Fill #0

## 2019-08-03 NOTE — Telephone Encounter (Signed)
LP called patient to inform him.

## 2019-08-03 NOTE — Telephone Encounter (Signed)
Called patient back this a.m. to convey what Dr Holley Raring had said.  I told him about Lyrica and Capsacin cream with warning that it may really burn.  He states he didn't think he could get lyrica because it was the same as gabapentin.  I did tell him that as Dr Holley Raring put it, he had failed a course of gabapentin and that sometimes patients who could not take that could successfully take Lyrica.  If he had any problems getting lyrica to please call the office back and we would see if we could help him.

## 2019-08-03 NOTE — Patient Instructions (Signed)
Try the Lyrica prescription as discussed.  Continue valacyclovir for shingles.  Please notify me if your diarrhea progresses as discussed.  It was a pleasure to see you today! Allie Bossier, NP-

## 2019-08-03 NOTE — Assessment & Plan Note (Signed)
Increased given recent diagnosis of herpes zoster. Discussed use of Lyrica that is prescribed by his pain management MD. Continue other pain regimen as well.

## 2019-08-03 NOTE — Progress Notes (Signed)
Subjective:    Patient ID: James Box., male    DOB: 09-04-1970, 49 y.o.   MRN: [id number]  HPI  Virtual Visit via Video Note  I connected with James Box. on 08/03/19 at  9:20 AM EDT by a video enabled telemedicine application and verified that I am speaking with the correct person using two identifiers.  Location: Patient: Home Provider: Office   I discussed the limitations of evaluation and management by telemedicine and the availability of in person appointments. The patient expressed understanding and agreed to proceed.  History of Present Illness:  Mr. James House is a 49 year old male with a history of chronic back pain, CKD, multiple myeloma who presents today with a chief complaint of diarrhea.  He was evaluated yesterday by Dr. Einar Pheasant for symptoms of painful, non itchy rash that began on 08/01/19. Exam consistent for herpes zoster so he was treated with valacyclovir 1000 mg course.  He's taken four total doses of valacyclovir since yesterday. His diarrhea began yesterday afternoon after two courses of his valacyclovir. He's had three episodes of diarrhea total.  He is taking 600 mg of gabapentin TID and oxycodone 5 mg TID. He does have a prescription for Lyrica 150 mg per his pain management doctor, he will go pick this prescription up today. He had to remain out of work today, may also have to stay out tomorrow. He may need a work note for tomorrow but he would like to try to return to work.   Observations/Objective:  Alert and oriented. Appears well, not sickly. No distress. Speaking in complete sentences.   Assessment and Plan:  Diarrhea likely secondary to valacyclovir courses. No other symptoms, doesn't appear sickly. So far diarrhea is mild, he will update if he starts to notice >4 episodes daily. Continue pain management regimen, he will pick up Lyrica. Will support another day out of work if needed, he will update tomorrow.  Follow Up  Instructions:  Try the Lyrica prescription as discussed.  Continue valacyclovir for shingles.  Please notify me if your diarrhea progresses as discussed.  It was a pleasure to see you today! Allie Bossier, NP-C    I discussed the assessment and treatment plan with the patient. The patient was provided an opportunity to ask questions and all were answered. The patient agreed with the plan and demonstrated an understanding of the instructions.   The patient was advised to call back or seek an in-person evaluation if the symptoms worsen or if the condition fails to improve as anticipated.    Pleas Koch, NP    Review of Systems  Constitutional: Negative for fever.  Gastrointestinal: Positive for diarrhea. Negative for abdominal pain and nausea.  Musculoskeletal: Positive for arthralgias and myalgias.  Skin: Positive for rash.       Past Medical History:  Diagnosis Date  . CKD (chronic kidney disease) stage 3, GFR 30-59 ml/min   . Hypertension   . Multiple myeloma ([place]) 08/11/2017  . Neuropathy   . Pneumonia      Social History   Socioeconomic History  . Marital status: Married    Spouse name: [name]  . Number of children: 3  . Years of education: Not on file  . Highest education level: Not on file  Occupational History  . Not on file  Tobacco Use  . Smoking status: Former Smoker    Quit date: 08/19/2013    Years since quitting: 5.9  . Smokeless tobacco:  Current User    Types: Snuff  Substance and Sexual Activity  . Alcohol use: No  . Drug use: No  . Sexual activity: Yes  Other Topics Concern  . Not on file  Social History Narrative   Live in private residence with spouse and mother in law   Social Determinants of Health   Financial Resource Strain:   . Difficulty of Paying Living Expenses:   Food Insecurity:   . Worried About Charity fundraiser in the Last Year:   . Arboriculturist in the Last Year:   Transportation Needs:   . Lexicographer (Medical):   Marland Kitchen Lack of Transportation (Non-Medical):   Physical Activity:   . Days of Exercise per Week:   . Minutes of Exercise per Session:   Stress:   . Feeling of Stress :   Social Connections:   . Frequency of Communication with Friends and Family:   . Frequency of Social Gatherings with Friends and Family:   . Attends Religious Services:   . Active Member of Clubs or Organizations:   . Attends Archivist Meetings:   Marland Kitchen Marital Status:   Intimate Partner Violence:   . Fear of Current or Ex-Partner:   . Emotionally Abused:   Marland Kitchen Physically Abused:   . Sexually Abused:     Past Surgical History:  Procedure Laterality Date  . ABDOMINAL SURGERY    . JOINT REPLACEMENT     right knee  . KNEE SURGERY Left     Family History  Problem Relation Age of Onset  . Cancer Mother 3       Pancreatic  . COPD Mother   . Diabetes Mother   . Hyperlipidemia Mother   . Hypertension Mother   . Cancer Maternal Uncle        pancreatic  . Cancer Maternal Grandmother 23       colon  . COPD Maternal Grandmother   . Diabetes Maternal Grandmother   . Hypertension Maternal Grandmother     No Known Allergies  Current Outpatient Medications on File Prior to Visit  Medication Sig Dispense Refill  . aspirin EC 81 MG tablet Take 81 mg by mouth daily.    Derrill Memo ON 08/12/2019] baclofen (LIORESAL) 10 MG tablet Take 1-2 tablets (10-20 mg total) by mouth 4 (four) times daily. 240 tablet 5  . gabapentin (NEURONTIN) 300 MG capsule Take 1 capsule (300 mg total) by mouth daily for 7 days, THEN 1 capsule (300 mg total) 2 (two) times daily for 7 days, THEN 1 capsule (300 mg total) 3 (three) times daily. 87 capsule 5  . [START ON 08/12/2019] gabapentin (NEURONTIN) 600 MG tablet Take 1 tablet (600 mg total) by mouth 3 (three) times daily. 90 tablet 5  . lenalidomide (REVLIMID) 5 MG capsule TAKE 1 CAPSULE BY MOUTH ONE TIME DAILY. 28 capsule 0  . [START ON 08/10/2019] oxyCODONE (OXY  IR/ROXICODONE) 5 MG immediate release tablet Take 1 tablet (5 mg total) by mouth every 8 (eight) hours as needed for severe pain. Must last 30 days 90 tablet 0  . [START ON 09/09/2019] oxyCODONE (OXY IR/ROXICODONE) 5 MG immediate release tablet Take 1 tablet (5 mg total) by mouth every 8 (eight) hours as needed for severe pain. Must last 30 days 90 tablet 0  . [START ON 10/09/2019] oxyCODONE (OXY IR/ROXICODONE) 5 MG immediate release tablet Take 1 tablet (5 mg total) by mouth every 8 (eight) hours as needed for  severe pain. Must last 30 days 90 tablet 0  . pregabalin (LYRICA) 150 MG capsule Take 1 capsule (150 mg total) by mouth 2 (two) times daily. 60 capsule 1  . sertraline (ZOLOFT) 25 MG tablet TAKE 1 TABLET (25 MG TOTAL) BY MOUTH DAILY. 30 tablet 1  . valACYclovir (VALTREX) 1000 MG tablet Take 1 tablet (1,000 mg total) by mouth 3 (three) times daily. 21 tablet 0  . venlafaxine XR (EFFEXOR-XR) 37.5 MG 24 hr capsule TAKE 2 CAPSULES BY MOUTH DAILY 53 capsule 0   No current facility-administered medications on file prior to visit.    There were no vitals taken for this visit.   Objective:   Physical Exam  Constitutional: He is oriented to person, place, and time. He appears well-nourished.  Respiratory: Effort normal.  Neurological: He is alert and oriented to person, place, and time.  Psychiatric: He has a normal mood and affect.           Assessment & Plan:

## 2019-08-03 NOTE — Telephone Encounter (Signed)
Patient called stating he does not have Lyrica. Can Dr. Holley Raring write script for Lyrica to help with his shingles? Please let patient know.

## 2019-08-03 NOTE — Assessment & Plan Note (Signed)
Acute, diagnosed and treated yesterday. Continue valacyclovir as prescribed.  Has pain management regimen for pain.  Agree to provide additional day from work if needed, he will update.

## 2019-08-03 NOTE — Assessment & Plan Note (Signed)
Diarrhea likely secondary to valacyclovir courses. No other symptoms, doesn't appear sickly. So far diarrhea is mild, he will update if he starts to notice >4 episodes daily. Continue pain management regimen, he will pick up Lyrica. Will support another day out of work if needed, he will update tomorrow.

## 2019-08-09 ENCOUNTER — Telehealth: Payer: No Typology Code available for payment source | Admitting: Pain Medicine

## 2019-08-09 ENCOUNTER — Other Ambulatory Visit: Payer: Self-pay | Admitting: Oncology

## 2019-08-09 DIAGNOSIS — C9001 Multiple myeloma in remission: Secondary | ICD-10-CM

## 2019-08-12 ENCOUNTER — Telehealth: Payer: Self-pay

## 2019-08-12 NOTE — Telephone Encounter (Signed)
Pharmacy is questioning script from yesterday from Dr. Holley Raring for gabapentin 600,  She said he had a recent fill of gabapentin 300 mg and Lyrica 150 mg. She is questioning the dosage etc. Please call her back. Daphne at [place] [phone]. ( He is a Engineer, materials patient that Dr. Holley Raring saw as a courtesy yesterday.

## 2019-08-12 NOTE — Telephone Encounter (Signed)
Per Dr. Holley Raring- Patient is only to take the [facility]. Pharmacy called and all Gabapentin scripts and refills were cancelled(spoke with Daphne). Patient to ONLY take Lyrica. May call the office for VV with Dr. Dossie Arbour if needed next week.

## 2019-08-12 NOTE — Telephone Encounter (Signed)
Patient called and wants to speak with Dr. Dossie Arbour about the meds change. I set him up for 7:45 Monday. Please call for chart update.

## 2019-08-12 NOTE — Telephone Encounter (Signed)
I have a bubble note out to Dr. Holley Raring as of now.

## 2019-08-13 DIAGNOSIS — M5414 Radiculopathy, thoracic region: Secondary | ICD-10-CM | POA: Insufficient documentation

## 2019-08-13 MED FILL — oxyCODONE HCL 5 MG TABS: 5 | 30 days supply | Qty: 90 | Fill #0

## 2019-08-13 NOTE — Progress Notes (Signed)
Patient: James House.  Service Category: E/M  Provider: Gaspar Cola, MD  DOB: [birth year]  DOS: 08/16/2019  Location: Office  MRN: [id number]  Setting: Ambulatory outpatient  Referring Provider: Pleas Koch, NP  Type: Established Patient  Specialty: Interventional Pain Management  PCP: Pleas Koch, NP  Location: Remote location  Delivery: TeleHealth     Virtual Encounter - Pain Management PROVIDER NOTE: Information contained herein reflects review and annotations entered in association with encounter. Interpretation of such information and data should be left to medically-trained personnel. Information provided to patient can be located elsewhere in the medical record under "Patient Instructions". Document created using STT-dictation technology, any transcriptional errors that may result from process are unintentional.    Contact & Pharmacy Preferred: [phone] Home: [phone] (home) Mobile: [phone] (mobile) E-mail: [email or web address]  [facility], [place] [address] Phone: [phone] Fax: [phone]  [facility] - [place], [place] [address] Phone: [phone] Fax: [phone]   Pre-screening  Mr. Luckett offered "in-person" vs "virtual" encounter. He indicated preferring virtual for this encounter.   Reason COVID-19*  Social distancing based on CDC and AMA recommendations.   I contacted [name] on 08/16/2019 via telephone.      I clearly identified myself as Gaspar Cola, MD. I verified that I was speaking with the correct person using two identifiers (Name: Lorenso Quirino., and date of birth: 1970-10-04).  Consent I sought verbal advanced consent from [name] for virtual visit interactions. I informed Mr. Basquez of possible security and privacy concerns, risks, and limitations  associated with providing "not-in-person" medical evaluation and management services. I also informed Mr. Keech of the availability of "in-person" appointments. Finally, I informed him that there would be a charge for the virtual visit and that he could be  personally, fully or partially, financially responsible for it. Mr. Fuqua expressed understanding and agreed to proceed.   Historic Elements   Mr. Brenon Antosh. is a 49 y.o. year old, male patient evaluated today after his last contact with our practice on 08/12/2019. Mr. Gibbons  has a past medical history of CKD (chronic kidney disease) stage 3, GFR 30-59 ml/min, Hypertension, Multiple myeloma ([place]) (08/11/2017), Neuropathy, and Pneumonia. He also  has a past surgical history that includes Joint replacement; Knee surgery (Left); and Abdominal surgery. Mr. Issa has a current medication list which includes the following prescription(s): aspirin ec, baclofen, lenalidomide, oxycodone, [START ON 09/09/2019] oxycodone, [START ON 10/09/2019] oxycodone, sertraline, valacyclovir, venlafaxine xr, and pregabalin. He  reports that he quit smoking about 5 years ago. His smokeless tobacco use includes snuff. He reports that he does not drink alcohol or use drugs. Mr. Siegel has No Known Allergies.   HPI  Today, he is being contacted for medication management.  The last prescription for pregabalin was written by Dr. Holley Raring, while covering me on vacation.  Because the prescription was somewhat different from what I had been giving the patient, today I am contacting the patient to clarify how he is taking it.  In addition, I need to clarify if he is also taking the gabapentin.  The patient was recently diagnosed with herpes zoster (shingles) that began on 08/01/19.  The pain is located over the right abdominal area suggesting a right thoracic radiculitis.  His creatinine levels have gone up and therefore we need  to adjust his dose on the gabapentin.  If possible, rather have  him completely off of it.  Gabapentin stopped secondary to the increase in the creatinine levels and decreased GFR.  He refers that the pregabalin is actually working much better for the neuropathic pain from the shingles.  Today we talked about that and we have come to a decision to go ahead and do a thoracic epidural steroid injection to minimize the chances of developing a postoperative neurologic.   Pharmacotherapy Assessment  Analgesic: Oxycodone IR 5 mg, 1 tab PO BID (10 mg/day of oxycodone) MME/day: 15 mg/day.   Monitoring: [place] PMP: PDMP reviewed during this encounter.       Pharmacotherapy: No side-effects or adverse reactions reported. Compliance: No problems identified. Effectiveness: Clinically acceptable. Plan: Refer to "POC".  UDS:  Summary  Date Value Ref Range Status  04/02/2018 FINAL  Final    Comment:    ==================================================================== TOXASSURE SELECT 13 (MW) ==================================================================== Test                             Result       Flag       Units Drug Present and Declared for Prescription Verification   Oxycodone                      138          EXPECTED   ng/mg creat   Oxymorphone                    52           EXPECTED   ng/mg creat   Noroxycodone                   135          EXPECTED   ng/mg creat    Sources of oxycodone include scheduled prescription medications.    Oxymorphone and noroxycodone are expected metabolites of    oxycodone. Oxymorphone is also available as a scheduled    prescription medication. ==================================================================== Test                      Result    Flag   Units      Ref Range   Creatinine              168              mg/dL      >=20 ==================================================================== Declared Medications:  The flagging and interpretation on this report are based on the  following declared  medications.  Unexpected results may arise from  inaccuracies in the declared medications.  **Note: The testing scope of this panel includes these medications:  Oxycodone  **Note: The testing scope of this panel does not include following  reported medications:  Acetaminophen  Acyclovir  Amoxicillin (Augmentin)  Aspirin (Aspirin 81)  Baclofen  Clavulinate (Augmentin)  Gabapentin  Lenalidomide  Meloxicam  Pantoprazole  Prednisone  Pregabalin  Promethazine  Vitamin D2 (Ergocalciferol) ==================================================================== For clinical consultation, please call [phone]. ====================================================================    Laboratory Chemistry Profile   Renal Lab Results  Component Value Date   BUN 15 07/21/2019   CREATININE 1.44 (H) 07/21/2019   BCR 7 (L) 11/26/2017   GFRAA >60 07/21/2019   GFRNONAA 57 (L) 07/21/2019     Hepatic Lab Results  Component Value Date   AST  25 07/21/2019   ALT 20 07/21/2019   ALBUMIN 3.9 07/21/2019   ALKPHOS 86 07/21/2019   LIPASE 52 (H) 12/22/2017     Electrolytes Lab Results  Component Value Date   NA 136 07/21/2019   K 3.4 (L) 07/21/2019   CL 102 07/21/2019   CALCIUM 8.5 (L) 07/21/2019   MG 2.4 (H) 11/26/2017     Bone Lab Results  Component Value Date   25OHVITD1 41 11/26/2017   25OHVITD2 4.4 11/26/2017   25OHVITD3 37 11/26/2017   TESTOSTERONE 570 12/29/2017     Inflammation (CRP: Acute Phase) (ESR: Chronic Phase) Lab Results  Component Value Date   CRP 9 11/26/2017   ESRSEDRATE 16 (H) 11/26/2017   LATICACIDVEN 1.1 08/11/2017       Note: Above Lab results reviewed.  Imaging  DG Bone Survey Met CLINICAL DATA:  Multiple myeloma, currently in remission.  EXAM: METASTATIC BONE SURVEY  COMPARISON:  CT scan 12/22/2017  FINDINGS: The lateral skull film demonstrates numerous small lytic myelomatous lesions.  A few small scattered lytic myelomatous lesions  are suspected in the clavicles and proximal humeri bilaterally.  Do not see any obvious lesions involving the spine or ribs. Remote healed rib fractures are noted.  No definite lesions involving the bony pelvis or lower extremities.  IMPRESSION: 1. Suspect small lytic myelomatous lesions involving the skull, clavicles and bilateral proximal humeri. 2. No other definite lesions are identified.  Electronically Signed   By: Marijo Sanes M.D.   On: 10/22/2018 15:51  Assessment  The primary encounter diagnosis was Thoracic radiculitis (Right) (secondary to shingles). Diagnoses of Herpes zoster without complication, Elevated serum creatinine, Multiple myeloma, remission status unspecified (HCC), Spasm of back muscles, Chemotherapy-induced peripheral neuropathy ([place]), Neuropathic pain, and Neurogenic pain were also pertinent to this visit.  Plan of Care  Problem-specific:  No problem-specific Assessment & Plan notes found for this encounter.  Mr. Ysmael Hires. has a current medication list which includes the following long-term medication(s): baclofen, oxycodone, [START ON 09/09/2019] oxycodone, [START ON 10/09/2019] oxycodone, sertraline, venlafaxine xr, and pregabalin.  Pharmacotherapy (Medications Ordered): Meds ordered this encounter  Medications  . pregabalin (LYRICA) 150 MG capsule    Sig: Take 1 capsule (150 mg total) by mouth 3 (three) times daily.    Dispense:  90 capsule    Refill:  5    Fill one day early if pharmacy is closed on scheduled refill date. May substitute for generic, or similar, if available.   Orders:  Orders Placed This Encounter  Procedures  . Thoracic Epidural Injection    Standing Status:   Future    Standing Expiration Date:   09/12/2019    Scheduling Instructions:     Level: TBD     Laterality: Right-sided     Sedation: Patient's choice.     Timeframe: ASAP    Order Specific Question:   Where will this procedure be performed?    Answer:   [facility]  Pain Management   Follow-up plan:   Return for Procedure (w/ sedation): (R) Thoracic ESI #1, (ASAP).      Interventional management options:  Considering:   DiagnosticbilateralLESI Possible bilateral lumbar facet RFA Diagnostic bilateral sacroiliac joint block Possible bilateral sacroiliac joint RFA Diagnostic right knee Hyalgan series Diagnosticright knee intra-articular knee injection Diagnostic left knee genicular nerve block   Palliative PRN treatment(s):   Palliative bilateral IA hip joint injection #2 (50/50/50/50) Palliative bilateral lumbar facet block #3 (100/100/0) (60/60/60) Palliative left piriformis muscle injection #  2 (100/100/0)       Recent Visits Date Type Provider Dept  07/26/19 Telemedicine Milinda Pointer, MD [facility]  Showing recent visits within past 90 days and meeting all other requirements   Today's Visits Date Type Provider Dept  08/16/19 Office Visit Milinda Pointer, MD [facility]  Showing today's visits and meeting all other requirements   Future Appointments Date Type Provider Dept  11/08/19 Appointment Milinda Pointer, MD [facility]  Showing future appointments within next 90 days and meeting all other requirements   I discussed the assessment and treatment plan with the patient. The patient was provided an opportunity to ask questions and all were answered. The patient agreed with the plan and demonstrated an understanding of the instructions.  Patient advised to call back or seek an in-person evaluation if the symptoms or condition worsens.  Duration of encounter: 13 minutes.  Note by: Gaspar Cola, MD Date: 08/16/2019; Time: 8:40 AM

## 2019-08-16 ENCOUNTER — Ambulatory Visit: Payer: No Typology Code available for payment source | Attending: Pain Medicine | Admitting: Pain Medicine

## 2019-08-16 ENCOUNTER — Other Ambulatory Visit: Payer: Self-pay

## 2019-08-16 DIAGNOSIS — M5414 Radiculopathy, thoracic region: Secondary | ICD-10-CM | POA: Diagnosis not present

## 2019-08-16 DIAGNOSIS — T451X5A Adverse effect of antineoplastic and immunosuppressive drugs, initial encounter: Secondary | ICD-10-CM

## 2019-08-16 DIAGNOSIS — R7989 Other specified abnormal findings of blood chemistry: Secondary | ICD-10-CM | POA: Diagnosis not present

## 2019-08-16 DIAGNOSIS — M792 Neuralgia and neuritis, unspecified: Secondary | ICD-10-CM

## 2019-08-16 DIAGNOSIS — C9000 Multiple myeloma not having achieved remission: Secondary | ICD-10-CM | POA: Diagnosis not present

## 2019-08-16 DIAGNOSIS — B029 Zoster without complications: Secondary | ICD-10-CM

## 2019-08-16 DIAGNOSIS — G620 Drug-induced polyneuropathy: Secondary | ICD-10-CM

## 2019-08-16 DIAGNOSIS — M62830 Muscle spasm of back: Secondary | ICD-10-CM

## 2019-08-16 MED ORDER — PREGABALIN 150 MG PO CAPS: 150.0000 mg | ORAL_CAPSULE | Freq: Three times a day (TID) | ORAL | 5 refills | Status: DC

## 2019-08-16 NOTE — Patient Instructions (Signed)
____________________________________________________________________________________________  Preparing for Procedure with Sedation  Procedure appointments are limited to planned procedures: . No Prescription Refills. . No disability issues will be discussed. . No medication changes will be discussed.  Instructions: . Oral Intake: Do not eat or drink anything for at least 3 hours prior to your procedure. (Exception: Blood Pressure Medication. See below.) . Transportation: Unless otherwise stated by your physician, you may drive yourself after the procedure. . Blood Pressure Medicine: Do not forget to take your blood pressure medicine with a sip of water the morning of the procedure. If your Diastolic (lower reading)is above 100 mmHg, elective cases will be cancelled/rescheduled. . Blood thinners: These will need to be stopped for procedures. Notify our staff if you are taking any blood thinners. Depending on which one you take, there will be specific instructions on how and when to stop it. . Diabetics on insulin: Notify the staff so that you can be scheduled 1st case in the morning. If your diabetes requires high dose insulin, take only  of your normal insulin dose the morning of the procedure and notify the staff that you have done so. . Preventing infections: Shower with an antibacterial soap the morning of your procedure. . Build-up your immune system: Take 1000 mg of Vitamin C with every meal (3 times a day) the day prior to your procedure. . Antibiotics: Inform the staff if you have a condition or reason that requires you to take antibiotics before dental procedures. . Pregnancy: If you are pregnant, call and cancel the procedure. . Sickness: If you have a cold, fever, or any active infections, call and cancel the procedure. . Arrival: You must be in the facility at least 30 minutes prior to your scheduled procedure. . Children: Do not bring children with you. . Dress appropriately:  Bring dark clothing that you would not mind if they get stained. . Valuables: Do not bring any jewelry or valuables.  Reasons to call and reschedule or cancel your procedure: (Following these recommendations will minimize the risk of a serious complication.) . Surgeries: Avoid having procedures within 2 weeks of any surgery. (Avoid for 2 weeks before or after any surgery). . Flu Shots: Avoid having procedures within 2 weeks of a flu shots or . (Avoid for 2 weeks before or after immunizations). . Barium: Avoid having a procedure within 7-10 days after having had a radiological study involving the use of radiological contrast. (Myelograms, Barium swallow or enema study). . Heart attacks: Avoid any elective procedures or surgeries for the initial 6 months after a "Myocardial Infarction" (Heart Attack). . Blood thinners: It is imperative that you stop these medications before procedures. Let us know if you if you take any blood thinner.  . Infection: Avoid procedures during or within two weeks of an infection (including chest colds or gastrointestinal problems). Symptoms associated with infections include: Localized redness, fever, chills, night sweats or profuse sweating, burning sensation when voiding, cough, congestion, stuffiness, runny nose, sore throat, diarrhea, nausea, vomiting, cold or Flu symptoms, recent or current infections. It is specially important if the infection is over the area that we intend to treat. . Heart and lung problems: Symptoms that may suggest an active cardiopulmonary problem include: cough, chest pain, breathing difficulties or shortness of breath, dizziness, ankle swelling, uncontrolled high or unusually low blood pressure, and/or palpitations. If you are experiencing any of these symptoms, cancel your procedure and contact your primary care physician for an evaluation.  Remember:  Regular Business hours are:    Monday to Thursday 8:00 AM to 4:00 PM  Provider's  Schedule: Jeri Jeanbaptiste, MD:  Procedure days: Tuesday and Thursday 7:30 AM to 4:00 PM  Bilal Lateef, MD:  Procedure days: Monday and Wednesday 7:30 AM to 4:00 PM ____________________________________________________________________________________________    

## 2019-08-21 ENCOUNTER — Other Ambulatory Visit: Payer: Self-pay | Admitting: Oncology

## 2019-08-21 MED FILL — PREGABALIN 150 MG CAPS: 150 | 30 days supply | Qty: 90 | Fill #0

## 2019-08-21 MED FILL — SERTRALINE HCL 25 MG TABLET: 25 | 30 days supply | Qty: 30 | Fill #0

## 2019-08-23 ENCOUNTER — Other Ambulatory Visit: Payer: Self-pay

## 2019-08-23 ENCOUNTER — Other Ambulatory Visit: Payer: Self-pay | Admitting: *Deleted

## 2019-08-23 ENCOUNTER — Inpatient Hospital Stay: Payer: No Typology Code available for payment source

## 2019-08-23 ENCOUNTER — Inpatient Hospital Stay: Payer: No Typology Code available for payment source | Attending: Oncology

## 2019-08-23 DIAGNOSIS — C9001 Multiple myeloma in remission: Secondary | ICD-10-CM | POA: Insufficient documentation

## 2019-08-23 DIAGNOSIS — Z299 Encounter for prophylactic measures, unspecified: Secondary | ICD-10-CM

## 2019-08-23 DIAGNOSIS — Z9481 Bone marrow transplant status: Secondary | ICD-10-CM

## 2019-08-23 DIAGNOSIS — R7989 Other specified abnormal findings of blood chemistry: Secondary | ICD-10-CM

## 2019-08-23 LAB — CBC WITH DIFFERENTIAL/PLATELET
Abs Immature Granulocytes: 0.01 10*3/uL (ref 0.00–0.07)
Basophils Absolute: 0 10*3/uL (ref 0.0–0.1)
Basophils Relative: 1 %
Eosinophils Absolute: 0.2 10*3/uL (ref 0.0–0.5)
Eosinophils Relative: 5 %
HCT: 42 % (ref 39.0–52.0)
Hemoglobin: 14.5 g/dL (ref 13.0–17.0)
Immature Granulocytes: 0 %
Lymphocytes Relative: 20 %
Lymphs Abs: 0.9 10*3/uL (ref 0.7–4.0)
MCH: 33.4 pg (ref 26.0–34.0)
MCHC: 34.5 g/dL (ref 30.0–36.0)
MCV: 96.8 fL (ref 80.0–100.0)
Monocytes Absolute: 0.3 10*3/uL (ref 0.1–1.0)
Monocytes Relative: 8 %
Neutro Abs: 3 10*3/uL (ref 1.7–7.7)
Neutrophils Relative %: 66 %
Platelets: 162 10*3/uL (ref 150–400)
RBC: 4.34 MIL/uL (ref 4.22–5.81)
RDW: 13.9 % (ref 11.5–15.5)
WBC: 4.6 10*3/uL (ref 4.0–10.5)
nRBC: 0 % (ref 0.0–0.2)

## 2019-08-23 LAB — COMPREHENSIVE METABOLIC PANEL
ALT: 13 U/L (ref 0–44)
AST: 18 U/L (ref 15–41)
Albumin: 3.8 g/dL (ref 3.5–5.0)
Alkaline Phosphatase: 62 U/L (ref 38–126)
Anion gap: 7 (ref 5–15)
BUN: 15 mg/dL (ref 6–20)
CO2: 31 mmol/L (ref 22–32)
Calcium: 8.9 mg/dL (ref 8.9–10.3)
Chloride: 103 mmol/L (ref 98–111)
Creatinine, Ser: 1.63 mg/dL — ABNORMAL HIGH (ref 0.61–1.24)
GFR calc Af Amer: 57 mL/min — ABNORMAL LOW (ref >60)
GFR calc non Af Amer: 49 mL/min — ABNORMAL LOW (ref >60)
Glucose, Bld: 70 mg/dL (ref 70–99)
Potassium: 3.7 mmol/L (ref 3.5–5.1)
Sodium: 141 mmol/L (ref 135–145)
Total Bilirubin: 0.7 mg/dL (ref 0.3–1.2)
Total Protein: 6.3 g/dL — ABNORMAL LOW (ref 6.5–8.1)

## 2019-08-23 MED ORDER — DENOSUMAB 120 MG/1.7ML ~~LOC~~ SOLN
120.0000 mg | Freq: Once | SUBCUTANEOUS | Status: AC
  Administered 2019-08-23: 120 mg via SUBCUTANEOUS
  Filled 2019-08-23: qty 1.7

## 2019-09-02 DIAGNOSIS — L299 Pruritus, unspecified: Secondary | ICD-10-CM

## 2019-09-02 MED ORDER — HYDROXYZINE HCL 10 MG PO TABS: 10.0000 mg | ORAL_TABLET | Freq: Three times a day (TID) | ORAL | 0 refills | Status: DC | PRN

## 2019-09-06 ENCOUNTER — Other Ambulatory Visit: Payer: Self-pay | Admitting: Oncology

## 2019-09-06 DIAGNOSIS — C9001 Multiple myeloma in remission: Secondary | ICD-10-CM

## 2019-09-08 ENCOUNTER — Ambulatory Visit: Payer: No Typology Code available for payment source | Admitting: Primary Care

## 2019-09-10 MED FILL — oxyCODONE HCL 5 MG TABS: 5 | 30 days supply | Qty: 90 | Fill #0

## 2019-09-13 ENCOUNTER — Other Ambulatory Visit: Payer: Self-pay

## 2019-09-13 ENCOUNTER — Encounter: Payer: Self-pay | Admitting: Primary Care

## 2019-09-13 ENCOUNTER — Ambulatory Visit (INDEPENDENT_AMBULATORY_CARE_PROVIDER_SITE_OTHER): Payer: No Typology Code available for payment source | Admitting: Primary Care

## 2019-09-13 VITALS — BP 126/86 | HR 70 | Temp 96.1°F | Ht 67.0 in | Wt 144.0 lb

## 2019-09-13 DIAGNOSIS — L299 Pruritus, unspecified: Secondary | ICD-10-CM | POA: Diagnosis not present

## 2019-09-13 MED ORDER — PREDNISONE 20 MG PO TABS: ORAL_TABLET | ORAL | 0 refills | Status: DC

## 2019-09-13 MED FILL — SERTRALINE HCL 25 MG TABLET: 25 | 30 days supply | Qty: 30 | Fill #0

## 2019-09-13 MED FILL — predniSONE 20 MG TABS: 20 | 8 days supply | Qty: 12 | Fill #0

## 2019-09-13 NOTE — Progress Notes (Signed)
Subjective:    Patient ID: James Box., male    DOB: 04-21-1971, 49 y.o.   MRN: [id number]  HPI  This visit occurred during the SARS-CoV-2 public health emergency.  Safety protocols were in place, including screening questions prior to the visit, additional usage of staff PPE, and extensive cleaning of exam room while observing appropriate contact time as indicated for disinfecting solutions.   James House is a 49 year old male with a history of hypertension, syncope, chemotherapy induced peripheral neuropathy, herpes zoster, osteoarthritis, chronic pain syndrome who presents today with a chief complaint of pruritis.   He was diagnosed and treated for herpes zoster in mid April 2021. Since then he's noticed itching and nerve pain. He is currently managed on Lyrica per pain management, this is helping with pain, but he cannot get rid of the itching.   He's tried hydroxyzine which helped him sleep, but didn't control itching. He is taking Claritin, has tried taking famotidine along with Claritin without improvement. He cannot wear jeans or a tool belt due to irritation.   Review of Systems  Constitutional: Negative for fever.  Respiratory: Negative for wheezing.   Skin:       Scaring to right hip from herpes zoster.       Past Medical History:  Diagnosis Date  . CKD (chronic kidney disease) stage 3, GFR 30-59 ml/min   . Hypertension   . Multiple myeloma ([place]) 08/11/2017  . Neuropathy   . Pneumonia      Social History   Socioeconomic History  . Marital status: Married    Spouse name: [name]  . Number of children: 3  . Years of education: Not on file  . Highest education level: Not on file  Occupational History  . Not on file  Tobacco Use  . Smoking status: Former Smoker    Quit date: 08/19/2013    Years since quitting: 6.0  . Smokeless tobacco: Current User    Types: Snuff  Substance and Sexual Activity  . Alcohol use: No  . Drug use: No  . Sexual activity: Yes    Other Topics Concern  . Not on file  Social History Narrative   Live in private residence with spouse and mother in law   Social Determinants of Health   Financial Resource Strain:   . Difficulty of Paying Living Expenses:   Food Insecurity:   . Worried About Charity fundraiser in the Last Year:   . Arboriculturist in the Last Year:   Transportation Needs:   . Film/video editor (Medical):   Marland Kitchen Lack of Transportation (Non-Medical):   Physical Activity:   . Days of Exercise per Week:   . Minutes of Exercise per Session:   Stress:   . Feeling of Stress :   Social Connections:   . Frequency of Communication with Friends and Family:   . Frequency of Social Gatherings with Friends and Family:   . Attends Religious Services:   . Active Member of Clubs or Organizations:   . Attends Archivist Meetings:   Marland Kitchen Marital Status:   Intimate Partner Violence:   . Fear of Current or Ex-Partner:   . Emotionally Abused:   Marland Kitchen Physically Abused:   . Sexually Abused:     Past Surgical History:  Procedure Laterality Date  . ABDOMINAL SURGERY    . JOINT REPLACEMENT     right knee  . KNEE SURGERY Left  Family History  Problem Relation Age of Onset  . Cancer Mother 49       Pancreatic  . COPD Mother   . Diabetes Mother   . Hyperlipidemia Mother   . Hypertension Mother   . Cancer Maternal Uncle        pancreatic  . Cancer Maternal Grandmother 97       colon  . COPD Maternal Grandmother   . Diabetes Maternal Grandmother   . Hypertension Maternal Grandmother     No Known Allergies  Current Outpatient Medications on File Prior to Visit  Medication Sig Dispense Refill  . aspirin EC 81 MG tablet Take 81 mg by mouth daily.    . baclofen (LIORESAL) 10 MG tablet Take 1-2 tablets (10-20 mg total) by mouth 4 (four) times daily. 240 tablet 5  . hydrOXYzine (ATARAX/VISTARIL) 10 MG tablet Take 1-2 tablets (10-20 mg total) by mouth 3 (three) times daily as needed for  itching. May cause drowsiness. 30 tablet 0  . lenalidomide (REVLIMID) 5 MG capsule TAKE 1 CAPSULE BY MOUTH DAILY 28 capsule 0  . oxyCODONE (OXY IR/ROXICODONE) 5 MG immediate release tablet Take 1 tablet (5 mg total) by mouth every 8 (eight) hours as needed for severe pain. Must last 30 days 90 tablet 0  . [START ON 10/09/2019] oxyCODONE (OXY IR/ROXICODONE) 5 MG immediate release tablet Take 1 tablet (5 mg total) by mouth every 8 (eight) hours as needed for severe pain. Must last 30 days 90 tablet 0  . pregabalin (LYRICA) 150 MG capsule Take 1 capsule (150 mg total) by mouth 3 (three) times daily. 90 capsule 5  . sertraline (ZOLOFT) 25 MG tablet TAKE 1 TABLET (25 MG TOTAL) BY MOUTH DAILY. 30 tablet 1  . oxyCODONE (OXY IR/ROXICODONE) 5 MG immediate release tablet Take 1 tablet (5 mg total) by mouth every 8 (eight) hours as needed for severe pain. Must last 30 days 90 tablet 0   No current facility-administered medications on file prior to visit.    BP 126/86   Pulse 70   Temp (!) 96.1 F (35.6 C) (Temporal)   Ht _0  (1.702 m)   Wt 144 lb (65.3 kg)   SpO2 98%   BMI 22.55 kg/m    Objective:   Physical Exam  Constitutional: He appears well-nourished.  Respiratory: Effort normal.  Neurological: He is alert.  Skin: Skin is warm and dry. No erythema.  Old scarring from herpes zoster to right hip. No erythema or cellulitis.            Assessment & Plan:

## 2019-09-13 NOTE — Assessment & Plan Note (Signed)
Continued from initial herpes zoster diagnosis. No improvement with Claritin and Pepcid, very little with hydroxyzine.   No cellulitis or rash. This is certainly nerve related pruritis. Switch to Zyrtec. Add prednisone course. Add topical capsaicin. He will update in 1 week.

## 2019-09-13 NOTE — Patient Instructions (Signed)
Start prednisone tablets. Take 2 tablets daily for four days, then 1 tablet daily for four days.  Stop Claritin. Start Zyrtec.  Start Capsaicin cream. You can get this at any drugstore.   Please update me in a week as discussed.  It was a pleasure to see you today!

## 2019-09-21 MED FILL — PREGABALIN 150 MG CAPS: 150 | 30 days supply | Qty: 90 | Fill #1

## 2019-09-21 MED FILL — BACLOFEN 10 MG TABS: 10 | 30 days supply | Qty: 240 | Fill #3

## 2019-09-27 ENCOUNTER — Other Ambulatory Visit: Payer: Self-pay

## 2019-09-27 ENCOUNTER — Inpatient Hospital Stay (HOSPITAL_BASED_OUTPATIENT_CLINIC_OR_DEPARTMENT_OTHER): Payer: No Typology Code available for payment source | Admitting: Oncology

## 2019-09-27 ENCOUNTER — Inpatient Hospital Stay: Payer: No Typology Code available for payment source | Attending: Oncology

## 2019-09-27 ENCOUNTER — Inpatient Hospital Stay: Payer: No Typology Code available for payment source

## 2019-09-27 ENCOUNTER — Encounter: Payer: Self-pay | Admitting: Oncology

## 2019-09-27 VITALS — BP 129/83 | HR 68 | Temp 98.0°F | Resp 16 | Wt 143.3 lb

## 2019-09-27 DIAGNOSIS — G620 Drug-induced polyneuropathy: Secondary | ICD-10-CM

## 2019-09-27 DIAGNOSIS — C9001 Multiple myeloma in remission: Secondary | ICD-10-CM | POA: Diagnosis present

## 2019-09-27 DIAGNOSIS — Z79899 Other long term (current) drug therapy: Secondary | ICD-10-CM

## 2019-09-27 DIAGNOSIS — Z299 Encounter for prophylactic measures, unspecified: Secondary | ICD-10-CM

## 2019-09-27 DIAGNOSIS — Z7983 Long term (current) use of bisphosphonates: Secondary | ICD-10-CM

## 2019-09-27 DIAGNOSIS — T451X5A Adverse effect of antineoplastic and immunosuppressive drugs, initial encounter: Secondary | ICD-10-CM

## 2019-09-27 DIAGNOSIS — R7989 Other specified abnormal findings of blood chemistry: Secondary | ICD-10-CM

## 2019-09-27 DIAGNOSIS — Z9481 Bone marrow transplant status: Secondary | ICD-10-CM

## 2019-09-27 LAB — CBC WITH DIFFERENTIAL/PLATELET
Abs Immature Granulocytes: 0.02 10*3/uL (ref 0.00–0.07)
Basophils Absolute: 0 10*3/uL (ref 0.0–0.1)
Basophils Relative: 1 %
Eosinophils Absolute: 0.2 10*3/uL (ref 0.0–0.5)
Eosinophils Relative: 4 %
HCT: 40.9 % (ref 39.0–52.0)
Hemoglobin: 14.4 g/dL (ref 13.0–17.0)
Immature Granulocytes: 0 %
Lymphocytes Relative: 15 %
Lymphs Abs: 0.9 10*3/uL (ref 0.7–4.0)
MCH: 33.8 pg (ref 26.0–34.0)
MCHC: 35.2 g/dL (ref 30.0–36.0)
MCV: 96 fL (ref 80.0–100.0)
Monocytes Absolute: 0.4 10*3/uL (ref 0.1–1.0)
Monocytes Relative: 7 %
Neutro Abs: 4.2 10*3/uL (ref 1.7–7.7)
Neutrophils Relative %: 73 %
Platelets: 169 10*3/uL (ref 150–400)
RBC: 4.26 MIL/uL (ref 4.22–5.81)
RDW: 13.2 % (ref 11.5–15.5)
WBC: 5.8 10*3/uL (ref 4.0–10.5)
nRBC: 0 % (ref 0.0–0.2)

## 2019-09-27 LAB — COMPREHENSIVE METABOLIC PANEL
ALT: 15 U/L (ref 0–44)
AST: 19 U/L (ref 15–41)
Albumin: 3.8 g/dL (ref 3.5–5.0)
Alkaline Phosphatase: 60 U/L (ref 38–126)
Anion gap: 8 (ref 5–15)
BUN: 21 mg/dL — ABNORMAL HIGH (ref 6–20)
CO2: 27 mmol/L (ref 22–32)
Calcium: 8.5 mg/dL — ABNORMAL LOW (ref 8.9–10.3)
Chloride: 103 mmol/L (ref 98–111)
Creatinine, Ser: 1.46 mg/dL — ABNORMAL HIGH (ref 0.61–1.24)
GFR calc Af Amer: >60 mL/min (ref >60)
GFR calc non Af Amer: 56 mL/min — ABNORMAL LOW (ref >60)
Glucose, Bld: 103 mg/dL — ABNORMAL HIGH (ref 70–99)
Potassium: 3.9 mmol/L (ref 3.5–5.1)
Sodium: 138 mmol/L (ref 135–145)
Total Bilirubin: 0.7 mg/dL (ref 0.3–1.2)
Total Protein: 6.4 g/dL — ABNORMAL LOW (ref 6.5–8.1)

## 2019-09-27 MED ORDER — DENOSUMAB 120 MG/1.7ML ~~LOC~~ SOLN
120.0000 mg | Freq: Once | SUBCUTANEOUS | Status: AC
  Administered 2019-09-27: 120 mg via SUBCUTANEOUS
  Filled 2019-09-27: qty 1.7

## 2019-09-27 NOTE — Progress Notes (Signed)
Pt here for f/u. Having back pain worse today with leg pain. Taking revlimid.

## 2019-09-28 LAB — KAPPA/LAMBDA LIGHT CHAINS
Kappa free light chain: 15.2 mg/L (ref 3.3–19.4)
Kappa, lambda light chain ratio: 1.33 (ref 0.26–1.65)
Lambda free light chains: 11.4 mg/L (ref 5.7–26.3)

## 2019-09-29 LAB — MULTIPLE MYELOMA PANEL, SERUM
Albumin SerPl Elph-Mcnc: 3.3 g/dL (ref 2.9–4.4)
Albumin/Glob SerPl: 1.4 (ref 0.7–1.7)
Alpha 1: 0.2 g/dL (ref 0.0–0.4)
Alpha2 Glob SerPl Elph-Mcnc: 0.6 g/dL (ref 0.4–1.0)
B-Globulin SerPl Elph-Mcnc: 0.9 g/dL (ref 0.7–1.3)
Gamma Glob SerPl Elph-Mcnc: 0.7 g/dL (ref 0.4–1.8)
Globulin, Total: 2.5 g/dL (ref 2.2–3.9)
IgA: 156 mg/dL (ref 90–386)
IgG (Immunoglobin G), Serum: 752 mg/dL (ref 603–1613)
IgM (Immunoglobulin M), Srm: 59 mg/dL (ref 20–172)
Total Protein ELP: 5.8 g/dL — ABNORMAL LOW (ref 6.0–8.5)

## 2019-09-29 NOTE — Progress Notes (Signed)
Hematology/Oncology Consult note [facility]  [phone] [phone]  Patient Care Team: Pleas Koch, NP as PCP - General (Internal Medicine) Vella Redhead, MD as PCP - Hematology/Oncology Sindy Guadeloupe, MD as Consulting Physician (Hematology and Oncology)   Name of the patient: James House  [id number]  23-Jan-1971   Date of visit: 09/29/19  Diagnosis-  light chain kappa multiple myeloma s/p ASCT currently in remission. Patient on maintenance revlimid  Chief complaint/ Reason for visit-routine follow-up of multiple myeloma on maintenance Revlimid  Heme/Onc history: Patient is a 49 year old male with a history of kappa light chain myeloma that was treated by [name]. History is as follows:  1. Patient presented with renal insufficiency with a creatinine of 2.4 in April 2018. Kidney biopsy showed myeloma cast nephropathy. Kappa light chain was 3004 and lambda 0.22 with a kappa lambda ratio of 13,000 655. Skeletal survey showed multiple calvarial and marrow lesions. Bone marrow biopsy in May 2018 showed 43% plasma cells. He received CyBorDfor cycle 1 in May 2018 and was subsequently switched to RVD. He received 4 cycles followed by a repeat bone marrow biopsy in August 2018 which showed no increase in plasma cells.  2.He underwent autologous stem cell transplantation on 01/30/2017. He was started on Revlimid maintenance 10 mg daily in January 2019.  3.Labs from January February and March 2019 done at [facility] showed no M spike, normal kappa lambda ratio of 1.06. He was last seen by them in April 2019.  Following that patient was admitted to [facility] for healthcare associated pneumonia and was recently discharged. He wished to transfer his care to [place] at this time.  With regards to his myeloma patient is currently on 10 mg of Revlimid daily. He is also on acyclovir 400 mg twice daily which he is to continue for a  minimum of 1 year post transplant. Patient has chronic back pain as well as chemo-induced peripheral neuropathy for which he was seen by [name] in the past and is currently seeing [facility] pain clinic and is under pain contract with them   Interval history-continues to have neuropathic pain in his extremities for which she is seeing pain clinic.  Has ongoing fatigue.  Denies other complaints  ECOG PS- 1 Pain scale- 0   Review of systems- Review of Systems  Constitutional: Positive for malaise/fatigue. Negative for chills, fever and weight loss.  HENT: Negative for congestion, ear discharge and nosebleeds.   Eyes: Negative for blurred vision.  Respiratory: Negative for cough, hemoptysis, sputum production, shortness of breath and wheezing.   Cardiovascular: Negative for chest pain, palpitations, orthopnea and claudication.  Gastrointestinal: Negative for abdominal pain, blood in stool, constipation, diarrhea, heartburn, melena, nausea and vomiting.  Genitourinary: Negative for dysuria, flank pain, frequency, hematuria and urgency.  Musculoskeletal: Positive for back pain. Negative for joint pain and myalgias.  Skin: Negative for rash.  Neurological: Positive for sensory change (Peripheral neuropathy). Negative for dizziness, tingling, focal weakness, seizures, weakness and headaches.  Endo/Heme/Allergies: Does not bruise/bleed easily.  Psychiatric/Behavioral: Negative for depression and suicidal ideas. The patient does not have insomnia.       No Known Allergies   Past Medical History:  Diagnosis Date  . CKD (chronic kidney disease) stage 3, GFR 30-59 ml/min   . Hypertension   . Multiple myeloma ([place]) 08/11/2017  . Neuropathy   . Pneumonia      Past Surgical History:  Procedure Laterality Date  . ABDOMINAL  SURGERY    . JOINT REPLACEMENT     right knee  . KNEE SURGERY Left     Social History   Socioeconomic History  . Marital status: Married    Spouse name:  [name]  . Number of children: 3  . Years of education: Not on file  . Highest education level: Not on file  Occupational History  . Not on file  Tobacco Use  . Smoking status: Former Smoker    Quit date: 08/19/2013    Years since quitting: 6.1  . Smokeless tobacco: Current User    Types: Snuff  Substance and Sexual Activity  . Alcohol use: No  . Drug use: No  . Sexual activity: Yes  Other Topics Concern  . Not on file  Social History Narrative   Live in private residence with spouse and mother in law   Social Determinants of Health   Financial Resource Strain:   . Difficulty of Paying Living Expenses:   Food Insecurity:   . Worried About Charity fundraiser in the Last Year:   . Arboriculturist in the Last Year:   Transportation Needs:   . Film/video editor (Medical):   Marland Kitchen Lack of Transportation (Non-Medical):   Physical Activity:   . Days of Exercise per Week:   . Minutes of Exercise per Session:   Stress:   . Feeling of Stress :   Social Connections:   . Frequency of Communication with Friends and Family:   . Frequency of Social Gatherings with Friends and Family:   . Attends Religious Services:   . Active Member of Clubs or Organizations:   . Attends Archivist Meetings:   Marland Kitchen Marital Status:   Intimate Partner Violence:   . Fear of Current or Ex-Partner:   . Emotionally Abused:   Marland Kitchen Physically Abused:   . Sexually Abused:     Family History  Problem Relation Age of Onset  . Cancer Mother 68       Pancreatic  . COPD Mother   . Diabetes Mother   . Hyperlipidemia Mother   . Hypertension Mother   . Cancer Maternal Uncle        pancreatic  . Cancer Maternal Grandmother 44       colon  . COPD Maternal Grandmother   . Diabetes Maternal Grandmother   . Hypertension Maternal Grandmother      Current Outpatient Medications:  .  aspirin EC 81 MG tablet, Take 81 mg by mouth daily., Disp: , Rfl:  .  baclofen (LIORESAL) 10 MG tablet, Take 1-2  tablets (10-20 mg total) by mouth 4 (four) times daily., Disp: 240 tablet, Rfl: 5 .  hydrOXYzine (ATARAX/VISTARIL) 10 MG tablet, Take 1-2 tablets (10-20 mg total) by mouth 3 (three) times daily as needed for itching. May cause drowsiness., Disp: 30 tablet, Rfl: 0 .  lenalidomide (REVLIMID) 5 MG capsule, TAKE 1 CAPSULE BY MOUTH DAILY, Disp: 28 capsule, Rfl: 0 .  oxyCODONE (OXY IR/ROXICODONE) 5 MG immediate release tablet, Take 1 tablet (5 mg total) by mouth every 8 (eight) hours as needed for severe pain. Must last 30 days, Disp: 90 tablet, Rfl: 0 .  pregabalin (LYRICA) 150 MG capsule, Take 1 capsule (150 mg total) by mouth 3 (three) times daily., Disp: 90 capsule, Rfl: 5 .  sertraline (ZOLOFT) 25 MG tablet, TAKE 1 TABLET (25 MG TOTAL) BY MOUTH DAILY., Disp: 30 tablet, Rfl: 1 .  oxyCODONE (OXY IR/ROXICODONE) 5 MG immediate  release tablet, Take 1 tablet (5 mg total) by mouth every 8 (eight) hours as needed for severe pain. Must last 30 days, Disp: 90 tablet, Rfl: 0 .  [START ON 10/09/2019] oxyCODONE (OXY IR/ROXICODONE) 5 MG immediate release tablet, Take 1 tablet (5 mg total) by mouth every 8 (eight) hours as needed for severe pain. Must last 30 days (Patient not taking: Reported on 09/27/2019), Disp: 90 tablet, Rfl: 0  Physical exam:  Vitals:   09/27/19 1500  BP: 129/83  Pulse: 68  Resp: 16  Temp: 98 F (36.7 C)  TempSrc: Tympanic  Weight: 143 lb 4.8 oz (65 kg)   Physical Exam Constitutional:      General: He is not in acute distress. Cardiovascular:     Rate and Rhythm: Normal rate and regular rhythm.     Heart sounds: Normal heart sounds.  Pulmonary:     Effort: Pulmonary effort is normal.     Breath sounds: Normal breath sounds.  Abdominal:     General: Bowel sounds are normal.     Palpations: Abdomen is soft.  Skin:    General: Skin is warm and dry.  Neurological:     Mental Status: He is alert and oriented to person, place, and time.      CMP Latest Ref Rng & Units 09/27/2019    Glucose 70 - 99 mg/dL 103(H)  BUN 6 - 20 mg/dL 21(H)  Creatinine 0.61 - 1.24 mg/dL 1.46(H)  Sodium 135 - 145 mmol/L 138  Potassium 3.5 - 5.1 mmol/L 3.9  Chloride 98 - 111 mmol/L 103  CO2 22 - 32 mmol/L 27  Calcium 8.9 - 10.3 mg/dL 8.5(L)  Total Protein 6.5 - 8.1 g/dL 6.4(L)  Total Bilirubin 0.3 - 1.2 mg/dL 0.7  Alkaline Phos 38 - 126 U/L 60  AST 15 - 41 U/L 19  ALT 0 - 44 U/L 15   CBC Latest Ref Rng & Units 09/27/2019  WBC 4.0 - 10.5 K/uL 5.8  Hemoglobin 13.0 - 17.0 g/dL 14.4  Hematocrit 39.0 - 52.0 % 40.9  Platelets 150 - 400 K/uL 169      Assessment and plan- Patient is a 49 y.o. male with history of light chain multiple myeloma currently on maintenance Revlimid s/p autologous stem cell transplant here for routine follow-up  Patient is currently on 5 mg Revlimid maintenance which is tolerating well without any significant side effects.  CBC with differential is normal and CMP shows stable CKD with a creatinine of 1.5.  LFTs are within normal limits.  Counts acceptable to receive Xgeva today.  Prior myeloma panel has not shown any M protein and serum free light chain ratio remains normal.  He is therefore in remission at this time.  Will consider getting another bone survey at next visit  Chemo-induced peripheral neuropathy: Continue oxycodone and Lyrica.  He sees pain clinic for this.  He will continue to get monthly Xgeva and I will see him back in 3 months   Visit Diagnosis 1. Multiple myeloma in remission ([place])   2. Chemotherapy-induced peripheral neuropathy ([place])   3. High risk medication use   4. Long term (current) use of bisphosphonates      Dr. Randa Evens, MD, MPH [facility] [phone] 09/29/2019 10:58 AM

## 2019-10-11 ENCOUNTER — Other Ambulatory Visit: Payer: Self-pay | Admitting: Oncology

## 2019-10-11 DIAGNOSIS — C9001 Multiple myeloma in remission: Secondary | ICD-10-CM

## 2019-10-11 MED FILL — oxyCODONE HCL 5 MG TABS: 5 | 30 days supply | Qty: 90 | Fill #0

## 2019-10-15 MED FILL — SERTRALINE HCL 25 MG TABLET: 25 | 30 days supply | Qty: 30 | Fill #1

## 2019-10-19 MED FILL — PREGABALIN 150 MG CAPS: 150 | 30 days supply | Qty: 90 | Fill #2

## 2019-10-27 ENCOUNTER — Inpatient Hospital Stay: Payer: No Typology Code available for payment source

## 2019-10-29 ENCOUNTER — Inpatient Hospital Stay: Payer: No Typology Code available for payment source | Attending: Oncology

## 2019-10-29 ENCOUNTER — Other Ambulatory Visit: Payer: Self-pay

## 2019-10-29 ENCOUNTER — Inpatient Hospital Stay: Payer: No Typology Code available for payment source

## 2019-10-29 DIAGNOSIS — C9001 Multiple myeloma in remission: Secondary | ICD-10-CM

## 2019-10-29 DIAGNOSIS — Z299 Encounter for prophylactic measures, unspecified: Secondary | ICD-10-CM

## 2019-10-29 DIAGNOSIS — R7989 Other specified abnormal findings of blood chemistry: Secondary | ICD-10-CM

## 2019-10-29 DIAGNOSIS — Z9481 Bone marrow transplant status: Secondary | ICD-10-CM

## 2019-10-29 LAB — BASIC METABOLIC PANEL
Anion gap: 7 (ref 5–15)
BUN: 19 mg/dL (ref 6–20)
CO2: 30 mmol/L (ref 22–32)
Calcium: 8.4 mg/dL — ABNORMAL LOW (ref 8.9–10.3)
Chloride: 102 mmol/L (ref 98–111)
Creatinine, Ser: 1.66 mg/dL — ABNORMAL HIGH (ref 0.61–1.24)
GFR calc Af Amer: 56 mL/min — ABNORMAL LOW (ref >60)
GFR calc non Af Amer: 48 mL/min — ABNORMAL LOW (ref >60)
Glucose, Bld: 92 mg/dL (ref 70–99)
Potassium: 4 mmol/L (ref 3.5–5.1)
Sodium: 139 mmol/L (ref 135–145)

## 2019-10-29 MED ORDER — DENOSUMAB 120 MG/1.7ML ~~LOC~~ SOLN
120.0000 mg | Freq: Once | SUBCUTANEOUS | Status: AC
  Administered 2019-10-29: 120 mg via SUBCUTANEOUS
  Filled 2019-10-29: qty 1.7

## 2019-11-07 NOTE — Progress Notes (Signed)
Patient: James House.  Service Category: E/M  Provider: Gaspar Cola, MD  DOB: 02/04/1971  DOS: 11/08/2019  Location: Office  MRN: [id number]  Setting: Ambulatory outpatient  Referring Provider: Pleas Koch, NP  Type: Established Patient  Specialty: Interventional Pain Management  PCP: Pleas Koch, NP  Location: Remote location  Delivery: TeleHealth     Virtual Encounter - Pain Management PROVIDER NOTE: Information contained herein reflects review and annotations entered in association with encounter. Interpretation of such information and data should be left to medically-trained personnel. Information provided to patient can be located elsewhere in the medical record under "Patient Instructions". Document created using STT-dictation technology, any transcriptional errors that may result from process are unintentional.    Contact & Pharmacy Preferred: [phone] Home: [phone] (home) Mobile: [phone] (mobile) E-mail: [email or web address]  [place], [place] [address] Phone: [phone] Fax: [phone]  [facility] - [place], [place] [address] Phone: [phone] Fax: [phone]   Pre-screening  James House offered "in-person" vs "virtual" encounter. He indicated preferring virtual for this encounter.   Reason COVID-19*  Social distancing based on CDC and AMA recommendations.   I contacted James House. on 11/08/2019 via telephone.      I clearly identified myself as Gaspar Cola, MD. I verified that I was speaking with the correct person using two identifiers (Name: James House., and date of birth: [birth year]).  Consent I sought verbal advanced consent from James House. for virtual visit interactions. I informed James House of possible security and privacy concerns, risks, and limitations  associated with providing "not-in-person" medical evaluation and management services. I also informed James House of the availability of "in-person" appointments. Finally, I informed him that there would be a charge for the virtual visit and that he could be  personally, fully or partially, financially responsible for it. James House expressed understanding and agreed to proceed.   Historic Elements   James House. is a 49 y.o. year old, male patient evaluated today after his last contact with our practice on 08/16/2019. James House  has a past medical history of CKD (chronic kidney disease) stage 3, GFR 30-59 ml/min, Hypertension, Multiple myeloma ([place]) (08/11/2017), Neuropathy, and Pneumonia. He also  has a past surgical history that includes Joint replacement; Knee surgery (Left); and Abdominal surgery. James House has a current medication list which includes the following prescription(s): aspirin ec, baclofen, hydroxyzine, lenalidomide, oxycodone, [START ON 12/08/2019] oxycodone, [START ON 01/07/2020] oxycodone, pregabalin, and sertraline. He  reports that he quit smoking about 6 years ago. His smokeless tobacco use includes snuff. He reports that he does not drink alcohol and does not use drugs. James House has No Known Allergies.   HPI  Today, he is being contacted for medication management.  Pharmacotherapy Assessment  Analgesic: Oxycodone IR 5 mg, 1 tab PO BID (10 mg/day of oxycodone) MME/day: 15 mg/day.   Monitoring: [place] PMP: PDMP reviewed during this encounter.       Pharmacotherapy: No side-effects or adverse reactions reported. Compliance: No problems identified. Effectiveness: Clinically acceptable. Plan: Refer to "POC".  UDS:  Summary  Date Value Ref Range Status  04/02/2018 FINAL  Final    Comment:    ==================================================================== TOXASSURE SELECT 13 (MW) ==================================================================== Test  Result       Flag       Units Drug Present and Declared for Prescription Verification   Oxycodone                      138          EXPECTED   ng/mg creat   Oxymorphone                    52           EXPECTED   ng/mg creat   Noroxycodone                   135          EXPECTED   ng/mg creat    Sources of oxycodone include scheduled prescription medications.    Oxymorphone and noroxycodone are expected metabolites of    oxycodone. Oxymorphone is also available as a scheduled    prescription medication. ==================================================================== Test                      Result    Flag   Units      Ref Range   Creatinine              168              mg/dL      >=20 ==================================================================== Declared Medications:  The flagging and interpretation on this report are based on the  following declared medications.  Unexpected results may arise from  inaccuracies in the declared medications.  **Note: The testing scope of this panel includes these medications:  Oxycodone  **Note: The testing scope of this panel does not include following  reported medications:  Acetaminophen  Acyclovir  Amoxicillin (Augmentin)  Aspirin (Aspirin 81)  Baclofen  Clavulinate (Augmentin)  Gabapentin  Lenalidomide  Meloxicam  Pantoprazole  Prednisone  Pregabalin  Promethazine  Vitamin D2 (Ergocalciferol) ==================================================================== For clinical consultation, please call [phone]. ====================================================================     Laboratory Chemistry Profile   Renal Lab Results  Component Value Date   BUN 19 10/29/2019   CREATININE 1.66 (H) 10/29/2019   BCR 7 (L) 11/26/2017   GFRAA 56 (L) 10/29/2019   GFRNONAA 48 (L) 10/29/2019     Hepatic Lab Results  Component Value Date   AST 19 09/27/2019   ALT 15 09/27/2019   ALBUMIN 3.8 09/27/2019    ALKPHOS 60 09/27/2019   LIPASE 52 (H) 12/22/2017     Electrolytes Lab Results  Component Value Date   NA 139 10/29/2019   K 4.0 10/29/2019   CL 102 10/29/2019   CALCIUM 8.4 (L) 10/29/2019   MG 2.4 (H) 11/26/2017     Bone Lab Results  Component Value Date   25OHVITD1 41 11/26/2017   25OHVITD2 4.4 11/26/2017   25OHVITD3 37 11/26/2017   TESTOSTERONE 570 12/29/2017     Inflammation (CRP: Acute Phase) (ESR: Chronic Phase) Lab Results  Component Value Date   CRP 9 11/26/2017   ESRSEDRATE 16 (H) 11/26/2017   LATICACIDVEN 1.1 08/11/2017       Note: Above Lab results reviewed.   Imaging  DG Bone Survey Met CLINICAL DATA:  Multiple myeloma, currently in remission.  EXAM: METASTATIC BONE SURVEY  COMPARISON:  CT scan 12/22/2017  FINDINGS: The lateral skull film demonstrates numerous small lytic myelomatous lesions.  A few small scattered lytic myelomatous lesions are suspected in the clavicles and proximal humeri bilaterally.  Do not see any obvious lesions involving the spine or ribs. Remote healed rib fractures are noted.  No definite lesions involving the bony pelvis or lower extremities.  IMPRESSION: 1. Suspect small lytic myelomatous lesions involving the skull, clavicles and bilateral proximal humeri. 2. No other definite lesions are identified.  Electronically Signed   By: Marijo Sanes M.D.   On: 10/22/2018 15:51  Assessment  The primary encounter diagnosis was Chronic pain syndrome. Diagnoses of Chronic low back pain (Primary Area of Pain) (Bilateral) (L>R) w/o sciatica, Multiple myeloma, remission status unspecified (HCC), DDD (degenerative disc disease), thoracic, Non-traumatic compression fracture of T7 thoracic vertebra, sequela, Thoracic radiculitis (Right), and Pharmacologic therapy were also pertinent to this visit.  Plan of Care  Problem-specific:  No problem-specific Assessment & Plan notes found for this encounter.  Mr. Kyllian Clingerman. has  a current medication list which includes the following long-term medication(s): baclofen, oxycodone, [START ON 12/08/2019] oxycodone, [START ON 01/07/2020] oxycodone, pregabalin, and sertraline.  Pharmacotherapy (Medications Ordered): Meds ordered this encounter  Medications  . oxyCODONE (OXY IR/ROXICODONE) 5 MG immediate release tablet    Sig: Take 1 tablet (5 mg total) by mouth every 8 (eight) hours as needed for severe pain. Must last 30 days    Dispense:  90 tablet    Refill:  0    Chronic Pain: STOP Act (Not applicable) Fill 1 day early if closed on refill date. Do not fill until: 11/08/2019. To last until: 12/08/2019. Avoid benzodiazepines within 8 hours of opioids  . oxyCODONE (OXY IR/ROXICODONE) 5 MG immediate release tablet    Sig: Take 1 tablet (5 mg total) by mouth every 8 (eight) hours as needed for severe pain. Must last 30 days    Dispense:  90 tablet    Refill:  0    Chronic Pain: STOP Act (Not applicable) Fill 1 day early if closed on refill date. Do not fill until: 12/08/2019. To last until: 01/07/2020. Avoid benzodiazepines within 8 hours of opioids  . oxyCODONE (OXY IR/ROXICODONE) 5 MG immediate release tablet    Sig: Take 1 tablet (5 mg total) by mouth every 8 (eight) hours as needed for severe pain. Must last 30 days    Dispense:  90 tablet    Refill:  0    Chronic Pain: STOP Act (Not applicable) Fill 1 day early if closed on refill date. Do not fill until: 01/07/2020. To last until: 02/06/2020. Avoid benzodiazepines within 8 hours of opioids   Orders:  Orders Placed This Encounter  Procedures  . Thoracic Epidural Injection    Standing Status:   Future    Standing Expiration Date:   12/09/2019    Scheduling Instructions:     Level: T7-8     Laterality: Right-sided     Sedation: Patient's choice.     Timeframe: ASAA    Order Specific Question:   Where will this procedure be performed?    Answer:   [facility]  . ToxASSURE Select 13 (MW), Urine    Volume: 30  ml(s). Minimum 3 ml of urine is needed. Document temperature of fresh sample. Indications: Long term (current) use of opiate analgesic (A12.878)    Order Specific Question:   Release to patient    Answer:   Immediate   Follow-up plan:   Return in about 3 months (around 02/02/2020) for Procedure (w/ sedation): (R) T7-8 TESI #1, (ASAP).      Interventional management options:  Considering:   DiagnosticbilateralLESI Possible  bilateral lumbar facet RFA Diagnostic bilateral sacroiliac joint block Possible bilateral sacroiliac joint RFA Diagnostic right knee Hyalgan series Diagnosticright knee intra-articular knee injection Diagnostic left knee genicular nerve block   Palliative PRN treatment(s):   Palliative bilateral IA hip joint injection #2 (50/50/50/50) Palliative bilateral lumbar facet block #3 (100/100/0) (60/60/60) Palliative left piriformis muscle injection #2 (100/100/0)        Recent Visits Date Type Provider Dept  08/16/19 Office Visit Milinda Pointer, MD [facility]  Showing recent visits within past 90 days and meeting all other requirements Today's Visits Date Type Provider Dept  11/08/19 Telemedicine Milinda Pointer, MD [facility]  Showing today's visits and meeting all other requirements Future Appointments No visits were found meeting these conditions. Showing future appointments within next 90 days and meeting all other requirements  I discussed the assessment and treatment plan with the patient. The patient was provided an opportunity to ask questions and all were answered. The patient agreed with the plan and demonstrated an understanding of the instructions.  Patient advised to call back or seek an in-person evaluation if the symptoms or condition worsens.  Duration of encounter: 14 minutes.  Note by: Gaspar Cola, MD Date: 11/08/2019; Time: 9:55 AM

## 2019-11-08 ENCOUNTER — Ambulatory Visit: Payer: No Typology Code available for payment source | Attending: Pain Medicine | Admitting: Pain Medicine

## 2019-11-08 ENCOUNTER — Other Ambulatory Visit: Payer: Self-pay | Admitting: Oncology

## 2019-11-08 ENCOUNTER — Other Ambulatory Visit: Payer: Self-pay

## 2019-11-08 DIAGNOSIS — Z79899 Other long term (current) drug therapy: Secondary | ICD-10-CM

## 2019-11-08 DIAGNOSIS — G894 Chronic pain syndrome: Secondary | ICD-10-CM

## 2019-11-08 DIAGNOSIS — C9001 Multiple myeloma in remission: Secondary | ICD-10-CM

## 2019-11-08 DIAGNOSIS — M5134 Other intervertebral disc degeneration, thoracic region: Secondary | ICD-10-CM | POA: Insufficient documentation

## 2019-11-08 DIAGNOSIS — C9000 Multiple myeloma not having achieved remission: Secondary | ICD-10-CM

## 2019-11-08 DIAGNOSIS — M4854XS Collapsed vertebra, not elsewhere classified, thoracic region, sequela of fracture: Secondary | ICD-10-CM

## 2019-11-08 DIAGNOSIS — M545 Low back pain: Secondary | ICD-10-CM

## 2019-11-08 DIAGNOSIS — G8929 Other chronic pain: Secondary | ICD-10-CM

## 2019-11-08 DIAGNOSIS — M5414 Radiculopathy, thoracic region: Secondary | ICD-10-CM

## 2019-11-08 MED ORDER — OXYCODONE HCL 5 MG PO TABS: 5.0000 mg | ORAL_TABLET | Freq: Three times a day (TID) | ORAL | 0 refills | Status: DC | PRN

## 2019-11-08 NOTE — Patient Instructions (Signed)
____________________________________________________________________________________________  Preparing for Procedure with Sedation  Procedure appointments are limited to planned procedures: . No Prescription Refills. . No disability issues will be discussed. . No medication changes will be discussed.  Instructions: . Oral Intake: Do not eat or drink anything for at least 8 hours prior to your procedure. (Exception: Blood Pressure Medication. See below.) . Transportation: Unless otherwise stated by your physician, you may drive yourself after the procedure. . Blood Pressure Medicine: Do not forget to take your blood pressure medicine with a sip of water the morning of the procedure. If your Diastolic (lower reading)is above 100 mmHg, elective cases will be cancelled/rescheduled. . Blood thinners: These will need to be stopped for procedures. Notify our staff if you are taking any blood thinners. Depending on which one you take, there will be specific instructions on how and when to stop it. . Diabetics on insulin: Notify the staff so that you can be scheduled 1st case in the morning. If your diabetes requires high dose insulin, take only  of your normal insulin dose the morning of the procedure and notify the staff that you have done so. . Preventing infections: Shower with an antibacterial soap the morning of your procedure. . Build-up your immune system: Take 1000 mg of Vitamin C with every meal (3 times a day) the day prior to your procedure. . Antibiotics: Inform the staff if you have a condition or reason that requires you to take antibiotics before dental procedures. . Pregnancy: If you are pregnant, call and cancel the procedure. . Sickness: If you have a cold, fever, or any active infections, call and cancel the procedure. . Arrival: You must be in the facility at least 30 minutes prior to your scheduled procedure. . Children: Do not bring children with you. . Dress appropriately:  Bring dark clothing that you would not mind if they get stained. . Valuables: Do not bring any jewelry or valuables.  Reasons to call and reschedule or cancel your procedure: (Following these recommendations will minimize the risk of a serious complication.) . Surgeries: Avoid having procedures within 2 weeks of any surgery. (Avoid for 2 weeks before or after any surgery). . Flu Shots: Avoid having procedures within 2 weeks of a flu shots or . (Avoid for 2 weeks before or after immunizations). . Barium: Avoid having a procedure within 7-10 days after having had a radiological study involving the use of radiological contrast. (Myelograms, Barium swallow or enema study). . Heart attacks: Avoid any elective procedures or surgeries for the initial 6 months after a "Myocardial Infarction" (Heart Attack). . Blood thinners: It is imperative that you stop these medications before procedures. Let us know if you if you take any blood thinner.  . Infection: Avoid procedures during or within two weeks of an infection (including chest colds or gastrointestinal problems). Symptoms associated with infections include: Localized redness, fever, chills, night sweats or profuse sweating, burning sensation when voiding, cough, congestion, stuffiness, runny nose, sore throat, diarrhea, nausea, vomiting, cold or Flu symptoms, recent or current infections. It is specially important if the infection is over the area that we intend to treat. . Heart and lung problems: Symptoms that may suggest an active cardiopulmonary problem include: cough, chest pain, breathing difficulties or shortness of breath, dizziness, ankle swelling, uncontrolled high or unusually low blood pressure, and/or palpitations. If you are experiencing any of these symptoms, cancel your procedure and contact your primary care physician for an evaluation.  Remember:  Regular Business hours are:    Monday to Thursday 8:00 AM to 4:00 PM  Provider's  Schedule: Marriah Sanderlin, MD:  Procedure days: Tuesday and Thursday 7:30 AM to 4:00 PM  Bilal Lateef, MD:  Procedure days: Monday and Wednesday 7:30 AM to 4:00 PM ____________________________________________________________________________________________    

## 2019-11-10 ENCOUNTER — Telehealth: Payer: Self-pay | Admitting: *Deleted

## 2019-11-10 MED FILL — oxyCODONE HCL 5 MG TABS: 5 | 30 days supply | Qty: 90 | Fill #0

## 2019-11-10 NOTE — Telephone Encounter (Signed)
I called [facility] and spoke to [place] and let her know that MD signed for refill of revlimid but did not get rems # and when I tried it was too early to do in [facility] system. I got the REMS # and it is [id number]. She will send the number to pharmacy to get it working for pt

## 2019-11-15 MED FILL — BACLOFEN 10 MG TABS: 10 | 30 days supply | Qty: 240 | Fill #4

## 2019-11-16 MED FILL — PREGABALIN 150 MG CAPS: 150 | 30 days supply | Qty: 90 | Fill #3

## 2019-11-19 ENCOUNTER — Other Ambulatory Visit: Payer: Self-pay | Admitting: Oncology

## 2019-11-19 ENCOUNTER — Telehealth: Payer: Self-pay | Admitting: *Deleted

## 2019-11-19 MED ORDER — SERTRALINE HCL 25 MG PO TABS: ORAL_TABLET | ORAL | 2 refills | Status: DC

## 2019-11-19 MED FILL — SERTRALINE HCL 25 MG TABLET: 25 | 30 days supply | Qty: 30 | Fill #0

## 2019-11-19 NOTE — Telephone Encounter (Signed)
Pt called and needs refill zoloft. Dr Janese Banks is not at work today. Spoke to NP jennifer and she will refill.I told pt I would send it to his pharmacy

## 2019-11-24 ENCOUNTER — Inpatient Hospital Stay: Payer: No Typology Code available for payment source

## 2019-12-01 ENCOUNTER — Other Ambulatory Visit: Payer: Self-pay

## 2019-12-01 ENCOUNTER — Inpatient Hospital Stay: Payer: No Typology Code available for payment source | Attending: Oncology

## 2019-12-01 ENCOUNTER — Inpatient Hospital Stay: Payer: No Typology Code available for payment source

## 2019-12-01 DIAGNOSIS — C9001 Multiple myeloma in remission: Secondary | ICD-10-CM | POA: Insufficient documentation

## 2019-12-01 DIAGNOSIS — Z9481 Bone marrow transplant status: Secondary | ICD-10-CM

## 2019-12-01 DIAGNOSIS — Z299 Encounter for prophylactic measures, unspecified: Secondary | ICD-10-CM

## 2019-12-01 DIAGNOSIS — R7989 Other specified abnormal findings of blood chemistry: Secondary | ICD-10-CM

## 2019-12-01 LAB — COMPREHENSIVE METABOLIC PANEL
ALT: 19 U/L (ref 0–44)
AST: 27 U/L (ref 15–41)
Albumin: 3.9 g/dL (ref 3.5–5.0)
Alkaline Phosphatase: 68 U/L (ref 38–126)
Anion gap: 9 (ref 5–15)
BUN: 22 mg/dL — ABNORMAL HIGH (ref 6–20)
CO2: 26 mmol/L (ref 22–32)
Calcium: 8.6 mg/dL — ABNORMAL LOW (ref 8.9–10.3)
Chloride: 103 mmol/L (ref 98–111)
Creatinine, Ser: 1.48 mg/dL — ABNORMAL HIGH (ref 0.61–1.24)
GFR calc Af Amer: >60 mL/min (ref >60)
GFR calc non Af Amer: 55 mL/min — ABNORMAL LOW (ref >60)
Glucose, Bld: 129 mg/dL — ABNORMAL HIGH (ref 70–99)
Potassium: 3.8 mmol/L (ref 3.5–5.1)
Sodium: 138 mmol/L (ref 135–145)
Total Bilirubin: 0.6 mg/dL (ref 0.3–1.2)
Total Protein: 6.6 g/dL (ref 6.5–8.1)

## 2019-12-01 MED ORDER — DENOSUMAB 120 MG/1.7ML ~~LOC~~ SOLN
120.0000 mg | Freq: Once | SUBCUTANEOUS | Status: AC
  Administered 2019-12-01: 120 mg via SUBCUTANEOUS
  Filled 2019-12-01: qty 1.7

## 2019-12-11 MED FILL — oxyCODONE HCL 5 MG TABS: 5 | 30 days supply | Qty: 90 | Fill #0

## 2019-12-15 MED FILL — PREGABALIN 150 MG CAPS: 150 | 30 days supply | Qty: 90 | Fill #4

## 2019-12-20 ENCOUNTER — Other Ambulatory Visit: Payer: Self-pay | Admitting: Oncology

## 2019-12-20 DIAGNOSIS — C9001 Multiple myeloma in remission: Secondary | ICD-10-CM

## 2019-12-22 MED FILL — SERTRALINE HCL 25 MG TABLET: 25 | 30 days supply | Qty: 30 | Fill #1

## 2019-12-31 ENCOUNTER — Other Ambulatory Visit: Payer: Self-pay

## 2019-12-31 ENCOUNTER — Encounter: Payer: Self-pay | Admitting: Oncology

## 2019-12-31 ENCOUNTER — Inpatient Hospital Stay (HOSPITAL_BASED_OUTPATIENT_CLINIC_OR_DEPARTMENT_OTHER): Payer: No Typology Code available for payment source | Admitting: Oncology

## 2019-12-31 ENCOUNTER — Inpatient Hospital Stay: Payer: No Typology Code available for payment source

## 2019-12-31 ENCOUNTER — Inpatient Hospital Stay: Payer: No Typology Code available for payment source | Attending: Oncology

## 2019-12-31 VITALS — BP 140/86 | HR 67 | Temp 97.2°F | Resp 16 | Wt 151.3 lb

## 2019-12-31 DIAGNOSIS — Z9484 Stem cells transplant status: Secondary | ICD-10-CM | POA: Diagnosis not present

## 2019-12-31 DIAGNOSIS — Z7983 Long term (current) use of bisphosphonates: Secondary | ICD-10-CM

## 2019-12-31 DIAGNOSIS — C9001 Multiple myeloma in remission: Secondary | ICD-10-CM | POA: Diagnosis not present

## 2019-12-31 DIAGNOSIS — Z800 Family history of malignant neoplasm of digestive organs: Secondary | ICD-10-CM | POA: Diagnosis not present

## 2019-12-31 DIAGNOSIS — Z79899 Other long term (current) drug therapy: Secondary | ICD-10-CM | POA: Insufficient documentation

## 2019-12-31 DIAGNOSIS — G620 Drug-induced polyneuropathy: Secondary | ICD-10-CM | POA: Insufficient documentation

## 2019-12-31 DIAGNOSIS — Z8701 Personal history of pneumonia (recurrent): Secondary | ICD-10-CM | POA: Diagnosis not present

## 2019-12-31 DIAGNOSIS — Z87891 Personal history of nicotine dependence: Secondary | ICD-10-CM | POA: Insufficient documentation

## 2019-12-31 DIAGNOSIS — T451X5A Adverse effect of antineoplastic and immunosuppressive drugs, initial encounter: Secondary | ICD-10-CM | POA: Diagnosis not present

## 2019-12-31 DIAGNOSIS — N289 Disorder of kidney and ureter, unspecified: Secondary | ICD-10-CM | POA: Insufficient documentation

## 2019-12-31 LAB — COMPREHENSIVE METABOLIC PANEL
ALT: 16 U/L (ref 0–44)
AST: 20 U/L (ref 15–41)
Albumin: 3.9 g/dL (ref 3.5–5.0)
Alkaline Phosphatase: 60 U/L (ref 38–126)
Anion gap: 8 (ref 5–15)
BUN: 17 mg/dL (ref 6–20)
CO2: 25 mmol/L (ref 22–32)
Calcium: 8.1 mg/dL — ABNORMAL LOW (ref 8.9–10.3)
Chloride: 103 mmol/L (ref 98–111)
Creatinine, Ser: 1.37 mg/dL — ABNORMAL HIGH (ref 0.61–1.24)
GFR calc Af Amer: >60 mL/min (ref >60)
GFR calc non Af Amer: >60 mL/min (ref >60)
Glucose, Bld: 98 mg/dL (ref 70–99)
Potassium: 3.6 mmol/L (ref 3.5–5.1)
Sodium: 136 mmol/L (ref 135–145)
Total Bilirubin: 0.5 mg/dL (ref 0.3–1.2)
Total Protein: 6.6 g/dL (ref 6.5–8.1)

## 2019-12-31 LAB — CBC WITH DIFFERENTIAL/PLATELET
Abs Immature Granulocytes: 0.02 10*3/uL (ref 0.00–0.07)
Basophils Absolute: 0 10*3/uL (ref 0.0–0.1)
Basophils Relative: 1 %
Eosinophils Absolute: 0.2 10*3/uL (ref 0.0–0.5)
Eosinophils Relative: 4 %
HCT: 40.2 % (ref 39.0–52.0)
Hemoglobin: 14.4 g/dL (ref 13.0–17.0)
Immature Granulocytes: 0 %
Lymphocytes Relative: 17 %
Lymphs Abs: 0.8 10*3/uL (ref 0.7–4.0)
MCH: 33.1 pg (ref 26.0–34.0)
MCHC: 35.8 g/dL (ref 30.0–36.0)
MCV: 92.4 fL (ref 80.0–100.0)
Monocytes Absolute: 0.4 10*3/uL (ref 0.1–1.0)
Monocytes Relative: 8 %
Neutro Abs: 3.4 10*3/uL (ref 1.7–7.7)
Neutrophils Relative %: 70 %
Platelets: 157 10*3/uL (ref 150–400)
RBC: 4.35 MIL/uL (ref 4.22–5.81)
RDW: 12.7 % (ref 11.5–15.5)
WBC: 4.9 10*3/uL (ref 4.0–10.5)
nRBC: 0 % (ref 0.0–0.2)

## 2020-01-03 LAB — MULTIPLE MYELOMA PANEL, SERUM
Albumin SerPl Elph-Mcnc: 3.5 g/dL (ref 2.9–4.4)
Albumin/Glob SerPl: 1.5 (ref 0.7–1.7)
Alpha 1: 0.1 g/dL (ref 0.0–0.4)
Alpha2 Glob SerPl Elph-Mcnc: 0.6 g/dL (ref 0.4–1.0)
B-Globulin SerPl Elph-Mcnc: 0.9 g/dL (ref 0.7–1.3)
Gamma Glob SerPl Elph-Mcnc: 0.8 g/dL (ref 0.4–1.8)
Globulin, Total: 2.5 g/dL (ref 2.2–3.9)
IgA: 154 mg/dL (ref 90–386)
IgG (Immunoglobin G), Serum: 704 mg/dL (ref 603–1613)
IgM (Immunoglobulin M), Srm: 45 mg/dL (ref 20–172)
Total Protein ELP: 6 g/dL (ref 6.0–8.5)

## 2020-01-03 LAB — KAPPA/LAMBDA LIGHT CHAINS
Kappa free light chain: 15.1 mg/L (ref 3.3–19.4)
Kappa, lambda light chain ratio: 1.76 — ABNORMAL HIGH (ref 0.26–1.65)
Lambda free light chains: 8.6 mg/L (ref 5.7–26.3)

## 2020-01-06 NOTE — Progress Notes (Signed)
Hematology/Oncology Consult note [facility]  [phone] [phone]  Patient Care Team: James Koch, NP as PCP - General (Internal Medicine) James Redhead, MD as PCP - Hematology/Oncology James Guadeloupe, MD as Consulting Physician (Hematology and Oncology)   Name of the patient: James House  [id number]  [birth year]   Date of visit: 01/06/20  Diagnosis- light chain kappa multiple myeloma s/p ASCT currently in remission. Patient on maintenance revlimid  Chief complaint/ Reason for visit- routine f/u of multiple myeloma on revlimid  Heme/Onc history: Patient is a 49 year old male with a history of kappa light chain myeloma that was treated by Dr. Naomie House. History is as follows:  1. Patient presented with renal insufficiency with a creatinine of 2.4 in April 2018. Kidney biopsy showed myeloma cast nephropathy. Kappa light chain was 3004 and lambda 0.22 with a kappa lambda ratio of 13,000 655. Skeletal survey showed multiple calvarial and marrow lesions. Bone marrow biopsy in May 2018 showed 43% plasma cells. He received CyBorDfor cycle 1 in May 2018 and was subsequently switched to RVD. He received 4 cycles followed by a repeat bone marrow biopsy in August 2018 which showed no increase in plasma cells.  2.He underwent autologous stem cell transplantation on 01/30/2017. He was started on Revlimid maintenance 10 mg daily in January 2019.  3.Labs from January February and March 2019 done at [facility] showed no M spike, normal kappa lambda ratio of 1.06. He was last seen by them in April 2019.  Following that patient was admitted to [facility] for healthcare associated pneumonia and was recently discharged. He wished to transfer his care to [place] at this time.  With regards to his myeloma patient is currently on 10 mg of Revlimid daily. He is also on acyclovir 400 mg twice daily which he is to continue for a minimum of 1 year post  transplant. Patient has chronic back pain as well as chemo-induced peripheral neuropathy for which he was seen by [name] in the past and is currently seeing [facility] pain clinic and is under pain contract with them   Interval history- reports baseline fatigue and chronic neuropathy in his hands and feet that has remained unchanged. He has not taken his covid vaccine yet  ECOG PS- 1 Pain scale- 8 Opioid associated constipation- no  Review of systems- Review of Systems  Constitutional: Positive for malaise/fatigue. Negative for chills, fever and weight loss.  HENT: Negative for congestion, ear discharge and nosebleeds.   Eyes: Negative for blurred vision.  Respiratory: Negative for cough, hemoptysis, sputum production, shortness of breath and wheezing.   Cardiovascular: Negative for chest pain, palpitations, orthopnea and claudication.  Gastrointestinal: Negative for abdominal pain, blood in stool, constipation, diarrhea, heartburn, melena, nausea and vomiting.  Genitourinary: Negative for dysuria, flank pain, frequency, hematuria and urgency.  Musculoskeletal: Negative for back pain, joint pain and myalgias.  Skin: Negative for rash.  Neurological: Positive for sensory change (peripheral neuropathy). Negative for dizziness, tingling, focal weakness, seizures, weakness and headaches.  Endo/Heme/Allergies: Does not bruise/bleed easily.  Psychiatric/Behavioral: Negative for depression and suicidal ideas. The patient does not have insomnia.       No Known Allergies   Past Medical History:  Diagnosis Date  . CKD (chronic kidney disease) stage 3, GFR 30-59 ml/min   . Hypertension   . Multiple myeloma ([place]) 08/11/2017  . Neuropathy   . Pneumonia      Past Surgical History:  Procedure Laterality Date  .  ABDOMINAL SURGERY    . JOINT REPLACEMENT     right knee  . KNEE SURGERY Left     Social History   Socioeconomic History  . Marital status: Married    Spouse name:  James House  . Number of children: 3  . Years of education: Not on file  . Highest education level: Not on file  Occupational History  . Not on file  Tobacco Use  . Smoking status: Former Smoker    Quit date: 08/19/2013    Years since quitting: 6.3  . Smokeless tobacco: Current User    Types: Snuff  Vaping Use  . Vaping Use: Never used  Substance and Sexual Activity  . Alcohol use: No  . Drug use: No  . Sexual activity: Yes  Other Topics Concern  . Not on file  Social History Narrative   Live in private residence with spouse and mother in law   Social Determinants of Health   Financial Resource Strain:   . Difficulty of Paying Living Expenses: Not on file  Food Insecurity:   . Worried About Charity fundraiser in the Last Year: Not on file  . Ran Out of Food in the Last Year: Not on file  Transportation Needs:   . Lack of Transportation (Medical): Not on file  . Lack of Transportation (Non-Medical): Not on file  Physical Activity:   . Days of Exercise per Week: Not on file  . Minutes of Exercise per Session: Not on file  Stress:   . Feeling of Stress : Not on file  Social Connections:   . Frequency of Communication with Friends and Family: Not on file  . Frequency of Social Gatherings with Friends and Family: Not on file  . Attends Religious Services: Not on file  . Active Member of Clubs or Organizations: Not on file  . Attends Archivist Meetings: Not on file  . Marital Status: Not on file  Intimate Partner Violence:   . Fear of Current or Ex-Partner: Not on file  . Emotionally Abused: Not on file  . Physically Abused: Not on file  . Sexually Abused: Not on file    Family History  Problem Relation Age of Onset  . Cancer Mother 60       Pancreatic  . COPD Mother   . Diabetes Mother   . Hyperlipidemia Mother   . Hypertension Mother   . Cancer Maternal Uncle        pancreatic  . Cancer Maternal Grandmother 50       colon  . COPD Maternal Grandmother    . Diabetes Maternal Grandmother   . Hypertension Maternal Grandmother      Current Outpatient Medications:  .  aspirin EC 81 MG tablet, Take 81 mg by mouth daily., Disp: , Rfl:  .  baclofen (LIORESAL) 10 MG tablet, Take 1-2 tablets (10-20 mg total) by mouth 4 (four) times daily., Disp: 240 tablet, Rfl: 5 .  lenalidomide (REVLIMID) 5 MG capsule, TAKE 1 CAPSULE BY MOUTH DAILY FOR 28 DAYS, Disp: 28 capsule, Rfl: 0 .  oxyCODONE (OXY IR/ROXICODONE) 5 MG immediate release tablet, Take 1 tablet (5 mg total) by mouth every 8 (eight) hours as needed for severe pain. Must last 30 days, Disp: 90 tablet, Rfl: 0 .  pregabalin (LYRICA) 150 MG capsule, Take 1 capsule (150 mg total) by mouth 3 (three) times daily., Disp: 90 capsule, Rfl: 5 .  sertraline (ZOLOFT) 25 MG tablet, TAKE 1 TABLET (25  MG TOTAL) BY MOUTH DAILY., Disp: 30 tablet, Rfl: 2 .  hydrOXYzine (ATARAX/VISTARIL) 10 MG tablet, Take 1-2 tablets (10-20 mg total) by mouth 3 (three) times daily as needed for itching. May cause drowsiness. (Patient not taking: Reported on 12/31/2019), Disp: 30 tablet, Rfl: 0 .  oxyCODONE (OXY IR/ROXICODONE) 5 MG immediate release tablet, Take 1 tablet (5 mg total) by mouth every 8 (eight) hours as needed for severe pain. Must last 30 days, Disp: 90 tablet, Rfl: 0 .  [START ON 01/07/2020] oxyCODONE (OXY IR/ROXICODONE) 5 MG immediate release tablet, Take 1 tablet (5 mg total) by mouth every 8 (eight) hours as needed for severe pain. Must last 30 days, Disp: 90 tablet, Rfl: 0  Physical exam:  Vitals:   12/31/19 1456 12/31/19 1538  BP: (!) 143/102 140/86  Pulse: 67   Resp: 16   Temp: (!) 97.2 F (36.2 C)   TempSrc: Tympanic   SpO2: 100%   Weight: 151 lb 4.8 oz (68.6 kg)    Physical Exam Constitutional:      General: He is not in acute distress. Cardiovascular:     Rate and Rhythm: Normal rate and regular rhythm.     Heart sounds: Normal heart sounds.  Pulmonary:     Effort: Pulmonary effort is normal.      Breath sounds: Normal breath sounds.  Abdominal:     General: Bowel sounds are normal.     Palpations: Abdomen is soft.  Skin:    General: Skin is warm and dry.  Neurological:     Mental Status: He is alert and oriented to person, place, and time.      CMP Latest Ref Rng & Units 12/31/2019  Glucose 70 - 99 mg/dL 98  BUN 6 - 20 mg/dL 17  Creatinine 0.61 - 1.24 mg/dL 1.37(H)  Sodium 135 - 145 mmol/L 136  Potassium 3.5 - 5.1 mmol/L 3.6  Chloride 98 - 111 mmol/L 103  CO2 22 - 32 mmol/L 25  Calcium 8.9 - 10.3 mg/dL 8.1(L)  Total Protein 6.5 - 8.1 g/dL 6.6  Total Bilirubin 0.3 - 1.2 mg/dL 0.5  Alkaline Phos 38 - 126 U/L 60  AST 15 - 41 U/L 20  ALT 0 - 44 U/L 16   CBC Latest Ref Rng & Units 12/31/2019  WBC 4.0 - 10.5 K/uL 4.9  Hemoglobin 13.0 - 17.0 g/dL 14.4  Hematocrit 39 - 52 % 40.2  Platelets 150 - 400 K/uL 157     Assessment and plan- Patient is a 49 y.o. male  with history of light chain multiple myeloma currently on maintenance Revlimid s/p autologous stem cell transplant. He eis here for routine f/u  Multiple myeloma- labs continue to show remission status. FLC ratio is normal. No M protein. Baseline ckd stable. No anemia. Will repeat bone survey. On maintenance revlimid 5 mg  Chemo induced peripheral neuropathy- he sees pain clinic. On lyrica and oxycodone  Will hold off on giving further xgeva. He has been receiving monthly for over 3 years now. Will restart at progression  Encouraged patient to get his covid vaccine   Visit Diagnosis 1. Multiple myeloma in remission ([place])   2. High risk medication use   3. Long term (current) use of bisphosphonates      Dr. Randa Evens, MD, MPH [facility] [place] [facility] [phone] 01/06/2020 1:20 PM

## 2020-01-10 MED FILL — oxyCODONE HCL 5 MG TABS: 5 | 30 days supply | Qty: 90 | Fill #0

## 2020-01-12 MED FILL — PREGABALIN 150 MG CAPS: 150 | 30 days supply | Qty: 90 | Fill #5

## 2020-01-13 MED FILL — BACLOFEN 10 MG TABS: 10 | 30 days supply | Qty: 240 | Fill #5

## 2020-01-17 ENCOUNTER — Other Ambulatory Visit: Payer: Self-pay | Admitting: Oncology

## 2020-01-17 DIAGNOSIS — C9001 Multiple myeloma in remission: Secondary | ICD-10-CM

## 2020-01-19 ENCOUNTER — Ambulatory Visit: Payer: No Typology Code available for payment source | Admitting: Pain Medicine

## 2020-01-21 MED FILL — SERTRALINE HCL 25 MG TABLET: 25 | 30 days supply | Qty: 30 | Fill #2

## 2020-01-30 NOTE — Progress Notes (Signed)
Patient: James House.  Service Category: E/M  Provider: Gaspar Cola, MD  DOB: [birth year]  DOS: 01/31/2020  Location: Office  MRN: [id number]  Setting: Ambulatory outpatient  Referring Provider: Pleas Koch, NP  Type: Established Patient  Specialty: Interventional Pain Management  PCP: Pleas Koch, NP  Location: Remote location  Delivery: TeleHealth     Virtual Encounter - Pain Management PROVIDER NOTE: Information contained herein reflects review and annotations entered in association with encounter. Interpretation of such information and data should be left to medically-trained personnel. Information provided to patient can be located elsewhere in the medical record under "Patient Instructions". Document created using STT-dictation technology, any transcriptional errors that may result from process are unintentional.    Contact & Pharmacy Preferred: [phone] Home: [phone] (home) Mobile: [phone] (mobile) E-mail: [email or web address]  [place], [place] [address] Phone: [phone] Fax: [phone]  [facility] - [place], [place] [address] Phone: [phone] Fax: [phone]   Pre-screening  James House offered "in-person" vs "virtual" encounter. He indicated preferring virtual for this encounter.   Reason COVID-19*   Social distancing based on CDC and AMA recommendations.   I contacted James House. on 01/31/2020 via telephone.      I clearly identified myself as Gaspar Cola, MD. I verified that I was speaking with the correct person using two identifiers (Name: James Mohon., and date of birth: [birth year]).  Consent I sought verbal advanced consent from James House. for virtual visit interactions. I informed James House of possible security and privacy concerns, risks, and limitations  associated with providing "not-in-person" medical evaluation and management services. I also informed James House of the availability of "in-person" appointments. Finally, I informed him that there would be a charge for the virtual visit and that he could be  personally, fully or partially, financially responsible for it. James House expressed understanding and agreed to proceed.   Historic Elements   James House. is a 49 y.o. year old, male patient evaluated today after our last contact on 08/16/2019. James House  has a past medical history of CKD (chronic kidney disease) stage 3, GFR 30-59 ml/min, Hypertension, Multiple myeloma ([place]) (08/11/2017), Neuropathy, and Pneumonia. He also  has a past surgical history that includes Joint replacement; Knee surgery (Left); and Abdominal surgery. James House has a current medication list which includes the following prescription(s): aspirin ec, [START ON 02/06/2020] baclofen, hydroxyzine, lenalidomide, [START ON 02/06/2020] oxycodone, [START ON 02/06/2020] pregabalin, and sertraline. He  reports that he quit smoking about 6 years ago. His smokeless tobacco use includes snuff. He reports that he does not drink alcohol and does not use drugs. James House has No Known Allergies.   HPI  Today, he is being contacted for medication management.  The patient indicates doing well with the current medication regimen. No adverse reactions or side effects reported to the medications.  The patient was scheduled today for a face-to-face visit, but unfortunately he is sick and was unable to make the appointment.  He requested to switch her to a virtual visit.  When I called him today he had some severe hoarseness and was coughing during the entire encounter. RTCB: 03/07/2020 On 11/08/2019 I had ordered a UDS, which apparently was not done.  The patient's last UDS on file is from 04/02/2018. Transfer nonnarcotic: Baclofen and  Lyrica.  Pharmacotherapy Assessment  Analgesic: Oxycodone  IR 5 mg, 1 tab PO BID (10 mg/day of oxycodone) MME/day: 15 mg/day.   Monitoring: [place] PMP: PDMP reviewed during this encounter.       Pharmacotherapy: No side-effects or adverse reactions reported. Compliance: No problems identified. Effectiveness: Clinically acceptable. Plan: Refer to "POC".  UDS:  Summary  Date Value Ref Range Status  04/02/2018 FINAL  Final    Comment:    ==================================================================== TOXASSURE SELECT 13 (MW) ==================================================================== Test                             Result       Flag       Units Drug Present and Declared for Prescription Verification   Oxycodone                      138          EXPECTED   ng/mg creat   Oxymorphone                    52           EXPECTED   ng/mg creat   Noroxycodone                   135          EXPECTED   ng/mg creat    Sources of oxycodone include scheduled prescription medications.    Oxymorphone and noroxycodone are expected metabolites of    oxycodone. Oxymorphone is also available as a scheduled    prescription medication. ==================================================================== Test                      Result    Flag   Units      Ref Range   Creatinine              168              mg/dL      >=20 ==================================================================== Declared Medications:  The flagging and interpretation on this report are based on the  following declared medications.  Unexpected results may arise from  inaccuracies in the declared medications.  **Note: The testing scope of this panel includes these medications:  Oxycodone  **Note: The testing scope of this panel does not include following  reported medications:  Acetaminophen  Acyclovir  Amoxicillin (Augmentin)  Aspirin (Aspirin 81)  Baclofen  Clavulinate (Augmentin)  Gabapentin  Lenalidomide  Meloxicam  Pantoprazole  Prednisone  Pregabalin   Promethazine  Vitamin D2 (Ergocalciferol) ==================================================================== For clinical consultation, please call [phone]. ====================================================================     Laboratory Chemistry Profile   Renal Lab Results  Component Value Date   BUN 17 12/31/2019   CREATININE 1.37 (H) 12/31/2019   BCR 7 (L) 11/26/2017   GFRAA >60 12/31/2019   GFRNONAA >60 12/31/2019     Hepatic Lab Results  Component Value Date   AST 20 12/31/2019   ALT 16 12/31/2019   ALBUMIN 3.9 12/31/2019   ALKPHOS 60 12/31/2019   LIPASE 52 (H) 12/22/2017     Electrolytes Lab Results  Component Value Date   NA 136 12/31/2019   K 3.6 12/31/2019   CL 103 12/31/2019   CALCIUM 8.1 (L) 12/31/2019   MG 2.4 (H) 11/26/2017     Bone Lab Results  Component Value Date   25OHVITD1 41 11/26/2017   25OHVITD2 4.4  11/26/2017   25OHVITD3 37 11/26/2017   TESTOSTERONE 570 12/29/2017     Inflammation (CRP: Acute Phase) (ESR: Chronic Phase) Lab Results  Component Value Date   CRP 9 11/26/2017   ESRSEDRATE 16 (H) 11/26/2017   LATICACIDVEN 1.1 08/11/2017       Note: Above Lab results reviewed.  Imaging  DG Bone Survey Met CLINICAL DATA:  Multiple myeloma, currently in remission.  EXAM: METASTATIC BONE SURVEY  COMPARISON:  CT scan 12/22/2017  FINDINGS: The lateral skull film demonstrates numerous small lytic myelomatous lesions.  A few small scattered lytic myelomatous lesions are suspected in the clavicles and proximal humeri bilaterally.  Do not see any obvious lesions involving the spine or ribs. Remote healed rib fractures are noted.  No definite lesions involving the bony pelvis or lower extremities.  IMPRESSION: 1. Suspect small lytic myelomatous lesions involving the skull, clavicles and bilateral proximal humeri. 2. No other definite lesions are identified.  Electronically Signed   By: Marijo Sanes M.D.   On:  10/22/2018 15:51  Assessment  The primary encounter diagnosis was Chronic pain syndrome. Diagnoses of Chronic low back pain (Primary Area of Pain) (Bilateral) (L>R) w/o sciatica, Multiple myeloma, remission status unspecified (HCC), DDD (degenerative disc disease), thoracic, Non-traumatic compression fracture of T7 thoracic vertebra, sequela, Thoracic radiculitis (Right), Pharmacologic therapy, Chemotherapy-induced peripheral neuropathy ([place]), Neuropathic pain, Neurogenic pain, Spasm of back muscles, and Chronic musculoskeletal pain were also pertinent to this visit.  Plan of Care  Problem-specific:  No problem-specific Assessment & Plan notes found for this encounter.  Mr. Bode Pieper. has a current medication list which includes the following long-term medication(s): [START ON 02/06/2020] baclofen, [START ON 02/06/2020] oxycodone, [START ON 02/06/2020] pregabalin, and sertraline.  Pharmacotherapy (Medications Ordered): Meds ordered this encounter  Medications   pregabalin (LYRICA) 150 MG capsule    Sig: Take 1 capsule (150 mg total) by mouth 3 (three) times daily.    Dispense:  90 capsule    Refill:  2    Fill one day early if pharmacy is closed on scheduled refill date. May substitute for generic, or similar, if available.   baclofen (LIORESAL) 10 MG tablet    Sig: Take 1-2 tablets (10-20 mg total) by mouth 4 (four) times daily.    Dispense:  240 tablet    Refill:  2    Fill one day early if pharmacy is closed on scheduled refill date. May substitute for generic if available.   oxyCODONE (OXY IR/ROXICODONE) 5 MG immediate release tablet    Sig: Take 1 tablet (5 mg total) by mouth every 8 (eight) hours as needed for severe pain. Must last 30 days    Dispense:  90 tablet    Refill:  0    Chronic Pain: STOP Act (Not applicable) Fill 1 day early if closed on refill date. Avoid benzodiazepines within 8 hours of opioids   Orders:  No orders of the defined types were placed in this  encounter.  Follow-up plan:   Return in about 5 weeks (around 03/07/2020) for (F2F), (Med Mgmt).      Interventional management options:  Considering:   DiagnosticbilateralLESI Possible bilateral lumbar facet RFA Diagnostic bilateral sacroiliac joint block Possible bilateral sacroiliac joint RFA Diagnostic right knee Hyalgan series Diagnosticright knee intra-articular knee injection Diagnostic left knee genicular nerve block   Palliative PRN treatment(s):   Palliative bilateral IA hip joint injection #2 (50/50/50/50) Palliative bilateral lumbar facet block #3 (100/100/0) (60/60/60) Palliative left piriformis muscle injection #  2 (100/100/0)    Recent Visits Date Type Provider Dept  11/08/19 Telemedicine Milinda Pointer, MD [facility]  Showing recent visits within past 90 days and meeting all other requirements Today's Visits Date Type Provider Dept  01/31/20 Telemedicine Milinda Pointer, MD [facility]  Showing today's visits and meeting all other requirements Future Appointments No visits were found meeting these conditions. Showing future appointments within next 90 days and meeting all other requirements  I discussed the assessment and treatment plan with the patient. The patient was provided an opportunity to ask questions and all were answered. The patient agreed with the plan and demonstrated an understanding of the instructions.  Patient advised to call back or seek an in-person evaluation if the symptoms or condition worsens.  Duration of encounter: 12 minutes.  Note by: Gaspar Cola, MD Date: 01/31/2020; Time: 9:49 AM

## 2020-01-31 ENCOUNTER — Other Ambulatory Visit: Payer: Self-pay

## 2020-01-31 ENCOUNTER — Other Ambulatory Visit: Payer: Self-pay | Admitting: Pain Medicine

## 2020-01-31 ENCOUNTER — Ambulatory Visit: Payer: No Typology Code available for payment source | Attending: Pain Medicine | Admitting: Pain Medicine

## 2020-01-31 DIAGNOSIS — G894 Chronic pain syndrome: Secondary | ICD-10-CM | POA: Diagnosis not present

## 2020-01-31 DIAGNOSIS — C9000 Multiple myeloma not having achieved remission: Secondary | ICD-10-CM

## 2020-01-31 DIAGNOSIS — M545 Low back pain, unspecified: Secondary | ICD-10-CM | POA: Diagnosis not present

## 2020-01-31 DIAGNOSIS — T451X5A Adverse effect of antineoplastic and immunosuppressive drugs, initial encounter: Secondary | ICD-10-CM

## 2020-01-31 DIAGNOSIS — M7918 Myalgia, other site: Secondary | ICD-10-CM

## 2020-01-31 DIAGNOSIS — G8929 Other chronic pain: Secondary | ICD-10-CM

## 2020-01-31 DIAGNOSIS — M5414 Radiculopathy, thoracic region: Secondary | ICD-10-CM

## 2020-01-31 DIAGNOSIS — G620 Drug-induced polyneuropathy: Secondary | ICD-10-CM

## 2020-01-31 DIAGNOSIS — M4854XS Collapsed vertebra, not elsewhere classified, thoracic region, sequela of fracture: Secondary | ICD-10-CM

## 2020-01-31 DIAGNOSIS — M5134 Other intervertebral disc degeneration, thoracic region: Secondary | ICD-10-CM

## 2020-01-31 DIAGNOSIS — Z79899 Other long term (current) drug therapy: Secondary | ICD-10-CM

## 2020-01-31 DIAGNOSIS — M62830 Muscle spasm of back: Secondary | ICD-10-CM

## 2020-01-31 DIAGNOSIS — M792 Neuralgia and neuritis, unspecified: Secondary | ICD-10-CM

## 2020-01-31 MED ORDER — OXYCODONE HCL 5 MG PO TABS: 5.0000 mg | ORAL_TABLET | Freq: Three times a day (TID) | ORAL | 0 refills | Status: DC | PRN

## 2020-01-31 MED ORDER — BACLOFEN 10 MG PO TABS: 10.0000 mg | ORAL_TABLET | Freq: Four times a day (QID) | ORAL | 2 refills | Status: DC

## 2020-01-31 MED ORDER — PREGABALIN 150 MG PO CAPS: 150.0000 mg | ORAL_CAPSULE | Freq: Three times a day (TID) | ORAL | 2 refills | Status: DC

## 2020-02-07 ENCOUNTER — Telehealth: Payer: Self-pay | Admitting: Pharmacy Technician

## 2020-02-07 NOTE — Telephone Encounter (Signed)
[name]  Patient informed office that he no longer had insurance and could not get his Revlimid.  I called and spoke with Jenny Reichmann on 10/12 and emailed an application for assistance through [employer].  Called patient today to check on the status of the application and he stated he was working on filling it out and should be able to send it to me tomorrow.  Provider portion of the application has been signed.    Once patient portion received I will fax both to [place] Patient [facility].  [place] Patient [place] Phone [phone] Fax [phone] 02/07/2020 3:19 PM

## 2020-02-09 MED FILL — oxyCODONE HCL 5 MG TABS: 5 | 30 days supply | Qty: 90 | Fill #0

## 2020-02-09 MED FILL — PREGABALIN 150 MG CAPS: 150 | 30 days supply | Qty: 90 | Fill #0

## 2020-02-10 NOTE — Telephone Encounter (Signed)
Patient called today.  He emailed his portion of the application in to me.    Faxed application to [place] at [phone].  [place] Patient [place] phone number to follow up is [phone].  [place] Patient [place] Phone [phone] Fax [phone] 02/10/2020 4:27 PM

## 2020-02-15 NOTE — Telephone Encounter (Signed)
[name]  Per [employer] Access support rep, patient has been referred to [place] Patient [facility] for Revlimid.  BMSPAF will contact the office with status updates or we will reach out every couple days.  [place] Patient [place] Phone [phone] Fax [phone] 02/15/2020 3:32 PM

## 2020-02-17 ENCOUNTER — Other Ambulatory Visit: Payer: Self-pay | Admitting: *Deleted

## 2020-02-17 MED ORDER — LENALIDOMIDE 5 MG PO CAPS: 5.0000 mg | ORAL_CAPSULE | Freq: Every day | ORAL | 3 refills | Status: DC

## 2020-02-17 NOTE — Telephone Encounter (Signed)
Sherry sent prescription to [facility].  REMS auth expires 02/20/20.  May need to get another auth if patient not able to get med processed before then.  [place] Patient [place] Phone [phone] Fax [phone] 02/17/2020 9:19 AM

## 2020-02-17 NOTE — Telephone Encounter (Signed)
[name]  Received notification from [place] Patient [facility] that patient has been successfully enrolled into their program to receive Revlimid from the manufacturer at $0 out of pocket until 02/14/2021.    I called and spoke with patient.  He knows we will have to re-apply.   Specialty Pharmacy that will dispense medication is [facility] by [address] address).    Patient knows to call the office with questions or concerns.   Oral Oncology Clinic will continue to follow.  [place] Patient [place] Phone [phone] Fax [phone] 02/17/2020 8:26 AM

## 2020-02-22 ENCOUNTER — Other Ambulatory Visit: Payer: Self-pay | Admitting: Oncology

## 2020-02-22 MED FILL — SERTRALINE HCL 25 MG TABLET: 25 | 30 days supply | Qty: 30 | Fill #0

## 2020-03-06 ENCOUNTER — Other Ambulatory Visit: Payer: Self-pay

## 2020-03-06 ENCOUNTER — Ambulatory Visit (INDEPENDENT_AMBULATORY_CARE_PROVIDER_SITE_OTHER): Payer: Self-pay | Admitting: Primary Care

## 2020-03-06 ENCOUNTER — Other Ambulatory Visit: Payer: Self-pay | Admitting: Primary Care

## 2020-03-06 VITALS — BP 134/68 | HR 75 | Temp 97.5°F | Resp 16 | Ht 67.0 in | Wt 157.4 lb

## 2020-03-06 DIAGNOSIS — G8929 Other chronic pain: Secondary | ICD-10-CM

## 2020-03-06 DIAGNOSIS — I10 Essential (primary) hypertension: Secondary | ICD-10-CM

## 2020-03-06 DIAGNOSIS — M5442 Lumbago with sciatica, left side: Secondary | ICD-10-CM

## 2020-03-06 DIAGNOSIS — N183 Chronic kidney disease, stage 3 unspecified: Secondary | ICD-10-CM

## 2020-03-06 DIAGNOSIS — C9000 Multiple myeloma not having achieved remission: Secondary | ICD-10-CM

## 2020-03-06 DIAGNOSIS — G620 Drug-induced polyneuropathy: Secondary | ICD-10-CM

## 2020-03-06 DIAGNOSIS — M792 Neuralgia and neuritis, unspecified: Secondary | ICD-10-CM

## 2020-03-06 DIAGNOSIS — T451X5A Adverse effect of antineoplastic and immunosuppressive drugs, initial encounter: Secondary | ICD-10-CM

## 2020-03-06 DIAGNOSIS — M62830 Muscle spasm of back: Secondary | ICD-10-CM

## 2020-03-06 DIAGNOSIS — M5441 Lumbago with sciatica, right side: Secondary | ICD-10-CM

## 2020-03-06 DIAGNOSIS — G894 Chronic pain syndrome: Secondary | ICD-10-CM

## 2020-03-06 DIAGNOSIS — M7918 Myalgia, other site: Secondary | ICD-10-CM

## 2020-03-06 MED ORDER — PREGABALIN 150 MG PO CAPS: 150.0000 mg | ORAL_CAPSULE | Freq: Three times a day (TID) | ORAL | 0 refills | Status: DC

## 2020-03-06 MED ORDER — BACLOFEN 10 MG PO TABS: 10.0000 mg | ORAL_TABLET | Freq: Four times a day (QID) | ORAL | 0 refills | Status: DC

## 2020-03-06 MED FILL — PREGABALIN 150 MG CAPS: 150 | 30 days supply | Qty: 90 | Fill #1

## 2020-03-06 NOTE — Assessment & Plan Note (Addendum)
Borderline high in the office today, discussed for him to start monitoring at home. He will update if readings are consistently at or above 130/90.  CMP reviewed from chart. Lipid panel pending for next lab draw in December.

## 2020-03-06 NOTE — Assessment & Plan Note (Signed)
Following with pain management he will no longer be prescribing baclofen or Lyrica.  I agreed to take over both medications given his history and ongoing symptoms.  We discussed that he should try to reduce the frequency of administration of both medications when possible.  Refills sent for both medications.  Oxycodone will continue to be prescribed by pain management.

## 2020-03-06 NOTE — Patient Instructions (Addendum)
Have your Cholesterol level drawn during your next lab draw through oncology.  Try to reduce use of the Baclofen and Lyrica if possible.  Monitor your blood pressure at home, it should run less than 130 on top and less than 90 on bottom.   It was a pleasure to see you today!

## 2020-03-06 NOTE — Assessment & Plan Note (Signed)
Monitored by cancer center.  Recent CMP reviewed which shows stability.  Avoid nephrotoxic agents.

## 2020-03-06 NOTE — Progress Notes (Signed)
Subjective:    Patient ID: James House., male    DOB: 06/30/1970, 49 y.o.   MRN: [id number]  HPI  This visit occurred during the SARS-CoV-2 public health emergency.  Safety protocols were in place, including screening questions prior to the visit, additional usage of staff PPE, and extensive cleaning of exam room while observing appropriate contact time as indicated for disinfecting solutions.   Mr. James House is a 49 year old male with a history of hypertension, chronic low back pain, chemotherapy induced neuropathy, osteoarthritis, CKD, multiple myeloma, chronic pain syndrome who presents today for general follow up and for medication refill.  1) Chronic Back Pain and Lower Extremity Pain/Neuopathy: Currently following with pain management every three months. He is managed on oxycodone IR 5 mg every 8 hours daily, also on Lyrica 150 mg three times daily, and baclofen 10 mg QID. He's been taking this regimen for 6+ months.   Overall he does well on this regimen except when he's more sedentary. His pain management doctor would like for his PCP to take over prescribing Lyrica and Baclofen. He tried gabapentin at high doses which was ineffective. He has not tried reducing the amount of Baclofen or Lyrica.   2) Anxiety and Depression: Currently managed on Zoloft 25 mg for which he's been taking for quite some time. This is prescribed by his oncologist. Overall he feels well managed, the Zoloft helps for him to sleep.   3) History of Multiple Myeloma: Managed on Revlimid 5 mg for which he will continue take for his lifetime. Following with Dr. Janese Banks every 2-3 months. Due for PET scan and follow up labs next months.   BP Readings from Last 3 Encounters:  03/06/20 134/68  12/31/19 140/86  09/27/19 129/83     Review of Systems  Eyes: Negative for visual disturbance.  Respiratory: Negative for shortness of breath.   Cardiovascular: Negative for chest pain.  Musculoskeletal: Positive for  arthralgias and back pain.  Skin: Negative for color change.  Neurological: Positive for numbness.  Psychiatric/Behavioral: The patient is not nervous/anxious.        Past Medical History:  Diagnosis Date  . CKD (chronic kidney disease) stage 3, GFR 30-59 ml/min   . Hypertension   . Multiple myeloma ([place]) 08/11/2017  . Neuropathy   . Pneumonia      Social History   Socioeconomic History  . Marital status: Married    Spouse name: [name]  . Number of children: 3  . Years of education: Not on file  . Highest education level: Not on file  Occupational History  . Not on file  Tobacco Use  . Smoking status: Former Smoker    Quit date: 08/19/2013    Years since quitting: 6.5  . Smokeless tobacco: Current User    Types: Snuff  Vaping Use  . Vaping Use: Never used  Substance and Sexual Activity  . Alcohol use: No  . Drug use: No  . Sexual activity: Yes  Other Topics Concern  . Not on file  Social History Narrative   Live in private residence with spouse and mother in law   Social Determinants of Health   Financial Resource Strain:   . Difficulty of Paying Living Expenses: Not on file  Food Insecurity:   . Worried About Charity fundraiser in the Last Year: Not on file  . Ran Out of Food in the Last Year: Not on file  Transportation Needs:   . Lack  of Transportation (Medical): Not on file  . Lack of Transportation (Non-Medical): Not on file  Physical Activity:   . Days of Exercise per Week: Not on file  . Minutes of Exercise per Session: Not on file  Stress:   . Feeling of Stress : Not on file  Social Connections:   . Frequency of Communication with Friends and Family: Not on file  . Frequency of Social Gatherings with Friends and Family: Not on file  . Attends Religious Services: Not on file  . Active Member of Clubs or Organizations: Not on file  . Attends Archivist Meetings: Not on file  . Marital Status: Not on file  Intimate Partner Violence:   .  Fear of Current or Ex-Partner: Not on file  . Emotionally Abused: Not on file  . Physically Abused: Not on file  . Sexually Abused: Not on file    Past Surgical History:  Procedure Laterality Date  . ABDOMINAL SURGERY    . JOINT REPLACEMENT     right knee  . KNEE SURGERY Left     Family History  Problem Relation Age of Onset  . Cancer Mother 33       Pancreatic  . COPD Mother   . Diabetes Mother   . Hyperlipidemia Mother   . Hypertension Mother   . Cancer Maternal Uncle        pancreatic  . Cancer Maternal Grandmother 82       colon  . COPD Maternal Grandmother   . Diabetes Maternal Grandmother   . Hypertension Maternal Grandmother     No Known Allergies  Current Outpatient Medications on File Prior to Visit  Medication Sig Dispense Refill  . aspirin EC 81 MG tablet Take 81 mg by mouth daily.    Marland Kitchen lenalidomide (REVLIMID) 5 MG capsule Take 1 capsule (5 mg total) by mouth daily. [employer] Auth # [id number]     Date Obtained 9/30. We got auth # but pt lost his insurance and just now got asst to start back on drug 28 capsule 3  . oxyCODONE (OXY IR/ROXICODONE) 5 MG immediate release tablet Take 1 tablet (5 mg total) by mouth every 8 (eight) hours as needed for severe pain. Must last 30 days 90 tablet 0  . sertraline (ZOLOFT) 25 MG tablet TAKE 1 TABLET (25 MG TOTAL) BY MOUTH DAILY. 30 tablet 2   No current facility-administered medications on file prior to visit.    BP 134/68   Pulse 75   Temp (!) 97.5 F (36.4 C) (Temporal)   Resp 16   Ht '5\' 7"'  (1.702 m)   Wt 157 lb 6.4 oz (71.4 kg)   SpO2 97%   BMI 24.65 kg/m    Objective:   Physical Exam Cardiovascular:     Rate and Rhythm: Normal rate and regular rhythm.  Pulmonary:     Effort: Pulmonary effort is normal.     Breath sounds: Normal breath sounds.  Musculoskeletal:     Cervical back: Neck supple.  Skin:    General: Skin is warm and dry.  Psychiatric:        Mood and Affect: Mood normal.              Assessment & Plan:

## 2020-03-06 NOTE — Assessment & Plan Note (Signed)
Chronic, does well on Lyrica 150 mg TID. Discussed today to try and reduce to BID if possible. He agrees and will try.   No longer prescribed by pain management.

## 2020-03-06 NOTE — Assessment & Plan Note (Signed)
Following with oncology regularly.  Recent labs and notes reviewed, he appears to be in remission at this time.  Continue Revlimid 5 mg daily. Continue sertraline 25 mg at bedtime for anxiety and sleep.

## 2020-03-06 NOTE — Assessment & Plan Note (Signed)
Chronic, following with pain management who is prescribing oxycodone IR.  Pain management no longer prescribing Baclofen or Lyrica. Given his long history of pain and chemotherapy induced neuropathy, we will take over both Baclofen and Lyrica.  Discussed to try dose decreases when possible.

## 2020-03-07 NOTE — Progress Notes (Signed)
PROVIDER NOTE: Information contained herein reflects review and annotations entered in association with encounter. Interpretation of such information and data should be left to medically-trained personnel. Information provided to patient can be located elsewhere in the medical record under "Patient Instructions". Document created using STT-dictation technology, any transcriptional errors that may result from process are unintentional.    Patient: James House.  Service Category: E/M  Provider: Gaspar Cola, MD  DOB: [birth year]  DOS: 03/08/2020  Specialty: Interventional Pain Management  MRN: [id number]  Setting: Ambulatory outpatient  PCP: Pleas Koch, NP  Type: Established Patient    Referring Provider: Pleas Koch, NP  Location: Office  Delivery: Face-to-face     HPI  Mr. James House., a 49 y.o. year old male, is here today because of his Chronic pain syndrome [G89.4]. Mr. James House primary complain today is Back Pain (lumbar bilateral) and Leg Pain (peripheral neuropathy ) Last encounter: My last encounter with him was on Visit date not found. Pertinent problems: Mr. James House has Multiple myeloma ([place]); Chronic low back pain (Primary Area of Pain) (Bilateral) (L>R) w/ sciatica (Bilateral); Chronic lower extremity pain (Secondary Area of Pain) (Bilateral) (L>R); Chronic knee pain (Tertiary Area of Pain) (Bilateral) (L>R); Chronic pain syndrome; Chemotherapy-induced peripheral neuropathy ([place]); Neurogenic pain; Neuropathic pain; History of Partial knee replacement (Left); Chronic knee pain s/p partial replacement (Left); Osteoarthritis of knee (Right); Chronic hip pain (Bilateral) (R>L); Chronic sacroiliac joint pain (Bilateral) (R>L); Other specified dorsopathies, sacral and sacrococcygeal region; Lumbar facet syndrome (Bilateral) (L>R); Spondylosis without myelopathy or radiculopathy, lumbar region; Chronic lumbar radiculopathy (by EMG/PNCV) (L5) (Right); Chronic low back pain  (Primary Area of Pain) (Bilateral) (L>R) w/o sciatica; Spasm of back muscles; Chronic musculoskeletal pain; Osteoarthritis of hips (Bilateral); Piriformis syndrome (Left); Piriformis muscle pain (Left); Spasm of piriformis muscle (Left); DDD (degenerative disc disease), lumbosacral; Shingles; Thoracic radiculitis (Right); DDD (degenerative disc disease), thoracic; and Non-traumatic compression fracture of T7 thoracic vertebra, sequela on their pertinent problem list. Pain Assessment: Severity of Chronic pain is reported as a 8 /10. Location: Back Lower, Left, Right/into both legs. Onset: More than a month ago. Quality: Discomfort, Constant, Nagging. Timing: Constant. Modifying factor(s): medications help. Vitals:  height is '5\' 7"'  (1.702 m) and weight is 157 lb (71.2 kg). His temporal temperature is 97.4 F (36.3 C) (abnormal). His blood pressure is 155/104 (abnormal) and his pulse is 90. His respiration is 16 and oxygen saturation is 100%.   Reason for encounter: medication management.  The patient indicates doing well with the current medication regimen. No adverse reactions or side effects reported to the medications.   RTCB: 06/06/2020  Nonopioids transferred 01/31/2020: Lyrica and baclofen  Pharmacotherapy Assessment   Analgesic: Oxycodone IR 5 mg, 1 tab PO BID (10 mg/day of oxycodone) MME/day: 15 mg/day.   Monitoring: [place] PMP: PDMP reviewed during this encounter.       Pharmacotherapy: No side-effects or adverse reactions reported. Compliance: No problems identified. Effectiveness: Clinically acceptable.  Janett Billow, RN  03/08/2020  8:24 AM  Sign when Signing Visit Nursing Pain Medication Assessment:  Safety precautions to be maintained throughout the outpatient stay will include: orient to surroundings, keep bed in low position, maintain call bell within reach at all times, provide assistance with transfer out of bed and ambulation.  Medication Inspection Compliance: Pill count  conducted under aseptic conditions, in front of the patient. Neither the pills nor the bottle was removed from the patient's sight at any time. Once count  was completed pills were immediately returned to the patient in their original bottle.  Medication: Oxycodone IR Pill/Patch Count: 8 of 90 pills remain Pill/Patch Appearance: Markings consistent with prescribed medication Bottle Appearance: Standard pharmacy container. Clearly labeled. Filled Date: 10 / 20 / 2021 Last Medication intake:  Today    UDS:  Summary  Date Value Ref Range Status  04/02/2018 FINAL  Final    Comment:    ==================================================================== TOXASSURE SELECT 13 (MW) ==================================================================== Test                             Result       Flag       Units Drug Present and Declared for Prescription Verification   Oxycodone                      138          EXPECTED   ng/mg creat   Oxymorphone                    52           EXPECTED   ng/mg creat   Noroxycodone                   135          EXPECTED   ng/mg creat    Sources of oxycodone include scheduled prescription medications.    Oxymorphone and noroxycodone are expected metabolites of    oxycodone. Oxymorphone is also available as a scheduled    prescription medication. ==================================================================== Test                      Result    Flag   Units      Ref Range   Creatinine              168              mg/dL      >=20 ==================================================================== Declared Medications:  The flagging and interpretation on this report are based on the  following declared medications.  Unexpected results may arise from  inaccuracies in the declared medications.  **Note: The testing scope of this panel includes these medications:  Oxycodone  **Note: The testing scope of this panel does not include following  reported  medications:  Acetaminophen  Acyclovir  Amoxicillin (Augmentin)  Aspirin (Aspirin 81)  Baclofen  Clavulinate (Augmentin)  Gabapentin  Lenalidomide  Meloxicam  Pantoprazole  Prednisone  Pregabalin  Promethazine  Vitamin D2 (Ergocalciferol) ==================================================================== For clinical consultation, please call [phone]. ====================================================================      ROS  Constitutional: Denies any fever or chills Gastrointestinal: No reported hemesis, hematochezia, vomiting, or acute GI distress Musculoskeletal: Denies any acute onset joint swelling, redness, loss of ROM, or weakness Neurological: No reported episodes of acute onset apraxia, aphasia, dysarthria, agnosia, amnesia, paralysis, loss of coordination, or loss of consciousness  Medication Review  aspirin EC, baclofen, lenalidomide, oxyCODONE, pregabalin, and sertraline  History Review  Allergy: Mr. James House has No Known Allergies. Drug: Mr. James House  reports no history of drug use. Alcohol:  reports no history of alcohol use. Tobacco:  reports that he quit smoking about 6 years ago. His smokeless tobacco use includes snuff. Social: Mr. James House  reports that he quit smoking about 6 years ago. His smokeless tobacco use includes snuff. He reports that he does not drink alcohol and  does not use drugs. Medical:  has a past medical history of CKD (chronic kidney disease) stage 3, GFR 30-59 ml/min ([place]), Hypertension, Multiple myeloma ([place]) (08/11/2017), Neuropathy, and Pneumonia. Surgical: Mr. James House  has a past surgical history that includes Joint replacement; Knee surgery (Left); and Abdominal surgery. Family: family history includes COPD in his maternal grandmother and mother; Cancer in his maternal uncle; Cancer (age of onset: 36) in his maternal grandmother; Cancer (age of onset: 21) in his mother; Diabetes in his maternal grandmother and mother; Hyperlipidemia  in his mother; Hypertension in his maternal grandmother and mother.  Laboratory Chemistry Profile   Renal Lab Results  Component Value Date   BUN 17 12/31/2019   CREATININE 1.37 (H) 12/31/2019   BCR 7 (L) 11/26/2017   GFRAA >60 12/31/2019   GFRNONAA >60 12/31/2019     Hepatic Lab Results  Component Value Date   AST 20 12/31/2019   ALT 16 12/31/2019   ALBUMIN 3.9 12/31/2019   ALKPHOS 60 12/31/2019   LIPASE 52 (H) 12/22/2017     Electrolytes Lab Results  Component Value Date   NA 136 12/31/2019   K 3.6 12/31/2019   CL 103 12/31/2019   CALCIUM 8.1 (L) 12/31/2019   MG 2.4 (H) 11/26/2017     Bone Lab Results  Component Value Date   25OHVITD1 41 11/26/2017   25OHVITD2 4.4 11/26/2017   25OHVITD3 37 11/26/2017   TESTOSTERONE 570 12/29/2017     Inflammation (CRP: Acute Phase) (ESR: Chronic Phase) Lab Results  Component Value Date   CRP 9 11/26/2017   ESRSEDRATE 16 (H) 11/26/2017   LATICACIDVEN 1.1 08/11/2017       Note: Above Lab results reviewed.  Recent Imaging Review  DG Bone Survey Met CLINICAL DATA:  Multiple myeloma, currently in remission.  EXAM: METASTATIC BONE SURVEY  COMPARISON:  CT scan 12/22/2017  FINDINGS: The lateral skull film demonstrates numerous small lytic myelomatous lesions.  A few small scattered lytic myelomatous lesions are suspected in the clavicles and proximal humeri bilaterally.  Do not see any obvious lesions involving the spine or ribs. Remote healed rib fractures are noted.  No definite lesions involving the bony pelvis or lower extremities.  IMPRESSION: 1. Suspect small lytic myelomatous lesions involving the skull, clavicles and bilateral proximal humeri. 2. No other definite lesions are identified.  Electronically Signed   By: Marijo Sanes M.D.   On: 10/22/2018 15:51 Note: Reviewed        Physical Exam  General appearance: Well nourished, well developed, and well hydrated. In no apparent acute  distress Mental status: Alert, oriented x 3 (person, place, & time)       Respiratory: No evidence of acute respiratory distress Eyes: PERLA Vitals: BP (!) 155/104 (BP Location: Right Arm, Patient Position: Sitting, Cuff Size: Normal)    Pulse 90    Temp (!) 97.4 F (36.3 C) (Temporal)    Resp 16    Ht '5\' 7"'  (1.702 m)    Wt 157 lb (71.2 kg)    SpO2 100%    BMI 24.59 kg/m  BMI: Estimated body mass index is 24.59 kg/m as calculated from the following:   Height as of this encounter: '5\' 7"'  (1.702 m).   Weight as of this encounter: 157 lb (71.2 kg). Ideal: Ideal body weight: 66.1 kg (145 lb 11.6 oz) Adjusted ideal body weight: 68.1 kg (150 lb 3.8 oz)  Assessment   Status Diagnosis  Controlled Controlled Controlled 1. Chronic pain syndrome  2. Chronic low back pain (Primary Area of Pain) (Bilateral) (L>R) w/o sciatica   3. Multiple myeloma, remission status unspecified ([place])   4. Non-traumatic compression fracture of T7 thoracic vertebra, sequela   5. Thoracic radiculitis (Right)   6. Chemotherapy-induced peripheral neuropathy ([place])   7. Pharmacologic therapy   8. Uncomplicated opioid dependence ([place])   9. Neuropathic pain   10. Neurogenic pain      Updated Problems: Problem  Uncomplicated Opioid Dependence ([facility])    Plan of Care  Problem-specific:  No problem-specific Assessment & Plan notes found for this encounter.  Mr. Mordechai James House. has a current medication list which includes the following long-term medication(s): baclofen, oxycodone, [START ON 04/07/2020] oxycodone, [START ON 05/07/2020] oxycodone, pregabalin, and sertraline.  Pharmacotherapy (Medications Ordered): Meds ordered this encounter  Medications   oxyCODONE (OXY IR/ROXICODONE) 5 MG immediate release tablet    Sig: Take 1 tablet (5 mg total) by mouth every 8 (eight) hours as needed for severe pain. Must last 30 days    Dispense:  90 tablet    Refill:  0    Chronic Pain: STOP Act (Not applicable) Fill 1 day  early if closed on refill date. Avoid benzodiazepines within 8 hours of opioids   oxyCODONE (OXY IR/ROXICODONE) 5 MG immediate release tablet    Sig: Take 1 tablet (5 mg total) by mouth every 8 (eight) hours as needed for severe pain. Must last 30 days    Dispense:  90 tablet    Refill:  0    Chronic Pain: STOP Act (Not applicable) Fill 1 day early if closed on refill date. Avoid benzodiazepines within 8 hours of opioids   oxyCODONE (OXY IR/ROXICODONE) 5 MG immediate release tablet    Sig: Take 1 tablet (5 mg total) by mouth every 8 (eight) hours as needed for severe pain. Must last 30 days    Dispense:  90 tablet    Refill:  0    Chronic Pain: STOP Act (Not applicable) Fill 1 day early if closed on refill date. Avoid benzodiazepines within 8 hours of opioids   Orders:  Orders Placed This Encounter  Procedures   ToxASSURE Select 13 (MW), Urine    Volume: 30 ml(s). Minimum 3 ml of urine is needed. Document temperature of fresh sample. Indications: Long term (current) use of opiate analgesic (Z22.482)    Order Specific Question:   Release to patient    Answer:   Immediate   Follow-up plan:   Return in about 3 months (around 06/06/2020) for (F2F), (Med Mgmt).      Interventional management options:  Considering:   DiagnosticbilateralLESI Possible bilateral lumbar facet RFA Diagnostic bilateral sacroiliac joint block Possible bilateral sacroiliac joint RFA Diagnostic right knee Hyalgan series Diagnosticright knee intra-articular knee injection Diagnostic left knee genicular nerve block   Palliative PRN treatment(s):   Palliative bilateral IA hip joint injection #2 (50/50/50/50) Palliative bilateral lumbar facet block #3 (100/100/0) (60/60/60) Palliative left piriformis muscle injection #2 (100/100/0)     Recent Visits Date Type Provider Dept  01/31/20 Telemedicine Milinda Pointer, MD [facility]  Showing recent visits within past 90 days and meeting all  other requirements Today's Visits Date Type Provider Dept  03/08/20 Office Visit Milinda Pointer, MD [facility]  Showing today's visits and meeting all other requirements Future Appointments Date Type Provider Dept  06/05/20 Appointment Milinda Pointer, MD [facility]  Showing future appointments within next 90 days and meeting all other requirements  I discussed the assessment and treatment plan with the patient. The patient was provided an opportunity to ask questions and all were answered. The patient agreed with the plan and demonstrated an understanding of the instructions.  Patient advised to call back or seek an in-person evaluation if the symptoms or condition worsens.  Duration of encounter: 30 minutes.  Note by: Gaspar Cola, MD Date: 03/08/2020; Time: 8:56 AM

## 2020-03-08 ENCOUNTER — Encounter: Payer: Self-pay | Admitting: Pain Medicine

## 2020-03-08 ENCOUNTER — Other Ambulatory Visit: Payer: Self-pay

## 2020-03-08 ENCOUNTER — Ambulatory Visit: Payer: Self-pay | Attending: Pain Medicine | Admitting: Pain Medicine

## 2020-03-08 ENCOUNTER — Other Ambulatory Visit: Payer: Self-pay | Admitting: Pain Medicine

## 2020-03-08 VITALS — BP 155/104 | HR 90 | Temp 97.4°F | Resp 16 | Ht 67.0 in | Wt 157.0 lb

## 2020-03-08 DIAGNOSIS — G894 Chronic pain syndrome: Secondary | ICD-10-CM | POA: Insufficient documentation

## 2020-03-08 DIAGNOSIS — C9000 Multiple myeloma not having achieved remission: Secondary | ICD-10-CM | POA: Insufficient documentation

## 2020-03-08 DIAGNOSIS — G620 Drug-induced polyneuropathy: Secondary | ICD-10-CM | POA: Insufficient documentation

## 2020-03-08 DIAGNOSIS — M792 Neuralgia and neuritis, unspecified: Secondary | ICD-10-CM | POA: Insufficient documentation

## 2020-03-08 DIAGNOSIS — M4854XS Collapsed vertebra, not elsewhere classified, thoracic region, sequela of fracture: Secondary | ICD-10-CM | POA: Insufficient documentation

## 2020-03-08 DIAGNOSIS — G8929 Other chronic pain: Secondary | ICD-10-CM | POA: Insufficient documentation

## 2020-03-08 DIAGNOSIS — M545 Low back pain, unspecified: Secondary | ICD-10-CM | POA: Insufficient documentation

## 2020-03-08 DIAGNOSIS — M5414 Radiculopathy, thoracic region: Secondary | ICD-10-CM | POA: Insufficient documentation

## 2020-03-08 DIAGNOSIS — F1120 Opioid dependence, uncomplicated: Secondary | ICD-10-CM | POA: Insufficient documentation

## 2020-03-08 DIAGNOSIS — Z79899 Other long term (current) drug therapy: Secondary | ICD-10-CM | POA: Insufficient documentation

## 2020-03-08 DIAGNOSIS — T451X5A Adverse effect of antineoplastic and immunosuppressive drugs, initial encounter: Secondary | ICD-10-CM | POA: Insufficient documentation

## 2020-03-08 MED ORDER — OXYCODONE HCL 5 MG PO TABS: 5.0000 mg | ORAL_TABLET | Freq: Three times a day (TID) | ORAL | 0 refills | Status: DC | PRN

## 2020-03-08 NOTE — Progress Notes (Signed)
Nursing Pain Medication Assessment:  Safety precautions to be maintained throughout the outpatient stay will include: orient to surroundings, keep bed in low position, maintain call bell within reach at all times, provide assistance with transfer out of bed and ambulation.  Medication Inspection Compliance: Pill count conducted under aseptic conditions, in front of the patient. Neither the pills nor the bottle was removed from the patient's sight at any time. Once count was completed pills were immediately returned to the patient in their original bottle.  Medication: Oxycodone IR Pill/Patch Count: 8 of 90 pills remain Pill/Patch Appearance: Markings consistent with prescribed medication Bottle Appearance: Standard pharmacy container. Clearly labeled. Filled Date: 10 / 20 / 2021 Last Medication intake:  Today

## 2020-03-08 NOTE — Patient Instructions (Signed)
____________________________________________________________________________________________  CBD (cannabidiol) WARNING  Applicable to: All individuals currently taking or considering taking CBD (cannabidiol) and, more important, all patients taking opioid analgesic controlled substances (pain medication). (Example: oxycodone; oxymorphone; hydrocodone; hydromorphone; morphine; methadone; tramadol; tapentadol; fentanyl; buprenorphine; butorphanol; dextromethorphan; meperidine; codeine; etc.)  Legal status: CBD remains a Schedule I drug prohibited for any use. CBD is illegal with one exception. In the [place], CBD has a limited Food and Drug Administration (FDA) approval for the treatment of two specific types of epilepsy disorders. Only one CBD product has been approved by the FDA for this purpose: "Epidiolex". FDA is aware that some companies are marketing products containing cannabis and cannabis-derived compounds in ways that violate the Federal Food, Drug and Cosmetic Act (FD&C Act) and that may put the health and safety of consumers at risk. The FDA, a Federal agency, has not enforced the CBD status since 2018.   Legality: Some manufacturers ship CBD products nationally, which is illegal. Often such products are sold online and are therefore available throughout the country. CBD is openly sold in head shops and health food stores in some states where such sales have not been explicitly legalized. Selling unapproved products with unsubstantiated therapeutic claims is not only a violation of the law, but also can put patients at risk, as these products have not been proven to be safe or effective. Federal illegality makes it difficult to conduct research on CBD.  Reference: "FDA Regulation of Cannabis and Cannabis-Derived Products, Including Cannabidiol (CBD)" -  https://www.fda.gov/news-events/public-health-focus/fda-regulation-cannabis-and-cannabis-derived-products-including-cannabidiol-cbd  Warning: CBD is not FDA approved and has not undergo the same manufacturing controls as prescription drugs.  This means that the purity and safety of available CBD may be questionable. Most of the time, despite manufacturer's claims, it is contaminated with THC (delta-9-tetrahydrocannabinol - the chemical in marijuana responsible for the "HIGH").  When this is the case, the THC contaminant will trigger a positive urine drug screen (UDS) test for Marijuana (carboxy-THC). Because a positive UDS for any illicit substance is a violation of our medication agreement, your opioid analgesics (pain medicine) may be permanently discontinued.  MORE ABOUT CBD  General Information: CBD  is a derivative of the Marijuana (cannabis sativa) plant discovered in 1940. It is one of the 113 identified substances found in Marijuana. It accounts for up to 40% of the plant's extract. As of 2018, preliminary clinical studies on CBD included research for the treatment of anxiety, movement disorders, and pain. CBD is available and consumed in multiple forms, including inhalation of smoke or vapor, as an aerosol spray, and by mouth. It may be supplied as an oil containing CBD, capsules, dried cannabis, or as a liquid solution. CBD is thought not to be as psychoactive as THC (delta-9-tetrahydrocannabinol - the chemical in marijuana responsible for the "HIGH"). Studies suggest that CBD may interact with different biological target receptors in the body, including cannabinoid and other neurotransmitter receptors. As of 2018 the mechanism of action for its biological effects has not been determined.  Side-effects  Adverse reactions: Dry mouth, diarrhea, decreased appetite, fatigue, drowsiness, malaise, weakness, sleep disturbances, and others.  Drug interactions: CBC may interact with other medications  such as blood-thinners. (Last update: 11/27/2019) ____________________________________________________________________________________________   ____________________________________________________________________________________________  Medication Rules  Purpose: To inform patients, and their family members, of our rules and regulations.  Applies to: All patients receiving prescriptions (written or electronic).  Pharmacy of record: Pharmacy where electronic prescriptions will be sent. If written prescriptions are taken to a different pharmacy, please inform   the nursing staff. The pharmacy listed in the electronic medical record should be the one where you would like electronic prescriptions to be sent.  Electronic prescriptions: In compliance with the [place] Opioid Misuse Prevention (STOP) Act of 2017 (Session Law 2017-74/H243), effective April 22, 2018, all controlled substances must be electronically prescribed. Calling prescriptions to the pharmacy will cease to exist.  Prescription refills: Only during scheduled appointments. Applies to all prescriptions.  NOTE: The following applies primarily to controlled substances (Opioid* Pain Medications).   Type of encounter (visit): For patients receiving controlled substances, face-to-face visits are required. (Not an option or up to the patient.)  Patient's responsibilities: 1. Pain Pills: Bring all pain pills to every appointment (except for procedure appointments). 2. Pill Bottles: Bring pills in original pharmacy bottle. Always bring the newest bottle. Bring bottle, even if empty. 3. Medication refills: You are responsible for knowing and keeping track of what medications you take and those you need refilled. The day before your appointment: write a list of all prescriptions that need to be refilled. The day of the appointment: give the list to the admitting nurse. Prescriptions will be written only during  appointments. No prescriptions will be written on procedure days. If you forget a medication: it will not be "Called in", "Faxed", or "electronically sent". You will need to get another appointment to get these prescribed. No early refills. Do not call asking to have your prescription filled early. 4. Prescription Accuracy: You are responsible for carefully inspecting your prescriptions before leaving our office. Have the discharge nurse carefully go over each prescription with you, before taking them home. Make sure that your name is accurately spelled, that your address is correct. Check the name and dose of your medication to make sure it is accurate. Check the number of pills, and the written instructions to make sure they are clear and accurate. Make sure that you are given enough medication to last until your next medication refill appointment. 5. Taking Medication: Take medication as prescribed. When it comes to controlled substances, taking less pills or less frequently than prescribed is permitted and encouraged. Never take more pills than instructed. Never take medication more frequently than prescribed.  6. Inform other Doctors: Always inform, all of your healthcare providers, of all the medications you take. 7. Pain Medication from other Providers: You are not allowed to accept any additional pain medication from any other Doctor or Healthcare provider. There are two exceptions to this rule. (see below) In the event that you require additional pain medication, you are responsible for notifying us, as stated below. 8. Medication Agreement: You are responsible for carefully reading and following our Medication Agreement. This must be signed before receiving any prescriptions from our practice. Safely store a copy of your signed Agreement. Violations to the Agreement will result in no further prescriptions. (Additional copies of our Medication Agreement are available upon request.) 9. Laws, Rules,  & Regulations: All patients are expected to follow all Federal and State Laws, Statutes, Rules, & Regulations. Ignorance of the Laws does not constitute a valid excuse.  10. Illegal drugs and Controlled Substances: The use of illegal substances (including, but not limited to marijuana and its derivatives) and/or the illegal use of any controlled substances is strictly prohibited. Violation of this rule may result in the immediate and permanent discontinuation of any and all prescriptions being written by our practice. The use of any illegal substances is prohibited. 11. Adopted CDC guidelines & recommendations: Target dosing   levels will be at or below 60 MME/day. Use of benzodiazepines** is not recommended.  Exceptions: There are only two exceptions to the rule of not receiving pain medications from other Healthcare Providers. 1. Exception #1 (Emergencies): In the event of an emergency (i.e.: accident requiring emergency care), you are allowed to receive additional pain medication. However, you are responsible for: As soon as you are able, call our office [phone], at any time of the day or night, and leave a message stating your name, the date and nature of the emergency, and the name and dose of the medication prescribed. In the event that your call is answered by a member of our staff, make sure to document and save the date, time, and the name of the person that took your information.  2. Exception #2 (Planned Surgery): In the event that you are scheduled by another doctor or dentist to have any type of surgery or procedure, you are allowed (for a period no longer than 30 days), to receive additional pain medication, for the acute post-op pain. However, in this case, you are responsible for picking up a copy of our "Post-op Pain Management for Surgeons" handout, and giving it to your surgeon or dentist. This document is available at our office, and does not require an appointment to obtain it. Simply  go to our office during business hours (Monday-Thursday from 8:00 AM to 4:00 PM) (Friday 8:00 AM to 12:00 Noon) or if you have a scheduled appointment with us, prior to your surgery, and ask for it by name. In addition, you are responsible for: calling our office [phone], at any time of the day or night, and leaving a message stating your name, name of your surgeon, type of surgery, and date of procedure or surgery. Failure to comply with your responsibilities may result in termination of therapy involving the controlled substances.  *Opioid medications include: morphine, codeine, oxycodone, oxymorphone, hydrocodone, hydromorphone, meperidine, tramadol, tapentadol, buprenorphine, fentanyl, methadone. **Benzodiazepine medications include: diazepam (Valium), alprazolam (Xanax), clonazepam (Klonopine), lorazepam (Ativan), clorazepate (Tranxene), chlordiazepoxide (Librium), estazolam (Prosom), oxazepam (Serax), temazepam (Restoril), triazolam (Halcion) (Last updated: 12/28/2019) ____________________________________________________________________________________________   ____________________________________________________________________________________________  Medication Recommendations and Reminders  Applies to: All patients receiving prescriptions (written and/or electronic).  Medication Rules & Regulations: These rules and regulations exist for your safety and that of others. They are not flexible and neither are we. Dismissing or ignoring them will be considered "non-compliance" with medication therapy, resulting in complete and irreversible termination of such therapy. (See document titled "Medication Rules" for more details.) In all conscience, because of safety reasons, we cannot continue providing a therapy where the patient does not follow instructions.  Pharmacy of record:   Definition: This is the pharmacy where your electronic prescriptions will be sent.   We do not endorse any  particular pharmacy, however, we have experienced problems with [facility] not securing enough medication supply for the community.  We do not restrict you in your choice of pharmacy. However, once we write for your prescriptions, we will NOT be re-sending more prescriptions to fix restricted supply problems created by your pharmacy, or your insurance.   The pharmacy listed in the electronic medical record should be the one where you want electronic prescriptions to be sent.  If you choose to change pharmacy, simply notify our nursing staff.  Recommendations:  Keep all of your pain medications in a safe place, under lock and key, even if you live alone. We will NOT replace lost, stolen, or   damaged medication.  After you fill your prescription, take 1 week's worth of pills and put them away in a safe place. You should keep a separate, properly labeled bottle for this purpose. The remainder should be kept in the original bottle. Use this as your primary supply, until it runs out. Once it's gone, then you know that you have 1 week's worth of medicine, and it is time to come in for a prescription refill. If you do this correctly, it is unlikely that you will ever run out of medicine.  To make sure that the above recommendation works, it is very important that you make sure your medication refill appointments are scheduled at least 1 week before you run out of medicine. To do this in an effective manner, make sure that you do not leave the office without scheduling your next medication management appointment. Always ask the nursing staff to show you in your prescription , when your medication will be running out. Then arrange for the receptionist to get you a return appointment, at least 7 days before you run out of medicine. Do not wait until you have 1 or 2 pills left, to come in. This is very poor planning and does not take into consideration that we may need to cancel appointments due to bad weather,  sickness, or emergencies affecting our staff.  DO NOT ACCEPT A "Partial Fill": If for any reason your pharmacy does not have enough pills/tablets to completely fill or refill your prescription, do not allow for a "partial fill". The law allows the pharmacy to complete that prescription within 72 hours, without requiring a new prescription. If they do not fill the rest of your prescription within those 72 hours, you will need a separate prescription to fill the remaining amount, which we will NOT provide. If the reason for the partial fill is your insurance, you will need to talk to the pharmacist about payment alternatives for the remaining tablets, but again, DO NOT ACCEPT A PARTIAL FILL, unless you can trust your pharmacist to obtain the remainder of the pills within 72 hours.  Prescription refills and/or changes in medication(s):   Prescription refills, and/or changes in dose or medication, will be conducted only during scheduled medication management appointments. (Applies to both, written and electronic prescriptions.)  No refills on procedure days. No medication will be changed or started on procedure days. No changes, adjustments, and/or refills will be conducted on a procedure day. Doing so will interfere with the diagnostic portion of the procedure.  No phone refills. No medications will be "called into the pharmacy".  No Fax refills.  No weekend refills.  No Holliday refills.  No after hours refills.  Remember:  Business hours are:  Monday to Thursday 8:00 AM to 4:00 PM Provider's Schedule: Tenia Goh, MD - Appointments are:  Medication management: Monday and Wednesday 8:00 AM to 4:00 PM Procedure day: Tuesday and Thursday 7:30 AM to 4:00 PM Bilal Lateef, MD - Appointments are:  Medication management: Tuesday and Thursday 8:00 AM to 4:00 PM Procedure day: Monday and Wednesday 7:30 AM to 4:00 PM (Last update:  11/10/2019) ____________________________________________________________________________________________    

## 2020-03-10 ENCOUNTER — Other Ambulatory Visit: Payer: Self-pay | Admitting: *Deleted

## 2020-03-10 MED ORDER — LENALIDOMIDE 5 MG PO CAPS
5.0000 mg | ORAL_CAPSULE | Freq: Every day | ORAL | 0 refills | Status: DC
Start: 2020-03-10 — End: 2020-04-04

## 2020-03-10 MED FILL — oxyCODONE HCL 5 MG TABS: 5 | 30 days supply | Qty: 90 | Fill #0

## 2020-03-15 LAB — TOXASSURE SELECT 13 (MW), URINE

## 2020-03-15 MED FILL — BACLOFEN 10 MG TABS: 10 | 90 days supply | Qty: 360 | Fill #0

## 2020-03-23 MED FILL — SERTRALINE HCL 25 MG TABLET: 25 | 30 days supply | Qty: 30 | Fill #1

## 2020-03-29 ENCOUNTER — Inpatient Hospital Stay: Payer: Self-pay | Attending: Oncology

## 2020-03-29 DIAGNOSIS — C9001 Multiple myeloma in remission: Secondary | ICD-10-CM | POA: Insufficient documentation

## 2020-03-29 LAB — COMPREHENSIVE METABOLIC PANEL
ALT: 17 U/L (ref 0–44)
AST: 24 U/L (ref 15–41)
Albumin: 4.2 g/dL (ref 3.5–5.0)
Alkaline Phosphatase: 57 U/L (ref 38–126)
Anion gap: 9 (ref 5–15)
BUN: 24 mg/dL — ABNORMAL HIGH (ref 6–20)
CO2: 28 mmol/L (ref 22–32)
Calcium: 9.1 mg/dL (ref 8.9–10.3)
Chloride: 101 mmol/L (ref 98–111)
Creatinine, Ser: 1.55 mg/dL — ABNORMAL HIGH (ref 0.61–1.24)
GFR, Estimated: 55 mL/min — ABNORMAL LOW (ref >60)
Glucose, Bld: 110 mg/dL — ABNORMAL HIGH (ref 70–99)
Potassium: 3.5 mmol/L (ref 3.5–5.1)
Sodium: 138 mmol/L (ref 135–145)
Total Bilirubin: 0.6 mg/dL (ref 0.3–1.2)
Total Protein: 6.7 g/dL (ref 6.5–8.1)

## 2020-03-29 LAB — CBC WITH DIFFERENTIAL/PLATELET
Abs Immature Granulocytes: 0.01 10*3/uL (ref 0.00–0.07)
Basophils Absolute: 0 10*3/uL (ref 0.0–0.1)
Basophils Relative: 1 %
Eosinophils Absolute: 0.1 10*3/uL (ref 0.0–0.5)
Eosinophils Relative: 3 %
HCT: 43.4 % (ref 39.0–52.0)
Hemoglobin: 15.5 g/dL (ref 13.0–17.0)
Immature Granulocytes: 0 %
Lymphocytes Relative: 16 %
Lymphs Abs: 0.9 10*3/uL (ref 0.7–4.0)
MCH: 32.9 pg (ref 26.0–34.0)
MCHC: 35.7 g/dL (ref 30.0–36.0)
MCV: 92.1 fL (ref 80.0–100.0)
Monocytes Absolute: 0.4 10*3/uL (ref 0.1–1.0)
Monocytes Relative: 8 %
Neutro Abs: 4.1 10*3/uL (ref 1.7–7.7)
Neutrophils Relative %: 72 %
Platelets: 195 10*3/uL (ref 150–400)
RBC: 4.71 MIL/uL (ref 4.22–5.81)
RDW: 12.7 % (ref 11.5–15.5)
WBC: 5.6 10*3/uL (ref 4.0–10.5)
nRBC: 0 % (ref 0.0–0.2)

## 2020-03-30 LAB — KAPPA/LAMBDA LIGHT CHAINS
Kappa free light chain: 15 mg/L (ref 3.3–19.4)
Kappa, lambda light chain ratio: 1.47 (ref 0.26–1.65)
Lambda free light chains: 10.2 mg/L (ref 5.7–26.3)

## 2020-03-31 ENCOUNTER — Encounter: Payer: Self-pay | Admitting: *Deleted

## 2020-03-31 ENCOUNTER — Inpatient Hospital Stay (HOSPITAL_BASED_OUTPATIENT_CLINIC_OR_DEPARTMENT_OTHER): Payer: Self-pay | Admitting: Oncology

## 2020-03-31 DIAGNOSIS — Z79899 Other long term (current) drug therapy: Secondary | ICD-10-CM

## 2020-03-31 DIAGNOSIS — C9001 Multiple myeloma in remission: Secondary | ICD-10-CM

## 2020-03-31 LAB — MULTIPLE MYELOMA PANEL, SERUM
Albumin SerPl Elph-Mcnc: 3.6 g/dL (ref 2.9–4.4)
Albumin/Glob SerPl: 1.5 (ref 0.7–1.7)
Alpha 1: 0.2 g/dL (ref 0.0–0.4)
Alpha2 Glob SerPl Elph-Mcnc: 0.6 g/dL (ref 0.4–1.0)
B-Globulin SerPl Elph-Mcnc: 1 g/dL (ref 0.7–1.3)
Gamma Glob SerPl Elph-Mcnc: 0.8 g/dL (ref 0.4–1.8)
Globulin, Total: 2.5 g/dL (ref 2.2–3.9)
IgA: 167 mg/dL (ref 90–386)
IgG (Immunoglobin G), Serum: 795 mg/dL (ref 603–1613)
IgM (Immunoglobulin M), Srm: 45 mg/dL (ref 20–172)
Total Protein ELP: 6.1 g/dL (ref 6.0–8.5)

## 2020-04-04 ENCOUNTER — Telehealth: Payer: Self-pay | Admitting: *Deleted

## 2020-04-04 MED ORDER — LENALIDOMIDE 5 MG PO CAPS
5.0000 mg | ORAL_CAPSULE | Freq: Every day | ORAL | 0 refills | Status: DC
Start: 2020-04-04 — End: 2020-07-14

## 2020-04-04 MED FILL — PREGABALIN 150 MG CAPS: 150 | 90 days supply | Qty: 270 | Fill #0

## 2020-04-04 NOTE — Telephone Encounter (Signed)
Refill for revlimid

## 2020-04-07 NOTE — Progress Notes (Signed)
I connected with James House on 04/07/20 at  2:45 PM EST by video enabled telemedicine visit and verified that I am speaking with the correct person using two identifiers.   I discussed the limitations, risks, security and privacy concerns of performing an evaluation and management service by telemedicine and the availability of in-person appointments. I also discussed with the patient that there may be a patient responsible charge related to this service. The patient expressed understanding and agreed to proceed.  We did work on activity it was switched to a telephone call  Other persons participating in the visit and their role in the encounter:  none  Patient's location:  work Provider's location:  Work  Diagnosis- light chain kappa multiple myeloma s/p ASCT currently in remission. Patient on maintenance revlimid   Chief Complaint: Routine follow-up of multiple myeloma in remission  History of present illness: Patient is a 49 year old male with a history of kappa light chain myeloma that was treated by [name]. History is as follows:  1. Patient presented with renal insufficiency with a creatinine of 2.4 in April 2018. Kidney biopsy showed myeloma cast nephropathy. Kappa light chain was 3004 and lambda 0.22 with a kappa lambda ratio of 13,000 655. Skeletal survey showed multiple calvarial and marrow lesions. Bone marrow biopsy in May 2018 showed 43% plasma cells. He received CyBorDfor cycle 1 in May 2018 and was subsequently switched to RVD. He received 4 cycles followed by a repeat bone marrow biopsy in August 2018 which showed no increase in plasma cells.  2.He underwent autologous stem cell transplantation on 01/30/2017. He was started on Revlimid maintenance 10 mg daily in January 2019.  3.Labs from January February and March 2019 done at [facility] showed no M spike, normal kappa lambda ratio of 1.06. He was last seen by them in April 2019.  Following that patient  was admitted to [facility] for healthcare associated pneumonia and was recently discharged. He wished to transfer his care to [place] at this time.  With regards to his myeloma patient is currently on 10 mg of Revlimid daily. He is also on acyclovir 400 mg twice daily which he is to continue for a minimum of 1 year post transplant. Patient has chronic back pain as well as chemo-induced peripheral neuropathy for which he was seen by [name] in the past and is currently seeing [facility] pain clinic and is under pain contract with them   Interval history patient continues to have problems with peripheral neuropathy for which she sees pain clinic and is currently on oxycodone Lyrica.  He takes Zoloft for his depression.  He is tolerating Revlimid 5 mg daily well without any significant side effects   Review of Systems  Constitutional: Positive for malaise/fatigue. Negative for chills, fever and weight loss.  HENT: Negative for congestion, ear discharge and nosebleeds.   Eyes: Negative for blurred vision.  Respiratory: Negative for cough, hemoptysis, sputum production, shortness of breath and wheezing.   Cardiovascular: Negative for chest pain, palpitations, orthopnea and claudication.  Gastrointestinal: Negative for abdominal pain, blood in stool, constipation, diarrhea, heartburn, melena, nausea and vomiting.  Genitourinary: Negative for dysuria, flank pain, frequency, hematuria and urgency.  Musculoskeletal: Negative for back pain, joint pain and myalgias.  Skin: Negative for rash.  Neurological: Positive for sensory change (Peripheral neuropathy). Negative for dizziness, tingling, focal weakness, seizures, weakness and headaches.  Endo/Heme/Allergies: Does not bruise/bleed easily.  Psychiatric/Behavioral: Negative for depression and suicidal ideas. The patient does not have insomnia.  No Known Allergies  Past Medical History:  Diagnosis Date  . CKD (chronic kidney disease) stage 3, GFR  30-59 ml/min (HCC)   . Hypertension   . Multiple myeloma ([place]) 08/11/2017  . Neuropathy   . Pneumonia     Past Surgical History:  Procedure Laterality Date  . ABDOMINAL SURGERY    . JOINT REPLACEMENT     right knee  . KNEE SURGERY Left     Social History   Socioeconomic History  . Marital status: Married    Spouse name: [name]  . Number of children: 3  . Years of education: Not on file  . Highest education level: Not on file  Occupational History  . Not on file  Tobacco Use  . Smoking status: Former Smoker    Quit date: 08/19/2013    Years since quitting: 6.6  . Smokeless tobacco: Current User    Types: Snuff  Vaping Use  . Vaping Use: Never used  Substance and Sexual Activity  . Alcohol use: No  . Drug use: No  . Sexual activity: Yes  Other Topics Concern  . Not on file  Social History Narrative   Live in private residence with spouse and mother in law   Social Determinants of Health   Financial Resource Strain: Not on file  Food Insecurity: Not on file  Transportation Needs: Not on file  Physical Activity: Not on file  Stress: Not on file  Social Connections: Not on file  Intimate Partner Violence: Not on file    Family History  Problem Relation Age of Onset  . Cancer Mother 32       Pancreatic  . COPD Mother   . Diabetes Mother   . Hyperlipidemia Mother   . Hypertension Mother   . Cancer Maternal Uncle        pancreatic  . Cancer Maternal Grandmother 74       colon  . COPD Maternal Grandmother   . Diabetes Maternal Grandmother   . Hypertension Maternal Grandmother      Current Outpatient Medications:  .  aspirin EC 81 MG tablet, Take 81 mg by mouth daily., Disp: , Rfl:  .  baclofen (LIORESAL) 10 MG tablet, Take 1 tablet (10 mg total) by mouth 4 (four) times daily. As needed for muscle spasms., Disp: 360 tablet, Rfl: 0 .  lenalidomide (REVLIMID) 5 MG capsule, Take 1 capsule (5 mg total) by mouth daily. [employer] Auth # [id number] done on  03/10/2020, Disp: 28 capsule, Rfl: 0 .  oxyCODONE (OXY IR/ROXICODONE) 5 MG immediate release tablet, Take 1 tablet (5 mg total) by mouth every 8 (eight) hours as needed for severe pain. Must last 30 days, Disp: 90 tablet, Rfl: 0 .  oxyCODONE (OXY IR/ROXICODONE) 5 MG immediate release tablet, Take 1 tablet (5 mg total) by mouth every 8 (eight) hours as needed for severe pain. Must last 30 days, Disp: 90 tablet, Rfl: 0 .  [START ON 05/07/2020] oxyCODONE (OXY IR/ROXICODONE) 5 MG immediate release tablet, Take 1 tablet (5 mg total) by mouth every 8 (eight) hours as needed for severe pain. Must last 30 days, Disp: 90 tablet, Rfl: 0 .  pregabalin (LYRICA) 150 MG capsule, Take 1 capsule (150 mg total) by mouth 3 (three) times daily., Disp: 270 capsule, Rfl: 0 .  sertraline (ZOLOFT) 25 MG tablet, TAKE 1 TABLET (25 MG TOTAL) BY MOUTH DAILY., Disp: 30 tablet, Rfl: 2  No results found.  No images are attached to the encounter.  CMP Latest Ref Rng & Units 03/29/2020  Glucose 70 - 99 mg/dL 110(H)  BUN 6 - 20 mg/dL 24(H)  Creatinine 0.61 - 1.24 mg/dL 1.55(H)  Sodium 135 - 145 mmol/L 138  Potassium 3.5 - 5.1 mmol/L 3.5  Chloride 98 - 111 mmol/L 101  CO2 22 - 32 mmol/L 28  Calcium 8.9 - 10.3 mg/dL 9.1  Total Protein 6.5 - 8.1 g/dL 6.7  Total Bilirubin 0.3 - 1.2 mg/dL 0.6  Alkaline Phos 38 - 126 U/L 57  AST 15 - 41 U/L 24  ALT 0 - 44 U/L 17   CBC Latest Ref Rng & Units 03/29/2020  WBC 4.0 - 10.5 K/uL 5.6  Hemoglobin 13.0 - 17.0 g/dL 15.5  Hematocrit 39.0 - 52.0 % 43.4  Platelets 150 - 400 K/uL 195    Assessment and plan: Patient is a 49 year old male with a history of light chain multiple myeloma currently in remission on maintenance Revlimid s/p autologous stem cell transplant.  This is a routine follow-up visit  Patient has stable CKD with a creatinine of 1.5.  CBC is normal.  Myeloma panel shows no M protein and serum free light chain ratio is presently normal.He will continue maintenance  Revlimid 5 mg daily.  He was previously on Xgeva which I have discontinued now due to prolonged use of more than 3 years.  I will see him back in 4 months with CBC with differential CMP myeloma panel and serum free light chains  Follow-up instructions: As above  I discussed the assessment and treatment plan with the patient. The patient was provided an opportunity to ask questions and all were answered. The patient agreed with the plan and demonstrated an understanding of the instructions.   The patient was advised to call back or seek an in-person evaluation if the symptoms worsen or if the condition fails to improve as anticipated.   Visit Diagnosis: 1. Multiple myeloma in remission ([place])   2. High risk medication use     Dr. Randa Evens, MD, MPH [facility] Tel- [phone] 04/07/2020 8:30 AM

## 2020-04-08 ENCOUNTER — Emergency Department (HOSPITAL_COMMUNITY)
Admission: EM | Admit: 2020-04-08 | Discharge: 2020-04-09 | Disposition: A | Discharge status: Home / Self Care | Payer: Self-pay | Attending: Emergency Medicine | Admitting: Emergency Medicine

## 2020-04-08 ENCOUNTER — Other Ambulatory Visit: Payer: Self-pay

## 2020-04-08 ENCOUNTER — Encounter (HOSPITAL_COMMUNITY): Payer: Self-pay | Admitting: Emergency Medicine

## 2020-04-08 DIAGNOSIS — W01190A Fall on same level from slipping, tripping and stumbling with subsequent striking against furniture, initial encounter: Secondary | ICD-10-CM | POA: Insufficient documentation

## 2020-04-08 DIAGNOSIS — Y92000 Kitchen of unspecified non-institutional (private) residence as  the place of occurrence of the external cause: Secondary | ICD-10-CM | POA: Insufficient documentation

## 2020-04-08 DIAGNOSIS — S0101XA Laceration without foreign body of scalp, initial encounter: Secondary | ICD-10-CM | POA: Insufficient documentation

## 2020-04-08 DIAGNOSIS — Y9301 Activity, walking, marching and hiking: Secondary | ICD-10-CM | POA: Insufficient documentation

## 2020-04-08 DIAGNOSIS — Z23 Encounter for immunization: Secondary | ICD-10-CM | POA: Insufficient documentation

## 2020-04-08 DIAGNOSIS — Z79899 Other long term (current) drug therapy: Secondary | ICD-10-CM | POA: Insufficient documentation

## 2020-04-08 DIAGNOSIS — Z7982 Long term (current) use of aspirin: Secondary | ICD-10-CM | POA: Insufficient documentation

## 2020-04-08 DIAGNOSIS — C9001 Multiple myeloma in remission: Secondary | ICD-10-CM | POA: Insufficient documentation

## 2020-04-08 DIAGNOSIS — G629 Polyneuropathy, unspecified: Secondary | ICD-10-CM | POA: Insufficient documentation

## 2020-04-08 DIAGNOSIS — Z96651 Presence of right artificial knee joint: Secondary | ICD-10-CM | POA: Insufficient documentation

## 2020-04-08 DIAGNOSIS — R55 Syncope and collapse: Secondary | ICD-10-CM | POA: Insufficient documentation

## 2020-04-08 DIAGNOSIS — Z87891 Personal history of nicotine dependence: Secondary | ICD-10-CM | POA: Insufficient documentation

## 2020-04-08 DIAGNOSIS — N183 Chronic kidney disease, stage 3 unspecified: Secondary | ICD-10-CM | POA: Insufficient documentation

## 2020-04-08 DIAGNOSIS — I129 Hypertensive chronic kidney disease with stage 1 through stage 4 chronic kidney disease, or unspecified chronic kidney disease: Secondary | ICD-10-CM | POA: Insufficient documentation

## 2020-04-08 LAB — CBC
HCT: 46 % (ref 39.0–52.0)
Hemoglobin: 15.8 g/dL (ref 13.0–17.0)
MCH: 33.3 pg (ref 26.0–34.0)
MCHC: 34.3 g/dL (ref 30.0–36.0)
MCV: 96.8 fL (ref 80.0–100.0)
Platelets: 168 10*3/uL (ref 150–400)
RBC: 4.75 MIL/uL (ref 4.22–5.81)
RDW: 13.2 % (ref 11.5–15.5)
WBC: 10.5 10*3/uL (ref 4.0–10.5)
nRBC: 0 % (ref 0.0–0.2)

## 2020-04-08 LAB — URINALYSIS, ROUTINE W REFLEX MICROSCOPIC
Bilirubin Urine: NEGATIVE
Glucose, UA: NEGATIVE mg/dL
Hgb urine dipstick: NEGATIVE
Ketones, ur: NEGATIVE mg/dL
Leukocytes,Ua: NEGATIVE
Nitrite: NEGATIVE
Protein, ur: NEGATIVE mg/dL
Specific Gravity, Urine: 1.017 (ref 1.005–1.030)
pH: 6 (ref 5.0–8.0)

## 2020-04-08 MED FILL — oxyCODONE HCL 5 MG TABS: 5 | 30 days supply | Qty: 90 | Fill #0

## 2020-04-08 NOTE — ED Triage Notes (Addendum)
Pt presents to ED BIB [facility]. Pt c/o syncope. Pt fell backwards and hit head on chair, laceration on back of head, bleeding controlled. Pt AAO x4, pt still dizzy. Hx of multiple myeloma and takes oral chemo.

## 2020-04-09 LAB — BASIC METABOLIC PANEL
Anion gap: 10 (ref 5–15)
BUN: 14 mg/dL (ref 6–20)
CO2: 28 mmol/L (ref 22–32)
Calcium: 9.2 mg/dL (ref 8.9–10.3)
Chloride: 104 mmol/L (ref 98–111)
Creatinine, Ser: 1.47 mg/dL — ABNORMAL HIGH (ref 0.61–1.24)
GFR, Estimated: 58 mL/min — ABNORMAL LOW (ref >60)
Glucose, Bld: 104 mg/dL — ABNORMAL HIGH (ref 70–99)
Potassium: 3.7 mmol/L (ref 3.5–5.1)
Sodium: 142 mmol/L (ref 135–145)

## 2020-04-09 MED ORDER — BACLOFEN 10 MG PO TABS
10.0000 mg | ORAL_TABLET | Freq: Once | ORAL | Status: AC
  Administered 2020-04-09: 10:00:00 10 mg via ORAL
  Filled 2020-04-09: qty 1

## 2020-04-09 MED ORDER — ACETAMINOPHEN 325 MG PO TABS
650.0000 mg | ORAL_TABLET | Freq: Once | ORAL | Status: AC
  Administered 2020-04-09: 11:00:00 650 mg via ORAL
  Filled 2020-04-09: qty 2

## 2020-04-09 MED ORDER — TETANUS-DIPHTH-ACELL PERTUSSIS 5-2.5-18.5 LF-MCG/0.5 IM SUSY
0.5000 mL | PREFILLED_SYRINGE | Freq: Once | INTRAMUSCULAR | Status: AC
  Administered 2020-04-09: 10:00:00 0.5 mL via INTRAMUSCULAR
  Filled 2020-04-09: qty 0.5

## 2020-04-09 MED ORDER — PREGABALIN 25 MG PO CAPS
150.0000 mg | ORAL_CAPSULE | Freq: Once | ORAL | Status: AC
  Administered 2020-04-09: 09:00:00 150 mg via ORAL
  Filled 2020-04-09: qty 2

## 2020-04-09 MED ORDER — OXYCODONE HCL 5 MG PO TABS
10.0000 mg | ORAL_TABLET | Freq: Once | ORAL | Status: AC
  Administered 2020-04-09: 10:00:00 10 mg via ORAL
  Filled 2020-04-09: qty 2

## 2020-04-09 MED ORDER — LIDOCAINE-EPINEPHRINE-TETRACAINE (LET) TOPICAL GEL
3.0000 mL | Freq: Once | TOPICAL | Status: AC
  Administered 2020-04-09: 09:00:00 3 mL via TOPICAL
  Filled 2020-04-09: qty 3

## 2020-04-09 MED ORDER — SODIUM CHLORIDE 0.9 % IV BOLUS
1000.0000 mL | Freq: Once | INTRAVENOUS | Status: AC
  Administered 2020-04-09: 10:00:00 1000 mL via INTRAVENOUS

## 2020-04-09 NOTE — ED Provider Notes (Signed)
[facility] EMERGENCY DEPARTMENT Provider Note   CSN: [id number] Arrival date & time: 04/08/20  2054     History Chief Complaint  Patient presents with  . Loss of Consciousness    James House. is a 49 y.o. male past medical history of CKD stage III, hypertension, light chain kappa multiple myeloma s/p ASCT currently in remission taking maintenance revlimid, neuropathy. Tetanus is not up to date.  HPI Patient presents to emergency department today via EMS with chief complaint of syncope. Patient states he was at his cousin's house and had stood up to walk into the kitchen. After a minute or two of standing there he felt lightheaded. The next thing he knew he was waking up on the ground. His aunt witnessed the syncopal episode. He hit the back of his head on a chair. He denies any seizure like activity per aunt and has no history of seizures. He is endorsing a headache. Headache has progressively worsened since onset. Describes pain as throbbing sensation.  Pain is 10/10 in severity. Denies any neck stiffness. He states he had decreased OP fluid intake yesterday, might be dehydrated. He did not have any chest pain before or since the syncopal episode. He still feels lightheaded when changing positions. He has not taken any medications for his symptoms prior to arrival. Denies any recent antibiotic use or medications changes. Denies recent illness. Denies fever, chills, neck pain, palpitations, shortness of breath, back pain,urinary symptoms, diarrhea, numbness, tingling, weakness.    Past Medical History:  Diagnosis Date  . CKD (chronic kidney disease) stage 3, GFR 30-59 ml/min (HCC)   . Hypertension   . Multiple myeloma ([place]) 08/11/2017  . Neuropathy   . Pneumonia     Patient Active Problem List   Diagnosis Date Noted  . Uncomplicated opioid dependence ([place]) 03/08/2020  . DDD (degenerative disc disease), thoracic 11/08/2019  . Non-traumatic compression fracture  of T7 thoracic vertebra, sequela 11/08/2019  . Pruritus 09/13/2019  . Thoracic radiculitis (Right) 08/13/2019  . Acute diarrhea 08/03/2019  . Shingles 08/03/2019  . Preventative health care 12/14/2018  . DDD (degenerative disc disease), lumbosacral 10/13/2018  . Elevated serum creatinine 09/28/2018  . Piriformis muscle pain (Left) 06/23/2018  . Spasm of piriformis muscle (Left) 06/23/2018  . Piriformis syndrome (Left) 06/15/2018  . Osteoarthritis of hips (Bilateral) 05/21/2018  . Need for prophylactic measure 04/13/2018  . Status post autologous bone marrow transplant ([place]) 04/13/2018  . Chronic lumbar radiculopathy (by EMG/PNCV) (L5) (Right) 01/26/2018  . Chronic low back pain (Primary Area of Pain) (Bilateral) (L>R) w/o sciatica 01/26/2018  . Spasm of back muscles 01/26/2018  . Chronic musculoskeletal pain 01/26/2018  . Chemotherapy-induced peripheral neuropathy ([place]) 12/24/2017  . Neurogenic pain 12/24/2017  . Neuropathic pain 12/24/2017  . History of Partial knee replacement (Left) 12/24/2017  . Chronic knee pain s/p partial replacement (Left) 12/24/2017  . Elevated sedimentation rate 12/24/2017  . Hypocalcemia 12/24/2017  . Osteoarthritis of knee (Right) 12/24/2017  . Chronic hip pain (Bilateral) (R>L) 12/24/2017  . Chronic sacroiliac joint pain (Bilateral) (R>L) 12/24/2017  . Other specified dorsopathies, sacral and sacrococcygeal region 12/24/2017  . Lumbar facet syndrome (Bilateral) (L>R) 12/24/2017  . Spondylosis without myelopathy or radiculopathy, lumbar region 12/24/2017  . Chronic low back pain (Primary Area of Pain) (Bilateral) (L>R) w/ sciatica (Bilateral) 11/26/2017  . Chronic lower extremity pain (Secondary Area of Pain) (Bilateral) (L>R) 11/26/2017  . Chronic knee pain [facility] Area of Pain) (Bilateral) (L>R) 11/26/2017  . Chronic  pain syndrome 11/26/2017  . Long term current use of opiate analgesic 11/26/2017  . Pharmacologic therapy 11/26/2017  . Disorder of  skeletal system 11/26/2017  . Problems influencing health status 11/26/2017  . Vitamin B 12 deficiency 10/31/2017  . Syncope 08/23/2017  . CKD (chronic kidney disease), stage III ([place]) 08/23/2017  . HTN (hypertension) 08/23/2017  . Severe sepsis ([place]) 08/11/2017  . Hyponatremia 08/11/2017  . Multiple myeloma ([place]) 08/11/2017    Past Surgical History:  Procedure Laterality Date  . ABDOMINAL SURGERY    . JOINT REPLACEMENT     right knee  . KNEE SURGERY Left        Family History  Problem Relation Age of Onset  . Cancer Mother 43       Pancreatic  . COPD Mother   . Diabetes Mother   . Hyperlipidemia Mother   . Hypertension Mother   . Cancer Maternal Uncle        pancreatic  . Cancer Maternal Grandmother 31       colon  . COPD Maternal Grandmother   . Diabetes Maternal Grandmother   . Hypertension Maternal Grandmother     Social History   Tobacco Use  . Smoking status: Former Smoker    Quit date: 08/19/2013    Years since quitting: 6.6  . Smokeless tobacco: Current User    Types: Snuff  Vaping Use  . Vaping Use: Never used  Substance Use Topics  . Alcohol use: No  . Drug use: No    Home Medications Prior to Admission medications   Medication Sig Start Date End Date Taking? Authorizing Provider  aspirin EC 81 MG tablet Take 81 mg by mouth daily.    [provider]  baclofen (LIORESAL) 10 MG tablet Take 1 tablet (10 mg total) by mouth 4 (four) times daily. As needed for muscle spasms. 03/06/20 06/04/20  Pleas Koch, NP  lenalidomide (REVLIMID) 5 MG capsule Take 1 capsule (5 mg total) by mouth daily. [employer] Auth # [id number] done on 03/10/2020 04/04/20   Sindy Guadeloupe, MD  oxyCODONE (OXY IR/ROXICODONE) 5 MG immediate release tablet Take 1 tablet (5 mg total) by mouth every 8 (eight) hours as needed for severe pain. Must last 30 days 03/08/20 04/07/20  Milinda Pointer, MD  oxyCODONE (OXY IR/ROXICODONE) 5 MG immediate release tablet Take 1 tablet (5  mg total) by mouth every 8 (eight) hours as needed for severe pain. Must last 30 days 04/07/20 05/07/20  Milinda Pointer, MD  oxyCODONE (OXY IR/ROXICODONE) 5 MG immediate release tablet Take 1 tablet (5 mg total) by mouth every 8 (eight) hours as needed for severe pain. Must last 30 days 05/07/20 06/06/20  Milinda Pointer, MD  pregabalin (LYRICA) 150 MG capsule Take 1 capsule (150 mg total) by mouth 3 (three) times daily. 03/06/20 06/04/20  Pleas Koch, NP  sertraline (ZOLOFT) 25 MG tablet TAKE 1 TABLET (25 MG TOTAL) BY MOUTH DAILY. 02/22/20   Sindy Guadeloupe, MD    Allergies    Patient has no known allergies.  Review of Systems   Review of Systems All other systems are reviewed and are negative for acute change except as noted in the HPI.  Physical Exam Updated Vital Signs BP (!) 118/93 (BP Location: Right Arm)   Pulse 70   Temp 98.6 F (37 C) (Oral)   Resp 18   SpO2 98%   Physical Exam Vitals and nursing note reviewed.  Constitutional:      General:  He is not in acute distress.    Appearance: He is not ill-appearing.  HENT:     Head: Normocephalic. No raccoon eyes or Battle's sign.     Jaw: There is normal jaw occlusion.      Comments: No active bleeding.  Patient on left parietal scalp is 5 cm.  41m deep abrasion on crown. No active bleeding    Right Ear: Tympanic membrane and external ear normal. No hemotympanum.     Left Ear: Tympanic membrane and external ear normal. No hemotympanum.     Nose: Nose normal.     Mouth/Throat:     Mouth: Mucous membranes are moist.     Pharynx: Oropharynx is clear.     Comments: No injury or wounds to tongue or oral mucosa Eyes:     General: No scleral icterus.       Right eye: No discharge.        Left eye: No discharge.     Extraocular Movements: Extraocular movements intact.     Conjunctiva/sclera: Conjunctivae normal.     Pupils: Pupils are equal, round, and reactive to light.  Neck:     Vascular: No JVD.   Cardiovascular:     Rate and Rhythm: Normal rate and regular rhythm.     Pulses: Normal pulses.          Radial pulses are 2+ on the right side and 2+ on the left side.     Heart sounds: Normal heart sounds. No murmur heard. No friction rub. No gallop.   Pulmonary:     Comments: Lungs clear to auscultation in all fields. Symmetric chest rise. No wheezing, rales, or rhonchi. Abdominal:     Comments: Abdomen is soft, non-distended, and non-tender in all quadrants. No rigidity, no guarding. No peritoneal signs.  Musculoskeletal:        General: Normal range of motion.     Cervical back: Normal range of motion.  Skin:    General: Skin is warm and dry.     Capillary Refill: Capillary refill takes less than 2 seconds.  Neurological:     Mental Status: He is oriented to person, place, and time.     GCS: GCS eye subscore is 4. GCS verbal subscore is 5. GCS motor subscore is 6.     Comments: Speech is clear and goal oriented, follows commands CN III-XII intact, no facial droop Normal strength in upper and lower extremities bilaterally including dorsiflexion and plantar flexion, strong and equal grip strength Sensation normal to light and sharp touch Moves extremities without ataxia, coordination intact Normal finger to nose and rapid alternating movements Normal gait and balance   Psychiatric:        Behavior: Behavior normal.     ED Results / Procedures / Treatments   Labs (all labs ordered are listed, but only abnormal results are displayed) Labs Reviewed  BASIC METABOLIC PANEL - Abnormal; Notable for the following components:      Result Value   Glucose, Bld 104 (*)    Creatinine, Ser 1.47 (*)    GFR, Estimated 58 (*)    All other components within normal limits  CBC  URINALYSIS, ROUTINE W REFLEX MICROSCOPIC    EKG EKG Interpretation  Date/Time:  Saturday April 08 2020 21:59:38 EST Ventricular Rate:  65 PR Interval:  132 QRS Duration: 90 QT Interval:  388 QTC  Calculation: 403 R Axis:   55 Text Interpretation: Normal sinus rhythm Normal ECG No significant change since  last tracing Confirmed by Isla Pence [phone]) on 04/09/2020 8:06:15 AM   Radiology No results found.  Procedures .Marland KitchenLaceration Repair  Date/Time: 04/09/2020 10:50 AM Performed by: Barrie Folk, PA-C Authorized by: Barrie Folk, PA-C   Consent:    Consent obtained:  Verbal   Consent given by:  Patient   Risks, benefits, and alternatives were discussed: yes     Risks discussed:  Infection, pain and poor wound healing   Alternatives discussed:  No treatment Universal protocol:    Patient identity confirmed:  Verbally with patient Anesthesia:    Anesthesia method:  Topical application   Topical anesthetic:  LET Laceration details:    Location:  Scalp   Scalp location:  L parietal   Length (cm):  5   Depth (mm):  2 Pre-procedure details:    Preparation:  Patient was prepped and draped in usual sterile fashion Exploration:    Hemostasis achieved with:  Direct pressure   Imaging outcome: foreign body not noted     Wound exploration: entire depth of wound visualized   Treatment:    Area cleansed with:  Saline   Amount of cleaning:  Standard   Irrigation solution:  Sterile saline   Irrigation method:  Syringe   Visualized foreign bodies/material removed: no   Skin repair:    Repair method:  Staples   Number of staples:  2 Approximation:    Approximation:  Close Repair type:    Repair type:  Simple Post-procedure details:    Dressing:  Open (no dressing)   Procedure completion:  Tolerated well, no immediate complications   (including critical care time)  Medications Ordered in ED Medications  baclofen (LIORESAL) tablet 10 mg (10 mg Oral Given 04/09/20 0942)  oxyCODONE (Oxy IR/ROXICODONE) immediate release tablet 10 mg (10 mg Oral Given 04/09/20 0930)  pregabalin (LYRICA) capsule 150 mg (150 mg Oral Given 04/09/20 0929)  Tdap (BOOSTRIX)  injection 0.5 mL (0.5 mLs Intramuscular Given 04/09/20 0930)  lidocaine-EPINEPHrine-tetracaine (LET) topical gel (3 mLs Topical Given 04/09/20 0929)  sodium chloride 0.9 % bolus 1,000 mL (0 mLs Intravenous Stopped 04/09/20 1049)  acetaminophen (TYLENOL) tablet 650 mg (650 mg Oral Given 04/09/20 1055)    ED Course  I have reviewed the triage vital signs and the nursing notes.  Pertinent labs & imaging results that were available during my care of the patient were reviewed by me and considered in my medical decision making (see chart for details).  Orthostatic VS for the past 24 hrs:  BP- Lying Pulse- Lying BP- Sitting Pulse- Sitting BP- Standing at 0 minutes Pulse- Standing at 0 minutes  04/09/20 0911 (!) 137/92 60 (!) 131/93 73 (!) 129/98 75       MDM Rules/Calculators/A&P                          History provided by patient with additional history obtained from chart review.    Patient without arrhythmia or tachycardia while here in the department.  Patient without history of congestive heart failure, normal hematocrit, normal ECG, no shortness of breath and systolic blood pressure greater than 90; patient is low risk. Patient given home medications which includes oxycodone for pain.   Labs were collected in triage. CBC unremarkable. CMP notable for creatinine 1.47 which is consistent with his baseline. Neuro exam is normal, although patient admits to feeling lightheaded when changing positions. No exam findings to suggest there was seizure activity.  EKG shows  normal sinus rhythm.  Orthostatic VS negative however patient endorsed feeling lightheaded while standing. Given 1L NS On reassessment he is asymptomatic. Pain improved after home medications and lightheadedness resolved after fluids. Tetanus updated. Two wound on on left parietal scalp and crown cleaned. Staples placed on laceration of left parietal scalp.  See procedure note above.  Abrasion on the crown is 2 mm deep however  there is no skin for wound closure.  There is no active bleeding.  Discussed proper wound care with patient as wound cannot be closed. Patient to discharge home with close PCP follow-up.  Possibility of recurrent syncope has been discussed. I discussed reasons to avoid driving until cardiology/PCP followup and other safety prevention including use of ladders and working at heights.  Pt has remained hemodynamically stable throughout their time in the ED. because of prolonged wait in the lobby patient was in the emergency room for total of 14 hours.  He had no further syncopal events. The patient was discussed with and seen by Dr. Gilford Raid who agrees with the treatment plan.   Portions of this note were generated with [employer]. Dictation errors may occur despite best attempts at proofreading.   Final Clinical Impression(s) / ED Diagnoses Final diagnoses:  Syncope, unspecified syncope type    Rx / DC Orders ED Discharge Orders    None       James House 04/09/20 1106    Isla Pence, MD 04/09/20 1146

## 2020-04-09 NOTE — Discharge Instructions (Addendum)
Follow-up with your primary care doctor in 2 days for a wound check.  You should also have emergency room follow-up visit with primary care doctor to discuss your passing out.  -Wash the wounds with antibacterial soap and water at least 2-3 times daily.  I would not use shampoo.  Pat the areas dry.  -Take Tylenol for pain  -Staples will need to be removed in 7 days.  This can be done by your primary care doctor or the emergency department.  Watch for signs of infection which would include worsening redness around the wounds, a lot of pus draining from the wound, or if you have fever and chills.  If you develop those you need to return to the emergency department for evaluation.

## 2020-04-09 NOTE — ED Notes (Signed)
Patient Alert and oriented to baseline. Stable and ambulatory to baseline. Patient verbalized understanding of the discharge instructions.  Patient belongings were taken by the patient.   

## 2020-04-25 ENCOUNTER — Ambulatory Visit (INDEPENDENT_AMBULATORY_CARE_PROVIDER_SITE_OTHER): Payer: Self-pay | Admitting: Primary Care

## 2020-04-25 ENCOUNTER — Other Ambulatory Visit: Payer: Self-pay

## 2020-04-25 ENCOUNTER — Encounter: Payer: Self-pay | Admitting: Primary Care

## 2020-04-25 DIAGNOSIS — R55 Syncope and collapse: Secondary | ICD-10-CM

## 2020-04-25 NOTE — Patient Instructions (Signed)
Please notify me if your headaches and dizziness persist as discussed.  It was a pleasure to see you today!

## 2020-04-25 NOTE — Progress Notes (Signed)
Subjective:    Patient ID: James House., male    DOB: 23-Jan-1971, 50 y.o.   MRN: [id number]  HPI  This visit occurred during the SARS-CoV-2 public health emergency.  Safety protocols were in place, including screening questions prior to the visit, additional usage of staff PPE, and extensive cleaning of exam room while observing appropriate contact time as indicated for disinfecting solutions.   Mr. James House is a 50 year old male with a history of CKD, hypertension, multiple myeloma in remission who presents today for ED follow up and staple removal.  Evaluated at [facility] on 04/08/20 via EMS with syncope episode while standing in his family's kitchen, he woke up on the ground. Work up in the ED for syncope was grossly negative, he did undergo placement of 2 staples for which he is needing removed today.  Since his ED visit he continues to feel tired/fatigued, is under a lot of personal stress at home. He denies further syncopal episodes, does continue to experience lightheadedness and headaches. Symptoms are no worse than before, denies worst headache of his life. He is drinking 2 bottles of water daily, mostly drinking sweet tea.    Review of Systems  Constitutional: Positive for fatigue.  Cardiovascular: Negative for chest pain.  Neurological: Positive for light-headedness and headaches.       Past Medical History:  Diagnosis Date  . CKD (chronic kidney disease) stage 3, GFR 30-59 ml/min (HCC)   . Hypertension   . Multiple myeloma ([place]) 08/11/2017  . Neuropathy   . Pneumonia      Social History   Socioeconomic History  . Marital status: Married    Spouse name: [name]  . Number of children: 3  . Years of education: Not on file  . Highest education level: Not on file  Occupational History  . Not on file  Tobacco Use  . Smoking status: Former Smoker    Quit date: 08/19/2013    Years since quitting: 6.6  . Smokeless tobacco: Current User    Types: Snuff  Vaping Use  .  Vaping Use: Never used  Substance and Sexual Activity  . Alcohol use: No  . Drug use: No  . Sexual activity: Yes  Other Topics Concern  . Not on file  Social History Narrative   Live in private residence with spouse and mother in law   Social Determinants of Health   Financial Resource Strain: Not on file  Food Insecurity: Not on file  Transportation Needs: Not on file  Physical Activity: Not on file  Stress: Not on file  Social Connections: Not on file  Intimate Partner Violence: Not on file    Past Surgical History:  Procedure Laterality Date  . ABDOMINAL SURGERY    . JOINT REPLACEMENT     right knee  . KNEE SURGERY Left     Family History  Problem Relation Age of Onset  . Cancer Mother 75       Pancreatic  . COPD Mother   . Diabetes Mother   . Hyperlipidemia Mother   . Hypertension Mother   . Cancer Maternal Uncle        pancreatic  . Cancer Maternal Grandmother 8       colon  . COPD Maternal Grandmother   . Diabetes Maternal Grandmother   . Hypertension Maternal Grandmother     No Known Allergies  Current Outpatient Medications on File Prior to Visit  Medication Sig Dispense Refill  . aspirin  EC 81 MG tablet Take 81 mg by mouth daily.    . baclofen (LIORESAL) 10 MG tablet Take 1 tablet (10 mg total) by mouth 4 (four) times daily. As needed for muscle spasms. 360 tablet 0  . lenalidomide (REVLIMID) 5 MG capsule Take 1 capsule (5 mg total) by mouth daily. [employer] Auth # [id number] done on 03/10/2020 28 capsule 0  . oxyCODONE (OXY IR/ROXICODONE) 5 MG immediate release tablet Take 1 tablet (5 mg total) by mouth every 8 (eight) hours as needed for severe pain. Must last 30 days 90 tablet 0  . [START ON 05/07/2020] oxyCODONE (OXY IR/ROXICODONE) 5 MG immediate release tablet Take 1 tablet (5 mg total) by mouth every 8 (eight) hours as needed for severe pain. Must last 30 days 90 tablet 0  . pregabalin (LYRICA) 150 MG capsule Take 1 capsule (150 mg total) by mouth 3  (three) times daily. 270 capsule 0  . sertraline (ZOLOFT) 25 MG tablet TAKE 1 TABLET (25 MG TOTAL) BY MOUTH DAILY. 30 tablet 2  . oxyCODONE (OXY IR/ROXICODONE) 5 MG immediate release tablet Take 1 tablet (5 mg total) by mouth every 8 (eight) hours as needed for severe pain. Must last 30 days 90 tablet 0   No current facility-administered medications on file prior to visit.    BP 118/74   Pulse 76   Temp 98.4 F (36.9 C) (Temporal)   Ht '5\' 7"'  (1.702 m)   Wt 158 lb (71.7 kg)   SpO2 98%   BMI 24.75 kg/m    Objective:   Physical Exam Constitutional:      Appearance: He is well-nourished.  Eyes:     Extraocular Movements: Extraocular movements intact.  Cardiovascular:     Rate and Rhythm: Normal rate and regular rhythm.  Pulmonary:     Effort: Pulmonary effort is normal.  Musculoskeletal:     Cervical back: Neck supple.  Skin:    General: Skin is warm and dry.     Comments: Scabbing to area of 2 intact staples to left crown. Small scabbed gash to right crown, no erythema or drainage.  Neurological:     Mental Status: He is oriented to person, place, and time.     Cranial Nerves: No cranial nerve deficit.     Coordination: Coordination normal.  Psychiatric:        Mood and Affect: Mood and affect normal.            Assessment & Plan:

## 2020-04-25 NOTE — Assessment & Plan Note (Signed)
Recent episode occurring several weeks ago, evaluated in ED. No CT head obtained.  2 intact staples removed today, no complications with healing. Work up reviewed from ED visit.  Discussed to notify if headaches persist/worsen with or without lightheadedness. In that case we would obtain CT head to rule out bleeding.   Also strongly advised he increase water intake to 6 bottles daily.

## 2020-04-26 ENCOUNTER — Telehealth: Payer: Self-pay | Admitting: *Deleted

## 2020-04-26 NOTE — Telephone Encounter (Signed)
Patient wanted to know if he still had approval to get free drug from the company that makes revlimid. I checked with Alyson and she said he is good to go til October 2021 but he has to get it at [employer] [facility]. I told him that I called [facility] and they have left him  amessage but never heard back from pt. I gave him the phone # [phone]. He says he will call them and get his med sent to him

## 2020-05-08 MED FILL — oxyCODONE HCL 5 MG TABS: 5 | 30 days supply | Qty: 90 | Fill #0

## 2020-05-09 MED FILL — SERTRALINE HCL 25 MG TABS: 25 | 30 days supply | Qty: 30 | Fill #2

## 2020-05-23 ENCOUNTER — Encounter (HOSPITAL_COMMUNITY): Payer: Self-pay | Admitting: Emergency Medicine

## 2020-05-23 ENCOUNTER — Other Ambulatory Visit: Payer: Self-pay

## 2020-05-23 ENCOUNTER — Emergency Department (HOSPITAL_COMMUNITY)
Admission: EM | Admit: 2020-05-23 | Discharge: 2020-05-24 | Disposition: A | Discharge status: Home / Self Care | Payer: Self-pay | Attending: Emergency Medicine | Admitting: Emergency Medicine

## 2020-05-23 DIAGNOSIS — Z5321 Procedure and treatment not carried out due to patient leaving prior to being seen by health care provider: Secondary | ICD-10-CM | POA: Insufficient documentation

## 2020-05-23 DIAGNOSIS — R197 Diarrhea, unspecified: Secondary | ICD-10-CM | POA: Insufficient documentation

## 2020-05-23 DIAGNOSIS — R1110 Vomiting, unspecified: Secondary | ICD-10-CM | POA: Insufficient documentation

## 2020-05-23 LAB — CBC
HCT: 50.1 % (ref 39.0–52.0)
Hemoglobin: 17.1 g/dL — ABNORMAL HIGH (ref 13.0–17.0)
MCH: 31.9 pg (ref 26.0–34.0)
MCHC: 34.1 g/dL (ref 30.0–36.0)
MCV: 93.5 fL (ref 80.0–100.0)
Platelets: 198 10*3/uL (ref 150–400)
RBC: 5.36 MIL/uL (ref 4.22–5.81)
RDW: 13.4 % (ref 11.5–15.5)
WBC: 5.5 10*3/uL (ref 4.0–10.5)
nRBC: 0 % (ref 0.0–0.2)

## 2020-05-23 LAB — COMPREHENSIVE METABOLIC PANEL
ALT: 20 U/L (ref 0–44)
AST: 21 U/L (ref 15–41)
Albumin: 3.8 g/dL (ref 3.5–5.0)
Alkaline Phosphatase: 66 U/L (ref 38–126)
Anion gap: 11 (ref 5–15)
BUN: 14 mg/dL (ref 6–20)
CO2: 22 mmol/L (ref 22–32)
Calcium: 8.7 mg/dL — ABNORMAL LOW (ref 8.9–10.3)
Chloride: 105 mmol/L (ref 98–111)
Creatinine, Ser: 1.41 mg/dL — ABNORMAL HIGH (ref 0.61–1.24)
GFR, Estimated: >60 mL/min (ref >60)
Glucose, Bld: 82 mg/dL (ref 70–99)
Potassium: 4.1 mmol/L (ref 3.5–5.1)
Sodium: 138 mmol/L (ref 135–145)
Total Bilirubin: 1.4 mg/dL — ABNORMAL HIGH (ref 0.3–1.2)
Total Protein: 6.8 g/dL (ref 6.5–8.1)

## 2020-05-23 LAB — URINALYSIS, ROUTINE W REFLEX MICROSCOPIC
Glucose, UA: NEGATIVE mg/dL
Hgb urine dipstick: NEGATIVE
Ketones, ur: >80 mg/dL — AB
Leukocytes,Ua: NEGATIVE
Nitrite: NEGATIVE
Protein, ur: NEGATIVE mg/dL
Specific Gravity, Urine: 1.025 (ref 1.005–1.030)
pH: 6 (ref 5.0–8.0)

## 2020-05-23 LAB — LIPASE, BLOOD: Lipase: 37 U/L (ref 11–51)

## 2020-05-23 NOTE — Telephone Encounter (Signed)
[facility] - Client TELEPHONE ADVICE RECORD [facility] Patient Name: James House Gender: Male DOB: Dec 21, 1970 Age: 50 Y 70 M Return Phone Number: [phone] (Primary) [place] [address] Client [facility] - Client Client Site [place] - Day Physician Alma Friendly - NP Contact Type Call Who Is Calling Patient / Member / Family / Caregiver Call Type Triage / Clinical Relationship To Patient Self Return Phone Number [phone] (Primary) Chief Complaint Vomiting Reason for Call Symptomatic / Request for [place] states he is vomiting, diarrhea, and has fatigue. He has urinated recently. [facility] Translation No Nurse Assessment Nurse: Ysidro Evert, RN, [name] Date/Time Eilene Ghazi Time): 05/23/2020 2:36:39 PM Confirm and document reason for call. If symptomatic, describe symptoms. ---Caller states she is having vomiting and fatigue that started yesterday. He is fatigued. Current temp is 101 Does the patient have any new or worsening symptoms? ---Yes Will a triage be completed? ---Yes Related visit to physician within the last 2 weeks? ---No Does the PT have any chronic conditions? (i.e. diabetes, asthma, this includes High risk factors for pregnancy, etc.) ---Yes List chronic conditions. ---multiple myeloma, ckd Is this a behavioral health or substance abuse call? ---No Guidelines Guideline Title Affirmed Question Affirmed Notes Nurse Date/Time (Eastern Time) Vomiting [1] SEVERE vomiting (e.g., 6 or more times/ day) AND [2] present > 8 hours (Exception: patient sounds well, is drinking liquids, does not sound dehydrated, and vomiting has lasted less than 24 hours) Ysidro Evert, RN, [name] 05/23/2020 2:38:05 PM Disp. Time Eilene Ghazi Time) Disposition Final User 05/23/2020 2:41:27 PM Go to ED Now (or PCP triage)  Yes Ysidro Evert, RN, [name] PLEASE NOTE: All timestamps contained within this report are represented as [place] Standard Time. CONFIDENTIALTY NOTICE: This fax transmission is intended only for the addressee. It contains information that is legally privileged, confidential or otherwise protected from use or disclosure. If you are not the intended recipient, you are strictly prohibited from reviewing, disclosing, copying using or disseminating any of this information or taking any action in reliance on or regarding this information. If you have received this fax in error, please notify us immediately by telephone so that we can arrange for its return to [place]. Phone: [phone], Toll-Free: [phone], Fax: [phone] Page: 2 of 2 Call Id: [id number] [place] Disagree/Comply Comply Caller Understands Yes PreDisposition Did not know what to do Care Advice Given Per Guideline GO TO ED NOW (OR PCP TRIAGE): * IF NO PCP (PRIMARY CARE PROVIDER) SECOND-LEVEL TRIAGE: You need to be seen within the next hour. Go to the [facility] at _____________ [place] as soon as you can. Referrals GO TO FACILITY OTHER - SPECIFY

## 2020-05-23 NOTE — Telephone Encounter (Signed)
Noted and agree to ED evaluation, especially given his medical history.

## 2020-05-23 NOTE — ED Triage Notes (Addendum)
Pt arrives to ED with c/o of emesis and diarrhea x5 days. Pt reports yellow tinged diarrhea and brown emesis. Today pt has thrown up x2 and x10 watery stools. Pt reports L sided abd pain for x3 days. Pain is achy and comes and goes. Pt w hx of multiple myeloma and takes chemo pill nightly.

## 2020-05-23 NOTE — Telephone Encounter (Signed)
I spoke with pt and his daughter is taking pt to [facility] ED. FYI to Gentry Fitz NP.

## 2020-05-24 NOTE — ED Notes (Signed)
Pt did not respond when called for vitals check X2

## 2020-05-24 NOTE — ED Notes (Signed)
Pt still does not answer when called for vitals check 

## 2020-05-25 ENCOUNTER — Other Ambulatory Visit: Payer: Self-pay | Admitting: Primary Care

## 2020-05-25 DIAGNOSIS — M792 Neuralgia and neuritis, unspecified: Secondary | ICD-10-CM

## 2020-05-25 DIAGNOSIS — G620 Drug-induced polyneuropathy: Secondary | ICD-10-CM

## 2020-05-25 DIAGNOSIS — T451X5A Adverse effect of antineoplastic and immunosuppressive drugs, initial encounter: Secondary | ICD-10-CM

## 2020-05-25 NOTE — Telephone Encounter (Signed)
Pharmacy requests refill on: Pregabalin 150 mg   LAST REFILL: 03/06/2020 (Q-270, R-0) LAST OV: 04/25/2020 NEXT OV: Not Scheduled  PHARMACY: [facility]

## 2020-05-26 ENCOUNTER — Other Ambulatory Visit: Payer: Self-pay | Admitting: Primary Care

## 2020-05-26 NOTE — Telephone Encounter (Signed)
No suspicious activity noted on PMP aware web site. Last office visit is up-to-date. Refill sent to pharmacy.

## 2020-05-30 ENCOUNTER — Encounter: Payer: Self-pay | Admitting: *Deleted

## 2020-05-31 ENCOUNTER — Telehealth: Payer: Self-pay | Admitting: *Deleted

## 2020-05-31 NOTE — Telephone Encounter (Signed)
Pt called and asked for another letter for work. 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 I made a letter and emailed to his company. email was [email or web address]. pt aware of the email and it was sent

## 2020-06-04 NOTE — Progress Notes (Signed)
PROVIDER NOTE: Information contained herein reflects review and annotations entered in association with encounter. Interpretation of such information and data should be left to medically-trained personnel. Information provided to patient can be located elsewhere in the medical record under "Patient Instructions". Document created using STT-dictation technology, any transcriptional errors that may result from process are unintentional.    Patient: [name]  Service Category: E/M  Provider: Gaspar Cola, MD  DOB: 1970/07/19  DOS: 06/05/2020  Specialty: Interventional Pain Management  MRN: [id number]  Setting: Ambulatory outpatient  PCP: Pleas Koch, NP  Type: Established Patient    Referring Provider: Pleas Koch, NP  Location: Office  Delivery: Face-to-face     HPI  Mr. Naaman Curro., a 50 y.o. year old male, is here today because of his Chronic pain syndrome [G89.4]. Mr. Ferris primary complain today is Back Pain (Lumbar bilateral ) and Joint Pain (Elbows bilaterally, very stiff ) Last encounter: My last encounter with him was on 03/08/2020. Pertinent problems: Mr. Vandezande has Multiple myeloma ([place]); Chronic low back pain (Primary Area of Pain) (Bilateral) (L>R) w/ sciatica (Bilateral); Chronic lower extremity pain (Secondary Area of Pain) (Bilateral) (L>R); Chronic knee pain (Tertiary Area of Pain) (Bilateral) (L>R); Chronic pain syndrome; Chemotherapy-induced peripheral neuropathy ([place]); Neurogenic pain; Neuropathic pain; History of Partial knee replacement (Left); Chronic knee pain s/p partial replacement (Left); Osteoarthritis of knee (Right); Chronic hip pain (Bilateral) (R>L); Chronic sacroiliac joint pain (Bilateral) (R>L); Other specified dorsopathies, sacral and sacrococcygeal region; Lumbar facet syndrome (Bilateral) (L>R); Spondylosis without myelopathy or radiculopathy, lumbar region; Chronic lumbar radiculopathy (by EMG/PNCV) (L5) (Right); Chronic low back pain  (Primary Area of Pain) (Bilateral) (L>R) w/o sciatica; Spasm of back muscles; Chronic musculoskeletal pain; Osteoarthritis of hips (Bilateral); Piriformis syndrome (Left); Piriformis muscle pain (Left); Spasm of piriformis muscle (Left); DDD (degenerative disc disease), lumbosacral; Shingles; Thoracic radiculitis (Right); DDD (degenerative disc disease), thoracic; Non-traumatic compression fracture of T7 thoracic vertebra, sequela; Olecranon bursitis (Bilateral); and Elbow joint pain (Bilateral) on their pertinent problem list. Pain Assessment: Severity of Chronic pain is reported as a 7 /10. Location: Elbow Right,Left/down both legs. Onset: More than a month ago. Quality: Other (Comment) (stiff). Timing: Constant. Modifying factor(s): medications, lyrica. Vitals:  height is 5' 7" (1.702 m) and weight is 150 lb (68 kg). His temporal temperature is 97 F (36.1 C) (abnormal). His blood pressure is 156/97 (abnormal) and his pulse is 81. His respiration is 16 and oxygen saturation is 100%.   Reason for encounter: medication management.   The patient indicates doing well with the current medication regimen. No adverse reactions or side effects reported to the medications.  The patient comes into the clinic today indicating having a new problem which started approximately 2 weeks.  He is having bilateral elbow pain in the area over the medial olecranon.  The pain seems to be over the ulnar collateral ligament, bilaterally.  He denies any trauma associated to this pain.  Palpation reveals tenderness and edema around the area.  Today I will go ahead and order some x-rays of the area to evaluate for possible problems other than a simple bursitis.  However, I will also schedule him to return for a possible bilateral elbow injection under fluoroscopic guidance.  The plan was shared with the patient who understood and agreed.  RTCB: 09/04/2020 Nonopioids transfer 01/31/2020: Baclofen and Lyrica  Pharmacotherapy  Assessment   Analgesic: Oxycodone IR 5 mg, 1 tab PO BID (10 mg/day of oxycodone) MME/day: 15  mg/day.   Monitoring: [place] PMP: PDMP reviewed during this encounter.       Pharmacotherapy: No side-effects or adverse reactions reported. Compliance: No problems identified. Effectiveness: Clinically acceptable.  Janett Billow, RN  06/05/2020  8:44 AM  Sign when Signing Visit Nursing Pain Medication Assessment:  Safety precautions to be maintained throughout the outpatient stay will include: orient to surroundings, keep bed in low position, maintain call bell within reach at all times, provide assistance with transfer out of bed and ambulation.  Medication Inspection Compliance: Pill count conducted under aseptic conditions, in front of the patient. Neither the pills nor the bottle was removed from the patient's sight at any time. Once count was completed pills were immediately returned to the patient in their original bottle.  Medication: Oxycodone IR Pill/Patch Count: 8 of 90 pills remain Pill/Patch Appearance: Markings consistent with prescribed medication Bottle Appearance: Standard pharmacy container. Clearly labeled. Filled Date: 01 / 17 / 2022 Last Medication intake:  Today    UDS:  Summary  Date Value Ref Range Status  03/08/2020 Note  Final    Comment:    ==================================================================== [employer] Select 13 (MW) ==================================================================== Test                             Result       Flag       Units  Drug Absent but Declared for Prescription Verification   Oxycodone                      Not Detected UNEXPECTED ng/mg creat ==================================================================== Test                      Result    Flag   Units      Ref Range   Creatinine              121              mg/dL      >=20 ==================================================================== Declared  Medications:  The flagging and interpretation on this report are based on the  following declared medications.  Unexpected results may arise from  inaccuracies in the declared medications.   **Note: The testing scope of this panel includes these medications:   Oxycodone   **Note: The testing scope of this panel does not include the  following reported medications:   Aspirin  Baclofen (Lioresal)  Lenalidomide (Revlimid)  Pregabalin (Lyrica)  Sertraline (Zoloft) ==================================================================== For clinical consultation, please call [phone]. ====================================================================      ROS  Constitutional: Denies any fever or chills Gastrointestinal: No reported hemesis, hematochezia, vomiting, or acute GI distress Musculoskeletal: Denies any acute onset joint swelling, redness, loss of ROM, or weakness Neurological: No reported episodes of acute onset apraxia, aphasia, dysarthria, agnosia, amnesia, paralysis, loss of coordination, or loss of consciousness  Medication Review  aspirin EC, baclofen, lenalidomide, oxyCODONE, pregabalin, and sertraline  History Review  Allergy: Mr. Calico has No Known Allergies. Drug: Mr. Woolbright  reports no history of drug use. Alcohol:  reports no history of alcohol use. Tobacco:  reports that he quit smoking about 6 years ago. His smokeless tobacco use includes snuff. Social: Mr. Kichline  reports that he quit smoking about 6 years ago. His smokeless tobacco use includes snuff. He reports that he does not drink alcohol and does not use drugs. Medical:  has a past medical history of CKD (chronic kidney  disease) stage 3, GFR 30-59 ml/min ([place]), Hypertension, Multiple myeloma ([place]) (08/11/2017), Neuropathy, and Pneumonia. Surgical: Mr. Henslee  has a past surgical history that includes Joint replacement; Knee surgery (Left); and Abdominal surgery. Family: family history includes COPD in  his maternal grandmother and mother; Cancer in his maternal uncle; Cancer (age of onset: 82) in his maternal grandmother; Cancer (age of onset: 13) in his mother; Diabetes in his maternal grandmother and mother; Hyperlipidemia in his mother; Hypertension in his maternal grandmother and mother.  Laboratory Chemistry Profile   Renal Lab Results  Component Value Date   BUN 14 05/23/2020   CREATININE 1.41 (H) 05/23/2020   BCR 7 (L) 11/26/2017   GFRAA >60 12/31/2019   GFRNONAA >60 05/23/2020     Hepatic Lab Results  Component Value Date   AST 21 05/23/2020   ALT 20 05/23/2020   ALBUMIN 3.8 05/23/2020   ALKPHOS 66 05/23/2020   LIPASE 37 05/23/2020     Electrolytes Lab Results  Component Value Date   NA 138 05/23/2020   K 4.1 05/23/2020   CL 105 05/23/2020   CALCIUM 8.7 (L) 05/23/2020   MG 2.4 (H) 11/26/2017     Bone Lab Results  Component Value Date   25OHVITD1 41 11/26/2017   25OHVITD2 4.4 11/26/2017   25OHVITD3 37 11/26/2017   TESTOSTERONE 570 12/29/2017     Inflammation (CRP: Acute Phase) (ESR: Chronic Phase) Lab Results  Component Value Date   CRP 9 11/26/2017   ESRSEDRATE 16 (H) 11/26/2017   LATICACIDVEN 1.1 08/11/2017       Note: Above Lab results reviewed.  Recent Imaging Review  DG Bone Survey Met CLINICAL DATA:  Multiple myeloma, currently in remission.  EXAM: METASTATIC BONE SURVEY  COMPARISON:  CT scan 12/22/2017  FINDINGS: The lateral skull film demonstrates numerous small lytic myelomatous lesions.  A few small scattered lytic myelomatous lesions are suspected in the clavicles and proximal humeri bilaterally.  Do not see any obvious lesions involving the spine or ribs. Remote healed rib fractures are noted.  No definite lesions involving the bony pelvis or lower extremities.  IMPRESSION: 1. Suspect small lytic myelomatous lesions involving the skull, clavicles and bilateral proximal humeri. 2. No other definite lesions are  identified.  Electronically Signed   By: Marijo Sanes M.D.   On: 10/22/2018 15:51 Note: Reviewed        Physical Exam  General appearance: Well nourished, well developed, and well hydrated. In no apparent acute distress Mental status: Alert, oriented x 3 (person, place, & time)       Respiratory: No evidence of acute respiratory distress Eyes: PERLA Vitals: BP (!) 156/97 (BP Location: Left Arm, Patient Position: Sitting, Cuff Size: Large)   Pulse 81   Temp (!) 97 F (36.1 C) (Temporal)   Resp 16   Ht 5' 7" (1.702 m)   Wt 150 lb (68 kg)   SpO2 100%   BMI 23.49 kg/m  BMI: Estimated body mass index is 23.49 kg/m as calculated from the following:   Height as of this encounter: 5' 7" (1.702 m).   Weight as of this encounter: 150 lb (68 kg). Ideal: Ideal body weight: 66.1 kg (145 lb 11.6 oz) Adjusted ideal body weight: 66.9 kg (147 lb 7 oz)  Assessment   Status Diagnosis  Controlled Controlled Controlled 1. Chronic pain syndrome   2. Chronic low back pain (Primary Area of Pain) (Bilateral) (L>R) w/o sciatica   3. Multiple myeloma, remission status unspecified ([place])  4. Non-traumatic compression fracture of T7 thoracic vertebra, sequela   5. Thoracic radiculitis (Right)   6. Chemotherapy-induced peripheral neuropathy ([place])   7. Pharmacologic therapy   8. Uncomplicated opioid dependence ([place])   9. Olecranon bursitis (Bilateral)   10. Elbow joint pain (Bilateral)      Updated Problems: Problem  Olecranon bursitis (Bilateral)  Elbow joint pain (Bilateral)    Plan of Care  Problem-specific:  No problem-specific Assessment & Plan notes found for this encounter.  Mr. Mckenna Boruff. has a current medication list which includes the following long-term medication(s): baclofen, [START ON 06/06/2020] oxycodone, [START ON 07/06/2020] oxycodone, [START ON 08/05/2020] oxycodone, pregabalin, and sertraline.  Pharmacotherapy (Medications Ordered): Meds ordered this encounter   Medications  . oxyCODONE (OXY IR/ROXICODONE) 5 MG immediate release tablet    Sig: Take 1 tablet (5 mg total) by mouth every 8 (eight) hours as needed for severe pain. Must last 30 days    Dispense:  90 tablet    Refill:  0    Chronic Pain: STOP Act (Not applicable) Fill 1 day early if closed on refill date. Avoid benzodiazepines within 8 hours of opioids  . oxyCODONE (OXY IR/ROXICODONE) 5 MG immediate release tablet    Sig: Take 1 tablet (5 mg total) by mouth every 8 (eight) hours as needed for severe pain. Must last 30 days    Dispense:  90 tablet    Refill:  0    Chronic Pain: STOP Act (Not applicable) Fill 1 day early if closed on refill date. Avoid benzodiazepines within 8 hours of opioids  . oxyCODONE (OXY IR/ROXICODONE) 5 MG immediate release tablet    Sig: Take 1 tablet (5 mg total) by mouth every 8 (eight) hours as needed for severe pain. Must last 30 days    Dispense:  90 tablet    Refill:  0    Chronic Pain: STOP Act (Not applicable) Fill 1 day early if closed on refill date. Avoid benzodiazepines within 8 hours of opioids   Orders:  Orders Placed This Encounter  Procedures  . Medium Joint Injection/Arthrocentesis    Level(s): Olecranon bursa Laterality: Bilateral Sedation: Patient's choice Purpose: Diagnostic Indication(s): Acute pain    Standing Status:   Future    Standing Expiration Date:   09/02/2020    Scheduling Instructions:     Requested Scheduling Timeframe: ASAA  . DG ELBOW COMPLETE LEFT (3+VIEW)    Standing Status:   Future    Standing Expiration Date:   07/03/2020    Scheduling Instructions:     Imaging must be done as soon as possible. Inform patient that order will expire within 30 days and I will not renew it.    Order Specific Question:   Reason for Exam (SYMPTOM  OR DIAGNOSIS REQUIRED)    Answer:   Bilateral elbow pain    Order Specific Question:   Preferred imaging location?    Answer:   [facility]    Order Specific Question:   Call Results-  Best Contact Number?    Answer:   [phone] ([facility])    Order Specific Question:   Radiology Contrast Protocol - do NOT remove file path    Answer:   _0 [email or web address]    Order Specific Question:   Release to patient    Answer:   Immediate  . DG ELBOW COMPLETE RIGHT (3+VIEW)    Standing Status:   Future    Standing Expiration Date:   07/03/2020  Scheduling Instructions:     Imaging must be done as soon as possible. Inform patient that order will expire within 30 days and I will not renew it.    Order Specific Question:   Reason for Exam (SYMPTOM  OR DIAGNOSIS REQUIRED)    Answer:   Bilateral elbow pain    Order Specific Question:   Preferred imaging location?    Answer:   [facility]    Order Specific Question:   Call Results- Best Contact Number?    Answer:   [phone] ([facility])    Order Specific Question:   Radiology Contrast Protocol - do NOT remove file path    Answer:   _0 [email or web address]    Order Specific Question:   Release to patient    Answer:   Immediate   Follow-up plan:   Return in about 13 weeks (around 09/04/2020) for Procedure (no sedation): (B) Olecranon Bursa Inj. #1(ASAP), (F2F), (Med Mgmt).      Interventional management options:  Considering:   DiagnosticbilateralLESI Possible bilateral lumbar facet RFA Diagnostic bilateral sacroiliac joint block Possible bilateral sacroiliac joint RFA Diagnostic right knee Hyalgan series Diagnosticright knee intra-articular knee injection Diagnostic left knee genicular nerve block   Palliative PRN treatment(s):   Palliative bilateral IA hip joint injection #2 (50/50/50/50) Palliative bilateral lumbar facet block #3 (100/100/0) (60/60/60) Palliative left piriformis muscle injection #2 (100/100/0)      Recent Visits Date Type Provider Dept  03/08/20 Office Visit Milinda Pointer, MD [facility]  Showing recent visits within past 90 days and meeting all other requirements Today's Visits Date Type Provider Dept  06/05/20 Office Visit Milinda Pointer, MD [facility]  Showing today's visits and meeting all other requirements Future Appointments No visits were found meeting these conditions. Showing future appointments within next 90 days and meeting all other requirements  I discussed the assessment and treatment plan with the patient. The patient was provided an opportunity to ask questions and all were answered. The patient agreed with the plan and demonstrated an understanding of the instructions.  Patient advised to call back or seek an in-person evaluation if the symptoms or condition worsens.  Duration of encounter: 30 minutes.  Note by: Gaspar Cola, MD Date: 06/05/2020; Time: 9:22 AM

## 2020-06-05 ENCOUNTER — Other Ambulatory Visit (HOSPITAL_COMMUNITY): Payer: Self-pay | Admitting: Pain Medicine

## 2020-06-05 ENCOUNTER — Other Ambulatory Visit: Payer: Self-pay

## 2020-06-05 ENCOUNTER — Ambulatory Visit: Payer: Self-pay | Attending: Pain Medicine | Admitting: Pain Medicine

## 2020-06-05 ENCOUNTER — Encounter: Payer: Self-pay | Admitting: Pain Medicine

## 2020-06-05 VITALS — BP 156/97 | HR 81 | Temp 97.0°F | Resp 16 | Ht 67.0 in | Wt 150.0 lb

## 2020-06-05 DIAGNOSIS — C9000 Multiple myeloma not having achieved remission: Secondary | ICD-10-CM

## 2020-06-05 DIAGNOSIS — M5414 Radiculopathy, thoracic region: Secondary | ICD-10-CM

## 2020-06-05 DIAGNOSIS — M545 Low back pain, unspecified: Secondary | ICD-10-CM

## 2020-06-05 DIAGNOSIS — M4854XS Collapsed vertebra, not elsewhere classified, thoracic region, sequela of fracture: Secondary | ICD-10-CM

## 2020-06-05 DIAGNOSIS — M25522 Pain in left elbow: Secondary | ICD-10-CM

## 2020-06-05 DIAGNOSIS — M7021 Olecranon bursitis, right elbow: Secondary | ICD-10-CM

## 2020-06-05 DIAGNOSIS — M7022 Olecranon bursitis, left elbow: Secondary | ICD-10-CM

## 2020-06-05 DIAGNOSIS — Z79899 Other long term (current) drug therapy: Secondary | ICD-10-CM

## 2020-06-05 DIAGNOSIS — G620 Drug-induced polyneuropathy: Secondary | ICD-10-CM

## 2020-06-05 DIAGNOSIS — G8929 Other chronic pain: Secondary | ICD-10-CM

## 2020-06-05 DIAGNOSIS — M25521 Pain in right elbow: Secondary | ICD-10-CM

## 2020-06-05 DIAGNOSIS — T451X5A Adverse effect of antineoplastic and immunosuppressive drugs, initial encounter: Secondary | ICD-10-CM

## 2020-06-05 DIAGNOSIS — G894 Chronic pain syndrome: Secondary | ICD-10-CM

## 2020-06-05 DIAGNOSIS — F1120 Opioid dependence, uncomplicated: Secondary | ICD-10-CM

## 2020-06-05 MED ORDER — OXYCODONE HCL 5 MG PO TABS
5.0000 mg | ORAL_TABLET | Freq: Three times a day (TID) | ORAL | 0 refills | Status: DC | PRN
Start: 2020-08-05 — End: 2020-08-28

## 2020-06-05 MED ORDER — OXYCODONE HCL 5 MG PO TABS: 5.0000 mg | ORAL_TABLET | Freq: Three times a day (TID) | ORAL | 0 refills | Status: DC | PRN

## 2020-06-05 NOTE — Patient Instructions (Signed)
____________________________________________________________________________________________  Preparing for your procedure (without sedation)  Procedure appointments are limited to planned procedures: . No Prescription Refills. . No disability issues will be discussed. . No medication changes will be discussed.  Instructions: . Oral Intake: Do not eat or drink anything for at least 6 hours prior to your procedure. (Exception: Blood Pressure Medication. See below.) . Transportation: Unless otherwise stated by your physician, you may drive yourself after the procedure. . Blood Pressure Medicine: Do not forget to take your blood pressure medicine with a sip of water the morning of the procedure. If your Diastolic (lower reading)is above 100 mmHg, elective cases will be cancelled/rescheduled. . Blood thinners: These will need to be stopped for procedures. Notify our staff if you are taking any blood thinners. Depending on which one you take, there will be specific instructions on how and when to stop it. . Diabetics on insulin: Notify the staff so that you can be scheduled 1st case in the morning. If your diabetes requires high dose insulin, take only  of your normal insulin dose the morning of the procedure and notify the staff that you have done so. . Preventing infections: Shower with an antibacterial soap the morning of your procedure.  . Build-up your immune system: Take 1000 mg of Vitamin C with every meal (3 times a day) the day prior to your procedure. . Antibiotics: Inform the staff if you have a condition or reason that requires you to take antibiotics before dental procedures. . Pregnancy: If you are pregnant, call and cancel the procedure. . Sickness: If you have a cold, fever, or any active infections, call and cancel the procedure. . Arrival: You must be in the facility at least 30 minutes prior to your scheduled procedure. . Children: Do not bring any children with you. . Dress  appropriately: Bring dark clothing that you would not mind if they get stained. . Valuables: Do not bring any jewelry or valuables.  Reasons to call and reschedule or cancel your procedure: (Following these recommendations will minimize the risk of a serious complication.) . Surgeries: Avoid having procedures within 2 weeks of any surgery. (Avoid for 2 weeks before or after any surgery). . Flu Shots: Avoid having procedures within 2 weeks of a flu shots or . (Avoid for 2 weeks before or after immunizations). . Barium: Avoid having a procedure within 7-10 days after having had a radiological study involving the use of radiological contrast. (Myelograms, Barium swallow or enema study). . Heart attacks: Avoid any elective procedures or surgeries for the initial 6 months after a "Myocardial Infarction" (Heart Attack). . Blood thinners: It is imperative that you stop these medications before procedures. Let us know if you if you take any blood thinner.  . Infection: Avoid procedures during or within two weeks of an infection (including chest colds or gastrointestinal problems). Symptoms associated with infections include: Localized redness, fever, chills, night sweats or profuse sweating, burning sensation when voiding, cough, congestion, stuffiness, runny nose, sore throat, diarrhea, nausea, vomiting, cold or Flu symptoms, recent or current infections. It is specially important if the infection is over the area that we intend to treat. . Heart and lung problems: Symptoms that may suggest an active cardiopulmonary problem include: cough, chest pain, breathing difficulties or shortness of breath, dizziness, ankle swelling, uncontrolled high or unusually low blood pressure, and/or palpitations. If you are experiencing any of these symptoms, cancel your procedure and contact your primary care physician for an evaluation.  Remember:  Regular   Business hours are:  Monday to Thursday 8:00 AM to 4:00  PM  Provider's Schedule: Milinda Pointer, MD:  Procedure days: Tuesday and Thursday 7:30 AM to 4:00 PM  Gillis Santa, MD:  Procedure days: Monday and Wednesday 7:30 AM to 4:00 PM ____________________________________________________________________________________________   ____________________________________________________________________________________________  General Risks and Possible Complications  Patient Responsibilities: It is important that you read this as it is part of your informed consent. It is our duty to inform you of the risks and possible complications associated with treatments offered to you. It is your responsibility as a patient to read this and to ask questions about anything that is not clear or that you believe was not covered in this document.  Patient's Rights: You have the right to refuse treatment. You also have the right to change your mind, even after initially having agreed to have the treatment done. However, under this last option, if you wait until the last second to change your mind, you may be charged for the materials used up to that point.  Introduction: Medicine is not an Chief Strategy Officer. Everything in Medicine, including the lack of treatment(s), carries the potential for danger, harm, or loss (which is by definition: Risk). In Medicine, a complication is a secondary problem, condition, or disease that can aggravate an already existing one. All treatments carry the risk of possible complications. The fact that a side effects or complications occurs, does not imply that the treatment was conducted incorrectly. It must be clearly understood that these can happen even when everything is done following the highest safety standards.  No treatment: You can choose not to proceed with the proposed treatment alternative. The "PRO(s)" would include: avoiding the risk of complications associated with the therapy. The "CON(s)" would include: not getting any of the  treatment benefits. These benefits fall under one of three categories: diagnostic; therapeutic; and/or palliative. Diagnostic benefits include: getting information which can ultimately lead to improvement of the disease or symptom(s). Therapeutic benefits are those associated with the successful treatment of the disease. Finally, palliative benefits are those related to the decrease of the primary symptoms, without necessarily curing the condition (example: decreasing the pain from a flare-up of a chronic condition, such as incurable terminal cancer).  General Risks and Complications: These are associated to most interventional treatments. They can occur alone, or in combination. They fall under one of the following six (6) categories: no benefit or worsening of symptoms; bleeding; infection; nerve damage; allergic reactions; and/or death. 1. No benefits or worsening of symptoms: In Medicine there are no guarantees, only probabilities. No healthcare provider can ever guarantee that a medical treatment will work, they can only state the probability that it may. Furthermore, there is always the possibility that the condition may worsen, either directly, or indirectly, as a consequence of the treatment. 2. Bleeding: This is more common if the patient is taking a blood thinner, either prescription or over the counter (example: Goody Powders, Fish oil, Aspirin, Garlic, etc.), or if suffering a condition associated with impaired coagulation (example: Hemophilia, cirrhosis of the liver, low platelet counts, etc.). However, even if you do not have one on these, it can still happen. If you have any of these conditions, or take one of these drugs, make sure to notify your treating physician. 3. Infection: This is more common in patients with a compromised immune system, either due to disease (example: diabetes, cancer, human immunodeficiency virus [HIV], etc.), or due to medications or treatments (example: therapies used  to treat cancer and rheumatological diseases). However, even if you do not have one on these, it can still happen. If you have any of these conditions, or take one of these drugs, make sure to notify your treating physician. 4. Nerve Damage: This is more common when the treatment is an invasive one, but it can also happen with the use of medications, such as those used in the treatment of cancer. The damage can occur to small secondary nerves, or to large primary ones, such as those in the spinal cord and brain. This damage may be temporary or permanent and it may lead to impairments that can range from temporary numbness to permanent paralysis and/or brain death. 5. Allergic Reactions: Any time a substance or material comes in contact with our body, there is the possibility of an allergic reaction. These can range from a mild skin rash (contact dermatitis) to a severe systemic reaction (anaphylactic reaction), which can result in death. 6. Death: In general, any medical intervention can result in death, most of the time due to an unforeseen complication. ____________________________________________________________________________________________  ____________________________________________________________________________________________  Medication Rules  Purpose: To inform patients, and their family members, of our rules and regulations.  Applies to: All patients receiving prescriptions (written or electronic).  Pharmacy of record: Pharmacy where electronic prescriptions will be sent. If written prescriptions are taken to a different pharmacy, please inform the nursing staff. The pharmacy listed in the electronic medical record should be the one where you would like electronic prescriptions to be sent.  Electronic prescriptions: In compliance with the [place] (STOP) Act of 2017 (Session Lanny Cramp [phone]), effective April 22, 2018, all controlled  substances must be electronically prescribed. Calling prescriptions to the pharmacy will cease to exist.  Prescription refills: Only during scheduled appointments. Applies to all prescriptions.  NOTE: The following applies primarily to controlled substances (Opioid* Pain Medications).   Type of encounter (visit): For patients receiving controlled substances, face-to-face visits are required. (Not an option or up to the patient.)  Patient's responsibilities: 1. Pain Pills: Bring all pain pills to every appointment (except for procedure appointments). 2. Pill Bottles: Bring pills in original pharmacy bottle. Always bring the newest bottle. Bring bottle, even if empty. 3. Medication refills: You are responsible for knowing and keeping track of what medications you take and those you need refilled. The day before your appointment: write a list of all prescriptions that need to be refilled. The day of the appointment: give the list to the admitting nurse. Prescriptions will be written only during appointments. No prescriptions will be written on procedure days. If you forget a medication: it will not be "Called in", "Faxed", or "electronically sent". You will need to get another appointment to get these prescribed. No early refills. Do not call asking to have your prescription filled early. 4. Prescription Accuracy: You are responsible for carefully inspecting your prescriptions before leaving our office. Have the discharge nurse carefully go over each prescription with you, before taking them home. Make sure that your name is accurately spelled, that your address is correct. Check the name and dose of your medication to make sure it is accurate. Check the number of pills, and the written instructions to make sure they are clear and accurate. Make sure that you are given enough medication to last until your next medication refill appointment. 5. Taking Medication: Take medication as prescribed. When it  comes to controlled substances, taking less pills or less frequently than prescribed is  permitted and encouraged. Never take more pills than instructed. Never take medication more frequently than prescribed.  6. Inform other Doctors: Always inform, all of your healthcare providers, of all the medications you take. 7. Pain Medication from other Providers: You are not allowed to accept any additional pain medication from any other Doctor or Healthcare provider. There are two exceptions to this rule. (see below) In the event that you require additional pain medication, you are responsible for notifying us, as stated below. 8. Cough Medicine: Often these contain an opioid, such as codeine or hydrocodone. Never accept or take cough medicine containing these opioids if you are already taking an opioid* medication. The combination may cause respiratory failure and death. 9. Medication Agreement: You are responsible for carefully reading and following our Medication Agreement. This must be signed before receiving any prescriptions from our practice. Safely store a copy of your signed Agreement. Violations to the Agreement will result in no further prescriptions. (Additional copies of our Medication Agreement are available upon request.) 10. Laws, Rules, & Regulations: All patients are expected to follow all Federal and [employer], [employer], Rules, [employer] [facility]. Ignorance of the Laws does not constitute a valid excuse.  11. Illegal drugs and Controlled Substances: The use of illegal substances (including, but not limited to marijuana and its derivatives) and/or the illegal use of any controlled substances is strictly prohibited. Violation of this rule may result in the immediate and permanent discontinuation of any and all prescriptions being written by our practice. The use of any illegal substances is prohibited. 12. Adopted CDC guidelines & recommendations: Target dosing levels will be at or below 60 MME/day.  Use of benzodiazepines** is not recommended.  Exceptions: There are only two exceptions to the rule of not receiving pain medications from other Healthcare Providers. 1. Exception #1 (Emergencies): In the event of an emergency (i.e.: accident requiring emergency care), you are allowed to receive additional pain medication. However, you are responsible for: As soon as you are able, call our office [phone], at any time of the day or night, and leave a message stating your name, the date and nature of the emergency, and the name and dose of the medication prescribed. In the event that your call is answered by a member of our staff, make sure to document and save the date, time, and the name of the person that took your information.  2. Exception #2 (Planned Surgery): In the event that you are scheduled by another doctor or dentist to have any type of surgery or procedure, you are allowed (for a period no longer than 30 days), to receive additional pain medication, for the acute post-op pain. However, in this case, you are responsible for picking up a copy of our "Post-op Pain Management for Surgeons" handout, and giving it to your surgeon or dentist. This document is available at our office, and does not require an appointment to obtain it. Simply go to our office during business hours (Monday-Thursday from 8:00 AM to 4:00 PM) (Friday 8:00 AM to 12:00 Noon) or if you have a scheduled appointment with [place], prior to your surgery, and ask for it by name. In addition, you are responsible for: calling our office [phone], at any time of the day or night, and leaving a message stating your name, name of your surgeon, type of surgery, and date of procedure or surgery. Failure to comply with your responsibilities may result in termination of therapy involving the controlled substances.  *Opioid  medications include: morphine, codeine, oxycodone, oxymorphone, hydrocodone, hydromorphone, meperidine, tramadol,  tapentadol, buprenorphine, fentanyl, methadone. **Benzodiazepine medications include: diazepam (Valium), alprazolam (Xanax), clonazepam (Klonopine), lorazepam (Ativan), clorazepate (Tranxene), chlordiazepoxide (Librium), estazolam (Prosom), oxazepam (Serax), temazepam (Restoril), triazolam (Halcion) (Last updated: 03/20/2020) ____________________________________________________________________________________________   ____________________________________________________________________________________________  Medication Recommendations and Reminders  Applies to: All patients receiving prescriptions (written and/or electronic).  Medication Rules & Regulations: These rules and regulations exist for your safety and that of others. They are not flexible and neither are we. Dismissing or ignoring them will be considered "non-compliance" with medication therapy, resulting in complete and irreversible termination of such therapy. (See document titled "Medication Rules" for more details.) In all conscience, because of safety reasons, we cannot continue providing a therapy where the patient does not follow instructions.  Pharmacy of record:   Definition: This is the pharmacy where your electronic prescriptions will be sent.   We do not endorse any particular pharmacy, however, we have experienced problems with [facility] not securing enough medication supply for the community.  We do not restrict you in your choice of pharmacy. However, once we write for your prescriptions, we will NOT be re-sending more prescriptions to fix restricted supply problems created by your pharmacy, or your insurance.   The pharmacy listed in the electronic medical record should be the one where you want electronic prescriptions to be sent.  If you choose to change pharmacy, simply notify our nursing staff.  Recommendations:  Keep all of your pain medications in a safe place, under lock and key, even if you live alone.  We will NOT replace lost, stolen, or damaged medication.  After you fill your prescription, take 1 week's worth of pills and put them away in a safe place. You should keep a separate, properly labeled bottle for this purpose. The remainder should be kept in the original bottle. Use this as your primary supply, until it runs out. Once it's gone, then you know that you have 1 week's worth of medicine, and it is time to come in for a prescription refill. If you do this correctly, it is unlikely that you will ever run out of medicine.  To make sure that the above recommendation works, it is very important that you make sure your medication refill appointments are scheduled at least 1 week before you run out of medicine. To do this in an effective manner, make sure that you do not leave the office without scheduling your next medication management appointment. Always ask the nursing staff to show you in your prescription , when your medication will be running out. Then arrange for the receptionist to get you a return appointment, at least 7 days before you run out of medicine. Do not wait until you have 1 or 2 pills left, to come in. This is very poor planning and does not take into consideration that we may need to cancel appointments due to bad weather, sickness, or emergencies affecting our staff.  DO NOT ACCEPT A "Partial Fill": If for any reason your pharmacy does not have enough pills/tablets to completely fill or refill your prescription, do not allow for a "partial fill". The law allows the pharmacy to complete that prescription within 72 hours, without requiring a new prescription. If they do not fill the rest of your prescription within those 72 hours, you will need a separate prescription to fill the remaining amount, which we will NOT provide. If the reason for the partial fill is your insurance, you will need to  talk to the pharmacist about payment alternatives for the remaining tablets, but again, DO  NOT ACCEPT A PARTIAL FILL, unless you can trust your pharmacist to obtain the remainder of the pills within 72 hours.  Prescription refills and/or changes in medication(s):   Prescription refills, and/or changes in dose or medication, will be conducted only during scheduled medication management appointments. (Applies to both, written and electronic prescriptions.)  No refills on procedure days. No medication will be changed or started on procedure days. No changes, adjustments, and/or refills will be conducted on a procedure day. Doing so will interfere with the diagnostic portion of the procedure.  No phone refills. No medications will be "called into the pharmacy".  No Fax refills.  No weekend refills.  No Holliday refills.  No after hours refills.  Remember:  Business hours are:  Monday to Thursday 8:00 AM to 4:00 PM Provider's Schedule: Milinda Pointer, MD - Appointments are:  Medication management: Monday and Wednesday 8:00 AM to 4:00 PM Procedure day: Tuesday and Thursday 7:30 AM to 4:00 PM Gillis Santa, MD - Appointments are:  Medication management: Tuesday and Thursday 8:00 AM to 4:00 PM Procedure day: Monday and Wednesday 7:30 AM to 4:00 PM (Last update: 11/10/2019) ____________________________________________________________________________________________   ____________________________________________________________________________________________  CBD (cannabidiol) WARNING  Applicable to: All individuals currently taking or considering taking CBD (cannabidiol) and, more important, all patients taking opioid analgesic controlled substances (pain medication). (Example: oxycodone; oxymorphone; hydrocodone; hydromorphone; morphine; methadone; tramadol; tapentadol; fentanyl; buprenorphine; butorphanol; dextromethorphan; meperidine; codeine; etc.)  Legal status: CBD remains a Schedule I drug prohibited for any use. CBD is illegal with one exception. In the [place], CBD has a limited Transport planner (FDA) approval for the treatment of two specific types of epilepsy disorders. Only one CBD product has been approved by the FDA for this purpose: "Epidiolex". FDA is aware that some companies are marketing products containing cannabis and cannabis-derived compounds in ways that violate the [employer], Drug and Cosmetic Act [facility] Act) and that may put the health and safety of consumers at risk. The FDA, a Federal agency, has not enforced the CBD status since 2018.   Legality: Some manufacturers ship CBD products nationally, which is illegal. Often such products are sold online and are therefore available throughout the country. CBD is openly sold in head shops and health food stores in some states where such sales have not been explicitly legalized. Selling unapproved products with unsubstantiated therapeutic claims is not only a violation of the law, but also can put patients at risk, as these products have not been proven to be safe or effective. Federal illegality makes it difficult to conduct research on CBD.  Reference: "FDA Regulation of Cannabis and Cannabis-Derived Products, Including Cannabidiol (CBD)" - [employer]  Warning: CBD is not FDA approved and has not undergo the same manufacturing controls as prescription drugs.  This means that the purity and safety of available CBD may be questionable. Most of the time, despite manufacturer's claims, it is contaminated with THC (delta-9-tetrahydrocannabinol - the chemical in marijuana responsible for the "HIGH").  When this is the case, the [facility] contaminant will trigger a positive urine drug screen (UDS) test for Marijuana (carboxy-THC). Because a positive UDS for any illicit substance is a violation of our medication agreement, your opioid analgesics (pain medicine) may be  permanently discontinued.  MORE ABOUT CBD  General Information: CBD  is a derivative of the Marijuana (cannabis sativa) plant discovered in 72. It is one of the  the 113 identified substances found in Marijuana. It accounts for up to 40% of the plant's extract. As of 2018, preliminary clinical studies on CBD included research for the treatment of anxiety, movement disorders, and pain. CBD is available and consumed in multiple forms, including inhalation of smoke or vapor, as an aerosol spray, and by mouth. It may be supplied as an oil containing CBD, capsules, dried cannabis, or as a liquid solution. CBD is thought not to be as psychoactive as THC (delta-9-tetrahydrocannabinol - the chemical in marijuana responsible for the "HIGH"). Studies suggest that CBD may interact with different biological target receptors in the body, including cannabinoid and other neurotransmitter receptors. As of 2018 the mechanism of action for its biological effects has not been determined.  Side-effects  Adverse reactions: Dry mouth, diarrhea, decreased appetite, fatigue, drowsiness, malaise, weakness, sleep disturbances, and others.  Drug interactions: CBC may interact with other medications such as blood-thinners. (Last update: 11/27/2019) ____________________________________________________________________________________________   ____________________________________________________________________________________________  Drug Holidays (Slow)  What is a "Drug Holiday"? Drug Holiday: is the name given to the period of time during which a patient stops taking a medication(s) for the purpose of eliminating tolerance to the drug.  Benefits . Improved effectiveness of opioids. . Decreased opioid dose needed to achieve benefits. . Improved pain with lesser dose.  What is tolerance? Tolerance: is the progressive decreased in effectiveness of a drug due to its repetitive use. With repetitive use, the body gets use  to the medication and as a consequence, it loses its effectiveness. This is a common problem seen with opioid pain medications. As a result, a larger dose of the drug is needed to achieve the same effect that used to be obtained with a smaller dose.  How long should a "Drug Holiday" last? You should stay off of the pain medicine for at least 14 consecutive days. (2 weeks)  Should I stop the medicine "cold turkey"? No. You should always coordinate with your Pain Specialist so that he/she can provide you with the correct medication dose to make the transition as smoothly as possible.  How do I stop the medicine? Slowly. You will be instructed to decrease the daily amount of pills that you take by one (1) pill every seven (7) days. This is called a "slow downward taper" of your dose. For example: if you normally take four (4) pills per day, you will be asked to drop this dose to three (3) pills per day for seven (7) days, then to two (2) pills per day for seven (7) days, then to one (1) per day for seven (7) days, and at the end of those last seven (7) days, this is when the "Drug Holiday" would start.   Will I have withdrawals? By doing a "slow downward taper" like this one, it is unlikely that you will experience any significant withdrawal symptoms. Typically, what triggers withdrawals is the sudden stop of a high dose opioid therapy. Withdrawals can usually be avoided by slowly decreasing the dose over a prolonged period of time. If you do not follow these instructions and decide to stop your medication abruptly, withdrawals may be possible.  What are withdrawals? Withdrawals: refers to the wide range of symptoms that occur after stopping or dramatically reducing opiate drugs after heavy and prolonged use. Withdrawal symptoms do not occur to patients that use low dose opioids, or those who take the medication sporadically. Contrary to benzodiazepine (example: Valium, Xanax, etc.) or alcohol  withdrawals ("Delirium Tremens"), opioid withdrawals   are not lethal. Withdrawals are the physical manifestation of the body getting rid of the excess receptors.  Expected Symptoms Early symptoms of withdrawal may include: . Agitation . Anxiety . Muscle aches . Increased tearing . Insomnia . Runny nose . Sweating . Yawning  Late symptoms of withdrawal may include: . Abdominal cramping . Diarrhea . Dilated pupils . Goose bumps . Nausea . Vomiting  Will I experience withdrawals? Due to the slow nature of the taper, it is very unlikely that you will experience any.  What is a slow taper? Taper: refers to the gradual decrease in dose.  (Last update: 11/10/2019) ____________________________________________________________________________________________     

## 2020-06-05 NOTE — Progress Notes (Signed)
Nursing Pain Medication Assessment:  Safety precautions to be maintained throughout the outpatient stay will include: orient to surroundings, keep bed in low position, maintain call bell within reach at all times, provide assistance with transfer out of bed and ambulation.  Medication Inspection Compliance: Pill count conducted under aseptic conditions, in front of the patient. Neither the pills nor the bottle was removed from the patient's sight at any time. Once count was completed pills were immediately returned to the patient in their original bottle.  Medication: Oxycodone IR Pill/Patch Count: 8 of 90 pills remain Pill/Patch Appearance: Markings consistent with prescribed medication Bottle Appearance: Standard pharmacy container. Clearly labeled. Filled Date: 01 / 17 / 2022 Last Medication intake:  Today

## 2020-06-07 MED FILL — oxyCODONE HCL 5 MG TABS: 5 | 30 days supply | Qty: 90 | Fill #0

## 2020-06-07 MED FILL — BACLOFEN 10 MG TABS: 10 | 30 days supply | Qty: 240 | Fill #0

## 2020-06-12 ENCOUNTER — Other Ambulatory Visit: Payer: Self-pay | Admitting: Oncology

## 2020-06-12 ENCOUNTER — Other Ambulatory Visit: Payer: Self-pay | Admitting: *Deleted

## 2020-06-12 MED ORDER — SERTRALINE HCL 25 MG PO TABS: 25.0000 mg | ORAL_TABLET | Freq: Every day | ORAL | 2 refills | Status: DC

## 2020-06-12 MED FILL — SERTRALINE HCL 25 MG TABLET: 25 | 30 days supply | Qty: 30 | Fill #0

## 2020-06-13 ENCOUNTER — Ambulatory Visit: Payer: Self-pay | Admitting: Pain Medicine

## 2020-06-13 DIAGNOSIS — S53441S Ulnar collateral ligament sprain of right elbow, sequela: Secondary | ICD-10-CM | POA: Insufficient documentation

## 2020-06-13 DIAGNOSIS — Z5329 Procedure and treatment not carried out because of patient's decision for other reasons: Secondary | ICD-10-CM | POA: Insufficient documentation

## 2020-06-13 DIAGNOSIS — S53441D Ulnar collateral ligament sprain of right elbow, subsequent encounter: Secondary | ICD-10-CM | POA: Insufficient documentation

## 2020-06-13 DIAGNOSIS — M7702 Medial epicondylitis, left elbow: Secondary | ICD-10-CM | POA: Insufficient documentation

## 2020-06-13 DIAGNOSIS — Z91199 Patient's noncompliance with other medical treatment and regimen due to unspecified reason: Secondary | ICD-10-CM | POA: Insufficient documentation

## 2020-06-13 DIAGNOSIS — M7701 Medial epicondylitis, right elbow: Secondary | ICD-10-CM | POA: Insufficient documentation

## 2020-06-13 DIAGNOSIS — S53442D Ulnar collateral ligament sprain of left elbow, subsequent encounter: Secondary | ICD-10-CM | POA: Insufficient documentation

## 2020-06-13 NOTE — Progress Notes (Deleted)
No-show to procedure  

## 2020-06-30 MED FILL — PREGABALIN 150 MG CAPS: 150 | 90 days supply | Qty: 270 | Fill #0

## 2020-07-14 ENCOUNTER — Other Ambulatory Visit: Payer: Self-pay | Admitting: *Deleted

## 2020-07-14 DIAGNOSIS — C9001 Multiple myeloma in remission: Secondary | ICD-10-CM

## 2020-07-14 MED ORDER — LENALIDOMIDE 5 MG PO CAPS: 5.0000 mg | ORAL_CAPSULE | Freq: Every day | ORAL | 0 refills | Status: DC

## 2020-07-17 MED ORDER — LENALIDOMIDE 5 MG PO CAPS: 5.0000 mg | ORAL_CAPSULE | Freq: Every day | ORAL | 0 refills | Status: DC

## 2020-07-17 MED FILL — SERTRALINE HCL 25 MG TABLET: 25 | 30 days supply | Qty: 30 | Fill #1

## 2020-07-17 NOTE — Addendum Note (Signed)
Addended by: Darl Pikes on: 07/17/2020 09:06 AM   Modules accepted: Orders

## 2020-07-27 ENCOUNTER — Other Ambulatory Visit (HOSPITAL_COMMUNITY): Payer: Self-pay

## 2020-07-28 ENCOUNTER — Other Ambulatory Visit: Payer: Self-pay

## 2020-07-28 ENCOUNTER — Ambulatory Visit
Admission: RE | Admit: 2020-07-28 | Discharge: 2020-07-28 | Disposition: A | Discharge status: Home / Self Care | Payer: No Typology Code available for payment source | Source: Ambulatory Visit | Attending: Oncology | Admitting: Oncology

## 2020-07-28 ENCOUNTER — Inpatient Hospital Stay: Payer: No Typology Code available for payment source | Attending: Oncology

## 2020-07-28 ENCOUNTER — Ambulatory Visit
Admission: RE | Admit: 2020-07-28 | Discharge: 2020-07-28 | Disposition: A | Discharge status: Home / Self Care | Payer: No Typology Code available for payment source | Attending: Oncology | Admitting: Oncology

## 2020-07-28 DIAGNOSIS — C9001 Multiple myeloma in remission: Secondary | ICD-10-CM | POA: Insufficient documentation

## 2020-07-28 DIAGNOSIS — T451X5A Adverse effect of antineoplastic and immunosuppressive drugs, initial encounter: Secondary | ICD-10-CM | POA: Insufficient documentation

## 2020-07-28 DIAGNOSIS — G620 Drug-induced polyneuropathy: Secondary | ICD-10-CM | POA: Insufficient documentation

## 2020-07-28 DIAGNOSIS — Z9484 Stem cells transplant status: Secondary | ICD-10-CM | POA: Insufficient documentation

## 2020-07-28 DIAGNOSIS — N183 Chronic kidney disease, stage 3 unspecified: Secondary | ICD-10-CM | POA: Insufficient documentation

## 2020-07-28 DIAGNOSIS — I129 Hypertensive chronic kidney disease with stage 1 through stage 4 chronic kidney disease, or unspecified chronic kidney disease: Secondary | ICD-10-CM | POA: Insufficient documentation

## 2020-07-28 LAB — CBC WITH DIFFERENTIAL/PLATELET
Abs Immature Granulocytes: 0.02 10*3/uL (ref 0.00–0.07)
Basophils Absolute: 0 10*3/uL (ref 0.0–0.1)
Basophils Relative: 1 %
Eosinophils Absolute: 0.2 10*3/uL (ref 0.0–0.5)
Eosinophils Relative: 5 %
HCT: 42.6 % (ref 39.0–52.0)
Hemoglobin: 15.2 g/dL (ref 13.0–17.0)
Immature Granulocytes: 1 %
Lymphocytes Relative: 17 %
Lymphs Abs: 0.6 10*3/uL — ABNORMAL LOW (ref 0.7–4.0)
MCH: 33.4 pg (ref 26.0–34.0)
MCHC: 35.7 g/dL (ref 30.0–36.0)
MCV: 93.6 fL (ref 80.0–100.0)
Monocytes Absolute: 0.7 10*3/uL (ref 0.1–1.0)
Monocytes Relative: 18 %
Neutro Abs: 2.2 10*3/uL (ref 1.7–7.7)
Neutrophils Relative %: 58 %
Platelets: 170 10*3/uL (ref 150–400)
RBC: 4.55 MIL/uL (ref 4.22–5.81)
RDW: 14.2 % (ref 11.5–15.5)
WBC: 3.8 10*3/uL — ABNORMAL LOW (ref 4.0–10.5)
nRBC: 0 % (ref 0.0–0.2)

## 2020-07-28 LAB — COMPREHENSIVE METABOLIC PANEL
ALT: 22 U/L (ref 0–44)
AST: 27 U/L (ref 15–41)
Albumin: 4.4 g/dL (ref 3.5–5.0)
Alkaline Phosphatase: 64 U/L (ref 38–126)
Anion gap: 7 (ref 5–15)
BUN: 17 mg/dL (ref 6–20)
CO2: 28 mmol/L (ref 22–32)
Calcium: 8.8 mg/dL — ABNORMAL LOW (ref 8.9–10.3)
Chloride: 102 mmol/L (ref 98–111)
Creatinine, Ser: 1.47 mg/dL — ABNORMAL HIGH (ref 0.61–1.24)
GFR, Estimated: 58 mL/min — ABNORMAL LOW (ref >60)
Glucose, Bld: 84 mg/dL (ref 70–99)
Potassium: 3.5 mmol/L (ref 3.5–5.1)
Sodium: 137 mmol/L (ref 135–145)
Total Bilirubin: 0.8 mg/dL (ref 0.3–1.2)
Total Protein: 7 g/dL (ref 6.5–8.1)

## 2020-07-30 ENCOUNTER — Other Ambulatory Visit: Payer: Self-pay | Admitting: *Deleted

## 2020-07-30 DIAGNOSIS — C9001 Multiple myeloma in remission: Secondary | ICD-10-CM

## 2020-07-31 ENCOUNTER — Other Ambulatory Visit: Payer: Self-pay

## 2020-07-31 ENCOUNTER — Inpatient Hospital Stay (HOSPITAL_BASED_OUTPATIENT_CLINIC_OR_DEPARTMENT_OTHER): Payer: No Typology Code available for payment source | Admitting: Oncology

## 2020-07-31 VITALS — BP 175/99 | HR 76 | Temp 98.6°F | Resp 16 | Ht 67.0 in | Wt 163.9 lb

## 2020-07-31 DIAGNOSIS — Z79899 Other long term (current) drug therapy: Secondary | ICD-10-CM

## 2020-07-31 DIAGNOSIS — Z9484 Stem cells transplant status: Secondary | ICD-10-CM | POA: Diagnosis not present

## 2020-07-31 DIAGNOSIS — I129 Hypertensive chronic kidney disease with stage 1 through stage 4 chronic kidney disease, or unspecified chronic kidney disease: Secondary | ICD-10-CM | POA: Diagnosis not present

## 2020-07-31 DIAGNOSIS — G620 Drug-induced polyneuropathy: Secondary | ICD-10-CM | POA: Diagnosis not present

## 2020-07-31 DIAGNOSIS — N183 Chronic kidney disease, stage 3 unspecified: Secondary | ICD-10-CM | POA: Diagnosis not present

## 2020-07-31 DIAGNOSIS — C9001 Multiple myeloma in remission: Secondary | ICD-10-CM

## 2020-07-31 DIAGNOSIS — T451X5A Adverse effect of antineoplastic and immunosuppressive drugs, initial encounter: Secondary | ICD-10-CM | POA: Diagnosis not present

## 2020-07-31 LAB — MULTIPLE MYELOMA PANEL, SERUM
Albumin SerPl Elph-Mcnc: 3.8 g/dL (ref 2.9–4.4)
Albumin/Glob SerPl: 1.4 (ref 0.7–1.7)
Alpha 1: 0.2 g/dL (ref 0.0–0.4)
Alpha2 Glob SerPl Elph-Mcnc: 0.7 g/dL (ref 0.4–1.0)
B-Globulin SerPl Elph-Mcnc: 1 g/dL (ref 0.7–1.3)
Gamma Glob SerPl Elph-Mcnc: 0.8 g/dL (ref 0.4–1.8)
Globulin, Total: 2.8 g/dL (ref 2.2–3.9)
IgA: 165 mg/dL (ref 90–386)
IgG (Immunoglobin G), Serum: 834 mg/dL (ref 603–1613)
IgM (Immunoglobulin M), Srm: 44 mg/dL (ref 20–172)
Total Protein ELP: 6.6 g/dL (ref 6.0–8.5)

## 2020-07-31 LAB — KAPPA/LAMBDA LIGHT CHAINS
Kappa free light chain: 18.2 mg/L (ref 3.3–19.4)
Kappa, lambda light chain ratio: 1.72 — ABNORMAL HIGH (ref 0.26–1.65)
Lambda free light chains: 10.6 mg/L (ref 5.7–26.3)

## 2020-07-31 NOTE — Progress Notes (Signed)
Hematology/Oncology Consult note [facility]  [phone] [phone]  Patient Care Team: Pleas Koch, NP as PCP - General (Internal Medicine) Vella Redhead, MD as PCP - Hematology/Oncology Sindy Guadeloupe, MD as Consulting Physician (Hematology and Oncology)   Name of the patient: James House  [id number]  01/18/1971   Date of visit: 07/31/20  Diagnosis- light chain kappa multiple myeloma s/p ASCT currently in remission. Patient on maintenance revlimid   Chief complaint/ Reason for visit-routine follow-up of multiple myeloma  Heme/Onc history: Patient is a 50 year old male with a history of kappa light chain myeloma that was treated by [name]. History is as follows:  1. Patient presented with renal insufficiency with a creatinine of 2.4 in April 2018. Kidney biopsy showed myeloma cast nephropathy. Kappa light chain was 3004 and lambda 0.22 with a kappa lambda ratio of 13,000 655. Skeletal survey showed multiple calvarial and marrow lesions. Bone marrow biopsy in May 2018 showed 43% plasma cells. He received CyBorDfor cycle 1 in May 2018 and was subsequently switched to RVD. He received 4 cycles followed by a repeat bone marrow biopsy in August 2018 which showed no increase in plasma cells.  2.He underwent autologous stem cell transplantation on 01/30/2017. He was started on Revlimid maintenance 10 mg daily in January 2019.  3.Labs from January February and March 2019 done at [facility] showed no M spike, normal kappa lambda ratio of 1.06. He was last seen by them in April 2019.  Following that patient was admitted to [facility] for healthcare associated pneumonia and was recently discharged. He wished to transfer his care to [place] at this time.  With regards to his myeloma patient is currently on 10 mg of Revlimid daily. He is also on acyclovir 400 mg twice daily which he is to continue for a minimum of 1 year post  transplant. Patient has chronic back pain as well as chemo-induced peripheral neuropathy for which he was seen by [name] in the past and is currently seeing [facility] pain clinic and is under pain contract with them  Interval history-patient reports chronic fatigue which is essentially unchanged.  Has chronic back pain as well as peripheral neuropathy for which she sees pain clinic.  ECOG PS- 1 Pain scale- 8   Review of systems- Review of Systems  Constitutional: Positive for malaise/fatigue. Negative for chills, fever and weight loss.  HENT: Negative for congestion, ear discharge and nosebleeds.   Eyes: Negative for blurred vision.  Respiratory: Negative for cough, hemoptysis, sputum production, shortness of breath and wheezing.   Cardiovascular: Negative for chest pain, palpitations, orthopnea and claudication.  Gastrointestinal: Negative for abdominal pain, blood in stool, constipation, diarrhea, heartburn, melena, nausea and vomiting.  Genitourinary: Negative for dysuria, flank pain, frequency, hematuria and urgency.  Musculoskeletal: Positive for back pain. Negative for joint pain and myalgias.  Skin: Negative for rash.  Neurological: Positive for sensory change (Peripheral neuropathy). Negative for dizziness, tingling, focal weakness, seizures, weakness and headaches.  Endo/Heme/Allergies: Does not bruise/bleed easily.  Psychiatric/Behavioral: Negative for depression and suicidal ideas. The patient does not have insomnia.       No Known Allergies   Past Medical History:  Diagnosis Date  . CKD (chronic kidney disease) stage 3, GFR 30-59 ml/min (HCC)   . Hypertension   . Multiple myeloma ([place]) 08/11/2017  . Neuropathy   . Pneumonia      Past Surgical History:  Procedure Laterality Date  . ABDOMINAL SURGERY    .  JOINT REPLACEMENT     right knee  . KNEE SURGERY Left     Social History   Socioeconomic History  . Marital status: Married    Spouse name: [name]  .  Number of children: 3  . Years of education: Not on file  . Highest education level: Not on file  Occupational History  . Not on file  Tobacco Use  . Smoking status: Former Smoker    Quit date: 08/19/2013    Years since quitting: 6.9  . Smokeless tobacco: Current User    Types: Snuff  Vaping Use  . Vaping Use: Never used  Substance and Sexual Activity  . Alcohol use: No  . Drug use: No  . Sexual activity: Yes  Other Topics Concern  . Not on file  Social History Narrative   Live in private residence with spouse and mother in law   Social Determinants of Health   Financial Resource Strain: Not on file  Food Insecurity: Not on file  Transportation Needs: Not on file  Physical Activity: Not on file  Stress: Not on file  Social Connections: Not on file  Intimate Partner Violence: Not on file    Family History  Problem Relation Age of Onset  . Cancer Mother 58       Pancreatic  . COPD Mother   . Diabetes Mother   . Hyperlipidemia Mother   . Hypertension Mother   . Cancer Maternal Uncle        pancreatic  . Cancer Maternal Grandmother 54       colon  . COPD Maternal Grandmother   . Diabetes Maternal Grandmother   . Hypertension Maternal Grandmother      Current Outpatient Medications:  .  aspirin EC 81 MG tablet, Take 81 mg by mouth daily., Disp: , Rfl:  .  baclofen (LIORESAL) 10 MG tablet, TAKE 1 TABLET BY MOUTH FOUR TIMES DAILY AS NEEDED FOR MUSCLE SPASMS, Disp: 360 tablet, Rfl: 0 .  lenalidomide (REVLIMID) 5 MG capsule, Take 1 capsule (5 mg total) by mouth daily. [employer] Auth # [id number] done on 07/14/2020, Disp: 28 capsule, Rfl: 0 .  oxyCODONE (OXY IR/ROXICODONE) 5 MG immediate release tablet, Take 1 tablet (5 mg total) by mouth every 8 (eight) hours as needed for severe pain. Must last 30 days, Disp: 90 tablet, Rfl: 0 .  pregabalin (LYRICA) 150 MG capsule, TAKE 1 CAPSULE BY MOUTH 3 TIMES DAILY, Disp: 270 capsule, Rfl: 0 .  sertraline (ZOLOFT) 25 MG tablet, TAKE 1  TABLET BY MOUTH ONCE A DAY, Disp: 30 tablet, Rfl: 2 .  oxyCODONE (OXY IR/ROXICODONE) 5 MG immediate release tablet, Take 1 tablet (5 mg total) by mouth every 8 (eight) hours as needed for severe pain. Must last 30 days (Patient not taking: Reported on 07/31/2020), Disp: 90 tablet, Rfl: 0 .  [START ON 08/05/2020] oxyCODONE (OXY IR/ROXICODONE) 5 MG immediate release tablet, Take 1 tablet (5 mg total) by mouth every 8 (eight) hours as needed for severe pain. Must last 30 days (Patient not taking: Reported on 07/31/2020), Disp: 90 tablet, Rfl: 0 .  oxyCODONE (OXY IR/ROXICODONE) 5 MG immediate release tablet, TAKE 1 TABLET BY MOUTH EVERY 8 HOURS AS NEEDED FOR SEVERE PAIN, MUST LAST 30 DAYS (FILL 08/05/20) (Patient not taking: Reported on 07/31/2020), Disp: 90 tablet, Rfl: 0 .  oxyCODONE (OXY IR/ROXICODONE) 5 MG immediate release tablet, TAKE 1 TABLET BY MOUTH EVERY 8 HOURS AS NEEDED FOR SEVERE PAIN, MUST LAST 30 DAYS (FILL 07/06/20) (  Patient not taking: Reported on 07/31/2020), Disp: 90 tablet, Rfl: 0 .  oxyCODONE (OXY IR/ROXICODONE) 5 MG immediate release tablet, TAKE 1 TABLET BY MOUTH EVERY 8 HOURS AS NEEDED FOR SEVERE PAIN, MUST LAST 30 DAYS (FILL 06/06/20) (Patient not taking: Reported on 07/31/2020), Disp: 90 tablet, Rfl: 0 .  oxyCODONE (OXY IR/ROXICODONE) 5 MG immediate release tablet, TAKE 1 TABLET BY MOUTH EVERY 8 HOURS AS NEEDED FOR SEVERE PAIN (MUST LAST 30 DAYS) AVOID BENZODIAZEPINES WITHIN 8 HOURS OF TAKING THIS MEDICATION (Patient not taking: Reported on 07/31/2020), Disp: 90 tablet, Rfl: 0 .  oxyCODONE (OXY IR/ROXICODONE) 5 MG immediate release tablet, TAKE 1 TABLET BY MOUTH EVERY 8 HOURS AS NEEDED FOR SEVERE PAIN. MUST LAST 30 DAYS (Patient not taking: Reported on 07/31/2020), Disp: 90 tablet, Rfl: 0 .  oxyCODONE (OXY IR/ROXICODONE) 5 MG immediate release tablet, TAKE 1 TABLET (5 MG TOTAL) BY MOUTH EVERY 8 (EIGHT) HOURS AS NEEDED FOR SEVERE PAIN. MUST LAST 30 DAYS *02/09/20 AVOID BENZOS WITHIN 8 HOURS  (Patient not taking: Reported on 07/31/2020), Disp: 90 tablet, Rfl: 0  Physical exam:  Vitals:   07/31/20 1455  BP: (!) 175/99  Pulse: 76  Resp: 16  Temp: 98.6 F (37 C)  TempSrc: Oral  Weight: 163 lb 14.4 oz (74.3 kg)  Height: _0  (1.702 m)   Physical Exam Constitutional:      General: He is not in acute distress. Cardiovascular:     Rate and Rhythm: Normal rate and regular rhythm.     Heart sounds: Normal heart sounds.  Pulmonary:     Effort: Pulmonary effort is normal.  Skin:    General: Skin is warm and dry.  Neurological:     Mental Status: He is alert and oriented to person, place, and time.      CMP Latest Ref Rng & Units 07/28/2020  Glucose 70 - 99 mg/dL 84  BUN 6 - 20 mg/dL 17  Creatinine 0.61 - 1.24 mg/dL 1.47(H)  Sodium 135 - 145 mmol/L 137  Potassium 3.5 - 5.1 mmol/L 3.5  Chloride 98 - 111 mmol/L 102  CO2 22 - 32 mmol/L 28  Calcium 8.9 - 10.3 mg/dL 8.8(L)  Total Protein 6.5 - 8.1 g/dL 7.0  Total Bilirubin 0.3 - 1.2 mg/dL 0.8  Alkaline Phos 38 - 126 U/L 64  AST 15 - 41 U/L 27  ALT 0 - 44 U/L 22   CBC Latest Ref Rng & Units 07/28/2020  WBC 4.0 - 10.5 K/uL 3.8(L)  Hemoglobin 13.0 - 17.0 g/dL 15.2  Hematocrit 39.0 - 52.0 % 42.6  Platelets 150 - 400 K/uL 170    No images are attached to the encounter.  DG Bone Survey Met  Result Date: 07/31/2020 CLINICAL DATA:  Multiple myeloma EXAM: METASTATIC BONE SURVEY COMPARISON:  October 22, 2018 FINDINGS: Skull: Multiple small lucencies throughout the calvarium appear stable, likely due to multiple myeloma. No progression of lytic change. Sella appears normal. Cervical spine: No appreciable blastic or lytic bone lesions. Thoracic spine: No blastic or lytic bone lesions. Slight anterior wedging at T6, see T7, and T8 again noted without progression. Chest: Lungs are clear. Heart size and pulmonary vascular normal. No adenopathy. Old healed fracture lateral right seventh rib equivocal small lucency in the lateral right  clavicle is stable. No new lytic appearing lesions. No blastic lesions. Lumbar spine: No blastic or lytic bone lesions evident. No fracture or spondylolisthesis. Pelvis: Equivocal lucencies in each femoral neck appear stable. No new or well-defined blastic  or lytic bone lesions. Right femur: Equivocal lucencies in the right femoral neck stable. No new lucency to suggest progression of disease. No abnormal periosteal reaction. Left femur: Equivocal lucency in the femoral neck region stable. No evidence suggesting progression of disease. No bony destruction. No abnormal periosteal reaction. Lateral left hemiarthroplasty noted, stable. Right tibia and fibula: No blastic or lytic bone lesions. No abnormal periosteal reaction. Left tibia and fibula: No blastic or lytic bone lesions. No abnormal periosteal reaction. Right shoulder and humerus: Equivocal lucency in the proximal right humerus and lateral right clavicle stable. No progression of potential lytic change. No abnormal periosteal reaction. No blastic lesions. Left shoulder and humerus: Subtle lytic lesions in the proximal left humerus stable. No progression of lytic change. No abnormal periosteal reaction. No evident blastic lesion. Right forearm: No blastic or lytic bone lesions. No abnormal periosteal reaction. Left forearm: No blastic or lytic bone lesions. No abnormal periosteal reaction. IMPRESSION: In comparison with prior study, multiple small lucencies felt to represent changes of multiple myeloma in the skull appear stable. Somewhat subtle lesions in each proximal humerus and equivocal areas of lucency in each femoral neck and lateral clavicle are stable. No findings suggesting progression of multiple myeloma. No new or enlarging lesions evident. Mild anterior wedging at T6, T7, and T8 is stable. Lungs clear.  No thoracic adenopathy on chest radiograph. Electronically Signed   By: Lowella Grip III M.D.   On: 07/31/2020 09:19     Assessment and  plan- Patient is a 50 y.o. male with free light chain kappa multiple myeloma s/p autologous transplantation currently in remission on maintenance Revlimid here for routine follow-up  Serum kappa light chain is currently within normal limits.  No evidence of anemia.  Calcium levels are normal.  He has baseline CKD with a stable creatinine between 1.4-1.5.  Myeloma panel is presently pending.  He is on low-dose maintenance Revlimid 5 mg daily which she is tolerating it well.I will see him back in 6 months with CBC with differential CMP myeloma panel and serum free light chains.  Patient was on Xgeva for over 3 years which I have kept on hold for now.  Recent bone survey did not reveal any evidence of new bone lesions.  Chemo-induced peripheral neuropathy: [facility] and he is on Lyrica as needed oxycodone.   Visit Diagnosis 1. Multiple myeloma in remission ([place])   2. High risk medication use      Dr. Randa Evens, MD, MPH [facility] [phone] 07/31/2020 3:36 PM

## 2020-07-31 NOTE — Addendum Note (Signed)
Addended by: Kern Alberta on: 07/31/2020 03:49 PM   Modules accepted: Orders

## 2020-07-31 NOTE — Progress Notes (Signed)
Pt is in pain all the time and it is getting worse. He is stressed with going through a separation and wife had kicked him out. He is living with cousin but has several people living there so he is on the couch. Drinks good amount of fluids but mostly tea- sweet. Encouraged him to drink water. Gave him packets to mix with water for flavor and nutrients and then gave him mixed fruit juice ensure to see if he can drink that and it is better for him . He will try it

## 2020-08-03 ENCOUNTER — Other Ambulatory Visit: Payer: Self-pay | Admitting: Primary Care

## 2020-08-03 ENCOUNTER — Other Ambulatory Visit (HOSPITAL_COMMUNITY): Payer: Self-pay

## 2020-08-03 DIAGNOSIS — G8929 Other chronic pain: Secondary | ICD-10-CM

## 2020-08-03 DIAGNOSIS — M62830 Muscle spasm of back: Secondary | ICD-10-CM

## 2020-08-03 MED ORDER — BACLOFEN 10 MG PO TABS
ORAL_TABLET | ORAL | 0 refills | Status: DC
  Filled 2020-08-03: qty 360, 90d supply, fill #0

## 2020-08-03 MED FILL — Oxycodone HCl Tab 5 MG: ORAL | 30 days supply | Qty: 90 | Fill #0 | Status: CN

## 2020-08-04 ENCOUNTER — Other Ambulatory Visit (HOSPITAL_COMMUNITY): Payer: Self-pay

## 2020-08-05 ENCOUNTER — Other Ambulatory Visit (HOSPITAL_COMMUNITY): Payer: Self-pay

## 2020-08-05 MED FILL — Oxycodone HCl Tab 5 MG: ORAL | 30 days supply | Qty: 90 | Fill #0 | Status: AC

## 2020-08-18 ENCOUNTER — Other Ambulatory Visit (HOSPITAL_COMMUNITY): Payer: Self-pay

## 2020-08-18 MED FILL — Sertraline HCl Tab 25 MG: ORAL | 30 days supply | Qty: 30 | Fill #0 | Status: AC

## 2020-08-27 NOTE — Progress Notes (Signed)
PROVIDER NOTE: Information contained herein reflects review and annotations entered in association with encounter. Interpretation of such information and data should be left to medically-trained personnel. Information provided to patient can be located elsewhere in the medical record under "Patient Instructions". Document created using STT-dictation technology, any transcriptional errors that may result from process are unintentional.    Patient: James House.  Service Category: E/M  Provider: Gaspar Cola, MD  DOB: 17-May-1970  DOS: 08/28/2020  Specialty: Interventional Pain Management  MRN: [id number]  Setting: Ambulatory outpatient  PCP: Pleas Koch, NP  Type: Established Patient    Referring Provider: Pleas Koch, NP  Location: Office  Delivery: Face-to-face     HPI  James House., a 50 y.o. year old male, is here today because of his Chronic pain syndrome [G89.4]. James House primary complain today is Back Pain (lower) and Elbow Pain (bilateral) Last encounter: My last encounter with him was on 06/13/2020. Pertinent problems: James House has Multiple myeloma ([place]); Chronic low back pain (Bilateral) (L>R) w/ sciatica (Bilateral); Chronic lower extremity pain (2ry area of Pain) (Bilateral) (L>R); Chronic knee pain (3ry area of Pain) (Bilateral) (L>R); Chronic pain syndrome; Chemotherapy-induced peripheral neuropathy ([place]); Neurogenic pain; Neuropathic pain; History of Partial knee replacement (Left); Chronic knee pain s/p partial replacement (Left); Osteoarthritis of knee (Right); Chronic hip pain (Bilateral) (R>L); Chronic sacroiliac joint pain (Bilateral) (R>L); Other specified dorsopathies, sacral and sacrococcygeal region; Lumbar facet syndrome (Bilateral) (L>R); Spondylosis without myelopathy or radiculopathy, lumbar region; Chronic lumbar radiculopathy (by EMG/PNCV) (L5) (Right); Chronic low back pain (1ry area of Pain) (Bilateral) (L>R) w/o sciatica; Spasm of back muscles;  Chronic musculoskeletal pain; Osteoarthritis of hips (Bilateral); Piriformis syndrome (Left); Piriformis muscle pain (Left); Spasm of piriformis muscle (Left); DDD (degenerative disc disease), lumbosacral; Shingles; Thoracic radiculitis (Right); DDD (degenerative disc disease), thoracic; Non-traumatic compression fracture of T7 thoracic vertebra, sequela; Olecranon bursitis (Bilateral); Chronic elbow joint pain (Medial aspect) (Bilateral); Epicondylitis elbow, medial (Left); Epicondylitis elbow, medial (Right); Medial epicondylitis of elbows (Bilateral); Ulnar collateral ligament sprain of left elbow, subsequent encounter; and Ulnar collateral ligament sprain of right elbow, subsequent encounter on their pertinent problem list. Pain Assessment: Severity of Chronic pain is reported as a 10-Worst pain ever/10. Location: Back Lower/both legs. Onset: More than a month ago. Quality:  . Timing: Constant. Modifying factor(s): lying down, hot shower, heat. Vitals:  height is _0  (1.702 m) and weight is 155 lb (70.3 kg). His temporal temperature is 97.3 F (36.3 C) (abnormal). His blood pressure is 165/101 (abnormal) and his pulse is 91. His respiration is 16 and oxygen saturation is 100%.   Reason for encounter: medication management.   The patient indicates doing well with the current medication regimen. No adverse reactions or side effects reported to the medications.  The patient indicates having a flareup of his bilateral medial elbow epicondylitis.  He has requested where we bring him in for bilateral injection.  RTCB: 12/03/2020 Nonopioids transfer 01/31/2020: Baclofen and Lyrica  Pharmacotherapy Assessment   Analgesic: Oxycodone IR 5 mg, 1 tab PO BID (10 mg/day of oxycodone) MME/day: 15 mg/day.   Monitoring: [place] PMP: PDMP reviewed during this encounter.       Pharmacotherapy: No side-effects or adverse reactions reported. Compliance: No problems identified. Effectiveness: Clinically  acceptable.  Landis Martins, RN  08/28/2020  1:27 PM  Sign when Signing Visit Nursing Pain Medication Assessment:  Safety precautions to be maintained throughout the outpatient stay will include: orient to  surroundings, keep bed in low position, maintain call bell within reach at all times, provide assistance with transfer out of bed and ambulation.  Medication Inspection Compliance: Pill count conducted under aseptic conditions, in front of the patient. Neither the pills nor the bottle was removed from the patient's sight at any time. Once count was completed pills were immediately returned to the patient in their original bottle.  Medication: Oxycodone IR Pill/Patch Count: 17 of 903 pills remain Pill/Patch Appearance: Markings consistent with prescribed medication Bottle Appearance: Standard pharmacy container. Clearly labeled. Filled Date: 08/05/2020 Last Medication intake:  Today    UDS:  Summary  Date Value Ref Range Status  03/08/2020 Note  Final    Comment:    ==================================================================== [employer] [facility] (MW) ==================================================================== Test                             Result       Flag       Units  Drug Absent but Declared for Prescription Verification   Oxycodone                      Not Detected UNEXPECTED ng/mg creat ==================================================================== Test                      Result    Flag   Units      Ref Range   Creatinine              121              mg/dL      >=20 ==================================================================== Declared Medications:  The flagging and interpretation on this report are based on the  following declared medications.  Unexpected results may arise from  inaccuracies in the declared medications.   **Note: The testing scope of this panel includes these medications:   Oxycodone   **Note: The testing scope of this  panel does not include the  following reported medications:   Aspirin  Baclofen (Lioresal)  Lenalidomide (Revlimid)  Pregabalin (Lyrica)  Sertraline (Zoloft) ==================================================================== For clinical consultation, please call [phone]. ====================================================================      ROS  Constitutional: Denies any fever or chills Gastrointestinal: No reported hemesis, hematochezia, vomiting, or acute GI distress Musculoskeletal: Denies any acute onset joint swelling, redness, loss of ROM, or weakness Neurological: No reported episodes of acute onset apraxia, aphasia, dysarthria, agnosia, amnesia, paralysis, loss of coordination, or loss of consciousness  Medication Review  aspirin EC, baclofen, lenalidomide, oxyCODONE, pregabalin, and sertraline  History Review  Allergy: James House has No Known Allergies. Drug: James House  reports no history of drug use. Alcohol:  reports no history of alcohol use. Tobacco:  reports that he quit smoking about 7 years ago. His smokeless tobacco use includes snuff. Social: James House  reports that he quit smoking about 7 years ago. His smokeless tobacco use includes snuff. He reports that he does not drink alcohol and does not use drugs. Medical:  has a past medical history of CKD (chronic kidney disease) stage 3, GFR 30-59 ml/min ([place]), Hypertension, Multiple myeloma ([place]) (08/11/2017), Neuropathy, and Pneumonia. Surgical: James House  has a past surgical history that includes Joint replacement; Knee surgery (Left); and Abdominal surgery. Family: family history includes COPD in his maternal grandmother and mother; Cancer in his maternal uncle; Cancer (age of onset: 59) in his maternal grandmother; Cancer (age of onset: 94) in his mother; Diabetes  in his maternal grandmother and mother; Hyperlipidemia in his mother; Hypertension in his maternal grandmother and mother.  Laboratory  Chemistry Profile   Renal Lab Results  Component Value Date   BUN 17 07/28/2020   CREATININE 1.47 (H) 07/28/2020   BCR 7 (L) 11/26/2017   GFRAA >60 12/31/2019   GFRNONAA 58 (L) 07/28/2020     Hepatic Lab Results  Component Value Date   AST 27 07/28/2020   ALT 22 07/28/2020   ALBUMIN 4.4 07/28/2020   ALKPHOS 64 07/28/2020   LIPASE 37 05/23/2020     Electrolytes Lab Results  Component Value Date   NA 137 07/28/2020   K 3.5 07/28/2020   CL 102 07/28/2020   CALCIUM 8.8 (L) 07/28/2020   MG 2.4 (H) 11/26/2017     Bone Lab Results  Component Value Date   25OHVITD1 41 11/26/2017   25OHVITD2 4.4 11/26/2017   25OHVITD3 37 11/26/2017   TESTOSTERONE 570 12/29/2017     Inflammation (CRP: Acute Phase) (ESR: Chronic Phase) Lab Results  Component Value Date   CRP 9 11/26/2017   ESRSEDRATE 16 (H) 11/26/2017   LATICACIDVEN 1.1 08/11/2017       Note: Above Lab results reviewed.  Recent Imaging Review  DG Bone Survey Met CLINICAL DATA:  Multiple myeloma  EXAM: METASTATIC BONE SURVEY  COMPARISON:  October 22, 2018  FINDINGS: Skull: Multiple small lucencies throughout the calvarium appear stable, likely due to multiple myeloma. No progression of lytic change. Sella appears normal.  Cervical spine: No appreciable blastic or lytic bone lesions.  Thoracic spine: No blastic or lytic bone lesions. Slight anterior wedging at T6, see T7, and T8 again noted without progression.  Chest: Lungs are clear. Heart size and pulmonary vascular normal. No adenopathy. Old healed fracture lateral right seventh rib equivocal small lucency in the lateral right clavicle is stable. No new lytic appearing lesions. No blastic lesions.  Lumbar spine: No blastic or lytic bone lesions evident. No fracture or spondylolisthesis.  Pelvis: Equivocal lucencies in each femoral neck appear stable. No new or well-defined blastic or lytic bone lesions.  Right femur: Equivocal lucencies in the right  femoral neck stable. No new lucency to suggest progression of disease. No abnormal periosteal reaction.  Left femur: Equivocal lucency in the femoral neck region stable. No evidence suggesting progression of disease. No bony destruction. No abnormal periosteal reaction. Lateral left hemiarthroplasty noted, stable.  Right tibia and fibula: No blastic or lytic bone lesions. No abnormal periosteal reaction.  Left tibia and fibula: No blastic or lytic bone lesions. No abnormal periosteal reaction.  Right shoulder and humerus: Equivocal lucency in the proximal right humerus and lateral right clavicle stable. No progression of potential lytic change. No abnormal periosteal reaction. No blastic lesions.  Left shoulder and humerus: Subtle lytic lesions in the proximal left humerus stable. No progression of lytic change. No abnormal periosteal reaction. No evident blastic lesion.  Right forearm: No blastic or lytic bone lesions. No abnormal periosteal reaction.  Left forearm: No blastic or lytic bone lesions. No abnormal periosteal reaction.  IMPRESSION: In comparison with prior study, multiple small lucencies felt to represent changes of multiple myeloma in the skull appear stable. Somewhat subtle lesions in each proximal humerus and equivocal areas of lucency in each femoral neck and lateral clavicle are stable. No findings suggesting progression of multiple myeloma. No new or enlarging lesions evident.  Mild anterior wedging at T6, T7, and T8 is stable.  Lungs clear.  No thoracic adenopathy  on chest radiograph.  Electronically Signed   By: Lowella Grip III M.D.   On: 07/31/2020 09:19 Note: Reviewed        Physical Exam  General appearance: Well nourished, well developed, and well hydrated. In no apparent acute distress Mental status: Alert, oriented x 3 (person, place, & time)       Respiratory: No evidence of acute respiratory distress Eyes: PERLA Vitals: BP (!)  165/101   Pulse 91   Temp (!) 97.3 F (36.3 C) (Temporal)   Resp 16   Ht _0  (1.702 m)   Wt 155 lb (70.3 kg)   SpO2 100%   BMI 24.28 kg/m  BMI: Estimated body mass index is 24.28 kg/m as calculated from the following:   Height as of this encounter: _1  (1.702 m).   Weight as of this encounter: 155 lb (70.3 kg). Ideal: Ideal body weight: 66.1 kg (145 lb 11.6 oz) Adjusted ideal body weight: 67.8 kg (149 lb 7 oz)  Assessment   Status Diagnosis  Controlled Controlled Controlled 1. Chronic pain syndrome   2. Chronic low back pain (Bilateral) (L>R) w/ sciatica (Bilateral)   3. Chronic lower extremity pain (2ry area of Pain) (Bilateral) (L>R)   4. Chronic lumbar radiculopathy (by EMG/PNCV) (L5) (Right)   5. Chronic knee pain (3ry area of Pain) (Bilateral) (L>R)   6. Pharmacologic therapy   7. Chronic use of opiate for therapeutic purpose   8. Uncomplicated opioid dependence (HCC)   9. Epicondylitis elbow, medial (Left)   10. Epicondylitis elbow, medial (Right)      Updated Problems: Problem  Chronic elbow joint pain (Medial aspect) (Bilateral)  Chronic low back pain (1ry area of Pain) (Bilateral) (L>R) w/o sciatica  Chronic low back pain (Bilateral) (L>R) w/ sciatica (Bilateral)  Chronic lower extremity pain (2ry area of Pain) (Bilateral) (L>R)  Chronic knee pain (3ry area of Pain) (Bilateral) (L>R)  Chronic Use of Opiate for Therapeutic Purpose    Plan of Care  Problem-specific:  No problem-specific Assessment & Plan notes found for this encounter.  James House. has a current medication list which includes the following long-term medication(s): baclofen, [START ON 09/04/2020] oxycodone, [START ON 10/04/2020] oxycodone, [START ON 11/03/2020] oxycodone, pregabalin, and sertraline.  Pharmacotherapy (Medications Ordered): Meds ordered this encounter  Medications  . oxyCODONE (OXY IR/ROXICODONE) 5 MG immediate release tablet    Sig: Take 1 tablet (5 mg total) by  mouth every 8 (eight) hours as needed for severe pain. Must last 30 days    Dispense:  90 tablet    Refill:  0    Not a duplicate. Do NOT delete! Dispense 1 day early if closed on refill date. Avoid benzodiazepines within 8 hours of opioids. Do not send refill requests.  Marland Kitchen oxyCODONE (OXY IR/ROXICODONE) 5 MG immediate release tablet    Sig: Take 1 tablet (5 mg total) by mouth every 8 (eight) hours as needed for severe pain. Must last 30 days    Dispense:  90 tablet    Refill:  0    Not a duplicate. Do NOT delete! Dispense 1 day early if closed on refill date. Avoid benzodiazepines within 8 hours of opioids. Do not send refill requests.  Marland Kitchen oxyCODONE (OXY IR/ROXICODONE) 5 MG immediate release tablet    Sig: Take 1 tablet (5 mg total) by mouth every 8 (eight) hours as needed for severe pain. Must last 30 days    Dispense:  90 tablet  Refill:  0    Not a duplicate. Do NOT delete! Dispense 1 day early if closed on refill date. Avoid benzodiazepines within 8 hours of opioids. Do not send refill requests.   Orders:  Orders Placed This Encounter  Procedures  . Medium Joint Injection/Arthrocentesis    Level(s): Medial Epicondyle Laterality: Bilateral Sedation: Patient's choice Purpose: Diagnostic Indication(s): Sub-acute pain    Standing Status:   Future    Standing Expiration Date:   11/28/2020    Scheduling Instructions:     Requested Scheduling Timeframe: ASAP   Follow-up plan:   Return for Procedure (no sedation): (B) Medial Epicondyle inj ([place]).      Interventional management options:  Considering:   DiagnosticbilateralLESI Possible bilateral lumbar facet RFA Diagnostic bilateral sacroiliac joint block Possible bilateral sacroiliac joint RFA Diagnostic right knee Hyalgan series Diagnosticright knee intra-articular knee injection Diagnostic left knee genicular nerve block   Palliative PRN treatment(s):   Palliative bilateral IA hip joint injection #2  (50/50/50/50) Palliative bilateral lumbar facet block #3 (100/100/0) (60/60/60) Palliative left piriformis muscle injection #2 (100/100/0)       Recent Visits Date Type Provider Dept  06/05/20 Office Visit Milinda Pointer, MD [facility]  Showing recent visits within past 90 days and meeting all other requirements Today's Visits Date Type Provider Dept  08/28/20 Office Visit Milinda Pointer, MD [facility]  Showing today's visits and meeting all other requirements Future Appointments No visits were found meeting these conditions. Showing future appointments within next 90 days and meeting all other requirements  I discussed the assessment and treatment plan with the patient. The patient was provided an opportunity to ask questions and all were answered. The patient agreed with the plan and demonstrated an understanding of the instructions.  Patient advised to call back or seek an in-person evaluation if the symptoms or condition worsens.  Duration of encounter: 30 minutes.  Note by: Gaspar Cola, MD Date: 08/28/2020; Time: 1:53 PM

## 2020-08-28 ENCOUNTER — Other Ambulatory Visit (HOSPITAL_COMMUNITY): Payer: Self-pay

## 2020-08-28 ENCOUNTER — Encounter: Payer: Self-pay | Admitting: Pain Medicine

## 2020-08-28 ENCOUNTER — Other Ambulatory Visit: Payer: Self-pay

## 2020-08-28 ENCOUNTER — Ambulatory Visit: Payer: No Typology Code available for payment source | Attending: Pain Medicine | Admitting: Pain Medicine

## 2020-08-28 VITALS — BP 165/101 | HR 91 | Temp 97.3°F | Resp 16 | Ht 67.0 in | Wt 155.0 lb

## 2020-08-28 DIAGNOSIS — M7702 Medial epicondylitis, left elbow: Secondary | ICD-10-CM | POA: Insufficient documentation

## 2020-08-28 DIAGNOSIS — M79605 Pain in left leg: Secondary | ICD-10-CM | POA: Insufficient documentation

## 2020-08-28 DIAGNOSIS — M25561 Pain in right knee: Secondary | ICD-10-CM | POA: Diagnosis present

## 2020-08-28 DIAGNOSIS — M25562 Pain in left knee: Secondary | ICD-10-CM | POA: Diagnosis present

## 2020-08-28 DIAGNOSIS — G894 Chronic pain syndrome: Secondary | ICD-10-CM | POA: Insufficient documentation

## 2020-08-28 DIAGNOSIS — M7701 Medial epicondylitis, right elbow: Secondary | ICD-10-CM | POA: Diagnosis present

## 2020-08-28 DIAGNOSIS — G8929 Other chronic pain: Secondary | ICD-10-CM | POA: Insufficient documentation

## 2020-08-28 DIAGNOSIS — M5416 Radiculopathy, lumbar region: Secondary | ICD-10-CM | POA: Insufficient documentation

## 2020-08-28 DIAGNOSIS — M5441 Lumbago with sciatica, right side: Secondary | ICD-10-CM | POA: Insufficient documentation

## 2020-08-28 DIAGNOSIS — F1120 Opioid dependence, uncomplicated: Secondary | ICD-10-CM | POA: Insufficient documentation

## 2020-08-28 DIAGNOSIS — M5442 Lumbago with sciatica, left side: Secondary | ICD-10-CM | POA: Diagnosis not present

## 2020-08-28 DIAGNOSIS — Z79891 Long term (current) use of opiate analgesic: Secondary | ICD-10-CM | POA: Insufficient documentation

## 2020-08-28 DIAGNOSIS — Z79899 Other long term (current) drug therapy: Secondary | ICD-10-CM | POA: Diagnosis present

## 2020-08-28 DIAGNOSIS — M79604 Pain in right leg: Secondary | ICD-10-CM | POA: Insufficient documentation

## 2020-08-28 MED ORDER — OXYCODONE HCL 5 MG PO TABS
5.0000 mg | ORAL_TABLET | Freq: Three times a day (TID) | ORAL | 0 refills | Status: DC | PRN
Start: 2020-11-03 — End: 2020-12-04
  Filled 2020-11-03: qty 90, 30d supply, fill #0

## 2020-08-28 MED ORDER — OXYCODONE HCL 5 MG PO TABS
5.0000 mg | ORAL_TABLET | Freq: Three times a day (TID) | ORAL | 0 refills | Status: DC | PRN
  Filled 2020-10-04: qty 90, 30d supply, fill #0

## 2020-08-28 MED ORDER — OXYCODONE HCL 5 MG PO TABS
5.0000 mg | ORAL_TABLET | Freq: Three times a day (TID) | ORAL | 0 refills | Status: DC | PRN
  Filled 2020-09-04: qty 90, 30d supply, fill #0

## 2020-08-28 NOTE — Patient Instructions (Addendum)
____________________________________________________________________________________________  Preparing for your procedure (without sedation)  Procedure appointments are limited to planned procedures: . No Prescription Refills. . No disability issues will be discussed. . No medication changes will be discussed.  Instructions: . Oral Intake: Do not eat or drink anything for at least 6 hours prior to your procedure. (Exception: Blood Pressure Medication. See below.) . Transportation: Unless otherwise stated by your physician, you may drive yourself after the procedure. . Blood Pressure Medicine: Do not forget to take your blood pressure medicine with a sip of water the morning of the procedure. If your Diastolic (lower reading)is above 100 mmHg, elective cases will be cancelled/rescheduled. . Blood thinners: These will need to be stopped for procedures. Notify our staff if you are taking any blood thinners. Depending on which one you take, there will be specific instructions on how and when to stop it. . Diabetics on insulin: Notify the staff so that you can be scheduled 1st case in the morning. If your diabetes requires high dose insulin, take only  of your normal insulin dose the morning of the procedure and notify the staff that you have done so. . Preventing infections: Shower with an antibacterial soap the morning of your procedure.  . Build-up your immune system: Take 1000 mg of Vitamin C with every meal (3 times a day) the day prior to your procedure. . Antibiotics: Inform the staff if you have a condition or reason that requires you to take antibiotics before dental procedures. . Pregnancy: If you are pregnant, call and cancel the procedure. . Sickness: If you have a cold, fever, or any active infections, call and cancel the procedure. . Arrival: You must be in the facility at least 30 minutes prior to your scheduled procedure. . Children: Do not bring any children with you. . Dress  appropriately: Bring dark clothing that you would not mind if they get stained. . Valuables: Do not bring any jewelry or valuables.  Reasons to call and reschedule or cancel your procedure: (Following these recommendations will minimize the risk of a serious complication.) . Surgeries: Avoid having procedures within 2 weeks of any surgery. (Avoid for 2 weeks before or after any surgery). . Flu Shots: Avoid having procedures within 2 weeks of a flu shots or . (Avoid for 2 weeks before or after immunizations). . Barium: Avoid having a procedure within 7-10 days after having had a radiological study involving the use of radiological contrast. (Myelograms, Barium swallow or enema study). . Heart attacks: Avoid any elective procedures or surgeries for the initial 6 months after a "Myocardial Infarction" (Heart Attack). . Blood thinners: It is imperative that you stop these medications before procedures. Let us know if you if you take any blood thinner.  . Infection: Avoid procedures during or within two weeks of an infection (including chest colds or gastrointestinal problems). Symptoms associated with infections include: Localized redness, fever, chills, night sweats or profuse sweating, burning sensation when voiding, cough, congestion, stuffiness, runny nose, sore throat, diarrhea, nausea, vomiting, cold or Flu symptoms, recent or current infections. It is specially important if the infection is over the area that we intend to treat. . Heart and lung problems: Symptoms that may suggest an active cardiopulmonary problem include: cough, chest pain, breathing difficulties or shortness of breath, dizziness, ankle swelling, uncontrolled high or unusually low blood pressure, and/or palpitations. If you are experiencing any of these symptoms, cancel your procedure and contact your primary care physician for an evaluation.  Remember:  Regular   Business hours are:  Monday to Thursday 8:00 AM to 4:00  PM  Provider's Schedule: Milinda Pointer, MD:  Procedure days: Tuesday and Thursday 7:30 AM to 4:00 PM  Gillis Santa, MD:  Procedure days: Monday and Wednesday 7:30 AM to 4:00 PM ____________________________________________________________________________________________   ____________________________________________________________________________________________  General Risks and Possible Complications  Patient Responsibilities: It is important that you read this as it is part of your informed consent. It is our duty to inform you of the risks and possible complications associated with treatments offered to you. It is your responsibility as a patient to read this and to ask questions about anything that is not clear or that you believe was not covered in this document.  Patient's Rights: You have the right to refuse treatment. You also have the right to change your mind, even after initially having agreed to have the treatment done. However, under this last option, if you wait until the last second to change your mind, you may be charged for the materials used up to that point.  Introduction: Medicine is not an Chief Strategy Officer. Everything in Medicine, including the lack of treatment(s), carries the potential for danger, harm, or loss (which is by definition: Risk). In Medicine, a complication is a secondary problem, condition, or disease that can aggravate an already existing one. All treatments carry the risk of possible complications. The fact that a side effects or complications occurs, does not imply that the treatment was conducted incorrectly. It must be clearly understood that these can happen even when everything is done following the highest safety standards.  No treatment: You can choose not to proceed with the proposed treatment alternative. The "PRO(s)" would include: avoiding the risk of complications associated with the therapy. The "CON(s)" would include: not getting any of the  treatment benefits. These benefits fall under one of three categories: diagnostic; therapeutic; and/or palliative. Diagnostic benefits include: getting information which can ultimately lead to improvement of the disease or symptom(s). Therapeutic benefits are those associated with the successful treatment of the disease. Finally, palliative benefits are those related to the decrease of the primary symptoms, without necessarily curing the condition (example: decreasing the pain from a flare-up of a chronic condition, such as incurable terminal cancer).  General Risks and Complications: These are associated to most interventional treatments. They can occur alone, or in combination. They fall under one of the following six (6) categories: no benefit or worsening of symptoms; bleeding; infection; nerve damage; allergic reactions; and/or death. 1. No benefits or worsening of symptoms: In Medicine there are no guarantees, only probabilities. No healthcare provider can ever guarantee that a medical treatment will work, they can only state the probability that it may. Furthermore, there is always the possibility that the condition may worsen, either directly, or indirectly, as a consequence of the treatment. 2. Bleeding: This is more common if the patient is taking a blood thinner, either prescription or over the counter (example: Goody Powders, Fish oil, Aspirin, Garlic, etc.), or if suffering a condition associated with impaired coagulation (example: Hemophilia, cirrhosis of the liver, low platelet counts, etc.). However, even if you do not have one on these, it can still happen. If you have any of these conditions, or take one of these drugs, make sure to notify your treating physician. 3. Infection: This is more common in patients with a compromised immune system, either due to disease (example: diabetes, cancer, human immunodeficiency virus [HIV], etc.), or due to medications or treatments (example: therapies used  to treat cancer and rheumatological diseases). However, even if you do not have one on these, it can still happen. If you have any of these conditions, or take one of these drugs, make sure to notify your treating physician. 4. Nerve Damage: This is more common when the treatment is an invasive one, but it can also happen with the use of medications, such as those used in the treatment of cancer. The damage can occur to small secondary nerves, or to large primary ones, such as those in the spinal cord and brain. This damage may be temporary or permanent and it may lead to impairments that can range from temporary numbness to permanent paralysis and/or brain death. 5. Allergic Reactions: Any time a substance or material comes in contact with our body, there is the possibility of an allergic reaction. These can range from a mild skin rash (contact dermatitis) to a severe systemic reaction (anaphylactic reaction), which can result in death. 6. Death: In general, any medical intervention can result in death, most of the time due to an unforeseen complication. ____________________________________________________________________________________________  Preparing for your procedure (without sedation) Instructions: . Oral Intake: Do not eat or drink anything for at least 3 hours prior to your procedure. . Transportation: Unless otherwise stated by your physician, you may drive yourself after the procedure. . Blood Pressure Medicine: Take your blood pressure medicine with a sip of water the morning of the procedure. . Insulin: Take only  of your normal insulin dose. . Preventing infections: Shower with an antibacterial soap the morning of your procedure. . Build-up your immune system: Take 1000 mg of Vitamin C with every meal (3 times a day) the day prior to your procedure. . Pregnancy: If you are pregnant, call and cancel the procedure. . Sickness: If you have a cold, fever, or any active infections, call  and cancel the procedure. . Arrival: You must be in the facility at least 30 minutes prior to your scheduled procedure. . Children: Do not bring any children with you. . Dress appropriately: Bring dark clothing that you would not mind if they get stained. . Valuables: Do not bring any jewelry or valuables. Procedure appointments are reserved for interventional treatments only. Marland Kitchen No Prescription Refills. . No medication changes will be discussed during procedure appointments. . No disability issues will be discussed. Marland Kitchen

## 2020-08-28 NOTE — Progress Notes (Signed)
Nursing Pain Medication Assessment:  Safety precautions to be maintained throughout the outpatient stay will include: orient to surroundings, keep bed in low position, maintain call bell within reach at all times, provide assistance with transfer out of bed and ambulation.  Medication Inspection Compliance: Pill count conducted under aseptic conditions, in front of the patient. Neither the pills nor the bottle was removed from the patient's sight at any time. Once count was completed pills were immediately returned to the patient in their original bottle.  Medication: Oxycodone IR Pill/Patch Count: 17 of 903 pills remain Pill/Patch Appearance: Markings consistent with prescribed medication Bottle Appearance: Standard pharmacy container. Clearly labeled. Filled Date: 08/05/2020 Last Medication intake:  Today

## 2020-08-29 ENCOUNTER — Ambulatory Visit: Payer: No Typology Code available for payment source | Attending: Pain Medicine | Admitting: Pain Medicine

## 2020-08-29 ENCOUNTER — Telehealth: Payer: Self-pay

## 2020-08-29 ENCOUNTER — Other Ambulatory Visit: Payer: Self-pay

## 2020-08-29 VITALS — BP 142/102 | HR 79 | Temp 97.2°F | Resp 16 | Ht 67.0 in | Wt 155.0 lb

## 2020-08-29 DIAGNOSIS — M7701 Medial epicondylitis, right elbow: Secondary | ICD-10-CM | POA: Insufficient documentation

## 2020-08-29 DIAGNOSIS — M7702 Medial epicondylitis, left elbow: Secondary | ICD-10-CM | POA: Diagnosis present

## 2020-08-29 DIAGNOSIS — M25522 Pain in left elbow: Secondary | ICD-10-CM | POA: Insufficient documentation

## 2020-08-29 DIAGNOSIS — M25521 Pain in right elbow: Secondary | ICD-10-CM | POA: Insufficient documentation

## 2020-08-29 DIAGNOSIS — G894 Chronic pain syndrome: Secondary | ICD-10-CM

## 2020-08-29 MED ORDER — LIDOCAINE HCL (PF) 2 % IJ SOLN
INTRAMUSCULAR | Status: AC
  Filled 2020-08-29: qty 5

## 2020-08-29 MED ORDER — METHYLPREDNISOLONE ACETATE 80 MG/ML IJ SUSP
INTRAMUSCULAR | Status: AC
  Filled 2020-08-29: qty 1

## 2020-08-29 MED ORDER — LIDOCAINE HCL 2 % IJ SOLN
INTRAMUSCULAR | Status: AC
  Filled 2020-08-29: qty 20

## 2020-08-29 MED ORDER — ROPIVACAINE HCL 2 MG/ML IJ SOLN
INTRAMUSCULAR | Status: AC
  Filled 2020-08-29: qty 10

## 2020-08-29 MED ORDER — METHYLPREDNISOLONE ACETATE 80 MG/ML IJ SUSP
80.0000 mg | Freq: Once | INTRAMUSCULAR | Status: AC
  Administered 2020-08-29: 80 mg

## 2020-08-29 MED ORDER — LIDOCAINE HCL 2 % IJ SOLN
20.0000 mL | Freq: Once | INTRAMUSCULAR | Status: AC
Start: 2020-08-29 — End: 2020-08-29
  Administered 2020-08-29: 100 mg

## 2020-08-29 MED ORDER — ROPIVACAINE HCL 2 MG/ML IJ SOLN
9.0000 mL | Freq: Once | INTRAMUSCULAR | Status: AC
  Administered 2020-08-29: 10 mL

## 2020-08-29 MED ORDER — LIDOCAINE HCL (PF) 2 % IJ SOLN
INTRAMUSCULAR | Status: AC
  Filled 2020-08-29: qty 10

## 2020-08-29 NOTE — Progress Notes (Signed)
Safety precautions to be maintained throughout the outpatient stay will include: orient to surroundings, keep bed in low position, maintain call bell within reach at all times, provide assistance with transfer out of bed and ambulation.  

## 2020-08-29 NOTE — Telephone Encounter (Addendum)
Can we get some additional information?  Is he referring to his pain management doctor?  Okay to place referral if this is the case.

## 2020-08-29 NOTE — Progress Notes (Signed)
PROVIDER NOTE: Information contained herein reflects review and annotations entered in association with encounter. Interpretation of such information and data should be left to medically-trained personnel. Information provided to patient can be located elsewhere in the medical record under "Patient Instructions". Document created using STT-dictation technology, any transcriptional errors that may result from process are unintentional.    Patient: James House.  Service Category: Procedure  Provider: Gaspar Cola, MD  DOB: February 10, 1971  DOS: 08/29/2020  Location: [facility]  MRN: [id number]  Setting: Ambulatory - outpatient  Referring Provider: Pleas Koch, NP  Type: Established Patient  Specialty: Interventional Pain Management  PCP: Pleas Koch, NP   Primary Reason for Visit: Interventional Pain Management Treatment. CC: Elbow Pain (bilateral)  Procedure:          Anesthesia, Analgesia, Anxiolysis:  Type: Therapeutic Medial Epicondyle Steroid Injection  #1  Region: Medial Elbow Area Level: Elbow Laterality: Bilateral  Type: Local Anesthesia Indication(s): Analgesia         Local Anesthetic: Lidocaine 1-2% Route: Infiltration ([place]/IM) IV Access: Declined Sedation: Declined   Position: Sitting   Indications: 1. Chronic elbow joint pain (Medial aspect) (Bilateral)   2. Epicondylitis elbow, medial (Left)   3. Epicondylitis elbow, medial (Right)    Pain Score: Pre-procedure: 8 /10 Post-procedure: 8 /10   Pre-op H&P Assessment:  James House is a 50 y.o. (year old), male patient, seen today for interventional treatment. He  has a past surgical history that includes Joint replacement; Knee surgery (Left); and Abdominal surgery. James House has a current medication list which includes the following prescription(s): aspirin ec, baclofen, lenalidomide, [START ON 09/04/2020] oxycodone, [START ON 10/04/2020] oxycodone, [START ON 11/03/2020] oxycodone, pregabalin, and  sertraline. His primarily concern today is the Elbow Pain (bilateral)  Initial Vital Signs:  Pulse/HCG Rate: 79  Temp: (!) 97.2 F (36.2 C) Resp: 16 BP: (!) 142/102 SpO2: 98 %  BMI: Estimated body mass index is 24.28 kg/m as calculated from the following:   Height as of this encounter: 5\' 7"  (1.702 m).   Weight as of this encounter: 155 lb (70.3 kg).  Risk Assessment: Allergies: Reviewed. He has No Known Allergies.  Allergy Precautions: None required Coagulopathies: Reviewed. None identified.  Blood-thinner therapy: None at this time Active Infection(s): Reviewed. None identified. James House is afebrile  Site Confirmation: James House was asked to confirm the procedure and laterality before marking the site Procedure checklist: Completed Consent: Before the procedure and under the influence of no sedative(s), amnesic(s), or anxiolytics, the patient was informed of the treatment options, risks and possible complications. To fulfill our ethical and legal obligations, as recommended by the American Medical Association's Code of Ethics, I have informed the patient of my clinical impression; the nature and purpose of the treatment or procedure; the risks, benefits, and possible complications of the intervention; the alternatives, including doing nothing; the risk(s) and benefit(s) of the alternative treatment(s) or procedure(s); and the risk(s) and benefit(s) of doing nothing. The patient was provided information about the general risks and possible complications associated with the procedure. These may include, but are not limited to: failure to achieve desired goals, infection, bleeding, organ or nerve damage, allergic reactions, paralysis, and death. In addition, the patient was informed of those risks and complications associated to the procedure, such as failure to decrease pain; infection; bleeding; organ or nerve damage with subsequent damage to sensory, motor, and/or autonomic systems,  resulting in permanent pain, numbness, and/or weakness of one  or several areas of the body; allergic reactions; (i.e.: anaphylactic reaction); and/or death. Furthermore, the patient was informed of those risks and complications associated with the medications. These include, but are not limited to: allergic reactions (i.e.: anaphylactic or anaphylactoid reaction(s)); adrenal axis suppression; blood sugar elevation that in diabetics may result in ketoacidosis or comma; water retention that in patients with history of congestive heart failure may result in shortness of breath, pulmonary edema, and decompensation with resultant heart failure; weight gain; swelling or edema; medication-induced neural toxicity; particulate matter embolism and blood vessel occlusion with resultant organ, and/or nervous system infarction; and/or aseptic necrosis of one or more joints. Finally, the patient was informed that Medicine is not an exact science; therefore, there is also the possibility of unforeseen or unpredictable risks and/or possible complications that may result in a catastrophic outcome. The patient indicated having understood very clearly. We have given the patient no guarantees and we have made no promises. Enough time was given to the patient to ask questions, all of which were answered to the patient's satisfaction. James House has indicated that he wanted to continue with the procedure. Attestation: I, the ordering provider, attest that I have discussed with the patient the benefits, risks, side-effects, alternatives, likelihood of achieving goals, and potential problems during recovery for the procedure that I have provided informed consent. Date  Time: 08/29/2020 10:50 AM  Pre-Procedure Preparation:  Monitoring: As per clinic protocol. Respiration, ETCO2, SpO2, BP, heart rate and rhythm monitor placed and checked for adequate function Safety Precautions: Patient was assessed for positional comfort and pressure  points before starting the procedure. Time-out: I initiated and conducted the "Time-out" before starting the procedure, as per protocol. The patient was asked to participate by confirming the accuracy of the "Time Out" information. Verification of the correct person, site, and procedure were performed and confirmed by me, the nursing staff, and the patient. "Time-out" conducted as per Joint Commission's Universal Protocol (UP.01.01.01). Time: 1135  Description of Procedure:          Target Area: Medial epicondyle Approach: Medial approach. Area Prepped: Entire elbow Region DuraPrep (Iodine Povacrylex [0.7% available iodine] and Isopropyl Alcohol, 74% w/w) Safety Precautions: Aspiration looking for blood return was conducted prior to all injections. At no point did we inject any substances, as a needle was being advanced. No attempts were made at seeking any paresthesias. Safe injection practices and needle disposal techniques used. Medications properly checked for expiration dates. SDV (single dose vial) medications used. Description of the Procedure: Protocol guidelines were followed. The patient was placed in position. The target area was identified and the area prepped in the usual manner. Skin & deeper tissues infiltrated with local anesthetic. Appropriate amount of time allowed to pass for local anesthetics to take effect. The procedure needles were then advanced to the target area. Proper needle placement secured. Negative aspiration confirmed. Solution injected in intermittent fashion, asking for systemic symptoms every 0.5cc of injectate. The needles were then removed and the area cleansed, making sure to leave some of the prepping solution back to take advantage of its long term bactericidal properties. Vitals:   08/29/20 1049  BP: (!) 142/102  Pulse: 79  Resp: 16  Temp: (!) 97.2 F (36.2 C)  TempSrc: Temporal  SpO2: 98%  Weight: 155 lb (70.3 kg)  Height: 5\' 7"  (1.702 m)    Start  Time: 1135 hrs. End Time: 1136 hrs. Materials:  Needle(s) Type: Spinal Needle Gauge: 22G Length: 3.5-in Medication(s): Please see orders  for medications and dosing details.                 Imaging Guidance:          Type of Imaging Technique: None used Indication(s): N/A Exposure Time: No patient exposure Contrast: None used. Fluoroscopic Guidance: N/A Ultrasound Guidance: N/A Interpretation: N/A  Antibiotic Prophylaxis:   Anti-infectives (From admission, onward)   None     Indication(s): None identified  Post-operative Assessment:  Post-procedure Vital Signs:  Pulse/HCG Rate: 79  Temp: (!) 97.2 F (36.2 C) Resp: 16 BP: (!) 142/102 SpO2: 98 %  EBL: None  Complications: No immediate post-treatment complications observed by team, or reported by patient.  Note: The patient tolerated the entire procedure well. A repeat set of vitals were taken after the procedure and the patient was kept under observation following institutional policy, for this type of procedure. Post-procedural neurological assessment was performed, showing return to baseline, prior to discharge. The patient was provided with post-procedure discharge instructions, including a section on how to identify potential problems. Should any problems arise concerning this procedure, the patient was given instructions to immediately contact us, at any time, without hesitation. In any case, we plan to contact the patient by telephone for a follow-up status report regarding this interventional procedure.  Comments:  No additional relevant information.  Plan of Care  Orders:  Orders Placed This Encounter  Procedures  . Medium Joint Injection/Arthrocentesis    Level(s): Bilateral medial epicondyle Laterality: Bilateral Sedation: No Sedation Purpose: Therapeutic Indication(s): Chronic elbow pain    Scheduling Instructions:     Requested Scheduling Timeframe: Today  . Informed Consent Details:  Physician/Practitioner Attestation; Transcribe to consent form and obtain patient signature    Nursing Order: Transcribe to consent form and obtain patient signature. Note: Always confirm laterality of pain with Mr. Wiklund, before procedure.    Order Specific Question:   Physician/Practitioner attestation of informed consent for procedure/surgical case    Answer:   I, the physician/practitioner, attest that I have discussed with the patient the benefits, risks, side effects, alternatives, likelihood of achieving goals and potential problems during recovery for the procedure that I have provided informed consent.    Order Specific Question:   Procedure    Answer:   Bilateral medial epicondyles elbow injection    Order Specific Question:   Physician/Practitioner performing the procedure    Answer:   Tajai Suder A. Dossie Arbour, MD    Order Specific Question:   Indication/Reason    Answer:   Bilateral medial epicondylitis  . Provide equipment / supplies at bedside    "Block Tray" (Disposable  single use) Needle type: SpinalRegular Amount/quantity: 1 Size: Short(1.5-inch) Gauge: 25G    Standing Status:   Standing    Number of Occurrences:   1    Order Specific Question:   Specify    Answer:   Block Tray   Chronic Opioid Analgesic:  Oxycodone IR 5 mg, 1 tab PO BID (10 mg/day of oxycodone) MME/day: 15 mg/day.   Medications ordered for procedure: Meds ordered this encounter  Medications  . lidocaine (XYLOCAINE) 2 % (with pres) injection 400 mg  . methylPREDNISolone acetate (DEPO-MEDROL) injection 80 mg  . ropivacaine (PF) 2 mg/mL (0.2%) (NAROPIN) injection 9 mL   Medications administered: We administered lidocaine, methylPREDNISolone acetate, and ropivacaine (PF) 2 mg/mL (0.2%).  See the medical record for exact dosing, route, and time of administration.  Follow-up plan:   Return in about 2 weeks (around 09/12/2020) for procedure day, (  afternoon VV), (PPE).       Interventional management  options:  Considering:   DiagnosticbilateralLESI Possible bilateral lumbar facet RFA Diagnostic bilateral sacroiliac joint block Possible bilateral sacroiliac joint RFA Diagnostic right knee Hyalgan series Diagnosticright knee intra-articular knee injection Diagnostic left knee genicular nerve block   Palliative PRN treatment(s):   Palliative bilateral IA hip joint injection #2 (50/50/50/50) Palliative bilateral lumbar facet block #3 (100/100/0) (60/60/60) Palliative left piriformis muscle injection #2 (100/100/0)        Recent Visits Date Type Provider Dept  08/28/20 Office Visit Milinda Pointer, MD [facility]  06/05/20 Office Visit Milinda Pointer, MD [facility]  Showing recent visits within past 90 days and meeting all other requirements Today's Visits Date Type Provider Dept  08/29/20 Procedure visit Milinda Pointer, MD [facility]  Showing today's visits and meeting all other requirements Future Appointments Date Type Provider Dept  09/19/20 Appointment Milinda Pointer, MD [facility]  Showing future appointments within next 90 days and meeting all other requirements  Disposition: Discharge home  Discharge (Date  Time): 08/29/2020; 1140 hrs.   Primary Care Physician: Pleas Koch, NP Location: [facility] Note by: Gaspar Cola, MD Date: 08/29/2020; Time: 12:36 PM  Disclaimer:  Medicine is not an exact science. The only guarantee in medicine is that nothing is guaranteed. It is important to note that the decision to proceed with this intervention was based on the information collected from the patient. The Data and conclusions were drawn from the patient's questionnaire, the interview, and the physical examination. Because the information was provided in large part by the patient, it cannot be guaranteed that it has not been purposely or unconsciously manipulated. Every  effort has been made to obtain as much relevant data as possible for this evaluation. It is important to note that the conclusions that lead to this procedure are derived in large part from the available data. Always take into account that the treatment will also be dependent on availability of resources and existing treatment guidelines, considered by other Pain Management Practitioners as being common knowledge and practice, at the time of the intervention. For Medico-Legal purposes, it is also important to point out that variation in procedural techniques and pharmacological choices are the acceptable norm. The indications, contraindications, technique, and results of the above procedure should only be interpreted and judged by a Board-Certified Interventional Pain Specialist with extensive familiarity and expertise in the same exact procedure and technique.

## 2020-08-29 NOTE — Patient Instructions (Signed)

## 2020-08-29 NOTE — Telephone Encounter (Signed)
Pt reports he is getting steroid inj in elbow scheduled for today and insurance states they require a referral from PCP for this.Marland KitchenMarland Kitchen

## 2020-08-30 ENCOUNTER — Telehealth: Payer: Self-pay

## 2020-08-30 NOTE — Telephone Encounter (Signed)
Noted, referral placed.  

## 2020-08-30 NOTE — Telephone Encounter (Signed)
Post procedure phone call. Patient states he is doing well.  

## 2020-08-30 NOTE — Addendum Note (Signed)
Addended by: Pleas Koch on: 08/30/2020 04:25 PM   Modules accepted: Orders

## 2020-08-30 NOTE — Telephone Encounter (Signed)
Called patient states that he needs to have referral for Pain clinic he has been seen there but due to insurance change just needs new referral

## 2020-09-01 ENCOUNTER — Other Ambulatory Visit (HOSPITAL_COMMUNITY): Payer: Self-pay

## 2020-09-01 ENCOUNTER — Telehealth: Payer: Self-pay | Admitting: Pharmacist

## 2020-09-01 NOTE — Telephone Encounter (Signed)
Oral Chemotherapy Pharmacist Encounter   Received a message from [facility] starting that the [employer] [facility] number for Mr. Balestrieri was flagged. Called [employer] [facility] to have the flag removed. Called [facility] to let them know the flag was removed. They will now continue to process the prescription.   Darl Pikes, PharmD, BCPS, BCOP, CPP Hematology/Oncology Clinical Pharmacist [facility] [phone]  09/01/2020 10:05 AM

## 2020-09-04 ENCOUNTER — Other Ambulatory Visit (HOSPITAL_COMMUNITY): Payer: Self-pay

## 2020-09-12 ENCOUNTER — Other Ambulatory Visit: Payer: Self-pay | Admitting: *Deleted

## 2020-09-12 DIAGNOSIS — C9001 Multiple myeloma in remission: Secondary | ICD-10-CM

## 2020-09-12 MED ORDER — LENALIDOMIDE 5 MG PO CAPS: 5.0000 mg | ORAL_CAPSULE | Freq: Every day | ORAL | 0 refills | Status: DC

## 2020-09-18 NOTE — Progress Notes (Signed)
Patient: James House.  Service Category: E/M  Provider: Gaspar Cola, MD  DOB: 04-Mar-1971  DOS: 09/19/2020  Location: Office  MRN: [id number]  Setting: Ambulatory outpatient  Referring Provider: Pleas Koch, NP  Type: Established Patient  Specialty: Interventional Pain Management  PCP: Pleas Koch, NP  Location: Remote location  Delivery: TeleHealth     Virtual Encounter - Pain Management PROVIDER NOTE: Information contained herein reflects review and annotations entered in association with encounter. Interpretation of such information and data should be left to medically-trained personnel. Information provided to patient can be located elsewhere in the medical record under "Patient Instructions". Document created using STT-dictation technology, any transcriptional errors that may result from process are unintentional.    Contact & Pharmacy Preferred: [phone] Home: [phone] (home) Mobile: [phone] (mobile) E-mail: [email or web address]  [place] [address] Phone: [phone] Fax: [phone]  [facility] - [place], [place] [address] Phone: [phone] Fax: [phone]  [facility] [place], [place] - [address] [place] [address] Phone: [phone] Fax: [phone]   Pre-screening  James House offered "in-person" vs "virtual" encounter. He indicated preferring virtual for this encounter.   Reason COVID-19*  Social distancing based on CDC and AMA recommendations.   I contacted James House. on 09/19/2020 via telephone.      I clearly identified myself as Gaspar Cola, MD. I verified that I was speaking with the correct person using two identifiers (Name: James House., and date of birth: [birth year]).  Consent I sought verbal advanced consent from James House. for virtual visit  interactions. I informed James House of possible security and privacy concerns, risks, and limitations associated with providing "not-in-person" medical evaluation and management services. I also informed James House of the availability of "in-person" appointments. Finally, I informed him that there would be a charge for the virtual visit and that he could be  personally, fully or partially, financially responsible for it. James House expressed understanding and agreed to proceed.   Historic Elements   Mr. James House. is a 50 y.o. year old, male patient evaluated today after our last contact on 08/29/2020. James House  has a past medical history of CKD (chronic kidney disease) stage 3, GFR 30-59 ml/min ([place]), Hypertension, Multiple myeloma ([place]) (08/11/2017), Neuropathy, and Pneumonia. He also  has a past surgical history that includes Joint replacement; Knee surgery (Left); and Abdominal surgery. James House has a current medication list which includes the following prescription(s): aspirin ec, baclofen, lenalidomide, oxycodone, [START ON 10/04/2020] oxycodone, [START ON 11/03/2020] oxycodone, pregabalin, and sertraline. He  reports that he quit smoking about 7 years ago. His smokeless tobacco use includes snuff. He reports that he does not drink alcohol and does not use drugs. James House has No Known Allergies.   HPI  Today, he is being contacted for a post-procedure assessment.  The patient refers having attained 100% relief of the pain for the duration of the local anesthetic.  Unfortunately, once the local anesthetic wore off, he did get approximately 30% ongoing benefit in the area of the left elbow, but none on the right.  Today the patient indicates that he still feels swelling in the area.  In reviewing the procedure, I injected 80 mg of Depo-Medrol between the 2 elbows (40 mg per elbow).  Since he has no diabetes or  problems with hyperglycemia, today we talked about the possibility of repeating the injection  but this time injecting 80 mg of Depo-Medrol per elbow.  We talked about the possibility of perhaps a second treatment providing him with further improvement.  This plan was shared with the patient who understood and accepted.  Post-Procedure Evaluation  Procedure (08/29/2020): Therapeutic bilateral medial epicondyle elbow injection #1, no fluoroscopy or IV sedation Pre-procedure pain level: 8/10 Post-procedure: 8/10 No initial benefit, possibly due to rapid discharge after no sedation procedure, without enough time to allow full onset of block.  Sedation: None.  Effectiveness during initial hour after procedure(Ultra-Short Term Relief): 100 %.  Local anesthetic used: Long-acting (4-6 hours) Effectiveness: Defined as any analgesic benefit obtained secondary to the administration of local anesthetics. This carries significant diagnostic value as to the etiological location, or anatomical origin, of the pain. Duration of benefit is expected to coincide with the duration of the local anesthetic used.  Effectiveness during initial 4-6 hours after procedure(Short-Term Relief): 100 %.  Long-term benefit: Defined as any relief past the pharmacologic duration of the local anesthetics.  Effectiveness past the initial 6 hours after procedure(Long-Term Relief): 30 % (left elbow 30% and right elbow 0%).  Current benefits: Defined as benefit that persist at this time.   Analgesia:  The patient indicates having attained an ongoing 30% relief of his left elbow pain but no relief on the right elbow. Function: Somewhat improved ROM: Somewhat improved  Pharmacotherapy Assessment  Analgesic: Oxycodone IR 5 mg, 1 tab PO BID (10 mg/day of oxycodone) MME/day: 15 mg/day.   Monitoring: [place] PMP: PDMP reviewed during this encounter.       Pharmacotherapy: No side-effects or adverse reactions reported. Compliance: No problems identified. Effectiveness: Clinically acceptable. Plan: Refer to "POC".  UDS:  Summary   Date Value Ref Range Status  03/08/2020 Note  Final    Comment:    ==================================================================== [facility] Select 13 (MW) ==================================================================== Test                             Result       Flag       Units  Drug Absent but Declared for Prescription Verification   Oxycodone                      Not Detected UNEXPECTED ng/mg creat ==================================================================== Test                      Result    Flag   Units      Ref Range   Creatinine              121              mg/dL      >=20 ==================================================================== Declared Medications:  The flagging and interpretation on this report are based on the  following declared medications.  Unexpected results may arise from  inaccuracies in the declared medications.   **Note: The testing scope of this panel includes these medications:   Oxycodone   **Note: The testing scope of this panel does not include the  following reported medications:   Aspirin  Baclofen (Lioresal)  Lenalidomide (Revlimid)  Pregabalin (Lyrica)  Sertraline (Zoloft) ==================================================================== For clinical consultation, please call [phone]. ====================================================================     Laboratory Chemistry Profile   Renal Lab Results  Component Value Date   BUN 17 07/28/2020   CREATININE  1.47 (H) 07/28/2020   BCR 7 (L) 11/26/2017   GFRAA >60 12/31/2019   GFRNONAA 58 (L) 07/28/2020     Hepatic Lab Results  Component Value Date   AST 27 07/28/2020   ALT 22 07/28/2020   ALBUMIN 4.4 07/28/2020   ALKPHOS 64 07/28/2020   LIPASE 37 05/23/2020     Electrolytes Lab Results  Component Value Date   NA 137 07/28/2020   K 3.5 07/28/2020   CL 102 07/28/2020   CALCIUM 8.8 (L) 07/28/2020   MG 2.4 (H) 11/26/2017      Bone Lab Results  Component Value Date   25OHVITD1 41 11/26/2017   25OHVITD2 4.4 11/26/2017   25OHVITD3 37 11/26/2017   TESTOSTERONE 570 12/29/2017     Inflammation (CRP: Acute Phase) (ESR: Chronic Phase) Lab Results  Component Value Date   CRP 9 11/26/2017   ESRSEDRATE 16 (H) 11/26/2017   LATICACIDVEN 1.1 08/11/2017       Note: Above Lab results reviewed.  Imaging  DG Bone Survey Met CLINICAL DATA:  Multiple myeloma  EXAM: METASTATIC BONE SURVEY  COMPARISON:  October 22, 2018  FINDINGS: Skull: Multiple small lucencies throughout the calvarium appear stable, likely due to multiple myeloma. No progression of lytic change. Sella appears normal.  Cervical spine: No appreciable blastic or lytic bone lesions.  Thoracic spine: No blastic or lytic bone lesions. Slight anterior wedging at T6, see T7, and T8 again noted without progression.  Chest: Lungs are clear. Heart size and pulmonary vascular normal. No adenopathy. Old healed fracture lateral right seventh rib equivocal small lucency in the lateral right clavicle is stable. No new lytic appearing lesions. No blastic lesions.  Lumbar spine: No blastic or lytic bone lesions evident. No fracture or spondylolisthesis.  Pelvis: Equivocal lucencies in each femoral neck appear stable. No new or well-defined blastic or lytic bone lesions.  Right femur: Equivocal lucencies in the right femoral neck stable. No new lucency to suggest progression of disease. No abnormal periosteal reaction.  Left femur: Equivocal lucency in the femoral neck region stable. No evidence suggesting progression of disease. No bony destruction. No abnormal periosteal reaction. Lateral left hemiarthroplasty noted, stable.  Right tibia and fibula: No blastic or lytic bone lesions. No abnormal periosteal reaction.  Left tibia and fibula: No blastic or lytic bone lesions. No abnormal periosteal reaction.  Right shoulder and humerus: Equivocal  lucency in the proximal right humerus and lateral right clavicle stable. No progression of potential lytic change. No abnormal periosteal reaction. No blastic lesions.  Left shoulder and humerus: Subtle lytic lesions in the proximal left humerus stable. No progression of lytic change. No abnormal periosteal reaction. No evident blastic lesion.  Right forearm: No blastic or lytic bone lesions. No abnormal periosteal reaction.  Left forearm: No blastic or lytic bone lesions. No abnormal periosteal reaction.  IMPRESSION: In comparison with prior study, multiple small lucencies felt to represent changes of multiple myeloma in the skull appear stable. Somewhat subtle lesions in each proximal humerus and equivocal areas of lucency in each femoral neck and lateral clavicle are stable. No findings suggesting progression of multiple myeloma. No new or enlarging lesions evident.  Mild anterior wedging at T6, T7, and T8 is stable.  Lungs clear.  No thoracic adenopathy on chest radiograph.  Electronically Signed   By: Lowella Grip III M.D.   On: 07/31/2020 09:19  Assessment  The primary encounter diagnosis was Chronic elbow joint pain (Medial aspect) (Bilateral). Diagnoses of Epicondylitis elbow, medial (Left)  and Epicondylitis elbow, medial (Right) were also pertinent to this visit.  Plan of Care  Problem-specific:  No problem-specific Assessment & Plan notes found for this encounter.  Mr. Seward Coran. has a current medication list which includes the following long-term medication(s): baclofen, oxycodone, [START ON 10/04/2020] oxycodone, [START ON 11/03/2020] oxycodone, pregabalin, and sertraline.  Pharmacotherapy (Medications Ordered): No orders of the defined types were placed in this encounter.  Orders:  Orders Placed This Encounter  Procedures  . Medium Joint Injection/Arthrocentesis    Level(s): Bilateral medial epicondyle elbow injection #2 Laterality:  Bilateral Sedation: No Sedation Purpose: Therapeutic Indication(s): Chronic elbow pain    Standing Status:   Future    Standing Expiration Date:   12/20/2020    Scheduling Instructions:     Requested Scheduling Timeframe: ASAA   Follow-up plan:   Return for Procedure (no sedation): (B) medial epicondyle elbow injection #2.      Interventional Therapies  Risk  Complexity Considerations:   Estimated body mass index is 24.28 kg/m as calculated from the following:   Height as of 08/29/20: '5\' 7"'  (1.702 m).   Weight as of 08/29/20: 155 lb (70.3 kg). WNL   Planned  Pending:   Pending further evaluation   Under consideration:   DiagnosticbilateralLESI Possible bilateral lumbar facet RFA Diagnostic bilateral SI block Diagnostic right knee Hyalgan series Diagnosticright knee IA knee injection Diagnostic left knee genicular NB   Completed:   Therapeutic bilateral elbow medial epicondyle steroid injection x1 (08/29/2020)  Palliative bilateral IA hip joint injection x1 (05/26/2018) (50/50/50/50) Palliative bilateral lumbar facet block x2 (10/13/2018) (100/100/0) (60/60/60) Palliative left piriformis muscle injection x1  (07/09/2018) (100/100/0)   Therapeutic  Palliative (PRN) options:   Palliative bilateral IA hip joint injection #2  Palliative bilateral lumbar facet block #3  Palliative left piriformis muscle injection #2     Recent Visits Date Type Provider Dept  08/29/20 Procedure visit Milinda Pointer, MD [facility]  08/28/20 Office Visit Milinda Pointer, MD [facility]  Showing recent visits within past 90 days and meeting all other requirements Today's Visits Date Type Provider Dept  09/19/20 Telemedicine Milinda Pointer, MD [facility]  Showing today's visits and meeting all other requirements Future Appointments Date Type Provider Dept  11/29/20 Appointment Milinda Pointer, MD [facility]  Showing future  appointments within next 90 days and meeting all other requirements  I discussed the assessment and treatment plan with the patient. The patient was provided an opportunity to ask questions and all were answered. The patient agreed with the plan and demonstrated an understanding of the instructions.  Patient advised to call back or seek an in-person evaluation if the symptoms or condition worsens.  Duration of encounter: 15 minutes.  Note by: Gaspar Cola, MD Date: 09/19/2020; Time: 6:26 PM

## 2020-09-19 ENCOUNTER — Encounter: Payer: Self-pay | Admitting: Pain Medicine

## 2020-09-19 ENCOUNTER — Ambulatory Visit: Payer: No Typology Code available for payment source | Attending: Pain Medicine | Admitting: Pain Medicine

## 2020-09-19 ENCOUNTER — Other Ambulatory Visit: Payer: Self-pay

## 2020-09-19 DIAGNOSIS — M25521 Pain in right elbow: Secondary | ICD-10-CM | POA: Diagnosis not present

## 2020-09-19 DIAGNOSIS — M7702 Medial epicondylitis, left elbow: Secondary | ICD-10-CM | POA: Diagnosis not present

## 2020-09-19 DIAGNOSIS — M7701 Medial epicondylitis, right elbow: Secondary | ICD-10-CM | POA: Diagnosis not present

## 2020-09-19 DIAGNOSIS — M25522 Pain in left elbow: Secondary | ICD-10-CM | POA: Diagnosis not present

## 2020-09-19 NOTE — Patient Instructions (Signed)
____________________________________________________________________________________________  Preparing for your procedure (without sedation)  Procedure appointments are limited to planned procedures: . No Prescription Refills. . No disability issues will be discussed. . No medication changes will be discussed.  Instructions: . Oral Intake: Do not eat or drink anything for at least 6 hours prior to your procedure. (Exception: Blood Pressure Medication. See below.) . Transportation: Unless otherwise stated by your physician, you may drive yourself after the procedure. . Blood Pressure Medicine: Do not forget to take your blood pressure medicine with a sip of water the morning of the procedure. If your Diastolic (lower reading)is above 100 mmHg, elective cases will be cancelled/rescheduled. . Blood thinners: These will need to be stopped for procedures. Notify our staff if you are taking any blood thinners. Depending on which one you take, there will be specific instructions on how and when to stop it. . Diabetics on insulin: Notify the staff so that you can be scheduled 1st case in the morning. If your diabetes requires high dose insulin, take only  of your normal insulin dose the morning of the procedure and notify the staff that you have done so. . Preventing infections: Shower with an antibacterial soap the morning of your procedure.  . Build-up your immune system: Take 1000 mg of Vitamin C with every meal (3 times a day) the day prior to your procedure. . Antibiotics: Inform the staff if you have a condition or reason that requires you to take antibiotics before dental procedures. . Pregnancy: If you are pregnant, call and cancel the procedure. . Sickness: If you have a cold, fever, or any active infections, call and cancel the procedure. . Arrival: You must be in the facility at least 30 minutes prior to your scheduled procedure. . Children: Do not bring any children with you. . Dress  appropriately: Bring dark clothing that you would not mind if they get stained. . Valuables: Do not bring any jewelry or valuables.  Reasons to call and reschedule or cancel your procedure: (Following these recommendations will minimize the risk of a serious complication.) . Surgeries: Avoid having procedures within 2 weeks of any surgery. (Avoid for 2 weeks before or after any surgery). . Flu Shots: Avoid having procedures within 2 weeks of a flu shots or . (Avoid for 2 weeks before or after immunizations). . Barium: Avoid having a procedure within 7-10 days after having had a radiological study involving the use of radiological contrast. (Myelograms, Barium swallow or enema study). . Heart attacks: Avoid any elective procedures or surgeries for the initial 6 months after a "Myocardial Infarction" (Heart Attack). . Blood thinners: It is imperative that you stop these medications before procedures. Let us know if you if you take any blood thinner.  . Infection: Avoid procedures during or within two weeks of an infection (including chest colds or gastrointestinal problems). Symptoms associated with infections include: Localized redness, fever, chills, night sweats or profuse sweating, burning sensation when voiding, cough, congestion, stuffiness, runny nose, sore throat, diarrhea, nausea, vomiting, cold or Flu symptoms, recent or current infections. It is specially important if the infection is over the area that we intend to treat. . Heart and lung problems: Symptoms that may suggest an active cardiopulmonary problem include: cough, chest pain, breathing difficulties or shortness of breath, dizziness, ankle swelling, uncontrolled high or unusually low blood pressure, and/or palpitations. If you are experiencing any of these symptoms, cancel your procedure and contact your primary care physician for an evaluation.  Remember:  Regular   Business hours are:  Monday to Thursday 8:00 AM to 4:00  PM  Provider's Schedule: Teryn Gust, MD:  Procedure days: Tuesday and Thursday 7:30 AM to 4:00 PM  Bilal Lateef, MD:  Procedure days: Monday and Wednesday 7:30 AM to 4:00 PM ____________________________________________________________________________________________    

## 2020-09-20 ENCOUNTER — Other Ambulatory Visit: Payer: Self-pay | Admitting: Oncology

## 2020-09-20 ENCOUNTER — Other Ambulatory Visit (HOSPITAL_COMMUNITY): Payer: Self-pay

## 2020-09-20 MED ORDER — SERTRALINE HCL 25 MG PO TABS
ORAL_TABLET | Freq: Every day | ORAL | 3 refills | Status: DC
  Filled 2020-09-20: qty 30, 30d supply, fill #0
  Filled 2020-10-30: qty 30, 30d supply, fill #1
  Filled 2020-12-04: qty 30, 30d supply, fill #2
  Filled 2021-02-05: qty 30, 30d supply, fill #3

## 2020-09-21 ENCOUNTER — Other Ambulatory Visit (HOSPITAL_COMMUNITY): Payer: Self-pay

## 2020-09-22 ENCOUNTER — Other Ambulatory Visit (HOSPITAL_COMMUNITY): Payer: Self-pay

## 2020-09-27 ENCOUNTER — Other Ambulatory Visit (HOSPITAL_COMMUNITY): Payer: Self-pay

## 2020-09-27 ENCOUNTER — Other Ambulatory Visit: Payer: Self-pay | Admitting: Primary Care

## 2020-09-27 DIAGNOSIS — M792 Neuralgia and neuritis, unspecified: Secondary | ICD-10-CM

## 2020-09-27 DIAGNOSIS — T451X5A Adverse effect of antineoplastic and immunosuppressive drugs, initial encounter: Secondary | ICD-10-CM

## 2020-09-27 MED ORDER — PREGABALIN 150 MG PO CAPS
ORAL_CAPSULE | Freq: Three times a day (TID) | ORAL | 0 refills | Status: DC
  Filled 2020-09-27: qty 270, 90d supply, fill #0

## 2020-09-27 NOTE — Telephone Encounter (Signed)
Last seen 04/25/2020 patient is seen by pain clinic as well.

## 2020-09-27 NOTE — Telephone Encounter (Signed)
PMP aware reviewed, no suspicious activity. Refill sent to pharmacy

## 2020-09-28 ENCOUNTER — Other Ambulatory Visit (HOSPITAL_COMMUNITY): Payer: Self-pay

## 2020-10-03 ENCOUNTER — Ambulatory Visit: Payer: No Typology Code available for payment source | Attending: Pain Medicine | Admitting: Pain Medicine

## 2020-10-03 ENCOUNTER — Other Ambulatory Visit (HOSPITAL_COMMUNITY): Payer: Self-pay

## 2020-10-03 DIAGNOSIS — S53442S Ulnar collateral ligament sprain of left elbow, sequela: Secondary | ICD-10-CM | POA: Insufficient documentation

## 2020-10-03 DIAGNOSIS — M7702 Medial epicondylitis, left elbow: Secondary | ICD-10-CM | POA: Insufficient documentation

## 2020-10-03 DIAGNOSIS — M7701 Medial epicondylitis, right elbow: Secondary | ICD-10-CM | POA: Insufficient documentation

## 2020-10-03 NOTE — Progress Notes (Deleted)
The patient had to reschedule due to work scheduling problems

## 2020-10-04 ENCOUNTER — Other Ambulatory Visit (HOSPITAL_COMMUNITY): Payer: Self-pay

## 2020-10-10 ENCOUNTER — Ambulatory Visit: Payer: No Typology Code available for payment source | Admitting: Pain Medicine

## 2020-10-10 NOTE — Progress Notes (Deleted)
No-show to procedure  

## 2020-10-11 ENCOUNTER — Other Ambulatory Visit: Payer: Self-pay | Admitting: *Deleted

## 2020-10-11 DIAGNOSIS — C9001 Multiple myeloma in remission: Secondary | ICD-10-CM

## 2020-10-11 MED ORDER — LENALIDOMIDE 5 MG PO CAPS: 5.0000 mg | ORAL_CAPSULE | Freq: Every day | ORAL | 0 refills | Status: DC

## 2020-10-30 ENCOUNTER — Other Ambulatory Visit: Payer: Self-pay | Admitting: Primary Care

## 2020-10-30 ENCOUNTER — Other Ambulatory Visit (HOSPITAL_COMMUNITY): Payer: Self-pay

## 2020-10-30 DIAGNOSIS — M62830 Muscle spasm of back: Secondary | ICD-10-CM

## 2020-10-30 DIAGNOSIS — M7918 Myalgia, other site: Secondary | ICD-10-CM

## 2020-10-31 ENCOUNTER — Ambulatory Visit: Payer: No Typology Code available for payment source | Admitting: Pain Medicine

## 2020-10-31 ENCOUNTER — Other Ambulatory Visit (HOSPITAL_COMMUNITY): Payer: Self-pay

## 2020-10-31 MED ORDER — BACLOFEN 10 MG PO TABS
ORAL_TABLET | ORAL | 0 refills | Status: DC
  Filled 2020-10-31: qty 360, 90d supply, fill #0

## 2020-11-02 ENCOUNTER — Other Ambulatory Visit (HOSPITAL_COMMUNITY): Payer: Self-pay

## 2020-11-03 ENCOUNTER — Other Ambulatory Visit (HOSPITAL_COMMUNITY): Payer: Self-pay

## 2020-11-06 ENCOUNTER — Other Ambulatory Visit: Payer: Self-pay | Admitting: *Deleted

## 2020-11-06 DIAGNOSIS — C9001 Multiple myeloma in remission: Secondary | ICD-10-CM

## 2020-11-06 MED ORDER — LENALIDOMIDE 5 MG PO CAPS: 5.0000 mg | ORAL_CAPSULE | Freq: Every day | ORAL | 0 refills | Status: DC

## 2020-11-29 ENCOUNTER — Encounter: Payer: No Typology Code available for payment source | Admitting: Pain Medicine

## 2020-11-30 ENCOUNTER — Other Ambulatory Visit: Payer: Self-pay | Admitting: Pain Medicine

## 2020-11-30 ENCOUNTER — Other Ambulatory Visit (HOSPITAL_COMMUNITY): Payer: Self-pay

## 2020-11-30 DIAGNOSIS — G894 Chronic pain syndrome: Secondary | ICD-10-CM

## 2020-11-30 DIAGNOSIS — Z79899 Other long term (current) drug therapy: Secondary | ICD-10-CM

## 2020-11-30 NOTE — Telephone Encounter (Signed)
Spoke to patient regarding medication refill. Informed him Dr. Dossie Arbour is booked out with appointments. he states his meds run out on Monday. He missed last 4 appointments. Only option is to call on Monday at 0830 to see if there has been a cancellation that he can be added or possibly will need to have a drug holiday. I have spoken to Dr. Dossie Arbour regarding patient. Will update Kori.

## 2020-12-04 ENCOUNTER — Other Ambulatory Visit (HOSPITAL_COMMUNITY): Payer: Self-pay

## 2020-12-04 ENCOUNTER — Encounter: Payer: Self-pay | Admitting: Pain Medicine

## 2020-12-04 ENCOUNTER — Other Ambulatory Visit: Payer: Self-pay

## 2020-12-04 ENCOUNTER — Encounter: Payer: No Typology Code available for payment source | Admitting: Pain Medicine

## 2020-12-04 ENCOUNTER — Ambulatory Visit: Payer: No Typology Code available for payment source | Attending: Pain Medicine | Admitting: Pain Medicine

## 2020-12-04 VITALS — BP 157/93 | HR 68 | Temp 96.7°F | Ht 67.0 in | Wt 160.0 lb

## 2020-12-04 DIAGNOSIS — M5442 Lumbago with sciatica, left side: Secondary | ICD-10-CM | POA: Insufficient documentation

## 2020-12-04 DIAGNOSIS — M79605 Pain in left leg: Secondary | ICD-10-CM | POA: Diagnosis present

## 2020-12-04 DIAGNOSIS — Z79899 Other long term (current) drug therapy: Secondary | ICD-10-CM | POA: Insufficient documentation

## 2020-12-04 DIAGNOSIS — M7702 Medial epicondylitis, left elbow: Secondary | ICD-10-CM | POA: Insufficient documentation

## 2020-12-04 DIAGNOSIS — G8929 Other chronic pain: Secondary | ICD-10-CM | POA: Insufficient documentation

## 2020-12-04 DIAGNOSIS — Z79891 Long term (current) use of opiate analgesic: Secondary | ICD-10-CM | POA: Diagnosis present

## 2020-12-04 DIAGNOSIS — M25522 Pain in left elbow: Secondary | ICD-10-CM | POA: Insufficient documentation

## 2020-12-04 DIAGNOSIS — M5441 Lumbago with sciatica, right side: Secondary | ICD-10-CM | POA: Insufficient documentation

## 2020-12-04 DIAGNOSIS — M25561 Pain in right knee: Secondary | ICD-10-CM | POA: Diagnosis present

## 2020-12-04 DIAGNOSIS — G894 Chronic pain syndrome: Secondary | ICD-10-CM | POA: Diagnosis not present

## 2020-12-04 DIAGNOSIS — M7701 Medial epicondylitis, right elbow: Secondary | ICD-10-CM | POA: Diagnosis not present

## 2020-12-04 DIAGNOSIS — M25562 Pain in left knee: Secondary | ICD-10-CM | POA: Diagnosis present

## 2020-12-04 DIAGNOSIS — M5416 Radiculopathy, lumbar region: Secondary | ICD-10-CM | POA: Insufficient documentation

## 2020-12-04 DIAGNOSIS — M25521 Pain in right elbow: Secondary | ICD-10-CM | POA: Diagnosis not present

## 2020-12-04 DIAGNOSIS — M79604 Pain in right leg: Secondary | ICD-10-CM | POA: Diagnosis present

## 2020-12-04 MED ORDER — OXYCODONE HCL 5 MG PO TABS
5.0000 mg | ORAL_TABLET | Freq: Three times a day (TID) | ORAL | 0 refills | Status: DC | PRN
Start: 2021-03-04 — End: 2021-03-27
  Filled 2021-03-05: qty 90, 30d supply, fill #0
  Filled ????-??-??: fill #0

## 2020-12-04 MED ORDER — OXYCODONE HCL 5 MG PO TABS
5.0000 mg | ORAL_TABLET | Freq: Three times a day (TID) | ORAL | 0 refills | Status: DC | PRN
Start: 2021-01-03 — End: 2021-03-27
  Filled 2021-01-03: qty 90, 30d supply, fill #0

## 2020-12-04 MED ORDER — OXYCODONE HCL 5 MG PO TABS
5.0000 mg | ORAL_TABLET | Freq: Three times a day (TID) | ORAL | 0 refills | Status: DC | PRN
Start: 2021-02-02 — End: 2021-03-27
  Filled 2021-02-02: qty 90, 30d supply, fill #0

## 2020-12-04 MED ORDER — OXYCODONE HCL 5 MG PO TABS
5.0000 mg | ORAL_TABLET | Freq: Three times a day (TID) | ORAL | 0 refills | Status: DC | PRN
Start: 2020-12-04 — End: 2021-03-27
  Filled 2020-12-04: qty 90, 30d supply, fill #0

## 2020-12-04 NOTE — Patient Instructions (Signed)
____________________________________________________________________________________________  Medication Rules  Purpose: To inform patients, and their family members, of our rules and regulations.  Applies to: All patients receiving prescriptions (written or electronic).  Pharmacy of record: Pharmacy where electronic prescriptions will be sent. If written prescriptions are taken to a different pharmacy, please inform the nursing staff. The pharmacy listed in the electronic medical record should be the one where you would like electronic prescriptions to be sent.  Electronic prescriptions: In compliance with the [place]reng[place] Opioid Misuse Prevention (STOP) Act of 2017 (Session Law 2017-74/H243), effective April 22, 2018, all controlled substances must be electronically prescribed. Calling prescriptions to the pharmacy will cease to exist.  Prescription refills: Only during scheduled appointments. Applies to all prescriptions.  NOTE: The following applies primarily to controlled substances (Opioid* Pain Medications).   Type of encounter (visit): For patients receiving controlled substances, face-to-face visits are required. (Not an option or up to the patient.)  Patient's responsibilities: Pain Pills: Bring all pain pills to every appointment (except for procedure appointments). Pill Bottles: Bring pills in original pharmacy bottle. Always bring the newest bottle. Bring bottle, even if empty. Medication refills: You are responsible for knowing and keeping track of what medications you take and those you need refilled. The day before your appointment: write a list of all prescriptions that need to be refilled. The day of the appointment: give the list to the admitting nurse. Prescriptions will be written only during appointments. No prescriptions will be written on procedure days. If you forget a medication: it will not be "Called in", "Faxed", or "electronically sent". You will  need to get another appointment to get these prescribed. No early refills. Do not call asking to have your prescription filled early. Prescription Accuracy: You are responsible for carefully inspecting your prescriptions before leaving our office. Have the discharge nurse carefully go over each prescription with you, before taking them home. Make sure that your name is accurately spelled, that your address is correct. Check the name and dose of your medication to make sure it is accurate. Check the number of pills, and the written instructions to make sure they are clear and accurate. Make sure that you are given enough medication to last until your next medication refill appointment. Taking Medication: Take medication as prescribed. When it comes to controlled substances, taking less pills or less frequently than prescribed is permitted and encouraged. Never take more pills than instructed. Never take medication more frequently than prescribed.  Inform other Doctors: Always inform, all of your healthcare providers, of all the medications you take. Pain Medication from other Providers: You are not allowed to accept any additional pain medication from any other Doctor or Healthcare provider. There are two exceptions to this rule. (see below) In the event that you require additional pain medication, you are responsible for notifying us, as stated below. Cough Medicine: Often these contain an opioid, such as codeine or hydrocodone. Never accept or take cough medicine containing these opioids if you are already taking an opioid* medication. The combination may cause respiratory failure and death. Medication Agreement: You are responsible for carefully reading and following our Medication Agreement. This must be signed before receiving any prescriptions from our practice. Safely store a copy of your signed Agreement. Violations to the Agreement will result in no further prescriptions. (Additional copies of our  Medication Agreement are available upon request.) Laws, Rules, & Regulations: All patients are expected to follow all Federal and State Laws, Statutes, Rules, & Regulations. Ignorance of   the Laws does not constitute a valid excuse.  Illegal drugs and Controlled Substances: The use of illegal substances (including, but not limited to marijuana and its derivatives) and/or the illegal use of any controlled substances is strictly prohibited. Violation of this rule may result in the immediate and permanent discontinuation of any and all prescriptions being written by our practice. The use of any illegal substances is prohibited. Adopted CDC guidelines & recommendations: Target dosing levels will be at or below 60 MME/day. Use of benzodiazepines** is not recommended.  Exceptions: There are only two exceptions to the rule of not receiving pain medications from other Healthcare Providers. Exception #1 (Emergencies): In the event of an emergency (i.e.: accident requiring emergency care), you are allowed to receive additional pain medication. However, you are responsible for: As soon as you are able, call our office [phone], at any time of the day or night, and leave a message stating your name, the date and nature of the emergency, and the name and dose of the medication prescribed. In the event that your call is answered by a member of our staff, make sure to document and save the date, time, and the name of the person that took your information.  Exception #2 (Planned Surgery): In the event that you are scheduled by another doctor or dentist to have any type of surgery or procedure, you are allowed (for a period no longer than 30 days), to receive additional pain medication, for the acute post-op pain. However, in this case, you are responsible for picking up a copy of our "Post-op Pain Management for Surgeons" handout, and giving it to your surgeon or dentist. This document is available at our office, and  does not require an appointment to obtain it. Simply go to our office during business hours (Monday-Thursday from 8:00 AM to 4:00 PM) (Friday 8:00 AM to 12:00 Noon) or if you have a scheduled appointment with us, prior to your surgery, and ask for it by name. In addition, you are responsible for: calling our office [phone], at any time of the day or night, and leaving a message stating your name, name of your surgeon, type of surgery, and date of procedure or surgery. Failure to comply with your responsibilities may result in termination of therapy involving the controlled substances.  *Opioid medications include: morphine, codeine, oxycodone, oxymorphone, hydrocodone, hydromorphone, meperidine, tramadol, tapentadol, buprenorphine, fentanyl, methadone. **Benzodiazepine medications include: diazepam (Valium), alprazolam (Xanax), clonazepam (Klonopine), lorazepam (Ativan), clorazepate (Tranxene), chlordiazepoxide (Librium), estazolam (Prosom), oxazepam (Serax), temazepam (Restoril), triazolam (Halcion) (Last updated: 03/20/2020) ____________________________________________________________________________________________  ____________________________________________________________________________________________  Medication Recommendations and Reminders  Applies to: All patients receiving prescriptions (written and/or electronic).  Medication Rules & Regulations: These rules and regulations exist for your safety and that of others. They are not flexible and neither are we. Dismissing or ignoring them will be considered "non-compliance" with medication therapy, resulting in complete and irreversible termination of such therapy. (See document titled "Medication Rules" for more details.) In all conscience, because of safety reasons, we cannot continue providing a therapy where the patient does not follow instructions.  Pharmacy of record:  Definition: This is the pharmacy where your electronic  prescriptions will be sent.  We do not endorse any particular pharmacy, however, we have experienced problems with [facility] not securing enough medication supply for the community. We do not restrict you in your choice of pharmacy. However, once we write for your prescriptions, we will NOT be re-sending more prescriptions to fix restricted supply problems   created by your pharmacy, or your insurance.  The pharmacy listed in the electronic medical record should be the one where you want electronic prescriptions to be sent. If you choose to change pharmacy, simply notify our nursing staff.  Recommendations: Keep all of your pain medications in a safe place, under lock and key, even if you live alone. We will NOT replace lost, stolen, or damaged medication. After you fill your prescription, take 1 week's worth of pills and put them away in a safe place. You should keep a separate, properly labeled bottle for this purpose. The remainder should be kept in the original bottle. Use this as your primary supply, until it runs out. Once it's gone, then you know that you have 1 week's worth of medicine, and it is time to come in for a prescription refill. If you do this correctly, it is unlikely that you will ever run out of medicine. To make sure that the above recommendation works, it is very important that you make sure your medication refill appointments are scheduled at least 1 week before you run out of medicine. To do this in an effective manner, make sure that you do not leave the office without scheduling your next medication management appointment. Always ask the nursing staff to show you in your prescription , when your medication will be running out. Then arrange for the receptionist to get you a return appointment, at least 7 days before you run out of medicine. Do not wait until you have 1 or 2 pills left, to come in. This is very poor planning and does not take into consideration that we may need to  cancel appointments due to bad weather, sickness, or emergencies affecting our staff. DO NOT ACCEPT A "Partial Fill": If for any reason your pharmacy does not have enough pills/tablets to completely fill or refill your prescription, do not allow for a "partial fill". The law allows the pharmacy to complete that prescription within 72 hours, without requiring a new prescription. If they do not fill the rest of your prescription within those 72 hours, you will need a separate prescription to fill the remaining amount, which we will NOT provide. If the reason for the partial fill is your insurance, you will need to talk to the pharmacist about payment alternatives for the remaining tablets, but again, DO NOT ACCEPT A PARTIAL FILL, unless you can trust your pharmacist to obtain the remainder of the pills within 72 hours.  Prescription refills and/or changes in medication(s):  Prescription refills, and/or changes in dose or medication, will be conducted only during scheduled medication management appointments. (Applies to both, written and electronic prescriptions.) No refills on procedure days. No medication will be changed or started on procedure days. No changes, adjustments, and/or refills will be conducted on a procedure day. Doing so will interfere with the diagnostic portion of the procedure. No phone refills. No medications will be "called into the pharmacy". No Fax refills. No weekend refills. No Holliday refills. No after hours refills.  Remember:  Business hours are:  Monday to Thursday 8:00 AM to 4:00 PM Provider's Schedule: Lateka Rady, MD - Appointments are:  Medication management: Monday and Wednesday 8:00 AM to 4:00 PM Procedure day: Tuesday and Thursday 7:30 AM to 4:00 PM Bilal Lateef, MD - Appointments are:  Medication management: Tuesday and Thursday 8:00 AM to 4:00 PM Procedure day: Monday and Wednesday 7:30 AM to 4:00 PM (Last update:  11/10/2019) ____________________________________________________________________________________________  ____________________________________________________________________________________________  CBD (cannabidiol) WARNING    Applicable to: All individuals currently taking or considering taking CBD (cannabidiol) and, more important, all patients taking opioid analgesic controlled substances (pain medication). (Example: oxycodone; oxymorphone; hydrocodone; hydromorphone; morphine; methadone; tramadol; tapentadol; fentanyl; buprenorphine; butorphanol; dextromethorphan; meperidine; codeine; etc.)  Legal status: CBD remains a Schedule I drug prohibited for any use. CBD is illegal with one exception. In the [place], CBD has a limited Food and Drug Administration (FDA) approval for the treatment of two specific types of epilepsy disorders. Only one CBD product has been approved by the FDA for this purpose: "Epidiolex". FDA is aware that some companies are marketing products containing cannabis and cannabis-derived compounds in ways that violate the Federal Food, Drug and Cosmetic Act (FD&C Act) and that may put the health and safety of consumers at risk. The FDA, a Federal agency, has not enforced the CBD status since 2018.   Legality: Some manufacturers ship CBD products nationally, which is illegal. Often such products are sold online and are therefore available throughout the country. CBD is openly sold in head shops and health food stores in some states where such sales have not been explicitly legalized. Selling unapproved products with unsubstantiated therapeutic claims is not only a violation of the law, but also can put patients at risk, as these products have not been proven to be safe or effective. Federal illegality makes it difficult to conduct research on CBD.  Reference: "FDA Regulation of Cannabis and Cannabis-Derived Products, Including Cannabidiol (CBD)" -  https://www.fda.gov/news-events/public-health-focus/fda-regulation-cannabis-and-cannabis-derived-products-including-cannabidiol-cbd  Warning: CBD is not FDA approved and has not undergo the same manufacturing controls as prescription drugs.  This means that the purity and safety of available CBD may be questionable. Most of the time, despite manufacturer's claims, it is contaminated with THC (delta-9-tetrahydrocannabinol - the chemical in marijuana responsible for the "HIGH").  When this is the case, the THC contaminant will trigger a positive urine drug screen (UDS) test for Marijuana (carboxy-THC). Because a positive UDS for any illicit substance is a violation of our medication agreement, your opioid analgesics (pain medicine) may be permanently discontinued.  MORE ABOUT CBD  General Information: CBD  is a derivative of the Marijuana (cannabis sativa) plant discovered in 1940. It is one of the 113 identified substances found in Marijuana. It accounts for up to 40% of the plant's extract. As of 2018, preliminary clinical studies on CBD included research for the treatment of anxiety, movement disorders, and pain. CBD is available and consumed in multiple forms, including inhalation of smoke or vapor, as an aerosol spray, and by mouth. It may be supplied as an oil containing CBD, capsules, dried cannabis, or as a liquid solution. CBD is thought not to be as psychoactive as THC (delta-9-tetrahydrocannabinol - the chemical in marijuana responsible for the "HIGH"). Studies suggest that CBD may interact with different biological target receptors in the body, including cannabinoid and other neurotransmitter receptors. As of 2018 the mechanism of action for its biological effects has not been determined.  Side-effects  Adverse reactions: Dry mouth, diarrhea, decreased appetite, fatigue, drowsiness, malaise, weakness, sleep disturbances, and others.  Drug interactions: CBC may interact with other medications  such as blood-thinners. (Last update: 11/27/2019) ____________________________________________________________________________________________  ____________________________________________________________________________________________  Drug Holidays (Slow)  What is a "Drug Holiday"? Drug Holiday: is the name given to the period of time during which a patient stops taking a medication(s) for the purpose of eliminating tolerance to the drug.  Benefits Improved effectiveness of opioids. Decreased opioid dose needed to achieve benefits. Improved pain with lesser dose.    What is tolerance? Tolerance: is the progressive decreased in effectiveness of a drug due to its repetitive use. With repetitive use, the body gets use to the medication and as a consequence, it loses its effectiveness. This is a common problem seen with opioid pain medications. As a result, a larger dose of the drug is needed to achieve the same effect that used to be obtained with a smaller dose.  How long should a "Drug Holiday" last? You should stay off of the pain medicine for at least 14 consecutive days. (2 weeks)  Should I stop the medicine "cold turkey"? No. You should always coordinate with your Pain Specialist so that he/she can provide you with the correct medication dose to make the transition as smoothly as possible.  How do I stop the medicine? Slowly. You will be instructed to decrease the daily amount of pills that you take by one (1) pill every seven (7) days. This is called a "slow downward taper" of your dose. For example: if you normally take four (4) pills per day, you will be asked to drop this dose to three (3) pills per day for seven (7) days, then to two (2) pills per day for seven (7) days, then to one (1) per day for seven (7) days, and at the end of those last seven (7) days, this is when the "Drug Holiday" would start.   Will I have withdrawals? By doing a "slow downward taper" like this one, it  is unlikely that you will experience any significant withdrawal symptoms. Typically, what triggers withdrawals is the sudden stop of a high dose opioid therapy. Withdrawals can usually be avoided by slowly decreasing the dose over a prolonged period of time. If you do not follow these instructions and decide to stop your medication abruptly, withdrawals may be possible.  What are withdrawals? Withdrawals: refers to the wide range of symptoms that occur after stopping or dramatically reducing opiate drugs after heavy and prolonged use. Withdrawal symptoms do not occur to patients that use low dose opioids, or those who take the medication sporadically. Contrary to benzodiazepine (example: Valium, Xanax, etc.) or alcohol withdrawals ("Delirium Tremens"), opioid withdrawals are not lethal. Withdrawals are the physical manifestation of the body getting rid of the excess receptors.  Expected Symptoms Early symptoms of withdrawal may include: Agitation Anxiety Muscle aches Increased tearing Insomnia Runny nose Sweating Yawning  Late symptoms of withdrawal may include: Abdominal cramping Diarrhea Dilated pupils Goose bumps Nausea Vomiting  Will I experience withdrawals? Due to the slow nature of the taper, it is very unlikely that you will experience any.  What is a slow taper? Taper: refers to the gradual decrease in dose.  (Last update: 11/10/2019) ____________________________________________________________________________________________    

## 2020-12-04 NOTE — Progress Notes (Signed)
Nursing Pain Medication Assessment:  Safety precautions to be maintained throughout the outpatient stay will include: orient to surroundings, keep bed in low position, maintain call bell within reach at all times, provide assistance with transfer out of bed and ambulation.  Medication Inspection Compliance: Pill count conducted under aseptic conditions, in front of the patient. Neither the pills nor the bottle was removed from the patient's sight at any time. Once count was completed pills were immediately returned to the patient in their original bottle.  Medication: Oxycodone IR Pill/Patch Count:  0 of 90 pills remain Pill/Patch Appearance: Markings consistent with prescribed medication Bottle Appearance: Standard pharmacy container. Clearly labeled. Filled Date: 7/15 /2022 Last Medication intake:  Today Safety precautions to be maintained throughout the outpatient stay will include: orient to surroundings, keep bed in low position, maintain call bell within reach at all times, provide assistance with transfer out of bed and ambulation.

## 2020-12-04 NOTE — Progress Notes (Signed)
PROVIDER NOTE: Information contained herein reflects review and annotations entered in association with encounter. Interpretation of such information and data should be left to medically-trained personnel. Information provided to patient can be located elsewhere in the medical record under "Patient Instructions". Document created using STT-dictation technology, any transcriptional errors that may result from process are unintentional.    Patient: James House.  Service Category: E/M  Provider: Gaspar Cola, MD  DOB: [birth year]  DOS: 12/04/2020  Specialty: Interventional Pain Management  MRN: [id number]  Setting: Ambulatory outpatient  PCP: Pleas Koch, NP  Type: Established Patient    Referring Provider: Pleas Koch, NP  Location: Office  Delivery: Face-to-face     HPI  Mr. James Gomillion., a 50 y.o. year old male, is here today because of his Chronic pain syndrome [G89.4]. Mr. James House primary complain today is Back Pain Last encounter: My last encounter with him was on 11/30/2020. Pertinent problems: Mr. James House has Multiple myeloma ([place]); Chronic low back pain (Bilateral) (L>R) w/ sciatica (Bilateral); Chronic lower extremity pain (2ry area of Pain) (Bilateral) (L>R); Chronic knee pain (3ry area of Pain) (Bilateral) (L>R); Chronic pain syndrome; Chemotherapy-induced peripheral neuropathy ([place]); Neurogenic pain; Neuropathic pain; History of Partial knee replacement (Left); Chronic knee pain s/p partial replacement (Left); Osteoarthritis of knee (Right); Chronic hip pain (Bilateral) (R>L); Chronic sacroiliac joint pain (Bilateral) (R>L); Other specified dorsopathies, sacral and sacrococcygeal region; Lumbar facet syndrome (Bilateral) (L>R); Spondylosis without myelopathy or radiculopathy, lumbar region; Chronic lumbar radiculopathy (by EMG/PNCV) (L5) (Right); Chronic low back pain (1ry area of Pain) (Bilateral) (L>R) w/o sciatica; Spasm of back muscles; Chronic musculoskeletal pain;  Osteoarthritis of hips (Bilateral); Piriformis syndrome (Left); Piriformis muscle pain (Left); Spasm of piriformis muscle (Left); DDD (degenerative disc disease), lumbosacral; Shingles; Thoracic radiculitis (Right); DDD (degenerative disc disease), thoracic; Non-traumatic compression fracture of T7 thoracic vertebra, sequela; Olecranon bursitis (Bilateral); Chronic elbow joint pain (Medial aspect) (Bilateral); Epicondylitis elbow, medial (Left); Epicondylitis elbow, medial (Right); Medial epicondylitis of elbows (Bilateral); Ulnar collateral ligament sprain of right elbow, sequela; Ulnar collateral ligament sprain of left elbow, sequela; Golfer's elbow (Left); and Golfer's elbow (Right) on their pertinent problem list. Pain Assessment: Severity of Chronic pain is reported as a 6 /10. Location: Back Right, Left/pain radiaties down his leg to his calf. Onset: More than a month ago. Quality: Stabbing, Aching, Discomfort, Constant. Timing: Constant. Modifying factor(s): meds and laying down. Vitals:  height is _0  (1.702 m) and weight is 160 lb (72.6 kg). His temperature is 96.7 F (35.9 C) (abnormal). His blood pressure is 157/93 (abnormal) and his pulse is 68. His oxygen saturation is 100%.   Reason for encounter: medication management.   The patient indicates doing well with the current medication regimen. No adverse reactions or side effects reported to the medications.  Patient given final warning regarding missing appointments, and no-shows.  At this point my practice has saturated to the point where I have explained to him that I will not have anything available for the next 3 months, no treatment for phone calls.  This is the reason why I have sent 4 prescriptions to his pharmacy today.  RTCB: 04/03/2021 Nonopioids transfer 01/31/2020: Baclofen and Lyrica  Pharmacotherapy Assessment  Analgesic: Oxycodone IR 5 mg, 1 tab PO BID (10 mg/day of oxycodone) MME/day: 15 mg/day.   Monitoring: [place] PMP:  PDMP reviewed during this encounter.       Pharmacotherapy: No side-effects or adverse reactions reported. Compliance: No problems identified. Effectiveness: Clinically  acceptable.  Chauncey Fischer, RN  12/04/2020  8:48 AM  Sign when Signing Visit Nursing Pain Medication Assessment:  Safety precautions to be maintained throughout the outpatient stay will include: orient to surroundings, keep bed in low position, maintain call bell within reach at all times, provide assistance with transfer out of bed and ambulation.  Medication Inspection Compliance: Pill count conducted under aseptic conditions, in front of the patient. Neither the pills nor the bottle was removed from the patient's sight at any time. Once count was completed pills were immediately returned to the patient in their original bottle.  Medication: Oxycodone IR Pill/Patch Count:  0 of 90 pills remain Pill/Patch Appearance: Markings consistent with prescribed medication Bottle Appearance: Standard pharmacy container. Clearly labeled. Filled Date: 7/15 /2022 Last Medication intake:  Today Safety precautions to be maintained throughout the outpatient stay will include: orient to surroundings, keep bed in low position, maintain call bell within reach at all times, provide assistance with transfer out of bed and ambulation.       UDS:  Summary  Date Value Ref Range Status  03/08/2020 Note  Final    Comment:    ==================================================================== ToxASSU[employer] Select 13 (MW) ==================================================================== Test                             Result       Flag       Units  Drug Absent but Declared for Prescription Verification   Oxycodone                      Not Detected UNEXPECTED ng/mg creat ==================================================================== Test                      Result    Flag   Units      Ref Range   Creatinine              121               mg/dL      >=20 ==================================================================== Declared Medications:  The flagging and interpretation on this report are based on the  following declared medications.  Unexpected results may arise from  inaccuracies in the declared medications.   **Note: The testing scope of this panel includes these medications:   Oxycodone   **Note: The testing scope of this panel does not include the  following reported medications:   Aspirin  Baclofen (Lioresal)  Lenalidomide (Revlimid)  Pregabalin (Lyrica)  Sertraline (Zoloft) ==================================================================== For clinical consultation, please call [phone]. ====================================================================      ROS  Constitutional: Denies any fever or chills Gastrointestinal: No reported hemesis, hematochezia, vomiting, or acute GI distress Musculoskeletal: Denies any acute onset joint swelling, redness, loss of ROM, or weakness Neurological: No reported episodes of acute onset apraxia, aphasia, dysarthria, agnosia, amnesia, paralysis, loss of coordination, or loss of consciousness  Medication Review  aspirin EC, baclofen, lenalidomide, oxyCODONE, pregabalin, and sertraline  History Review  Allergy: Mr. James House has No Known Allergies. Drug: Mr. James House  reports no history of drug use. Alcohol:  reports no history of alcohol use. Tobacco:  reports that he has quit smoking. His smokeless tobacco use includes snuff. Social: Mr. James House  reports that he has quit smoking. His smokeless tobacco use includes snuff. He reports that he does not drink alcohol and does not use drugs. Medical:  has a past medical history of CKD (chronic  kidney disease) stage 3, GFR 30-59 ml/min ([place]), Hypertension, Multiple myeloma ([place]) (08/11/2017), Neuropathy, and Pneumonia. Surgical: Mr. Dobie  has a past surgical history that includes Joint replacement; Knee surgery  (Left); and Abdominal surgery. Family: family history includes COPD in his maternal grandmother and mother; Cancer in his maternal uncle; Cancer (age of onset: 35) in his maternal grandmother; Cancer (age of onset: 36) in his mother; Diabetes in his maternal grandmother and mother; Hyperlipidemia in his mother; Hypertension in his maternal grandmother and mother.  Laboratory Chemistry Profile   Renal Lab Results  Component Value Date   BUN 17 07/28/2020   CREATININE 1.47 (H) 07/28/2020   BCR 7 (L) 11/26/2017   GFRAA >60 12/31/2019   GFRNONAA 58 (L) 07/28/2020    Hepatic Lab Results  Component Value Date   AST 27 07/28/2020   ALT 22 07/28/2020   ALBUMIN 4.4 07/28/2020   ALKPHOS 64 07/28/2020   LIPASE 37 05/23/2020    Electrolytes Lab Results  Component Value Date   NA 137 07/28/2020   K 3.5 07/28/2020   CL 102 07/28/2020   CALCIUM 8.8 (L) 07/28/2020   MG 2.4 (H) 11/26/2017    Bone Lab Results  Component Value Date   25OHVITD1 41 11/26/2017   25OHVITD2 4.4 11/26/2017   25OHVITD3 37 11/26/2017   TESTOSTERONE 570 12/29/2017    Inflammation (CRP: Acute Phase) (ESR: Chronic Phase) Lab Results  Component Value Date   CRP 9 11/26/2017   ESRSEDRATE 16 (H) 11/26/2017   LATICACIDVEN 1.1 08/11/2017         Note: Above Lab results reviewed.  Recent Imaging Review  DG Bone Survey Met CLINICAL DATA:  Multiple myeloma  EXAM: METASTATIC BONE SURVEY  COMPARISON:  October 22, 2018  FINDINGS: Skull: Multiple small lucencies throughout the calvarium appear stable, likely due to multiple myeloma. No progression of lytic change. Sella appears normal.  Cervical spine: No appreciable blastic or lytic bone lesions.  Thoracic spine: No blastic or lytic bone lesions. Slight anterior wedging at T6, see T7, and T8 again noted without progression.  Chest: Lungs are clear. Heart size and pulmonary vascular normal. No adenopathy. Old healed fracture lateral right seventh rib  equivocal small lucency in the lateral right clavicle is stable. No new lytic appearing lesions. No blastic lesions.  Lumbar spine: No blastic or lytic bone lesions evident. No fracture or spondylolisthesis.  Pelvis: Equivocal lucencies in each femoral neck appear stable. No new or well-defined blastic or lytic bone lesions.  Right femur: Equivocal lucencies in the right femoral neck stable. No new lucency to suggest progression of disease. No abnormal periosteal reaction.  Left femur: Equivocal lucency in the femoral neck region stable. No evidence suggesting progression of disease. No bony destruction. No abnormal periosteal reaction. Lateral left hemiarthroplasty noted, stable.  Right tibia and fibula: No blastic or lytic bone lesions. No abnormal periosteal reaction.  Left tibia and fibula: No blastic or lytic bone lesions. No abnormal periosteal reaction.  Right shoulder and humerus: Equivocal lucency in the proximal right humerus and lateral right clavicle stable. No progression of potential lytic change. No abnormal periosteal reaction. No blastic lesions.  Left shoulder and humerus: Subtle lytic lesions in the proximal left humerus stable. No progression of lytic change. No abnormal periosteal reaction. No evident blastic lesion.  Right forearm: No blastic or lytic bone lesions. No abnormal periosteal reaction.  Left forearm: No blastic or lytic bone lesions. No abnormal periosteal reaction.  IMPRESSION: In comparison with prior study,  multiple small lucencies felt to represent changes of multiple myeloma in the skull appear stable. Somewhat subtle lesions in each proximal humerus and equivocal areas of lucency in each femoral neck and lateral clavicle are stable. No findings suggesting progression of multiple myeloma. No new or enlarging lesions evident.  Mild anterior wedging at T6, T7, and T8 is stable.  Lungs clear.  No thoracic adenopathy on chest  radiograph.  Electronically Signed   By: Lowella Grip III M.D.   On: 07/31/2020 09:19 Note: Reviewed        Physical Exam  General appearance: Well nourished, well developed, and well hydrated. In no apparent acute distress Mental status: Alert, oriented x 3 (person, place, & time)       Respiratory: No evidence of acute respiratory distress Eyes: PERLA Vitals: BP (!) 157/93   Pulse 68   Temp (!) 96.7 F (35.9 C)   Ht _0  (1.702 m)   Wt 160 lb (72.6 kg)   SpO2 100%   BMI 25.06 kg/m  BMI: Estimated body mass index is 25.06 kg/m as calculated from the following:   Height as of this encounter: _1  (1.702 m).   Weight as of this encounter: 160 lb (72.6 kg). Ideal: Ideal body weight: 66.1 kg (145 lb 11.6 oz) Adjusted ideal body weight: 68.7 kg (151 lb 7 oz)  Assessment   Status Diagnosis  Controlled Controlled Controlled 1. Chronic pain syndrome   2. Chronic elbow joint pain (Medial aspect) (Bilateral)   3. Epicondylitis elbow, medial (Left)   4. Epicondylitis elbow, medial (Right)   5. Chronic low back pain (Bilateral) (L>R) w/ sciatica (Bilateral)   6. Chronic lower extremity pain (2ry area of Pain) (Bilateral) (L>R)   7. Chronic lumbar radiculopathy (by EMG/PNCV) (L5) (Right)   8. Chronic knee pain (3ry area of Pain) (Bilateral) (L>R)   9. Pharmacologic therapy   10. Chronic use of opiate for therapeutic purpose   11. Encounter for medication management      Updated Problems: No problems updated.  Plan of Care  Problem-specific:  No problem-specific Assessment & Plan notes found for this encounter.  Mr. James House. has a current medication list which includes the following long-term medication(s): baclofen, pregabalin, sertraline, oxycodone, [START ON 01/03/2021] oxycodone, [START ON 02/02/2021] oxycodone, and [START ON 03/04/2021] oxycodone.  Pharmacotherapy (Medications Ordered): Meds ordered this encounter  Medications   oxyCODONE (OXY  IR/ROXICODONE) 5 MG immediate release tablet    Sig: Take 1 tablet (5 mg total) by mouth every 8 (eight) hours as needed for severe pain. Must last 30 days.    Dispense:  90 tablet    Refill:  0    Not a duplicate. Do NOT delete! Dispense 1 day early if closed on fill date. Warn not to take CNS-depressants 8 hours before or after taking opioid. Do not send refill request. Renewal requires appointment.   oxyCODONE (OXY IR/ROXICODONE) 5 MG immediate release tablet    Sig: Take 1 tablet (5 mg total) by mouth every 8 (eight) hours as needed for severe pain. Must last 30 days.    Dispense:  90 tablet    Refill:  0    Not a duplicate. Do NOT delete! Dispense 1 day early if closed on fill date. Warn not to take CNS-depressants 8 hours before or after taking opioid. Do not send refill request. Renewal requires appointment.   oxyCODONE (OXY IR/ROXICODONE) 5 MG immediate release tablet    Sig: Take 1  tablet (5 mg total) by mouth every 8 (eight) hours as needed for severe pain. Must last 30 days.    Dispense:  90 tablet    Refill:  0    Not a duplicate. Do NOT delete! Dispense 1 day early if closed on fill date. Warn not to take CNS-depressants 8 hours before or after taking opioid. Do not send refill request. Renewal requires appointment.   oxyCODONE (OXY IR/ROXICODONE) 5 MG immediate release tablet    Sig: Take 1 tablet (5 mg total) by mouth every 8 (eight) hours as needed for severe pain. Must last 30 days.    Dispense:  90 tablet    Refill:  0    Not a duplicate. Do NOT delete! Dispense 1 day early if closed on fill date. Warn not to take CNS-depressants 8 hours before or after taking opioid. Do not send refill request. Renewal requires appointment.    Orders:  Orders Placed This Encounter  Procedures   ToxASSURE Select 13 (MW), Urine    Volume: 30 ml(s). Minimum 3 ml of urine is needed. Document temperature of fresh sample. Indications: Long term (current) use of opiate analgesic (X90.240)     Order Specific Question:   Release to patient    Answer:   Immediate    Follow-up plan:   Return in about 4 months (around 04/03/2021) for (M,W) (F2F) (MM).     Interventional Therapies  Risk  Complexity Considerations:   Estimated body mass index is 24.28 kg/m as calculated from the following:   Height as of 08/29/20: _0  (1.702 m).   Weight as of 08/29/20: 155 lb (70.3 kg). WNL   Planned  Pending:   Pending further evaluation   Under consideration:   Diagnostic bilateral LESI  Possible bilateral lumbar facet RFA  Diagnostic bilateral SI block  Diagnostic right knee Hyalgan series  Diagnostic right knee IA knee injection  Diagnostic left knee genicular NB    Completed:   Therapeutic bilateral elbow medial epicondyle steroid injection x1 (08/29/2020)  Palliative bilateral IA hip joint injection x1 (05/26/2018) (50/50/50/50) Palliative bilateral lumbar facet block x2 (10/13/2018) (100/100/0) (60/60/60) Palliative left piriformis muscle injection x1  (07/09/2018) (100/100/0)   Therapeutic  Palliative (PRN) options:   Palliative bilateral IA hip joint injection #2  Palliative bilateral lumbar facet block #3  Palliative left piriformis muscle injection #2      Recent Visits Date Type Provider Dept  09/19/20 Telemedicine Milinda Pointer, MD [facility]  Showing recent visits within past 90 days and meeting all other requirements Today's Visits Date Type Provider Dept  12/04/20 Office Visit Milinda Pointer, MD [facility]  Showing today's visits and meeting all other requirements Future Appointments No visits were found meeting these conditions. Showing future appointments within next 90 days and meeting all other requirements I discussed the assessment and treatment plan with the patient. The patient was provided an opportunity to ask questions and all were answered. The patient agreed with the plan and demonstrated an understanding of the  instructions.  Patient advised to call back or seek an in-person evaluation if the symptoms or condition worsens.  Duration of encounter: 30 minutes.  Note by: Gaspar Cola, MD Date: 12/04/2020; Time: 9:10 AM

## 2020-12-07 ENCOUNTER — Other Ambulatory Visit: Payer: Self-pay | Admitting: *Deleted

## 2020-12-07 DIAGNOSIS — C9001 Multiple myeloma in remission: Secondary | ICD-10-CM

## 2020-12-07 LAB — TOXASSURE SELECT 13 (MW), URINE

## 2020-12-07 MED ORDER — LENALIDOMIDE 5 MG PO CAPS
5.0000 mg | ORAL_CAPSULE | Freq: Every day | ORAL | 0 refills | Status: DC
Start: 2020-12-07 — End: 2021-01-05

## 2020-12-18 ENCOUNTER — Other Ambulatory Visit (HOSPITAL_COMMUNITY): Payer: Self-pay

## 2020-12-19 ENCOUNTER — Other Ambulatory Visit (HOSPITAL_COMMUNITY): Payer: Self-pay

## 2020-12-19 ENCOUNTER — Other Ambulatory Visit: Payer: Self-pay | Admitting: Primary Care

## 2020-12-19 DIAGNOSIS — G620 Drug-induced polyneuropathy: Secondary | ICD-10-CM

## 2020-12-19 DIAGNOSIS — M792 Neuralgia and neuritis, unspecified: Secondary | ICD-10-CM

## 2020-12-20 ENCOUNTER — Other Ambulatory Visit (HOSPITAL_COMMUNITY): Payer: Self-pay

## 2020-12-20 MED ORDER — PREGABALIN 150 MG PO CAPS
150.0000 mg | ORAL_CAPSULE | Freq: Three times a day (TID) | ORAL | 0 refills | Status: DC
Start: 2020-12-20 — End: 2021-02-05
  Filled 2020-12-20 – 2020-12-26 (×2): qty 270, 90d supply, fill #0

## 2020-12-26 ENCOUNTER — Other Ambulatory Visit (HOSPITAL_COMMUNITY): Payer: Self-pay

## 2021-01-01 ENCOUNTER — Other Ambulatory Visit (HOSPITAL_COMMUNITY): Payer: Self-pay

## 2021-01-03 ENCOUNTER — Other Ambulatory Visit (HOSPITAL_COMMUNITY): Payer: Self-pay

## 2021-01-04 ENCOUNTER — Other Ambulatory Visit (HOSPITAL_COMMUNITY): Payer: Self-pay

## 2021-01-05 ENCOUNTER — Other Ambulatory Visit: Payer: Self-pay | Admitting: *Deleted

## 2021-01-05 DIAGNOSIS — C9001 Multiple myeloma in remission: Secondary | ICD-10-CM

## 2021-01-05 MED ORDER — LENALIDOMIDE 5 MG PO CAPS
5.0000 mg | ORAL_CAPSULE | Freq: Every day | ORAL | 0 refills | Status: DC
Start: 2021-01-05 — End: 2021-02-14

## 2021-01-23 ENCOUNTER — Other Ambulatory Visit (HOSPITAL_COMMUNITY): Payer: Self-pay

## 2021-01-30 ENCOUNTER — Inpatient Hospital Stay: Payer: No Typology Code available for payment source | Attending: Oncology | Admitting: Oncology

## 2021-01-30 ENCOUNTER — Inpatient Hospital Stay: Payer: No Typology Code available for payment source

## 2021-01-30 DIAGNOSIS — C9002 Multiple myeloma in relapse: Secondary | ICD-10-CM | POA: Insufficient documentation

## 2021-01-31 ENCOUNTER — Other Ambulatory Visit (HOSPITAL_COMMUNITY): Payer: Self-pay

## 2021-02-01 ENCOUNTER — Inpatient Hospital Stay: Payer: No Typology Code available for payment source

## 2021-02-01 ENCOUNTER — Other Ambulatory Visit: Payer: Self-pay

## 2021-02-01 DIAGNOSIS — Z79899 Other long term (current) drug therapy: Secondary | ICD-10-CM

## 2021-02-01 DIAGNOSIS — C9001 Multiple myeloma in remission: Secondary | ICD-10-CM

## 2021-02-01 DIAGNOSIS — C9002 Multiple myeloma in relapse: Secondary | ICD-10-CM | POA: Diagnosis present

## 2021-02-01 LAB — COMPREHENSIVE METABOLIC PANEL
ALT: 19 U/L (ref 0–44)
AST: 23 U/L (ref 15–41)
Albumin: 3.9 g/dL (ref 3.5–5.0)
Alkaline Phosphatase: 99 U/L (ref 38–126)
Anion gap: 7 (ref 5–15)
BUN: 21 mg/dL — ABNORMAL HIGH (ref 6–20)
CO2: 27 mmol/L (ref 22–32)
Calcium: 9 mg/dL (ref 8.9–10.3)
Chloride: 105 mmol/L (ref 98–111)
Creatinine, Ser: 1.51 mg/dL — ABNORMAL HIGH (ref 0.61–1.24)
GFR, Estimated: 56 mL/min — ABNORMAL LOW (ref >60)
Glucose, Bld: 114 mg/dL — ABNORMAL HIGH (ref 70–99)
Potassium: 3.7 mmol/L (ref 3.5–5.1)
Sodium: 139 mmol/L (ref 135–145)
Total Bilirubin: 0.8 mg/dL (ref 0.3–1.2)
Total Protein: 6.4 g/dL — ABNORMAL LOW (ref 6.5–8.1)

## 2021-02-01 LAB — CBC WITH DIFFERENTIAL/PLATELET
Abs Immature Granulocytes: 0.02 10*3/uL (ref 0.00–0.07)
Basophils Absolute: 0.1 10*3/uL (ref 0.0–0.1)
Basophils Relative: 1 %
Eosinophils Absolute: 0.1 10*3/uL (ref 0.0–0.5)
Eosinophils Relative: 2 %
HCT: 42.5 % (ref 39.0–52.0)
Hemoglobin: 14.9 g/dL (ref 13.0–17.0)
Immature Granulocytes: 0 %
Lymphocytes Relative: 15 %
Lymphs Abs: 0.7 10*3/uL (ref 0.7–4.0)
MCH: 33.3 pg (ref 26.0–34.0)
MCHC: 35.1 g/dL (ref 30.0–36.0)
MCV: 95.1 fL (ref 80.0–100.0)
Monocytes Absolute: 0.4 10*3/uL (ref 0.1–1.0)
Monocytes Relative: 8 %
Neutro Abs: 3.3 10*3/uL (ref 1.7–7.7)
Neutrophils Relative %: 74 %
Platelets: 165 10*3/uL (ref 150–400)
RBC: 4.47 MIL/uL (ref 4.22–5.81)
RDW: 12.9 % (ref 11.5–15.5)
WBC: 4.5 10*3/uL (ref 4.0–10.5)
nRBC: 0 % (ref 0.0–0.2)

## 2021-02-02 ENCOUNTER — Other Ambulatory Visit (HOSPITAL_COMMUNITY): Payer: Self-pay

## 2021-02-02 LAB — KAPPA/LAMBDA LIGHT CHAINS
Kappa free light chain: 19.4 mg/L (ref 3.3–19.4)
Kappa, lambda light chain ratio: 1.73 — ABNORMAL HIGH (ref 0.26–1.65)
Lambda free light chains: 11.2 mg/L (ref 5.7–26.3)

## 2021-02-05 ENCOUNTER — Other Ambulatory Visit: Payer: Self-pay | Admitting: Primary Care

## 2021-02-05 DIAGNOSIS — M792 Neuralgia and neuritis, unspecified: Secondary | ICD-10-CM

## 2021-02-05 DIAGNOSIS — G8929 Other chronic pain: Secondary | ICD-10-CM

## 2021-02-05 DIAGNOSIS — M62830 Muscle spasm of back: Secondary | ICD-10-CM

## 2021-02-05 DIAGNOSIS — T451X5A Adverse effect of antineoplastic and immunosuppressive drugs, initial encounter: Secondary | ICD-10-CM

## 2021-02-05 DIAGNOSIS — G620 Drug-induced polyneuropathy: Secondary | ICD-10-CM

## 2021-02-05 DIAGNOSIS — M7918 Myalgia, other site: Secondary | ICD-10-CM

## 2021-02-05 LAB — MULTIPLE MYELOMA PANEL, SERUM
Albumin SerPl Elph-Mcnc: 3.6 g/dL (ref 2.9–4.4)
Albumin/Glob SerPl: 1.7 (ref 0.7–1.7)
Alpha 1: 0.1 g/dL (ref 0.0–0.4)
Alpha2 Glob SerPl Elph-Mcnc: 0.5 g/dL (ref 0.4–1.0)
B-Globulin SerPl Elph-Mcnc: 0.9 g/dL (ref 0.7–1.3)
Gamma Glob SerPl Elph-Mcnc: 0.7 g/dL (ref 0.4–1.8)
Globulin, Total: 2.2 g/dL (ref 2.2–3.9)
IgA: 167 mg/dL (ref 90–386)
IgG (Immunoglobin G), Serum: 801 mg/dL (ref 603–1613)
IgM (Immunoglobulin M), Srm: 52 mg/dL (ref 20–172)
Total Protein ELP: 5.8 g/dL — ABNORMAL LOW (ref 6.0–8.5)

## 2021-02-06 ENCOUNTER — Other Ambulatory Visit (HOSPITAL_COMMUNITY): Payer: Self-pay

## 2021-02-06 ENCOUNTER — Encounter: Payer: Self-pay | Admitting: Oncology

## 2021-02-06 MED ORDER — PREGABALIN 150 MG PO CAPS
150.0000 mg | ORAL_CAPSULE | Freq: Three times a day (TID) | ORAL | 0 refills | Status: DC | PRN
  Filled 2021-02-06 – 2021-02-07 (×2): qty 90, 30d supply, fill #0

## 2021-02-06 MED ORDER — BACLOFEN 10 MG PO TABS
10.0000 mg | ORAL_TABLET | Freq: Four times a day (QID) | ORAL | 0 refills | Status: DC | PRN
  Filled 2021-02-06: qty 120, 30d supply, fill #0

## 2021-02-08 ENCOUNTER — Other Ambulatory Visit (HOSPITAL_COMMUNITY): Payer: Self-pay

## 2021-02-10 ENCOUNTER — Other Ambulatory Visit (HOSPITAL_COMMUNITY): Payer: Self-pay

## 2021-02-14 ENCOUNTER — Other Ambulatory Visit: Payer: Self-pay | Admitting: *Deleted

## 2021-02-14 DIAGNOSIS — C9001 Multiple myeloma in remission: Secondary | ICD-10-CM

## 2021-02-14 MED ORDER — LENALIDOMIDE 5 MG PO CAPS: 5.0000 mg | ORAL_CAPSULE | Freq: Every day | ORAL | 0 refills | Status: DC

## 2021-02-16 ENCOUNTER — Telehealth: Payer: Self-pay | Admitting: *Deleted

## 2021-02-16 NOTE — Telephone Encounter (Signed)
CAll from [place] due to patient answering his survey with no to the fact that did he know that Revlimid caused birth defects. I assured her that he had bee informed of the fact that it does cause birth defects and that he has been on this medicine for a while now. She cleared the flag so that the prescription can be filled. I attempted to call patient to make sure he is aware of this, but I had to leave message for him to return my call

## 2021-02-20 ENCOUNTER — Encounter: Payer: Self-pay | Admitting: Oncology

## 2021-02-20 ENCOUNTER — Other Ambulatory Visit (HOSPITAL_COMMUNITY): Payer: Self-pay

## 2021-02-20 NOTE — Telephone Encounter (Signed)
Patient replied back via MyChart. I responded to him there explaining the situation

## 2021-02-26 ENCOUNTER — Other Ambulatory Visit: Payer: Self-pay | Admitting: *Deleted

## 2021-02-26 DIAGNOSIS — C9001 Multiple myeloma in remission: Secondary | ICD-10-CM

## 2021-02-27 ENCOUNTER — Inpatient Hospital Stay: Payer: BC Managed Care – PPO | Attending: Oncology | Admitting: Oncology

## 2021-02-27 DIAGNOSIS — M25521 Pain in right elbow: Secondary | ICD-10-CM | POA: Insufficient documentation

## 2021-02-27 DIAGNOSIS — Z800 Family history of malignant neoplasm of digestive organs: Secondary | ICD-10-CM | POA: Insufficient documentation

## 2021-02-27 DIAGNOSIS — R5382 Chronic fatigue, unspecified: Secondary | ICD-10-CM | POA: Insufficient documentation

## 2021-02-27 DIAGNOSIS — M7700 Medial epicondylitis, unspecified elbow: Secondary | ICD-10-CM | POA: Insufficient documentation

## 2021-02-27 DIAGNOSIS — Z9484 Stem cells transplant status: Secondary | ICD-10-CM | POA: Insufficient documentation

## 2021-02-27 DIAGNOSIS — N189 Chronic kidney disease, unspecified: Secondary | ICD-10-CM | POA: Insufficient documentation

## 2021-02-27 DIAGNOSIS — T451X5A Adverse effect of antineoplastic and immunosuppressive drugs, initial encounter: Secondary | ICD-10-CM | POA: Insufficient documentation

## 2021-02-27 DIAGNOSIS — C9000 Multiple myeloma not having achieved remission: Secondary | ICD-10-CM | POA: Insufficient documentation

## 2021-02-27 DIAGNOSIS — M549 Dorsalgia, unspecified: Secondary | ICD-10-CM | POA: Insufficient documentation

## 2021-02-27 DIAGNOSIS — G8929 Other chronic pain: Secondary | ICD-10-CM | POA: Insufficient documentation

## 2021-02-27 DIAGNOSIS — G620 Drug-induced polyneuropathy: Secondary | ICD-10-CM | POA: Insufficient documentation

## 2021-02-27 DIAGNOSIS — Z79899 Other long term (current) drug therapy: Secondary | ICD-10-CM | POA: Insufficient documentation

## 2021-02-27 DIAGNOSIS — Z720 Tobacco use: Secondary | ICD-10-CM | POA: Insufficient documentation

## 2021-02-28 ENCOUNTER — Ambulatory Visit: Payer: No Typology Code available for payment source | Admitting: Oncology

## 2021-03-02 ENCOUNTER — Other Ambulatory Visit (HOSPITAL_COMMUNITY): Payer: Self-pay

## 2021-03-05 ENCOUNTER — Other Ambulatory Visit (HOSPITAL_COMMUNITY): Payer: Self-pay

## 2021-03-06 ENCOUNTER — Other Ambulatory Visit: Payer: Self-pay

## 2021-03-06 ENCOUNTER — Inpatient Hospital Stay (HOSPITAL_BASED_OUTPATIENT_CLINIC_OR_DEPARTMENT_OTHER): Payer: BC Managed Care – PPO | Admitting: Nurse Practitioner

## 2021-03-06 ENCOUNTER — Encounter: Payer: Self-pay | Admitting: Nurse Practitioner

## 2021-03-06 VITALS — BP 149/100 | HR 73 | Temp 96.2°F | Resp 18 | Wt 140.7 lb

## 2021-03-06 DIAGNOSIS — M549 Dorsalgia, unspecified: Secondary | ICD-10-CM | POA: Diagnosis not present

## 2021-03-06 DIAGNOSIS — M7701 Medial epicondylitis, right elbow: Secondary | ICD-10-CM

## 2021-03-06 DIAGNOSIS — N189 Chronic kidney disease, unspecified: Secondary | ICD-10-CM | POA: Diagnosis not present

## 2021-03-06 DIAGNOSIS — Z720 Tobacco use: Secondary | ICD-10-CM | POA: Diagnosis not present

## 2021-03-06 DIAGNOSIS — Z800 Family history of malignant neoplasm of digestive organs: Secondary | ICD-10-CM | POA: Diagnosis not present

## 2021-03-06 DIAGNOSIS — Z79899 Other long term (current) drug therapy: Secondary | ICD-10-CM | POA: Diagnosis not present

## 2021-03-06 DIAGNOSIS — C9001 Multiple myeloma in remission: Secondary | ICD-10-CM | POA: Diagnosis not present

## 2021-03-06 DIAGNOSIS — M7700 Medial epicondylitis, unspecified elbow: Secondary | ICD-10-CM | POA: Diagnosis not present

## 2021-03-06 DIAGNOSIS — C9000 Multiple myeloma not having achieved remission: Secondary | ICD-10-CM | POA: Diagnosis not present

## 2021-03-06 DIAGNOSIS — Z9484 Stem cells transplant status: Secondary | ICD-10-CM | POA: Diagnosis not present

## 2021-03-06 DIAGNOSIS — R5382 Chronic fatigue, unspecified: Secondary | ICD-10-CM | POA: Diagnosis not present

## 2021-03-06 DIAGNOSIS — M25521 Pain in right elbow: Secondary | ICD-10-CM | POA: Diagnosis not present

## 2021-03-06 DIAGNOSIS — G620 Drug-induced polyneuropathy: Secondary | ICD-10-CM | POA: Diagnosis not present

## 2021-03-06 DIAGNOSIS — T451X5A Adverse effect of antineoplastic and immunosuppressive drugs, initial encounter: Secondary | ICD-10-CM | POA: Diagnosis not present

## 2021-03-06 DIAGNOSIS — G8929 Other chronic pain: Secondary | ICD-10-CM | POA: Diagnosis not present

## 2021-03-06 MED ORDER — PREDNISONE 10 MG PO TABS
10.0000 mg | ORAL_TABLET | Freq: Every morning | ORAL | 0 refills | Status: DC
  Filled 2021-03-06: qty 10, 10d supply, fill #0

## 2021-03-06 NOTE — Progress Notes (Signed)
Hematology/Oncology Consult note [facility]  [phone] [phone]  Patient Care Team: Pleas Koch, NP as PCP - General (Internal Medicine) Vella Redhead, MD as PCP - Hematology/Oncology Sindy Guadeloupe, MD as Consulting Physician (Hematology and Oncology)   Name of the patient: James House  [id number]  1970-09-22   Date of visit: 03/06/21  Diagnosis- light chain kappa multiple myeloma s/p ASCT currently in remission. Patient on maintenance revlimid   Chief complaint/ Reason for visit-routine follow-up of multiple myeloma  Heme/Onc history: Patient is a 50 year old male with a history of kappa light chain myeloma that was treated by [name].  History is as follows:   1.  Patient presented with renal insufficiency with a creatinine of 2.4 in April 2018.  Kidney biopsy showed myeloma cast nephropathy.  Kappa light chain was 3004 and lambda 0.22 with a kappa lambda ratio of 13,000 655.  Skeletal survey showed multiple calvarial and marrow lesions.  Bone marrow biopsy in May 2018 showed 43% plasma cells.  He received CyBorD for cycle 1 in May 2018 and was subsequently switched to RVD.  He received 4 cycles followed by a repeat bone marrow biopsy in August 2018 which showed no increase in plasma cells.   2.  He underwent autologous stem cell transplantation on 01/30/2017.  He was started on Revlimid maintenance 10 mg daily in January 2019.   3.  Labs from January February and March 2019 done at [facility] showed no M spike, normal kappa lambda ratio of 1.06.  He was last seen by them in April 2019.   Following that patient was admitted to [facility] for healthcare associated pneumonia and was recently discharged.  He wished to transfer his care to [place] at this time.   With regards to his myeloma patient is currently on 10 mg of Revlimid daily.  He is also on acyclovir 400 mg twice daily which he is to continue for a minimum of 1 year post transplant.   Patient has chronic back pain as well as chemo-induced peripheral neuropathy for which he was seen by [name] in the past and is currently seeing [facility] pain clinic and is under pain contract with them  Interval history-patient is 50 year old male who returns to clinic for labs, further evaluation.  He continues on maintenance Revlimid for above history of multiple myeloma.  He has ongoing chronic fatigue which is stable and unchanged.  No new bone pain.  Has chronic neuropathic pain and back pain for which she is seen by pain clinic. Reports new right elbow pain, tender to the touch. Pain when flexing his wrist. Pain wakes him from sleep and he hasn't been able to work for 2 weeks. 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 Reports weakened grip in hand. Pain worse when bending wrist down. Improves with straightening arm. He's unable to take NSAIDs d/t kidney function.  He has been off Revlimid for 2 or 3 weeks due to running out of medication.  ECOG PS- 1 Pain scale- 6   Review of systems- Review of Systems  Constitutional:  Positive for malaise/fatigue. Negative for chills, fever and weight loss.  HENT:  Negative for congestion, ear discharge and nosebleeds.   Eyes:  Negative for blurred vision.  Respiratory:  Negative for cough, hemoptysis, sputum production, shortness of breath and wheezing.   Cardiovascular:  Negative for chest pain, palpitations, orthopnea and claudication.  Gastrointestinal:  Negative for abdominal pain, blood in stool,  constipation, diarrhea, heartburn, melena, nausea and vomiting.  Genitourinary:  Negative for dysuria, flank pain, frequency, hematuria and urgency.  Musculoskeletal:  Positive for back pain and joint pain. Negative for myalgias.  Skin:  Negative for rash.  Neurological:  Positive for sensory change (Peripheral neuropathy). Negative for dizziness, tingling, focal weakness, seizures, weakness and headaches.  Endo/Heme/Allergies:  Does not  bruise/bleed easily.  Psychiatric/Behavioral:  Negative for depression and suicidal ideas. The patient does not have insomnia.      No Known Allergies   Past Medical History:  Diagnosis Date   CKD (chronic kidney disease) stage 3, GFR 30-59 ml/min (HCC)    Hypertension    Multiple myeloma ([place]) 08/11/2017   Neuropathy    Pneumonia     Past Surgical History:  Procedure Laterality Date   ABDOMINAL SURGERY     JOINT REPLACEMENT     right knee   KNEE SURGERY Left     Social History   Socioeconomic History   Marital status: Legally Separated    Spouse name: [name]   Number of children: 3   Years of education: Not on file   Highest education level: Not on file  Occupational History   Not on file  Tobacco Use   Smoking status: Former   Smokeless tobacco: Current    Types: Snuff  Vaping Use   Vaping Use: Never used  Substance and Sexual Activity   Alcohol use: No   Drug use: No   Sexual activity: Yes  Other Topics Concern   Not on file  Social History Narrative   Live in private residence with spouse and mother in law   Social Determinants of Health   Financial Resource Strain: Not on file  Food Insecurity: Not on file  Transportation Needs: Not on file  Physical Activity: Not on file  Stress: Not on file  Social Connections: Not on file  Intimate Partner Violence: Not on file    Family History  Problem Relation Age of Onset   Cancer Mother 77       Pancreatic   COPD Mother    Diabetes Mother    Hyperlipidemia Mother    Hypertension Mother    Cancer Maternal Uncle        pancreatic   Cancer Maternal Grandmother 54       colon   COPD Maternal Grandmother    Diabetes Maternal Grandmother    Hypertension Maternal Grandmother      Current Outpatient Medications:    aspirin EC 81 MG tablet, Take 81 mg by mouth daily., Disp: , Rfl:    baclofen (LIORESAL) 10 MG tablet, Take 1 tablet (10 mg total) by mouth 4 (four) times daily as needed for muscle  spasms. Office visit required for further refills., Disp: 120 tablet, Rfl: 0   lenalidomide (REVLIMID) 5 MG capsule, Take 1 capsule (5 mg total) by mouth daily. [employer] Auth #  [id number]        done on 02/14/2021, Disp: 28 capsule, Rfl: 0   oxyCODONE (OXY IR/ROXICODONE) 5 MG immediate release tablet, Take 1 tablet (5 mg total) by mouth every 8 (eight) hours as needed for severe pain. Must last 30 days., Disp: 90 tablet, Rfl: 0   oxyCODONE (OXY IR/ROXICODONE) 5 MG immediate release tablet, Take 1 tablet (5 mg total) by mouth every 8 (eight) hours as needed for severe pain. Must last 30 days., Disp: 90 tablet, Rfl: 0   oxyCODONE (OXY IR/ROXICODONE) 5 MG immediate release tablet, Take 1  tablet by mouth every 8 hours as needed for severe pain. Must last 30 days., Disp: 90 tablet, Rfl: 0   oxyCODONE (OXY IR/ROXICODONE) 5 MG immediate release tablet, Take 1 tablet (5 mg total) by mouth every 8 (eight) hours as needed for severe pain. Must last 30 days., Disp: 90 tablet, Rfl: 0   pregabalin (LYRICA) 150 MG capsule, Take 1 capsule (150 mg total) by mouth 3 (three) times daily as needed for pain. **Office visit required for further refills.**, Disp: 90 capsule, Rfl: 0   sertraline (ZOLOFT) 25 MG tablet, TAKE 1 TABLET BY MOUTH ONCE A DAY, Disp: 30 tablet, Rfl: 3  Physical exam:  Vitals:   03/06/21 0939  BP: (!) 149/100  Pulse: 73  Resp: 18  Temp: (!) 96.2 F (35.7 C)  TempSrc: Tympanic  SpO2: 100%  Weight: 140 lb 11.2 oz (63.8 kg)    Physical Exam Constitutional:      General: He is not in acute distress. Cardiovascular:     Rate and Rhythm: Normal rate and regular rhythm.     Heart sounds: Normal heart sounds.  Pulmonary:     Effort: Pulmonary effort is normal.  Musculoskeletal:     Right elbow: Decreased range of motion. Tenderness present in medial epicondyle. No lateral epicondyle tenderness.     Comments: ROM limited in right wrist d/t pain of right medial elbow.   Skin:    General: Skin  is warm and dry.  Neurological:     Mental Status: He is alert and oriented to person, place, and time.  Psychiatric:        Mood and Affect: Mood normal.        Behavior: Behavior normal.     CMP Latest Ref Rng & Units 02/01/2021  Glucose 70 - 99 mg/dL 114(H)  BUN 6 - 20 mg/dL 21(H)  Creatinine 0.61 - 1.24 mg/dL 1.51(H)  Sodium 135 - 145 mmol/L 139  Potassium 3.5 - 5.1 mmol/L 3.7  Chloride 98 - 111 mmol/L 105  CO2 22 - 32 mmol/L 27  Calcium 8.9 - 10.3 mg/dL 9.0  Total Protein 6.5 - 8.1 g/dL 6.4(L)  Total Bilirubin 0.3 - 1.2 mg/dL 0.8  Alkaline Phos 38 - 126 U/L 99  AST 15 - 41 U/L 23  ALT 0 - 44 U/L 19   CBC Latest Ref Rng & Units 02/01/2021  WBC 4.0 - 10.5 K/uL 4.5  Hemoglobin 13.0 - 17.0 g/dL 14.9  Hematocrit 39.0 - 52.0 % 42.5  Platelets 150 - 400 K/uL 165    No images are attached to the encounter.  No results found.    Assessment and plan- Patient is a 50 y.o. male with free light chain kappa multiple myeloma s/p autologous transplantation currently in remission, on maintenance Revlimid, here for routine follow-up.  He is run out of Revlimid and will meet with Dennison Nancy, oral pharmacy, to see what programs he qualifies for and assist in getting him medication.  Clinically, he remains stable and asymptomatic.  Serum kappa free light chains continue to be within normal limits.  No anemia.  Calcium is normal.  He has baseline CKD which is stable with creatinine between 1.4 and 1.5.  No M protein on SPEP.  Recommend he continue low-dose maintenance Revlimid 5 mg daily.  Tolerating well without significant side effects.  He was previously on Xgeva for over 3 years which is now on hold.  Bone survey from April 2022 was independently reviewed and reveals stable lucency  in the skull and subtle lesions in the proximal humerus and equivocal areas in each femoral neck and lateral clavicle.  No findings suggestive of progressive multiple myeloma or new or enlarging lesions.   Continue to hold Xgeva.  Chemo-induced peripheral neuropathy: Follows up with [facility] and he is on Lyrica as needed oxycodone.  Medial epicondylitis-likely related to repetitive stress and movement.  Reviewed treatment including activity modifications, counterforce bracing or compression, oral analgesics such as acetaminophen.  Hold oral NSAIDs due to CKD.  Physical therapy, ice.  We will also give short course of steroids, prednisone 10 mg daily.  If symptoms persist, recommend evaluation with Ortho.  April 2023- DG Bone survey MET 6 months- SPEP, kappa lambda light chains, cbc, cmp, see Dr Janese Banks - [place]   Visit Diagnosis 1. Encounter for long-term (current) use of high-risk medication   2. Multiple myeloma in remission ([place])    Beckey Rutter, [place], AGNP-C [place] at [facility] [phone] (clinic) 03/06/2021

## 2021-03-06 NOTE — Progress Notes (Signed)
Patient has multiple body aches from his MM. He goes to the pain clinic. His elbows have been hurting really bad. He has been unable to work the last 2 weeks due to the body aches. Appetite is low. Energy is low. BP has been running high last few weeks, not on any meds for hypertension. States he at times feels dizzy. States he get special funding for his revlimid and needs MD to send a new prescription. He s currently not taking any revlimid.

## 2021-03-19 ENCOUNTER — Other Ambulatory Visit: Payer: Self-pay

## 2021-03-19 ENCOUNTER — Other Ambulatory Visit: Payer: Self-pay | Admitting: Oncology

## 2021-03-19 MED ORDER — SERTRALINE HCL 25 MG PO TABS
ORAL_TABLET | Freq: Every day | ORAL | 2 refills | Status: DC
  Filled 2021-03-19: qty 30, 30d supply, fill #0
  Filled 2021-05-01: qty 30, 30d supply, fill #1

## 2021-03-21 ENCOUNTER — Telehealth: Payer: Self-pay | Admitting: Pharmacy Technician

## 2021-03-26 NOTE — Telephone Encounter (Signed)
[name]  Met patient in room to complete application for BMSPAF in an effort to reduce patient's out of pocket expense for Revlimid to $0.    Application completed and faxed on 03/21/21 to [phone].   [place] Patient [employer] phone number for follow up is [phone].   This encounter will be updated until final determination.   [facility] Patient [place] Phone [phone] Fax [phone] 03/26/2021 9:55 AM

## 2021-03-26 NOTE — Telephone Encounter (Signed)
[name]  Received notification from [place] Patient [facility] that patient has been successfully enrolled into their program to receive Revlimid from the manufacturer at $0 out of pocket until 03/21/22.    I will call the patient to let him know of the approval and that he will need re-apply next year.  Specialty Pharmacy that will dispense medication is [facility].  Patient knows to call the office with questions or concerns.   Oral Oncology Clinic will continue to follow.  [place] Patient [place] Phone [phone] Fax [phone] 03/26/2021 9:56 AM

## 2021-03-27 NOTE — Progress Notes (Signed)
PROVIDER NOTE: Information contained herein reflects review and annotations entered in association with encounter. Interpretation of such information and data should be left to medically-trained personnel. Information provided to patient can be located elsewhere in the medical record under "Patient Instructions". Document created using STT-dictation technology, any transcriptional errors that may result from process are unintentional.    Patient: James House.  Service Category: E/M  Provider: Gaspar Cola, MD  DOB: 11-Feb-1971  DOS: 03/28/2021  Specialty: Interventional Pain Management  MRN: [id number]  Setting: Ambulatory outpatient  PCP: Pleas Koch, NP  Type: Established Patient    Referring Provider: Pleas Koch, NP  Location: Office  Delivery: Face-to-face     HPI  Mr. James House., a 50 y.o. year old male, is here today because of his Chronic pain syndrome [G89.4]. Mr. Petron primary complain today is Back Pain (Lumbar bilateral ) and Elbow Pain (Bilateral.  Difficulty straightening out his arm.  Oncologist tried steroids but they did not help. ) Last encounter: My last encounter with him was on 12/04/2020. Pertinent problems: Mr. Neilan has Multiple myeloma ([place]); Chronic low back pain (1ry area of Pain) (Bilateral) (L>R) w/ sciatica (Bilateral); Chronic lower extremity pain (2ry area of Pain) (Bilateral) (L>R); Chronic knee pain (3ry area of Pain) (Bilateral) (L>R); Chronic pain syndrome; Chemotherapy-induced peripheral neuropathy ([place]); Neurogenic pain; Neuropathic pain; History of Partial knee replacement (Left); Chronic knee pain s/p partial replacement (Left); Osteoarthritis of knee (Right); Chronic hip pain (Bilateral) (R>L); Chronic sacroiliac joint pain (Bilateral) (R>L); Other specified dorsopathies, sacral and sacrococcygeal region; Lumbar facet syndrome (Bilateral) (L>R); Spondylosis without myelopathy or radiculopathy, lumbar region; Chronic lumbar radiculopathy  (by EMG/PNCV) (L5) (Right); Chronic low back pain (Bilateral) (L>R) w/o sciatica; Spasm of back muscles; Chronic musculoskeletal pain; Osteoarthritis of hips (Bilateral); Piriformis syndrome (Left); Piriformis muscle pain (Left); Spasm of piriformis muscle (Left); DDD (degenerative disc disease), lumbosacral; Shingles; Thoracic radiculitis (Right); DDD (degenerative disc disease), thoracic; Non-traumatic compression fracture of T7 thoracic vertebra, sequela; Olecranon bursitis (Bilateral); Chronic elbow joint pain (Medial aspect) (Bilateral); Epicondylitis elbow, medial (Left); Epicondylitis elbow, medial (Right); Medial epicondylitis of elbows (Bilateral); Ulnar collateral ligament sprain of right elbow, sequela; Ulnar collateral ligament sprain of left elbow, sequela; Golfer's elbow (Left); and Golfer's elbow (Right) on their pertinent problem list. Pain Assessment: Severity of Chronic pain is reported as a 8 /10. Location:  (see visit info for other pain.) Lower, Left, Right/down both legs to the feet.. Onset: More than a month ago. Quality: Discomfort, Constant, Stabbing, Tingling. Timing: Constant. Modifying factor(s): nothing currently.. Vitals:  height is '5\' 7"'  (1.702 m) and weight is 140 lb (63.5 kg). His temporal temperature is 97 F (36.1 C) (abnormal). His blood pressure is 149/101 (abnormal) and his pulse is 84. His respiration is 16 and oxygen saturation is 100%.   Reason for encounter: medication management.   The patient indicates doing well with the current medication regimen. No adverse reactions or side effects reported to the medications.  The patient indicates continued to have bilateral elbow pain.  He was recently given option for oral steroids by oncology, but this did not help his elbow pain.  Today I will be ordering a compounding pharmacy cream for both elbows to see if this can provide him with some relief of the elbow pain.  Patient also provided with information regarding "Drug  Holiday" since he refers that the medications do not seem to be as effective as they were in the past.  RTCB:  07/04/2021 Nonopioids transfer 01/31/2020: Baclofen and Lyrica  Pharmacotherapy Assessment  Analgesic: Oxycodone IR 5 mg, 1 tab PO BID (10 mg/day of oxycodone) MME/day: 15 mg/day.   Monitoring: [place] PMP: PDMP reviewed during this encounter.       Pharmacotherapy: No side-effects or adverse reactions reported. Compliance: No problems identified. Effectiveness: Clinically acceptable.  Janett Billow, RN  03/28/2021  9:12 AM  Sign when Signing Visit Nursing Pain Medication Assessment:  Safety precautions to be maintained throughout the outpatient stay will include: orient to surroundings, keep bed in low position, maintain call bell within reach at all times, provide assistance with transfer out of bed and ambulation.  Medication Inspection Compliance: Pill count conducted under aseptic conditions, in front of the patient. Neither the pills nor the bottle was removed from the patient's sight at any time. Once count was completed pills were immediately returned to the patient in their original bottle.  Medication: Oxycodone IR Pill/Patch Count:  15 of 90 pills remain Pill/Patch Appearance: Markings consistent with prescribed medication Bottle Appearance: Standard pharmacy container. Clearly labeled. Filled Date: 7 / 14 / 2022 Last Medication intake:  Today    UDS:  Summary  Date Value Ref Range Status  12/04/2020 Note  Final    Comment:    ==================================================================== [employer] Select 13 (MW) ==================================================================== Test                             Result       Flag       Units  Drug Present not Declared for Prescription Verification   Carboxy-THC                    44           UNEXPECTED ng/mg creat    Carboxy-THC is a metabolite of tetrahydrocannabinol (THC). Source of    THC is most  commonly herbal marijuana or marijuana-based products,    but THC is also present in a scheduled prescription medication.    Trace amounts of THC can be present in hemp and cannabidiol (CBD)    products. This test is not intended to distinguish between delta-9-    tetrahydrocannabinol, the predominant form of THC in most herbal or    marijuana-based products, and delta-8-tetrahydrocannabinol.  Drug Absent but Declared for Prescription Verification   Oxycodone                      Not Detected UNEXPECTED ng/mg creat ==================================================================== Test                      Result    Flag   Units      Ref Range   Creatinine              148              mg/dL      >=20 ==================================================================== Declared Medications:  The flagging and interpretation on this report are based on the  following declared medications.  Unexpected results may arise from  inaccuracies in the declared medications.   **Note: The testing scope of this panel includes these medications:   Oxycodone (Roxicodone)   **Note: The testing scope of this panel does not include the  following reported medications:   Aspirin  Baclofen (Lioresal)  Lenalidomide (Revlimid)  Pregabalin (Lyrica)  Sertraline (Zoloft) ==================================================================== For clinical consultation, please call [phone]. ====================================================================  ROS  Constitutional: Denies any fever or chills Gastrointestinal: No reported hemesis, hematochezia, vomiting, or acute GI distress Musculoskeletal: Denies any acute onset joint swelling, redness, loss of ROM, or weakness Neurological: No reported episodes of acute onset apraxia, aphasia, dysarthria, agnosia, amnesia, paralysis, loss of coordination, or loss of consciousness  Medication Review  NONFORMULARY OR COMPOUNDED ITEM, aspirin  EC, baclofen, oxyCODONE, pregabalin, and sertraline  History Review  Allergy: Mr. Masoud has No Known Allergies. Drug: Mr. Vandenberghe  reports no history of drug use. Alcohol:  reports no history of alcohol use. Tobacco:  reports that he has quit smoking. His smokeless tobacco use includes snuff. Social: Mr. Rodin  reports that he has quit smoking. His smokeless tobacco use includes snuff. He reports that he does not drink alcohol and does not use drugs. Medical:  has a past medical history of CKD (chronic kidney disease) stage 3, GFR 30-59 ml/min ([place]), Hypertension, Multiple myeloma ([place]) (08/11/2017), Neuropathy, and Pneumonia. Surgical: Mr. Santillo  has a past surgical history that includes Joint replacement; Knee surgery (Left); and Abdominal surgery. Family: family history includes COPD in his maternal grandmother and mother; Cancer in his maternal uncle; Cancer (age of onset: 64) in his maternal grandmother; Cancer (age of onset: 37) in his mother; Diabetes in his maternal grandmother and mother; Hyperlipidemia in his mother; Hypertension in his maternal grandmother and mother.  Laboratory Chemistry Profile   Renal Lab Results  Component Value Date   BUN 21 (H) 02/01/2021   CREATININE 1.51 (H) 02/01/2021   BCR 7 (L) 11/26/2017   GFRAA >60 12/31/2019   GFRNONAA 56 (L) 02/01/2021    Hepatic Lab Results  Component Value Date   AST 23 02/01/2021   ALT 19 02/01/2021   ALBUMIN 3.9 02/01/2021   ALKPHOS 99 02/01/2021   LIPASE 37 05/23/2020    Electrolytes Lab Results  Component Value Date   NA 139 02/01/2021   K 3.7 02/01/2021   CL 105 02/01/2021   CALCIUM 9.0 02/01/2021   MG 2.4 (H) 11/26/2017    Bone Lab Results  Component Value Date   25OHVITD1 41 11/26/2017   25OHVITD2 4.4 11/26/2017   25OHVITD3 37 11/26/2017   TESTOSTERONE 570 12/29/2017    Inflammation (CRP: Acute Phase) (ESR: Chronic Phase) Lab Results  Component Value Date   CRP 9 11/26/2017   ESRSEDRATE 16 (H)  11/26/2017   LATICACIDVEN 1.1 08/11/2017         Note: Above Lab results reviewed.  Recent Imaging Review  DG Bone Survey Met CLINICAL DATA:  Multiple myeloma  EXAM: METASTATIC BONE SURVEY  COMPARISON:  October 22, 2018  FINDINGS: Skull: Multiple small lucencies throughout the calvarium appear stable, likely due to multiple myeloma. No progression of lytic change. Sella appears normal.  Cervical spine: No appreciable blastic or lytic bone lesions.  Thoracic spine: No blastic or lytic bone lesions. Slight anterior wedging at T6, see T7, and T8 again noted without progression.  Chest: Lungs are clear. Heart size and pulmonary vascular normal. No adenopathy. Old healed fracture lateral right seventh rib equivocal small lucency in the lateral right clavicle is stable. No new lytic appearing lesions. No blastic lesions.  Lumbar spine: No blastic or lytic bone lesions evident. No fracture or spondylolisthesis.  Pelvis: Equivocal lucencies in each femoral neck appear stable. No new or well-defined blastic or lytic bone lesions.  Right femur: Equivocal lucencies in the right femoral neck stable. No new lucency to suggest progression of disease. No abnormal periosteal reaction.  Left femur: Equivocal lucency in the femoral neck region stable. No evidence suggesting progression of disease. No bony destruction. No abnormal periosteal reaction. Lateral left hemiarthroplasty noted, stable.  Right tibia and fibula: No blastic or lytic bone lesions. No abnormal periosteal reaction.  Left tibia and fibula: No blastic or lytic bone lesions. No abnormal periosteal reaction.  Right shoulder and humerus: Equivocal lucency in the proximal right humerus and lateral right clavicle stable. No progression of potential lytic change. No abnormal periosteal reaction. No blastic lesions.  Left shoulder and humerus: Subtle lytic lesions in the proximal left humerus stable. No progression of  lytic change. No abnormal periosteal reaction. No evident blastic lesion.  Right forearm: No blastic or lytic bone lesions. No abnormal periosteal reaction.  Left forearm: No blastic or lytic bone lesions. No abnormal periosteal reaction.  IMPRESSION: In comparison with prior study, multiple small lucencies felt to represent changes of multiple myeloma in the skull appear stable. Somewhat subtle lesions in each proximal humerus and equivocal areas of lucency in each femoral neck and lateral clavicle are stable. No findings suggesting progression of multiple myeloma. No new or enlarging lesions evident.  Mild anterior wedging at T6, T7, and T8 is stable.  Lungs clear.  No thoracic adenopathy on chest radiograph.  Electronically Signed   By: Lowella Grip III M.D.   On: 07/31/2020 09:19 Note: Reviewed        Physical Exam  General appearance: Well nourished, well developed, and well hydrated. In no apparent acute distress Mental status: Alert, oriented x 3 (person, place, & time)       Respiratory: No evidence of acute respiratory distress Eyes: PERLA Vitals: BP (!) 149/101 (BP Location: Right Arm, Patient Position: Sitting, Cuff Size: Normal)   Pulse 84   Temp (!) 97 F (36.1 C) (Temporal)   Resp 16   Ht '5\' 7"'  (1.702 m)   Wt 140 lb (63.5 kg)   SpO2 100%   BMI 21.93 kg/m  BMI: Estimated body mass index is 21.93 kg/m as calculated from the following:   Height as of this encounter: '5\' 7"'  (1.702 m).   Weight as of this encounter: 140 lb (63.5 kg). Ideal: Ideal body weight: 66.1 kg (145 lb 11.6 oz)  Assessment   Status Diagnosis  Controlled Controlled Controlled 1. Chronic pain syndrome   2. Chronic low back pain (1ry area of Pain) (Bilateral) (L>R) w/ sciatica (Bilateral)   3. Lumbar facet syndrome (Bilateral) (L>R)   4. Chronic lower extremity pain (2ry area of Pain) (Bilateral) (L>R)   5. Chronic lumbar radiculopathy (by EMG/PNCV) (L5) (Right)   6. Chronic  knee pain (3ry area of Pain) (Bilateral) (L>R)   7. Chronic hip pain (Bilateral) (R>L)   8. Chronic elbow joint pain (Medial aspect) (Bilateral)   9. Medial epicondylitis of elbows (Bilateral)   10. Olecranon bursitis (Bilateral)   11. Ulnar collateral ligament sprain of left elbow, sequela   12. Ulnar collateral ligament sprain of right elbow, sequela   13. Multiple myeloma, remission status unspecified ([place])   14. Pharmacologic therapy   15. Chronic use of opiate for therapeutic purpose   16. Encounter for medication management   17. Abnormal drug screen (12/04/2020)   18. Marijuana use      Updated Problems: Problem  Ulnar Collateral Ligament Sprain of Left Elbow, Sequela  Medial epicondylitis of elbows (Bilateral)  Ulnar Collateral Ligament Sprain of Right Elbow, Sequela  Olecranon bursitis (Bilateral)  Chronic low back pain (Bilateral) (L>R)  w/o sciatica  Chronic low back pain (1ry area of Pain) (Bilateral) (L>R) w/ sciatica (Bilateral)  Abnormal drug screen (12/04/2020)   ABNORMAL (12/04/2020) UDS (+) Carboxy-THC (Marijuana)   Marijuana Use   ABNORMAL (12/04/2020) UDS (+) Carboxy-THC (Marijuana)   Pruritus  Acute Diarrhea  Preventative Health Care    Plan of Care  Problem-specific:  No problem-specific Assessment & Plan notes found for this encounter.  Mr. Sayed Apostol. has a current medication list which includes the following long-term medication(s): baclofen, [START ON 04/05/2021] oxycodone, [START ON 05/05/2021] oxycodone, [START ON 06/04/2021] oxycodone, pregabalin, and sertraline.  Pharmacotherapy (Medications Ordered): Meds ordered this encounter  Medications   oxyCODONE (OXY IR/ROXICODONE) 5 MG immediate release tablet    Sig: Take 1 tablet (5 mg total) by mouth every 8 (eight) hours as needed for severe pain. Must last 30 days.    Dispense:  90 tablet    Refill:  0    DO NOT: delete (not duplicate); no partial-fill (will deny script to complete), no refill  request (F/U required). DISPENSE: 1 day early if closed on fill date. WARN: No CNS-depressants within 8 hrs of med.   oxyCODONE (OXY IR/ROXICODONE) 5 MG immediate release tablet    Sig: Take 1 tablet (5 mg total) by mouth every 8 (eight) hours as needed for severe pain. Must last 30 days.    Dispense:  90 tablet    Refill:  0    DO NOT: delete (not duplicate); no partial-fill (will deny script to complete), no refill request (F/U required). DISPENSE: 1 day early if closed on fill date. WARN: No CNS-depressants within 8 hrs of med.   oxyCODONE (OXY IR/ROXICODONE) 5 MG immediate release tablet    Sig: Take 1 tablet (5 mg total) by mouth every 8 (eight) hours as needed for severe pain. Must last 30 days.    Dispense:  90 tablet    Refill:  0    DO NOT: delete (not duplicate); no partial-fill (will deny script to complete), no refill request (F/U required). DISPENSE: 1 day early if closed on fill date. WARN: No CNS-depressants within 8 hrs of med.   NONFORMULARY OR COMPOUNDED ITEM    Sig: Sig: Apply 1-2 gm(s) (2-4 pumps) to affected area, 3-4 times/day. (1 pump = 0.5 gm)    Dispense:  1 each    Refill:  2    Compounded cream: 2.5% Lidocaine, 10% Ketamine, 10% Ketoprofen, 6% Gabapentin  Dispense: 120 gm Pump Bottle. (Dispenser: 1 pump = 0.5 gm.)    Orders:  No orders of the defined types were placed in this encounter.  Follow-up plan:   Return in about 14 weeks (around 07/04/2021) for Eval-day (M,W), (F2F), (MM).     Interventional Therapies  Risk  Complexity Considerations:   Estimated body mass index is 21.93 kg/m as calculated from the following:   Height as of this encounter: '5\' 7"'  (1.702 m).   Weight as of this encounter: 140 lb (63.5 kg). WNL   Planned  Pending:      Under consideration:   Diagnostic bilateral LESI  Possible bilateral lumbar facet RFA  Diagnostic bilateral SI block  Diagnostic right knee Hyalgan series  Diagnostic right knee IA knee injection  Diagnostic  left knee genicular NB    Completed:   Therapeutic bilateral elbow medial epicondyle steroid injection x1 (08/29/2020) (100/100/L:30R:0) Palliative bilateral IA hip joint injection x1 (05/26/2018) (50/50/50/50) Palliative bilateral lumbar facet MBB x2 (10/13/2018) (100/100/0) (60/60/60) Palliative left piriformis muscle  injection x1  (07/09/2018) (100/100/0)   Therapeutic  Palliative (PRN) options:   Palliative bilateral IA hip joint injection #2  Palliative bilateral lumbar facet MBB #3  Palliative left piriformis muscle injection #2     Recent Visits No visits were found meeting these conditions. Showing recent visits within past 90 days and meeting all other requirements Today's Visits Date Type Provider Dept  03/28/21 Office Visit Milinda Pointer, MD [facility]  Showing today's visits and meeting all other requirements Future Appointments Date Type Provider Dept  06/20/21 Appointment Milinda Pointer, MD [facility]  Showing future appointments within next 90 days and meeting all other requirements I discussed the assessment and treatment plan with the patient. The patient was provided an opportunity to ask questions and all were answered. The patient agreed with the plan and demonstrated an understanding of the instructions.  Patient advised to call back or seek an in-person evaluation if the symptoms or condition worsens.  Duration of encounter: 30 minutes.  Note by: Gaspar Cola, MD Date: 03/28/2021; Time: 9:23 AM

## 2021-03-28 ENCOUNTER — Other Ambulatory Visit: Payer: Self-pay

## 2021-03-28 ENCOUNTER — Encounter: Payer: Self-pay | Admitting: Pain Medicine

## 2021-03-28 ENCOUNTER — Ambulatory Visit: Payer: No Typology Code available for payment source | Attending: Pain Medicine | Admitting: Pain Medicine

## 2021-03-28 VITALS — BP 149/101 | HR 84 | Temp 97.0°F | Resp 16 | Ht 67.0 in | Wt 140.0 lb

## 2021-03-28 DIAGNOSIS — M47816 Spondylosis without myelopathy or radiculopathy, lumbar region: Secondary | ICD-10-CM | POA: Diagnosis present

## 2021-03-28 DIAGNOSIS — C9000 Multiple myeloma not having achieved remission: Secondary | ICD-10-CM

## 2021-03-28 DIAGNOSIS — M7022 Olecranon bursitis, left elbow: Secondary | ICD-10-CM | POA: Insufficient documentation

## 2021-03-28 DIAGNOSIS — M79605 Pain in left leg: Secondary | ICD-10-CM | POA: Insufficient documentation

## 2021-03-28 DIAGNOSIS — S53442S Ulnar collateral ligament sprain of left elbow, sequela: Secondary | ICD-10-CM

## 2021-03-28 DIAGNOSIS — G8929 Other chronic pain: Secondary | ICD-10-CM | POA: Diagnosis present

## 2021-03-28 DIAGNOSIS — M5441 Lumbago with sciatica, right side: Secondary | ICD-10-CM | POA: Insufficient documentation

## 2021-03-28 DIAGNOSIS — M25552 Pain in left hip: Secondary | ICD-10-CM | POA: Insufficient documentation

## 2021-03-28 DIAGNOSIS — M25522 Pain in left elbow: Secondary | ICD-10-CM | POA: Insufficient documentation

## 2021-03-28 DIAGNOSIS — M5442 Lumbago with sciatica, left side: Secondary | ICD-10-CM | POA: Insufficient documentation

## 2021-03-28 DIAGNOSIS — S53441S Ulnar collateral ligament sprain of right elbow, sequela: Secondary | ICD-10-CM

## 2021-03-28 DIAGNOSIS — F1290 Cannabis use, unspecified, uncomplicated: Secondary | ICD-10-CM | POA: Diagnosis present

## 2021-03-28 DIAGNOSIS — M5416 Radiculopathy, lumbar region: Secondary | ICD-10-CM | POA: Diagnosis present

## 2021-03-28 DIAGNOSIS — M25561 Pain in right knee: Secondary | ICD-10-CM | POA: Insufficient documentation

## 2021-03-28 DIAGNOSIS — M7701 Medial epicondylitis, right elbow: Secondary | ICD-10-CM | POA: Diagnosis present

## 2021-03-28 DIAGNOSIS — M79604 Pain in right leg: Secondary | ICD-10-CM | POA: Diagnosis present

## 2021-03-28 DIAGNOSIS — M25562 Pain in left knee: Secondary | ICD-10-CM | POA: Diagnosis present

## 2021-03-28 DIAGNOSIS — M25551 Pain in right hip: Secondary | ICD-10-CM | POA: Diagnosis present

## 2021-03-28 DIAGNOSIS — Z79891 Long term (current) use of opiate analgesic: Secondary | ICD-10-CM

## 2021-03-28 DIAGNOSIS — G894 Chronic pain syndrome: Secondary | ICD-10-CM

## 2021-03-28 DIAGNOSIS — M7702 Medial epicondylitis, left elbow: Secondary | ICD-10-CM | POA: Insufficient documentation

## 2021-03-28 DIAGNOSIS — Z79899 Other long term (current) drug therapy: Secondary | ICD-10-CM | POA: Diagnosis present

## 2021-03-28 DIAGNOSIS — M7021 Olecranon bursitis, right elbow: Secondary | ICD-10-CM

## 2021-03-28 DIAGNOSIS — R892 Abnormal level of other drugs, medicaments and biological substances in specimens from other organs, systems and tissues: Secondary | ICD-10-CM

## 2021-03-28 DIAGNOSIS — M25521 Pain in right elbow: Secondary | ICD-10-CM | POA: Diagnosis present

## 2021-03-28 HISTORY — DX: Cannabis use, unspecified, uncomplicated: F12.90

## 2021-03-28 MED ORDER — OXYCODONE HCL 5 MG PO TABS
5.0000 mg | ORAL_TABLET | Freq: Three times a day (TID) | ORAL | 0 refills | Status: DC | PRN
  Filled 2021-05-07 (×2): qty 90, 30d supply, fill #0

## 2021-03-28 MED ORDER — OXYCODONE HCL 5 MG PO TABS
5.0000 mg | ORAL_TABLET | Freq: Three times a day (TID) | ORAL | 0 refills | Status: DC | PRN
  Filled 2021-04-05 (×4): qty 90, 30d supply, fill #0

## 2021-03-28 MED ORDER — NONFORMULARY OR COMPOUNDED ITEM: 2 refills | Status: AC

## 2021-03-28 MED ORDER — OXYCODONE HCL 5 MG PO TABS
5.0000 mg | ORAL_TABLET | Freq: Three times a day (TID) | ORAL | 0 refills | Status: DC | PRN
  Filled 2021-06-06 – 2021-06-07 (×2): qty 90, 30d supply, fill #0

## 2021-03-28 NOTE — Patient Instructions (Addendum)
____________________________________________________________________________________________  [facility]  [phone]   for compounded cream  Drug Holidays (Slow)  What is a "Drug Holiday"? Drug Holiday: is the name given to the period of time during which a patient stops taking a medication(s) for the purpose of eliminating tolerance to the drug.  Benefits Improved effectiveness of opioids. Decreased opioid dose needed to achieve benefits. Improved pain with lesser dose.  What is tolerance? Tolerance: is the progressive decreased in effectiveness of a drug due to its repetitive use. With repetitive use, the body gets use to the medication and as a consequence, it loses its effectiveness. This is a common problem seen with opioid pain medications. As a result, a larger dose of the drug is needed to achieve the same effect that used to be obtained with a smaller dose.  How long should a "Drug Holiday" last? You should stay off of the pain medicine for at least 14 consecutive days. (2 weeks)  Should I stop the medicine "cold [place]"? No. You should always coordinate with your Pain Specialist so that he/she can provide you with the correct medication dose to make the transition as smoothly as possible.  How do I stop the medicine? Slowly. You will be instructed to decrease the daily amount of pills that you take by one (1) pill every seven (7) days. This is called a "slow downward taper" of your dose. For example: if you normally take four (4) pills per day, you will be asked to drop this dose to three (3) pills per day for seven (7) days, then to two (2) pills per day for seven (7) days, then to one (1) per day for seven (7) days, and at the end of those last seven (7) days, this is when the "Drug Holiday" would start.   Will I have withdrawals? By doing a "slow downward taper" like this one, it is unlikely that you will experience any significant withdrawal symptoms. Typically, what  triggers withdrawals is the sudden stop of a high dose opioid therapy. Withdrawals can usually be avoided by slowly decreasing the dose over a prolonged period of time. If you do not follow these instructions and decide to stop your medication abruptly, withdrawals may be possible.  What are withdrawals? Withdrawals: refers to the wide range of symptoms that occur after stopping or dramatically reducing opiate drugs after heavy and prolonged use. Withdrawal symptoms do not occur to patients that use low dose opioids, or those who take the medication sporadically. Contrary to benzodiazepine (example: Valium, Xanax, etc.) or alcohol withdrawals ("Delirium Tremens"), opioid withdrawals are not lethal. Withdrawals are the physical manifestation of the body getting rid of the excess receptors.  Expected Symptoms Early symptoms of withdrawal may include: Agitation Anxiety Muscle aches Increased tearing Insomnia Runny nose Sweating Yawning  Late symptoms of withdrawal may include: Abdominal cramping Diarrhea Dilated pupils Goose bumps Nausea Vomiting  Will I experience withdrawals? Due to the slow nature of the taper, it is very unlikely that you will experience any.  What is a slow taper? Taper: refers to the gradual decrease in dose.  (Last update: 11/10/2019) ____________________________________________________________________________________________   ____________________________________________________________________________________________  Medication Rules  Purpose: To inform patients, and their family members, of our rules and regulations.  Applies to: All patients receiving prescriptions (written or electronic).  Pharmacy of record: Pharmacy where electronic prescriptions will be sent. If written prescriptions are taken to a different pharmacy, please inform the nursing staff. The pharmacy listed in the electronic medical record should be  the one where you would like  electronic prescriptions to be sent.  Electronic prescriptions: In compliance with the [place] (STOP) Act of 2017 (Session Lanny Cramp [phone]), effective April 22, 2018, all controlled substances must be electronically prescribed. Calling prescriptions to the pharmacy will cease to exist.  Prescription refills: Only during scheduled appointments. Applies to all prescriptions.  NOTE: The following applies primarily to controlled substances (Opioid* Pain Medications).   Type of encounter (visit): For patients receiving controlled substances, face-to-face visits are required. (Not an option or up to the patient.)  Patient's responsibilities: Pain Pills: Bring all pain pills to every appointment (except for procedure appointments). Pill Bottles: Bring pills in original pharmacy bottle. Always bring the newest bottle. Bring bottle, even if empty. Medication refills: You are responsible for knowing and keeping track of what medications you take and those you need refilled. The day before your appointment: write a list of all prescriptions that need to be refilled. The day of the appointment: give the list to the admitting nurse. Prescriptions will be written only during appointments. No prescriptions will be written on procedure days. If you forget a medication: it will not be "Called in", "Faxed", or "electronically sent". You will need to get another appointment to get these prescribed. No early refills. Do not call asking to have your prescription filled early. Prescription Accuracy: You are responsible for carefully inspecting your prescriptions before leaving our office. Have the discharge nurse carefully go over each prescription with you, before taking them home. Make sure that your name is accurately spelled, that your address is correct. Check the name and dose of your medication to make sure it is accurate. Check the number of pills, and the written  instructions to make sure they are clear and accurate. Make sure that you are given enough medication to last until your next medication refill appointment. Taking Medication: Take medication as prescribed. When it comes to controlled substances, taking less pills or less frequently than prescribed is permitted and encouraged. Never take more pills than instructed. Never take medication more frequently than prescribed.  Inform other Doctors: Always inform, all of your healthcare providers, of all the medications you take. Pain Medication from other Providers: You are not allowed to accept any additional pain medication from any other Doctor or Healthcare provider. There are two exceptions to this rule. (see below) In the event that you require additional pain medication, you are responsible for notifying us, as stated below. Cough Medicine: Often these contain an opioid, such as codeine or hydrocodone. Never accept or take cough medicine containing these opioids if you are already taking an opioid* medication. The combination may cause respiratory failure and death. Medication Agreement: You are responsible for carefully reading and following our Medication Agreement. This must be signed before receiving any prescriptions from our practice. Safely store a copy of your signed Agreement. Violations to the Agreement will result in no further prescriptions. (Additional copies of our Medication Agreement are available upon request.) Laws, Rules, & Regulations: All patients are expected to follow all Federal and [employer], [employer], Rules, [employer] [facility]. Ignorance of the Laws does not constitute a valid excuse.  Illegal drugs and Controlled Substances: The use of illegal substances (including, but not limited to marijuana and its derivatives) and/or the illegal use of any controlled substances is strictly prohibited. Violation of this rule may result in the immediate and permanent discontinuation of any and all  prescriptions being written by our practice. The  use of any illegal substances is prohibited. Adopted CDC guidelines & recommendations: Target dosing levels will be at or below 60 MME/day. Use of benzodiazepines** is not recommended.  Exceptions: There are only two exceptions to the rule of not receiving pain medications from other Healthcare Providers. Exception #1 (Emergencies): In the event of an emergency (i.e.: accident requiring emergency care), you are allowed to receive additional pain medication. However, you are responsible for: As soon as you are able, call our office [phone], at any time of the day or night, and leave a message stating your name, the date and nature of the emergency, and the name and dose of the medication prescribed. In the event that your call is answered by a member of our staff, make sure to document and save the date, time, and the name of the person that took your information.  Exception #2 (Planned Surgery): In the event that you are scheduled by another doctor or dentist to have any type of surgery or procedure, you are allowed (for a period no longer than 30 days), to receive additional pain medication, for the acute post-op pain. However, in this case, you are responsible for picking up a copy of our "Post-op Pain Management for Surgeons" handout, and giving it to your surgeon or dentist. This document is available at our office, and does not require an appointment to obtain it. Simply go to our office during business hours (Monday-Thursday from 8:00 AM to 4:00 PM) (Friday 8:00 AM to 12:00 Noon) or if you have a scheduled appointment with [place], prior to your surgery, and ask for it by name. In addition, you are responsible for: calling our office [phone], at any time of the day or night, and leaving a message stating your name, name of your surgeon, type of surgery, and date of procedure or surgery. Failure to comply with your responsibilities may result in  termination of therapy involving the controlled substances. Medication Agreement Violation. Following the above rules, including your responsibilities will help you in avoiding a Medication Agreement Violation ("Breaking your Pain Medication Contract").  *Opioid medications include: morphine, codeine, oxycodone, oxymorphone, hydrocodone, hydromorphone, meperidine, tramadol, tapentadol, buprenorphine, fentanyl, methadone. **Benzodiazepine medications include: diazepam (Valium), alprazolam (Xanax), clonazepam (Klonopine), lorazepam (Ativan), clorazepate (Tranxene), chlordiazepoxide (Librium), estazolam (Prosom), oxazepam (Serax), temazepam (Restoril), triazolam (Halcion) (Last updated: 01/17/2021) ____________________________________________________________________________________________  ____________________________________________________________________________________________  Medication Recommendations and Reminders  Applies to: All patients receiving prescriptions (written and/or electronic).  Medication Rules & Regulations: These rules and regulations exist for your safety and that of others. They are not flexible and neither are we. Dismissing or ignoring them will be considered "non-compliance" with medication therapy, resulting in complete and irreversible termination of such therapy. (See document titled "Medication Rules" for more details.) In all conscience, because of safety reasons, we cannot continue providing a therapy where the patient does not follow instructions.  Pharmacy of record:  Definition: This is the pharmacy where your electronic prescriptions will be sent.  We do not endorse any particular pharmacy, however, we have experienced problems with [facility] not securing enough medication supply for the community. We do not restrict you in your choice of pharmacy. However, once we write for your prescriptions, we will NOT be re-sending more prescriptions to fix restricted  supply problems created by your pharmacy, or your insurance.  The pharmacy listed in the electronic medical record should be the one where you want electronic prescriptions to be sent. If you choose to change pharmacy, simply notify our nursing  staff.  Recommendations: Keep all of your pain medications in a safe place, under lock and key, even if you live alone. We will NOT replace lost, stolen, or damaged medication. After you fill your prescription, take 1 week's worth of pills and put them away in a safe place. You should keep a separate, properly labeled bottle for this purpose. The remainder should be kept in the original bottle. Use this as your primary supply, until it runs out. Once it's gone, then you know that you have 1 week's worth of medicine, and it is time to come in for a prescription refill. If you do this correctly, it is unlikely that you will ever run out of medicine. To make sure that the above recommendation works, it is very important that you make sure your medication refill appointments are scheduled at least 1 week before you run out of medicine. To do this in an effective manner, make sure that you do not leave the office without scheduling your next medication management appointment. Always ask the nursing staff to show you in your prescription , when your medication will be running out. Then arrange for the receptionist to get you a return appointment, at least 7 days before you run out of medicine. Do not wait until you have 1 or 2 pills left, to come in. This is very poor planning and does not take into consideration that we may need to cancel appointments due to bad weather, sickness, or emergencies affecting our staff. DO NOT ACCEPT A "Partial Fill": If for any reason your pharmacy does not have enough pills/tablets to completely fill or refill your prescription, do not allow for a "partial fill". The law allows the pharmacy to complete that prescription within 72 hours,  without requiring a new prescription. If they do not fill the rest of your prescription within those 72 hours, you will need a separate prescription to fill the remaining amount, which we will NOT provide. If the reason for the partial fill is your insurance, you will need to talk to the pharmacist about payment alternatives for the remaining tablets, but again, DO NOT ACCEPT A PARTIAL FILL, unless you can trust your pharmacist to obtain the remainder of the pills within 72 hours.  Prescription refills and/or changes in medication(s):  Prescription refills, and/or changes in dose or medication, will be conducted only during scheduled medication management appointments. (Applies to both, written and electronic prescriptions.) No refills on procedure days. No medication will be changed or started on procedure days. No changes, adjustments, and/or refills will be conducted on a procedure day. Doing so will interfere with the diagnostic portion of the procedure. No phone refills. No medications will be "called into the pharmacy". No Fax refills. No weekend refills. No Holliday refills. No after hours refills.  Remember:  Business hours are:  Monday to Thursday 8:00 AM to 4:00 PM Provider's Schedule: Milinda Pointer, MD - Appointments are:  Medication management: Monday and Wednesday 8:00 AM to 4:00 PM Procedure day: Tuesday and Thursday 7:30 AM to 4:00 PM Gillis Santa, MD - Appointments are:  Medication management: Tuesday and Thursday 8:00 AM to 4:00 PM Procedure day: Monday and Wednesday 7:30 AM to 4:00 PM (Last update: 11/10/2019) ____________________________________________________________________________________________  ____________________________________________________________________________________________  CBD (cannabidiol) & Delta-8 (Delta-8 tetrahydrocannabinol) WARNING  Intro: Cannabidiol (CBD) and tetrahydrocannabinol (THC), are two natural compounds found in plants of the  Cannabis genus. They can both be extracted from hemp or cannabis. Hemp and cannabis come from the Cannabis  sativa plant. Both compounds interact with your body's endocannabinoid system, but they have very different effects. CBD does not produce the high sensation associated with cannabis. Delta-8 tetrahydrocannabinol, also known as delta-8 THC, is a psychoactive substance found in the Cannabis sativa plant, of which marijuana and hemp are two varieties. THC is responsible for the high associated with the illicit use of marijuana.  Applicable to: All individuals currently taking or considering taking CBD (cannabidiol) and, more important, all patients taking opioid analgesic controlled substances (pain medication). (Example: oxycodone; oxymorphone; hydrocodone; hydromorphone; morphine; methadone; tramadol; tapentadol; fentanyl; buprenorphine; butorphanol; dextromethorphan; meperidine; codeine; etc.)  Legal status: CBD remains a Schedule I drug prohibited for any use. CBD is illegal with one exception. In the [place], CBD has a limited Transport planner (FDA) approval for the treatment of two specific types of epilepsy disorders. Only one CBD product has been approved by the FDA for this purpose: "Epidiolex". FDA is aware that some companies are marketing products containing cannabis and cannabis-derived compounds in ways that violate the [employer], Drug and Cosmetic Act [facility] Act) and that may put the health and safety of consumers at risk. The FDA, a Federal agency, has not enforced the CBD status since 2018.   Legality: Some manufacturers ship CBD products nationally, which is illegal. Often such products are sold online and are therefore available throughout the country. CBD is openly sold in head shops and health food stores in some states where such sales have not been explicitly legalized. Selling unapproved products with unsubstantiated therapeutic claims is not only a violation of  the law, but also can put patients at risk, as these products have not been proven to be safe or effective. Federal illegality makes it difficult to conduct research on CBD.  Reference: "FDA Regulation of Cannabis and Cannabis-Derived Products, Including Cannabidiol (CBD)" - [employer]  Warning: CBD is not FDA approved and has not undergo the same manufacturing controls as prescription drugs.  This means that the purity and safety of available CBD may be questionable. Most of the time, despite manufacturer's claims, it is contaminated with THC (delta-9-tetrahydrocannabinol - the chemical in marijuana responsible for the "HIGH").  When this is the case, the [facility] contaminant will trigger a positive urine drug screen (UDS) test for Marijuana (carboxy-THC). Because a positive UDS for any illicit substance is a violation of our medication agreement, your opioid analgesics (pain medicine) may be permanently discontinued. The FDA recently put out a warning about 5 things that everyone should be aware of regarding Delta-8 THC: Delta-8 THC products have not been evaluated or approved by the FDA for safe use and may be marketed in ways that put the public health at risk. The FDA has received adverse event reports involving delta-8 THC-containing products. Delta-8 THC has psychoactive and intoxicating effects. Delta-8 THC manufacturing often involve use of potentially harmful chemicals to create the concentrations of delta-8 THC claimed in the marketplace. The final delta-8 THC product may have potentially harmful by-products (contaminants) due to the chemicals used in the process. Manufacturing of delta-8 THC products may occur in uncontrolled or unsanitary settings, which may lead to the presence of unsafe contaminants or other potentially harmful substances. Delta-8 THC products should be kept  out of the reach of children and pets.  MORE ABOUT CBD  General Information: CBD was discovered in [birth year] and it is a derivative of the cannabis sativa genus plants (Marijuana and Hemp). It is one of the 113 identified substances found in  Marijuana. It accounts for up to 40% of the plant's extract. As of 2018, preliminary clinical studies on CBD included research for the treatment of anxiety, movement disorders, and pain. CBD is available and consumed in multiple forms, including inhalation of smoke or vapor, as an aerosol spray, and by mouth. It may be supplied as an oil containing CBD, capsules, dried cannabis, or as a liquid solution. CBD is thought not to be as psychoactive as THC (delta-9-tetrahydrocannabinol - the chemical in marijuana responsible for the "HIGH"). Studies suggest that CBD may interact with different biological target receptors in the body, including cannabinoid and other neurotransmitter receptors. As of 2018 the mechanism of action for its biological effects has not been determined.  Side-effects  Adverse reactions: Dry mouth, diarrhea, decreased appetite, fatigue, drowsiness, malaise, weakness, sleep disturbances, and others.  Drug interactions: CBC may interact with other medications such as blood-thinners. (Last update: 01/19/2021) ____________________________________________________________________________________________

## 2021-03-28 NOTE — Progress Notes (Signed)
Nursing Pain Medication Assessment:  Safety precautions to be maintained throughout the outpatient stay will include: orient to surroundings, keep bed in low position, maintain call bell within reach at all times, provide assistance with transfer out of bed and ambulation.  Medication Inspection Compliance: Pill count conducted under aseptic conditions, in front of the patient. Neither the pills nor the bottle was removed from the patient's sight at any time. Once count was completed pills were immediately returned to the patient in their original bottle.  Medication: Oxycodone IR Pill/Patch Count:  15 of 90 pills remain Pill/Patch Appearance: Markings consistent with prescribed medication Bottle Appearance: Standard pharmacy container. Clearly labeled. Filled Date: 56 / 14 / 2022 Last Medication intake:  Today

## 2021-03-30 ENCOUNTER — Other Ambulatory Visit: Payer: Self-pay | Admitting: *Deleted

## 2021-03-30 MED ORDER — LENALIDOMIDE 5 MG PO CAPS: 5.0000 mg | ORAL_CAPSULE | Freq: Every day | ORAL | 0 refills | Status: DC

## 2021-04-04 ENCOUNTER — Other Ambulatory Visit: Payer: Self-pay

## 2021-04-05 ENCOUNTER — Other Ambulatory Visit: Payer: Self-pay

## 2021-04-05 NOTE — Telephone Encounter (Signed)
Has not been seen after 1/22 do you need to see in office

## 2021-04-05 NOTE — Telephone Encounter (Signed)
I actually haven't seen him for general follow up since November 2021.  I also indicated on his last refills for both Lyrica and Baclofen that he needed an office visit for further refills. He has yet to comply.  He must be seen before I can provide refills.

## 2021-04-06 ENCOUNTER — Telehealth: Payer: Self-pay | Admitting: Primary Care

## 2021-04-06 NOTE — Telephone Encounter (Signed)
Pt is wanting a call back concerning his most recent mychart message. [phone]

## 2021-04-06 NOTE — Telephone Encounter (Signed)
Called patient appointment made for next week will have meds to last until appointment.

## 2021-04-06 NOTE — Telephone Encounter (Signed)
See mychart message for documentation 

## 2021-04-06 NOTE — Telephone Encounter (Signed)
Left message to return call to our office.  

## 2021-04-11 ENCOUNTER — Other Ambulatory Visit: Payer: Self-pay

## 2021-04-11 ENCOUNTER — Encounter: Payer: Self-pay | Admitting: Primary Care

## 2021-04-11 ENCOUNTER — Ambulatory Visit (INDEPENDENT_AMBULATORY_CARE_PROVIDER_SITE_OTHER): Payer: No Typology Code available for payment source | Admitting: Primary Care

## 2021-04-11 VITALS — BP 150/100 | HR 79 | Temp 97.7°F | Ht 67.0 in | Wt 145.0 lb

## 2021-04-11 DIAGNOSIS — T451X5A Adverse effect of antineoplastic and immunosuppressive drugs, initial encounter: Secondary | ICD-10-CM

## 2021-04-11 DIAGNOSIS — M792 Neuralgia and neuritis, unspecified: Secondary | ICD-10-CM

## 2021-04-11 DIAGNOSIS — Z1211 Encounter for screening for malignant neoplasm of colon: Secondary | ICD-10-CM | POA: Diagnosis not present

## 2021-04-11 DIAGNOSIS — M7918 Myalgia, other site: Secondary | ICD-10-CM | POA: Diagnosis not present

## 2021-04-11 DIAGNOSIS — I10 Essential (primary) hypertension: Secondary | ICD-10-CM

## 2021-04-11 DIAGNOSIS — G620 Drug-induced polyneuropathy: Secondary | ICD-10-CM

## 2021-04-11 DIAGNOSIS — N1831 Chronic kidney disease, stage 3a: Secondary | ICD-10-CM

## 2021-04-11 DIAGNOSIS — F411 Generalized anxiety disorder: Secondary | ICD-10-CM

## 2021-04-11 DIAGNOSIS — G894 Chronic pain syndrome: Secondary | ICD-10-CM

## 2021-04-11 DIAGNOSIS — M62830 Muscle spasm of back: Secondary | ICD-10-CM

## 2021-04-11 DIAGNOSIS — G8929 Other chronic pain: Secondary | ICD-10-CM

## 2021-04-11 DIAGNOSIS — C9000 Multiple myeloma not having achieved remission: Secondary | ICD-10-CM

## 2021-04-11 MED ORDER — PREGABALIN 150 MG PO CAPS
150.0000 mg | ORAL_CAPSULE | Freq: Three times a day (TID) | ORAL | 0 refills | Status: DC | PRN
  Filled 2021-04-11: qty 270, 90d supply, fill #0

## 2021-04-11 MED ORDER — AMLODIPINE BESYLATE 5 MG PO TABS
5.0000 mg | ORAL_TABLET | Freq: Every day | ORAL | 0 refills | Status: DC
  Filled 2021-04-11: qty 30, 30d supply, fill #0

## 2021-04-11 MED ORDER — BACLOFEN 10 MG PO TABS
10.0000 mg | ORAL_TABLET | Freq: Four times a day (QID) | ORAL | 0 refills | Status: DC | PRN
  Filled 2021-04-11: qty 360, 90d supply, fill #0

## 2021-04-11 NOTE — Assessment & Plan Note (Signed)
Appears stable.  Will work to treat hypertension. Repeat BMP in a few weeks.

## 2021-04-11 NOTE — Assessment & Plan Note (Signed)
Stable on Lyrica 150 mg TID, continue same. Refills provided.

## 2021-04-11 NOTE — Assessment & Plan Note (Signed)
Following with Dr. Janese Banks, office visit from November 2022 reviewed.  Labs from cancer center reviewed from October 2022.  Continue Revlimid 5 mg daily.

## 2021-04-11 NOTE — Progress Notes (Signed)
Subjective:    Patient ID: James Box., male    DOB: 1970/05/18, 50 y.o.   MRN: [id number]  HPI  James Henningsen. is a very pleasant 50 y.o. male with a history of chronic back pain, chronic pain syndrome, DDD of thoracic and lumbar spine, multiple myeloma, hypertension, opioid dependence who presents today for follow up of chronic conditions and to discuss hypertension.  1) Essential Hypertension: Chronic, is not on treatment. Has been told by numerous providers to see PCP for elevated BP readings. He does not check his BP at home. He does experience daily headaches to the mid frontal lobe which began about one month ago.   He's unsure if he's been treated for hypertension in the past.   BP Readings from Last 3 Encounters:  04/11/21 (!) 150/100  03/28/21 (!) 149/101  03/06/21 (!) 149/100   2) Chronic Pain Syndrome/Chronic Back Pain: Following with pain management mostly. Managed on oxycodone IR 5 mg for which he takes every 8 hours. No updates from pain management.   Also managed on baclofen 10 mg QID and Lyrica 150 mg TID. He is due for refills.   3) Multiple Myeloma: Following with oncology, managed on Revlimid 5 mg daily. In remission. Last office visit was in November 2022.   4) Anxiety: Chronic for years. Currently managed on sertraline 25 mg for which he began one year ago. Prescribes by Dr. Janese Banks, oncology. He has noticed improvement in anxiety.      Review of Systems  Respiratory:  Negative for shortness of breath.   Cardiovascular:  Negative for chest pain.  Musculoskeletal:  Positive for arthralgias and back pain.  Neurological:  Positive for headaches. Negative for dizziness.  Psychiatric/Behavioral:  The patient is not nervous/anxious.         Past Medical History:  Diagnosis Date   CKD (chronic kidney disease) stage 3, GFR 30-59 ml/min (HCC)    Hypertension    Multiple myeloma ([place]) 08/11/2017   Neuropathy    Pneumonia     Social History    Socioeconomic History   Marital status: Legally Separated    Spouse name: [name]   Number of children: 3   Years of education: Not on file   Highest education level: Not on file  Occupational History   Not on file  Tobacco Use   Smoking status: Former   Smokeless tobacco: Current    Types: Snuff  Vaping Use   Vaping Use: Never used  Substance and Sexual Activity   Alcohol use: No   Drug use: No   Sexual activity: Yes  Other Topics Concern   Not on file  Social History Narrative   Live in private residence with spouse and mother in law   Social Determinants of Health   Financial Resource Strain: Not on file  Food Insecurity: Not on file  Transportation Needs: Not on file  Physical Activity: Not on file  Stress: Not on file  Social Connections: Not on file  Intimate Partner Violence: Not on file    Past Surgical History:  Procedure Laterality Date   ABDOMINAL SURGERY     JOINT REPLACEMENT     right knee   KNEE SURGERY Left     Family History  Problem Relation Age of Onset   Cancer Mother 37       Pancreatic   COPD Mother    Diabetes Mother    Hyperlipidemia Mother    Hypertension Mother  Cancer Maternal Uncle        pancreatic   Cancer Maternal Grandmother 48       colon   COPD Maternal Grandmother    Diabetes Maternal Grandmother    Hypertension Maternal Grandmother     No Known Allergies  Current Outpatient Medications on File Prior to Visit  Medication Sig Dispense Refill   aspirin EC 81 MG tablet Take 81 mg by mouth daily.     baclofen (LIORESAL) 10 MG tablet Take 1 tablet (10 mg total) by mouth 4 (four) times daily as needed for muscle spasms. Office visit required for further refills. 120 tablet 0   lenalidomide (REVLIMID) 5 MG capsule Take 1 capsule (5 mg total) by mouth daily. [employer] Auth # [id number]     Date Obtained 03/30/2021 28 capsule 0   NONFORMULARY OR COMPOUNDED ITEM Sig: Apply 1-2 gm(s) (2-4 pumps) to affected area, 3-4 times/day.  (1 pump = 0.5 gm) 1 each 2   oxyCODONE (OXY IR/ROXICODONE) 5 MG immediate release tablet Take 1 tablet (5 mg total) by mouth every 8 (eight) hours as needed for severe pain. Must last 30 days. 90 tablet 0   [START ON 05/05/2021] oxyCODONE (OXY IR/ROXICODONE) 5 MG immediate release tablet Take 1 tablet (5 mg total) by mouth every 8 (eight) hours as needed for severe pain. Must last 30 days. 90 tablet 0   [START ON 06/04/2021] oxyCODONE (OXY IR/ROXICODONE) 5 MG immediate release tablet Take 1 tablet (5 mg total) by mouth every 8 (eight) hours as needed for severe pain. Must last 30 days. 90 tablet 0   pregabalin (LYRICA) 150 MG capsule Take 1 capsule (150 mg total) by mouth 3 (three) times daily as needed for pain. **Office visit required for further refills.** 90 capsule 0   sertraline (ZOLOFT) 25 MG tablet TAKE 1 TABLET BY MOUTH ONCE DAILY 30 tablet 2   No current facility-administered medications on file prior to visit.    BP (!) 150/100    Pulse 79    Temp 97.7 F (36.5 C) (Temporal)    Ht '5\' 7"'  (1.702 m)    Wt 145 lb (65.8 kg)    SpO2 99%    BMI 22.71 kg/m  Objective:   Physical Exam Cardiovascular:     Rate and Rhythm: Normal rate and regular rhythm.  Pulmonary:     Effort: Pulmonary effort is normal.     Breath sounds: Normal breath sounds. No wheezing or rales.  Musculoskeletal:     Cervical back: Neck supple.  Skin:    General: Skin is warm and dry.  Neurological:     Mental Status: He is alert and oriented to person, place, and time.  Psychiatric:        Mood and Affect: Mood normal.          Assessment & Plan:      This visit occurred during the SARS-CoV-2 public health emergency.  Safety protocols were in place, including screening questions prior to the visit, additional usage of staff PPE, and extensive cleaning of exam room while observing appropriate contact time as indicated for disinfecting solutions.

## 2021-04-11 NOTE — Assessment & Plan Note (Signed)
Improved on sertraline 25 mg daily, prescribed by oncologist. Continue same.

## 2021-04-11 NOTE — Patient Instructions (Signed)
Start amlodipine 5 mg daily for high blood pressure. Take this once daily.  You will be contacted regarding your referral to GI for the colonoscopy.  Please let us know if you have not been contacted within two weeks.   We will see you in a few weeks for a blood pressure check.  It was a pleasure to see you today!

## 2021-04-11 NOTE — Assessment & Plan Note (Signed)
Uncontrolled, has not followed up as requested.  Rx for amlodipine 5 mg sent to pharmacy. We will see him back in 2-3 weeks for BP follow up.

## 2021-04-11 NOTE — Assessment & Plan Note (Signed)
Dependant on Baclofen 10 mg QID and Lyrica 150 mg TID, also follows with pain management.  Refills sent to pharmacy.

## 2021-04-11 NOTE — Assessment & Plan Note (Signed)
Following with pain management. Continue oxycodone 5 mg TID PRN. Continue Lyrica 150 mg TID and Baclofen 10 mg QID PRN.

## 2021-04-12 ENCOUNTER — Other Ambulatory Visit: Payer: Self-pay

## 2021-04-17 ENCOUNTER — Other Ambulatory Visit: Payer: Self-pay

## 2021-04-17 ENCOUNTER — Telehealth: Payer: Self-pay

## 2021-04-17 DIAGNOSIS — Z1211 Encounter for screening for malignant neoplasm of colon: Secondary | ICD-10-CM

## 2021-04-17 MED ORDER — CLENPIQ 10-3.5-12 MG-GM -GM/160ML PO SOLN
1.0000 | ORAL | 0 refills | Status: DC
  Filled 2021-04-17: qty 320, 2d supply, fill #0

## 2021-04-17 NOTE — Progress Notes (Signed)
Gastroenterology Pre-Procedure Review  Request Date: 05/25/2021 Requesting Physician: Dr. Marius Ditch   PATIENT REVIEW QUESTIONS: The patient responded to the following health history questions as indicated:    1. Are you having any GI issues? no 2. Do you have a personal history of Polyps? no 3. Do you have a family history of Colon Cancer or Polyps? yes (mother) 4. Diabetes Mellitus? no 5. Joint replacements in the past 12 months?no 6. Major health problems in the past 3 months?yes (muliple miloma cancer) 7. Any artificial heart valves, MVP, or defibrillator?no    MEDICATIONS & ALLERGIES:    Patient reports the following regarding taking any anticoagulation/antiplatelet therapy:   Plavix, Coumadin, Eliquis, Xarelto, Lovenox, Pradaxa, Brilinta, or Effient? no Aspirin? no  Patient confirms/reports the following medications:  Current Outpatient Medications  Medication Sig Dispense Refill   amLODipine (NORVASC) 5 MG tablet Take 1 tablet (5 mg total) by mouth daily. For blood pressure. 30 tablet 0   amLODipine (NORVASC) 5 MG tablet Take 5 mg by mouth daily.     aspirin EC 81 MG tablet Take 81 mg by mouth daily.     baclofen (LIORESAL) 10 MG tablet Take 1 tablet (10 mg total) by mouth 4 (four) times daily as needed for muscle spasms. 360 tablet 0   lenalidomide (REVLIMID) 5 MG capsule Take 1 capsule (5 mg total) by mouth daily. [employer] Auth # [id number]     Date Obtained 03/30/2021 28 capsule 0   NONFORMULARY OR COMPOUNDED ITEM Sig: Apply 1-2 gm(s) (2-4 pumps) to affected area, 3-4 times/day. (1 pump = 0.5 gm) 1 each 2   oxyCODONE (OXY IR/ROXICODONE) 5 MG immediate release tablet Take 1 tablet (5 mg total) by mouth every 8 (eight) hours as needed for severe pain. Must last 30 days. 90 tablet 0   [START ON 05/05/2021] oxyCODONE (OXY IR/ROXICODONE) 5 MG immediate release tablet Take 1 tablet (5 mg total) by mouth every 8 (eight) hours as needed for severe pain. Must last 30 days. 90 tablet 0   [START  ON 06/04/2021] oxyCODONE (OXY IR/ROXICODONE) 5 MG immediate release tablet Take 1 tablet (5 mg total) by mouth every 8 (eight) hours as needed for severe pain. Must last 30 days. 90 tablet 0   pregabalin (LYRICA) 150 MG capsule Take 1 capsule (150 mg total) by mouth 3 (three) times daily as needed (for pain). 270 capsule 0   sertraline (ZOLOFT) 25 MG tablet TAKE 1 TABLET BY MOUTH ONCE DAILY 30 tablet 2   No current facility-administered medications for this visit.    Patient confirms/reports the following allergies:  No Known Allergies  No orders of the defined types were placed in this encounter.   AUTHORIZATION INFORMATION Primary Insurance: 1D#: Group #:  Secondary Insurance: 1D#: Group #:  SCHEDULE INFORMATION: Date: 05/25/2021 Time: Location: [facility]

## 2021-04-17 NOTE — Telephone Encounter (Signed)
Scheduled for 05/25/2021

## 2021-04-18 ENCOUNTER — Other Ambulatory Visit: Payer: Self-pay

## 2021-04-20 ENCOUNTER — Other Ambulatory Visit: Payer: Self-pay

## 2021-04-24 ENCOUNTER — Other Ambulatory Visit: Payer: Self-pay

## 2021-04-25 ENCOUNTER — Other Ambulatory Visit: Payer: Self-pay | Admitting: *Deleted

## 2021-04-25 MED ORDER — LENALIDOMIDE 5 MG PO CAPS: 5.0000 mg | ORAL_CAPSULE | Freq: Every day | ORAL | 0 refills | Status: DC

## 2021-05-01 ENCOUNTER — Encounter: Payer: Self-pay | Admitting: Primary Care

## 2021-05-01 ENCOUNTER — Encounter: Payer: Self-pay | Admitting: Oncology

## 2021-05-01 ENCOUNTER — Other Ambulatory Visit: Payer: Self-pay

## 2021-05-01 ENCOUNTER — Ambulatory Visit: Payer: BC Managed Care – PPO | Admitting: Primary Care

## 2021-05-01 DIAGNOSIS — I10 Essential (primary) hypertension: Secondary | ICD-10-CM | POA: Diagnosis not present

## 2021-05-01 DIAGNOSIS — G43709 Chronic migraine without aura, not intractable, without status migrainosus: Secondary | ICD-10-CM

## 2021-05-01 DIAGNOSIS — G43909 Migraine, unspecified, not intractable, without status migrainosus: Secondary | ICD-10-CM | POA: Insufficient documentation

## 2021-05-01 MED ORDER — AMLODIPINE BESYLATE 10 MG PO TABS
10.0000 mg | ORAL_TABLET | Freq: Every day | ORAL | 0 refills | Status: DC
  Filled 2021-05-01: qty 90, 90d supply, fill #0

## 2021-05-01 NOTE — Progress Notes (Signed)
Subjective:    Patient ID: James House., male    DOB: November 10, 1970, 51 y.o.   MRN: [id number]  HPI  James Lantigua. is a very pleasant 51 y.o. male with a history of hypertension, multiple myeloma in remission, CKD, chronic pain syndrome, who presents today for follow up of hypertension.  He was last evaluated on 04/11/21 for follow up and hypertension. At the time he was not on treatment for BP, had been told by numerous providers previously to see PCP regarding hypertension. Given his readings we decided to initiate amlodipine 5 mg daily. He is here for follow up today.  Since his last visit he's compliant to amlodipine 5 mg daily. He's tracked his BP a few times which is running 140's/90's. He denies ankle edema, dizziness.   He continues to have headaches which are located to the mid frontal lobe. History of migraines that dates back to younger years. Headaches occur weekly, sometimes associated with photophobia and phonophobia. He's had to miss work due to headaches. He's never been treated for migraines. He's tried taking his narcotics for headaches, no improvement.   BP Readings from Last 3 Encounters:  05/01/21 (!) 140/94  04/11/21 (!) 150/100  03/28/21 (!) 149/101        Review of Systems  Eyes:  Positive for photophobia.  Respiratory:  Negative for shortness of breath.   Cardiovascular:  Negative for chest pain and leg swelling.  Neurological:  Positive for headaches.        Past Medical History:  Diagnosis Date   CKD (chronic kidney disease) stage 3, GFR 30-59 ml/min (HCC)    Hypertension    Multiple myeloma ([place]) 08/11/2017   Neuropathy    Pneumonia    Severe sepsis ([place]) 08/11/2017   Shingles 08/03/2019    Social History   Socioeconomic History   Marital status: Legally Separated    Spouse name: [name]   Number of children: 3   Years of education: Not on file   Highest education level: Not on file  Occupational History   Not on file  Tobacco Use    Smoking status: Former   Smokeless tobacco: Current    Types: Snuff  Vaping Use   Vaping Use: Never used  Substance and Sexual Activity   Alcohol use: No   Drug use: No   Sexual activity: Yes  Other Topics Concern   Not on file  Social History Narrative   Live in private residence with spouse and mother in law   Social Determinants of Health   Financial Resource Strain: Not on file  Food Insecurity: Not on file  Transportation Needs: Not on file  Physical Activity: Not on file  Stress: Not on file  Social Connections: Not on file  Intimate Partner Violence: Not on file    Past Surgical History:  Procedure Laterality Date   ABDOMINAL SURGERY     JOINT REPLACEMENT     right knee   KNEE SURGERY Left     Family History  Problem Relation Age of Onset   Cancer Mother 4       Pancreatic   COPD Mother    Diabetes Mother    Hyperlipidemia Mother    Hypertension Mother    Cancer Maternal Uncle        pancreatic   Cancer Maternal Grandmother 41       colon   COPD Maternal Grandmother    Diabetes Maternal Grandmother    Hypertension Maternal  Grandmother     No Known Allergies  Current Outpatient Medications on File Prior to Visit  Medication Sig Dispense Refill   aspirin EC 81 MG tablet Take 81 mg by mouth daily.     baclofen (LIORESAL) 10 MG tablet Take 1 tablet (10 mg total) by mouth 4 (four) times daily as needed for muscle spasms. 360 tablet 0   lenalidomide (REVLIMID) 5 MG capsule Take 1 capsule (5 mg total) by mouth daily. [employer] Auth #  [id number]   Date Obtained1/07/2021 28 capsule 0   NONFORMULARY OR COMPOUNDED ITEM Sig: Apply 1-2 gm(s) (2-4 pumps) to affected area, 3-4 times/day. (1 pump = 0.5 gm) 1 each 2   oxyCODONE (OXY IR/ROXICODONE) 5 MG immediate release tablet Take 1 tablet (5 mg total) by mouth every 8 (eight) hours as needed for severe pain. Must last 30 days. 90 tablet 0   [START ON 05/05/2021] oxyCODONE (OXY IR/ROXICODONE) 5 MG immediate release tablet  Take 1 tablet (5 mg total) by mouth every 8 (eight) hours as needed for severe pain. Must last 30 days. 90 tablet 0   [START ON 06/04/2021] oxyCODONE (OXY IR/ROXICODONE) 5 MG immediate release tablet Take 1 tablet (5 mg total) by mouth every 8 (eight) hours as needed for severe pain. Must last 30 days. 90 tablet 0   pregabalin (LYRICA) 150 MG capsule Take 1 capsule (150 mg total) by mouth 3 (three) times daily as needed (for pain). 270 capsule 0   sertraline (ZOLOFT) 25 MG tablet TAKE 1 TABLET BY MOUTH ONCE DAILY 30 tablet 2   Sod Picosulfate-Mag Ox-Cit Acd (CLENPIQ) 10-3.5-12 MG-GM -GM/160ML SOLN Take 1 kit by mouth as directed. At 5 PM evening before procedure, drink 1 bottle of Clenpiq, hydrate, drink (5) 8 oz of water. Then do the same thing 5 hours prior to your procedure. 320 mL 0   No current facility-administered medications on file prior to visit.    BP (!) 140/94    Pulse 88    Temp 97.6 F (36.4 C) (Temporal)    Ht _0  (1.702 m)    Wt 146 lb (66.2 kg)    SpO2 98%    BMI 22.87 kg/m  Objective:   Physical Exam Cardiovascular:     Rate and Rhythm: Normal rate and regular rhythm.  Pulmonary:     Effort: Pulmonary effort is normal.     Breath sounds: Normal breath sounds. No wheezing or rales.  Musculoskeletal:     Cervical back: Neck supple.  Skin:    General: Skin is warm and dry.  Neurological:     Mental Status: He is alert and oriented to person, place, and time.          Assessment & Plan:      This visit occurred during the SARS-CoV-2 public health emergency.  Safety protocols were in place, including screening questions prior to the visit, additional usage of staff PPE, and extensive cleaning of exam room while observing appropriate contact time as indicated for disinfecting solutions.

## 2021-05-01 NOTE — Assessment & Plan Note (Signed)
Improved but still not at goal.  Increase amlodipine to 10 mg, new Rx sent to pharmacy. He will continue to monitor BP at home, we will plan to meet back up in 2-3 weeks virtually.

## 2021-05-01 NOTE — Assessment & Plan Note (Signed)
Prior history, may also be experiencing now. Will work to get BP under control.  If BP is under control and headaches persist, may need to consider migraine prevention treatment. He agrees.   Follow up in 2-3 weeks virtually.

## 2021-05-01 NOTE — Patient Instructions (Signed)
We increased the dose of your amlodipine to 10 mg. I sent a new prescription to the pharmacy.  Continue to monitor your blood pressure.   We will meet back up in 2-3 weeks as discussed.  It was a pleasure to see you today!

## 2021-05-04 ENCOUNTER — Other Ambulatory Visit: Payer: Self-pay

## 2021-05-07 ENCOUNTER — Other Ambulatory Visit (HOSPITAL_COMMUNITY): Payer: Self-pay

## 2021-05-07 ENCOUNTER — Other Ambulatory Visit: Payer: Self-pay

## 2021-05-07 ENCOUNTER — Encounter: Payer: Self-pay | Admitting: Oncology

## 2021-05-08 ENCOUNTER — Encounter: Payer: Self-pay | Admitting: Oncology

## 2021-05-08 ENCOUNTER — Other Ambulatory Visit: Payer: Self-pay

## 2021-05-18 ENCOUNTER — Other Ambulatory Visit: Payer: Self-pay

## 2021-05-18 ENCOUNTER — Telehealth (INDEPENDENT_AMBULATORY_CARE_PROVIDER_SITE_OTHER): Payer: BC Managed Care – PPO | Admitting: Primary Care

## 2021-05-18 DIAGNOSIS — I10 Essential (primary) hypertension: Secondary | ICD-10-CM | POA: Diagnosis not present

## 2021-05-18 DIAGNOSIS — G43709 Chronic migraine without aura, not intractable, without status migrainosus: Secondary | ICD-10-CM | POA: Diagnosis not present

## 2021-05-18 MED ORDER — OLMESARTAN MEDOXOMIL 20 MG PO TABS
20.0000 mg | ORAL_TABLET | Freq: Every day | ORAL | 0 refills | Status: DC
  Filled 2021-05-18 – 2021-06-07 (×2): qty 30, 30d supply, fill #0

## 2021-05-18 NOTE — Assessment & Plan Note (Addendum)
Uncontrolled, no improvement with increased dose of amlodipine 10 mg.   Continue amlodipine 10 mg. Add olmesartan 20 mg daily.  He will monitor BP at home.  We will plan to see him back in 2-3 weeks for BP check and BMP. If BP controlled, then need to combine his amlodipine-olmesartan.

## 2021-05-18 NOTE — Patient Instructions (Signed)
Continue amlodipine 10 mg daily for blood pressure.  Start olmesartan 20 mg once daily for blood pressure.   Continue to monitor your blood pressure at home.  We will see you in the office in 2-3 weeks for blood pressure check.  It was a pleasure to see you today!

## 2021-05-18 NOTE — Assessment & Plan Note (Signed)
Continued, but BP remains uncontrolled.  Will continue to work to reduce BP first. If headaches persist despite BP control, then consider headache prevention treatment.

## 2021-05-18 NOTE — Progress Notes (Signed)
Patient ID: James House., male    DOB: 1970-09-02, 51 y.o.   MRN: [id number]  Virtual visit completed through [place], a video enabled telemedicine application. Due to national recommendations of social distancing due to COVID-19, a virtual visit is felt to be most appropriate for this patient at this time. Reviewed limitations, risks, security and privacy concerns of performing a virtual visit and the availability of in person appointments. I also reviewed that there may be a patient responsible charge related to this service. The patient agreed to proceed.   Patient location: home Provider location: [place] [facility], office Persons participating in this virtual visit: patient, provider   If any vitals were documented, they were collected by patient at home unless specified below.    BP (!) 142/100 Comment: per pt on 05/17/2021   Ht '5\' 7"'  (1.702 m)    Wt 150 lb (68 kg)    BMI 23.49 kg/m    CC: Follow Up for Migraines and BP Subjective:   HPI: James House. is a 51 y.o. male with a history of hypertension, migraines, chronic back pain, chronic pain syndrome presenting on 05/18/2021 for follow up of migraines and hypertension.   He was last evaluated on 05/01/21 for follow up of hypertension and to discuss headaches. During this visit we increased his amlodipine to 10 mg for better BP control. We discussed the potential need for headache/migraine treatment if headaches did not abate with reduction of BP.  Since his last visit he is compliant to amlodipine 10 mg daily. He is checking his BP at home which is running in the 140's/100's.   He continues to experience headaches which are located to the mid frontal lobe, seems worse now.   He denies chest pain, visual changes.   BP Readings from Last 3 Encounters:  05/18/21 (!) 142/100  05/01/21 (!) 140/94  04/11/21 (!) 150/100          Relevant past medical, surgical, family and social history reviewed and updated as  indicated. Interim medical history since our last visit reviewed. Allergies and medications reviewed and updated. Outpatient Medications Prior to Visit  Medication Sig Dispense Refill   amLODipine (NORVASC) 10 MG tablet Take 1 tablet (10 mg total) by mouth daily. For blood pressure. 90 tablet 0   aspirin EC 81 MG tablet Take 81 mg by mouth daily.     baclofen (LIORESAL) 10 MG tablet Take 1 tablet (10 mg total) by mouth 4 (four) times daily as needed for muscle spasms. 360 tablet 0   lenalidomide (REVLIMID) 5 MG capsule Take 1 capsule (5 mg total) by mouth daily. [employer] Auth #  [id number]   Date Obtained1/07/2021 28 capsule 0   NONFORMULARY OR COMPOUNDED ITEM Sig: Apply 1-2 gm(s) (2-4 pumps) to affected area, 3-4 times/day. (1 pump = 0.5 gm) 1 each 2   oxyCODONE (OXY IR/ROXICODONE) 5 MG immediate release tablet Take 1 tablet (5 mg total) by mouth every 8 (eight) hours as needed for severe pain. Must last 30 days. 90 tablet 0   [START ON 06/04/2021] oxyCODONE (OXY IR/ROXICODONE) 5 MG immediate release tablet Take 1 tablet (5 mg total) by mouth every 8 (eight) hours as needed for severe pain. Must last 30 days. 90 tablet 0   pregabalin (LYRICA) 150 MG capsule Take 1 capsule (150 mg total) by mouth 3 (three) times daily as needed (for pain). 270 capsule 0   sertraline (ZOLOFT) 25 MG tablet TAKE 1 TABLET  BY MOUTH ONCE DAILY 30 tablet 2   oxyCODONE (OXY IR/ROXICODONE) 5 MG immediate release tablet Take 1 tablet (5 mg total) by mouth every 8 (eight) hours as needed for severe pain. Must last 30 days. 90 tablet 0   Sod Picosulfate-Mag Ox-Cit Acd (CLENPIQ) 10-3.5-12 MG-GM -GM/160ML SOLN Take 1 kit by mouth as directed. At 5 PM evening before procedure, drink 1 bottle of Clenpiq, hydrate, drink (5) 8 oz of water. Then do the same thing 5 hours prior to your procedure. (Patient not taking: Reported on 05/18/2021) 320 mL 0   No facility-administered medications prior to visit.     Per HPI unless specifically  indicated in ROS section below Review of Systems  Eyes:  Negative for visual disturbance.  Cardiovascular:  Negative for chest pain.  Neurological:  Positive for headaches.  Objective:  BP (!) 142/100 Comment: per pt on 05/17/2021   Ht '5\' 7"'  (1.702 m)    Wt 150 lb (68 kg)    BMI 23.49 kg/m   Wt Readings from Last 3 Encounters:  05/18/21 150 lb (68 kg)  05/01/21 146 lb (66.2 kg)  04/11/21 145 lb (65.8 kg)       Physical exam: General: Alert and oriented x 3, no distress, does not appear sickly  Pulmonary: Speaks in complete sentences without increased work of breathing, no cough during visit.  Psychiatric: Normal mood, thought content, and behavior.     Results for orders placed or performed in visit on 02/01/21  Kappa/lambda light chains  Result Value Ref Range   Kappa free light chain 19.4 3.3 - 19.4 mg/L   Lambda free light chains 11.2 5.7 - 26.3 mg/L   Kappa, lambda light chain ratio 1.73 (H) 0.26 - 1.65  Multiple Myeloma Panel (SPEP&IFE w/QIG)  Result Value Ref Range   IgG (Immunoglobin G), Serum 801 603 - 1,613 mg/dL   IgA 167 90 - 386 mg/dL   IgM (Immunoglobulin M), Srm 52 20 - 172 mg/dL   Total Protein ELP 5.8 (L) 6.0 - 8.5 g/dL   Albumin SerPl Elph-Mcnc 3.6 2.9 - 4.4 g/dL   Alpha 1 0.1 0.0 - 0.4 g/dL   Alpha2 Glob SerPl Elph-Mcnc 0.5 0.4 - 1.0 g/dL   B-Globulin SerPl Elph-Mcnc 0.9 0.7 - 1.3 g/dL   Gamma Glob SerPl Elph-Mcnc 0.7 0.4 - 1.8 g/dL   M Protein SerPl Elph-Mcnc Not Observed Not Observed g/dL   Globulin, Total 2.2 2.2 - 3.9 g/dL   Albumin/Glob SerPl 1.7 0.7 - 1.7   IFE 1 Comment    Please Note Comment   Comprehensive metabolic panel  Result Value Ref Range   Sodium 139 135 - 145 mmol/L   Potassium 3.7 3.5 - 5.1 mmol/L   Chloride 105 98 - 111 mmol/L   CO2 27 22 - 32 mmol/L   Glucose, Bld 114 (H) 70 - 99 mg/dL   BUN 21 (H) 6 - 20 mg/dL   Creatinine, Ser 1.51 (H) 0.61 - 1.24 mg/dL   Calcium 9.0 8.9 - 10.3 mg/dL   Total Protein 6.4 (L) 6.5 - 8.1 g/dL    Albumin 3.9 3.5 - 5.0 g/dL   AST 23 15 - 41 U/L   ALT 19 0 - 44 U/L   Alkaline Phosphatase 99 38 - 126 U/L   Total Bilirubin 0.8 0.3 - 1.2 mg/dL   GFR, Estimated 56 (L) >60 mL/min   Anion gap 7 5 - 15  CBC with Differential  Result Value Ref Range  WBC 4.5 4.0 - 10.5 K/uL   RBC 4.47 4.22 - 5.81 MIL/uL   Hemoglobin 14.9 13.0 - 17.0 g/dL   HCT 42.5 39.0 - 52.0 %   MCV 95.1 80.0 - 100.0 fL   MCH 33.3 26.0 - 34.0 pg   MCHC 35.1 30.0 - 36.0 g/dL   RDW 12.9 11.5 - 15.5 %   Platelets 165 150 - 400 K/uL   nRBC 0.0 0.0 - 0.2 %   Neutrophils Relative % 74 %   Neutro Abs 3.3 1.7 - 7.7 K/uL   Lymphocytes Relative 15 %   Lymphs Abs 0.7 0.7 - 4.0 K/uL   Monocytes Relative 8 %   Monocytes Absolute 0.4 0.1 - 1.0 K/uL   Eosinophils Relative 2 %   Eosinophils Absolute 0.1 0.0 - 0.5 K/uL   Basophils Relative 1 %   Basophils Absolute 0.1 0.0 - 0.1 K/uL   Immature Granulocytes 0 %   Abs Immature Granulocytes 0.02 0.00 - 0.07 K/uL   Assessment & Plan:   Problem List Items Addressed This Visit       Cardiovascular and Mediastinum   HTN (hypertension)    Uncontrolled, no improvement with increased dose of amlodipine 10 mg.   Continue amlodipine 10 mg. Add olmesartan 20 mg daily.  He will monitor BP at home.  We will plan to see him back in 2-3 weeks for BP check and BMP. If BP controlled, then need to combine his amlodipine-olmesartan.      Relevant Medications   olmesartan (BENICAR) 20 MG tablet   Migraines    Continued, but BP remains uncontrolled.  Will continue to work to reduce BP first. If headaches persist despite BP control, then consider headache prevention treatment.        Relevant Medications   olmesartan (BENICAR) 20 MG tablet     Meds ordered this encounter  Medications   olmesartan (BENICAR) 20 MG tablet    Sig: Take 1 tablet (20 mg total) by mouth daily. For blood pressure.    Dispense:  30 tablet    Refill:  0    Order Specific Question:   Supervising  Provider    Answer:   BEDSOLE, AMY E [[id number]]   No orders of the defined types were placed in this encounter.   I discussed the assessment and treatment plan with the patient. The patient was provided an opportunity to ask questions and all were answered. The patient agreed with the plan and demonstrated an understanding of the instructions. The patient was advised to call back or seek an in-person evaluation if the symptoms worsen or if the condition fails to improve as anticipated.  Follow up plan:  Continue amlodipine 10 mg daily for blood pressure.  Start olmesartan 20 mg once daily for blood pressure.   Continue to monitor your blood pressure at home.  We will see you in the office in 2-3 weeks for blood pressure check.  It was a pleasure to see you today!   Pleas Koch, NP

## 2021-05-22 ENCOUNTER — Other Ambulatory Visit: Payer: Self-pay | Admitting: *Deleted

## 2021-05-22 MED ORDER — LENALIDOMIDE 5 MG PO CAPS: 5.0000 mg | ORAL_CAPSULE | Freq: Every day | ORAL | 0 refills | Status: DC

## 2021-05-23 ENCOUNTER — Telehealth: Payer: Self-pay

## 2021-05-23 NOTE — Telephone Encounter (Signed)
Patient states he does not get off work till 9pm and wants to know if he can take the first does of prep then. Informed patient he needed to start it at 5pm to be sure he is clean out for the procedure. He verbalized understanding

## 2021-05-24 ENCOUNTER — Encounter: Payer: Self-pay | Admitting: Gastroenterology

## 2021-05-25 ENCOUNTER — Ambulatory Visit: Payer: BC Managed Care – PPO | Admitting: Certified Registered"

## 2021-05-25 ENCOUNTER — Other Ambulatory Visit: Payer: Self-pay

## 2021-05-25 ENCOUNTER — Ambulatory Visit
Admission: RE | Admit: 2021-05-25 | Discharge: 2021-05-25 | Disposition: A | Discharge status: Home / Self Care | Payer: BC Managed Care – PPO | Attending: Gastroenterology | Admitting: Gastroenterology

## 2021-05-25 ENCOUNTER — Encounter: Payer: Self-pay | Admitting: Oncology

## 2021-05-25 ENCOUNTER — Encounter: Payer: Self-pay | Admitting: Gastroenterology

## 2021-05-25 ENCOUNTER — Encounter
Admission: RE | Disposition: A | Discharge status: Home / Self Care | Payer: Self-pay | Source: Home / Self Care | Attending: Gastroenterology

## 2021-05-25 DIAGNOSIS — Z79899 Other long term (current) drug therapy: Secondary | ICD-10-CM | POA: Insufficient documentation

## 2021-05-25 DIAGNOSIS — N183 Chronic kidney disease, stage 3 unspecified: Secondary | ICD-10-CM | POA: Insufficient documentation

## 2021-05-25 DIAGNOSIS — Z87891 Personal history of nicotine dependence: Secondary | ICD-10-CM | POA: Insufficient documentation

## 2021-05-25 DIAGNOSIS — Z1211 Encounter for screening for malignant neoplasm of colon: Secondary | ICD-10-CM | POA: Insufficient documentation

## 2021-05-25 DIAGNOSIS — I129 Hypertensive chronic kidney disease with stage 1 through stage 4 chronic kidney disease, or unspecified chronic kidney disease: Secondary | ICD-10-CM | POA: Diagnosis not present

## 2021-05-25 DIAGNOSIS — C9000 Multiple myeloma not having achieved remission: Secondary | ICD-10-CM | POA: Insufficient documentation

## 2021-05-25 HISTORY — PX: COLONOSCOPY WITH PROPOFOL: SHX5780

## 2021-05-25 SURGERY — COLONOSCOPY WITH PROPOFOL
Anesthesia: General

## 2021-05-25 MED ORDER — PROPOFOL 10 MG/ML IV BOLUS
INTRAVENOUS | Status: DC | PRN
Start: 2021-05-25 — End: 2021-05-25
  Administered 2021-05-25: 10 mg via INTRAVENOUS
  Administered 2021-05-25: 50 mg via INTRAVENOUS
  Administered 2021-05-25: 20 mg via INTRAVENOUS

## 2021-05-25 MED ORDER — MIDAZOLAM HCL 2 MG/2ML IJ SOLN
INTRAMUSCULAR | Status: AC
  Filled 2021-05-25: qty 2

## 2021-05-25 MED ORDER — PROPOFOL 500 MG/50ML IV EMUL
INTRAVENOUS | Status: DC | PRN
  Administered 2021-05-25: 165 ug/kg/min via INTRAVENOUS

## 2021-05-25 MED ORDER — GLYCOPYRROLATE 0.2 MG/ML IJ SOLN
INTRAMUSCULAR | Status: DC | PRN
  Administered 2021-05-25: .2 mg via INTRAVENOUS

## 2021-05-25 MED ORDER — MIDAZOLAM HCL 2 MG/2ML IJ SOLN
INTRAMUSCULAR | Status: DC | PRN
  Administered 2021-05-25: 2 mg via INTRAVENOUS

## 2021-05-25 MED ORDER — LIDOCAINE HCL (CARDIAC) PF 100 MG/5ML IV SOSY
PREFILLED_SYRINGE | INTRAVENOUS | Status: DC | PRN
  Administered 2021-05-25: 80 mg via INTRAVENOUS
  Administered 2021-05-25: 20 mg via INTRAVENOUS

## 2021-05-25 MED ORDER — SODIUM CHLORIDE 0.9 % IV SOLN: INTRAVENOUS | Status: DC

## 2021-05-25 NOTE — Anesthesia Postprocedure Evaluation (Signed)
Anesthesia Post Note  Patient: James House.  Procedure(s) Performed: COLONOSCOPY WITH PROPOFOL  Patient location during evaluation: PACU Anesthesia Type: General Level of consciousness: awake Pain management: satisfactory to patient Vital Signs Assessment: post-procedure vital signs reviewed and stable Respiratory status: nonlabored ventilation and respiratory function stable Cardiovascular status: stable Anesthetic complications: no   No notable events documented.   Last Vitals:  Vitals:   05/25/21 0942 05/25/21 0954  BP: 115/86   Pulse:    Resp:    Temp:  (!) 35.9 C  SpO2:      Last Pain:  Vitals:   05/25/21 0954  TempSrc: Temporal  PainSc: 0-No pain                 VAN STAVEREN,Omelia Marquart

## 2021-05-25 NOTE — Op Note (Signed)
[facility]astroenterology Patient Name: James House Procedure Date: 05/25/2021 9:07 AM MRN: [id number] Account #: [id number] Date of Birth: 19-Nov-1970 Admit Type: Outpatient Age: 51 Room: [facility] ENDO ROOM 2 Gender: Male Note Status: Finalized Instrument Name: Jasper Riling [id number] Procedure:             Colonoscopy Indications:           Screening for colorectal malignant neoplasm, This is                         the patient's first colonoscopy Providers:             Lin Landsman MD, MD Referring MD:          Pleas Koch (Referring MD) Medicines:             General Anesthesia Complications:         No immediate complications. Estimated blood loss: None. Procedure:             Pre-Anesthesia Assessment:                        - Prior to the procedure, a History and Physical was                         performed, and patient medications and allergies were                         reviewed. The patient is competent. The risks and                         benefits of the procedure and the sedation options and                         risks were discussed with the patient. All questions                         were answered and informed consent was obtained.                         Patient identification and proposed procedure were                         verified by the physician, the nurse, the                         anesthesiologist, the anesthetist and the technician                         in the pre-procedure area in the procedure room in the                         endoscopy suite. Mental Status Examination: alert and                         oriented. Airway Examination: normal oropharyngeal                         airway and neck mobility. Respiratory Examination:  clear to auscultation. CV Examination: normal.                         Prophylactic Antibiotics: The patient does not require                         prophylactic  antibiotics. Prior Anticoagulants: The                         patient has taken no previous anticoagulant or                         antiplatelet agents. ASA Grade Assessment: II - A                         patient with mild systemic disease. After reviewing                         the risks and benefits, the patient was deemed in                         satisfactory condition to undergo the procedure. The                         anesthesia plan was to use general anesthesia.                         Immediately prior to administration of medications,                         the patient was re-assessed for adequacy to receive                         sedatives. The heart rate, respiratory rate, oxygen                         saturations, blood pressure, adequacy of pulmonary                         ventilation, and response to care were monitored                         throughout the procedure. The physical status of the                         patient was re-assessed after the procedure.                        After obtaining informed consent, the colonoscope was                         passed under direct vision. Throughout the procedure,                         the patient's blood pressure, pulse, and oxygen                         saturations were monitored continuously. The  Colonoscope was introduced through the anus and                         advanced to the the cecum, identified by appendiceal                         orifice and ileocecal valve. The colonoscopy was                         performed without difficulty. The patient tolerated                         the procedure well. The quality of the bowel                         preparation was evaluated using the BBPS [facility] Bowel                         Preparation Scale) with scores of: Right Colon = 3,                         Transverse Colon = 3 and Left Colon = 3 (entire mucosa                          seen well with no residual staining, small fragments                         of stool or opaque liquid). The total BBPS score                         equals 9. Findings:      The perianal and digital rectal examinations were normal. Pertinent       negatives include normal sphincter tone and no palpable rectal lesions.      The entire examined colon appeared normal.      The retroflexed view of the distal rectum and anal verge was normal and       showed no anal or rectal abnormalities. Impression:            - The entire examined colon is normal.                        - The distal rectum and anal verge are normal on                         retroflexion view.                        - No specimens collected. Recommendation:        - Discharge patient to home (with escort).                        - Resume previous diet today.                        - Continue present medications.                        - Repeat colonoscopy in 10 years  for screening                         purposes. Procedure Code(s):     --- Professional ---                        U8891, Colorectal cancer screening; colonoscopy on                         individual not meeting criteria for high risk Diagnosis Code(s):     --- Professional ---                        Z12.11, Encounter for screening for malignant neoplasm                         of colon CPT copyright 2019 American Medical Association. All rights reserved. The codes documented in this report are preliminary and upon coder review may  be revised to meet current compliance requirements. Dr. Ulyess Mort Lin Landsman MD, MD 05/25/2021 9:30:35 AM This report has been signed electronically. Number of Addenda: 0 Note Initiated On: 05/25/2021 9:07 AM Scope Withdrawal Time: 0 hours 10 minutes 8 seconds  Total Procedure Duration: 0 hours 14 minutes 39 seconds  Estimated Blood Loss:  Estimated blood loss: none.      [facility]

## 2021-05-25 NOTE — Transfer of Care (Signed)
Immediate Anesthesia Transfer of Care Note  Patient: Wetzel Meester.  Procedure(s) Performed: COLONOSCOPY WITH PROPOFOL  Patient Location: Endoscopy Unit  Anesthesia Type:General  Level of Consciousness: awake, drowsy and patient cooperative  Airway & Oxygen Therapy: Patient Spontanous Breathing and Patient connected to face mask oxygen  Post-op Assessment: Report given to RN and Post -op Vital signs reviewed and stable  Post vital signs: Reviewed and stable  Last Vitals:  Vitals Value Taken Time  BP 106/67 05/25/21 0933  Temp    Pulse 93 05/25/21 0933  Resp 12 05/25/21 0933  SpO2 100 % 05/25/21 0933  Vitals shown include unvalidated device data.  Last Pain:  Vitals:   05/25/21 0933  TempSrc:   PainSc: 0-No pain         Complications: No notable events documented.

## 2021-05-25 NOTE — Anesthesia Preprocedure Evaluation (Signed)
Anesthesia Evaluation  Patient identified by MRN, date of birth, ID band Patient awake    Reviewed: Allergy & Precautions, NPO status , Patient's Chart, lab work & pertinent test results  Airway Mallampati: II  TM Distance: >3 FB Neck ROM: Full    Dental  (+) Teeth Intact   Pulmonary neg pulmonary ROS, former smoker,    Pulmonary exam normal breath sounds clear to auscultation       Cardiovascular Exercise Tolerance: Good hypertension, Pt. on medications negative cardio ROS Normal cardiovascular exam Rhythm:Regular Rate:Normal     Neuro/Psych  Headaches, Anxiety negative neurological ROS  negative psych ROS   GI/Hepatic negative GI ROS, Neg liver ROS,   Endo/Other  negative endocrine ROS  Renal/GU negative Renal ROS  negative genitourinary   Musculoskeletal  (+) Arthritis ,   Abdominal Normal abdominal exam  (+)   Peds negative pediatric ROS (+)  Hematology negative hematology ROS (+)   Anesthesia Other Findings Past Medical History: No date: CKD (chronic kidney disease) stage 3, GFR 30-59 ml/min (HCC) No date: Hypertension 08/11/2017: Multiple myeloma ([place]) No date: Neuropathy No date: Pneumonia 08/11/2017: Severe sepsis ([place]) 08/03/2019: Shingles  Past Surgical History: No date: ABDOMINAL SURGERY No date: JOINT REPLACEMENT     Comment:  right knee No date: KNEE SURGERY; Left  BMI    Body Mass Index: 23.49 kg/m      Reproductive/Obstetrics negative OB ROS                             Anesthesia Physical Anesthesia Plan  ASA: 2  Anesthesia Plan: General   Post-op Pain Management:    Induction: Intravenous  PONV Risk Score and Plan: Propofol infusion and TIVA  Airway Management Planned: Natural Airway and Nasal Cannula  Additional Equipment:   Intra-op Plan:   Post-operative Plan:   Informed Consent: I have reviewed the patients History and Physical, chart,  labs and discussed the procedure including the risks, benefits and alternatives for the proposed anesthesia with the patient or authorized representative who has indicated his/her understanding and acceptance.     Dental Advisory Given  Plan Discussed with: CRNA and Surgeon  Anesthesia Plan Comments:         Anesthesia Quick Evaluation

## 2021-05-25 NOTE — Anesthesia Procedure Notes (Signed)
Procedure Name: General with mask airway Date/Time: 05/25/2021 9:12 AM Performed by: Kelton Pillar, CRNA Pre-anesthesia Checklist: Patient identified, Emergency Drugs available, Suction available and Patient being monitored Patient Re-evaluated:Patient Re-evaluated prior to induction Oxygen Delivery Method: Simple face mask Induction Type: IV induction Placement Confirmation: positive ETCO2 and CO2 detector Dental Injury: Teeth and Oropharynx as per pre-operative assessment

## 2021-05-25 NOTE — H&P (Signed)
James Darby, MD [address]  [place]  [place], [place] [address]  Main: [phone]  Fax: [phone] Pager: [phone]  Primary Care Physician:  Pleas Koch, NP Primary Gastroenterologist:  Dr. Cephas House  Pre-Procedure History & Physical: HPI:  [name] is a 51 y.o. male is here for an colonoscopy.   Past Medical History:  Diagnosis Date   CKD (chronic kidney disease) stage 3, GFR 30-59 ml/min (HCC)    Hypertension    Multiple myeloma ([place]) 08/11/2017   Neuropathy    Pneumonia    Severe sepsis ([place]) 08/11/2017   Shingles 08/03/2019    Past Surgical History:  Procedure Laterality Date   ABDOMINAL SURGERY     JOINT REPLACEMENT     right knee   KNEE SURGERY Left     Prior to Admission medications   Medication Sig Start Date End Date Taking? Authorizing Provider  amLODipine (NORVASC) 10 MG tablet Take 1 tablet (10 mg total) by mouth daily. For blood pressure. 05/01/21   Pleas Koch, NP  aspirin EC 81 MG tablet Take 81 mg by mouth daily.    [provider]  baclofen (LIORESAL) 10 MG tablet Take 1 tablet (10 mg total) by mouth 4 (four) times daily as needed for muscle spasms. 04/11/21 04/11/22  Pleas Koch, NP  lenalidomide (REVLIMID) 5 MG capsule Take 1 capsule (5 mg total) by mouth daily. [employer] Dance #  [id number]   Date Obtained1/31/2023 05/22/21   Sindy Guadeloupe, MD  NONFORMULARY OR COMPOUNDED ITEM Sig: Apply 1-2 gm(s) (2-4 pumps) to affected area, 3-4 times/day. (1 pump = 0.5 gm) 03/28/21 06/26/21  Milinda Pointer, MD  olmesartan (BENICAR) 20 MG tablet Take 1 tablet (20 mg total) by mouth daily. For blood pressure. 05/18/21   Pleas Koch, NP  oxyCODONE (OXY IR/ROXICODONE) 5 MG immediate release tablet Take 1 tablet (5 mg total) by mouth every 8 (eight) hours as needed for severe pain. Must last 30 days. 04/05/21 05/05/21  Milinda Pointer, MD  oxyCODONE (OXY IR/ROXICODONE) 5 MG immediate release tablet Take 1 tablet  (5 mg total) by mouth every 8 (eight) hours as needed for severe pain. Must last 30 days. 05/05/21 06/07/21  Milinda Pointer, MD  oxyCODONE (OXY IR/ROXICODONE) 5 MG immediate release tablet Take 1 tablet (5 mg total) by mouth every 8 (eight) hours as needed for severe pain. Must last 30 days. 06/04/21 07/04/21  Milinda Pointer, MD  pregabalin (LYRICA) 150 MG capsule Take 1 capsule (150 mg total) by mouth 3 (three) times daily as needed (for pain). 04/11/21 10/08/21  Pleas Koch, NP  sertraline (ZOLOFT) 25 MG tablet TAKE 1 TABLET BY MOUTH ONCE DAILY 03/19/21 03/19/22  Sindy Guadeloupe, MD  Sod Picosulfate-Mag Ox-Cit Acd [facility]) 10-3.5-12 MG-GM -GM/160ML SOLN Take 1 kit by mouth as directed. At 5 PM evening before procedure, drink 1 bottle of Clenpiq, hydrate, drink (5) 8 oz of water. Then do the same thing 5 hours prior to your procedure. Patient not taking: Reported on 05/18/2021 04/17/21   Lin Landsman, MD    Allergies as of 04/18/2021   (No Known Allergies)    Family History  Problem Relation Age of Onset   Cancer Mother 75       Pancreatic   COPD Mother    Diabetes Mother    Hyperlipidemia Mother    Hypertension Mother    Cancer Maternal Uncle        pancreatic  Cancer Maternal Grandmother 59       colon   COPD Maternal Grandmother    Diabetes Maternal Grandmother    Hypertension Maternal Grandmother     Social History   Socioeconomic History   Marital status: Legally Separated    Spouse name: [name]   Number of children: 3   Years of education: Not on file   Highest education level: Not on file  Occupational History   Not on file  Tobacco Use   Smoking status: Former   Smokeless tobacco: Current    Types: Snuff  Vaping Use   Vaping Use: Never used  Substance and Sexual Activity   Alcohol use: No   Drug use: No   Sexual activity: Yes  Other Topics Concern   Not on file  Social History Narrative   Live in private residence with spouse and mother in  law   Social Determinants of Health   Financial Resource Strain: Not on file  Food Insecurity: Not on file  Transportation Needs: Not on file  Physical Activity: Not on file  Stress: Not on file  Social Connections: Not on file  Intimate Partner Violence: Not on file    Review of Systems: See HPI, otherwise negative ROS  Physical Exam: BP (!) 140/103    Pulse 73    Temp (!) 97.2 F (36.2 C) (Temporal)    Resp 17    Ht _0  (1.702 m)    Wt 68 kg    SpO2 99%    BMI 23.49 kg/m  General:   Alert,  pleasant and cooperative in NAD Head:  Normocephalic and atraumatic. Neck:  Supple; no masses or thyromegaly. Lungs:  Clear throughout to auscultation.    Heart:  Regular rate and rhythm. Abdomen:  Soft, nontender and nondistended. Normal bowel sounds, without guarding, and without rebound.   Neurologic:  Alert and  oriented x4;  grossly normal neurologically.  Impression/Plan: [name] is here for an colonoscopy to be performed for colon cancer screening  Risks, benefits, limitations, and alternatives regarding  colonoscopy have been reviewed with the patient.  Questions have been answered.  All parties agreeable.   Sherri Sear, MD  05/25/2021, 9:05 AM

## 2021-05-28 ENCOUNTER — Encounter: Payer: Self-pay | Admitting: Gastroenterology

## 2021-05-31 ENCOUNTER — Other Ambulatory Visit: Payer: Self-pay

## 2021-06-06 ENCOUNTER — Other Ambulatory Visit: Payer: Self-pay

## 2021-06-07 ENCOUNTER — Other Ambulatory Visit: Payer: Self-pay

## 2021-06-07 ENCOUNTER — Encounter: Payer: Self-pay | Admitting: Oncology

## 2021-06-08 ENCOUNTER — Encounter: Payer: Self-pay | Admitting: Primary Care

## 2021-06-08 ENCOUNTER — Other Ambulatory Visit: Payer: Self-pay

## 2021-06-08 ENCOUNTER — Ambulatory Visit: Payer: BC Managed Care – PPO | Admitting: Primary Care

## 2021-06-08 ENCOUNTER — Telehealth: Payer: Self-pay | Admitting: Radiology

## 2021-06-08 VITALS — BP 132/78 | HR 73 | Temp 97.6°F | Ht 67.0 in | Wt 147.0 lb

## 2021-06-08 DIAGNOSIS — I10 Essential (primary) hypertension: Secondary | ICD-10-CM

## 2021-06-08 DIAGNOSIS — G43709 Chronic migraine without aura, not intractable, without status migrainosus: Secondary | ICD-10-CM | POA: Diagnosis not present

## 2021-06-08 DIAGNOSIS — R519 Headache, unspecified: Secondary | ICD-10-CM

## 2021-06-08 LAB — LIPID PANEL
Cholesterol: 172 mg/dL (ref 0–200)
HDL: 44.8 mg/dL (ref >39.00)
LDL Cholesterol: 109 mg/dL — ABNORMAL HIGH (ref 0–99)
NonHDL: 126.99
Total CHOL/HDL Ratio: 4
Triglycerides: 89 mg/dL (ref 0.0–149.0)
VLDL: 17.8 mg/dL (ref 0.0–40.0)

## 2021-06-08 LAB — BASIC METABOLIC PANEL
BUN: 12 mg/dL (ref 6–23)
CO2: 34 mEq/L — ABNORMAL HIGH (ref 19–32)
Calcium: 9.1 mg/dL (ref 8.4–10.5)
Chloride: 102 mEq/L (ref 96–112)
Creatinine, Ser: 1.42 mg/dL (ref 0.40–1.50)
GFR: 57.63 mL/min — ABNORMAL LOW (ref >60.00)
Glucose, Bld: 36 mg/dL — CL (ref 70–99)
Potassium: 3.2 mEq/L — ABNORMAL LOW (ref 3.5–5.1)
Sodium: 140 mEq/L (ref 135–145)

## 2021-06-08 MED ORDER — TOPIRAMATE 50 MG PO TABS
50.0000 mg | ORAL_TABLET | Freq: Every day | ORAL | 0 refills | Status: DC
  Filled 2021-06-08: qty 90, 90d supply, fill #0

## 2021-06-08 MED ORDER — SUMATRIPTAN SUCCINATE 50 MG PO TABS
ORAL_TABLET | ORAL | 0 refills | Status: DC
  Filled 2021-06-08: qty 10, 30d supply, fill #0

## 2021-06-08 MED ORDER — OLMESARTAN MEDOXOMIL 20 MG PO TABS
20.0000 mg | ORAL_TABLET | Freq: Every day | ORAL | 3 refills | Status: DC
  Filled 2021-06-08: qty 90, 90d supply, fill #0

## 2021-06-08 NOTE — Telephone Encounter (Signed)
[facility] lab called a critical Glucose - 36. Results given to Allie Bossier

## 2021-06-08 NOTE — Assessment & Plan Note (Signed)
Improved and at goal, especially with reading from May 25, 2021.  Continue olmesartan 20 mg daily. Continue amlodipine 10 mg daily.  BMP pending.

## 2021-06-08 NOTE — Assessment & Plan Note (Signed)
Chronic and uncontrolled despite lowering of blood pressure.  Discussed options for treatment, given the frequency of his headaches and migraines we will treat with both abortive and preventative treatment.  Prescription for Topamax 50 mg sent to pharmacy to take nightly for prevention, prescription for sumatriptan 50 mg sent to pharmacy to take as needed for severe migraines.  He will update in 1 month if no improvement.

## 2021-06-08 NOTE — Assessment & Plan Note (Signed)
Persisting despite lowering blood pressure.  He does have a prior history of migraines that date back to childhood, also with signs of photophobia.  Eye exam up-to-date.  Prescription for Topamax 50 mg sent to pharmacy to take at bedtime for migraine prevention.  Prescription for sumatriptan 50 mg sent to pharmacy to use for abortive therapy.  Drowsiness precautions provided.  He will update if no improvement.

## 2021-06-08 NOTE — Telephone Encounter (Signed)
See lab result notes

## 2021-06-08 NOTE — Patient Instructions (Addendum)
Stop by the lab prior to leaving today. I will notify you of your results once received.   Start topiramate (Topamax) 50 mg for headache prevention.  Take 1 tablet by mouth at bedtime.  You may take sumatriptan (Imitrex) 50 mg tablets as needed for severe migraines.  Take 1 tablet by mouth at migraine onset, may repeat 2 hours later if migraine persists.  Do not exceed 2 tablets in 24 hours.  Continue olmesartan 20 mg daily and amlodipine 10 mg daily for blood pressure control.  Please update me in 1 month if your headaches have not improved despite treatment with Topamax.  Update me sooner if you have any issues with the medication.  It was a pleasure to see you today!

## 2021-06-08 NOTE — Progress Notes (Signed)
Subjective:    Patient ID: James Box., male    DOB: 07/04/1970, 51 y.o.   MRN: [id number]  HPI  James House. is a very pleasant 51 y.o. male with a history of hypertension, migraines, chronic back pain, multiple myeloma, chronic pain syndrome who presents today for follow-up of hypertension.  He would also like to discuss chronic headaches/migraines.  He was last evaluated virtually on 05/18/2021 for follow-up of hypertension.  During this visit he was compliant to amlodipine 10 mg daily and checking blood pressures at home which were running in the 140s over 100s.  He endorsed continued headaches to the frontal lobe.  Given his uncontrolled readings, coupled with continued headaches, we added olmesartan 20 mg daily and continue his amlodipine 10 mg daily.  He is here today for follow-up.  Since his last visit he is compliant to olmesartan 20 mg daily and amlodipine 10 mg daily. He continues to experience frontal lobe headaches that are worse with stress.   His headaches are daily, located to bilateral frontal lobes with sensitivity to light. He wears tinted glasses to prevent  photophobia. He will experience migraines 7 times monthly on average. He has migraines that date back to childhood, was never treated. His last eye exam was within the last 2-3 months.   He is managed on sertraline 25 mg daily per his oncologist since November 2022, has noticed improvement and overall feels well managed.  BP Readings from Last 3 Encounters:  06/08/21 132/78  05/25/21 115/86  05/18/21 (!) 142/100      Review of Systems  Eyes:  Positive for photophobia.  Respiratory:  Negative for shortness of breath.   Cardiovascular:  Negative for chest pain.  Neurological:  Positive for headaches.  Psychiatric/Behavioral:  The patient is not nervous/anxious.         Past Medical History:  Diagnosis Date   CKD (chronic kidney disease) stage 3, GFR 30-59 ml/min (HCC)    Hypertension    Multiple  myeloma ([place]) 08/11/2017   Neuropathy    Pneumonia    Severe sepsis ([place]) 08/11/2017   Shingles 08/03/2019    Social History   Socioeconomic History   Marital status: Legally Separated    Spouse name: [name]   Number of children: 3   Years of education: Not on file   Highest education level: Not on file  Occupational History   Not on file  Tobacco Use   Smoking status: Former   Smokeless tobacco: Current    Types: Snuff  Vaping Use   Vaping Use: Never used  Substance and Sexual Activity   Alcohol use: No   Drug use: No   Sexual activity: Yes  Other Topics Concern   Not on file  Social History Narrative   Live in private residence with spouse and mother in law   Social Determinants of Health   Financial Resource Strain: Not on file  Food Insecurity: Not on file  Transportation Needs: Not on file  Physical Activity: Not on file  Stress: Not on file  Social Connections: Not on file  Intimate Partner Violence: Not on file    Past Surgical History:  Procedure Laterality Date   ABDOMINAL SURGERY     COLONOSCOPY WITH PROPOFOL N/A 05/25/2021   Procedure: COLONOSCOPY WITH PROPOFOL;  Surgeon: Lin Landsman, MD;  Location: [facility] ENDOSCOPY;  Service: Gastroenterology;  Laterality: N/A;   JOINT REPLACEMENT     right knee   KNEE SURGERY Left  Family History  Problem Relation Age of Onset   Cancer Mother 67       Pancreatic   COPD Mother    Diabetes Mother    Hyperlipidemia Mother    Hypertension Mother    Cancer Maternal Uncle        pancreatic   Cancer Maternal Grandmother 80       colon   COPD Maternal Grandmother    Diabetes Maternal Grandmother    Hypertension Maternal Grandmother     No Known Allergies  Current Outpatient Medications on File Prior to Visit  Medication Sig Dispense Refill   amLODipine (NORVASC) 10 MG tablet Take 1 tablet (10 mg total) by mouth daily. For blood pressure. 90 tablet 0   aspirin EC 81 MG tablet Take 81 mg by mouth  daily.     baclofen (LIORESAL) 10 MG tablet Take 1 tablet (10 mg total) by mouth 4 (four) times daily as needed for muscle spasms. 360 tablet 0   lenalidomide (REVLIMID) 5 MG capsule Take 1 capsule (5 mg total) by mouth daily. [employer] Auth #  [id number]   Date Obtained1/31/2023 28 capsule 0   NONFORMULARY OR COMPOUNDED ITEM Sig: Apply 1-2 gm(s) (2-4 pumps) to affected area, 3-4 times/day. (1 pump = 0.5 gm) 1 each 2   olmesartan (BENICAR) 20 MG tablet Take 1 tablet (20 mg total) by mouth daily. For blood pressure. 30 tablet 0   oxyCODONE (OXY IR/ROXICODONE) 5 MG immediate release tablet Take 1 tablet (5 mg total) by mouth every 8 (eight) hours as needed for severe pain. Must last 30 days. 90 tablet 0   pregabalin (LYRICA) 150 MG capsule Take 1 capsule (150 mg total) by mouth 3 (three) times daily as needed (for pain). 270 capsule 0   sertraline (ZOLOFT) 25 MG tablet TAKE 1 TABLET BY MOUTH ONCE DAILY 30 tablet 2   oxyCODONE (OXY IR/ROXICODONE) 5 MG immediate release tablet Take 1 tablet (5 mg total) by mouth every 8 (eight) hours as needed for severe pain. Must last 30 days. 90 tablet 0   oxyCODONE (OXY IR/ROXICODONE) 5 MG immediate release tablet Take 1 tablet (5 mg total) by mouth every 8 (eight) hours as needed for severe pain. Must last 30 days. 90 tablet 0   No current facility-administered medications on file prior to visit.    BP 132/78    Pulse 73    Temp 97.6 F (36.4 C) (Oral)    Ht '5\' 7"'  (1.702 m)    Wt 147 lb (66.7 kg)    SpO2 99%    BMI 23.02 kg/m  Objective:   Physical Exam Eyes:     Extraocular Movements: Extraocular movements intact.  Cardiovascular:     Rate and Rhythm: Normal rate and regular rhythm.  Pulmonary:     Effort: Pulmonary effort is normal.     Breath sounds: Normal breath sounds. No wheezing or rales.  Musculoskeletal:     Cervical back: Neck supple.  Skin:    General: Skin is warm and dry.  Neurological:     Mental Status: He is alert and oriented to person,  place, and time.     Cranial Nerves: No cranial nerve deficit.          Assessment & Plan:      This visit occurred during the SARS-CoV-2 public health emergency.  Safety protocols were in place, including screening questions prior to the visit, additional usage of staff PPE, and extensive cleaning of exam room  while observing appropriate contact time as indicated for disinfecting solutions.

## 2021-06-11 ENCOUNTER — Other Ambulatory Visit: Payer: Self-pay

## 2021-06-11 ENCOUNTER — Other Ambulatory Visit (INDEPENDENT_AMBULATORY_CARE_PROVIDER_SITE_OTHER): Payer: BC Managed Care – PPO

## 2021-06-11 DIAGNOSIS — R7309 Other abnormal glucose: Secondary | ICD-10-CM

## 2021-06-11 LAB — GLUCOSE, POCT (MANUAL RESULT ENTRY): POC Glucose: 130 mg/dl — AB (ref 70–99)

## 2021-06-16 ENCOUNTER — Other Ambulatory Visit: Payer: Self-pay | Admitting: *Deleted

## 2021-06-16 ENCOUNTER — Telehealth: Payer: Self-pay | Admitting: *Deleted

## 2021-06-16 MED ORDER — SERTRALINE HCL 25 MG PO TABS
ORAL_TABLET | Freq: Every day | ORAL | 3 refills | Status: DC
  Filled 2021-06-16: qty 30, 30d supply, fill #0
  Filled 2021-07-18: qty 30, 30d supply, fill #1
  Filled 2021-08-21: qty 30, 30d supply, fill #2
  Filled 2021-09-04 – 2021-09-19 (×2): qty 30, 30d supply, fill #3

## 2021-06-16 NOTE — Telephone Encounter (Signed)
Pt called to get refill of zoloft and order put in for refills

## 2021-06-18 ENCOUNTER — Other Ambulatory Visit: Payer: Self-pay | Admitting: *Deleted

## 2021-06-18 ENCOUNTER — Other Ambulatory Visit: Payer: Self-pay

## 2021-06-18 NOTE — Progress Notes (Deleted)
No show to appointment.

## 2021-06-19 ENCOUNTER — Encounter: Payer: Self-pay | Admitting: Oncology

## 2021-06-19 MED ORDER — LENALIDOMIDE 5 MG PO CAPS: 5.0000 mg | ORAL_CAPSULE | Freq: Every day | ORAL | 0 refills | Status: DC

## 2021-06-20 ENCOUNTER — Other Ambulatory Visit: Payer: Self-pay | Admitting: Pain Medicine

## 2021-06-20 ENCOUNTER — Encounter: Payer: No Typology Code available for payment source | Admitting: Pain Medicine

## 2021-06-20 DIAGNOSIS — Z91199 Patient's noncompliance with other medical treatment and regimen due to unspecified reason: Secondary | ICD-10-CM

## 2021-06-20 NOTE — Progress Notes (Signed)
The patient had an appointment today (06/20/2021) for medication management which he did not keep.  In addition to the 06/20/2021 appointment, he also did not keep his appointment on 11/29/2020 or 10/10/2020.  His last UDS on 12/04/2020 came back abnormal positive for carboxy-THC (marijuana).  On 03/28/2021 he had been given 3 prescriptions for oxycodone IR 5 mg tablets to be taken 1 tablet p.o. 3 times daily.  The last 1 of these 3 prescriptions was filled on 06/07/2021 which means that he will be running out on 07/07/2021. ?

## 2021-06-20 NOTE — Patient Instructions (Incomplete)
____________________________________________________________________________________________  CBD (cannabidiol) & Delta-8 (Delta-8 tetrahydrocannabinol) WARNING  Intro: Cannabidiol (CBD) and tetrahydrocannabinol (THC), are two natural compounds found in plants of the Cannabis genus. They can both be extracted from hemp or cannabis. Hemp and cannabis come from the Cannabis sativa plant. Both compounds interact with your bodys endocannabinoid system, but they have very different effects. CBD does not produce the high sensation associated with cannabis. Delta-8 tetrahydrocannabinol, also known as delta-8 THC, is a psychoactive substance found in the Cannabis sativa plant, of which marijuana and hemp are two varieties. THC is responsible for the high associated with the illicit use of marijuana.  Applicable to: All individuals currently taking or considering taking CBD (cannabidiol) and, more important, all patients taking opioid analgesic controlled substances (pain medication). (Example: oxycodone; oxymorphone; hydrocodone; hydromorphone; morphine; methadone; tramadol; tapentadol; fentanyl; buprenorphine; butorphanol; dextromethorphan; meperidine; codeine; etc.)  Legal status: CBD remains a Schedule I drug prohibited for any use. CBD is illegal with one exception. In the [place], CBD has a limited Transport planner (FDA) approval for the treatment of two specific types of epilepsy disorders. Only one CBD product has been approved by the FDA for this purpose: "Epidiolex". FDA is aware that some companies are marketing products containing cannabis and cannabis-derived compounds in ways that violate the [employer], Drug and Cosmetic Act [facility] Act) and that may put the health and safety of consumers at risk. The FDA, a Federal agency, has not enforced the CBD status since 2018. UPDATE: (06/08/2021) The Drug Enforcement Agency ([place]) issued a letter stating that "delta" cannabinoids, including  Delta-8-THCO and Delta-9-THCO, synthetically derived from hemp do not qualify as hemp and will be viewed as Schedule I drugs. (Schedule I drugs, substances, or chemicals are defined as drugs with no currently accepted medical use and a high potential for abuse. Some examples of Schedule I drugs are: heroin, lysergic acid diethylamide (LSD), marijuana (cannabis), 3,4-methylenedioxymethamphetamine (ecstasy), methaqualone, and peyote.) (https://jennings.com/)  Legality: Some manufacturers ship CBD products nationally, which is illegal. Often such products are sold online and are therefore available throughout the country. CBD is openly sold in head shops and health food stores in some states where such sales have not been explicitly legalized. Selling unapproved products with unsubstantiated therapeutic claims is not only a violation of the law, but also can put patients at risk, as these products have not been proven to be safe or effective. Federal illegality makes it difficult to conduct research on CBD.  Reference: "FDA Regulation of Cannabis and Cannabis-Derived Products, Including Cannabidiol (CBD)" - SeekArtists.com.pt  Warning: CBD is not FDA approved and has not undergo the same manufacturing controls as prescription drugs.  This means that the purity and safety of available CBD may be questionable. Most of the time, despite manufacturer's claims, it is contaminated with THC (delta-9-tetrahydrocannabinol - the chemical in marijuana responsible for the "HIGH").  When this is the case, the [facility] contaminant will trigger a positive urine drug screen (UDS) test for Marijuana (carboxy-THC). Because a positive UDS for any illicit substance is a violation of our medication agreement, your opioid analgesics (pain medicine) may be permanently discontinued. The FDA recently put out a warning about 5 things  that everyone should be aware of regarding Delta-8 THC: Delta-8 THC products have not been evaluated or approved by the FDA for safe use and may be marketed in ways that put the public health at risk. The FDA has received adverse event reports involving delta-8 THC-containing products. Delta-8 THC has psychoactive  and intoxicating effects. Delta-8 THC manufacturing often involve use of potentially harmful chemicals to create the concentrations of delta-8 THC claimed in the marketplace. The final delta-8 THC product may have potentially harmful by-products (contaminants) due to the chemicals used in the process. Manufacturing of delta-8 THC products may occur in uncontrolled or unsanitary settings, which may lead to the presence of unsafe contaminants or other potentially harmful substances. Delta-8 THC products should be kept out of the reach of children and pets.  MORE ABOUT CBD  General Information: CBD was discovered in 21 and it is a derivative of the cannabis sativa genus plants (Marijuana and Hemp). It is one of the 113 identified substances found in Marijuana. It accounts for up to 40% of the plant's extract. As of 2018, preliminary clinical studies on CBD included research for the treatment of anxiety, movement disorders, and pain. CBD is available and consumed in multiple forms, including inhalation of smoke or vapor, as an aerosol spray, and by mouth. It may be supplied as an oil containing CBD, capsules, dried cannabis, or as a liquid solution. CBD is thought not to be as psychoactive as THC (delta-9-tetrahydrocannabinol - the chemical in marijuana responsible for the "HIGH"). Studies suggest that CBD may interact with different biological target receptors in the body, including cannabinoid and other neurotransmitter receptors. As of 2018 the mechanism of action for its biological effects has not been determined.  Side-effects   Adverse reactions: Dry mouth, diarrhea, decreased appetite,  fatigue, drowsiness, malaise, weakness, sleep disturbances, and others.  Drug interactions: CBC may interact with other medications such as blood-thinners. Because CBD causes drowsiness on its own, it also increases the drowsiness caused by other medications, including antihistamines (such as Benadryl), benzodiazepines (Xanax, Ativan, Valium), antipsychotics, antidepressants and opioids, as well as alcohol and supplements such as kava, melatonin and [place]'s Wort. Be cautious with the following combinations:   Brivaracetam (Briviact) Brivaracetam is changed and broken down by the body. CBD might decrease how quickly the body breaks down brivaracetam. This might increase levels of brivaracetam in the body.  Caffeine Caffeine is changed and broken down by the body. CBD might decrease how quickly the body breaks down caffeine. This might increase levels of caffeine in the body.  Carbamazepine (Tegretol) Carbamazepine is changed and broken down by the body. CBD might decrease how quickly the body breaks down carbamazepine. This might increase levels of carbamazepine in the body and increase its side effects.  Citalopram (Celexa) Citalopram is changed and broken down by the body. CBD might decrease how quickly the body breaks down citalopram. This might increase levels of citalopram in the body and increase its side effects.  Clobazam (Onfi) Clobazam is changed and broken down by the liver. CBD might decrease how quickly the liver breaks down clobazam. This might increase the effects and side effects of clobazam.  Eslicarbazepine (Aptiom) Eslicarbazepine is changed and broken down by the body. CBD might decrease how quickly the body breaks down eslicarbazepine. This might increase levels of eslicarbazepine in the body by a small amount.  Everolimus (Zostress) Everolimus is changed and broken down by the body. CBD might decrease how quickly the body breaks down everolimus. This might increase  levels of everolimus in the body.  Lithium Taking higher doses of CBD might increase levels of lithium. This can increase the risk of lithium toxicity.  Medications changed by the liver (Cytochrome P450 1A1 (CYP1A1) substrates) Some medications are changed and broken down by the liver. CBD might change  how quickly the liver breaks down these medications. This could change the effects and side effects of these medications.  Medications changed by the liver (Cytochrome P450 1A2 (CYP1A2) substrates) Some medications are changed and broken down by the liver. CBD might change how quickly the liver breaks down these medications. This could change the effects and side effects of these medications.  Medications changed by the liver (Cytochrome P450 1B1 (CYP1B1) substrates) Some medications are changed and broken down by the liver. CBD might change how quickly the liver breaks down these medications. This could change the effects and side effects of these medications.  Medications changed by the liver (Cytochrome P450 2A6 (CYP2A6) substrates) Some medications are changed and broken down by the liver. CBD might change how quickly the liver breaks down these medications. This could change the effects and side effects of these medications.  Medications changed by the liver (Cytochrome P450 2B6 (CYP2B6) substrates) Some medications are changed and broken down by the liver. CBD might change how quickly the liver breaks down these medications. This could change the effects and side effects of these medications.  Medications changed by the liver (Cytochrome P450 2C19 (CYP2C19) substrates) Some medications are changed and broken down by the liver. CBD might change how quickly the liver breaks down these medications. This could change the effects and side effects of these medications.  Medications changed by the liver (Cytochrome P450 2C8 (CYP2C8) substrates) Some medications are changed and broken down by the  liver. CBD might change how quickly the liver breaks down these medications. This could change the effects and side effects of these medications.  Medications changed by the liver (Cytochrome P450 2C9 (CYP2C9) substrates) Some medications are changed and broken down by the liver. CBD might change how quickly the liver breaks down these medications. This could change the effects and side effects of these medications.  Medications changed by the liver (Cytochrome P450 2D6 (CYP2D6) substrates) Some medications are changed and broken down by the liver. CBD might change how quickly the liver breaks down these medications. This could change the effects and side effects of these medications.  Medications changed by the liver (Cytochrome P450 2E1 (CYP2E1) substrates) Some medications are changed and broken down by the liver. CBD might change how quickly the liver breaks down these medications. This could change the effects and side effects of these medications.  Medications changed by the liver (Cytochrome P450 3A4 (CYP3A4) substrates) Some medications are changed and broken down by the liver. CBD might change how quickly the liver breaks down these medications. This could change the effects and side effects of these medications.  Medications changed by the liver (Glucuronidated drugs) Some medications are changed and broken down by the liver. CBD might change how quickly the liver breaks down these medications. This could change the effects and side effects of these medications.  Medications that decrease the breakdown of other medications by the liver (Cytochrome P450 2C19 (CYP2C19) inhibitors) CBD is changed and broken down by the liver. Some drugs decrease how quickly the liver changes and breaks down CBD. This could change the effects and side effects of CBD.  Medications that decrease the breakdown of other medications in the liver (Cytochrome P450 3A4 (CYP3A4) inhibitors) CBD is changed and  broken down by the liver. Some drugs decrease how quickly the liver changes and breaks down CBD. This could change the effects and side effects of CBD.  Medications that increase breakdown of other medications by the liver (Cytochrome P450  3A4 (CYP3A4) inducers) CBD is changed and broken down by the liver. Some drugs increase how quickly the liver changes and breaks down CBD. This could change the effects and side effects of CBD.  Medications that increase the breakdown of other medications by the liver (Cytochrome P450 2C19 (CYP2C19) inducers) CBD is changed and broken down by the liver. Some drugs increase how quickly the liver changes and breaks down CBD. This could change the effects and side effects of CBD.  Methadone (Dolophine) Methadone is broken down by the liver. CBD might decrease how quickly the liver breaks down methadone. Taking cannabidiol along with methadone might increase the effects and side effects of methadone.  Rufinamide (Banzel) Rufinamide is changed and broken down by the body. CBD might decrease how quickly the body breaks down rufinamide. This might increase levels of rufinamide in the body by a small amount.  Sedative medications (CNS depressants) CBD might cause sleepiness and slowed breathing. Some medications, called sedatives, can also cause sleepiness and slowed breathing. Taking CBD with sedative medications might cause breathing problems and/or too much sleepiness.  Sirolimus (Rapamune) Sirolimus is changed and broken down by the body. CBD might decrease how quickly the body breaks down sirolimus. This might increase levels of sirolimus in the body.  Stiripentol (Diacomit) Stiripentol is changed and broken down by the body. CBD might decrease how quickly the body breaks down stiripentol. This might increase levels of stiripentol in the body and increase its side effects.  Tacrolimus (Prograf) Tacrolimus is changed and broken down by the body. CBD might  decrease how quickly the body breaks down tacrolimus. This might increase levels of tacrolimus in the body.  Tamoxifen (Soltamox) Tamoxifen is changed and broken down by the body. CBD might affect how quickly the body breaks down tamoxifen. This might affect levels of tamoxifen in the body.  Topiramate (Topamax) Topiramate is changed and broken down by the body. CBD might decrease how quickly the body breaks down topiramate. This might increase levels of topiramate in the body by a small amount.  Valproate Valproic acid can cause liver injury. Taking cannabidiol with valproic acid might increase the chance of liver injury. CBD and/or valproic acid might need to be stopped, or the dose might need to be reduced.  Warfarin (Coumadin) CBD might increase levels of warfarin, which can increase the risk for bleeding. CBD and/or warfarin might need to be stopped, or the dose might need to be reduced.  Zonisamide Zonisamide is changed and broken down by the body. CBD might decrease how quickly the body breaks down zonisamide. This might increase levels of zonisamide in the body by a small amount. (Last update: 06/20/2021) ____________________________________________________________________________________________  ____________________________________________________________________________________________  Medication Rules  Purpose: To inform patients, and their family members, of our rules and regulations.  Applies to: All patients receiving prescriptions (written or electronic).  Pharmacy of record: Pharmacy where electronic prescriptions will be sent. If written prescriptions are taken to a different pharmacy, please inform the nursing staff. The pharmacy listed in the electronic medical record should be the one where you would like electronic prescriptions to be sent.  Electronic prescriptions: In compliance with the [place] (STOP) Act of 2017  (Session Lanny Cramp [phone]), effective April 22, 2018, all controlled substances must be electronically prescribed. Calling prescriptions to the pharmacy will cease to exist.  Prescription refills: Only during scheduled appointments. Applies to all prescriptions.  NOTE: The following applies primarily to controlled substances (Opioid* Pain Medications).  Type of encounter (visit): For patients receiving controlled substances, face-to-face visits are required. (Not an option or up to the patient.)  Patient's responsibilities: Pain Pills: Bring all pain pills to every appointment (except for procedure appointments). Pill Bottles: Bring pills in original pharmacy bottle. Always bring the newest bottle. Bring bottle, even if empty. Medication refills: You are responsible for knowing and keeping track of what medications you take and those you need refilled. The day before your appointment: write a list of all prescriptions that need to be refilled. The day of the appointment: give the list to the admitting nurse. Prescriptions will be written only during appointments. No prescriptions will be written on procedure days. If you forget a medication: it will not be "Called in", "Faxed", or "electronically sent". You will need to get another appointment to get these prescribed. No early refills. Do not call asking to have your prescription filled early. Prescription Accuracy: You are responsible for carefully inspecting your prescriptions before leaving our office. Have the discharge nurse carefully go over each prescription with you, before taking them home. Make sure that your name is accurately spelled, that your address is correct. Check the name and dose of your medication to make sure it is accurate. Check the number of pills, and the written instructions to make sure they are clear and accurate. Make sure that you are given enough medication to last until your next medication refill  appointment. Taking Medication: Take medication as prescribed. When it comes to controlled substances, taking less pills or less frequently than prescribed is permitted and encouraged. Never take more pills than instructed. Never take medication more frequently than prescribed.  Inform other Doctors: Always inform, all of your healthcare providers, of all the medications you take. Pain Medication from other Providers: You are not allowed to accept any additional pain medication from any other Doctor or Healthcare provider. There are two exceptions to this rule. (see below) In the event that you require additional pain medication, you are responsible for notifying us, as stated below. Cough Medicine: Often these contain an opioid, such as codeine or hydrocodone. Never accept or take cough medicine containing these opioids if you are already taking an opioid* medication. The combination may cause respiratory failure and death. Medication Agreement: You are responsible for carefully reading and following our Medication Agreement. This must be signed before receiving any prescriptions from our practice. Safely store a copy of your signed Agreement. Violations to the Agreement will result in no further prescriptions. (Additional copies of our Medication Agreement are available upon request.) Laws, Rules, & Regulations: All patients are expected to follow all Federal and [employer], [employer], Rules, [facility]. Ignorance of the Laws does not constitute a valid excuse.  Illegal drugs and Controlled Substances: The use of illegal substances (including, but not limited to marijuana and its derivatives) and/or the illegal use of any controlled substances is strictly prohibited. Violation of this rule may result in the immediate and permanent discontinuation of any and all prescriptions being written by our practice. The use of any illegal substances is prohibited. Adopted CDC guidelines & recommendations: Target  dosing levels will be at or below 60 MME/day. Use of benzodiazepines** is not recommended.  Exceptions: There are only two exceptions to the rule of not receiving pain medications from other Healthcare Providers. Exception #1 (Emergencies): In the event of an emergency (i.e.: accident requiring emergency care), you are allowed to receive additional pain medication. However, you are responsible for: As soon as  you are able, call our office [phone], at any time of the day or night, and leave a message stating your name, the date and nature of the emergency, and the name and dose of the medication prescribed. In the event that your call is answered by a member of our staff, make sure to document and save the date, time, and the name of the person that took your information.  Exception #2 (Planned Surgery): In the event that you are scheduled by another doctor or dentist to have any type of surgery or procedure, you are allowed (for a period no longer than 30 days), to receive additional pain medication, for the acute post-op pain. However, in this case, you are responsible for picking up a copy of our "Post-op Pain Management for Surgeons" handout, and giving it to your surgeon or dentist. This document is available at our office, and does not require an appointment to obtain it. Simply go to our office during business hours (Monday-Thursday from 8:00 AM to 4:00 PM) (Friday 8:00 AM to 12:00 Noon) or if you have a scheduled appointment with [place], prior to your surgery, and ask for it by name. In addition, you are responsible for: calling our office [phone], at any time of the day or night, and leaving a message stating your name, name of your surgeon, type of surgery, and date of procedure or surgery. Failure to comply with your responsibilities may result in termination of therapy involving the controlled substances. Medication Agreement Violation. Following the above rules, including your  responsibilities will help you in avoiding a Medication Agreement Violation (Breaking your Pain Medication Contract).  *Opioid medications include: morphine, codeine, oxycodone, oxymorphone, hydrocodone, hydromorphone, meperidine, tramadol, tapentadol, buprenorphine, fentanyl, methadone. **Benzodiazepine medications include: diazepam (Valium), alprazolam (Xanax), clonazepam (Klonopine), lorazepam (Ativan), clorazepate (Tranxene), chlordiazepoxide (Librium), estazolam (Prosom), oxazepam (Serax), temazepam (Restoril), triazolam (Halcion) (Last updated: 01/17/2021) ____________________________________________________________________________________________  ____________________________________________________________________________________________  Medication Recommendations and Reminders  Applies to: All patients receiving prescriptions (written and/or electronic).  Medication Rules & Regulations: These rules and regulations exist for your safety and that of others. They are not flexible and neither are we. Dismissing or ignoring them will be considered "non-compliance" with medication therapy, resulting in complete and irreversible termination of such therapy. (See document titled "Medication Rules" for more details.) In all conscience, because of safety reasons, we cannot continue providing a therapy where the patient does not follow instructions.  Pharmacy of record:  Definition: This is the pharmacy where your electronic prescriptions will be sent.  We do not endorse any particular pharmacy, however, we have experienced problems with [facility] not securing enough medication supply for the community. We do not restrict you in your choice of pharmacy. However, once we write for your prescriptions, we will NOT be re-sending more prescriptions to fix restricted supply problems created by your pharmacy, or your insurance.  The pharmacy listed in the electronic medical record should be the one where  you want electronic prescriptions to be sent. If you choose to change pharmacy, simply notify our nursing staff.  Recommendations: Keep all of your pain medications in a safe place, under lock and key, even if you live alone. We will NOT replace lost, stolen, or damaged medication. After you fill your prescription, take 1 week's worth of pills and put them away in a safe place. You should keep a separate, properly labeled bottle for this purpose. The remainder should be kept in the original bottle. Use this as your primary supply, until  it runs out. Once it's gone, then you know that you have 1 week's worth of medicine, and it is time to come in for a prescription refill. If you do this correctly, it is unlikely that you will ever run out of medicine. To make sure that the above recommendation works, it is very important that you make sure your medication refill appointments are scheduled at least 1 week before you run out of medicine. To do this in an effective manner, make sure that you do not leave the office without scheduling your next medication management appointment. Always ask the nursing staff to show you in your prescription , when your medication will be running out. Then arrange for the receptionist to get you a return appointment, at least 7 days before you run out of medicine. Do not wait until you have 1 or 2 pills left, to come in. This is very poor planning and does not take into consideration that we may need to cancel appointments due to bad weather, sickness, or emergencies affecting our staff. DO NOT ACCEPT A "Partial Fill": If for any reason your pharmacy does not have enough pills/tablets to completely fill or refill your prescription, do not allow for a "partial fill". The law allows the pharmacy to complete that prescription within 72 hours, without requiring a new prescription. If they do not fill the rest of your prescription within those 72 hours, you will need a separate  prescription to fill the remaining amount, which we will NOT provide. If the reason for the partial fill is your insurance, you will need to talk to the pharmacist about payment alternatives for the remaining tablets, but again, DO NOT ACCEPT A PARTIAL FILL, unless you can trust your pharmacist to obtain the remainder of the pills within 72 hours.  Prescription refills and/or changes in medication(s):  Prescription refills, and/or changes in dose or medication, will be conducted only during scheduled medication management appointments. (Applies to both, written and electronic prescriptions.) No refills on procedure days. No medication will be changed or started on procedure days. No changes, adjustments, and/or refills will be conducted on a procedure day. Doing so will interfere with the diagnostic portion of the procedure. No phone refills. No medications will be "called into the pharmacy". No Fax refills. No weekend refills. No Holliday refills. No after hours refills.  Remember:  Business hours are:  Monday to Thursday 8:00 AM to 4:00 PM Provider's Schedule: Milinda Pointer, MD - Appointments are:  Medication management: Monday and Wednesday 8:00 AM to 4:00 PM Procedure day: Tuesday and Thursday 7:30 AM to 4:00 PM Gillis Santa, MD - Appointments are:  Medication management: Tuesday and Thursday 8:00 AM to 4:00 PM Procedure day: Monday and Wednesday 7:30 AM to 4:00 PM (Last update: 11/10/2019) ____________________________________________________________________________________________  ____________________________________________________________________________________________  Drug Holidays (Slow)  What is a "Drug Holiday"? Drug Holiday: is the name given to the period of time during which a patient stops taking a medication(s) for the purpose of eliminating tolerance to the drug.  Benefits Improved effectiveness of opioids. Decreased opioid dose needed to achieve  benefits. Improved pain with lesser dose.  What is tolerance? Tolerance: is the progressive decreased in effectiveness of a drug due to its repetitive use. With repetitive use, the body gets use to the medication and as a consequence, it loses its effectiveness. This is a common problem seen with opioid pain medications. As a result, a larger dose of the drug is needed to achieve the same effect  that used to be obtained with a smaller dose.  How long should a "Drug Holiday" last? You should stay off of the pain medicine for at least 14 consecutive days. (2 weeks)  Should I stop the medicine "cold [place]"? No. You should always coordinate with your Pain Specialist so that he/she can provide you with the correct medication dose to make the transition as smoothly as possible.  How do I stop the medicine? Slowly. You will be instructed to decrease the daily amount of pills that you take by one (1) pill every seven (7) days. This is called a "slow downward taper" of your dose. For example: if you normally take four (4) pills per day, you will be asked to drop this dose to three (3) pills per day for seven (7) days, then to two (2) pills per day for seven (7) days, then to one (1) per day for seven (7) days, and at the end of those last seven (7) days, this is when the "Drug Holiday" would start.   Will I have withdrawals? By doing a "slow downward taper" like this one, it is unlikely that you will experience any significant withdrawal symptoms. Typically, what triggers withdrawals is the sudden stop of a high dose opioid therapy. Withdrawals can usually be avoided by slowly decreasing the dose over a prolonged period of time. If you do not follow these instructions and decide to stop your medication abruptly, withdrawals may be possible.  What are withdrawals? Withdrawals: refers to the wide range of symptoms that occur after stopping or dramatically reducing opiate drugs after heavy and prolonged use.  Withdrawal symptoms do not occur to patients that use low dose opioids, or those who take the medication sporadically. Contrary to benzodiazepine (example: Valium, Xanax, etc.) or alcohol withdrawals (Delirium Tremens), opioid withdrawals are not lethal. Withdrawals are the physical manifestation of the body getting rid of the excess receptors.  Expected Symptoms Early symptoms of withdrawal may include: Agitation Anxiety Muscle aches Increased tearing Insomnia Runny nose Sweating Yawning  Late symptoms of withdrawal may include: Abdominal cramping Diarrhea Dilated pupils Goose bumps Nausea Vomiting  Will I experience withdrawals? Due to the slow nature of the taper, it is very unlikely that you will experience any.  What is a slow taper? Taper: refers to the gradual decrease in dose.  (Last update: 11/10/2019) ____________________________________________________________________________________________

## 2021-07-10 ENCOUNTER — Other Ambulatory Visit: Payer: Self-pay | Admitting: Primary Care

## 2021-07-10 ENCOUNTER — Other Ambulatory Visit: Payer: Self-pay

## 2021-07-10 DIAGNOSIS — M792 Neuralgia and neuritis, unspecified: Secondary | ICD-10-CM

## 2021-07-10 DIAGNOSIS — G620 Drug-induced polyneuropathy: Secondary | ICD-10-CM

## 2021-07-10 MED FILL — Pregabalin Cap 150 MG: ORAL | 90 days supply | Qty: 270 | Fill #0 | Status: AC

## 2021-07-11 ENCOUNTER — Other Ambulatory Visit: Payer: Self-pay | Admitting: *Deleted

## 2021-07-11 ENCOUNTER — Other Ambulatory Visit: Payer: Self-pay

## 2021-07-17 ENCOUNTER — Other Ambulatory Visit: Payer: Self-pay

## 2021-07-17 MED ORDER — LENALIDOMIDE 5 MG PO CAPS: 5.0000 mg | ORAL_CAPSULE | Freq: Every day | ORAL | 0 refills | Status: DC

## 2021-07-17 MED ORDER — LENALIDOMIDE 5 MG PO CAPS
5.0000 mg | ORAL_CAPSULE | Freq: Every day | ORAL | 0 refills | Status: DC
  Filled 2021-07-17: qty 28, 28d supply, fill #0

## 2021-07-18 ENCOUNTER — Other Ambulatory Visit: Payer: Self-pay | Admitting: *Deleted

## 2021-07-18 ENCOUNTER — Other Ambulatory Visit: Payer: Self-pay

## 2021-07-18 ENCOUNTER — Encounter: Payer: Self-pay | Admitting: Oncology

## 2021-07-20 ENCOUNTER — Other Ambulatory Visit: Payer: Self-pay | Admitting: *Deleted

## 2021-07-20 ENCOUNTER — Telehealth: Payer: Self-pay | Admitting: *Deleted

## 2021-07-20 MED ORDER — LENALIDOMIDE 5 MG PO CAPS: 5.0000 mg | ORAL_CAPSULE | Freq: Every day | ORAL | 0 refills | Status: DC

## 2021-07-20 NOTE — Telephone Encounter (Signed)
[facility] called [facility] to get refill for pt for revlimid. It was ordered but it was printed instead of going through electonically. I tried to put it in electronically and it still turned to print. The rx does not print. I called and spoke to [facility] that sent me to [facility]igail and then she trf me to the pharmacy  and spoeke to Fritz Pickerel who took verbal order and they will send it out to pt ?

## 2021-08-17 ENCOUNTER — Other Ambulatory Visit: Payer: Self-pay | Admitting: *Deleted

## 2021-08-21 ENCOUNTER — Other Ambulatory Visit: Payer: Self-pay

## 2021-08-26 ENCOUNTER — Other Ambulatory Visit: Payer: Self-pay | Admitting: *Deleted

## 2021-08-26 MED ORDER — LENALIDOMIDE 5 MG PO CAPS: 5.0000 mg | ORAL_CAPSULE | Freq: Every day | ORAL | 0 refills | Status: DC

## 2021-08-31 ENCOUNTER — Encounter: Payer: Self-pay | Admitting: Oncology

## 2021-09-04 ENCOUNTER — Inpatient Hospital Stay: Payer: BC Managed Care – PPO | Attending: Oncology

## 2021-09-04 ENCOUNTER — Other Ambulatory Visit: Payer: Self-pay

## 2021-09-04 ENCOUNTER — Encounter: Payer: Self-pay | Admitting: Oncology

## 2021-09-04 ENCOUNTER — Inpatient Hospital Stay (HOSPITAL_BASED_OUTPATIENT_CLINIC_OR_DEPARTMENT_OTHER): Payer: BC Managed Care – PPO | Admitting: Oncology

## 2021-09-04 VITALS — BP 161/97 | HR 67 | Temp 97.2°F | Resp 18 | Wt 150.2 lb

## 2021-09-04 DIAGNOSIS — C9001 Multiple myeloma in remission: Secondary | ICD-10-CM

## 2021-09-04 DIAGNOSIS — G620 Drug-induced polyneuropathy: Secondary | ICD-10-CM | POA: Diagnosis not present

## 2021-09-04 DIAGNOSIS — T451X5A Adverse effect of antineoplastic and immunosuppressive drugs, initial encounter: Secondary | ICD-10-CM | POA: Insufficient documentation

## 2021-09-04 DIAGNOSIS — Z87891 Personal history of nicotine dependence: Secondary | ICD-10-CM | POA: Diagnosis not present

## 2021-09-04 DIAGNOSIS — Z79899 Other long term (current) drug therapy: Secondary | ICD-10-CM

## 2021-09-04 DIAGNOSIS — Z800 Family history of malignant neoplasm of digestive organs: Secondary | ICD-10-CM | POA: Insufficient documentation

## 2021-09-04 LAB — CBC WITH DIFFERENTIAL/PLATELET
Abs Immature Granulocytes: 0.01 10*3/uL (ref 0.00–0.07)
Basophils Absolute: 0.1 10*3/uL (ref 0.0–0.1)
Basophils Relative: 1 %
Eosinophils Absolute: 0.3 10*3/uL (ref 0.0–0.5)
Eosinophils Relative: 7 %
HCT: 46.6 % (ref 39.0–52.0)
Hemoglobin: 16.1 g/dL (ref 13.0–17.0)
Immature Granulocytes: 0 %
Lymphocytes Relative: 28 %
Lymphs Abs: 1.1 10*3/uL (ref 0.7–4.0)
MCH: 31.8 pg (ref 26.0–34.0)
MCHC: 34.5 g/dL (ref 30.0–36.0)
MCV: 91.9 fL (ref 80.0–100.0)
Monocytes Absolute: 0.4 10*3/uL (ref 0.1–1.0)
Monocytes Relative: 11 %
Neutro Abs: 2.2 10*3/uL (ref 1.7–7.7)
Neutrophils Relative %: 53 %
Platelets: 185 10*3/uL (ref 150–400)
RBC: 5.07 MIL/uL (ref 4.22–5.81)
RDW: 13.4 % (ref 11.5–15.5)
WBC: 4.1 10*3/uL (ref 4.0–10.5)
nRBC: 0 % (ref 0.0–0.2)

## 2021-09-04 LAB — COMPREHENSIVE METABOLIC PANEL
ALT: 24 U/L (ref 0–44)
AST: 27 U/L (ref 15–41)
Albumin: 4.2 g/dL (ref 3.5–5.0)
Alkaline Phosphatase: 110 U/L (ref 38–126)
Anion gap: 9 (ref 5–15)
BUN: 18 mg/dL (ref 6–20)
CO2: 25 mmol/L (ref 22–32)
Calcium: 9.1 mg/dL (ref 8.9–10.3)
Chloride: 104 mmol/L (ref 98–111)
Creatinine, Ser: 1.51 mg/dL — ABNORMAL HIGH (ref 0.61–1.24)
GFR, Estimated: 56 mL/min — ABNORMAL LOW (ref >60)
Glucose, Bld: 126 mg/dL — ABNORMAL HIGH (ref 70–99)
Potassium: 3.7 mmol/L (ref 3.5–5.1)
Sodium: 138 mmol/L (ref 135–145)
Total Bilirubin: 0.8 mg/dL (ref 0.3–1.2)
Total Protein: 7.1 g/dL (ref 6.5–8.1)

## 2021-09-04 NOTE — Progress Notes (Signed)
? ? ? ?Hematology/Oncology Consult note ?[place]  ?[phone] [phone] ? ?Patient Care Team: ?Pleas Koch, NP as PCP - General (Internal Medicine) ?Vella Redhead, MD as PCP - Hematology/Oncology ?Sindy Guadeloupe, MD as Consulting Physician (Hematology and Oncology)  ? ?Name of the patient: James House  ?[id number]  ?1970/08/08  ? ?Date of visit: 09/04/21 ? ?Diagnosis- light chain kappa multiple myeloma s/p ASCT currently in remission. Patient on maintenance revlimid ? ?Chief complaint/ Reason for visit-routine follow-up of multiple myeloma in remission on low-dose Revlimid ? ?Heme/Onc history: Patient is a 50 year old male with a history of kappa light chain myeloma that was treated by [name].  History is as follows: ?  ?1.  Patient presented with renal insufficiency with a creatinine of 2.4 in April 2018.  Kidney biopsy showed myeloma cast nephropathy.  Kappa light chain was 3004 and lambda 0.22 with a kappa lambda ratio of 13,000 655.  Skeletal survey showed multiple calvarial and marrow lesions.  Bone marrow biopsy in May 2018 showed 43% plasma cells.  He received CyBorD for cycle 1 in May 2018 and was subsequently switched to RVD.  He received 4 cycles followed by a repeat bone marrow biopsy in August 2018 which showed no increase in plasma cells. ?  ?2.  He underwent autologous stem cell transplantation on 01/30/2017.  He was started on Revlimid maintenance 10 mg daily in January 2019. ?  ?3.  Labs from January February and March 2019 done at [facility] showed no M spike, normal kappa lambda ratio of 1.06.  He was last seen by them in April 2019. ?  ?Following that patient was admitted to [facility] for healthcare associated pneumonia and was recently discharged.  He wished to transfer his care to [place] at this time. ?  ?With regards to his myeloma patient is currently on 5 mg of Revlimid daily.  He is also on acyclovir 400 mg twice daily which he is to continue for  a minimum of 1 year post transplant.  Patient has chronic back pain as well as chemo-induced peripheral neuropathy for which he was seen by [name] in the past and is currently seeing [facility] pain clinic and is under pain contract with them ?  ? ?Interval history-continues to have problems with his peripheral neuropathy in his hands and feet and his present job is making it harder to work for the day.  He has baseline fatigue.  Otherwise tolerating Revlimid well without any significant side effects ? ?ECOG PS- 1 ?Pain scale- 3 ?Opioid associated constipation- no ? ?Review of systems- Review of Systems  ?Constitutional:  Positive for malaise/fatigue. Negative for chills, fever and weight loss.  ?HENT:  Negative for congestion, ear discharge and nosebleeds.   ?Eyes:  Negative for blurred vision.  ?Respiratory:  Negative for cough, hemoptysis, sputum production, shortness of breath and wheezing.   ?Cardiovascular:  Negative for chest pain, palpitations, orthopnea and claudication.  ?Gastrointestinal:  Negative for abdominal pain, blood in stool, constipation, diarrhea, heartburn, melena, nausea and vomiting.  ?Genitourinary:  Negative for dysuria, flank pain, frequency, hematuria and urgency.  ?Musculoskeletal:  Negative for back pain, joint pain and myalgias.  ?Skin:  Negative for rash.  ?Neurological:  Positive for sensory change (Peripheral neuropathy). Negative for dizziness, tingling, focal weakness, seizures, weakness and headaches.  ?Endo/Heme/Allergies:  Does not bruise/bleed easily.  ?Psychiatric/Behavioral:  Negative for depression and suicidal ideas. The patient does not have insomnia.    ? ? ?  No Known Allergies ? ? ?Past Medical History:  ?Diagnosis Date  ? CKD (chronic kidney disease) stage 3, GFR 30-59 ml/min (HCC)   ? Hypertension   ? Multiple myeloma ([place]) 08/11/2017  ? Neuropathy   ? Pneumonia   ? Severe sepsis ([place]) 08/11/2017  ? Shingles 08/03/2019  ? ? ? ?Past Surgical History:  ?Procedure  Laterality Date  ? ABDOMINAL SURGERY    ? COLONOSCOPY WITH PROPOFOL N/A 05/25/2021  ? Procedure: COLONOSCOPY WITH PROPOFOL;  Surgeon: Lin Landsman, MD;  Location: [facility];  Service: Gastroenterology;  Laterality: N/A;  ? JOINT REPLACEMENT    ? right knee  ? KNEE SURGERY Left   ? ? ?Social History  ? ?Socioeconomic History  ? Marital status: Legally Separated  ?  Spouse name: [name]  ? Number of children: 3  ? Years of education: Not on file  ? Highest education level: Not on file  ?Occupational History  ? Not on file  ?Tobacco Use  ? Smoking status: Former  ? Smokeless tobacco: Current  ?  Types: Snuff  ?Vaping Use  ? Vaping Use: Never used  ?Substance and Sexual Activity  ? Alcohol use: No  ? Drug use: No  ? Sexual activity: Yes  ?Other Topics Concern  ? Not on file  ?Social History Narrative  ? Live in private residence with spouse and mother in law  ? ?Social Determinants of Health  ? ?Financial Resource Strain: Not on file  ?Food Insecurity: Not on file  ?Transportation Needs: Not on file  ?Physical Activity: Not on file  ?Stress: Not on file  ?Social Connections: Not on file  ?Intimate Partner Violence: Not on file  ? ? ?Family History  ?Problem Relation Age of Onset  ? Cancer Mother 15  ?     Pancreatic  ? COPD Mother   ? Diabetes Mother   ? Hyperlipidemia Mother   ? Hypertension Mother   ? Cancer Maternal Uncle   ?     pancreatic  ? Cancer Maternal Grandmother 78  ?     colon  ? COPD Maternal Grandmother   ? Diabetes Maternal Grandmother   ? Hypertension Maternal Grandmother   ? ? ? ?Current Outpatient Medications:  ?  amLODipine (NORVASC) 10 MG tablet, Take 1 tablet (10 mg total) by mouth daily. For blood pressure., Disp: 90 tablet, Rfl: 0 ?  aspirin EC 81 MG tablet, Take 81 mg by mouth daily., Disp: , Rfl:  ?  baclofen (LIORESAL) 10 MG tablet, Take 1 tablet (10 mg total) by mouth 4 (four) times daily as needed for muscle spasms., Disp: 360 tablet, Rfl: 0 ?  lenalidomide (REVLIMID) 5 MG capsule,  Take 1 capsule (5 mg total) by mouth daily. [employer] Auth # [id number] Date Obtained: 08/26/2021, Disp: 28 capsule, Rfl: 0 ?  olmesartan (BENICAR) 20 MG tablet, Take 1 tablet (20 mg total) by mouth daily. For blood pressure., Disp: 90 tablet, Rfl: 3 ?  pregabalin (LYRICA) 150 MG capsule, Take 1 capsule (150 mg total) by mouth 3 (three) times daily as needed (for pain)., Disp: 270 capsule, Rfl: 0 ?  sertraline (ZOLOFT) 25 MG tablet, TAKE 1 TABLET BY MOUTH ONCE DAILY, Disp: 30 tablet, Rfl: 3 ?  SUMAtriptan (IMITREX) 50 MG tablet, Take 1 tablet by mouth at migraine onset. May repeat in 2 hours if headache persists or recurs., Disp: 10 tablet, Rfl: 0 ?  topiramate (TOPAMAX) 50 MG tablet, Take 1 tablet (50 mg total) by mouth at bedtime.  For headache prevention., Disp: 90 tablet, Rfl: 0 ?  oxyCODONE (OXY IR/ROXICODONE) 5 MG immediate release tablet, Take 1 tablet (5 mg total) by mouth every 8 (eight) hours as needed for severe pain. Must last 30 days. (Patient not taking: Reported on 09/04/2021), Disp: 90 tablet, Rfl: 0 ? ?Physical exam:  ?Vitals:  ? 09/04/21 0939  ?BP: (!) 161/97  ?Pulse: 67  ?Resp: 18  ?Temp: (!) 97.2 ?F (36.2 ?C)  ?SpO2: 100%  ?Weight: 150 lb 3.2 oz (68.1 kg)  ? ?Physical Exam ?Constitutional:   ?   General: He is not in acute distress. ?Cardiovascular:  ?   Rate and Rhythm: Normal rate and regular rhythm.  ?   Heart sounds: Normal heart sounds.  ?Pulmonary:  ?   Effort: Pulmonary effort is normal.  ?   Breath sounds: Normal breath sounds.  ?Skin: ?   General: Skin is warm and dry.  ?Neurological:  ?   Mental Status: He is alert and oriented to person, place, and time.  ?  ? ? ?  Latest Ref Rng & Units 09/04/2021  ?  9:23 AM  ?CMP  ?Glucose 70 - 99 mg/dL 126    ?BUN 6 - 20 mg/dL 18    ?Creatinine 0.61 - 1.24 mg/dL 1.51    ?Sodium 135 - 145 mmol/L 138    ?Potassium 3.5 - 5.1 mmol/L 3.7    ?Chloride 98 - 111 mmol/L 104    ?CO2 22 - 32 mmol/L 25    ?Calcium 8.9 - 10.3 mg/dL 9.1    ?Total Protein 6.5 - 8.1 g/dL  7.1    ?Total Bilirubin 0.3 - 1.2 mg/dL 0.8    ?Alkaline Phos 38 - 126 U/L 110    ?AST 15 - 41 U/L 27    ?ALT 0 - 44 U/L 24    ? ? ?  Latest Ref Rng & Units 09/04/2021  ?  9:23 AM  ?CBC  ?WBC 4.0 - 10.5 K/uL

## 2021-09-04 NOTE — Progress Notes (Signed)
?  Pt states two weeks ago he fell at work due to legs giving out; has to walk slow now; if he tries to walk fast he just falls over. If standing up too quick he tends to fall backwards. Pt states pain management does not like for pt to use CBD products and will not renew his pain meds and t is in lots of pain.  ?

## 2021-09-05 LAB — KAPPA/LAMBDA LIGHT CHAINS
Kappa free light chain: 39.5 mg/L — ABNORMAL HIGH (ref 3.3–19.4)
Kappa, lambda light chain ratio: 3.09 — ABNORMAL HIGH (ref 0.26–1.65)
Lambda free light chains: 12.8 mg/L (ref 5.7–26.3)

## 2021-09-07 ENCOUNTER — Other Ambulatory Visit: Payer: Self-pay

## 2021-09-07 ENCOUNTER — Other Ambulatory Visit: Payer: Self-pay | Admitting: *Deleted

## 2021-09-07 ENCOUNTER — Telehealth: Payer: Self-pay | Admitting: *Deleted

## 2021-09-07 DIAGNOSIS — R768 Other specified abnormal immunological findings in serum: Secondary | ICD-10-CM

## 2021-09-07 DIAGNOSIS — C9001 Multiple myeloma in remission: Secondary | ICD-10-CM

## 2021-09-07 LAB — PROTEIN ELECTROPHORESIS, SERUM
A/G Ratio: 1.7 (ref 0.7–1.7)
Albumin ELP: 4.1 g/dL (ref 2.9–4.4)
Alpha-1-Globulin: 0.1 g/dL (ref 0.0–0.4)
Alpha-2-Globulin: 0.6 g/dL (ref 0.4–1.0)
Beta Globulin: 0.9 g/dL (ref 0.7–1.3)
Gamma Globulin: 0.8 g/dL (ref 0.4–1.8)
Globulin, Total: 2.4 g/dL (ref 2.2–3.9)
Total Protein ELP: 6.5 g/dL (ref 6.0–8.5)

## 2021-09-07 NOTE — Telephone Encounter (Signed)
Called patient today to talk to him about his lab results the kappa lambda light chains have come up from 19.4-39.5.  Dr. Janese Banks would like him to have a PET scan and then determine what we need to do after that based on what we see on the scan.  Patient says that he does not want to go back to [facility] and he does not want anything that is going to mess with his immunity.  I told him that we should get the scan get what the options are and then make a decision with that patient is agreeable

## 2021-09-07 NOTE — Progress Notes (Signed)
Pt is aware and understands PET scan details.

## 2021-09-07 NOTE — Progress Notes (Signed)
It is ordered and scheduled and pt knows about the date and time

## 2021-09-10 ENCOUNTER — Encounter: Payer: Self-pay | Admitting: Oncology

## 2021-09-19 ENCOUNTER — Encounter
Admission: RE | Admit: 2021-09-19 | Discharge: 2021-09-19 | Disposition: A | Discharge status: Home / Self Care | Payer: BC Managed Care – PPO | Source: Ambulatory Visit | Attending: Oncology | Admitting: Oncology

## 2021-09-19 ENCOUNTER — Other Ambulatory Visit: Payer: Self-pay

## 2021-09-19 DIAGNOSIS — R768 Other specified abnormal immunological findings in serum: Secondary | ICD-10-CM | POA: Diagnosis not present

## 2021-09-19 DIAGNOSIS — C9000 Multiple myeloma not having achieved remission: Secondary | ICD-10-CM | POA: Diagnosis not present

## 2021-09-19 DIAGNOSIS — C9001 Multiple myeloma in remission: Secondary | ICD-10-CM | POA: Insufficient documentation

## 2021-09-19 MED ORDER — FLUDEOXYGLUCOSE F - 18 (FDG) INJECTION
7.8000 | Freq: Once | INTRAVENOUS | Status: AC | PRN
Start: 2021-09-19 — End: 2021-09-19
  Administered 2021-09-19: 8.35 via INTRAVENOUS

## 2021-09-20 LAB — GLUCOSE, CAPILLARY: Glucose-Capillary: 92 mg/dL (ref 70–99)

## 2021-09-21 ENCOUNTER — Inpatient Hospital Stay: Payer: BC Managed Care – PPO | Attending: Oncology | Admitting: Oncology

## 2021-09-21 ENCOUNTER — Encounter: Payer: Self-pay | Admitting: Oncology

## 2021-09-21 VITALS — BP 154/98 | HR 97 | Temp 97.6°F | Resp 18 | Wt 147.0 lb

## 2021-09-21 DIAGNOSIS — Z7961 Long term (current) use of immunomodulator: Secondary | ICD-10-CM | POA: Insufficient documentation

## 2021-09-21 DIAGNOSIS — Z9484 Stem cells transplant status: Secondary | ICD-10-CM | POA: Insufficient documentation

## 2021-09-21 DIAGNOSIS — C9002 Multiple myeloma in relapse: Secondary | ICD-10-CM | POA: Diagnosis not present

## 2021-09-21 DIAGNOSIS — C9001 Multiple myeloma in remission: Secondary | ICD-10-CM

## 2021-09-21 DIAGNOSIS — Z7982 Long term (current) use of aspirin: Secondary | ICD-10-CM | POA: Insufficient documentation

## 2021-09-21 DIAGNOSIS — Z79899 Other long term (current) drug therapy: Secondary | ICD-10-CM | POA: Diagnosis not present

## 2021-09-21 NOTE — Progress Notes (Signed)
Pt states he is constantly tired and no able to sleep; still hurting in the same area (lower back and legs) not taking oxycodone due to pain management.

## 2021-09-23 DIAGNOSIS — Z7961 Long term (current) use of immunomodulator: Secondary | ICD-10-CM | POA: Diagnosis not present

## 2021-09-23 DIAGNOSIS — Z9484 Stem cells transplant status: Secondary | ICD-10-CM | POA: Diagnosis not present

## 2021-09-23 DIAGNOSIS — Z7982 Long term (current) use of aspirin: Secondary | ICD-10-CM | POA: Diagnosis not present

## 2021-09-23 DIAGNOSIS — Z79899 Other long term (current) drug therapy: Secondary | ICD-10-CM | POA: Diagnosis not present

## 2021-09-23 DIAGNOSIS — C9002 Multiple myeloma in relapse: Secondary | ICD-10-CM | POA: Diagnosis not present

## 2021-09-24 ENCOUNTER — Telehealth: Payer: Self-pay | Admitting: Pharmacist

## 2021-09-24 ENCOUNTER — Other Ambulatory Visit: Payer: Self-pay

## 2021-09-24 DIAGNOSIS — C9000 Multiple myeloma not having achieved remission: Secondary | ICD-10-CM

## 2021-09-24 DIAGNOSIS — C9001 Multiple myeloma in remission: Secondary | ICD-10-CM

## 2021-09-24 MED ORDER — LENALIDOMIDE 15 MG PO CAPS: 15.0000 mg | ORAL_CAPSULE | Freq: Every day | ORAL | 0 refills | Status: DC

## 2021-09-24 MED ORDER — DEXAMETHASONE 2 MG PO TABS
10.0000 mg | ORAL_TABLET | ORAL | 2 refills | Status: DC
  Filled 2021-09-24: qty 20, 28d supply, fill #0

## 2021-09-24 NOTE — Telephone Encounter (Signed)
Oral Oncology Pharmacist Encounter  Due to recent signs of progressive disease, patient will be switched from maintenance Revlimid (lenalidomide) to active treatment dosing. He will also be restarted on dexamethasone.   Prescription has been e-scribed to the [facility] for benefits analysis and approval.  Oral Oncology Clinic will continue to follow for start date.   Darl Pikes, PharmD, BCPS, BCOP, CPP Hematology/Oncology Clinical Pharmacist Practitioner [place]/DB/AP Oral [place] [phone]  09/24/2021 8:12 PM

## 2021-09-25 ENCOUNTER — Other Ambulatory Visit: Payer: Self-pay

## 2021-09-25 ENCOUNTER — Other Ambulatory Visit: Payer: Self-pay | Admitting: Pharmacist

## 2021-09-25 ENCOUNTER — Telehealth: Payer: Self-pay

## 2021-09-25 DIAGNOSIS — C9001 Multiple myeloma in remission: Secondary | ICD-10-CM

## 2021-09-25 DIAGNOSIS — C9000 Multiple myeloma not having achieved remission: Secondary | ICD-10-CM

## 2021-09-25 LAB — UPEP/TP, 24-HR URINE
Albumin, U: 100 %
Alpha 1, Urine: 0 %
Alpha 2, Urine: 0 %
Beta, Urine: 0 %
Gamma Globulin, Urine: 0 %
Total Protein, Urine-Ur/day: 50 mg/24 hr (ref 30–150)
Total Protein, Urine: 4.5 mg/dL
Total Volume: 1100

## 2021-09-25 MED ORDER — LENALIDOMIDE 15 MG PO CAPS: 15.0000 mg | ORAL_CAPSULE | Freq: Every day | ORAL | 0 refills | Status: DC

## 2021-09-25 NOTE — Telephone Encounter (Signed)
Referral has been sent to Dr. Mertha Finders office at [facility] to re-refer pt. Receieved a fax confirmation for [phone].

## 2021-09-25 NOTE — Telephone Encounter (Signed)
Oral Chemotherapy Pharmacist Encounter   Patient informed that he new '15mg'$  Revlimid prescription was sent to [facility] and his dexamethasone prescription was sent to [facility].  I asked him to call when his Revlimid is set-up for delivery. He knows to get started on both medications, once his Revlimid is delivered.  I will make the patient a medication calendar once he calls back with his delivery date.    Darl Pikes, PharmD, BCPS, BCOP, CPP Hematology/Oncology Clinical Pharmacist [place]/DB/AP Oral [place] [facility] [phone]  09/25/2021 9:34 AM

## 2021-09-26 ENCOUNTER — Other Ambulatory Visit: Payer: Self-pay

## 2021-09-28 ENCOUNTER — Other Ambulatory Visit: Payer: Self-pay

## 2021-10-03 ENCOUNTER — Other Ambulatory Visit: Payer: Self-pay | Admitting: Primary Care

## 2021-10-03 ENCOUNTER — Other Ambulatory Visit: Payer: Self-pay

## 2021-10-03 DIAGNOSIS — M792 Neuralgia and neuritis, unspecified: Secondary | ICD-10-CM

## 2021-10-03 DIAGNOSIS — G43709 Chronic migraine without aura, not intractable, without status migrainosus: Secondary | ICD-10-CM

## 2021-10-03 DIAGNOSIS — G620 Drug-induced polyneuropathy: Secondary | ICD-10-CM

## 2021-10-03 DIAGNOSIS — R519 Headache, unspecified: Secondary | ICD-10-CM

## 2021-10-03 MED ORDER — TOPIRAMATE 50 MG PO TABS
50.0000 mg | ORAL_TABLET | Freq: Every day | ORAL | 0 refills | Status: DC
  Filled 2021-10-03 (×2): qty 90, 90d supply, fill #0

## 2021-10-03 MED ORDER — PREGABALIN 150 MG PO CAPS
150.0000 mg | ORAL_CAPSULE | Freq: Three times a day (TID) | ORAL | 0 refills | Status: DC | PRN
  Filled 2021-10-03 – 2021-10-25 (×2): qty 270, 90d supply, fill #0

## 2021-10-04 ENCOUNTER — Other Ambulatory Visit: Payer: Self-pay

## 2021-10-07 ENCOUNTER — Encounter: Payer: Self-pay | Admitting: Oncology

## 2021-10-07 NOTE — Progress Notes (Signed)
Hematology/Oncology Consult note [facility]  [phone] [phone]  Patient Care Team: James Koch, NP as PCP - General (Internal Medicine) James Redhead, MD as PCP - Hematology/Oncology James Guadeloupe, MD as Consulting Physician (Hematology and Oncology)   Name of the patient: James House  [id number]  1971-01-07   Date of visit: 10/07/21  Diagnosis-  light chain kappa multiple myeloma s/p ASCT concerning for relapse  Chief complaint/ Reason for visit-discuss further management of multiple myeloma  Heme/Onc history: Patient is a 51-b year-old male with a history of kappa light chain myeloma that was treated by Dr. Naomie House.  History is as follows:   1.  Patient presented with renal insufficiency with a creatinine of 2.4 in April 2018.  Kidney biopsy showed myeloma cast nephropathy.  Kappa light chain was 3004 and lambda 0.22 with a kappa lambda ratio of 13,000 655.  Skeletal survey showed multiple calvarial and marrow lesions.  Bone marrow biopsy in May 2018 showed 43% plasma cells.  He received CyBorD for cycle 1 in May 2018 and was subsequently switched to RVD.  He received 4 cycles followed by a repeat bone marrow biopsy in August 2018 which showed no increase in plasma cells.   2.  He underwent autologous stem cell transplantation on 01/30/2017.  He was started on Revlimid maintenance 10 mg daily in January 2019.   3.  Labs from January February and March 2019 done at [facility] showed no M spike, normal kappa lambda ratio of 1.06.  He was last seen by them in April 2019.   Following that patient was admitted to [facility] for healthcare associated pneumonia and was recently discharged.  He wished to transfer his care to [place] at this time.   With regards to his myeloma patient is currently on 5 mg of Revlimid daily.  He is also on acyclovir 400 mg twice daily which he is to continue for a minimum of 1 year post transplant.  Patient has  chronic back pain as well as chemo-induced peripheral neuropathy for which he was seen by [name] in the past and is currently seeing [name] and is under pain contract with them  Interval history-reports more fatigue as compared to his baseline.  He also has baseline neuropathy which makes it difficult for him to work.  ECOG PS- 1 Pain scale- 4   Review of systems- Review of Systems  Constitutional:  Positive for malaise/fatigue. Negative for chills, fever and weight loss.  HENT:  Negative for congestion, ear discharge and nosebleeds.   Eyes:  Negative for blurred vision.  Respiratory:  Negative for cough, hemoptysis, sputum production, shortness of breath and wheezing.   Cardiovascular:  Negative for chest pain, palpitations, orthopnea and claudication.  Gastrointestinal:  Negative for abdominal pain, blood in stool, constipation, diarrhea, heartburn, melena, nausea and vomiting.  Genitourinary:  Negative for dysuria, flank pain, frequency, hematuria and urgency.  Musculoskeletal:  Negative for back pain, joint pain and myalgias.  Skin:  Negative for rash.  Neurological:  Positive for sensory change (Peripheral neuropathy). Negative for dizziness, tingling, focal weakness, seizures, weakness and headaches.  Endo/Heme/Allergies:  Does not bruise/bleed easily.  Psychiatric/Behavioral:  Negative for depression and suicidal ideas. The patient does not have insomnia.       No Known Allergies   Past Medical History:  Diagnosis Date   CKD (chronic kidney disease) stage 3, GFR 30-59 ml/min (HCC)    Hypertension  Multiple myeloma ([place]) 08/11/2017   Neuropathy    Pneumonia    Severe sepsis ([place]) 08/11/2017   Shingles 08/03/2019     Past Surgical History:  Procedure Laterality Date   ABDOMINAL SURGERY     COLONOSCOPY WITH PROPOFOL N/A 05/25/2021   Procedure: COLONOSCOPY WITH PROPOFOL;  Surgeon: Lin Landsman, MD;  Location: [facility] ENDOSCOPY;  Service:  Gastroenterology;  Laterality: N/A;   JOINT REPLACEMENT     right knee   KNEE SURGERY Left     Social History   Socioeconomic History   Marital status: Legally Separated    Spouse name: James House   Number of children: 3   Years of education: Not on file   Highest education level: Not on file  Occupational History   Not on file  Tobacco Use   Smoking status: Former   Smokeless tobacco: Current    Types: Snuff  Vaping Use   Vaping Use: Never used  Substance and Sexual Activity   Alcohol use: No   Drug use: No   Sexual activity: Yes  Other Topics Concern   Not on file  Social History Narrative   Live in private residence with spouse and mother in law   Social Determinants of Health   Financial Resource Strain: [place]  (08/12/2017)   Overall Financial Resource Strain (CARDIA)    Difficulty of Paying Living Expenses: Not hard at all  Food Insecurity: No Food Insecurity (08/12/2017)   Hunger Vital Sign    Worried About Running Out of Food in the Last Year: Never true    [place] in the Last Year: Never true  Transportation Needs: No Transportation Needs (08/12/2017)   PRAPARE - Hydrologist (Medical): No    Lack of Transportation (Non-Medical): No  Physical Activity: Sufficiently Active (08/12/2017)   Exercise Vital Sign    Days of Exercise per Week: 7 days    Minutes of Exercise per Session: 30 min  Stress: No Stress Concern Present (08/12/2017)   [place]    Feeling of Stress : Only a little  Social Connections: Somewhat Isolated (08/12/2017)   Social Connection and Isolation Panel [NHANES]    Frequency of Communication with Friends and Family: Once a week    Frequency of Social Gatherings with Friends and Family: Once a week    Attends Religious Services: More than 4 times per year    Active Member of [employer] or Organizations: No    Attends Archivist Meetings:  Never    Marital Status: Married  Human resources officer Violence: Not At Risk (08/12/2017)   Humiliation, Afraid, Rape, and Kick questionnaire    Fear of Current or Ex-Partner: No    Emotionally Abused: No    Physically Abused: No    Sexually Abused: No    Family History  Problem Relation Age of Onset   Cancer Mother 48       Pancreatic   COPD Mother    Diabetes Mother    Hyperlipidemia Mother    Hypertension Mother    Cancer Maternal Uncle        pancreatic   Cancer Maternal Grandmother 38       colon   COPD Maternal Grandmother    Diabetes Maternal Grandmother    Hypertension Maternal Grandmother      Current Outpatient Medications:    amLODipine (NORVASC) 10 MG tablet, Take 1 tablet (10 mg total) by  mouth daily. For blood pressure., Disp: 90 tablet, Rfl: 0   aspirin EC 81 MG tablet, Take 81 mg by mouth daily., Disp: , Rfl:    baclofen (LIORESAL) 10 MG tablet, Take 1 tablet (10 mg total) by mouth 4 (four) times daily as needed for muscle spasms., Disp: 360 tablet, Rfl: 0   olmesartan (BENICAR) 20 MG tablet, Take 1 tablet (20 mg total) by mouth daily. For blood pressure., Disp: 90 tablet, Rfl: 3   sertraline (ZOLOFT) 25 MG tablet, TAKE 1 TABLET BY MOUTH ONCE DAILY, Disp: 30 tablet, Rfl: 3   SUMAtriptan (IMITREX) 50 MG tablet, Take 1 tablet by mouth at migraine onset. May repeat in 2 hours if headache persists or recurs., Disp: 10 tablet, Rfl: 0   dexamethasone (DECADRON) 2 MG tablet, Take 5 tablets (10 mg total) by mouth once a week. Take with food., Disp: 20 tablet, Rfl: 2   lenalidomide (REVLIMID) 15 MG capsule, Take 1 capsule (15 mg total) by mouth daily. Take for 21 days, then hold for 7 days. Repeat every 28 days., Disp: 21 capsule, Rfl: 0   oxyCODONE (OXY IR/ROXICODONE) 5 MG immediate release tablet, Take 1 tablet (5 mg total) by mouth every 8 (eight) hours as needed for severe pain. Must last 30 days. (Patient not taking: Reported on 09/04/2021), Disp: 90 tablet, Rfl: 0    pregabalin (LYRICA) 150 MG capsule, Take 1 capsule (150 mg total) by mouth 3 (three) times daily as needed (for pain)., Disp: 270 capsule, Rfl: 0   topiramate (TOPAMAX) 50 MG tablet, Take 1 tablet (50 mg total) by mouth at bedtime. For headache prevention., Disp: 90 tablet, Rfl: 0  Physical exam:  Vitals:   09/21/21 1304  BP: (!) 154/98  Pulse: 97  Resp: 18  Temp: 97.6 F (36.4 C)  SpO2: 100%  Weight: 147 lb (66.7 kg)   Physical Exam Constitutional:      General: He is not in acute distress. Cardiovascular:     Rate and Rhythm: Normal rate and regular rhythm.     Heart sounds: Normal heart sounds.  Pulmonary:     Effort: Pulmonary effort is normal.     Breath sounds: Normal breath sounds.  Abdominal:     General: Bowel sounds are normal.     Palpations: Abdomen is soft.  Skin:    General: Skin is warm and dry.  Neurological:     Mental Status: He is alert and oriented to person, place, and time.         Latest Ref Rng & Units 09/04/2021    9:23 AM  CMP  Glucose 70 - 99 mg/dL 126   BUN 6 - 20 mg/dL 18   Creatinine 0.61 - 1.24 mg/dL 1.51   Sodium 135 - 145 mmol/L 138   Potassium 3.5 - 5.1 mmol/L 3.7   Chloride 98 - 111 mmol/L 104   CO2 22 - 32 mmol/L 25   Calcium 8.9 - 10.3 mg/dL 9.1   Total Protein 6.5 - 8.1 g/dL 7.1   Total Bilirubin 0.3 - 1.2 mg/dL 0.8   Alkaline Phos 38 - 126 U/L 110   AST 15 - 41 U/L 27   ALT 0 - 44 U/L 24       Latest Ref Rng & Units 09/04/2021    9:23 AM  CBC  WBC 4.0 - 10.5 K/uL 4.1   Hemoglobin 13.0 - 17.0 g/dL 16.1   Hematocrit 39.0 - 52.0 % 46.6   Platelets 150 -  400 K/uL 185     No images are attached to the encounter.  NM PET Image Restage (PS) Whole Body  Result Date: 09/20/2021 CLINICAL DATA:  Subsequent treatment strategy for multiple myeloma. EXAM: NUCLEAR MEDICINE PET WHOLE BODY TECHNIQUE: 8.35 mCi F-18 FDG was injected intravenously. Full-ring PET imaging was performed from the head to foot after the radiotracer. CT data  was obtained and used for attenuation correction and anatomic localization. Fasting blood glucose: 92 mg/dl COMPARISON:  Multiple priors including MRI thoracic and lumbar spine Sep 11, 2018, CT December 22, 2017 FINDINGS: Mediastinal blood pool activity: SUV max 1.8 HEAD/NECK: No hypermetabolic activity in the scalp. No hypermetabolic cervical lymph nodes. Symmetric hypermetabolic hyperplasia of the tonsils with a max SUV of 5.8, commonly reactive. Incidental CT findings: Streak artifact from dental hardware CHEST: No hypermetabolic thoracic lymph nodes or pulmonary nodules/masses. Incidental CT findings: No suspicious pulmonary nodules or masses. Aortic atherosclerosis. ABDOMEN/PELVIS: No abnormal hypermetabolic activity within the liver, pancreas, adrenal glands, or spleen. No hypermetabolic lymph nodes in the abdomen or pelvis. Incidental CT findings: none SKELETON: No focal hypermetabolic activity to suggest skeletal metastasis. Incidental CT findings: Multilevel degenerative changes spine. EXTREMITIES: No suspicious abnormal hypermetabolic activity in the lower extremities. Hypermetabolic activity about a left lateral partial arthroplasty without focal underlying lesion identified, consistent with a reactive/inflammatory process. Incidental CT findings: none IMPRESSION: 1. No abnormal foci of increased uptake within the skeleton to suggest metabolically active multiple myeloma. 2. No suspicious bone lesions identified on the CT portion of the exam. 3. No tracer avid lymph node or mass identified to suggest soft tissue plasmacytoma Electronically Signed   By: Dahlia Bailiff M.D.   On: 09/20/2021 13:25     Assessment and plan- Patient is a 51 y.o. male with history of kappa light chain multiple myeloma s/p CyBorD chemotherapy followed by autologous stem cell transplantation currently on Revlimid maintenance here for routine follow-up  Myeloma panel does not show any evidence of M protein however serum free  light chains shows a consistent increase in his kappa light chain from 15 a year ago to 18 in April 2022.  It was 19.4 in October 2022 and presently 39.5 in May 2023.  Ratio is increased from 1.4-3.09 over the last 1-1/2-year indicating that his myeloma is likely coming back slowly.  He did get a PET CT scan which does not show any evidence of active myeloma.  I have asked him to increase his Revlimid dose from 5 mg daily to 15 mg daily and we will continue to observe his serum free light chains on a monthly basis.  I will also refer him to [facility] for a second opinion for his myeloma where he has received autologous stem cell transplantation in the past.  I will also be repeating his 24-hour urine protein electrophoresis at this time.  I will see him back in 4 weeks with CBC with differential CMP myeloma panel and serum free light chains   Visit Diagnosis 1. Multiple myeloma in relapse [facility])      Dr. Randa Evens, MD, MPH [facility] [phone] 10/07/2021 7:14 PM

## 2021-10-08 ENCOUNTER — Other Ambulatory Visit: Payer: Self-pay | Admitting: *Deleted

## 2021-10-08 DIAGNOSIS — C9000 Multiple myeloma not having achieved remission: Secondary | ICD-10-CM

## 2021-10-08 NOTE — Telephone Encounter (Signed)
This was done already.

## 2021-10-17 ENCOUNTER — Other Ambulatory Visit: Payer: Self-pay | Admitting: *Deleted

## 2021-10-17 DIAGNOSIS — C9000 Multiple myeloma not having achieved remission: Secondary | ICD-10-CM

## 2021-10-18 ENCOUNTER — Encounter: Payer: Self-pay | Admitting: Oncology

## 2021-10-19 ENCOUNTER — Other Ambulatory Visit: Payer: Self-pay

## 2021-10-19 MED ORDER — OLANZAPINE 10 MG PO TABS
10.0000 mg | ORAL_TABLET | Freq: Every day | ORAL | 1 refills | Status: DC
  Filled 2021-10-19: qty 30, 30d supply, fill #0

## 2021-10-19 NOTE — Telephone Encounter (Signed)
Olanzapine 10 mg QHS

## 2021-10-22 ENCOUNTER — Inpatient Hospital Stay: Payer: BC Managed Care – PPO | Attending: Oncology | Admitting: Oncology

## 2021-10-22 ENCOUNTER — Encounter: Payer: Self-pay | Admitting: Oncology

## 2021-10-22 ENCOUNTER — Inpatient Hospital Stay: Payer: BC Managed Care – PPO

## 2021-10-22 ENCOUNTER — Other Ambulatory Visit: Payer: Self-pay

## 2021-10-22 VITALS — BP 134/79 | HR 72 | Temp 98.2°F | Resp 16 | Wt 147.0 lb

## 2021-10-22 DIAGNOSIS — R110 Nausea: Secondary | ICD-10-CM | POA: Insufficient documentation

## 2021-10-22 DIAGNOSIS — Z9484 Stem cells transplant status: Secondary | ICD-10-CM | POA: Insufficient documentation

## 2021-10-22 DIAGNOSIS — C9002 Multiple myeloma in relapse: Secondary | ICD-10-CM

## 2021-10-22 DIAGNOSIS — G620 Drug-induced polyneuropathy: Secondary | ICD-10-CM | POA: Insufficient documentation

## 2021-10-22 DIAGNOSIS — T451X5A Adverse effect of antineoplastic and immunosuppressive drugs, initial encounter: Secondary | ICD-10-CM | POA: Insufficient documentation

## 2021-10-22 DIAGNOSIS — Z87891 Personal history of nicotine dependence: Secondary | ICD-10-CM | POA: Diagnosis not present

## 2021-10-22 DIAGNOSIS — Z79899 Other long term (current) drug therapy: Secondary | ICD-10-CM | POA: Diagnosis not present

## 2021-10-22 DIAGNOSIS — Z800 Family history of malignant neoplasm of digestive organs: Secondary | ICD-10-CM | POA: Insufficient documentation

## 2021-10-22 LAB — COMPREHENSIVE METABOLIC PANEL
ALT: 17 U/L (ref 0–44)
AST: 19 U/L (ref 15–41)
Albumin: 3.7 g/dL (ref 3.5–5.0)
Alkaline Phosphatase: 100 U/L (ref 38–126)
Anion gap: 8 (ref 5–15)
BUN: 15 mg/dL (ref 6–20)
CO2: 23 mmol/L (ref 22–32)
Calcium: 8.7 mg/dL — ABNORMAL LOW (ref 8.9–10.3)
Chloride: 109 mmol/L (ref 98–111)
Creatinine, Ser: 1.38 mg/dL — ABNORMAL HIGH (ref 0.61–1.24)
GFR, Estimated: >60 mL/min (ref >60)
Glucose, Bld: 138 mg/dL — ABNORMAL HIGH (ref 70–99)
Potassium: 3.3 mmol/L — ABNORMAL LOW (ref 3.5–5.1)
Sodium: 140 mmol/L (ref 135–145)
Total Bilirubin: 0.9 mg/dL (ref 0.3–1.2)
Total Protein: 6.4 g/dL — ABNORMAL LOW (ref 6.5–8.1)

## 2021-10-22 LAB — CBC WITH DIFFERENTIAL/PLATELET
Abs Immature Granulocytes: 0.01 10*3/uL (ref 0.00–0.07)
Basophils Absolute: 0.1 10*3/uL (ref 0.0–0.1)
Basophils Relative: 2 %
Eosinophils Absolute: 0.2 10*3/uL (ref 0.0–0.5)
Eosinophils Relative: 5 %
HCT: 44.8 % (ref 39.0–52.0)
Hemoglobin: 15.5 g/dL (ref 13.0–17.0)
Immature Granulocytes: 0 %
Lymphocytes Relative: 28 %
Lymphs Abs: 1 10*3/uL (ref 0.7–4.0)
MCH: 32.9 pg (ref 26.0–34.0)
MCHC: 34.6 g/dL (ref 30.0–36.0)
MCV: 95.1 fL (ref 80.0–100.0)
Monocytes Absolute: 0.3 10*3/uL (ref 0.1–1.0)
Monocytes Relative: 9 %
Neutro Abs: 2.1 10*3/uL (ref 1.7–7.7)
Neutrophils Relative %: 56 %
Platelets: 155 10*3/uL (ref 150–400)
RBC: 4.71 MIL/uL (ref 4.22–5.81)
RDW: 13.8 % (ref 11.5–15.5)
WBC: 3.7 10*3/uL — ABNORMAL LOW (ref 4.0–10.5)
nRBC: 0 % (ref 0.0–0.2)

## 2021-10-22 MED ORDER — POTASSIUM CHLORIDE CRYS ER 20 MEQ PO TBCR
20.0000 meq | EXTENDED_RELEASE_TABLET | Freq: Every day | ORAL | 0 refills | Status: DC
  Filled 2021-10-22: qty 14, 14d supply, fill #0

## 2021-10-22 MED ORDER — PROCHLORPERAZINE MALEATE 10 MG PO TABS
10.0000 mg | ORAL_TABLET | Freq: Four times a day (QID) | ORAL | 0 refills | Status: DC | PRN
  Filled 2021-10-22: qty 60, 15d supply, fill #0

## 2021-10-22 NOTE — Progress Notes (Signed)
Hematology/Oncology Consult note [facility]  [phone] [phone]  Patient Care Team: Pleas Koch, NP as PCP - General (Internal Medicine) Vella Redhead, MD as PCP - Hematology/Oncology Sindy Guadeloupe, MD as Consulting Physician (Hematology and Oncology)   Name of the patient: James House  [id number]  November 30, 1970   Date of visit: 10/22/21  Diagnosis-  light chain kappa multiple myeloma s/p ASCT now with relapse  Chief complaint/ Reason for visit-routine follow-up of multiple myeloma on Revlimid  Heme/Onc history:  Patient is a 51-year-old male with a history of kappa light chain myeloma that was treated by [name].  History is as follows:   1.  Patient presented with renal insufficiency with a creatinine of 2.4 in April 2018.  Kidney biopsy showed myeloma cast nephropathy.  Kappa light chain was 3004 and lambda 0.22 with a kappa lambda ratio of 13,000 655.  Skeletal survey showed multiple calvarial and marrow lesions.  Bone marrow biopsy in May 2018 showed 43% plasma cells.  He received CyBorD for cycle 1 in May 2018 and was subsequently switched to RVD.  He received 4 cycles followed by a repeat bone marrow biopsy in August 2018 which showed no increase in plasma cells.   2.  He underwent autologous stem cell transplantation on 01/30/2017.  He was started on Revlimid maintenance 10 mg daily in January 2019.   3.  Labs from January February and March 2019 done at [facility] showed no M spike, normal kappa lambda ratio of 1.06.  He was last seen by them in April 2019.   4. Following that patient was admitted to [facility] for healthcare associated pneumonia and was recently discharged.  He wished to transfer his care to [place] at this time.  He was on maintenance Revlimid 5 mg which was then reduced to 2.5 mg.  Follows up with pain clinic for chemo-induced peripheral neuropathy with Dr. Dossie Arbour.  5.  Noted to have gradually increasing free  kappa light chainFrom 15 in 20 21-39.5 with a ratio of 3.09.  Presently on Revlimid 15 mg 3 weeks on and 1 week off  Interval history-notes that since he started taking Revlimid his nausea is worse.  Also reports new area of numbness over his right palm underneath the thumb.  Reports ongoing fatigue.  Also has chronic peripheral neuropathy.  ECOG PS- 1 Pain scale- 4 Opioid associated constipation- no  Review of systems- Review of Systems  Constitutional:  Negative for chills, fever, malaise/fatigue and weight loss.  HENT:  Negative for congestion, ear discharge and nosebleeds.   Eyes:  Negative for blurred vision.  Respiratory:  Negative for cough, hemoptysis, sputum production, shortness of breath and wheezing.   Cardiovascular:  Negative for chest pain, palpitations, orthopnea and claudication.  Gastrointestinal:  Negative for abdominal pain, blood in stool, constipation, diarrhea, heartburn, melena, nausea and vomiting.  Genitourinary:  Negative for dysuria, flank pain, frequency, hematuria and urgency.  Musculoskeletal:  Negative for back pain, joint pain and myalgias.  Skin:  Negative for rash.  Neurological:  Positive for sensory change (Peripheral neuropathy). Negative for dizziness, tingling, focal weakness, seizures, weakness and headaches.  Endo/Heme/Allergies:  Does not bruise/bleed easily.  Psychiatric/Behavioral:  Negative for depression and suicidal ideas. The patient does not have insomnia.       No Known Allergies   Past Medical History:  Diagnosis Date   CKD (chronic kidney disease) stage 3, GFR 30-59 ml/min (HCC)    Hypertension  Multiple myeloma ([place]) 08/11/2017   Neuropathy    Pneumonia    Severe sepsis ([place]) 08/11/2017   Shingles 08/03/2019     Past Surgical History:  Procedure Laterality Date   ABDOMINAL SURGERY     COLONOSCOPY WITH PROPOFOL N/A 05/25/2021   Procedure: COLONOSCOPY WITH PROPOFOL;  Surgeon: Lin Landsman, MD;  Location: [facility]  ENDOSCOPY;  Service: Gastroenterology;  Laterality: N/A;   JOINT REPLACEMENT     right knee   KNEE SURGERY Left     Social History   Socioeconomic History   Marital status: Legally Separated    Spouse name: [name]   Number of children: 3   Years of education: Not on file   Highest education level: Not on file  Occupational History   Not on file  Tobacco Use   Smoking status: Former   Smokeless tobacco: Current    Types: Snuff  Vaping Use   Vaping Use: Never used  Substance and Sexual Activity   Alcohol use: No   Drug use: No   Sexual activity: Yes  Other Topics Concern   Not on file  Social History Narrative   Live in private residence with spouse and mother in law   Social Determinants of Health   Financial Resource Strain: [place]  (08/12/2017)   Overall Financial Resource Strain (CARDIA)    Difficulty of Paying Living Expenses: Not hard at all  Food Insecurity: No Food Insecurity (08/12/2017)   Hunger Vital Sign    Worried About Running Out of Food in the Last Year: Never true    [place] in the Last Year: Never true  Transportation Needs: No Transportation Needs (08/12/2017)   PRAPARE - Hydrologist (Medical): No    Lack of Transportation (Non-Medical): No  Physical Activity: Sufficiently Active (08/12/2017)   Exercise Vital Sign    Days of Exercise per Week: 7 days    Minutes of Exercise per Session: 30 min  Stress: No Stress Concern Present (08/12/2017)   [place]    Feeling of Stress : Only a little  Social Connections: Somewhat Isolated (08/12/2017)   Social Connection and Isolation Panel [NHANES]    Frequency of Communication with Friends and Family: Once a week    Frequency of Social Gatherings with Friends and Family: Once a week    Attends Religious Services: More than 4 times per year    Active Member of [employer] or Organizations: No    Attends Theatre manager Meetings: Never    Marital Status: Married  Human resources officer Violence: Not At Risk (08/12/2017)   Humiliation, Afraid, Rape, and Kick questionnaire    Fear of Current or Ex-Partner: No    Emotionally Abused: No    Physically Abused: No    Sexually Abused: No    Family History  Problem Relation Age of Onset   Cancer Mother 16       Pancreatic   COPD Mother    Diabetes Mother    Hyperlipidemia Mother    Hypertension Mother    Cancer Maternal Uncle        pancreatic   Cancer Maternal Grandmother 57       colon   COPD Maternal Grandmother    Diabetes Maternal Grandmother    Hypertension Maternal Grandmother      Current Outpatient Medications:    potassium chloride SA (KLOR-CON M) 20 MEQ tablet, Take 1 tablet (20  mEq total) by mouth daily., Disp: 14 tablet, Rfl: 0   amLODipine (NORVASC) 10 MG tablet, Take 1 tablet (10 mg total) by mouth daily. For blood pressure., Disp: 90 tablet, Rfl: 0   aspirin EC 81 MG tablet, Take 81 mg by mouth daily., Disp: , Rfl:    baclofen (LIORESAL) 10 MG tablet, Take 1 tablet (10 mg total) by mouth 4 (four) times daily as needed for muscle spasms., Disp: 360 tablet, Rfl: 0   dexamethasone (DECADRON) 2 MG tablet, Take 5 tablets (10 mg total) by mouth once a week. Take with food., Disp: 20 tablet, Rfl: 2   lenalidomide (REVLIMID) 15 MG capsule, Take 1 capsule (15 mg total) by mouth daily. Take for 21 days, then hold for 7 days. Repeat every 28 days., Disp: 21 capsule, Rfl: 0   OLANZapine (ZYPREXA) 10 MG tablet, Take 1 tablet (10 mg total) by mouth at bedtime., Disp: 30 tablet, Rfl: 1   olmesartan (BENICAR) 20 MG tablet, Take 1 tablet (20 mg total) by mouth daily. For blood pressure., Disp: 90 tablet, Rfl: 3   oxyCODONE (OXY IR/ROXICODONE) 5 MG immediate release tablet, Take 1 tablet (5 mg total) by mouth every 8 (eight) hours as needed for severe pain. Must last 30 days. (Patient not taking: Reported on 09/04/2021), Disp: 90 tablet, Rfl: 0    potassium chloride SA (KLOR-CON M) 20 MEQ tablet, Take 1 tablet (20 mEq total) by mouth daily for 14 days., Disp: 14 tablet, Rfl: 0   pregabalin (LYRICA) 150 MG capsule, Take 1 capsule (150 mg total) by mouth 3 (three) times daily as needed (for pain)., Disp: 270 capsule, Rfl: 0   prochlorperazine (COMPAZINE) 10 MG tablet, Take 1 tablet (10 mg total) by mouth every 6 (six) hours as needed for nausea or vomiting., Disp: 60 tablet, Rfl: 0   sertraline (ZOLOFT) 25 MG tablet, TAKE 1 TABLET BY MOUTH ONCE DAILY, Disp: 30 tablet, Rfl: 3   SUMAtriptan (IMITREX) 50 MG tablet, Take 1 tablet by mouth at migraine onset. May repeat in 2 hours if headache persists or recurs., Disp: 10 tablet, Rfl: 0   topiramate (TOPAMAX) 50 MG tablet, Take 1 tablet (50 mg total) by mouth at bedtime. For headache prevention., Disp: 90 tablet, Rfl: 0  Physical exam:  Vitals:   10/22/21 1300 10/22/21 1311  BP:  134/79  Pulse:  72  Resp:  16  Temp:  98.2 F (36.8 C)  TempSrc:  Oral  SpO2:  100%  Weight: 147 lb (66.7 kg)    Physical Exam Cardiovascular:     Rate and Rhythm: Normal rate and regular rhythm.     Heart sounds: Normal heart sounds.  Pulmonary:     Effort: Pulmonary effort is normal.     Breath sounds: Normal breath sounds.  Abdominal:     General: Bowel sounds are normal.     Palpations: Abdomen is soft.  Skin:    General: Skin is warm and dry.  Neurological:     Mental Status: He is alert and oriented to person, place, and time.         Latest Ref Rng & Units 10/22/2021   12:48 PM  CMP  Glucose 70 - 99 mg/dL 138   BUN 6 - 20 mg/dL 15   Creatinine 0.61 - 1.24 mg/dL 1.38   Sodium 135 - 145 mmol/L 140   Potassium 3.5 - 5.1 mmol/L 3.3   Chloride 98 - 111 mmol/L 109   CO2 22 - 32  mmol/L 23   Calcium 8.9 - 10.3 mg/dL 8.7   Total Protein 6.5 - 8.1 g/dL 6.4   Total Bilirubin 0.3 - 1.2 mg/dL 0.9   Alkaline Phos 38 - 126 U/L 100   AST 15 - 41 U/L 19   ALT 0 - 44 U/L 17       Latest Ref Rng &  Units 10/22/2021   12:48 PM  CBC  WBC 4.0 - 10.5 K/uL 3.7   Hemoglobin 13.0 - 17.0 g/dL 15.5   Hematocrit 39.0 - 52.0 % 44.8   Platelets 150 - 400 K/uL 155      Assessment and plan- Patient is a 51 y.o. male with history ofof kappa light chain multiple myeloma s/p CyBorD chemotherapy followed by autologous stem cell transplantation previously on Revlimid maintenance now concerning for relapse here for routine follow-up  There has been a slow increase in his serum free light chains from 15In September 2021 presently to 87 with a rising free light chain ratio concerning for slow relapse of his myeloma.  PET scan did not show any active lytic lesions.  I did increase his maintenance dose of Revlimid from 2.5 mg to 15 mg presently 3 weeks on 1 week off.  He also has an upcoming appointment at [facility] where he had an autoLog a stem cell transplant this month.  I will await the recommendations to see if we need to add Darzalex to his regimen or continue with Revlimid single agent.  Free light chains from today are pending.  I will see him back in 1 month with CBC with differential CMP myeloma panel and serum free light chains  Nausea: We will send him a prescription for as needed Compazine.  He also has olanzapine which she takes nightly.  Patient has longstanding chemo-induced peripheral neuropathy for which she sees Dr. Dossie Arbour.  Presently patient reports new symptoms of numbness in his right palm below the thumb.  Patient is already on Lyrica as well.  I will refer him to Dr. Mickeal Skinner for further evaluation  Hypokalemia: He will take oral potassium 20 mEq daily for 2 weeks.  Possibly secondary to Revlimid   Visit Diagnosis 1. High risk medication use   2. Multiple myeloma in relapse [facility])      Dr. Randa Evens, MD, MPH [facility] at [facility] [phone] 10/22/2021 3:07 PM

## 2021-10-22 NOTE — Progress Notes (Signed)
Returns for follow-up. He reports diarrhea, nausea, and insomnia since starting revlimid. Reports that he is getting diarrhea almost after every meal. He also feels that his neuropathy is getting worse.

## 2021-10-24 LAB — KAPPA/LAMBDA LIGHT CHAINS
Kappa free light chain: 47.7 mg/L — ABNORMAL HIGH (ref 3.3–19.4)
Kappa, lambda light chain ratio: 2.65 — ABNORMAL HIGH (ref 0.26–1.65)
Lambda free light chains: 18 mg/L (ref 5.7–26.3)

## 2021-10-25 ENCOUNTER — Other Ambulatory Visit: Payer: Self-pay | Admitting: Oncology

## 2021-10-25 ENCOUNTER — Other Ambulatory Visit: Payer: Self-pay

## 2021-10-26 ENCOUNTER — Other Ambulatory Visit: Payer: Self-pay

## 2021-10-28 ENCOUNTER — Other Ambulatory Visit: Payer: Self-pay

## 2021-10-29 DIAGNOSIS — N189 Chronic kidney disease, unspecified: Secondary | ICD-10-CM | POA: Diagnosis not present

## 2021-10-29 DIAGNOSIS — G629 Polyneuropathy, unspecified: Secondary | ICD-10-CM | POA: Diagnosis not present

## 2021-10-29 DIAGNOSIS — R197 Diarrhea, unspecified: Secondary | ICD-10-CM | POA: Diagnosis not present

## 2021-10-29 DIAGNOSIS — Z89412 Acquired absence of left great toe: Secondary | ICD-10-CM | POA: Diagnosis not present

## 2021-10-29 DIAGNOSIS — Z87891 Personal history of nicotine dependence: Secondary | ICD-10-CM | POA: Diagnosis not present

## 2021-10-29 DIAGNOSIS — I129 Hypertensive chronic kidney disease with stage 1 through stage 4 chronic kidney disease, or unspecified chronic kidney disease: Secondary | ICD-10-CM | POA: Diagnosis not present

## 2021-10-29 DIAGNOSIS — Z79899 Other long term (current) drug therapy: Secondary | ICD-10-CM | POA: Diagnosis not present

## 2021-10-29 DIAGNOSIS — K279 Peptic ulcer, site unspecified, unspecified as acute or chronic, without hemorrhage or perforation: Secondary | ICD-10-CM | POA: Diagnosis not present

## 2021-10-29 DIAGNOSIS — C9000 Multiple myeloma not having achieved remission: Secondary | ICD-10-CM | POA: Diagnosis not present

## 2021-10-29 DIAGNOSIS — M1990 Unspecified osteoarthritis, unspecified site: Secondary | ICD-10-CM | POA: Diagnosis not present

## 2021-10-29 DIAGNOSIS — Z7961 Long term (current) use of immunomodulator: Secondary | ICD-10-CM | POA: Diagnosis not present

## 2021-10-29 DIAGNOSIS — M8580 Other specified disorders of bone density and structure, unspecified site: Secondary | ICD-10-CM | POA: Diagnosis not present

## 2021-10-30 ENCOUNTER — Other Ambulatory Visit: Payer: Self-pay

## 2021-10-30 ENCOUNTER — Encounter: Payer: Self-pay | Admitting: Oncology

## 2021-10-30 LAB — MULTIPLE MYELOMA PANEL, SERUM
Albumin SerPl Elph-Mcnc: 3.6 g/dL (ref 2.9–4.4)
Albumin/Glob SerPl: 1.6 (ref 0.7–1.7)
Alpha 1: 0.2 g/dL (ref 0.0–0.4)
Alpha2 Glob SerPl Elph-Mcnc: 0.7 g/dL (ref 0.4–1.0)
B-Globulin SerPl Elph-Mcnc: 0.9 g/dL (ref 0.7–1.3)
Gamma Glob SerPl Elph-Mcnc: 0.7 g/dL (ref 0.4–1.8)
Globulin, Total: 2.4 g/dL (ref 2.2–3.9)
IgA: 157 mg/dL (ref 90–386)
IgG (Immunoglobin G), Serum: 805 mg/dL (ref 603–1613)
IgM (Immunoglobulin M), Srm: 71 mg/dL (ref 20–172)
Total Protein ELP: 6 g/dL (ref 6.0–8.5)

## 2021-10-30 MED FILL — Sertraline HCl Tab 25 MG: ORAL | 30 days supply | Qty: 30 | Fill #0 | Status: AC

## 2021-11-01 ENCOUNTER — Encounter: Payer: Self-pay | Admitting: Oncology

## 2021-11-01 MED ORDER — LENALIDOMIDE 15 MG PO CAPS: 15.0000 mg | ORAL_CAPSULE | Freq: Every day | ORAL | 0 refills | Status: DC

## 2021-11-02 ENCOUNTER — Other Ambulatory Visit: Payer: Self-pay

## 2021-11-09 ENCOUNTER — Other Ambulatory Visit: Payer: Self-pay | Admitting: *Deleted

## 2021-11-09 ENCOUNTER — Encounter: Payer: Self-pay | Admitting: Oncology

## 2021-11-09 ENCOUNTER — Other Ambulatory Visit: Payer: Self-pay

## 2021-11-09 DIAGNOSIS — C9000 Multiple myeloma not having achieved remission: Secondary | ICD-10-CM

## 2021-11-09 MED ORDER — DEXAMETHASONE 2 MG PO TABS
10.0000 mg | ORAL_TABLET | ORAL | 2 refills | Status: DC
  Filled 2021-11-09: qty 20, 28d supply, fill #0

## 2021-11-12 DIAGNOSIS — C9000 Multiple myeloma not having achieved remission: Secondary | ICD-10-CM | POA: Diagnosis not present

## 2021-11-12 DIAGNOSIS — Z87891 Personal history of nicotine dependence: Secondary | ICD-10-CM | POA: Diagnosis not present

## 2021-11-19 ENCOUNTER — Encounter: Payer: Self-pay | Admitting: *Deleted

## 2021-11-19 ENCOUNTER — Other Ambulatory Visit: Payer: Self-pay

## 2021-11-19 DIAGNOSIS — C9000 Multiple myeloma not having achieved remission: Secondary | ICD-10-CM

## 2021-11-20 ENCOUNTER — Inpatient Hospital Stay: Payer: BC Managed Care – PPO

## 2021-11-20 ENCOUNTER — Inpatient Hospital Stay: Payer: BC Managed Care – PPO | Admitting: Oncology

## 2021-11-20 DIAGNOSIS — Z9481 Bone marrow transplant status: Secondary | ICD-10-CM | POA: Diagnosis not present

## 2021-11-20 DIAGNOSIS — C9000 Multiple myeloma not having achieved remission: Secondary | ICD-10-CM | POA: Diagnosis not present

## 2021-11-20 DIAGNOSIS — E876 Hypokalemia: Secondary | ICD-10-CM | POA: Diagnosis not present

## 2021-11-21 ENCOUNTER — Other Ambulatory Visit: Payer: Self-pay | Admitting: *Deleted

## 2021-11-21 ENCOUNTER — Ambulatory Visit (INDEPENDENT_AMBULATORY_CARE_PROVIDER_SITE_OTHER): Payer: BC Managed Care – PPO | Admitting: Primary Care

## 2021-11-21 ENCOUNTER — Telehealth: Payer: Self-pay | Admitting: *Deleted

## 2021-11-21 VITALS — BP 134/82 | HR 84 | Temp 98.1°F | Ht 67.0 in | Wt 147.0 lb

## 2021-11-21 DIAGNOSIS — Z0000 Encounter for general adult medical examination without abnormal findings: Secondary | ICD-10-CM | POA: Diagnosis not present

## 2021-11-21 DIAGNOSIS — G894 Chronic pain syndrome: Secondary | ICD-10-CM

## 2021-11-21 DIAGNOSIS — G620 Drug-induced polyneuropathy: Secondary | ICD-10-CM | POA: Diagnosis not present

## 2021-11-21 DIAGNOSIS — Z125 Encounter for screening for malignant neoplasm of prostate: Secondary | ICD-10-CM

## 2021-11-21 DIAGNOSIS — R5382 Chronic fatigue, unspecified: Secondary | ICD-10-CM

## 2021-11-21 DIAGNOSIS — F411 Generalized anxiety disorder: Secondary | ICD-10-CM

## 2021-11-21 DIAGNOSIS — Z79899 Other long term (current) drug therapy: Secondary | ICD-10-CM

## 2021-11-21 DIAGNOSIS — I10 Essential (primary) hypertension: Secondary | ICD-10-CM | POA: Diagnosis not present

## 2021-11-21 DIAGNOSIS — C9000 Multiple myeloma not having achieved remission: Secondary | ICD-10-CM

## 2021-11-21 DIAGNOSIS — T451X5A Adverse effect of antineoplastic and immunosuppressive drugs, initial encounter: Secondary | ICD-10-CM

## 2021-11-21 DIAGNOSIS — Z23 Encounter for immunization: Secondary | ICD-10-CM | POA: Diagnosis not present

## 2021-11-21 DIAGNOSIS — M5137 Other intervertebral disc degeneration, lumbosacral region: Secondary | ICD-10-CM

## 2021-11-21 DIAGNOSIS — R519 Headache, unspecified: Secondary | ICD-10-CM

## 2021-11-21 NOTE — Addendum Note (Signed)
Addended by: Francella Solian on: 11/21/2021 09:55 AM   Modules accepted: Orders

## 2021-11-21 NOTE — Assessment & Plan Note (Signed)
Shingrix due, other vaccines UTD. PSA due and pending. Colonoscopy UTD, due 2033.  Discussed the importance of a healthy diet and regular exercise in order for weight loss, and to reduce the risk of further co-morbidity.  Exam stable. Labs reviewed  Follow up in 1 year for repeat physical.

## 2021-11-21 NOTE — Progress Notes (Signed)
Subjective:    Patient ID: James House., male    DOB: 1971/02/13, 51 y.o.   MRN: [id number]  HPI  James House. is a very pleasant 51 y.o. male who presents today for complete physical and follow up of chronic conditions.  Immunizations: -Tetanus: 2021 -Influenza: Did not complete last season  -Covid-19: Has not completed -Shingles: Never completed   Diet: Fair diet.  Exercise: No regular exercise.  Eye exam: Completed several years ago Dental exam: Completed several years ago  Colonoscopy: Completed in 2023, due 2033  PSA: Due  BP Readings from Last 3 Encounters:  11/21/21 134/82  10/22/21 134/79  09/21/21 (!) 154/98         Review of Systems  Constitutional:  Negative for unexpected weight change.  HENT:  Negative for rhinorrhea.   Respiratory:  Negative for cough and shortness of breath.   Cardiovascular:  Negative for chest pain.  Gastrointestinal:  Negative for constipation and diarrhea.  Genitourinary:  Negative for difficulty urinating.  Musculoskeletal:  Positive for arthralgias and back pain.  Skin:  Negative for rash.  Allergic/Immunologic: Negative for environmental allergies.  Neurological:  Positive for numbness. Negative for dizziness and headaches.  Psychiatric/Behavioral:  The patient is nervous/anxious.          Past Medical History:  Diagnosis Date   CKD (chronic kidney disease) stage 3, GFR 30-59 ml/min (HCC)    Hypertension    Multiple myeloma ([place]) 08/11/2017   Neuropathy    Pneumonia    Severe sepsis ([place]) 08/11/2017   Shingles 08/03/2019    Social History   Socioeconomic History   Marital status: Legally Separated    Spouse name: [name]   Number of children: 3   Years of education: Not on file   Highest education level: Not on file  Occupational History   Not on file  Tobacco Use   Smoking status: Former   Smokeless tobacco: Current    Types: Snuff  Vaping Use   Vaping Use: Never used  Substance and Sexual  Activity   Alcohol use: No   Drug use: No   Sexual activity: Yes  Other Topics Concern   Not on file  Social History Narrative   Live in private residence with spouse and mother in law   Social Determinants of Health   Financial Resource Strain: Low Risk  (08/12/2017)   Overall Financial Resource Strain (CARDIA)    Difficulty of Paying Living Expenses: Not hard at all  Food Insecurity: No Food Insecurity (08/12/2017)   Hunger Vital Sign    Worried About Running Out of Food in the Last Year: Never true    [place] in the Last Year: Never true  Transportation Needs: No Transportation Needs (08/12/2017)   PRAPARE - Hydrologist (Medical): No    Lack of Transportation (Non-Medical): No  Physical Activity: Sufficiently Active (08/12/2017)   Exercise Vital Sign    Days of Exercise per Week: 7 days    Minutes of Exercise per Session: 30 min  Stress: No Stress Concern Present (08/12/2017)   [place]    Feeling of Stress : Only a little  Social Connections: Somewhat Isolated (08/12/2017)   Social Connection and Isolation Panel [NHANES]    Frequency of Communication with Friends and Family: Once a week    Frequency of Social Gatherings with Friends and Family: Once a week  Attends Religious Services: More than 4 times per year    Active Member of Clubs or Organizations: No    Attends Archivist Meetings: Never    Marital Status: Married  Human resources officer Violence: Not At Risk (08/12/2017)   Humiliation, Afraid, Rape, and Kick questionnaire    Fear of Current or Ex-Partner: No    Emotionally Abused: No    Physically Abused: No    Sexually Abused: No    Past Surgical History:  Procedure Laterality Date   ABDOMINAL SURGERY     COLONOSCOPY WITH PROPOFOL N/A 05/25/2021   Procedure: COLONOSCOPY WITH PROPOFOL;  Surgeon: Lin Landsman, MD;  Location: [facility] ENDOSCOPY;  Service:  Gastroenterology;  Laterality: N/A;   JOINT REPLACEMENT     right knee   KNEE SURGERY Left     Family History  Problem Relation Age of Onset   Cancer Mother 56       Pancreatic   COPD Mother    Diabetes Mother    Hyperlipidemia Mother    Hypertension Mother    Cancer Maternal Uncle        pancreatic   Cancer Maternal Grandmother 56       colon   COPD Maternal Grandmother    Diabetes Maternal Grandmother    Hypertension Maternal Grandmother     No Known Allergies  Current Outpatient Medications on File Prior to Visit  Medication Sig Dispense Refill   amLODipine (NORVASC) 10 MG tablet Take 1 tablet (10 mg total) by mouth daily. For blood pressure. 90 tablet 0   aspirin EC 81 MG tablet Take 81 mg by mouth daily.     baclofen (LIORESAL) 10 MG tablet Take 1 tablet (10 mg total) by mouth 4 (four) times daily as needed for muscle spasms. 360 tablet 0   dexamethasone (DECADRON) 2 MG tablet Take 5 tablets (10 mg total) by mouth once a week. Take with food. 20 tablet 2   lenalidomide (REVLIMID) 15 MG capsule Take 1 capsule (15 mg total) by mouth daily. Take for 21 days, then hold for 7 days. Repeat every 28 days. 21 capsule 0   olmesartan (BENICAR) 20 MG tablet Take 1 tablet (20 mg total) by mouth daily. For blood pressure. 90 tablet 3   potassium chloride SA (KLOR-CON M) 20 MEQ tablet Take 1 tablet (20 mEq total) by mouth daily. 14 tablet 0   pregabalin (LYRICA) 150 MG capsule Take 1 capsule (150 mg total) by mouth 3 (three) times daily as needed (for pain). 270 capsule 0   prochlorperazine (COMPAZINE) 10 MG tablet Take 1 tablet (10 mg total) by mouth every 6 (six) hours as needed for nausea or vomiting. 60 tablet 0   sertraline (ZOLOFT) 25 MG tablet TAKE 1 TABLET BY MOUTH ONCE DAILY 30 tablet 3   SUMAtriptan (IMITREX) 50 MG tablet Take 1 tablet by mouth at migraine onset. May repeat in 2 hours if headache persists or recurs. 10 tablet 0   topiramate (TOPAMAX) 50 MG tablet Take 1 tablet  (50 mg total) by mouth at bedtime. For headache prevention. 90 tablet 0   DULoxetine (CYMBALTA) 30 MG capsule Take 30 mg by mouth daily.     No current facility-administered medications on file prior to visit.    BP 134/82   Pulse 84   Temp 98.1 F (36.7 C) (Oral)   Ht '5\' 7"'  (1.702 m)   Wt 147 lb (66.7 kg)   SpO2 98%   BMI 23.02 kg/m  Objective:   Physical Exam HENT:     Right Ear: Tympanic membrane and ear canal normal.     Left Ear: Tympanic membrane and ear canal normal.     Nose: Nose normal.     Right Sinus: No maxillary sinus tenderness or frontal sinus tenderness.     Left Sinus: No maxillary sinus tenderness or frontal sinus tenderness.  Eyes:     Conjunctiva/sclera: Conjunctivae normal.  Neck:     Thyroid: No thyromegaly.     Vascular: No carotid bruit.  Cardiovascular:     Rate and Rhythm: Normal rate and regular rhythm.     Heart sounds: Normal heart sounds.  Pulmonary:     Effort: Pulmonary effort is normal.     Breath sounds: Normal breath sounds. No wheezing or rales.  Abdominal:     General: Bowel sounds are normal.     Palpations: Abdomen is soft.     Tenderness: There is no abdominal tenderness.  Musculoskeletal:     Cervical back: Neck supple.     Thoracic back: Decreased range of motion.     Lumbar back: Decreased range of motion.  Skin:    General: Skin is warm and dry.  Neurological:     Mental Status: He is alert and oriented to person, place, and time.     Cranial Nerves: No cranial nerve deficit.     Deep Tendon Reflexes: Reflexes are normal and symmetric.  Psychiatric:        Mood and Affect: Mood normal.           Assessment & Plan:   Problem List Items Addressed This Visit       Cardiovascular and Mediastinum   HTN (hypertension)    Overall stable.  Continue amlodipine 10 mg daily.        Nervous and Auditory   Chemotherapy-induced peripheral neuropathy (HCC) (Chronic)    Following with oncology who recently initiated  Cymbalta 30 mg daily.  Continue Cymbalta 30 mg daily, Lyrica 150 mg TID.       Relevant Medications   DULoxetine (CYMBALTA) 30 MG capsule     Musculoskeletal and Integument   DDD (degenerative disc disease), lumbosacral (Chronic)    Following with pain management. Continue off oxycodone IR 5 mg.  Continue Baclofen 10 mg QID, Lyrica 150 mg TID, Cymbalta 30 mg daily.        Other   Multiple myeloma ([place]) (Chronic)    Following with oncology, office notes reviewed from July 2023.  Continue Revlimid 15 mg daily x21 days, then hold for 7 days.  Repeat every 28 days. Continue Compazine 10 mg as needed. Continue potassium chloride 20 mEq daily. Continue decadron 2 mg once weekly.      Chronic pain syndrome (Chronic)    No longer on Oxycodone IR 5 mg! Commended him for choosing to resume.   Continue Lyrica 150 mg TID, Baclofen 10 mg QID, and Cymbalta 30 mg daily.      Relevant Medications   DULoxetine (CYMBALTA) 30 MG capsule   Preventative health care - Primary    Shingrix due, other vaccines UTD. PSA due and pending. Colonoscopy UTD, due 2033.  Discussed the importance of a healthy diet and regular exercise in order for weight loss, and to reduce the risk of further co-morbidity.  Exam stable. Labs reviewed  Follow up in 1 year for repeat physical.       GAD (generalized anxiety disorder)    Overall stable.  Continue sertraline 25 mg daily and Cymbalta 30 mg daily. Consider discontinuing sertraline 25 mg and increasing Cymbalta to 60 mg in the future for better control over neuropathy and anxiety. He will update in 1 month as he's only been taking Cymbalta for 1 month.      Relevant Medications   DULoxetine (CYMBALTA) 30 MG capsule   Frequent headaches    Continues to notice improvement!  Continue Topamax 50 mg HS. Continue sumatriptan 50 mg PRN.  Continue to monitor.       Relevant Medications   DULoxetine (CYMBALTA) 30 MG capsule   Other Visit  Diagnoses     Screening for prostate cancer       Relevant Orders   PSA          Pleas Koch, NP

## 2021-11-21 NOTE — Assessment & Plan Note (Signed)
Following with pain management. Continue off oxycodone IR 5 mg.  Continue Baclofen 10 mg QID, Lyrica 150 mg TID, Cymbalta 30 mg daily.

## 2021-11-21 NOTE — Assessment & Plan Note (Signed)
Following with oncology who recently initiated Cymbalta 30 mg daily.  Continue Cymbalta 30 mg daily, Lyrica 150 mg TID.

## 2021-11-21 NOTE — Telephone Encounter (Signed)
Called the pt and let I'm know that Dr. Janese Banks saw the bone marrow biopsy results and reviewed the notes from MD at [facility] about his myeloma. She says that she will move his appt out to Sept 12 arrive 9:15 for labs and then see Dr Janese Banks. Will still stay on revlimid. He states that he is tired today. I asked if it usual . He says he always have some but more today. I told him that I asked Janese Banks and we can add b12 level next time and see if he needs b12 med. He is ok with this

## 2021-11-21 NOTE — Assessment & Plan Note (Signed)
Overall stable.  Continue amlodipine 10 mg daily.

## 2021-11-21 NOTE — Assessment & Plan Note (Signed)
Continues to notice improvement!  Continue Topamax 50 mg HS. Continue sumatriptan 50 mg PRN.  Continue to monitor.

## 2021-11-21 NOTE — Assessment & Plan Note (Signed)
No longer on Oxycodone IR 5 mg! Commended him for choosing to resume.   Continue Lyrica 150 mg TID, Baclofen 10 mg QID, and Cymbalta 30 mg daily.

## 2021-11-21 NOTE — Assessment & Plan Note (Signed)
Following with oncology, office notes reviewed from July 2023.  Continue Revlimid 15 mg daily x21 days, then hold for 7 days.  Repeat every 28 days. Continue Compazine 10 mg as needed. Continue potassium chloride 20 mEq daily. Continue decadron 2 mg once weekly.

## 2021-11-21 NOTE — Patient Instructions (Addendum)
Schedule a nurse visit to complete your second Shingrix vaccine in 2 to 6 months.  Have the cancer center draw the PSA level for [place].   It was a pleasure to see you today!  Preventive Care 51-51 Years Old, Male Preventive care refers to lifestyle choices and visits with your health care provider that can promote health and wellness. Preventive care visits are also called wellness exams. What can I expect for my preventive care visit? Counseling During your preventive care visit, your health care provider may ask about your: Medical history, including: Past medical problems. Family medical history. Current health, including: Emotional well-being. Home life and relationship well-being. Sexual activity. Lifestyle, including: Alcohol, nicotine or tobacco, and drug use. Access to firearms. Diet, exercise, and sleep habits. Safety issues such as seatbelt and bike helmet use. Sunscreen use. Work and work Statistician. Physical exam Your health care provider will check your: Height and weight. These may be used to calculate your BMI (body mass index). BMI is a measurement that tells if you are at a healthy weight. Waist circumference. This measures the distance around your waistline. This measurement also tells if you are at a healthy weight and may help predict your risk of certain diseases, such as type 2 diabetes and high blood pressure. Heart rate and blood pressure. Body temperature. Skin for abnormal spots. What immunizations do I need?  Vaccines are usually given at various ages, according to a schedule. Your health care provider will recommend vaccines for you based on your age, medical history, and lifestyle or other factors, such as travel or where you work. What tests do I need? Screening Your health care provider may recommend screening tests for certain conditions. This may include: Lipid and cholesterol levels. Diabetes screening. This is done by checking your blood sugar  (glucose) after you have not eaten for a while (fasting). Hepatitis B test. Hepatitis C test. HIV (human immunodeficiency virus) test. STI (sexually transmitted infection) testing, if you are at risk. Lung cancer screening. Prostate cancer screening. Colorectal cancer screening. Talk with your health care provider about your test results, treatment options, and if necessary, the need for more tests. Follow these instructions at home: Eating and drinking  Eat a diet that includes fresh fruits and vegetables, whole grains, lean protein, and low-fat dairy products. Take vitamin and mineral supplements as recommended by your health care provider. Do not drink alcohol if your health care provider tells you not to drink. If you drink alcohol: Limit how much you have to 0-2 drinks a day. Know how much alcohol is in your drink. In the [place] one drink equals one 12 oz bottle of beer (355 mL), one 5 oz glass of wine (148 mL), or one 1 oz glass of hard liquor (44 mL). Lifestyle Brush your teeth every morning and night with fluoride toothpaste. Floss one time each day. Exercise for at least 30 minutes 5 or more days each week. Do not use any products that contain nicotine or tobacco. These products include cigarettes, chewing tobacco, and vaping devices, such as e-cigarettes. If you need help quitting, ask your health care provider. Do not use drugs. If you are sexually active, practice safe sex. Use a condom or other form of protection to prevent STIs. Take aspirin only as told by your health care provider. Make sure that you understand how much to take and what form to take. Work with your health care provider to find out whether it is safe and beneficial for you  to take aspirin daily. Find healthy ways to manage stress, such as: Meditation, yoga, or listening to music. Journaling. Talking to a trusted person. Spending time with friends and family. Minimize exposure to UV radiation to reduce  your risk of skin cancer. Safety Always wear your seat belt while driving or riding in a vehicle. Do not drive: If you have been drinking alcohol. Do not ride with someone who has been drinking. When you are tired or distracted. While texting. If you have been using any mind-altering substances or drugs. Wear a helmet and other protective equipment during sports activities. If you have firearms in your house, make sure you follow all gun safety procedures. What's next? Go to your health care provider once a year for an annual wellness visit. Ask your health care provider how often you should have your eyes and teeth checked. Stay up to date on all vaccines. This information is not intended to replace advice given to you by your health care provider. Make sure you discuss any questions you have with your health care provider. Document Revised: 10/04/2020 Document Reviewed: 10/04/2020 Elsevier Patient Education  [place].

## 2021-11-21 NOTE — Assessment & Plan Note (Signed)
Overall stable.   Continue sertraline 25 mg daily and Cymbalta 30 mg daily. Consider discontinuing sertraline 25 mg and increasing Cymbalta to 60 mg in the future for better control over neuropathy and anxiety. He will update in 1 month as he's only been taking Cymbalta for 1 month.

## 2021-11-24 ENCOUNTER — Encounter: Payer: Self-pay | Admitting: *Deleted

## 2021-11-24 ENCOUNTER — Other Ambulatory Visit: Payer: Self-pay | Admitting: *Deleted

## 2021-11-26 ENCOUNTER — Other Ambulatory Visit: Payer: Self-pay | Admitting: *Deleted

## 2021-11-26 DIAGNOSIS — C9000 Multiple myeloma not having achieved remission: Secondary | ICD-10-CM

## 2021-11-29 ENCOUNTER — Other Ambulatory Visit: Payer: Self-pay

## 2021-11-29 ENCOUNTER — Other Ambulatory Visit: Payer: Self-pay | Admitting: Primary Care

## 2021-11-29 ENCOUNTER — Encounter: Payer: Self-pay | Admitting: Oncology

## 2021-11-29 DIAGNOSIS — M62830 Muscle spasm of back: Secondary | ICD-10-CM

## 2021-11-29 DIAGNOSIS — G8929 Other chronic pain: Secondary | ICD-10-CM

## 2021-11-29 MED ORDER — LENALIDOMIDE 15 MG PO CAPS: 15.0000 mg | ORAL_CAPSULE | Freq: Every day | ORAL | 0 refills | Status: DC

## 2021-11-30 ENCOUNTER — Other Ambulatory Visit: Payer: Self-pay

## 2021-11-30 ENCOUNTER — Inpatient Hospital Stay: Payer: BC Managed Care – PPO | Attending: Internal Medicine | Admitting: Internal Medicine

## 2021-11-30 MED ORDER — BACLOFEN 10 MG PO TABS
10.0000 mg | ORAL_TABLET | Freq: Four times a day (QID) | ORAL | 0 refills | Status: DC | PRN
  Filled 2021-11-30: qty 360, 90d supply, fill #0

## 2021-12-03 ENCOUNTER — Other Ambulatory Visit: Payer: Self-pay

## 2021-12-03 DIAGNOSIS — C9000 Multiple myeloma not having achieved remission: Secondary | ICD-10-CM | POA: Diagnosis not present

## 2021-12-03 MED FILL — Sertraline HCl Tab 25 MG: ORAL | 30 days supply | Qty: 30 | Fill #1 | Status: AC

## 2021-12-17 ENCOUNTER — Other Ambulatory Visit: Payer: Self-pay | Admitting: *Deleted

## 2021-12-17 DIAGNOSIS — C9000 Multiple myeloma not having achieved remission: Secondary | ICD-10-CM

## 2021-12-17 MED ORDER — LENALIDOMIDE 15 MG PO CAPS: 15.0000 mg | ORAL_CAPSULE | Freq: Every day | ORAL | 0 refills | Status: DC

## 2021-12-19 ENCOUNTER — Encounter: Payer: Self-pay | Admitting: Oncology

## 2021-12-20 ENCOUNTER — Other Ambulatory Visit: Payer: Self-pay | Admitting: *Deleted

## 2021-12-20 DIAGNOSIS — C9000 Multiple myeloma not having achieved remission: Secondary | ICD-10-CM

## 2021-12-20 NOTE — Telephone Encounter (Signed)
I received 2 prescriptions 1 day apart to get new RX of the revlimid. Going on [facility] risk management says that Josem Kaufmann was done on 8/10. It also said that phamacy confirmed the shipment .I spoke  to [place] and she told me that the next time for have a new rx for the revlimid will be 12/26/2021. I am cancelling the 2nd refill until 9/6.

## 2022-01-01 ENCOUNTER — Telehealth: Payer: Self-pay | Admitting: *Deleted

## 2022-01-01 ENCOUNTER — Other Ambulatory Visit: Payer: Self-pay

## 2022-01-01 ENCOUNTER — Other Ambulatory Visit: Payer: Self-pay | Admitting: *Deleted

## 2022-01-01 ENCOUNTER — Inpatient Hospital Stay (HOSPITAL_BASED_OUTPATIENT_CLINIC_OR_DEPARTMENT_OTHER): Payer: BC Managed Care – PPO | Admitting: Oncology

## 2022-01-01 ENCOUNTER — Inpatient Hospital Stay: Payer: BC Managed Care – PPO | Attending: Internal Medicine

## 2022-01-01 ENCOUNTER — Encounter: Payer: Self-pay | Admitting: Oncology

## 2022-01-01 VITALS — BP 149/98 | HR 65 | Temp 97.8°F | Resp 18 | Wt 146.1 lb

## 2022-01-01 DIAGNOSIS — Z800 Family history of malignant neoplasm of digestive organs: Secondary | ICD-10-CM | POA: Diagnosis not present

## 2022-01-01 DIAGNOSIS — Z79899 Other long term (current) drug therapy: Secondary | ICD-10-CM | POA: Diagnosis not present

## 2022-01-01 DIAGNOSIS — Z9484 Stem cells transplant status: Secondary | ICD-10-CM | POA: Insufficient documentation

## 2022-01-01 DIAGNOSIS — Z87891 Personal history of nicotine dependence: Secondary | ICD-10-CM | POA: Diagnosis not present

## 2022-01-01 DIAGNOSIS — C9001 Multiple myeloma in remission: Secondary | ICD-10-CM

## 2022-01-01 DIAGNOSIS — C9000 Multiple myeloma not having achieved remission: Secondary | ICD-10-CM | POA: Insufficient documentation

## 2022-01-01 DIAGNOSIS — G629 Polyneuropathy, unspecified: Secondary | ICD-10-CM | POA: Insufficient documentation

## 2022-01-01 DIAGNOSIS — R5382 Chronic fatigue, unspecified: Secondary | ICD-10-CM

## 2022-01-01 LAB — CBC WITH DIFFERENTIAL/PLATELET
Abs Immature Granulocytes: 0.01 10*3/uL (ref 0.00–0.07)
Basophils Absolute: 0 10*3/uL (ref 0.0–0.1)
Basophils Relative: 1 %
Eosinophils Absolute: 0.1 10*3/uL (ref 0.0–0.5)
Eosinophils Relative: 4 %
HCT: 44.1 % (ref 39.0–52.0)
Hemoglobin: 15.4 g/dL (ref 13.0–17.0)
Immature Granulocytes: 0 %
Lymphocytes Relative: 21 %
Lymphs Abs: 0.8 10*3/uL (ref 0.7–4.0)
MCH: 32.7 pg (ref 26.0–34.0)
MCHC: 34.9 g/dL (ref 30.0–36.0)
MCV: 93.6 fL (ref 80.0–100.0)
Monocytes Absolute: 0.3 10*3/uL (ref 0.1–1.0)
Monocytes Relative: 8 %
Neutro Abs: 2.6 10*3/uL (ref 1.7–7.7)
Neutrophils Relative %: 66 %
Platelets: 187 10*3/uL (ref 150–400)
RBC: 4.71 MIL/uL (ref 4.22–5.81)
RDW: 13.2 % (ref 11.5–15.5)
WBC: 3.9 10*3/uL — ABNORMAL LOW (ref 4.0–10.5)
nRBC: 0 % (ref 0.0–0.2)

## 2022-01-01 LAB — COMPREHENSIVE METABOLIC PANEL
ALT: 36 U/L (ref 0–44)
AST: 41 U/L (ref 15–41)
Albumin: 4 g/dL (ref 3.5–5.0)
Alkaline Phosphatase: 107 U/L (ref 38–126)
Anion gap: 3 — ABNORMAL LOW (ref 5–15)
BUN: 18 mg/dL (ref 6–20)
CO2: 26 mmol/L (ref 22–32)
Calcium: 9.2 mg/dL (ref 8.9–10.3)
Chloride: 110 mmol/L (ref 98–111)
Creatinine, Ser: 1.39 mg/dL — ABNORMAL HIGH (ref 0.61–1.24)
GFR, Estimated: >60 mL/min (ref >60)
Glucose, Bld: 115 mg/dL — ABNORMAL HIGH (ref 70–99)
Potassium: 3.4 mmol/L — ABNORMAL LOW (ref 3.5–5.1)
Sodium: 139 mmol/L (ref 135–145)
Total Bilirubin: 0.8 mg/dL (ref 0.3–1.2)
Total Protein: 6.7 g/dL (ref 6.5–8.1)

## 2022-01-01 LAB — VITAMIN B12: Vitamin B-12: 143 pg/mL — ABNORMAL LOW (ref 180–914)

## 2022-01-01 MED ORDER — CYANOCOBALAMIN 1000 MCG/ML IJ SOLN
1000.0000 ug | INTRAMUSCULAR | 11 refills | Status: DC
  Filled 2022-01-01: qty 1, 30d supply, fill #0
  Filled 2022-03-07: qty 1, 30d supply, fill #1
  Filled 2022-04-19: qty 1, 30d supply, fill #2
  Filled 2022-05-30: qty 1, 30d supply, fill #3
  Filled 2022-08-13: qty 1, 30d supply, fill #4
  Filled 2022-10-14: qty 1, 30d supply, fill #5
  Filled 2022-11-11: qty 1, 30d supply, fill #6
  Filled 2022-12-11: qty 1, 30d supply, fill #7

## 2022-01-01 MED ORDER — DEXAMETHASONE 2 MG PO TABS
10.0000 mg | ORAL_TABLET | ORAL | 2 refills | Status: DC
  Filled 2022-01-01: qty 20, 28d supply, fill #0

## 2022-01-01 MED ORDER — "SYRINGE 25G X 1"" 3 ML MISC"
1.0000 | 0 refills | Status: DC
  Filled 2022-01-01: qty 12, fill #0

## 2022-01-01 MED ORDER — LENALIDOMIDE 10 MG PO CAPS: 10.0000 mg | ORAL_CAPSULE | Freq: Every day | ORAL | 0 refills | Status: DC

## 2022-01-01 NOTE — Telephone Encounter (Signed)
Called the pt and he is ok with getting b12 inj at home and I sent the b12  med and I sent for syringes. Pt aware and knows that he does this every 30 days

## 2022-01-01 NOTE — Telephone Encounter (Signed)
-----   Message from Sindy Guadeloupe, MD sent at 01/01/2022  2:49 PM EDT ----- B12 levels have dropped. Does he wnt to self administer b12 injections or come to cancer center monthly?

## 2022-01-01 NOTE — Progress Notes (Signed)
Hematology/Oncology Consult note [facility]  [phone] [phone]  Patient Care Team: Pleas Koch, NP as PCP - General (Internal Medicine) Vella Redhead, MD as PCP - Hematology/Oncology Sindy Guadeloupe, MD as Consulting Physician (Hematology and Oncology)   Name of the patient: James House  [id number]  [birth year]   Date of visit: 01/01/22  Diagnosis- light chain kappa multiple myeloma s/p ASCT  Chief complaint/ Reason for visit-routine follow-up of multiple myeloma on Revlimid  Heme/Onc history: Patient is a 51 year-old male with a history of kappa light chain myeloma that was treated by [name].  History is as follows:   1.  Patient presented with renal insufficiency with a creatinine of 2.4 in April 2018.  Kidney biopsy showed myeloma cast nephropathy.  Kappa light chain was 3004 and lambda 0.22 with a kappa lambda ratio of 13,000 655.  Skeletal survey showed multiple calvarial and marrow lesions.  Bone marrow biopsy in May 2018 showed 43% plasma cells.  He received CyBorD for cycle 1 in May 2018 and was subsequently switched to RVD.  He received 4 cycles followed by a repeat bone marrow biopsy in August 2018 which showed no increase in plasma cells.   2.  He underwent autologous stem cell transplantation on 01/30/2017.  He was started on Revlimid maintenance 10 mg daily in January 2019.   3.  Labs from January February and March 2019 done at [facility] showed no M spike, normal kappa lambda ratio of 1.06.  He was last seen by them in April 2019.   4. Following that patient was admitted to [facility] for healthcare associated pneumonia and was recently discharged.  He wished to transfer his care to [place] at this time.  He was on maintenance Revlimid 5 mg which was then reduced to 2.5 mg.  Follows up with pain clinic for chemo-induced peripheral neuropathy with Dr. Dossie Arbour.   5.  Noted to have gradually increasing free kappa light  chainFrom 15 in 20 21-39.5 with a ratio of 3.09.  Presently on Revlimid 15 mg 3 weeks on and 1 week off    Interval history-patient reports worsening fatigue with Revlimid.  He has baseline peripheral neuropathy as well and follows up with pain clinic  ECOG PS- 1 Pain scale- 4 Opioid associated constipation- no  Review of systems- Review of Systems  Constitutional:  Positive for malaise/fatigue. Negative for chills, fever and weight loss.  HENT:  Negative for congestion, ear discharge and nosebleeds.   Eyes:  Negative for blurred vision.  Respiratory:  Negative for cough, hemoptysis, sputum production, shortness of breath and wheezing.   Cardiovascular:  Negative for chest pain, palpitations, orthopnea and claudication.  Gastrointestinal:  Negative for abdominal pain, blood in stool, constipation, diarrhea, heartburn, melena, nausea and vomiting.  Genitourinary:  Negative for dysuria, flank pain, frequency, hematuria and urgency.  Musculoskeletal:  Negative for back pain, joint pain and myalgias.  Skin:  Negative for rash.  Neurological:  Positive for sensory change (Peripheral neuropathy). Negative for dizziness, tingling, focal weakness, seizures, weakness and headaches.  Endo/Heme/Allergies:  Does not bruise/bleed easily.  Psychiatric/Behavioral:  Negative for depression and suicidal ideas. The patient does not have insomnia.       No Known Allergies   Past Medical History:  Diagnosis Date   CKD (chronic kidney disease) stage 3, GFR 30-59 ml/min (HCC)    Hypertension    Multiple myeloma ([place]) 08/11/2017   Neuropathy    Pneumonia  Severe sepsis ([place]) 08/11/2017   Shingles 08/03/2019     Past Surgical History:  Procedure Laterality Date   ABDOMINAL SURGERY     COLONOSCOPY WITH PROPOFOL N/A 05/25/2021   Procedure: COLONOSCOPY WITH PROPOFOL;  Surgeon: Lin Landsman, MD;  Location: [facility] ENDOSCOPY;  Service: Gastroenterology;  Laterality: N/A;   JOINT REPLACEMENT      right knee   KNEE SURGERY Left     Social History   Socioeconomic History   Marital status: Legally Separated    Spouse name: [name]   Number of children: 3   Years of education: Not on file   Highest education level: Not on file  Occupational History   Not on file  Tobacco Use   Smoking status: Former   Smokeless tobacco: Current    Types: Snuff  Vaping Use   Vaping Use: Never used  Substance and Sexual Activity   Alcohol use: No   Drug use: No   Sexual activity: Yes  Other Topics Concern   Not on file  Social History Narrative   Live in private residence with spouse and mother in law   Social Determinants of Health   Financial Resource Strain: [place]  (08/12/2017)   Overall Financial Resource Strain (CARDIA)    Difficulty of Paying Living Expenses: Not hard at all  Food Insecurity: No Food Insecurity (08/12/2017)   Hunger Vital Sign    Worried About Running Out of Food in the Last Year: Never true    [place] in the Last Year: Never true  Transportation Needs: No Transportation Needs (08/12/2017)   PRAPARE - Hydrologist (Medical): No    Lack of Transportation (Non-Medical): No  Physical Activity: Sufficiently Active (08/12/2017)   Exercise Vital Sign    Days of Exercise per Week: 7 days    Minutes of Exercise per Session: 30 min  Stress: No Stress Concern Present (08/12/2017)   [place]    Feeling of Stress : Only a little  Social Connections: Somewhat Isolated (08/12/2017)   Social Connection and Isolation Panel [NHANES]    Frequency of Communication with Friends and Family: Once a week    Frequency of Social Gatherings with Friends and Family: Once a week    Attends Religious Services: More than 4 times per year    Active Member of [employer] or Organizations: No    Attends Archivist Meetings: Never    Marital Status: Married  Human resources officer  Violence: Not At Risk (08/12/2017)   Humiliation, Afraid, Rape, and Kick questionnaire    Fear of Current or Ex-Partner: No    Emotionally Abused: No    Physically Abused: No    Sexually Abused: No    Family History  Problem Relation Age of Onset   Cancer Mother 26       Pancreatic   COPD Mother    Diabetes Mother    Hyperlipidemia Mother    Hypertension Mother    Cancer Maternal Uncle        pancreatic   Cancer Maternal Grandmother 69       colon   COPD Maternal Grandmother    Diabetes Maternal Grandmother    Hypertension Maternal Grandmother      Current Outpatient Medications:    amLODipine (NORVASC) 10 MG tablet, Take 1 tablet (10 mg total) by mouth daily. For blood pressure., Disp: 90 tablet, Rfl: 0   aspirin EC  81 MG tablet, Take 81 mg by mouth daily., Disp: , Rfl:    baclofen (LIORESAL) 10 MG tablet, Take 1 tablet (10 mg total) by mouth 4 (four) times daily as needed for muscle spasms., Disp: 360 tablet, Rfl: 0   dexamethasone (DECADRON) 2 MG tablet, Take 5 tablets (10 mg total) by mouth once a week. Take with food., Disp: 20 tablet, Rfl: 2   DULoxetine (CYMBALTA) 30 MG capsule, Take 30 mg by mouth daily., Disp: , Rfl:    lenalidomide (REVLIMID) 15 MG capsule, Take 1 capsule (15 mg total) by mouth daily. Take for 21 days, then hold for 7 days. Repeat every 28 days., Disp: 21 capsule, Rfl: 0   olmesartan (BENICAR) 20 MG tablet, Take 1 tablet (20 mg total) by mouth daily. For blood pressure., Disp: 90 tablet, Rfl: 3   potassium chloride SA (KLOR-CON M) 20 MEQ tablet, Take 1 tablet (20 mEq total) by mouth daily., Disp: 14 tablet, Rfl: 0   pregabalin (LYRICA) 150 MG capsule, Take 1 capsule (150 mg total) by mouth 3 (three) times daily as needed (for pain)., Disp: 270 capsule, Rfl: 0   prochlorperazine (COMPAZINE) 10 MG tablet, Take 1 tablet (10 mg total) by mouth every 6 (six) hours as needed for nausea or vomiting., Disp: 60 tablet, Rfl: 0   sertraline (ZOLOFT) 25 MG tablet,  TAKE 1 TABLET BY MOUTH ONCE DAILY, Disp: 30 tablet, Rfl: 3   SUMAtriptan (IMITREX) 50 MG tablet, Take 1 tablet by mouth at migraine onset. May repeat in 2 hours if headache persists or recurs., Disp: 10 tablet, Rfl: 0   topiramate (TOPAMAX) 50 MG tablet, Take 1 tablet (50 mg total) by mouth at bedtime. For headache prevention., Disp: 90 tablet, Rfl: 0  Physical exam:  Vitals:   01/01/22 0941  BP: (!) 149/98  Pulse: 65  Resp: 18  Temp: 97.8 F (36.6 C)  SpO2: 100%  Weight: 146 lb 1.6 oz (66.3 kg)   Physical Exam Constitutional:      General: He is not in acute distress. Cardiovascular:     Rate and Rhythm: Normal rate and regular rhythm.     Heart sounds: Normal heart sounds.  Pulmonary:     Effort: Pulmonary effort is normal.     Breath sounds: Normal breath sounds.  Abdominal:     General: Bowel sounds are normal.     Palpations: Abdomen is soft.  Skin:    General: Skin is warm and dry.  Neurological:     Mental Status: He is alert and oriented to person, place, and time.         Latest Ref Rng & Units 01/01/2022    9:22 AM  CMP  Glucose 70 - 99 mg/dL 115   BUN 6 - 20 mg/dL 18   Creatinine 0.61 - 1.24 mg/dL 1.39   Sodium 135 - 145 mmol/L 139   Potassium 3.5 - 5.1 mmol/L 3.4   Chloride 98 - 111 mmol/L 110   CO2 22 - 32 mmol/L 26   Calcium 8.9 - 10.3 mg/dL 9.2   Total Protein 6.5 - 8.1 g/dL 6.7   Total Bilirubin 0.3 - 1.2 mg/dL 0.8   Alkaline Phos 38 - 126 U/L 107   AST 15 - 41 U/L 41   ALT 0 - 44 U/L 36       Latest Ref Rng & Units 01/01/2022    9:22 AM  CBC  WBC 4.0 - 10.5 K/uL 3.9   Hemoglobin 13.0 -  17.0 g/dL 15.4   Hematocrit 39.0 - 52.0 % 44.1   Platelets 150 - 400 K/uL 187     Assessment and plan- Patient is a 51 y.o. male Patient is a 51 y.o. male with history ofof kappa light chain multiple myeloma s/p CyBorD chemotherapy followed by autologous stem cell transplantation previously on Revlimid maintenance now concerning for relapse.  He is currently  on Revlimid and here for routine follow-up  I am unable to see care everywhere [place] records.  I did see bone marrow biopsy results on patient's MyChart from [facility] which showed 2% clonal plasma cells.  MRD testing was negative by flow cytometry.  PET CT scan and MRI spine have also not show any evidence of active myeloma.  He has had some consistent increase in his serumFree kappa light chains over the last 6 to 7 months because of which his Revlimid dosing was increased from 5 mg to 15 mg.  I did discuss this with Dr. Tracey Harries from [facility].  Plan is to reduce Revlimid dosing to 10 mg starting next cycle which will begin in 2 weeks time.  He will be taking this 2 weeks on and 1 week off.    Myeloma panel and serum free light chains from today is pending.  I will see him back in 3 months with CBC with differential CMP myeloma panel and serum free light chains   Visit Diagnosis 1. High risk medication use   2. Multiple myeloma in remission [facility])      Dr. Randa Evens, MD, MPH [facility] at [facility] [phone] 01/01/2022 1:03 PM

## 2022-01-01 NOTE — Telephone Encounter (Signed)
Called the pt to let him know that Dr. Janese Banks wanted him to get care everywhere re activated and I called today medical records at [phone] and waited for 63 min. And they came on and told me that the patient has to call so I called him to tell him to please call. He sent me a message that he will do it in am. Also we spoke to Dr. Tracey Harries and spoke to [place] and willing to start you back on revlimid at 10 mg daily for 21 days on and then 1 week off. I ordered that for him ad he is aware

## 2022-01-02 ENCOUNTER — Other Ambulatory Visit: Payer: Self-pay

## 2022-01-02 LAB — KAPPA/LAMBDA LIGHT CHAINS
Kappa free light chain: 83.1 mg/L — ABNORMAL HIGH (ref 3.3–19.4)
Kappa, lambda light chain ratio: 5.69 — ABNORMAL HIGH (ref 0.26–1.65)
Lambda free light chains: 14.6 mg/L (ref 5.7–26.3)

## 2022-01-03 ENCOUNTER — Other Ambulatory Visit: Payer: Self-pay

## 2022-01-03 NOTE — Telephone Encounter (Signed)
Also called dr Tracey Harries office at [facility] and NP got on phone with Dr. Janese Banks and she wanted to see if he can go back to 5 mg revlimid. We were told that he can go to 10 mg instead, pt was told about the next revlimid is 10 mg, pt ok with that

## 2022-01-04 ENCOUNTER — Other Ambulatory Visit: Payer: Self-pay | Admitting: *Deleted

## 2022-01-04 ENCOUNTER — Telehealth: Payer: Self-pay | Admitting: *Deleted

## 2022-01-04 ENCOUNTER — Other Ambulatory Visit: Payer: Self-pay

## 2022-01-04 MED FILL — Sertraline HCl Tab 25 MG: ORAL | 30 days supply | Qty: 30 | Fill #2 | Status: CN

## 2022-01-04 NOTE — Telephone Encounter (Signed)
Pt texted earlier on the week for refill of zoloft and dr Janese Banks was not here I checked with her today and she is ok with that and when I called [facility] pharmacy you had 1 refill and they are getting it ready for you. Pt responded by text and he will get it on monday

## 2022-01-07 ENCOUNTER — Other Ambulatory Visit: Payer: Self-pay

## 2022-01-07 LAB — MULTIPLE MYELOMA PANEL, SERUM
Albumin SerPl Elph-Mcnc: 3.5 g/dL (ref 2.9–4.4)
Albumin/Glob SerPl: 1.5 (ref 0.7–1.7)
Alpha 1: 0.2 g/dL (ref 0.0–0.4)
Alpha2 Glob SerPl Elph-Mcnc: 0.6 g/dL (ref 0.4–1.0)
B-Globulin SerPl Elph-Mcnc: 0.8 g/dL (ref 0.7–1.3)
Gamma Glob SerPl Elph-Mcnc: 0.8 g/dL (ref 0.4–1.8)
Globulin, Total: 2.4 g/dL (ref 2.2–3.9)
IgA: 160 mg/dL (ref 90–386)
IgG (Immunoglobin G), Serum: 793 mg/dL (ref 603–1613)
IgM (Immunoglobulin M), Srm: 49 mg/dL (ref 20–172)
Total Protein ELP: 5.9 g/dL — ABNORMAL LOW (ref 6.0–8.5)

## 2022-01-07 MED FILL — Sertraline HCl Tab 25 MG: ORAL | 30 days supply | Qty: 30 | Fill #2 | Status: AC

## 2022-01-15 ENCOUNTER — Other Ambulatory Visit: Payer: Self-pay

## 2022-01-15 ENCOUNTER — Other Ambulatory Visit: Payer: Self-pay | Admitting: Primary Care

## 2022-01-15 DIAGNOSIS — R519 Headache, unspecified: Secondary | ICD-10-CM

## 2022-01-15 DIAGNOSIS — G43709 Chronic migraine without aura, not intractable, without status migrainosus: Secondary | ICD-10-CM

## 2022-01-16 ENCOUNTER — Other Ambulatory Visit: Payer: Self-pay

## 2022-01-16 MED ORDER — TOPIRAMATE 50 MG PO TABS
50.0000 mg | ORAL_TABLET | Freq: Every day | ORAL | 2 refills | Status: DC
  Filled 2022-01-16: qty 90, 90d supply, fill #0
  Filled 2022-04-17: qty 90, 90d supply, fill #1
  Filled 2022-07-16: qty 90, 90d supply, fill #2

## 2022-01-17 ENCOUNTER — Encounter: Payer: Self-pay | Admitting: Oncology

## 2022-01-17 NOTE — Telephone Encounter (Signed)
TO CLOSE OPEN ENCOUNTER.

## 2022-01-29 ENCOUNTER — Other Ambulatory Visit: Payer: Self-pay

## 2022-01-29 MED ORDER — LENALIDOMIDE 10 MG PO CAPS
10.0000 mg | ORAL_CAPSULE | Freq: Every day | ORAL | 0 refills | Status: DC
  Filled 2022-01-29: qty 21, 21d supply, fill #0

## 2022-02-07 ENCOUNTER — Other Ambulatory Visit: Payer: Self-pay

## 2022-02-07 MED FILL — Sertraline HCl Tab 25 MG: ORAL | 30 days supply | Qty: 30 | Fill #3 | Status: AC

## 2022-02-26 ENCOUNTER — Other Ambulatory Visit: Payer: Self-pay | Admitting: Primary Care

## 2022-02-26 ENCOUNTER — Other Ambulatory Visit: Payer: Self-pay

## 2022-02-26 DIAGNOSIS — Z9481 Bone marrow transplant status: Secondary | ICD-10-CM | POA: Diagnosis not present

## 2022-02-26 DIAGNOSIS — N529 Male erectile dysfunction, unspecified: Secondary | ICD-10-CM | POA: Diagnosis not present

## 2022-02-26 DIAGNOSIS — C9002 Multiple myeloma in relapse: Secondary | ICD-10-CM | POA: Diagnosis not present

## 2022-02-26 DIAGNOSIS — C9000 Multiple myeloma not having achieved remission: Secondary | ICD-10-CM | POA: Diagnosis not present

## 2022-02-26 DIAGNOSIS — M792 Neuralgia and neuritis, unspecified: Secondary | ICD-10-CM

## 2022-02-26 DIAGNOSIS — T451X5A Adverse effect of antineoplastic and immunosuppressive drugs, initial encounter: Secondary | ICD-10-CM

## 2022-02-26 MED ORDER — PREGABALIN 150 MG PO CAPS
150.0000 mg | ORAL_CAPSULE | Freq: Three times a day (TID) | ORAL | 0 refills | Status: DC | PRN
  Filled 2022-02-26: qty 270, 90d supply, fill #0

## 2022-02-28 ENCOUNTER — Other Ambulatory Visit: Payer: Self-pay

## 2022-03-07 ENCOUNTER — Other Ambulatory Visit: Payer: Self-pay

## 2022-03-08 ENCOUNTER — Ambulatory Visit: Payer: BC Managed Care – PPO | Admitting: Oncology

## 2022-03-08 ENCOUNTER — Other Ambulatory Visit: Payer: BC Managed Care – PPO

## 2022-03-22 ENCOUNTER — Other Ambulatory Visit: Payer: Self-pay | Admitting: Oncology

## 2022-03-22 ENCOUNTER — Other Ambulatory Visit: Payer: Self-pay

## 2022-03-22 MED ORDER — SERTRALINE HCL 25 MG PO TABS
25.0000 mg | ORAL_TABLET | Freq: Every day | ORAL | 11 refills | Status: DC
  Filled 2022-03-22: qty 30, 30d supply, fill #0
  Filled 2022-04-26 – 2022-05-30 (×2): qty 30, 30d supply, fill #1
  Filled 2022-07-02: qty 30, 30d supply, fill #2

## 2022-03-24 ENCOUNTER — Other Ambulatory Visit: Payer: Self-pay | Admitting: *Deleted

## 2022-03-24 MED ORDER — LENALIDOMIDE 10 MG PO CAPS: 10.0000 mg | ORAL_CAPSULE | Freq: Every day | ORAL | 0 refills | Status: DC

## 2022-03-26 ENCOUNTER — Other Ambulatory Visit: Payer: Self-pay | Admitting: Primary Care

## 2022-03-26 ENCOUNTER — Other Ambulatory Visit: Payer: Self-pay

## 2022-03-26 DIAGNOSIS — M62830 Muscle spasm of back: Secondary | ICD-10-CM

## 2022-03-26 DIAGNOSIS — G8929 Other chronic pain: Secondary | ICD-10-CM

## 2022-03-27 ENCOUNTER — Other Ambulatory Visit: Payer: Self-pay | Admitting: Primary Care

## 2022-03-27 ENCOUNTER — Other Ambulatory Visit: Payer: Self-pay

## 2022-03-27 DIAGNOSIS — M7918 Myalgia, other site: Secondary | ICD-10-CM

## 2022-03-27 DIAGNOSIS — M62830 Muscle spasm of back: Secondary | ICD-10-CM

## 2022-03-27 MED FILL — Baclofen Tab 10 MG: ORAL | 90 days supply | Qty: 360 | Fill #0 | Status: CN

## 2022-03-27 MED FILL — Baclofen Tab 10 MG: ORAL | 90 days supply | Qty: 360 | Fill #0 | Status: AC

## 2022-03-28 ENCOUNTER — Other Ambulatory Visit: Payer: Self-pay | Admitting: Primary Care

## 2022-03-28 ENCOUNTER — Other Ambulatory Visit: Payer: Self-pay

## 2022-04-02 ENCOUNTER — Encounter: Payer: Self-pay | Admitting: Oncology

## 2022-04-02 ENCOUNTER — Inpatient Hospital Stay: Payer: BC Managed Care – PPO | Attending: Internal Medicine | Admitting: Oncology

## 2022-04-02 ENCOUNTER — Inpatient Hospital Stay: Payer: BC Managed Care – PPO

## 2022-04-02 VITALS — BP 139/93 | HR 68 | Temp 96.6°F | Ht 66.0 in | Wt 145.0 lb

## 2022-04-02 DIAGNOSIS — Z800 Family history of malignant neoplasm of digestive organs: Secondary | ICD-10-CM | POA: Insufficient documentation

## 2022-04-02 DIAGNOSIS — Z87891 Personal history of nicotine dependence: Secondary | ICD-10-CM | POA: Diagnosis not present

## 2022-04-02 DIAGNOSIS — Z79899 Other long term (current) drug therapy: Secondary | ICD-10-CM | POA: Insufficient documentation

## 2022-04-02 DIAGNOSIS — C9002 Multiple myeloma in relapse: Secondary | ICD-10-CM | POA: Insufficient documentation

## 2022-04-02 DIAGNOSIS — C9000 Multiple myeloma not having achieved remission: Secondary | ICD-10-CM

## 2022-04-02 DIAGNOSIS — C9001 Multiple myeloma in remission: Secondary | ICD-10-CM | POA: Diagnosis not present

## 2022-04-02 DIAGNOSIS — Z9484 Stem cells transplant status: Secondary | ICD-10-CM | POA: Diagnosis not present

## 2022-04-02 LAB — CBC WITH DIFFERENTIAL/PLATELET
Abs Immature Granulocytes: 0.02 10*3/uL (ref 0.00–0.07)
Basophils Absolute: 0 10*3/uL (ref 0.0–0.1)
Basophils Relative: 1 %
Eosinophils Absolute: 0.1 10*3/uL (ref 0.0–0.5)
Eosinophils Relative: 4 %
HCT: 41.1 % (ref 39.0–52.0)
Hemoglobin: 13.9 g/dL (ref 13.0–17.0)
Immature Granulocytes: 1 %
Lymphocytes Relative: 19 %
Lymphs Abs: 0.7 10*3/uL (ref 0.7–4.0)
MCH: 31.4 pg (ref 26.0–34.0)
MCHC: 33.8 g/dL (ref 30.0–36.0)
MCV: 93 fL (ref 80.0–100.0)
Monocytes Absolute: 0.4 10*3/uL (ref 0.1–1.0)
Monocytes Relative: 10 %
Neutro Abs: 2.3 10*3/uL (ref 1.7–7.7)
Neutrophils Relative %: 65 %
Platelets: 255 10*3/uL (ref 150–400)
RBC: 4.42 MIL/uL (ref 4.22–5.81)
RDW: 12.3 % (ref 11.5–15.5)
WBC: 3.6 10*3/uL — ABNORMAL LOW (ref 4.0–10.5)
nRBC: 0 % (ref 0.0–0.2)

## 2022-04-02 LAB — COMPREHENSIVE METABOLIC PANEL
ALT: 19 U/L (ref 0–44)
AST: 27 U/L (ref 15–41)
Albumin: 3.5 g/dL (ref 3.5–5.0)
Alkaline Phosphatase: 118 U/L (ref 38–126)
Anion gap: 7 (ref 5–15)
BUN: 17 mg/dL (ref 6–20)
CO2: 25 mmol/L (ref 22–32)
Calcium: 8.6 mg/dL — ABNORMAL LOW (ref 8.9–10.3)
Chloride: 108 mmol/L (ref 98–111)
Creatinine, Ser: 1.36 mg/dL — ABNORMAL HIGH (ref 0.61–1.24)
GFR, Estimated: >60 mL/min (ref >60)
Glucose, Bld: 80 mg/dL (ref 70–99)
Potassium: 3.5 mmol/L (ref 3.5–5.1)
Sodium: 140 mmol/L (ref 135–145)
Total Bilirubin: 0.3 mg/dL (ref 0.3–1.2)
Total Protein: 6.7 g/dL (ref 6.5–8.1)

## 2022-04-02 NOTE — Progress Notes (Signed)
Hematology/Oncology Consult note [facility]  [phone] [phone]  Patient Care Team: Pleas Koch, NP as PCP - General (Internal Medicine) Vella Redhead, MD as PCP - Hematology/Oncology Sindy Guadeloupe, MD as Consulting Physician (Hematology and Oncology)   Name of the patient: James House  [id number]  23-Jan-1971   Date of visit: 04/02/22  Diagnosis- light chain kappa multiple myeloma s/p ASCT   Chief complaint/ Reason for visit-routine follow-up of multiple myeloma on Revlimid  Heme/Onc history: Patient is a 62-b year-old male with a history of kappa light chain myeloma that was treated by [name].  History is as follows:   1.  Patient presented with renal insufficiency with a creatinine of 2.4 in April 2018.  Kidney biopsy showed myeloma cast nephropathy.  Kappa light chain was 3004 and lambda 0.22 with a kappa lambda ratio of 13,000 655.  Skeletal survey showed multiple calvarial and marrow lesions.  Bone marrow biopsy in May 2018 showed 43% plasma cells.  He received CyBorD for cycle 1 in May 2018 and was subsequently switched to RVD.  He received 4 cycles followed by a repeat bone marrow biopsy in August 2018 which showed no increase in plasma cells.   2.  He underwent autologous stem cell transplantation on 01/30/2017.  He was started on Revlimid maintenance 10 mg daily in January 2019.   3.  Labs from January February and March 2019 done at [facility] showed no M spike, normal kappa lambda ratio of 1.06.  He was last seen by them in April 2019.   4. Following that patient was admitted to [facility] for healthcare associated pneumonia and was recently discharged.  He wished to transfer his care to [place] at this time.  He was on maintenance Revlimid 5 mg which was then reduced to 2.5 mg.  Follows up with pain clinic for chemo-induced peripheral neuropathy with Dr. Dossie Arbour.   5.  Noted to have gradually increasing free kappa light  chainFrom 15 in 20 21-39.5 with a ratio of 3.09.  He was seen by [facility] again and underwent a repeat bone marrow biopsy which did not show any evidence of clonal plasma cells.  PET CT scan also did not show any new active lytic lesions in May 2023..  Presently on Revlimid 15 mg 3 weeks on and 1 week off  Interval history-patient is currently on 15 mg of Revlimid 3 weeks on and 1 week off.  He reports loose stools but denies any overt diarrhea.  Reports ongoing fatigue.  Has baseline neuropathy which is mildly worse after increasing the dose of Revlimid.  ECOG PS- 1 Pain scale- 3 Opioid associated constipation- no  Review of systems- Review of Systems  Constitutional:  Positive for malaise/fatigue. Negative for chills, fever and weight loss.  HENT:  Negative for congestion, ear discharge and nosebleeds.   Eyes:  Negative for blurred vision.  Respiratory:  Negative for cough, hemoptysis, sputum production, shortness of breath and wheezing.   Cardiovascular:  Negative for chest pain, palpitations, orthopnea and claudication.  Gastrointestinal:  Negative for abdominal pain, blood in stool, constipation, diarrhea, heartburn, melena, nausea and vomiting.  Genitourinary:  Negative for dysuria, flank pain, frequency, hematuria and urgency.  Musculoskeletal:  Negative for back pain, joint pain and myalgias.  Skin:  Negative for rash.  Neurological:  Positive for sensory change (Peripheral neuropathy). Negative for dizziness, tingling, focal weakness, seizures, weakness and headaches.  Endo/Heme/Allergies:  Does not bruise/bleed easily.  Psychiatric/Behavioral:  Negative for depression and suicidal ideas. The patient does not have insomnia.       No Known Allergies   Past Medical History:  Diagnosis Date   CKD (chronic kidney disease) stage 3, GFR 30-59 ml/min (HCC)    Hypertension    Multiple myeloma ([place]) 08/11/2017   Neuropathy    Pneumonia    Severe sepsis ([place]) 08/11/2017   Shingles  08/03/2019     Past Surgical History:  Procedure Laterality Date   ABDOMINAL SURGERY     COLONOSCOPY WITH PROPOFOL N/A 05/25/2021   Procedure: COLONOSCOPY WITH PROPOFOL;  Surgeon: Lin Landsman, MD;  Location: [facility] ENDOSCOPY;  Service: Gastroenterology;  Laterality: N/A;   JOINT REPLACEMENT     right knee   KNEE SURGERY Left     Social History   Socioeconomic History   Marital status: Legally Separated    Spouse name: [name]   Number of children: 3   Years of education: Not on file   Highest education level: Not on file  Occupational History   Not on file  Tobacco Use   Smoking status: Former   Smokeless tobacco: Current    Types: Snuff  Vaping Use   Vaping Use: Never used  Substance and Sexual Activity   Alcohol use: No   Drug use: No   Sexual activity: Yes  Other Topics Concern   Not on file  Social History Narrative   Live in private residence with spouse and mother in law   Social Determinants of Health   Financial Resource Strain: [place]  (08/12/2017)   Overall Financial Resource Strain (CARDIA)    Difficulty of Paying Living Expenses: Not hard at all  Food Insecurity: No Food Insecurity (08/12/2017)   Hunger Vital Sign    Worried About Running Out of Food in the Last Year: Never true    [place] in the Last Year: Never true  Transportation Needs: No Transportation Needs (08/12/2017)   PRAPARE - Hydrologist (Medical): No    Lack of Transportation (Non-Medical): No  Physical Activity: Sufficiently Active (08/12/2017)   Exercise Vital Sign    Days of Exercise per Week: 7 days    Minutes of Exercise per Session: 30 min  Stress: No Stress Concern Present (08/12/2017)   [place]    Feeling of Stress : Only a little  Social Connections: Somewhat Isolated (08/12/2017)   Social Connection and Isolation Panel [NHANES]    Frequency of Communication with  Friends and Family: Once a week    Frequency of Social Gatherings with Friends and Family: Once a week    Attends Religious Services: More than 4 times per year    Active Member of [employer] or Organizations: No    Attends Archivist Meetings: Never    Marital Status: Married  Human resources officer Violence: Not At Risk (08/12/2017)   Humiliation, Afraid, Rape, and Kick questionnaire    Fear of Current or Ex-Partner: No    Emotionally Abused: No    Physically Abused: No    Sexually Abused: No    Family History  Problem Relation Age of Onset   Cancer Mother 26       Pancreatic   COPD Mother    Diabetes Mother    Hyperlipidemia Mother    Hypertension Mother    Cancer Maternal Uncle        pancreatic   Cancer  Maternal Grandmother 42       colon   COPD Maternal Grandmother    Diabetes Maternal Grandmother    Hypertension Maternal Grandmother      Current Outpatient Medications:    amLODipine (NORVASC) 10 MG tablet, Take 1 tablet (10 mg total) by mouth daily. For blood pressure., Disp: 90 tablet, Rfl: 0   aspirin EC 81 MG tablet, Take 81 mg by mouth daily., Disp: , Rfl:    baclofen (LIORESAL) 10 MG tablet, Take 1 tablet (10 mg total) by mouth 4 (four) times daily as needed for muscle spasms., Disp: 360 tablet, Rfl: 0   cyanocobalamin (VITAMIN B12) 1000 MCG/ML injection, Inject 1 mL (1,000 mcg total) into the muscle every 30 (thirty) days., Disp: 1 mL, Rfl: 11   dexamethasone (DECADRON) 2 MG tablet, Take 5 tablets (10 mg total) by mouth once a week. Take with food., Disp: 20 tablet, Rfl: 2   DULoxetine (CYMBALTA) 30 MG capsule, Take 30 mg by mouth daily., Disp: , Rfl:    lenalidomide (REVLIMID) 10 MG capsule, Take 1 capsule (10 mg total) by mouth daily. [employer] Auth # [id number]     Date Obtained 01/01/2022, Disp: 21 capsule, Rfl: 0   olmesartan (BENICAR) 20 MG tablet, Take 1 tablet (20 mg total) by mouth daily. For blood pressure., Disp: 90 tablet, Rfl: 3   potassium chloride SA  (KLOR-CON M) 20 MEQ tablet, Take 1 tablet (20 mEq total) by mouth daily., Disp: 14 tablet, Rfl: 0   pregabalin (LYRICA) 150 MG capsule, Take 1 capsule (150 mg total) by mouth 3 (three) times daily as needed (for pain)., Disp: 270 capsule, Rfl: 0   prochlorperazine (COMPAZINE) 10 MG tablet, Take 1 tablet (10 mg total) by mouth every 6 (six) hours as needed for nausea or vomiting., Disp: 60 tablet, Rfl: 0   sertraline (ZOLOFT) 25 MG tablet, Take 1 tablet (25 mg total) by mouth daily., Disp: 30 tablet, Rfl: 11   SUMAtriptan (IMITREX) 50 MG tablet, Take 1 tablet by mouth at migraine onset. May repeat in 2 hours if headache persists or recurs., Disp: 10 tablet, Rfl: 0   Syringe/Needle, Disp, (SYRINGE 3CC/25GX1") 25G X 1" 3 ML MISC, 1 Syringe by Does not apply route every 30 (thirty) days., Disp: 12 each, Rfl: 0   topiramate (TOPAMAX) 50 MG tablet, Take 1 tablet (50 mg total) by mouth at bedtime. For headache prevention., Disp: 90 tablet, Rfl: 2  Physical exam:  Vitals:   04/02/22 1038  BP: (!) 139/93  Pulse: 68  Temp: (!) 96.6 F (35.9 C)  TempSrc: Tympanic  Weight: 145 lb (65.8 kg)  Height: _0  (1.676 m)   Physical Exam Cardiovascular:     Rate and Rhythm: Normal rate and regular rhythm.     Heart sounds: Normal heart sounds.  Pulmonary:     Effort: Pulmonary effort is normal.     Breath sounds: Normal breath sounds.  Abdominal:     General: Bowel sounds are normal.     Palpations: Abdomen is soft.  Skin:    General: Skin is warm and dry.  Neurological:     Mental Status: He is alert and oriented to person, place, and time.         Latest Ref Rng & Units 04/02/2022   10:19 AM  CMP  Glucose 70 - 99 mg/dL 80   BUN 6 - 20 mg/dL 17   Creatinine 0.61 - 1.24 mg/dL 1.36   Sodium 135 - 145 mmol/L  140   Potassium 3.5 - 5.1 mmol/L 3.5   Chloride 98 - 111 mmol/L 108   CO2 22 - 32 mmol/L 25   Calcium 8.9 - 10.3 mg/dL 8.6   Total Protein 6.5 - 8.1 g/dL 6.7   Total Bilirubin 0.3 -  1.2 mg/dL 0.3   Alkaline Phos 38 - 126 U/L 118   AST 15 - 41 U/L 27   ALT 0 - 44 U/L 19       Latest Ref Rng & Units 04/02/2022   10:19 AM  CBC  WBC 4.0 - 10.5 K/uL 3.6   Hemoglobin 13.0 - 17.0 g/dL 13.9   Hematocrit 39.0 - 52.0 % 41.1   Platelets 150 - 400 K/uL 255     No images are attached to the encounter.  No results found.   Assessment and plan- Patient is a 51 y.o. male history ofof kappa light chain multiple myeloma s/p CyBorD chemotherapy followed by autologous stem cell transplantation previously on Revlimid maintenance now concerning for relapse.  He is here for routine follow-up   PET CT scan in May 2023 did not show any active myeloma.  He had a repeat bone marrow biopsy at [facility] which also did not show any recurrent disease.  However serum free kappa light chain continues to trend up from 19.4 in October 2022 presently to 83.1.  Ratio has increased from 1.7-5.69.  Labs from today are currently pending.  This is concerning that his disease is improving/progressing although there was no evidence for the same on his bone marrow biopsy that he had in August 2023.  I will get in touch with Dr. Tracey Harries after his serum free light chain ratio is back.  He has an appointment with them in February 2023.  I will see him back in 3 months with labs.  For now patient will continue Revlimid 15 mg 3 weeks on 1 week off along with low-dose aspirin and weekly Decadron   Visit Diagnosis 1. Multiple myeloma in remission ([place])   2. High risk medication use      Dr. Randa Evens, MD, MPH [facility] at [facility] [phone] 04/02/2022 2:36 PM

## 2022-04-03 LAB — KAPPA/LAMBDA LIGHT CHAINS
Kappa free light chain: 205.1 mg/L — ABNORMAL HIGH (ref 3.3–19.4)
Kappa, lambda light chain ratio: 11.65 — ABNORMAL HIGH (ref 0.26–1.65)
Lambda free light chains: 17.6 mg/L (ref 5.7–26.3)

## 2022-04-09 LAB — MULTIPLE MYELOMA PANEL, SERUM
Albumin SerPl Elph-Mcnc: 3.2 g/dL (ref 2.9–4.4)
Albumin/Glob SerPl: 1.3 (ref 0.7–1.7)
Alpha 1: 0.2 g/dL (ref 0.0–0.4)
Alpha2 Glob SerPl Elph-Mcnc: 0.8 g/dL (ref 0.4–1.0)
B-Globulin SerPl Elph-Mcnc: 0.9 g/dL (ref 0.7–1.3)
Gamma Glob SerPl Elph-Mcnc: 0.7 g/dL (ref 0.4–1.8)
Globulin, Total: 2.6 g/dL (ref 2.2–3.9)
IgA: 194 mg/dL (ref 90–386)
IgG (Immunoglobin G), Serum: 887 mg/dL (ref 603–1613)
IgM (Immunoglobulin M), Srm: 63 mg/dL (ref 20–172)
Total Protein ELP: 5.8 g/dL — ABNORMAL LOW (ref 6.0–8.5)

## 2022-04-17 ENCOUNTER — Other Ambulatory Visit: Payer: Self-pay

## 2022-04-19 ENCOUNTER — Other Ambulatory Visit: Payer: Self-pay

## 2022-04-23 ENCOUNTER — Other Ambulatory Visit: Payer: Self-pay

## 2022-04-26 ENCOUNTER — Other Ambulatory Visit: Payer: Self-pay

## 2022-04-30 ENCOUNTER — Other Ambulatory Visit: Payer: Self-pay | Admitting: *Deleted

## 2022-04-30 MED ORDER — LENALIDOMIDE 10 MG PO CAPS: 10.0000 mg | ORAL_CAPSULE | Freq: Every day | ORAL | 0 refills | Status: DC

## 2022-05-01 ENCOUNTER — Other Ambulatory Visit: Payer: Self-pay | Admitting: *Deleted

## 2022-05-01 DIAGNOSIS — Z79899 Other long term (current) drug therapy: Secondary | ICD-10-CM

## 2022-05-01 DIAGNOSIS — M545 Low back pain, unspecified: Secondary | ICD-10-CM | POA: Diagnosis not present

## 2022-05-01 DIAGNOSIS — C9000 Multiple myeloma not having achieved remission: Secondary | ICD-10-CM | POA: Diagnosis not present

## 2022-05-01 DIAGNOSIS — G8929 Other chronic pain: Secondary | ICD-10-CM | POA: Diagnosis not present

## 2022-05-01 DIAGNOSIS — R110 Nausea: Secondary | ICD-10-CM | POA: Diagnosis not present

## 2022-05-01 DIAGNOSIS — C9001 Multiple myeloma in remission: Secondary | ICD-10-CM

## 2022-05-01 DIAGNOSIS — Z87891 Personal history of nicotine dependence: Secondary | ICD-10-CM | POA: Diagnosis not present

## 2022-05-03 ENCOUNTER — Other Ambulatory Visit: Payer: Self-pay

## 2022-05-06 ENCOUNTER — Other Ambulatory Visit (HOSPITAL_COMMUNITY): Payer: Self-pay

## 2022-05-06 ENCOUNTER — Telehealth: Payer: Self-pay

## 2022-05-06 ENCOUNTER — Telehealth: Payer: Self-pay | Admitting: Pharmacist

## 2022-05-06 ENCOUNTER — Encounter: Payer: Self-pay | Admitting: Oncology

## 2022-05-06 DIAGNOSIS — C9001 Multiple myeloma in remission: Secondary | ICD-10-CM

## 2022-05-06 MED ORDER — LENALIDOMIDE 15 MG PO CAPS: 15.0000 mg | ORAL_CAPSULE | Freq: Every day | ORAL | 0 refills | Status: DC

## 2022-05-06 NOTE — Telephone Encounter (Signed)
[name]  Prior Authorization for Lenalidomide has been approved.    PA# [id number]  Effective dates: 05/06/22 through 05/05/23  Patients co-pay is $0.00.    Berdine Addison, [facility] Patient [place]  [phone] (phone) [phone] (fax) 05/06/2022 2:16 PM

## 2022-05-06 NOTE — Telephone Encounter (Signed)
[name]   Received notification that prior authorization for Lenalidomide is required.   PA submitted on 05/06/22  Key [id number]  Status is pending     Berdine Addison, [place] Patient [place]  [phone] (phone) [phone] (fax) 05/06/2022 1:42 PM

## 2022-05-07 NOTE — Telephone Encounter (Signed)
Called patient to inform him the now that he has insurance he will be filling his lenalidomide using his prescription. PA was obtained for his new insurance and Rx was sent to [facility]. Supportive information sent to [facility].  Patient provided with the phone number to [place].

## 2022-05-10 ENCOUNTER — Ambulatory Visit
Admission: RE | Admit: 2022-05-10 | Discharge: 2022-05-10 | Disposition: A | Discharge status: Home / Self Care | Payer: BC Managed Care – PPO | Source: Ambulatory Visit | Attending: Oncology | Admitting: Oncology

## 2022-05-10 DIAGNOSIS — Z79899 Other long term (current) drug therapy: Secondary | ICD-10-CM | POA: Insufficient documentation

## 2022-05-10 DIAGNOSIS — C9001 Multiple myeloma in remission: Secondary | ICD-10-CM

## 2022-05-10 DIAGNOSIS — C9000 Multiple myeloma not having achieved remission: Secondary | ICD-10-CM | POA: Insufficient documentation

## 2022-05-10 LAB — GLUCOSE, CAPILLARY: Glucose-Capillary: 87 mg/dL (ref 70–99)

## 2022-05-10 MED ORDER — FLUDEOXYGLUCOSE F - 18 (FDG) INJECTION
7.9500 | Freq: Once | INTRAVENOUS | Status: AC | PRN
  Administered 2022-05-10: 7.95 via INTRAVENOUS

## 2022-05-17 ENCOUNTER — Telehealth: Payer: Self-pay

## 2022-05-17 MED ORDER — LENALIDOMIDE 15 MG PO CAPS: 15.0000 mg | ORAL_CAPSULE | Freq: Every day | ORAL | 0 refills | Status: DC

## 2022-05-17 NOTE — Telephone Encounter (Addendum)
Called and left Voicemail for patient to call me back so I could inform them that we had to send Revlimid Rx to [place] per patient's insurance requirement.   2nd attempt - 02.01.24  3rd attempt - 02.05.24  Berdine Addison, [place] Patient [place]  [phone] (phone) [phone] (fax) 05/17/2022 3:28 PM

## 2022-05-17 NOTE — Telephone Encounter (Signed)
Received notification from [facility] that pt is required to fill at [facility]. Prescriptions redirected to [facility].

## 2022-05-28 DIAGNOSIS — Z9481 Bone marrow transplant status: Secondary | ICD-10-CM | POA: Diagnosis not present

## 2022-05-28 DIAGNOSIS — C9002 Multiple myeloma in relapse: Secondary | ICD-10-CM | POA: Diagnosis not present

## 2022-05-28 DIAGNOSIS — C9000 Multiple myeloma not having achieved remission: Secondary | ICD-10-CM | POA: Diagnosis not present

## 2022-05-28 DIAGNOSIS — Z5111 Encounter for antineoplastic chemotherapy: Secondary | ICD-10-CM | POA: Diagnosis not present

## 2022-05-29 ENCOUNTER — Other Ambulatory Visit: Payer: Self-pay | Admitting: *Deleted

## 2022-05-29 ENCOUNTER — Other Ambulatory Visit: Payer: Self-pay | Admitting: Oncology

## 2022-05-29 ENCOUNTER — Telehealth: Payer: Self-pay | Admitting: Pharmacist

## 2022-05-29 ENCOUNTER — Other Ambulatory Visit: Payer: Self-pay

## 2022-05-29 ENCOUNTER — Encounter: Payer: Self-pay | Admitting: Oncology

## 2022-05-29 ENCOUNTER — Other Ambulatory Visit (HOSPITAL_COMMUNITY): Payer: Self-pay

## 2022-05-29 ENCOUNTER — Telehealth: Payer: Self-pay

## 2022-05-29 DIAGNOSIS — C9002 Multiple myeloma in relapse: Secondary | ICD-10-CM

## 2022-05-29 DIAGNOSIS — C9000 Multiple myeloma not having achieved remission: Secondary | ICD-10-CM

## 2022-05-29 MED ORDER — ONDANSETRON HCL 8 MG PO TABS
8.0000 mg | ORAL_TABLET | Freq: Three times a day (TID) | ORAL | 1 refills | Status: DC | PRN
  Filled 2022-05-29: qty 30, 10d supply, fill #0

## 2022-05-29 MED ORDER — MONTELUKAST SODIUM 10 MG PO TABS
ORAL_TABLET | ORAL | 0 refills | Status: DC
  Filled 2022-05-29: qty 4, 4d supply, fill #0

## 2022-05-29 MED ORDER — LIDOCAINE-PRILOCAINE 2.5-2.5 % EX CREA
TOPICAL_CREAM | CUTANEOUS | 3 refills | Status: DC
  Filled 2022-05-29: qty 30, 30d supply, fill #0

## 2022-05-29 MED ORDER — PROCHLORPERAZINE MALEATE 10 MG PO TABS
10.0000 mg | ORAL_TABLET | Freq: Four times a day (QID) | ORAL | 1 refills | Status: DC | PRN
  Filled 2022-05-29: qty 30, 8d supply, fill #0

## 2022-05-29 MED ORDER — DEXAMETHASONE 4 MG PO TABS
ORAL_TABLET | ORAL | 0 refills | Status: DC
  Filled 2022-05-29: qty 4, 1d supply, fill #0

## 2022-05-29 MED ORDER — ACYCLOVIR 400 MG PO TABS
400.0000 mg | ORAL_TABLET | Freq: Two times a day (BID) | ORAL | 11 refills | Status: DC
  Filled 2022-05-29: qty 60, 30d supply, fill #0

## 2022-05-29 MED ORDER — POMALIDOMIDE 3 MG PO CAPS: 3.0000 mg | ORAL_CAPSULE | Freq: Every day | ORAL | 0 refills | Status: DC

## 2022-05-29 MED ORDER — DEXAMETHASONE 4 MG PO TABS
20.0000 mg | ORAL_TABLET | ORAL | 11 refills | Status: DC
  Filled 2022-05-29: qty 20, 28d supply, fill #0

## 2022-05-29 NOTE — Telephone Encounter (Signed)
Oral Oncology Pharmacist Encounter  Received new prescription for Pomalyst (pomalidomide) for the treatment of kappa light chain multiple myeloma in conjunction with daratumumab, planned duration until disease control or unacceptable drug toxicity.  CMP from 04/02/22 assessed, no relevant lab abnormalities. Prescription dose and frequency assessed.   Current medication list in Epic reviewed, no relevant DDIs with pomalidomide identified.  Evaluated chart and no patient barriers to medication adherence identified.   Prescription has been e-scribed to [place] for benefits analysis and approval.  Oral Oncology Clinic will continue to follow for insurance authorization, copayment issues, initial counseling and start date.   Darl Pikes, PharmD, BCPS, BCOP, CPP Hematology/Oncology Clinical Pharmacist Practitioner [place]/DB/AP Oral [facility] [phone]  05/29/2022 9:48 AM

## 2022-05-29 NOTE — Progress Notes (Signed)
START ON PATHWAY REGIMEN - Multiple Myeloma and Other Plasma Cell Dyscrasias ° ° °  Cycles 1 and 2: A cycle is every 28 days: °    Pomalidomide  °    Dexamethasone  °    Daratumumab and hyaluronidase-fihj  °  Cycles 3 through 6: A cycle is every 28 days: °    Pomalidomide  °    Dexamethasone  °    Dexamethasone  °    Daratumumab and hyaluronidase-fihj  °  Cycles 7 and beyond: A cycle is every 28 days: °    Pomalidomide  °    Dexamethasone  °    Dexamethasone  °    Daratumumab and hyaluronidase-fihj  ° °**Always confirm dose/schedule in your pharmacy ordering system** ° °Patient Characteristics: °Multiple Myeloma, Relapsed / Refractory, Second through Fourth Lines of Therapy, Fit or Candidate for Triplet Therapy, Lenalidomide-Refractory or Lenalidomide-based Regimen Not Preferred, Candidate for Anti-CD38 Antibody °Disease Classification: Multiple Myeloma °R-ISS Staging: Unknown °Therapeutic Status: Relapsed °Line of Therapy: Second Line °Anti-CD38 Antibody Candidacy: Candidate for Anti-CD38 Antibody °Lenalidomide-based Regimen Preference/Candidacy: Lenalidomide-Refractory °Intent of Therapy: °Non-Curative / Palliative Intent, Discussed with Patient °

## 2022-05-29 NOTE — Telephone Encounter (Signed)
[name]   Received notification that prior authorization for Pomalyst is required.   PA submitted on 05/29/22  Key [id number]  Status is pending     Berdine Addison, [place] Patient [place]  [phone] (phone) [phone] (fax) 05/29/2022 9:07 AM

## 2022-05-29 NOTE — Telephone Encounter (Signed)
[name]  Prior Authorization for Pomalyst has been approved.    PA# [id number]  Effective dates: 05/29/22 through 05/28/23  Patient must fill through [name] per insurance.    Berdine Addison, [place] [facility] Patient [place]  [phone] (phone) [phone] (fax) 05/29/2022 9:14 AM

## 2022-05-30 ENCOUNTER — Other Ambulatory Visit: Payer: Self-pay

## 2022-05-30 ENCOUNTER — Encounter: Payer: Self-pay | Admitting: Oncology

## 2022-05-30 ENCOUNTER — Telehealth: Payer: Self-pay

## 2022-05-30 NOTE — Telephone Encounter (Signed)
Mr. Ibarra returned my previous phone call (LVM) from this morning. Informed him that Dr. Janese Banks had sent in prescriptions for montelukast, dexamethasone and acyclovir for him. Instructed the patient to take the first dose of montelukast on Monday, 06/03/22, and Tuesday, 06/04/22. I also instructed him to take dexamethasone '20mg'$  on the day after his appointment.   Patient was advised to NOT take any doses of Pomalyst until after his appointment with Dr. Janese Banks on 06/04/22, and that we will talk to him more about the Pomalyst during the visit.   Mr. Al verbalized understanding.   Laray Anger, PharmD PGY-2 Pharmacy Resident Hematology/Oncology [phone]  05/30/2022 1:58 PM

## 2022-06-03 ENCOUNTER — Inpatient Hospital Stay: Payer: BC Managed Care – PPO

## 2022-06-03 ENCOUNTER — Inpatient Hospital Stay: Payer: BC Managed Care – PPO | Admitting: Oncology

## 2022-06-03 NOTE — Telephone Encounter (Signed)
Per [facility] online portal tracking, Pomalyst was delivered on 06/01/22.

## 2022-06-04 ENCOUNTER — Encounter: Payer: Self-pay | Admitting: Oncology

## 2022-06-04 ENCOUNTER — Inpatient Hospital Stay: Payer: BC Managed Care – PPO | Attending: Internal Medicine

## 2022-06-04 ENCOUNTER — Other Ambulatory Visit: Payer: Self-pay | Admitting: *Deleted

## 2022-06-04 ENCOUNTER — Inpatient Hospital Stay: Payer: BC Managed Care – PPO | Admitting: Pharmacist

## 2022-06-04 ENCOUNTER — Other Ambulatory Visit: Payer: Self-pay

## 2022-06-04 ENCOUNTER — Inpatient Hospital Stay: Payer: BC Managed Care – PPO

## 2022-06-04 ENCOUNTER — Inpatient Hospital Stay (HOSPITAL_BASED_OUTPATIENT_CLINIC_OR_DEPARTMENT_OTHER): Payer: BC Managed Care – PPO | Admitting: Oncology

## 2022-06-04 VITALS — BP 129/87 | HR 76 | Resp 20

## 2022-06-04 VITALS — BP 143/106 | HR 77 | Temp 98.5°F | Resp 18 | Ht 67.0 in | Wt 149.2 lb

## 2022-06-04 DIAGNOSIS — C9002 Multiple myeloma in relapse: Secondary | ICD-10-CM

## 2022-06-04 DIAGNOSIS — Z5112 Encounter for antineoplastic immunotherapy: Secondary | ICD-10-CM | POA: Insufficient documentation

## 2022-06-04 DIAGNOSIS — Z5111 Encounter for antineoplastic chemotherapy: Secondary | ICD-10-CM | POA: Diagnosis not present

## 2022-06-04 DIAGNOSIS — Z79899 Other long term (current) drug therapy: Secondary | ICD-10-CM

## 2022-06-04 LAB — CBC WITH DIFFERENTIAL/PLATELET
Abs Immature Granulocytes: 0.01 10*3/uL (ref 0.00–0.07)
Basophils Absolute: 0 10*3/uL (ref 0.0–0.1)
Basophils Relative: 0 %
Eosinophils Absolute: 0.2 10*3/uL (ref 0.0–0.5)
Eosinophils Relative: 4 %
HCT: 43.3 % (ref 39.0–52.0)
Hemoglobin: 14.9 g/dL (ref 13.0–17.0)
Immature Granulocytes: 0 %
Lymphocytes Relative: 20 %
Lymphs Abs: 0.9 10*3/uL (ref 0.7–4.0)
MCH: 32 pg (ref 26.0–34.0)
MCHC: 34.4 g/dL (ref 30.0–36.0)
MCV: 93.1 fL (ref 80.0–100.0)
Monocytes Absolute: 0.4 10*3/uL (ref 0.1–1.0)
Monocytes Relative: 8 %
Neutro Abs: 3.2 10*3/uL (ref 1.7–7.7)
Neutrophils Relative %: 68 %
Platelets: 186 10*3/uL (ref 150–400)
RBC: 4.65 MIL/uL (ref 4.22–5.81)
RDW: 13.6 % (ref 11.5–15.5)
WBC: 4.7 10*3/uL (ref 4.0–10.5)
nRBC: 0 % (ref 0.0–0.2)

## 2022-06-04 LAB — TYPE AND SCREEN
ABO/RH(D): O NEG
Antibody Screen: NEGATIVE

## 2022-06-04 MED ORDER — DARATUMUMAB-HYALURONIDASE-FIHJ 1800-30000 MG-UT/15ML ~~LOC~~ SOLN
1800.0000 mg | Freq: Once | SUBCUTANEOUS | Status: AC
  Administered 2022-06-04: 1800 mg via SUBCUTANEOUS
  Filled 2022-06-04: qty 15

## 2022-06-04 MED ORDER — DEXAMETHASONE 4 MG PO TABS
20.0000 mg | ORAL_TABLET | Freq: Once | ORAL | Status: AC
  Administered 2022-06-04: 20 mg via ORAL
  Filled 2022-06-04: qty 5

## 2022-06-04 MED ORDER — ACETAMINOPHEN 325 MG PO TABS
650.0000 mg | ORAL_TABLET | Freq: Once | ORAL | Status: AC
  Administered 2022-06-04: 650 mg via ORAL
  Filled 2022-06-04: qty 2

## 2022-06-04 MED ORDER — DIPHENHYDRAMINE HCL 25 MG PO CAPS
50.0000 mg | ORAL_CAPSULE | Freq: Once | ORAL | Status: AC
  Administered 2022-06-04: 50 mg via ORAL
  Filled 2022-06-04: qty 2

## 2022-06-04 MED ORDER — DEXAMETHASONE 4 MG PO TABS
20.0000 mg | ORAL_TABLET | ORAL | 1 refills | Status: DC
  Filled 2022-06-04 – 2022-06-28 (×3): qty 40, 28d supply, fill #0

## 2022-06-04 NOTE — Progress Notes (Signed)
[facility]  Telephone:([phone] [phone]  Patient Care Team: Pleas Koch, NP as PCP - General (Internal Medicine) Vella Redhead, MD as PCP - Hematology/Oncology Sindy Guadeloupe, MD as Consulting Physician (Hematology and Oncology)   Name of the patient: James House  [id number]  1970/07/10   Date of visit: 06/04/22  HPI: Patient is a 52 y.o. male with relapsed Multiple Myeloma. Today 06/04/22 patient is starting treatment with daratumumab, pomalidomide, and dexamethasone.   Reason for Consult: Pomalidomide oral chemotherapy education.   PAST MEDICAL HISTORY: Past Medical History:  Diagnosis Date   CKD (chronic kidney disease) stage 3, GFR 30-59 ml/min (HCC)    Hypertension    Multiple myeloma ([place]) 08/11/2017   Neuropathy    Pneumonia    Severe sepsis ([place] [facility]) 08/11/2017   Shingles 08/03/2019    HEMATOLOGY/ONCOLOGY HISTORY:  Oncology History  Multiple myeloma ([place])  08/11/2017 Initial Diagnosis   Multiple myeloma ([place])   10/22/2021 Cancer Staging   Staging form: Plasma Cell Myeloma and Plasma Cell Disorders, AJCC 8th Edition - Clinical stage from 10/22/2021: High-risk cytogenetics: Unknown, LDH: Normal - Signed by Sindy Guadeloupe, MD on 10/22/2021 Stage prefix: Recurrence Cytogenetics: Unknown   06/04/2022 -  Chemotherapy   Patient is on Treatment Plan : MYELOMA RELAPSED REFRACTORY Daratumumab SQ + Pomalidomide + Dexamethasone (DaraPd) q28d       ALLERGIES:  has No Known Allergies.  MEDICATIONS:  Current Outpatient Medications  Medication Sig Dispense Refill   [START ON 06/06/2022] dexamethasone (DECADRON) 4 MG tablet Take 5 tablets (20 mg total) by mouth 2 (two) times a week. Take first dose 1 hour prior to reporting to clinic for daratumumab. Take 2nd dose the day after daratumumab. 40 tablet 1   acyclovir (ZOVIRAX) 400 MG tablet Take 1 tablet (400 mg total) by mouth 2 (two) times daily. 60 tablet 11   amLODipine  (NORVASC) 10 MG tablet Take 1 tablet (10 mg total) by mouth daily. For blood pressure. 90 tablet 0   aspirin EC 81 MG tablet Take 81 mg by mouth daily.     baclofen (LIORESAL) 10 MG tablet Take 1 tablet (10 mg total) by mouth 4 (four) times daily as needed for muscle spasms. 360 tablet 0   cyanocobalamin (VITAMIN B12) 1000 MCG/ML injection Inject 1 mL (1,000 mcg total) into the muscle every 30 (thirty) days. 1 mL 11   DULoxetine (CYMBALTA) 30 MG capsule Take 30 mg by mouth daily.     lidocaine-prilocaine (EMLA) cream Apply to affected area once 30 g 3   montelukast (SINGULAIR) 10 MG tablet Take 1 tablet in the evening 1 day before your treatment. Day of treatment take 1 tablet in the morning for the first 2 treatments of Daratumumab 4 tablet 0   olmesartan (BENICAR) 20 MG tablet Take 1 tablet (20 mg total) by mouth daily. For blood pressure. 90 tablet 3   ondansetron (ZOFRAN) 8 MG tablet Take 1 tablet (8 mg total) by mouth every 8 (eight) hours as needed for nausea or vomiting. 30 tablet 1   pomalidomide (POMALYST) 3 MG capsule Take 1 capsule (3 mg total) by mouth daily. Take for 21 days, then hold for 7 days. Repeat every 28 days. 21 capsule 0   potassium chloride SA (KLOR-CON M) 20 MEQ tablet Take 1 tablet (20 mEq total) by mouth daily. 14 tablet 0   pregabalin (LYRICA) 150 MG capsule Take 1 capsule (150 mg total) by mouth 3 (three) times  daily as needed (for pain). 270 capsule 0   prochlorperazine (COMPAZINE) 10 MG tablet Take 1 tablet (10 mg total) by mouth every 6 (six) hours as needed for nausea or vomiting. 60 tablet 0   prochlorperazine (COMPAZINE) 10 MG tablet Take 1 tablet (10 mg total) by mouth every 6 (six) hours as needed for nausea or vomiting. 30 tablet 1   sertraline (ZOLOFT) 25 MG tablet Take 1 tablet (25 mg total) by mouth daily. 30 tablet 11   SUMAtriptan (IMITREX) 50 MG tablet Take 1 tablet by mouth at migraine onset. May repeat in 2 hours if headache persists or recurs. 10 tablet  0   Syringe/Needle, Disp, (SYRINGE 3CC/25GX1") 25G X 1" 3 ML MISC 1 Syringe by Does not apply route every 30 (thirty) days. 12 each 0   topiramate (TOPAMAX) 50 MG tablet Take 1 tablet (50 mg total) by mouth at bedtime. For headache prevention. 90 tablet 2   No current facility-administered medications for this visit.   Facility-Administered Medications Ordered in Other Visits  Medication Dose Route Frequency Provider Last Rate Last Admin   daratumumab-hyaluronidase-fihj (DARZALEX FASPRO) 1800-30000 MG-UT/15ML chemo SQ injection 1,800 mg  1,800 mg Subcutaneous Once Sindy Guadeloupe, MD        VITAL SIGNS: There were no vitals taken for this visit. There were no vitals filed for this visit.  Estimated body mass index is 23.38 kg/m as calculated from the following:   Height as of an earlier encounter on 06/04/22: 5' 7"$  (1.702 m).   Weight as of an earlier encounter on 06/04/22: 67.7 kg (149 lb 4 oz).  LABS: CBC:    Component Value Date/Time   WBC 4.7 06/04/2022 0803   HGB 14.9 06/04/2022 0803   HCT 43.3 06/04/2022 0803   PLT 186 06/04/2022 0803   MCV 93.1 06/04/2022 0803   NEUTROABS 3.2 06/04/2022 0803   LYMPHSABS 0.9 06/04/2022 0803   MONOABS 0.4 06/04/2022 0803   EOSABS 0.2 06/04/2022 0803   BASOSABS 0.0 06/04/2022 0803   Comprehensive Metabolic Panel:    Component Value Date/Time   NA 140 04/02/2022 1019   NA 140 11/26/2017 1307   K 3.5 04/02/2022 1019   CL 108 04/02/2022 1019   CO2 25 04/02/2022 1019   BUN 17 04/02/2022 1019   BUN 11 11/26/2017 1307   CREATININE 1.36 (H) 04/02/2022 1019   GLUCOSE 80 04/02/2022 1019   CALCIUM 8.6 (L) 04/02/2022 1019   AST 27 04/02/2022 1019   ALT 19 04/02/2022 1019   ALKPHOS 118 04/02/2022 1019   BILITOT 0.3 04/02/2022 1019   BILITOT 0.4 11/26/2017 1307   PROT 6.7 04/02/2022 1019   PROT 7.0 11/26/2017 1307   ALBUMIN 3.5 04/02/2022 1019   ALBUMIN 4.3 11/26/2017 1307     Present during today's visit: patient only, seen in  infusion  Start plan: Patient to pomalidomide today 06/04/22   Patient Education I spoke with patient for overview of new oral chemotherapy medication: pomalidomide   Administration: Counseled patient on administration, dosing, side effects, monitoring, drug-food interactions, safe handling, storage, and disposal. Patient will take: Pomalidomide: Take 1 capsule (3 mg total) by mouth daily. Take for 21 days, then hold for 7 days. Repeat every 28 days.  Aspirin: Take 81 mg by mouth daily  Acyclovir: Take 1 tablet (400 mg total) by mouth 2 (two) times daily.  Dexamethasone: Take 5 tablets (20 mg total) by mouth 2 (two) times a week. Take first dose 1 hour prior to reporting  to clinic for daratumumab. Take 2nd dose the day after daratumumab.  Daratumumab premeds to be taking at home starting next week: Dexamethasone, acetaminophen (681m), and diphenhydramine (568m, montelukast (only for one more week)  Side Effects: Pomalidomide Side effects include but not limited to: diarrhea or constipation, decreased wbc/hgb/plt, nausea, fatigue.    Drug-drug Interactions (DDI): No current DDIs with pomalidomide  Adherence: After discussion with patient no patient barriers to medication adherence identified.  Reviewed with patient importance of keeping a medication schedule and plan for any missed doses.  Mr. HeCheuvrontoiced understanding and appreciation. All questions answered. Medication handout provided.  Provided [facility]. Patient knows to call the office with questions or concerns. Oral [facility]otherapy [facility] will continue to follow.  Patient expressed understanding and was in agreement with this plan. He also understands that He can call clinic at any time with any questions, concerns, or complaints.   Medication Access Issues: No issues, patient fills his pomalidomide at [facility]  Follow-up plan: RTC next week  Thank you for  allowing me to participate in the care of this patient.   Time Total: 25 mins  Visit consisted of counseling and education on dealing with issues of symptom management in the setting of serious and potentially life-threatening illness.Greater than 50%  of this time was spent counseling and coordinating care related to the above assessment and plan.  Signed by: AlDarl PikesPharmD, BCPS, BCSalley SlaughterCPP Hematology/Oncology Clinical Pharmacist Practitioner /DB/AP [id number]/13/2024 9:39 AM

## 2022-06-04 NOTE — Progress Notes (Signed)
Pt monitored for 2 hours post daratumumab faspro injection.  Pt tolerated injection well.  Site benign, vitals stable.  Pt left infusion suite stable and ambulatory.

## 2022-06-04 NOTE — Patient Instructions (Signed)
[facility]  Discharge Instructions: Thank you for choosing [facility] to provide your oncology and hematology care.  If you have a lab appointment with the [facility], please go directly to the [facility] and check in at the registration area.  Wear comfortable clothing and clothing appropriate for easy access to any Portacath or PICC line.   We strive to give you quality time with your provider. You may need to reschedule your appointment if you arrive late (15 or more minutes).  Arriving late affects you and other patients whose appointments are after yours.  Also, if you miss three or more appointments without notifying the office, you may be dismissed from the clinic at the provider's discretion.      For prescription refill requests, have your pharmacy contact our office and allow 72 hours for refills to be completed.    Today you received the following chemotherapy and/or immunotherapy agents darzalex faspro      To help prevent nausea and vomiting after your treatment, we encourage you to take your nausea medication as directed.  BELOW ARE SYMPTOMS THAT SHOULD BE REPORTED IMMEDIATELY: *FEVER GREATER THAN 100.4 F (38 C) OR HIGHER *CHILLS OR SWEATING *NAUSEA AND VOMITING THAT IS NOT CONTROLLED WITH YOUR NAUSEA MEDICATION *UNUSUAL SHORTNESS OF BREATH *UNUSUAL BRUISING OR BLEEDING *URINARY PROBLEMS (pain or burning when urinating, or frequent urination) *BOWEL PROBLEMS (unusual diarrhea, constipation, pain near the anus) TENDERNESS IN MOUTH AND THROAT WITH OR WITHOUT PRESENCE OF ULCERS (sore throat, sores in mouth, or a toothache) UNUSUAL RASH, SWELLING OR PAIN  UNUSUAL VAGINAL DISCHARGE OR ITCHING   Items with * indicate a potential emergency and should be followed up as soon as possible or go to the Emergency Department if any problems should occur.  Please show the CHEMOTHERAPY ALERT CARD or IMMUNOTHERAPY ALERT CARD at  check-in to the Emergency Department and triage nurse.  Should you have questions after your visit or need to cancel or reschedule your appointment, please contact [facility]  [phone] and follow the prompts.  Office hours are 8:00 a.m. to 4:30 p.m. Monday - Friday. Please note that voicemails left after 4:00 p.m. may not be returned until the following business day.  We are closed weekends and major holidays. You have access to a nurse at all times for urgent questions. Please call the main number to the clinic [phone] and follow the prompts.  For any non-urgent questions, you may also contact your provider using MyChart. We now offer e-Visits for anyone 43 and older to request care online for non-urgent symptoms. For details visit [email or web address].   Also download the MyChart app! Go to the app store, search "MyChart", open the app, select [facility], and log in with your MyChart username and password.  Daratumumab; Hyaluronidase Injection What is this medication? DARATUMUMAB; HYALURONIDASE (dar a toom ue mab; hye al ur ON i dase) treats multiple myeloma, a type of bone marrow cancer. Daratumumab works by blocking a protein that causes cancer cells to grow and multiply. This helps to slow or stop the spread of cancer cells. Hyaluronidase works by increasing the absorption of other medications in the body to help them work better. This medication may also be used treat amyloidosis, a condition that causes the buildup of a protein (amyloid) in your body. It works by reducing the buildup of this protein, which decreases symptoms. It is a combination medication that contains a monoclonal  antibody. This medicine may be used for other purposes; ask your health care provider or pharmacist if you have questions. COMMON BRAND NAME(S): DARZALEX FASPRO What should I tell my care team before I take this medication? They need to know if you have any of these  conditions: Heart disease Infection, such as chickenpox, cold sores, herpes, hepatitis B Lung or breathing disease An unusual or allergic reaction to daratumumab, hyaluronidase, other medications, foods, dyes, or preservatives Pregnant or trying to get pregnant Breast-feeding How should I use this medication? This medication is injected under the skin. It is given by your care team in a hospital or clinic setting. Talk to your care team about the use of this medication in children. Special care may be needed. Overdosage: If you think you have taken too much of this medicine contact a poison control center or emergency room at once. NOTE: This medicine is only for you. Do not share this medicine with others. What if I miss a dose? Keep appointments for follow-up doses. It is important not to miss your dose. Call your care team if you are unable to keep an appointment. What may interact with this medication? Interactions have not been studied. This list may not describe all possible interactions. Give your health care provider a list of all the medicines, herbs, non-prescription drugs, or dietary supplements you use. Also tell them if you smoke, drink alcohol, or use illegal drugs. Some items may interact with your medicine. What should I watch for while using this medication? Your condition will be monitored carefully while you are receiving this medication. This medication can cause serious allergic reactions. To reduce your risk, your care team may give you other medication to take before receiving this one. Be sure to follow the directions from your care team. This medication can affect the results of blood tests to match your blood type. These changes can last for up to 6 months after the final dose. Your care team will do blood tests to match your blood type before you start treatment. Tell all of your care team that you are being treated with this medication before receiving a blood  transfusion. This medication can affect the results of some tests used to determine treatment response; extra tests may be needed to evaluate response. Talk to your care team if you wish to become pregnant or think you are pregnant. This medication can cause serious birth defects if taken during pregnancy and for 3 months after the last dose. A reliable form of contraception is recommended while taking this medication and for 3 months after the last dose. Talk to your care team about effective forms of contraception. Do not breast-feed while taking this medication. What side effects may I notice from receiving this medication? Side effects that you should report to your care team as soon as possible: Allergic reactions--skin rash, itching, hives, swelling of the face, lips, tongue, or throat Heart rhythm changes--fast or irregular heartbeat, dizziness, feeling faint or lightheaded, chest pain, trouble breathing Infection--fever, chills, cough, sore throat, wounds that don't heal, pain or trouble when passing urine, general feeling of discomfort or being unwell Infusion reactions--chest pain, shortness of breath or trouble breathing, feeling faint or lightheaded Sudden eye pain or change in vision such as blurry vision, seeing halos around lights, vision loss Unusual bruising or bleeding Side effects that usually do not require medical attention (report to your care team if they continue or are bothersome): Constipation Diarrhea Fatigue Nausea Pain, tingling, or  numbness in the hands or feet Swelling of the ankles, hands, or feet This list may not describe all possible side effects. Call your doctor for medical advice about side effects. You may report side effects to FDA at 1-800-FDA-1088. Where should I keep my medication? This medication is given in a hospital or clinic. It will not be stored at home. NOTE: This sheet is a summary. It may not cover all possible information. If you have  questions about this medicine, talk to your doctor, pharmacist, or health care provider.  2023 Elsevier/Gold Standard (2021-08-01 00:00:00)

## 2022-06-04 NOTE — Progress Notes (Signed)
Patient states that a numbing cream was [prescribed and he would like to know what it is for.

## 2022-06-04 NOTE — Progress Notes (Signed)
Hematology/Oncology Consult note [facility]  [phone] [phone]  Patient Care Team: Pleas Koch, NP as PCP - General (Internal Medicine) Vella Redhead, MD as PCP - Hematology/Oncology Sindy Guadeloupe, MD as Consulting Physician (Hematology and Oncology)   Name of the patient: James House  [id number]  1970-09-06   Date of visit: 06/04/22  Diagnosis-kappa light chain multiple myeloma in relapse  Chief complaint/ Reason for visit-on treatment assessment prior to cycle 1 day 1 of Darzalex  Heme/Onc history:  Patient is a 52-b year-old male with a history of kappa light chain myeloma that was treated by [name].  History is as follows:   1.  Patient presented with renal insufficiency with a creatinine of 2.4 in April 2018.  Kidney biopsy showed myeloma cast nephropathy.  Kappa light chain was 3004 and lambda 0.22 with a kappa lambda ratio of 13,000 655.  Skeletal survey showed multiple calvarial and marrow lesions.  Bone marrow biopsy in May 2018 showed 43% plasma cells.  He received CyBorD for cycle 1 in May 2018 and was subsequently switched to RVD.  He received 4 cycles followed by a repeat bone marrow biopsy in August 2018 which showed no increase in plasma cells.   2.  He underwent autologous stem cell transplantation on 01/30/2017.  He was started on Revlimid maintenance 10 mg daily in January 2019.   3.  Labs from January February and March 2019 done at [facility] showed no M spike, normal kappa lambda ratio of 1.06.  He was last seen by them in April 2019.   4. Following that patient was admitted to [facility] for healthcare associated pneumonia and was recently discharged.  He wished to transfer his care to [place] at this time.  He was on maintenance Revlimid 5 mg which was then reduced to 2.5 mg.  Follows up with pain clinic for chemo-induced peripheral neuropathy with Dr. Dossie Arbour.   5.  Noted to have gradually increasing free kappa  light chainFrom 15 in 20 21-39.5 with a ratio of 3.09.  He was seen by [facility] again and underwent a repeat bone marrow biopsy which did not show any evidence of clonal plasma cells.  PET CT scan also did not show any new active lytic lesions in May 2023.Marland Kitchen  Revlimid dose was increased to 15 mg 3 weeks on 1 week off but kappa light chains show continuing to increase  6.  PET CT scan in December 2023 did not show any evidence of active myeloma.  Patient underwent repeat bone marrow biopsy at [facility] which showed 6% plasma cellsConsistent with persistent plasma cell neoplasm.  74 to 86% of isolated plasma cells show loss of T p53 and monosomy 13.  Patient was seen by [name] at [facility] and was recommended Darzalex Pomalyst dexamethasone regimen  Interval history-patient reports ongoing fatigue.  He has baseline neuropathy.  Denies any new complaints at this time  ECOG PS- 1 Pain scale- 3 Opioid associated constipation- no  Review of systems- Review of Systems  Constitutional:  Positive for malaise/fatigue. Negative for chills, fever and weight loss.  HENT:  Negative for congestion, ear discharge and nosebleeds.   Eyes:  Negative for blurred vision.  Respiratory:  Negative for cough, hemoptysis, sputum production, shortness of breath and wheezing.   Cardiovascular:  Negative for chest pain, palpitations, orthopnea and claudication.  Gastrointestinal:  Negative for abdominal pain, blood in stool, constipation, diarrhea, heartburn, melena, nausea and vomiting.  Genitourinary:  Negative for dysuria, flank pain, frequency, hematuria and urgency.  Musculoskeletal:  Positive for back pain. Negative for joint pain and myalgias.  Skin:  Negative for rash.  Neurological:  Positive for sensory change (Peripheral neuropathy). Negative for dizziness, tingling, focal weakness, seizures, weakness and headaches.  Endo/Heme/Allergies:  Does not bruise/bleed easily.  Psychiatric/Behavioral:  Negative for depression and  suicidal ideas. The patient does not have insomnia.       No Known Allergies   Past Medical History:  Diagnosis Date   CKD (chronic kidney disease) stage 3, GFR 30-59 ml/min (HCC)    Hypertension    Multiple myeloma ([place]) 08/11/2017   Neuropathy    Pneumonia    Severe sepsis ([place]) 08/11/2017   Shingles 08/03/2019     Past Surgical History:  Procedure Laterality Date   ABDOMINAL SURGERY     COLONOSCOPY WITH PROPOFOL N/A 05/25/2021   Procedure: COLONOSCOPY WITH PROPOFOL;  Surgeon: Lin Landsman, MD;  Location: [facility] ENDOSCOPY;  Service: Gastroenterology;  Laterality: N/A;   JOINT REPLACEMENT     right knee   KNEE SURGERY Left     Social History   Socioeconomic History   Marital status: Single    Spouse name: [name]   Number of children: 3   Years of education: Not on file   Highest education level: Not on file  Occupational History   Not on file  Tobacco Use   Smoking status: Former   Smokeless tobacco: Current    Types: Snuff  Vaping Use   Vaping Use: Never used  Substance and Sexual Activity   Alcohol use: No   Drug use: No   Sexual activity: Yes  Other Topics Concern   Not on file  Social History Narrative   Live in private residence with spouse and mother in law   Social Determinants of Health   Financial Resource Strain: [place]  (08/12/2017)   Overall Financial Resource Strain (CARDIA)    Difficulty of Paying Living Expenses: Not hard at all  Food Insecurity: No Food Insecurity (08/12/2017)   Hunger Vital Sign    Worried About Running Out of Food in the Last Year: Never true    [place] in the Last Year: Never true  Transportation Needs: No Transportation Needs (08/12/2017)   PRAPARE - Hydrologist (Medical): No    Lack of Transportation (Non-Medical): No  Physical Activity: Sufficiently Active (08/12/2017)   Exercise Vital Sign    Days of Exercise per Week: 7 days    Minutes of Exercise per Session: 30 min   Stress: No Stress Concern Present (08/12/2017)   [place]    Feeling of Stress : Only a little  Social Connections: Somewhat Isolated (08/12/2017)   Social Connection and Isolation Panel [NHANES]    Frequency of Communication with Friends and Family: Once a week    Frequency of Social Gatherings with Friends and Family: Once a week    Attends Religious Services: More than 4 times per year    Active Member of [employer] or Organizations: No    Attends Archivist Meetings: Never    Marital Status: Married  Human resources officer Violence: Not At Risk (08/12/2017)   Humiliation, Afraid, Rape, and Kick questionnaire    Fear of Current or Ex-Partner: No    Emotionally Abused: No    Physically Abused: No    Sexually Abused: No    Family History  Problem Relation Age of Onset   Cancer Mother 44       Pancreatic   COPD Mother    Diabetes Mother    Hyperlipidemia Mother    Hypertension Mother    Cancer Maternal Uncle        pancreatic   Cancer Maternal Grandmother 45       colon   COPD Maternal Grandmother    Diabetes Maternal Grandmother    Hypertension Maternal Grandmother      Current Outpatient Medications:    acyclovir (ZOVIRAX) 400 MG tablet, Take 1 tablet (400 mg total) by mouth 2 (two) times daily., Disp: 60 tablet, Rfl: 11   amLODipine (NORVASC) 10 MG tablet, Take 1 tablet (10 mg total) by mouth daily. For blood pressure., Disp: 90 tablet, Rfl: 0   aspirin EC 81 MG tablet, Take 81 mg by mouth daily., Disp: , Rfl:    baclofen (LIORESAL) 10 MG tablet, Take 1 tablet (10 mg total) by mouth 4 (four) times daily as needed for muscle spasms., Disp: 360 tablet, Rfl: 0   cyanocobalamin (VITAMIN B12) 1000 MCG/ML injection, Inject 1 mL (1,000 mcg total) into the muscle every 30 (thirty) days., Disp: 1 mL, Rfl: 11   DULoxetine (CYMBALTA) 30 MG capsule, Take 30 mg by mouth daily., Disp: , Rfl:    lidocaine-prilocaine  (EMLA) cream, Apply to affected area once, Disp: 30 g, Rfl: 3   montelukast (SINGULAIR) 10 MG tablet, Take 1 tablet in the evening 1 day before your treatment. Day of treatment take 1 tablet in the morning for the first 2 treatments of Daratumumab, Disp: 4 tablet, Rfl: 0   olmesartan (BENICAR) 20 MG tablet, Take 1 tablet (20 mg total) by mouth daily. For blood pressure., Disp: 90 tablet, Rfl: 3   ondansetron (ZOFRAN) 8 MG tablet, Take 1 tablet (8 mg total) by mouth every 8 (eight) hours as needed for nausea or vomiting., Disp: 30 tablet, Rfl: 1   pomalidomide (POMALYST) 3 MG capsule, Take 1 capsule (3 mg total) by mouth daily. Take for 21 days, then hold for 7 days. Repeat every 28 days., Disp: 21 capsule, Rfl: 0   potassium chloride SA (KLOR-CON M) 20 MEQ tablet, Take 1 tablet (20 mEq total) by mouth daily., Disp: 14 tablet, Rfl: 0   pregabalin (LYRICA) 150 MG capsule, Take 1 capsule (150 mg total) by mouth 3 (three) times daily as needed (for pain)., Disp: 270 capsule, Rfl: 0   prochlorperazine (COMPAZINE) 10 MG tablet, Take 1 tablet (10 mg total) by mouth every 6 (six) hours as needed for nausea or vomiting., Disp: 60 tablet, Rfl: 0   prochlorperazine (COMPAZINE) 10 MG tablet, Take 1 tablet (10 mg total) by mouth every 6 (six) hours as needed for nausea or vomiting., Disp: 30 tablet, Rfl: 1   sertraline (ZOLOFT) 25 MG tablet, Take 1 tablet (25 mg total) by mouth daily., Disp: 30 tablet, Rfl: 11   SUMAtriptan (IMITREX) 50 MG tablet, Take 1 tablet by mouth at migraine onset. May repeat in 2 hours if headache persists or recurs., Disp: 10 tablet, Rfl: 0   Syringe/Needle, Disp, (SYRINGE 3CC/25GX1") 25G X 1" 3 ML MISC, 1 Syringe by Does not apply route every 30 (thirty) days., Disp: 12 each, Rfl: 0   topiramate (TOPAMAX) 50 MG tablet, Take 1 tablet (50 mg total) by mouth at bedtime. For headache prevention., Disp: 90 tablet, Rfl: 2   [START ON 06/06/2022] dexamethasone (DECADRON) 4 MG tablet, Take 5  tablets (20  mg total) by mouth 2 (two) times a week. Take first dose 1 hour prior to reporting to clinic for daratumumab. Take 2nd dose the day after daratumumab., Disp: 40 tablet, Rfl: 1  Physical exam:  Vitals:   06/04/22 0835  BP: (!) 143/106  Pulse: 77  Resp: 18  Temp: 98.5 F (36.9 C)  TempSrc: Oral  SpO2: 100%  Weight: 149 lb 4 oz (67.7 kg)  Height: 5' 7"$  (1.702 m)   Physical Exam Cardiovascular:     Rate and Rhythm: Normal rate and regular rhythm.     Heart sounds: Normal heart sounds.  Pulmonary:     Effort: Pulmonary effort is normal.     Breath sounds: Normal breath sounds.  Skin:    General: Skin is warm and dry.  Neurological:     Mental Status: He is alert and oriented to person, place, and time.         Latest Ref Rng & Units 04/02/2022   10:19 AM  CMP  Glucose 70 - 99 mg/dL 80   BUN 6 - 20 mg/dL 17   Creatinine 0.61 - 1.24 mg/dL 1.36   Sodium 135 - 145 mmol/L 140   Potassium 3.5 - 5.1 mmol/L 3.5   Chloride 98 - 111 mmol/L 108   CO2 22 - 32 mmol/L 25   Calcium 8.9 - 10.3 mg/dL 8.6   Total Protein 6.5 - 8.1 g/dL 6.7   Total Bilirubin 0.3 - 1.2 mg/dL 0.3   Alkaline Phos 38 - 126 U/L 118   AST 15 - 41 U/L 27   ALT 0 - 44 U/L 19       Latest Ref Rng & Units 06/04/2022    8:03 AM  CBC  WBC 4.0 - 10.5 K/uL 4.7   Hemoglobin 13.0 - 17.0 g/dL 14.9   Hematocrit 39.0 - 52.0 % 43.3   Platelets 150 - 400 K/uL 186     No images are attached to the encounter.  NM PET Image Restage (PS) Whole Body  Result Date: 05/11/2022 CLINICAL DATA:  Subsequent treatment strategy for multiple myeloma. EXAM: NUCLEAR MEDICINE PET WHOLE BODY TECHNIQUE: 7.95 mCi F-18 FDG was injected intravenously. Full-ring PET imaging was performed from the head to foot after the radiotracer. CT data was obtained and used for attenuation correction and anatomic localization. Fasting blood glucose: 87 mg/dl COMPARISON:  Are 09/19/2021 FINDINGS: Mediastinal blood pool activity: SUV max 1.55  HEAD/NECK: No hypermetabolic activity in the scalp. No hypermetabolic cervical lymph nodes. Incidental CT findings: None CHEST: No hypermetabolic mediastinal or hilar nodes. No suspicious pulmonary nodules on the CT scan. Incidental CT findings: none ABDOMEN/PELVIS: No abnormal hypermetabolic activity within the liver, pancreas, adrenal glands, or spleen. No hypermetabolic lymph nodes in the abdomen or pelvis. Incidental CT findings: none SKELETON: No focal hypermetabolic activity to suggest skeletal metastasis. Incidental CT findings: No suspicious bone lesions identified. EXTREMITIES: No abnormal hypermetabolic activity in the lower extremities.No suspicious lucent bone lesions. Incidental CT findings: Status post left knee hemiarthroplasty. Again seen is increased uptake surrounding the left knee arthroplasty device compatible with reactive/inflammatory change. IMPRESSION: 1. No findings of metabolically active multiple myeloma. 2. No evidence of soft tissue plasmacytoma. 3. Status post left knee hemiarthroplasty. Electronically Signed   By: Kerby Moors M.D.   On: 05/11/2022 07:48     Assessment and plan- Patient is a 52 y.o. male with history of CAD light chain multiple myeloma currently in relapse here for on treatment assessment prior to cycle  1 day 1 of Darzalex  Counts okay to proceed with cycle 1 day 1 of subcu Darzalex today.  He has been taking Singulair yesterday and today.  He also took Decadron 20 mg yesterday and will take it tomorrow as well.  He will directly proceed for cycle 1 day 8 of Darzalex next week and I will see him back in 2 weeks for cycle 1 day 15.  He has also received his Pomalyst today 3 mg 3 weeks on and 1 week off which she will start taking today.  Discussed risks and benefits of Pomalyst including all but not limited to nausea, vomiting, diarrhea, risk of infections and hospitalizations.  Discussed risks and benefits of Darzalex including all but not limited to possible  risk of infusion reactions and cytopenias.  Treatment will be given with a palliative intent.  Patient understands and agrees to proceed as planned.  In a multicenter, open-label phase 3 trial (APOLLO), 304 patients with relapsed/refractory MM were randomly assigned to receive pomalidomide and dexamethasone (Pd) with or without subcutaneous daratumumab, each administered until disease progression or unacceptable toxicity . The addition of daratumumab improved PFS (median PFS 12.4 versus 6.9 months, HR 0.63, 95% CI 0.47-0.85). OS data are immature.    Patient is also aware that he needs to take low-dose aspirin as well as acyclovir prophylaxis.  He will be on 20 mg of weekly Decadron.   Visit Diagnosis 1. Multiple myeloma in relapse ([place])   2. Encounter for antineoplastic chemotherapy   3. High risk medication use      Dr. Randa Evens, MD, MPH [facility] at [facility] [id number] 06/04/2022 12:57 PM

## 2022-06-05 ENCOUNTER — Other Ambulatory Visit: Payer: Self-pay | Admitting: Pharmacist

## 2022-06-05 ENCOUNTER — Other Ambulatory Visit: Payer: Self-pay

## 2022-06-05 ENCOUNTER — Other Ambulatory Visit: Payer: Self-pay | Admitting: *Deleted

## 2022-06-05 DIAGNOSIS — C9002 Multiple myeloma in relapse: Secondary | ICD-10-CM

## 2022-06-05 LAB — PRETREATMENT RBC PHENOTYPE

## 2022-06-05 MED ORDER — OLANZAPINE 10 MG PO TABS
10.0000 mg | ORAL_TABLET | Freq: Every day | ORAL | 1 refills | Status: AC
  Filled 2022-06-05: qty 30, 30d supply, fill #0
  Filled 2022-08-13: qty 30, 30d supply, fill #1

## 2022-06-05 NOTE — Progress Notes (Unsigned)
WORK LETTER

## 2022-06-05 NOTE — Progress Notes (Signed)
Letter for work. Pt is sick

## 2022-06-06 ENCOUNTER — Telehealth: Payer: Self-pay

## 2022-06-06 NOTE — Telephone Encounter (Signed)
James House called the office 06/05/2022 to report 10 episodes of diarrhea, 6 episodes of nausea and abdominal bloating since receiving treatment on 06/04/2022. Advised patient to take loperamide for diarrhea, ensure adequate fluid intake and a prescription for olanzapine was sent in to patient's pharmacy.   I called to check on the patient today. He reports taking loperamide for his diarrhea that has been helpful. He had one incident of diarrhea this morning, but took loperamide and has not had another episode since then. He picked up and took olanzapine yesterday and states that it helped as well.   Recommended that patient continued to take anti-diarrheal and anti-emetics as needed and to maintain good fluid intake.   James House has the number to the office if he has any additional questions or concerns.   Laray Anger, PharmD PGY-2 Pharmacy Resident Hematology/Oncology [phone]  06/06/2022 10:58 AM

## 2022-06-11 ENCOUNTER — Inpatient Hospital Stay: Payer: BC Managed Care – PPO

## 2022-06-11 VITALS — BP 133/81 | HR 73 | Temp 96.4°F | Resp 20

## 2022-06-11 DIAGNOSIS — Z5112 Encounter for antineoplastic immunotherapy: Secondary | ICD-10-CM | POA: Diagnosis not present

## 2022-06-11 DIAGNOSIS — C9002 Multiple myeloma in relapse: Secondary | ICD-10-CM

## 2022-06-11 LAB — CBC WITH DIFFERENTIAL/PLATELET
Abs Immature Granulocytes: 0.01 10*3/uL (ref 0.00–0.07)
Basophils Absolute: 0 10*3/uL (ref 0.0–0.1)
Basophils Relative: 1 %
Eosinophils Absolute: 0.3 10*3/uL (ref 0.0–0.5)
Eosinophils Relative: 7 %
HCT: 42.6 % (ref 39.0–52.0)
Hemoglobin: 14.6 g/dL (ref 13.0–17.0)
Immature Granulocytes: 0 %
Lymphocytes Relative: 15 %
Lymphs Abs: 0.6 10*3/uL — ABNORMAL LOW (ref 0.7–4.0)
MCH: 31.4 pg (ref 26.0–34.0)
MCHC: 34.3 g/dL (ref 30.0–36.0)
MCV: 91.6 fL (ref 80.0–100.0)
Monocytes Absolute: 0.3 10*3/uL (ref 0.1–1.0)
Monocytes Relative: 7 %
Neutro Abs: 3 10*3/uL (ref 1.7–7.7)
Neutrophils Relative %: 70 %
Platelets: 166 10*3/uL (ref 150–400)
RBC: 4.65 MIL/uL (ref 4.22–5.81)
RDW: 13.4 % (ref 11.5–15.5)
WBC: 4.3 10*3/uL (ref 4.0–10.5)
nRBC: 0 % (ref 0.0–0.2)

## 2022-06-11 LAB — CMP (CANCER CENTER ONLY)
ALT: 29 U/L (ref 0–44)
AST: 27 U/L (ref 15–41)
Albumin: 3.6 g/dL (ref 3.5–5.0)
Alkaline Phosphatase: 103 U/L (ref 38–126)
Anion gap: 7 (ref 5–15)
BUN: 13 mg/dL (ref 6–20)
CO2: 23 mmol/L (ref 22–32)
Calcium: 8.6 mg/dL — ABNORMAL LOW (ref 8.9–10.3)
Chloride: 109 mmol/L (ref 98–111)
Creatinine: 1.31 mg/dL — ABNORMAL HIGH (ref 0.61–1.24)
GFR, Estimated: >60 mL/min (ref >60)
Glucose, Bld: 86 mg/dL (ref 70–99)
Potassium: 3.8 mmol/L (ref 3.5–5.1)
Sodium: 139 mmol/L (ref 135–145)
Total Bilirubin: 0.5 mg/dL (ref 0.3–1.2)
Total Protein: 6.5 g/dL (ref 6.5–8.1)

## 2022-06-11 MED ORDER — DARATUMUMAB-HYALURONIDASE-FIHJ 1800-30000 MG-UT/15ML ~~LOC~~ SOLN
1800.0000 mg | Freq: Once | SUBCUTANEOUS | Status: AC
  Administered 2022-06-11: 1800 mg via SUBCUTANEOUS
  Filled 2022-06-11: qty 15

## 2022-06-11 NOTE — Progress Notes (Signed)
1309: RN verified patient has taken montelukast, dexamethasone, diphenhydramine, and acetaminophen today prior to arriving for treatment.

## 2022-06-12 ENCOUNTER — Ambulatory Visit (INDEPENDENT_AMBULATORY_CARE_PROVIDER_SITE_OTHER)
Admission: RE | Admit: 2022-06-12 | Discharge: 2022-06-12 | Disposition: A | Discharge status: Home / Self Care | Payer: BC Managed Care – PPO | Source: Ambulatory Visit | Attending: Primary Care | Admitting: Primary Care

## 2022-06-12 ENCOUNTER — Encounter: Payer: Self-pay | Admitting: Primary Care

## 2022-06-12 ENCOUNTER — Ambulatory Visit (INDEPENDENT_AMBULATORY_CARE_PROVIDER_SITE_OTHER): Payer: BC Managed Care – PPO | Admitting: Primary Care

## 2022-06-12 VITALS — BP 136/84 | HR 77 | Temp 98.4°F | Ht 67.0 in | Wt 147.0 lb

## 2022-06-12 DIAGNOSIS — M25562 Pain in left knee: Secondary | ICD-10-CM | POA: Insufficient documentation

## 2022-06-12 DIAGNOSIS — Z96652 Presence of left artificial knee joint: Secondary | ICD-10-CM | POA: Diagnosis not present

## 2022-06-12 DIAGNOSIS — Z043 Encounter for examination and observation following other accident: Secondary | ICD-10-CM | POA: Diagnosis not present

## 2022-06-12 LAB — KAPPA/LAMBDA LIGHT CHAINS
Kappa free light chain: 267 mg/L — ABNORMAL HIGH (ref 3.3–19.4)
Kappa, lambda light chain ratio: 26.44 — ABNORMAL HIGH (ref 0.26–1.65)
Lambda free light chains: 10.1 mg/L (ref 5.7–26.3)

## 2022-06-12 NOTE — Progress Notes (Signed)
Established Patient Office Visit  Subjective   Patient ID: James House., male    DOB: [birth year]  Age: 52 y.o. MRN: [id number]  Chief Complaint  Patient presents with   Knee Pain    Left knee pain for several weeks. Has had knee replacement on this knee.     Knee Pain     James House. Is a 52 year old male with past medical history of osteoarthritis of right knee, DDD, chronic low back pain, multiple myeloma presents today to discuss knee pain.  Symptoms started in January with left knee pain. He has constant pain. He has to walk a lot for his job. He decribes his pain as dull, constant, throbbing pain. He reports that the pain is located right below the patella; however when walking, he reports that his pain gradually moves up laterally to the hip. Pain is constant with activity and rest. He has had partial knee replacement completed in 2002. He has two falls with the most recent being three weeks ago. His last fall was at the end of January, when he fell on his left knee. He believes his symptoms started post fall and the constant walking at work daily. He has tried tylenol, lyrica, oxycodone, tramadol and heat with temporary relief. He does not have a orthopedic doctor and has not seen one in a long time. He does not go to the pain clinic anymore.  Patient Active Problem List   Diagnosis Date Noted   Acute pain of left knee 06/12/2022   Frequent headaches 06/08/2021   Colon cancer screening    Migraines 05/01/2021   GAD (generalized anxiety disorder) 04/11/2021   Abnormal drug screen (12/04/2020) 03/28/2021   Marijuana use 03/28/2021   Ulnar collateral ligament sprain of left elbow, sequela 10/03/2020   Golfer's elbow (Left) 10/03/2020   Golfer's elbow (Right) 10/03/2020   Chronic use of opiate for therapeutic purpose 08/28/2020   Epicondylitis elbow, medial (Left) 06/13/2020   Epicondylitis elbow, medial (Right) 06/13/2020   Medial epicondylitis of elbows (Bilateral)  06/13/2020   Ulnar collateral ligament sprain of right elbow, sequela 06/13/2020   Failure to attend appointment 06/13/2020   Olecranon bursitis (Bilateral) 06/05/2020   Chronic elbow joint pain (Medial aspect) (Bilateral) [id number]   Uncomplicated opioid dependence ([place]) 03/08/2020   DDD (degenerative disc disease), thoracic 11/08/2019   Non-traumatic compression fracture of T7 thoracic vertebra, sequela 11/08/2019   Pruritus 09/13/2019   Thoracic radiculitis (Right) 08/13/2019   Preventative health care 12/14/2018   DDD (degenerative disc disease), lumbosacral 10/13/2018   Elevated serum creatinine 09/28/2018   Piriformis muscle pain (Left) 06/23/2018   Spasm of piriformis muscle (Left) 06/23/2018   Piriformis syndrome (Left) 06/15/2018   Osteoarthritis of hips (Bilateral) 05/21/2018   Need for prophylactic measure 04/13/2018   Status post autologous bone marrow transplant ([place]) 04/13/2018   Chronic lumbar radiculopathy (by EMG/PNCV) (L5) (Right) 01/26/2018   Chronic low back pain (Bilateral) (L>R) w/o sciatica 01/26/2018   Spasm of back muscles 01/26/2018   Chronic musculoskeletal pain 01/26/2018   Chemotherapy-induced peripheral neuropathy ([place]) 12/24/2017   Neurogenic pain 12/24/2017   Neuropathic pain 12/24/2017   History of Partial knee replacement (Left) 12/24/2017   Chronic knee pain s/p partial replacement (Left) 12/24/2017   Elevated sedimentation rate 12/24/2017   Hypocalcemia 12/24/2017   Osteoarthritis of knee (Right) 12/24/2017   Chronic hip pain (Bilateral) (R>L) 12/24/2017   Chronic sacroiliac joint pain (Bilateral) (R>L) 12/24/2017   Other specified dorsopathies, sacral  and sacrococcygeal region 12/24/2017   Lumbar facet syndrome (Bilateral) (L>R) 12/24/2017   Spondylosis without myelopathy or radiculopathy, lumbar region 12/24/2017   Chronic low back pain (1ry area of Pain) (Bilateral) (L>R) w/ sciatica (Bilateral) 11/26/2017   Chronic lower extremity pain  (2ry area of Pain) (Bilateral) (L>R) 11/26/2017   Chronic knee pain (3ry area of Pain) (Bilateral) (L>R) 11/26/2017   Chronic pain syndrome 11/26/2017   Long term current use of opiate analgesic 11/26/2017   Pharmacologic therapy 11/26/2017   Disorder of skeletal system 11/26/2017   Problems influencing health status 11/26/2017   Vitamin B 12 deficiency 10/31/2017   Syncope 08/23/2017   CKD (chronic kidney disease), stage III ([place]) 08/23/2017   HTN (hypertension) 08/23/2017   Hyponatremia 08/11/2017   Multiple myeloma ([place]) 08/11/2017   Past Medical History:  Diagnosis Date   CKD (chronic kidney disease) stage 3, GFR 30-59 ml/min (HCC)    Hypertension    Multiple myeloma ([place]) 08/11/2017   Neuropathy    Pneumonia    Severe sepsis ([place]) 08/11/2017   Shingles 08/03/2019   Past Surgical History:  Procedure Laterality Date   ABDOMINAL SURGERY     COLONOSCOPY WITH PROPOFOL N/A 05/25/2021   Procedure: COLONOSCOPY WITH PROPOFOL;  Surgeon: Lin Landsman, MD;  Location: [facility] ENDOSCOPY;  Service: Gastroenterology;  Laterality: N/A;   JOINT REPLACEMENT     right knee   KNEE SURGERY Left    Social History   Tobacco Use   Smoking status: Former   Smokeless tobacco: Current    Types: Snuff  Vaping Use   Vaping Use: Never used  Substance Use Topics   Alcohol use: No   Drug use: No   Family History  Problem Relation Age of Onset   Cancer Mother 24       Pancreatic   COPD Mother    Diabetes Mother    Hyperlipidemia Mother    Hypertension Mother    Cancer Maternal Uncle        pancreatic   Cancer Maternal Grandmother 10       colon   COPD Maternal Grandmother    Diabetes Maternal Grandmother    Hypertension Maternal Grandmother    No Known Allergies    Review of Systems  Respiratory:  Negative for shortness of breath.   Cardiovascular:  Negative for chest pain.  Musculoskeletal:  Positive for falls and joint pain.  Neurological:  Negative for dizziness, weakness and  headaches.      Objective:     BP 136/84   Pulse 77   Temp 98.4 F (36.9 C) (Temporal)   Ht 5' 7"$  (1.702 m)   Wt 147 lb (66.7 kg)   SpO2 99%   BMI 23.02 kg/m  BP Readings from Last 3 Encounters:  06/12/22 136/84  06/11/22 133/81  06/04/22 129/87   Wt Readings from Last 3 Encounters:  06/12/22 147 lb (66.7 kg)  06/04/22 149 lb 4 oz (67.7 kg)  04/02/22 145 lb (65.8 kg)      Physical Exam Vitals and nursing note reviewed.  Constitutional:      Appearance: Normal appearance.  Cardiovascular:     Rate and Rhythm: Normal rate and regular rhythm.     Pulses: Normal pulses.     Heart sounds: Normal heart sounds.  Pulmonary:     Effort: Pulmonary effort is normal.     Breath sounds: Normal breath sounds.  Musculoskeletal:        General: Tenderness present. No swelling.  Left knee: No swelling or erythema. Decreased range of motion.     Comments: ROM decreased with flexion of left knee.  Neurological:     Mental Status: He is alert and oriented to person, place, and time.      No results found for any visits on 06/12/22.     The 10-year ASCVD risk score (Arnett DK, et al., 2019) is: 4.5%    Assessment & Plan:   Problem List Items Addressed This Visit       Other   Acute pain of left knee - Primary    Unclear etiology.  Differentials include injury, osteoarthritis, sciatica   Given recent fall and physical presentation today, STAT Left knee x-ray ordered.   Await results  Work note provided from today through Friday, 06/14/22.        Relevant Orders   DG Knee Complete 4 Views Left (Completed)    No follow-ups on file.    Tinnie Gens, BSN-RN, DNP STUDENT

## 2022-06-12 NOTE — Patient Instructions (Signed)
Complete xray(s) prior to leaving today. I will notify you of your results once received.  It was a pleasure to see you today!  

## 2022-06-12 NOTE — Assessment & Plan Note (Addendum)
Unclear etiology.  Differentials include injury, osteoarthritis.   Given recent fall and physical presentation today, STAT Left knee x-ray ordered.   Await results  Work note provided from today through Friday, 06/14/22.  Continue home medications for now.  I evaluated patient, was consulted regarding treatment, and agree with assessment and plan per Tinnie Gens, RN, DNP student.   Allie Bossier, NP-C

## 2022-06-12 NOTE — Progress Notes (Signed)
Subjective:    Patient ID: James Box., male    DOB: 06/16/1970, 52 y.o.   MRN: [id number]  HPI  James Mucha. is a very pleasant 52 y.o. male with a history of chronic back pain, post chemotherapy neuropathy, piriformis syndrome, osteoarthritis of the right knee, history of partial left knee replacement. who presents today to discuss knee pain.  His pain is located slightly distal to the mid left knee which began around the time he fell in January 2024. His pain is constant and throbbing, worse with walking and weight bearing movement. He is very active at work which has exacerbated his knee pain.   He's been taking Tramadol and oxycodone with temporary relief.    Review of Systems  Musculoskeletal:  Positive for arthralgias and joint swelling.  Neurological:  Negative for numbness.         Past Medical History:  Diagnosis Date   CKD (chronic kidney disease) stage 3, GFR 30-59 ml/min (HCC)    Hypertension    Multiple myeloma ([place]) 08/11/2017   Neuropathy    Pneumonia    Severe sepsis ([place]) 08/11/2017   Shingles 08/03/2019    Social History   Socioeconomic History   Marital status: Single    Spouse name: [name]   Number of children: 3   Years of education: Not on file   Highest education level: Not on file  Occupational History   Not on file  Tobacco Use   Smoking status: Former   Smokeless tobacco: Current    Types: Snuff  Vaping Use   Vaping Use: Never used  Substance and Sexual Activity   Alcohol use: No   Drug use: No   Sexual activity: Yes  Other Topics Concern   Not on file  Social History Narrative   Live in private residence with spouse and mother in law   Social Determinants of Health   Financial Resource Strain: [place]  (08/12/2017)   Overall Financial Resource Strain (CARDIA)    Difficulty of Paying Living Expenses: Not hard at all  Food Insecurity: No Food Insecurity (08/12/2017)   Hunger Vital Sign    Worried About Running Out of Food  in the Last Year: Never true    [place] in the Last Year: Never true  Transportation Needs: No Transportation Needs (08/12/2017)   PRAPARE - Hydrologist (Medical): No    Lack of Transportation (Non-Medical): No  Physical Activity: Sufficiently Active (08/12/2017)   Exercise Vital Sign    Days of Exercise per Week: 7 days    Minutes of Exercise per Session: 30 min  Stress: No Stress Concern Present (08/12/2017)   [place]    Feeling of Stress : Only a little  Social Connections: Somewhat Isolated (08/12/2017)   Social Connection and Isolation Panel [NHANES]    Frequency of Communication with Friends and Family: Once a week    Frequency of Social Gatherings with Friends and Family: Once a week    Attends Religious Services: More than 4 times per year    Active Member of [employer] or Organizations: No    Attends Archivist Meetings: Never    Marital Status: Married  Human resources officer Violence: Not At Risk (08/12/2017)   Humiliation, Afraid, Rape, and Kick questionnaire    Fear of Current or Ex-Partner: No    Emotionally Abused: No    Physically Abused: No  Sexually Abused: No    Past Surgical History:  Procedure Laterality Date   ABDOMINAL SURGERY     COLONOSCOPY WITH PROPOFOL N/A 05/25/2021   Procedure: COLONOSCOPY WITH PROPOFOL;  Surgeon: Lin Landsman, MD;  Location: [facility] ENDOSCOPY;  Service: Gastroenterology;  Laterality: N/A;   JOINT REPLACEMENT     right knee   KNEE SURGERY Left     Family History  Problem Relation Age of Onset   Cancer Mother 4       Pancreatic   COPD Mother    Diabetes Mother    Hyperlipidemia Mother    Hypertension Mother    Cancer Maternal Uncle        pancreatic   Cancer Maternal Grandmother 20       colon   COPD Maternal Grandmother    Diabetes Maternal Grandmother    Hypertension Maternal Grandmother     No Known  Allergies  Current Outpatient Medications on File Prior to Visit  Medication Sig Dispense Refill   acyclovir (ZOVIRAX) 400 MG tablet Take 1 tablet (400 mg total) by mouth 2 (two) times daily. 60 tablet 11   amLODipine (NORVASC) 10 MG tablet Take 1 tablet (10 mg total) by mouth daily. For blood pressure. 90 tablet 0   aspirin EC 81 MG tablet Take 81 mg by mouth daily.     baclofen (LIORESAL) 10 MG tablet Take 1 tablet (10 mg total) by mouth 4 (four) times daily as needed for muscle spasms. 360 tablet 0   dexamethasone (DECADRON) 4 MG tablet Take 5 tablets (20 mg total) by mouth 2 (two) times a week. Take first dose 1 hour prior to reporting to clinic for daratumumab. Take 2nd dose the day after daratumumab. 40 tablet 1   DULoxetine (CYMBALTA) 30 MG capsule Take 30 mg by mouth daily.     lidocaine-prilocaine (EMLA) cream Apply to affected area once 30 g 3   OLANZapine (ZYPREXA) 10 MG tablet Take 1 tablet (10 mg total) by mouth at bedtime. To help with nausea prevention. 30 tablet 1   olmesartan (BENICAR) 20 MG tablet Take 1 tablet (20 mg total) by mouth daily. For blood pressure. 90 tablet 3   ondansetron (ZOFRAN) 8 MG tablet Take 1 tablet (8 mg total) by mouth every 8 (eight) hours as needed for nausea or vomiting. 30 tablet 1   pomalidomide (POMALYST) 3 MG capsule Take 1 capsule (3 mg total) by mouth daily. Take for 21 days, then hold for 7 days. Repeat every 28 days. 21 capsule 0   pregabalin (LYRICA) 150 MG capsule Take 1 capsule (150 mg total) by mouth 3 (three) times daily as needed (for pain). 270 capsule 0   prochlorperazine (COMPAZINE) 10 MG tablet Take 1 tablet (10 mg total) by mouth every 6 (six) hours as needed for nausea or vomiting. 30 tablet 1   sertraline (ZOLOFT) 25 MG tablet Take 1 tablet (25 mg total) by mouth daily. 30 tablet 11   SUMAtriptan (IMITREX) 50 MG tablet Take 1 tablet by mouth at migraine onset. May repeat in 2 hours if headache persists or recurs. 10 tablet 0    Syringe/Needle, Disp, (SYRINGE 3CC/25GX1") 25G X 1" 3 ML MISC 1 Syringe by Does not apply route every 30 (thirty) days. 12 each 0   topiramate (TOPAMAX) 50 MG tablet Take 1 tablet (50 mg total) by mouth at bedtime. For headache prevention. 90 tablet 2   cyanocobalamin (VITAMIN B12) 1000 MCG/ML injection Inject 1 mL (1,000 mcg total)  into the muscle every 30 (thirty) days. (Patient not taking: Reported on 06/12/2022) 1 mL 11   montelukast (SINGULAIR) 10 MG tablet Take 1 tablet in the evening 1 day before your treatment. Day of treatment take 1 tablet in the morning for the first 2 treatments of Daratumumab (Patient not taking: Reported on 06/12/2022) 4 tablet 0   potassium chloride SA (KLOR-CON M) 20 MEQ tablet Take 1 tablet (20 mEq total) by mouth daily. (Patient not taking: Reported on 06/12/2022) 14 tablet 0   prochlorperazine (COMPAZINE) 10 MG tablet Take 1 tablet (10 mg total) by mouth every 6 (six) hours as needed for nausea or vomiting. (Patient not taking: Reported on 06/12/2022) 60 tablet 0   No current facility-administered medications on file prior to visit.    BP 136/84   Pulse 77   Temp 98.4 F (36.9 C) (Temporal)   Ht 5' 7"$  (1.702 m)   Wt 147 lb (66.7 kg)   SpO2 99%   BMI 23.02 kg/m  Objective:   Physical Exam Constitutional:      General: He is not in acute distress. Musculoskeletal:     Left knee: Swelling and bony tenderness present. No erythema. Decreased range of motion.       Legs:     Comments: Ambulatory in office today with limp  Skin:    General: Skin is warm and dry.  Neurological:     Mental Status: He is alert.           Assessment & Plan:  Acute pain of left knee Assessment & Plan: Unclear etiology.  Differentials include injury, osteoarthritis.   Given recent fall and physical presentation today, STAT Left knee x-ray ordered.   Await results  Work note provided from today through Friday, 06/14/22.  Continue home medications for now.  I  evaluated patient, was consulted regarding treatment, and agree with assessment and plan per Tinnie Gens, RN, DNP student.   Allie Bossier, NP-C    Orders: -     DG Knee Complete 4 Views Left        Pleas Koch, NP

## 2022-06-13 ENCOUNTER — Telehealth: Payer: Self-pay | Admitting: Primary Care

## 2022-06-13 ENCOUNTER — Other Ambulatory Visit: Payer: Self-pay

## 2022-06-13 MED ORDER — DULOXETINE HCL 30 MG PO CPEP
30.0000 mg | ORAL_CAPSULE | Freq: Every day | ORAL | 11 refills | Status: DC
  Filled 2022-06-13: qty 30, 30d supply, fill #0

## 2022-06-13 NOTE — Telephone Encounter (Signed)
Patient would like a call back with xray results. Call back is [phone]

## 2022-06-13 NOTE — Telephone Encounter (Signed)
Advised patient we will call him back when the xray has been resulted by Anda Kraft.

## 2022-06-14 ENCOUNTER — Other Ambulatory Visit: Payer: Self-pay

## 2022-06-16 NOTE — Progress Notes (Unsigned)
    Birttany Dechellis T. Katrinna Travieso, MD, [place] at [facility] [place], [address]  Phone: [phone]  FAX: [place] y.o. male  MRN [id number]  Date of Birth: 12-Sep-1970  Date: 06/17/2022  PCP: Pleas Koch, NP  Referral: Pleas Koch, NP  No chief complaint on file.  Subjective:   Detravious Kirchen. is a 52 y.o. very pleasant male patient with There is no height or weight on file to calculate BMI. who presents with the following:  Pleasant 52 year old male who presents with acute L knee pain.  He has a history of left-sided partial knee arthroplasty done in roughly 20 years ago.  He also has a recent history of some falls.  Pain has been more in the inferior aspect the patella, but also radiates up upwards of the knee and he has altered motion.  I did independently review the patient's complete knee series, nonweightbearing, and he is partial knee replacement looks well without any signs of loosening.  This was a lateral compartmental partial knee.  The medial compartment is mostly preserved.  He has tried multiple modalities and medications for his knee pain including oxycodone, Lyrica, Tylenol, tramadol, and heat.  None of this has provided him with significant relief.    Review of Systems is noted in the HPI, as appropriate  Objective:   There were no vitals taken for this visit.  GEN: No acute distress; alert,appropriate. PULM: Breathing comfortably in no respiratory distress PSYCH: Normally interactive.   Laboratory and Imaging Data:  Assessment and Plan:   ***

## 2022-06-17 ENCOUNTER — Encounter: Payer: Self-pay | Admitting: Family Medicine

## 2022-06-17 ENCOUNTER — Ambulatory Visit (INDEPENDENT_AMBULATORY_CARE_PROVIDER_SITE_OTHER): Payer: BC Managed Care – PPO | Admitting: Family Medicine

## 2022-06-17 ENCOUNTER — Other Ambulatory Visit: Payer: Self-pay | Admitting: *Deleted

## 2022-06-17 ENCOUNTER — Other Ambulatory Visit: Payer: Self-pay

## 2022-06-17 VITALS — BP 120/80 | HR 80 | Temp 97.6°F | Ht 67.0 in | Wt 149.4 lb

## 2022-06-17 DIAGNOSIS — C9002 Multiple myeloma in relapse: Secondary | ICD-10-CM

## 2022-06-17 DIAGNOSIS — M25562 Pain in left knee: Secondary | ICD-10-CM

## 2022-06-17 LAB — MULTIPLE MYELOMA PANEL, SERUM
Albumin SerPl Elph-Mcnc: 3.4 g/dL (ref 2.9–4.4)
Albumin/Glob SerPl: 1.5 (ref 0.7–1.7)
Alpha 1: 0.2 g/dL (ref 0.0–0.4)
Alpha2 Glob SerPl Elph-Mcnc: 0.7 g/dL (ref 0.4–1.0)
B-Globulin SerPl Elph-Mcnc: 0.8 g/dL (ref 0.7–1.3)
Gamma Glob SerPl Elph-Mcnc: 0.7 g/dL (ref 0.4–1.8)
Globulin, Total: 2.4 g/dL (ref 2.2–3.9)
IgA: 108 mg/dL (ref 90–386)
IgG (Immunoglobin G), Serum: 811 mg/dL (ref 603–1613)
IgM (Immunoglobulin M), Srm: 47 mg/dL (ref 20–172)
Total Protein ELP: 5.8 g/dL — ABNORMAL LOW (ref 6.0–8.5)

## 2022-06-17 MED ORDER — CELECOXIB 200 MG PO CAPS
200.0000 mg | ORAL_CAPSULE | Freq: Every day | ORAL | 2 refills | Status: AC
  Filled 2022-06-17: qty 30, 30d supply, fill #0
  Filled 2022-08-13: qty 30, 30d supply, fill #1

## 2022-06-18 ENCOUNTER — Inpatient Hospital Stay: Payer: BC Managed Care – PPO

## 2022-06-18 ENCOUNTER — Other Ambulatory Visit: Payer: Self-pay | Admitting: *Deleted

## 2022-06-18 ENCOUNTER — Encounter: Payer: Self-pay | Admitting: *Deleted

## 2022-06-18 ENCOUNTER — Inpatient Hospital Stay (HOSPITAL_BASED_OUTPATIENT_CLINIC_OR_DEPARTMENT_OTHER): Payer: BC Managed Care – PPO | Admitting: Oncology

## 2022-06-18 ENCOUNTER — Encounter: Payer: Self-pay | Admitting: Oncology

## 2022-06-18 ENCOUNTER — Other Ambulatory Visit: Payer: Self-pay

## 2022-06-18 VITALS — BP 150/100 | HR 82 | Temp 97.6°F | Resp 18 | Ht 69.0 in | Wt 146.9 lb

## 2022-06-18 DIAGNOSIS — G620 Drug-induced polyneuropathy: Secondary | ICD-10-CM | POA: Diagnosis not present

## 2022-06-18 DIAGNOSIS — Z79899 Other long term (current) drug therapy: Secondary | ICD-10-CM | POA: Diagnosis not present

## 2022-06-18 DIAGNOSIS — C9002 Multiple myeloma in relapse: Secondary | ICD-10-CM

## 2022-06-18 DIAGNOSIS — T451X5A Adverse effect of antineoplastic and immunosuppressive drugs, initial encounter: Secondary | ICD-10-CM

## 2022-06-18 DIAGNOSIS — Z5111 Encounter for antineoplastic chemotherapy: Secondary | ICD-10-CM

## 2022-06-18 DIAGNOSIS — Z5112 Encounter for antineoplastic immunotherapy: Secondary | ICD-10-CM | POA: Diagnosis not present

## 2022-06-18 LAB — CBC WITH DIFFERENTIAL/PLATELET
Abs Immature Granulocytes: 0.01 10*3/uL (ref 0.00–0.07)
Basophils Absolute: 0 10*3/uL (ref 0.0–0.1)
Basophils Relative: 1 %
Eosinophils Absolute: 0.2 10*3/uL (ref 0.0–0.5)
Eosinophils Relative: 3 %
HCT: 43.7 % (ref 39.0–52.0)
Hemoglobin: 14.8 g/dL (ref 13.0–17.0)
Immature Granulocytes: 0 %
Lymphocytes Relative: 13 %
Lymphs Abs: 0.7 10*3/uL (ref 0.7–4.0)
MCH: 31.5 pg (ref 26.0–34.0)
MCHC: 33.9 g/dL (ref 30.0–36.0)
MCV: 93 fL (ref 80.0–100.0)
Monocytes Absolute: 0.3 10*3/uL (ref 0.1–1.0)
Monocytes Relative: 6 %
Neutro Abs: 4 10*3/uL (ref 1.7–7.7)
Neutrophils Relative %: 77 %
Platelets: 207 10*3/uL (ref 150–400)
RBC: 4.7 MIL/uL (ref 4.22–5.81)
RDW: 13.4 % (ref 11.5–15.5)
WBC: 5.2 10*3/uL (ref 4.0–10.5)
nRBC: 0 % (ref 0.0–0.2)

## 2022-06-18 LAB — CMP (CANCER CENTER ONLY)
ALT: 57 U/L — ABNORMAL HIGH (ref 0–44)
AST: 36 U/L (ref 15–41)
Albumin: 3.8 g/dL (ref 3.5–5.0)
Alkaline Phosphatase: 116 U/L (ref 38–126)
Anion gap: 6 (ref 5–15)
BUN: 11 mg/dL (ref 6–20)
CO2: 24 mmol/L (ref 22–32)
Calcium: 8.9 mg/dL (ref 8.9–10.3)
Chloride: 108 mmol/L (ref 98–111)
Creatinine: 1.28 mg/dL — ABNORMAL HIGH (ref 0.61–1.24)
GFR, Estimated: >60 mL/min (ref >60)
Glucose, Bld: 100 mg/dL — ABNORMAL HIGH (ref 70–99)
Potassium: 3.2 mmol/L — ABNORMAL LOW (ref 3.5–5.1)
Sodium: 138 mmol/L (ref 135–145)
Total Bilirubin: 0.7 mg/dL (ref 0.3–1.2)
Total Protein: 6.8 g/dL (ref 6.5–8.1)

## 2022-06-18 MED ORDER — POTASSIUM CHLORIDE CRYS ER 20 MEQ PO TBCR
20.0000 meq | EXTENDED_RELEASE_TABLET | Freq: Every day | ORAL | 0 refills | Status: DC
  Filled 2022-06-18: qty 21, 21d supply, fill #0

## 2022-06-18 MED ORDER — OXYCODONE HCL 10 MG PO TABS
10.0000 mg | ORAL_TABLET | Freq: Three times a day (TID) | ORAL | 0 refills | Status: DC | PRN
  Filled 2022-06-18: qty 90, 30d supply, fill #0

## 2022-06-18 MED ORDER — DARATUMUMAB-HYALURONIDASE-FIHJ 1800-30000 MG-UT/15ML ~~LOC~~ SOLN
1800.0000 mg | Freq: Once | SUBCUTANEOUS | Status: AC
  Administered 2022-06-18: 1800 mg via SUBCUTANEOUS
  Filled 2022-06-18: qty 15

## 2022-06-18 NOTE — Progress Notes (Signed)
Letter for work

## 2022-06-18 NOTE — Progress Notes (Signed)
Patient wants to know if the chemo is working.patient saw pcp and has to have a mri done.

## 2022-06-18 NOTE — Patient Instructions (Signed)
[facility]  Discharge Instructions: Thank you for choosing [facility] to provide your oncology and hematology care.  If you have a lab appointment with the [facility], please go directly to the [facility] and check in at the registration area.  Wear comfortable clothing and clothing appropriate for easy access to any Portacath or PICC line.   We strive to give you quality time with your provider. You may need to reschedule your appointment if you arrive late (15 or more minutes).  Arriving late affects you and other patients whose appointments are after yours.  Also, if you miss three or more appointments without notifying the office, you may be dismissed from the clinic at the provider's discretion.      For prescription refill requests, have your pharmacy contact our office and allow 72 hours for refills to be completed.    Today you received the following chemotherapy and/or immunotherapy agents DARZALEX      To help prevent nausea and vomiting after your treatment, we encourage you to take your nausea medication as directed.  BELOW ARE SYMPTOMS THAT SHOULD BE REPORTED IMMEDIATELY: *FEVER GREATER THAN 100.4 F (38 C) OR HIGHER *CHILLS OR SWEATING *NAUSEA AND VOMITING THAT IS NOT CONTROLLED WITH YOUR NAUSEA MEDICATION *UNUSUAL SHORTNESS OF BREATH *UNUSUAL BRUISING OR BLEEDING *URINARY PROBLEMS (pain or burning when urinating, or frequent urination) *BOWEL PROBLEMS (unusual diarrhea, constipation, pain near the anus) TENDERNESS IN MOUTH AND THROAT WITH OR WITHOUT PRESENCE OF ULCERS (sore throat, sores in mouth, or a toothache) UNUSUAL RASH, SWELLING OR PAIN  UNUSUAL VAGINAL DISCHARGE OR ITCHING   Items with * indicate a potential emergency and should be followed up as soon as possible or go to the Emergency Department if any problems should occur.  Please show the CHEMOTHERAPY ALERT CARD or IMMUNOTHERAPY ALERT CARD at check-in to  the Emergency Department and triage nurse.  Should you have questions after your visit or need to cancel or reschedule your appointment, please contact [facility]  [phone] and follow the prompts.  Office hours are 8:00 a.m. to 4:30 p.m. Monday - Friday. Please note that voicemails left after 4:00 p.m. may not be returned until the following business day.  We are closed weekends and major holidays. You have access to a nurse at all times for urgent questions. Please call the main number to the clinic [phone] and follow the prompts.  For any non-urgent questions, you may also contact your provider using MyChart. We now offer e-Visits for anyone 8 and older to request care online for non-urgent symptoms. For details visit [email or web address].   Also download the MyChart app! Go to the app store, search "MyChart", open the app, select , and log in with your MyChart username and password.  Daratumumab Injection What is this medication? DARATUMUMAB (dar a toom ue mab) treats multiple myeloma, a type of bone marrow cancer. It works by helping your immune system slow or stop the spread of cancer cells. It is a monoclonal antibody. This medicine may be used for other purposes; ask your health care provider or pharmacist if you have questions. COMMON BRAND NAME(S): DARZALEX What should I tell my care team before I take this medication? They need to know if you have any of these conditions: Hereditary fructose intolerance Infection, such as chickenpox, herpes, hepatitis B Lung or breathing disease, such as asthma, COPD An unusual or allergic reaction to daratumumab, sorbitol, other  medications, foods, dyes, or preservatives Pregnant or trying to get pregnant Breastfeeding How should I use this medication? This medication is injected into a vein. It is given by your care team in a hospital or clinic setting. Talk to your care team about the use  of this medication in children. Special care may be needed. Overdosage: If you think you have taken too much of this medicine contact a poison control center or emergency room at once. NOTE: This medicine is only for you. Do not share this medicine with others. What if I miss a dose? Keep appointments for follow-up doses. It is important not to miss your dose. Call your care team if you are unable to keep an appointment. What may interact with this medication? Interactions have not been studied. This list may not describe all possible interactions. Give your health care provider a list of all the medicines, herbs, non-prescription drugs, or dietary supplements you use. Also tell them if you smoke, drink alcohol, or use illegal drugs. Some items may interact with your medicine. What should I watch for while using this medication? Your condition will be monitored carefully while you are receiving this medication. This medication can cause serious allergic reactions. To reduce your risk, your care team may give you other medication to take before receiving this one. Be sure to follow the directions from your care team. This medication can affect the results of blood tests to match your blood type. These changes can last for up to 6 months after the final dose. Your care team will do blood tests to match your blood type before you start treatment. Tell all of your care team that you are being treated with this medication before receiving a blood transfusion. This medication can affect the results of some tests used to determine treatment response; extra tests may be needed to evaluate response. Talk to your care team if you wish to become pregnant or think you are pregnant. This medication can cause serious birth defects if taken during pregnancy and for 3 months after the last dose. A reliable form of contraception is recommended while taking this medication and for 3 months after the last dose. Talk to  your care team about effective forms of contraception. Do not breast-feed while taking this medication. What side effects may I notice from receiving this medication? Side effects that you should report to your care team as soon as possible: Allergic reactions--skin rash, itching, hives, swelling of the face, lips, tongue, or throat Infection--fever, chills, cough, sore throat, wounds that don't heal, pain or trouble when passing urine, general feeling of discomfort or being unwell Infusion reactions--chest pain, shortness of breath or trouble breathing, feeling faint or lightheaded Unusual bruising or bleeding Side effects that usually do not require medical attention (report to your care team if they continue or are bothersome): Constipation Diarrhea Fatigue Nausea Pain, tingling, or numbness in the hands or feet Swelling of the ankles, hands, or feet This list may not describe all possible side effects. Call your doctor for medical advice about side effects. You may report side effects to FDA at 1-800-FDA-1088. Where should I keep my medication? This medication is given in a hospital or clinic. It will not be stored at home. NOTE: This sheet is a summary. It may not cover all possible information. If you have questions about this medicine, talk to your doctor, pharmacist, or health care provider.  2023 Elsevier/Gold Standard (2021-08-01 00:00:00)

## 2022-06-18 NOTE — Progress Notes (Signed)
Hematology/Oncology Consult note [facility]  [phone] [phone]  Patient Care Team: Pleas Koch, NP as PCP - General (Internal Medicine) Vella Redhead, MD as PCP - Hematology/Oncology Sindy Guadeloupe, MD as Consulting Physician (Hematology and Oncology)   Name of the patient: James House  [id number]  01-25-1971   Date of visit: 06/18/22  Diagnosis- kappa light chain multiple myeloma in relapse   Chief complaint/ Reason for visit-on treatment assessment prior to cycle 1 day 15 of Darzalex  Heme/Onc history: Patient is a 52-b year-old male with a history of kappa light chain myeloma that was treated by [name].  History is as follows:   1.  Patient presented with renal insufficiency with a creatinine of 2.4 in April 2018.  Kidney biopsy showed myeloma cast nephropathy.  Kappa light chain was 3004 and lambda 0.22 with a kappa lambda ratio of 13,000 655.  Skeletal survey showed multiple calvarial and marrow lesions.  Bone marrow biopsy in May 2018 showed 43% plasma cells.  He received CyBorD for cycle 1 in May 2018 and was subsequently switched to RVD.  He received 4 cycles followed by a repeat bone marrow biopsy in August 2018 which showed no increase in plasma cells.   2.  He underwent autologous stem cell transplantation on 01/30/2017.  He was started on Revlimid maintenance 10 mg daily in January 2019.   3.  Labs from January February and March 2019 done at [facility] showed no M spike, normal kappa lambda ratio of 1.06.  He was last seen by them in April 2019.   4. Following that patient was admitted to [facility] for healthcare associated pneumonia and was recently discharged.  He wished to transfer his care to [place] at this time.  He was on maintenance Revlimid 5 mg which was then reduced to 2.5 mg.  Follows up with pain clinic for chemo-induced peripheral neuropathy with Dr. Dossie Arbour.  His urine tested positive for [facility] and therefore  patient was discharged from pain clinic   5.  Noted to have gradually increasing free kappa light chainFrom 15 in 20 21-39.5 with a ratio of 3.09.  He was seen by [facility] again and underwent a repeat bone marrow biopsy which did not show any evidence of clonal plasma cells.  PET CT scan also did not show any new active lytic lesions in May 2023.Marland Kitchen  Revlimid dose was increased to 15 mg 3 weeks on 1 week off but kappa light chains show continuing to increase   6.  PET CT scan in December 2023 did not show any evidence of active myeloma.  Patient underwent repeat bone marrow biopsy at [facility] which showed 6% plasma cellsConsistent with persistent plasma cell neoplasm.  74 to 86% of isolated plasma cells show loss of T p53 and monosomy 13.  Patient was seen by [name] at [facility] and was recommended Darzalex Pomalyst dexamethasone regimen    Interval history-patient states that he is not taking any oxycodone presently because of being discharged from pain clinic.  His urine had tested positive for THC.  He was previously using 10 mg of oxycodone every 8 hours as needed.  Reports ongoing fatigue  ECOG PS- 1 Pain scale- 4   Review of systems- Review of Systems  Constitutional:  Positive for malaise/fatigue. Negative for chills, fever and weight loss.  HENT:  Negative for congestion, ear discharge and nosebleeds.   Eyes:  Negative for blurred vision.  Respiratory:  Negative  for cough, hemoptysis, sputum production, shortness of breath and wheezing.   Cardiovascular:  Negative for chest pain, palpitations, orthopnea and claudication.  Gastrointestinal:  Negative for abdominal pain, blood in stool, constipation, diarrhea, heartburn, melena, nausea and vomiting.  Genitourinary:  Negative for dysuria, flank pain, frequency, hematuria and urgency.  Musculoskeletal:  Positive for back pain. Negative for joint pain and myalgias.  Skin:  Negative for rash.  Neurological:  Negative for dizziness, tingling, focal  weakness, seizures, weakness and headaches.  Endo/Heme/Allergies:  Does not bruise/bleed easily.  Psychiatric/Behavioral:  Negative for depression and suicidal ideas. The patient does not have insomnia.       No Known Allergies   Past Medical History:  Diagnosis Date   CKD (chronic kidney disease) stage 3, GFR 30-59 ml/min (HCC)    Hypertension    Multiple myeloma ([place]) 08/11/2017   Neuropathy    Pneumonia    Severe sepsis ([place]) 08/11/2017   Shingles 08/03/2019     Past Surgical History:  Procedure Laterality Date   ABDOMINAL SURGERY     COLONOSCOPY WITH PROPOFOL N/A 05/25/2021   Procedure: COLONOSCOPY WITH PROPOFOL;  Surgeon: Lin Landsman, MD;  Location: [facility] ENDOSCOPY;  Service: Gastroenterology;  Laterality: N/A;   JOINT REPLACEMENT     right knee   KNEE SURGERY Left     Social History   Socioeconomic History   Marital status: Single    Spouse name: [name]   Number of children: 3   Years of education: Not on file   Highest education level: Not on file  Occupational History   Not on file  Tobacco Use   Smoking status: Former   Smokeless tobacco: Current    Types: Snuff  Vaping Use   Vaping Use: Never used  Substance and Sexual Activity   Alcohol use: No   Drug use: No   Sexual activity: Yes  Other Topics Concern   Not on file  Social History Narrative   Live in private residence with spouse and mother in law   Social Determinants of Health   Financial Resource Strain: [place]  (08/12/2017)   Overall Financial Resource Strain (CARDIA)    Difficulty of Paying Living Expenses: Not hard at all  Food Insecurity: No Food Insecurity (08/12/2017)   Hunger Vital Sign    Worried About Running Out of Food in the Last Year: Never true    [place] in the Last Year: Never true  Transportation Needs: No Transportation Needs (08/12/2017)   PRAPARE - Hydrologist (Medical): No    Lack of Transportation (Non-Medical): No   Physical Activity: Sufficiently Active (08/12/2017)   Exercise Vital Sign    Days of Exercise per Week: 7 days    Minutes of Exercise per Session: 30 min  Stress: No Stress Concern Present (08/12/2017)   [place]    Feeling of Stress : Only a little  Social Connections: Somewhat Isolated (08/12/2017)   Social Connection and Isolation Panel [NHANES]    Frequency of Communication with Friends and Family: Once a week    Frequency of Social Gatherings with Friends and Family: Once a week    Attends Religious Services: More than 4 times per year    Active Member of [employer] or Organizations: No    Attends Archivist Meetings: Never    Marital Status: Married  Human resources officer Violence: Not At Risk (08/12/2017)   Humiliation, Afraid,  Rape, and Kick questionnaire    Fear of Current or Ex-Partner: No    Emotionally Abused: No    Physically Abused: No    Sexually Abused: No    Family History  Problem Relation Age of Onset   Cancer Mother 6       Pancreatic   COPD Mother    Diabetes Mother    Hyperlipidemia Mother    Hypertension Mother    Cancer Maternal Uncle        pancreatic   Cancer Maternal Grandmother 26       colon   COPD Maternal Grandmother    Diabetes Maternal Grandmother    Hypertension Maternal Grandmother      Current Outpatient Medications:    acyclovir (ZOVIRAX) 400 MG tablet, Take 1 tablet (400 mg total) by mouth 2 (two) times daily., Disp: 60 tablet, Rfl: 11   amLODipine (NORVASC) 10 MG tablet, Take 1 tablet (10 mg total) by mouth daily. For blood pressure., Disp: 90 tablet, Rfl: 0   aspirin EC 81 MG tablet, Take 81 mg by mouth daily., Disp: , Rfl:    baclofen (LIORESAL) 10 MG tablet, Take 1 tablet (10 mg total) by mouth 4 (four) times daily as needed for muscle spasms., Disp: 360 tablet, Rfl: 0   celecoxib (CELEBREX) 200 MG capsule, Take 1 capsule (200 mg total) by mouth daily., Disp: 30  capsule, Rfl: 2   cyanocobalamin (VITAMIN B12) 1000 MCG/ML injection, Inject 1 mL (1,000 mcg total) into the muscle every 30 (thirty) days., Disp: 1 mL, Rfl: 11   dexamethasone (DECADRON) 4 MG tablet, Take 5 tablets (20 mg total) by mouth 2 (two) times a week. Take first dose 1 hour prior to reporting to clinic for daratumumab. Take 2nd dose the day after daratumumab., Disp: 40 tablet, Rfl: 1   DULoxetine (CYMBALTA) 30 MG capsule, Take 1 capsule (30 mg total) by mouth daily., Disp: 30 capsule, Rfl: 11   lidocaine-prilocaine (EMLA) cream, Apply to affected area once, Disp: 30 g, Rfl: 3   montelukast (SINGULAIR) 10 MG tablet, Take 1 tablet in the evening 1 day before your treatment. Day of treatment take 1 tablet in the morning for the first 2 treatments of Daratumumab, Disp: 4 tablet, Rfl: 0   OLANZapine (ZYPREXA) 10 MG tablet, Take 1 tablet (10 mg total) by mouth at bedtime. To help with nausea prevention., Disp: 30 tablet, Rfl: 1   olmesartan (BENICAR) 20 MG tablet, Take 1 tablet (20 mg total) by mouth daily. For blood pressure., Disp: 90 tablet, Rfl: 3   ondansetron (ZOFRAN) 8 MG tablet, Take 1 tablet (8 mg total) by mouth every 8 (eight) hours as needed for nausea or vomiting., Disp: 30 tablet, Rfl: 1   pomalidomide (POMALYST) 3 MG capsule, Take 1 capsule (3 mg total) by mouth daily. Take for 21 days, then hold for 7 days. Repeat every 28 days., Disp: 21 capsule, Rfl: 0   potassium chloride SA (KLOR-CON M) 20 MEQ tablet, Take 1 tablet (20 mEq total) by mouth daily., Disp: 21 tablet, Rfl: 0   pregabalin (LYRICA) 150 MG capsule, Take 1 capsule (150 mg total) by mouth 3 (three) times daily as needed (for pain)., Disp: 270 capsule, Rfl: 0   prochlorperazine (COMPAZINE) 10 MG tablet, Take 1 tablet (10 mg total) by mouth every 6 (six) hours as needed for nausea or vomiting., Disp: 60 tablet, Rfl: 0   prochlorperazine (COMPAZINE) 10 MG tablet, Take 1 tablet (10 mg total) by mouth every  6 (six) hours as  needed for nausea or vomiting., Disp: 30 tablet, Rfl: 1   sertraline (ZOLOFT) 25 MG tablet, Take 1 tablet (25 mg total) by mouth daily., Disp: 30 tablet, Rfl: 11   SUMAtriptan (IMITREX) 50 MG tablet, Take 1 tablet by mouth at migraine onset. May repeat in 2 hours if headache persists or recurs., Disp: 10 tablet, Rfl: 0   Syringe/Needle, Disp, (SYRINGE 3CC/25GX1") 25G X 1" 3 ML MISC, 1 Syringe by Does not apply route every 30 (thirty) days., Disp: 12 each, Rfl: 0   topiramate (TOPAMAX) 50 MG tablet, Take 1 tablet (50 mg total) by mouth at bedtime. For headache prevention., Disp: 90 tablet, Rfl: 2   Oxycodone HCl 10 MG TABS, Take 1 tablet (10 mg total) by mouth every 8 (eight) hours as needed., Disp: 90 tablet, Rfl: 0  Physical exam:  Vitals:   06/18/22 0835  BP: (!) 150/100  Pulse: 82  Resp: 18  Temp: 97.6 F (36.4 C)  TempSrc: Tympanic  SpO2: 100%  Weight: 146 lb 14.4 oz (66.6 kg)  Height: '5\' 9"'$  (1.753 m)   Physical Exam Cardiovascular:     Rate and Rhythm: Normal rate and regular rhythm.     Heart sounds: Normal heart sounds.  Pulmonary:     Effort: Pulmonary effort is normal.     Breath sounds: Normal breath sounds.  Skin:    General: Skin is warm and dry.  Neurological:     Mental Status: He is alert and oriented to person, place, and time.         Latest Ref Rng & Units 06/18/2022    8:16 AM  CMP  Glucose 70 - 99 mg/dL 100   BUN 6 - 20 mg/dL 11   Creatinine 0.61 - 1.24 mg/dL 1.28   Sodium 135 - 145 mmol/L 138   Potassium 3.5 - 5.1 mmol/L 3.2   Chloride 98 - 111 mmol/L 108   CO2 22 - 32 mmol/L 24   Calcium 8.9 - 10.3 mg/dL 8.9   Total Protein 6.5 - 8.1 g/dL 6.8   Total Bilirubin 0.3 - 1.2 mg/dL 0.7   Alkaline Phos 38 - 126 U/L 116   AST 15 - 41 U/L 36   ALT 0 - 44 U/L 57       Latest Ref Rng & Units 06/18/2022    8:16 AM  CBC  WBC 4.0 - 10.5 K/uL 5.2   Hemoglobin 13.0 - 17.0 g/dL 14.8   Hematocrit 39.0 - 52.0 % 43.7   Platelets 150 - 400 K/uL 207     No  images are attached to the encounter.  DG Knee Complete 4 Views Left  Result Date: 06/12/2022 CLINICAL DATA:  Fall in January. History of partial knee replacement. EXAM: LEFT KNEE - COMPLETE 4+ VIEW COMPARISON:  Knee radiographs 08/25/2017 FINDINGS: The patient is status post partial knee arthroplasty of the lateral compartment. Hardware alignment is within expected limits, without evidence of complication. Knee alignment is normal. There is no evidence of acute fracture or dislocation. The medial compartment joint space is preserved, without significant osteophytosis. The soft tissues are unremarkable. There is no effusion. IMPRESSION: No acute fracture or dislocation. Electronically Signed   By: Valetta Mole M.D.   On: 06/12/2022 15:52     Assessment and plan- Patient is a 52 y.o. male with history of kappa light chain multiple myeloma currently in relapse here for on treatment assessment prior to cycle 1 day 15 of Darzalex  Counts okay to proceed with cycle 1 day 15 of Darzalex today.  He will directly proceed for cycle 1 day 22 next week and cycle 2-day 1 in 2 weeks and I will see him back in 3 weeks for cycle 2-day 8.  Will repeat myeloma labs again in 2 weeks.  Labs from today are pending.  Patient will continue Pomalyst 3 mg 3 weeks on and 1 week of which she is tolerating it well.  Continue acyclovir prophylaxis along with low-dose aspirin.  He is also on 20 mg of weekly Decadron.  Chemo-induced peripheral neuropathy: He is presently on Lyrica and he has been on longstanding oxycodone 10 mg every 8 hours as needed which she was previously getting through pain clinic.  He does not have any bone lesions from myeloma and his pain is nonmalignant in his back as well as secondary to his neuropathy.  I will restart his oxycodone since he is unable to get it through pain clinic but I did remind him that I would be checking urine toxicology periodically.  That THC sometimes can be laced with other  agents such as cocaine and amphetamines which can be detected even if he is only using CBD.  Patient understands and agrees to hold off on CBD usage.   Visit Diagnosis 1. Encounter for antineoplastic chemotherapy   2. Multiple myeloma in relapse ([place])   3. High risk medication use   4. Chemotherapy-induced peripheral neuropathy ([place])      Dr. Randa Evens, MD, MPH [facility] [id number] 06/18/2022 12:41 PM

## 2022-06-19 LAB — KAPPA/LAMBDA LIGHT CHAINS
Kappa free light chain: 332.9 mg/L — ABNORMAL HIGH (ref 3.3–19.4)
Kappa, lambda light chain ratio: 39.16 — ABNORMAL HIGH (ref 0.26–1.65)
Lambda free light chains: 8.5 mg/L (ref 5.7–26.3)

## 2022-06-21 ENCOUNTER — Other Ambulatory Visit: Payer: Self-pay | Admitting: Oncology

## 2022-06-21 DIAGNOSIS — C9000 Multiple myeloma not having achieved remission: Secondary | ICD-10-CM

## 2022-06-24 ENCOUNTER — Other Ambulatory Visit: Payer: Self-pay

## 2022-06-24 ENCOUNTER — Telehealth: Payer: Self-pay | Admitting: *Deleted

## 2022-06-24 LAB — MULTIPLE MYELOMA PANEL, SERUM
Albumin SerPl Elph-Mcnc: 3.5 g/dL (ref 2.9–4.4)
Albumin/Glob SerPl: 1.6 (ref 0.7–1.7)
Alpha 1: 0.2 g/dL (ref 0.0–0.4)
Alpha2 Glob SerPl Elph-Mcnc: 0.7 g/dL (ref 0.4–1.0)
B-Globulin SerPl Elph-Mcnc: 0.8 g/dL (ref 0.7–1.3)
Gamma Glob SerPl Elph-Mcnc: 0.7 g/dL (ref 0.4–1.8)
Globulin, Total: 2.3 g/dL (ref 2.2–3.9)
IgA: 102 mg/dL (ref 90–386)
IgG (Immunoglobin G), Serum: 779 mg/dL (ref 603–1613)
IgM (Immunoglobulin M), Srm: 51 mg/dL (ref 20–172)
M Protein SerPl Elph-Mcnc: 0.1 g/dL — ABNORMAL HIGH
Total Protein ELP: 5.8 g/dL — ABNORMAL LOW (ref 6.0–8.5)

## 2022-06-24 NOTE — Telephone Encounter (Signed)
Tried to get approval from insurance and the code I got would not work. I tried cover my meds to help but they were busy and after 45 min. And no one came and I had to go back to work with pts' in the cancer center. I called the [facility] and pt. Paid cash for his rx for his oxycodone

## 2022-06-25 ENCOUNTER — Inpatient Hospital Stay: Payer: BC Managed Care – PPO | Attending: Internal Medicine

## 2022-06-25 ENCOUNTER — Inpatient Hospital Stay (HOSPITAL_BASED_OUTPATIENT_CLINIC_OR_DEPARTMENT_OTHER): Payer: BC Managed Care – PPO | Admitting: Oncology

## 2022-06-25 ENCOUNTER — Inpatient Hospital Stay: Payer: BC Managed Care – PPO

## 2022-06-25 VITALS — BP 155/96 | HR 70

## 2022-06-25 VITALS — BP 151/102 | HR 79 | Temp 96.9°F | Ht 68.0 in | Wt 149.8 lb

## 2022-06-25 DIAGNOSIS — Z9484 Stem cells transplant status: Secondary | ICD-10-CM | POA: Diagnosis not present

## 2022-06-25 DIAGNOSIS — Z5112 Encounter for antineoplastic immunotherapy: Secondary | ICD-10-CM | POA: Diagnosis not present

## 2022-06-25 DIAGNOSIS — G8929 Other chronic pain: Secondary | ICD-10-CM | POA: Insufficient documentation

## 2022-06-25 DIAGNOSIS — Z79891 Long term (current) use of opiate analgesic: Secondary | ICD-10-CM | POA: Diagnosis not present

## 2022-06-25 DIAGNOSIS — Z7961 Long term (current) use of immunomodulator: Secondary | ICD-10-CM | POA: Insufficient documentation

## 2022-06-25 DIAGNOSIS — C9002 Multiple myeloma in relapse: Secondary | ICD-10-CM

## 2022-06-25 DIAGNOSIS — C9001 Multiple myeloma in remission: Secondary | ICD-10-CM

## 2022-06-25 DIAGNOSIS — Z79899 Other long term (current) drug therapy: Secondary | ICD-10-CM | POA: Insufficient documentation

## 2022-06-25 DIAGNOSIS — G620 Drug-induced polyneuropathy: Secondary | ICD-10-CM | POA: Insufficient documentation

## 2022-06-25 DIAGNOSIS — F5104 Psychophysiologic insomnia: Secondary | ICD-10-CM | POA: Diagnosis not present

## 2022-06-25 DIAGNOSIS — Z5111 Encounter for antineoplastic chemotherapy: Secondary | ICD-10-CM

## 2022-06-25 DIAGNOSIS — T451X5A Adverse effect of antineoplastic and immunosuppressive drugs, initial encounter: Secondary | ICD-10-CM | POA: Insufficient documentation

## 2022-06-25 LAB — CBC WITH DIFFERENTIAL/PLATELET
Abs Immature Granulocytes: 0.01 10*3/uL (ref 0.00–0.07)
Basophils Absolute: 0 10*3/uL (ref 0.0–0.1)
Basophils Relative: 1 %
Eosinophils Absolute: 0.4 10*3/uL (ref 0.0–0.5)
Eosinophils Relative: 10 %
HCT: 44.2 % (ref 39.0–52.0)
Hemoglobin: 15.4 g/dL (ref 13.0–17.0)
Immature Granulocytes: 0 %
Lymphocytes Relative: 26 %
Lymphs Abs: 1 10*3/uL (ref 0.7–4.0)
MCH: 31.9 pg (ref 26.0–34.0)
MCHC: 34.8 g/dL (ref 30.0–36.0)
MCV: 91.5 fL (ref 80.0–100.0)
Monocytes Absolute: 0.5 10*3/uL (ref 0.1–1.0)
Monocytes Relative: 12 %
Neutro Abs: 1.9 10*3/uL (ref 1.7–7.7)
Neutrophils Relative %: 51 %
Platelets: 199 10*3/uL (ref 150–400)
RBC: 4.83 MIL/uL (ref 4.22–5.81)
RDW: 13.7 % (ref 11.5–15.5)
WBC: 3.8 10*3/uL — ABNORMAL LOW (ref 4.0–10.5)
nRBC: 0 % (ref 0.0–0.2)

## 2022-06-25 LAB — CMP (CANCER CENTER ONLY)
ALT: 35 U/L (ref 0–44)
AST: 32 U/L (ref 15–41)
Albumin: 3.8 g/dL (ref 3.5–5.0)
Alkaline Phosphatase: 122 U/L (ref 38–126)
Anion gap: 9 (ref 5–15)
BUN: 17 mg/dL (ref 6–20)
CO2: 25 mmol/L (ref 22–32)
Calcium: 8.9 mg/dL (ref 8.9–10.3)
Chloride: 103 mmol/L (ref 98–111)
Creatinine: 1.51 mg/dL — ABNORMAL HIGH (ref 0.61–1.24)
GFR, Estimated: 56 mL/min — ABNORMAL LOW (ref >60)
Glucose, Bld: 92 mg/dL (ref 70–99)
Potassium: 3.5 mmol/L (ref 3.5–5.1)
Sodium: 137 mmol/L (ref 135–145)
Total Bilirubin: 0.4 mg/dL (ref 0.3–1.2)
Total Protein: 6.8 g/dL (ref 6.5–8.1)

## 2022-06-25 MED ORDER — DARATUMUMAB-HYALURONIDASE-FIHJ 1800-30000 MG-UT/15ML ~~LOC~~ SOLN
1800.0000 mg | Freq: Once | SUBCUTANEOUS | Status: AC
  Administered 2022-06-25: 1800 mg via SUBCUTANEOUS
  Filled 2022-06-25: qty 15

## 2022-06-25 NOTE — Patient Instructions (Signed)
[facility]  Discharge Instructions: Thank you for choosing [facility] to provide your oncology and hematology care.  If you have a lab appointment with the [facility], please go directly to the [facility] and check in at the registration area.  Wear comfortable clothing and clothing appropriate for easy access to any Portacath or PICC line.   We strive to give you quality time with your provider. You may need to reschedule your appointment if you arrive late (15 or more minutes).  Arriving late affects you and other patients whose appointments are after yours.  Also, if you miss three or more appointments without notifying the office, you may be dismissed from the clinic at the provider's discretion.      For prescription refill requests, have your pharmacy contact our office and allow 72 hours for refills to be completed.    Today you received the following chemotherapy and/or immunotherapy agents DARZALEX      To help prevent nausea and vomiting after your treatment, we encourage you to take your nausea medication as directed.  BELOW ARE SYMPTOMS THAT SHOULD BE REPORTED IMMEDIATELY: *FEVER GREATER THAN 100.4 F (38 C) OR HIGHER *CHILLS OR SWEATING *NAUSEA AND VOMITING THAT IS NOT CONTROLLED WITH YOUR NAUSEA MEDICATION *UNUSUAL SHORTNESS OF BREATH *UNUSUAL BRUISING OR BLEEDING *URINARY PROBLEMS (pain or burning when urinating, or frequent urination) *BOWEL PROBLEMS (unusual diarrhea, constipation, pain near the anus) TENDERNESS IN MOUTH AND THROAT WITH OR WITHOUT PRESENCE OF ULCERS (sore throat, sores in mouth, or a toothache) UNUSUAL RASH, SWELLING OR PAIN  UNUSUAL VAGINAL DISCHARGE OR ITCHING   Items with * indicate a potential emergency and should be followed up as soon as possible or go to the Emergency Department if any problems should occur.  Please show the CHEMOTHERAPY ALERT CARD or IMMUNOTHERAPY ALERT CARD at check-in to  the Emergency Department and triage nurse.  Should you have questions after your visit or need to cancel or reschedule your appointment, please contact [facility]  [phone] and follow the prompts.  Office hours are 8:00 a.m. to 4:30 p.m. Monday - Friday. Please note that voicemails left after 4:00 p.m. may not be returned until the following business day.  We are closed weekends and major holidays. You have access to a nurse at all times for urgent questions. Please call the main number to the clinic [phone] and follow the prompts.  For any non-urgent questions, you may also contact your provider using MyChart. We now offer e-Visits for anyone 65 and older to request care online for non-urgent symptoms. For details visit [email or web address].   Also download the MyChart app! Go to the app store, search "MyChart", open the app, select [facility], and log in with your MyChart username and password.  Daratumumab Injection What is this medication? DARATUMUMAB (dar a toom ue mab) treats multiple myeloma, a type of bone marrow cancer. It works by helping your immune system slow or stop the spread of cancer cells. It is a monoclonal antibody. This medicine may be used for other purposes; ask your health care provider or pharmacist if you have questions. COMMON BRAND NAME(S): DARZALEX What should I tell my care team before I take this medication? They need to know if you have any of these conditions: Hereditary fructose intolerance Infection, such as chickenpox, herpes, hepatitis B Lung or breathing disease, such as asthma, COPD An unusual or allergic reaction to daratumumab, sorbitol, other  medications, foods, dyes, or preservatives Pregnant or trying to get pregnant Breastfeeding How should I use this medication? This medication is injected into a vein. It is given by your care team in a hospital or clinic setting. Talk to your care team about the use  of this medication in children. Special care may be needed. Overdosage: If you think you have taken too much of this medicine contact a poison control center or emergency room at once. NOTE: This medicine is only for you. Do not share this medicine with others. What if I miss a dose? Keep appointments for follow-up doses. It is important not to miss your dose. Call your care team if you are unable to keep an appointment. What may interact with this medication? Interactions have not been studied. This list may not describe all possible interactions. Give your health care provider a list of all the medicines, herbs, non-prescription drugs, or dietary supplements you use. Also tell them if you smoke, drink alcohol, or use illegal drugs. Some items may interact with your medicine. What should I watch for while using this medication? Your condition will be monitored carefully while you are receiving this medication. This medication can cause serious allergic reactions. To reduce your risk, your care team may give you other medication to take before receiving this one. Be sure to follow the directions from your care team. This medication can affect the results of blood tests to match your blood type. These changes can last for up to 6 months after the final dose. Your care team will do blood tests to match your blood type before you start treatment. Tell all of your care team that you are being treated with this medication before receiving a blood transfusion. This medication can affect the results of some tests used to determine treatment response; extra tests may be needed to evaluate response. Talk to your care team if you wish to become pregnant or think you are pregnant. This medication can cause serious birth defects if taken during pregnancy and for 3 months after the last dose. A reliable form of contraception is recommended while taking this medication and for 3 months after the last dose. Talk to  your care team about effective forms of contraception. Do not breast-feed while taking this medication. What side effects may I notice from receiving this medication? Side effects that you should report to your care team as soon as possible: Allergic reactions--skin rash, itching, hives, swelling of the face, lips, tongue, or throat Infection--fever, chills, cough, sore throat, wounds that don't heal, pain or trouble when passing urine, general feeling of discomfort or being unwell Infusion reactions--chest pain, shortness of breath or trouble breathing, feeling faint or lightheaded Unusual bruising or bleeding Side effects that usually do not require medical attention (report to your care team if they continue or are bothersome): Constipation Diarrhea Fatigue Nausea Pain, tingling, or numbness in the hands or feet Swelling of the ankles, hands, or feet This list may not describe all possible side effects. Call your doctor for medical advice about side effects. You may report side effects to FDA at 1-800-FDA-1088. Where should I keep my medication? This medication is given in a hospital or clinic. It will not be stored at home. NOTE: This sheet is a summary. It may not cover all possible information. If you have questions about this medicine, talk to your doctor, pharmacist, or health care provider.  2023 Elsevier/Gold Standard (2021-08-01 00:00:00)

## 2022-06-25 NOTE — Progress Notes (Signed)
Hematology/Oncology Consult note [facility]  Telephone:([phone] [phone]  Patient Care Team: Pleas Koch, NP as PCP - General (Internal Medicine) Vella Redhead, MD as PCP - Hematology/Oncology Sindy Guadeloupe, MD as Consulting Physician (Hematology and Oncology)   Name of the patient: Kamori Orebaugh  [id number]  12-03-1970   Date of visit: 06/25/22  Diagnosis-  kappa light chain multiple myeloma in relapse    Chief complaint/ Reason for visit-on treatment assessment prior to cycle 1 day 22 of Darzalex  Heme/Onc history: Patient is a 52- year-old male with a history of kappa light chain myeloma that was treated by [name].  History is as follows:   1.  Patient presented with renal insufficiency with a creatinine of 2.4 in April 2018.  Kidney biopsy showed myeloma cast nephropathy.  Kappa light chain was 3004 and lambda 0.22 with a kappa lambda ratio of 13,000 655.  Skeletal survey showed multiple calvarial and marrow lesions.  Bone marrow biopsy in May 2018 showed 43% plasma cells.  He received CyBorD for cycle 1 in May 2018 and was subsequently switched to RVD.  He received 4 cycles followed by a repeat bone marrow biopsy in August 2018 which showed no increase in plasma cells.   2.  He underwent autologous stem cell transplantation on 01/30/2017.  He was started on Revlimid maintenance 10 mg daily in January 2019.   3.  Labs from January February and March 2019 done at [facility] showed no M spike, normal kappa lambda ratio of 1.06.  He was last seen by them in April 2019.   4. Following that patient was admitted to [facility] for healthcare associated pneumonia and was recently discharged.  He wished to transfer his care to [place] at this time.  He was on maintenance Revlimid 5 mg which was then reduced to 2.5 mg.  Follows up with pain clinic for chemo-induced peripheral neuropathy with Dr. Dossie Arbour.  His urine tested positive for [facility] and therefore  patient was discharged from pain clinic   5.  Noted to have gradually increasing free kappa light chainFrom 15 in 20 21-39.5 with a ratio of 3.09.  He was seen by [facility] again and underwent a repeat bone marrow biopsy which did not show any evidence of clonal plasma cells.  PET CT scan also did not show any new active lytic lesions in May 2023.Marland Kitchen  Revlimid dose was increased to 15 mg 3 weeks on 1 week off but kappa light chains show continuing to increase   6.  PET CT scan in December 2023 did not show any evidence of active myeloma.  Patient underwent repeat bone marrow biopsy at [facility] which showed 6% plasma cellsConsistent with persistent plasma cell neoplasm.  74 to 86% of isolated plasma cells show loss of T p53 and monosomy 13.  Patient was seen by [name] at [facility] and was recommended Darzalex Pomalyst dexamethasone regimen    Interval history-patient reports ongoing fatigue which is increased after starting treatments.  He has baseline pain in his hands and feet from neuropathy  ECOG PS- 1 Pain scale- 4 Opioid associated constipation- no  Review of systems- Review of Systems  Constitutional:  Positive for malaise/fatigue. Negative for chills, fever and weight loss.  HENT:  Negative for congestion, ear discharge and nosebleeds.   Eyes:  Negative for blurred vision.  Respiratory:  Negative for cough, hemoptysis, sputum production, shortness of breath and wheezing.   Cardiovascular:  Negative for  chest pain, palpitations, orthopnea and claudication.  Gastrointestinal:  Negative for abdominal pain, blood in stool, constipation, diarrhea, heartburn, melena, nausea and vomiting.  Genitourinary:  Negative for dysuria, flank pain, frequency, hematuria and urgency.  Musculoskeletal:  Negative for back pain, joint pain and myalgias.  Skin:  Negative for rash.  Neurological:  Positive for sensory change (Peripheral neuropathy). Negative for dizziness, tingling, focal weakness, seizures, weakness and  headaches.  Endo/Heme/Allergies:  Does not bruise/bleed easily.  Psychiatric/Behavioral:  Negative for depression and suicidal ideas. The patient does not have insomnia.       No Known Allergies   Past Medical History:  Diagnosis Date   CKD (chronic kidney disease) stage 3, GFR 30-59 ml/min (HCC)    Hypertension    Multiple myeloma ([place]) 08/11/2017   Neuropathy    Pneumonia    Severe sepsis ([place]) 08/11/2017   Shingles 08/03/2019     Past Surgical History:  Procedure Laterality Date   ABDOMINAL SURGERY     COLONOSCOPY WITH PROPOFOL N/A 05/25/2021   Procedure: COLONOSCOPY WITH PROPOFOL;  Surgeon: Lin Landsman, MD;  Location: [facility] ENDOSCOPY;  Service: Gastroenterology;  Laterality: N/A;   JOINT REPLACEMENT     right knee   KNEE SURGERY Left     Social History   Socioeconomic History   Marital status: Single    Spouse name: [name]   Number of children: 3   Years of education: Not on file   Highest education level: Not on file  Occupational History   Not on file  Tobacco Use   Smoking status: Former   Smokeless tobacco: Current    Types: Snuff  Vaping Use   Vaping Use: Never used  Substance and Sexual Activity   Alcohol use: No   Drug use: No   Sexual activity: Yes  Other Topics Concern   Not on file  Social History Narrative   Live in private residence with spouse and mother in law   Social Determinants of Health   Financial Resource Strain: [place]  (08/12/2017)   Overall Financial Resource Strain (CARDIA)    Difficulty of Paying Living Expenses: Not hard at all  Food Insecurity: No Food Insecurity (08/12/2017)   Hunger Vital Sign    Worried About Running Out of Food in the Last Year: Never true    [place] in the Last Year: Never true  Transportation Needs: No Transportation Needs (08/12/2017)   PRAPARE - Hydrologist (Medical): No    Lack of Transportation (Non-Medical): No  Physical Activity: Sufficiently Active  (08/12/2017)   Exercise Vital Sign    Days of Exercise per Week: 7 days    Minutes of Exercise per Session: 30 min  Stress: No Stress Concern Present (08/12/2017)   [place]    Feeling of Stress : Only a little  Social Connections: Somewhat Isolated (08/12/2017)   Social Connection and Isolation Panel [NHANES]    Frequency of Communication with Friends and Family: Once a week    Frequency of Social Gatherings with Friends and Family: Once a week    Attends Religious Services: More than 4 times per year    Active Member of [employer] or Organizations: No    Attends Archivist Meetings: Never    Marital Status: Married  Human resources officer Violence: Not At Risk (08/12/2017)   Humiliation, Afraid, Rape, and Kick questionnaire    Fear of Current or Ex-Partner:  No    Emotionally Abused: No    Physically Abused: No    Sexually Abused: No    Family History  Problem Relation Age of Onset   Cancer Mother 63       Pancreatic   COPD Mother    Diabetes Mother    Hyperlipidemia Mother    Hypertension Mother    Cancer Maternal Uncle        pancreatic   Cancer Maternal Grandmother 59       colon   COPD Maternal Grandmother    Diabetes Maternal Grandmother    Hypertension Maternal Grandmother      Current Outpatient Medications:    acyclovir (ZOVIRAX) 400 MG tablet, Take 1 tablet (400 mg total) by mouth 2 (two) times daily., Disp: 60 tablet, Rfl: 11   amLODipine (NORVASC) 10 MG tablet, Take 1 tablet (10 mg total) by mouth daily. For blood pressure., Disp: 90 tablet, Rfl: 0   aspirin EC 81 MG tablet, Take 81 mg by mouth daily., Disp: , Rfl:    baclofen (LIORESAL) 10 MG tablet, Take 1 tablet (10 mg total) by mouth 4 (four) times daily as needed for muscle spasms., Disp: 360 tablet, Rfl: 0   celecoxib (CELEBREX) 200 MG capsule, Take 1 capsule (200 mg total) by mouth daily., Disp: 30 capsule, Rfl: 2   cyanocobalamin (VITAMIN  B12) 1000 MCG/ML injection, Inject 1 mL (1,000 mcg total) into the muscle every 30 (thirty) days., Disp: 1 mL, Rfl: 11   dexamethasone (DECADRON) 4 MG tablet, Take 5 tablets (20 mg total) by mouth 2 (two) times a week. Take first dose 1 hour prior to reporting to clinic for daratumumab. Take 2nd dose the day after daratumumab., Disp: 40 tablet, Rfl: 1   DULoxetine (CYMBALTA) 30 MG capsule, Take 1 capsule (30 mg total) by mouth daily., Disp: 30 capsule, Rfl: 11   lidocaine-prilocaine (EMLA) cream, Apply to affected area once, Disp: 30 g, Rfl: 3   montelukast (SINGULAIR) 10 MG tablet, Take 1 tablet in the evening 1 day before your treatment. Day of treatment take 1 tablet in the morning for the first 2 treatments of Daratumumab, Disp: 4 tablet, Rfl: 0   OLANZapine (ZYPREXA) 10 MG tablet, Take 1 tablet (10 mg total) by mouth at bedtime. To help with nausea prevention., Disp: 30 tablet, Rfl: 1   olmesartan (BENICAR) 20 MG tablet, Take 1 tablet (20 mg total) by mouth daily. For blood pressure., Disp: 90 tablet, Rfl: 3   ondansetron (ZOFRAN) 8 MG tablet, Take 1 tablet (8 mg total) by mouth every 8 (eight) hours as needed for nausea or vomiting., Disp: 30 tablet, Rfl: 1   Oxycodone HCl 10 MG TABS, Take 1 tablet (10 mg total) by mouth every 8 (eight) hours as needed., Disp: 90 tablet, Rfl: 0   pomalidomide (POMALYST) 3 MG capsule, Take 1 capsule (3 mg total) by mouth daily. 1 tablet daily for 21 days and then 7 days off.  REMS: [id number] obtained on 06/23/22, Disp: 21 capsule, Rfl: 0   potassium chloride SA (KLOR-CON M) 20 MEQ tablet, Take 1 tablet (20 mEq total) by mouth daily., Disp: 21 tablet, Rfl: 0   pregabalin (LYRICA) 150 MG capsule, Take 1 capsule (150 mg total) by mouth 3 (three) times daily as needed (for pain)., Disp: 270 capsule, Rfl: 0   prochlorperazine (COMPAZINE) 10 MG tablet, Take 1 tablet (10 mg total) by mouth every 6 (six) hours as needed for nausea or vomiting., Disp: 60 tablet,  Rfl: 0    prochlorperazine (COMPAZINE) 10 MG tablet, Take 1 tablet (10 mg total) by mouth every 6 (six) hours as needed for nausea or vomiting., Disp: 30 tablet, Rfl: 1   sertraline (ZOLOFT) 25 MG tablet, Take 1 tablet (25 mg total) by mouth daily., Disp: 30 tablet, Rfl: 11   SUMAtriptan (IMITREX) 50 MG tablet, Take 1 tablet by mouth at migraine onset. May repeat in 2 hours if headache persists or recurs., Disp: 10 tablet, Rfl: 0   Syringe/Needle, Disp, (SYRINGE 3CC/25GX1") 25G X 1" 3 ML MISC, 1 Syringe by Does not apply route every 30 (thirty) days., Disp: 12 each, Rfl: 0   topiramate (TOPAMAX) 50 MG tablet, Take 1 tablet (50 mg total) by mouth at bedtime. For headache prevention., Disp: 90 tablet, Rfl: 2  Physical exam:  Vitals:   06/25/22 0859  BP: (!) 151/102  Pulse: 79  Temp: (!) 96.9 F (36.1 C)  TempSrc: Tympanic  Weight: 149 lb 12.8 oz (67.9 kg)  Height: '5\' 8"'$  (1.727 m)   Physical Exam Cardiovascular:     Rate and Rhythm: Normal rate and regular rhythm.     Heart sounds: Normal heart sounds.  Pulmonary:     Effort: Pulmonary effort is normal.     Breath sounds: Normal breath sounds.  Skin:    General: Skin is warm and dry.  Neurological:     Mental Status: He is alert and oriented to person, place, and time.         Latest Ref Rng & Units 06/25/2022    8:27 AM  CMP  Glucose 70 - 99 mg/dL 92   BUN 6 - 20 mg/dL 17   Creatinine 0.61 - 1.24 mg/dL 1.51   Sodium 135 - 145 mmol/L 137   Potassium 3.5 - 5.1 mmol/L 3.5   Chloride 98 - 111 mmol/L 103   CO2 22 - 32 mmol/L 25   Calcium 8.9 - 10.3 mg/dL 8.9   Total Protein 6.5 - 8.1 g/dL 6.8   Total Bilirubin 0.3 - 1.2 mg/dL 0.4   Alkaline Phos 38 - 126 U/L 122   AST 15 - 41 U/L 32   ALT 0 - 44 U/L 35       Latest Ref Rng & Units 06/25/2022    8:27 AM  CBC  WBC 4.0 - 10.5 K/uL 3.8   Hemoglobin 13.0 - 17.0 g/dL 15.4   Hematocrit 39.0 - 52.0 % 44.2   Platelets 150 - 400 K/uL 199     No images are attached to the encounter.  DG  Knee Complete 4 Views Left  Result Date: 06/12/2022 CLINICAL DATA:  Fall in January. History of partial knee replacement. EXAM: LEFT KNEE - COMPLETE 4+ VIEW COMPARISON:  Knee radiographs 08/25/2017 FINDINGS: The patient is status post partial knee arthroplasty of the lateral compartment. Hardware alignment is within expected limits, without evidence of complication. Knee alignment is normal. There is no evidence of acute fracture or dislocation. The medial compartment joint space is preserved, without significant osteophytosis. The soft tissues are unremarkable. There is no effusion. IMPRESSION: No acute fracture or dislocation. Electronically Signed   By: Valetta Mole M.D.   On: 06/12/2022 15:52     Assessment and plan- Patient is a 52 y.o. male  with history of kappa light chain multiple myeloma currently in relapse.  He is here for on treatment assessment prior to cycle 1 day 22 of Darzalex  Counts okay to proceed with cycle 1 day  22 of Darzalex today.  He will proceed for cycle 2-day 1 of Darzalex next week and I will see him back in 2 weeks for cycle 2-day 8.  He is currently on Pomalyst 3 mg 3 weeks on and 1 week off.  This would be his week off and he will restart again in 1 week.  Although his free light chain ratio was elevated as compared to prior it is too soon to say that the present regimen is not working.  Will continue to monitor this for the next 2 months before deciding further plan  Continue acyclovir prophylaxis and low-dose aspirin along with weekly Decadron 20 mg.  Chemo-induced peripheral neuropathy: He will continue Lyrica and oxycodone 10 mg every 8 hours as needed.  His urine tested positive for cannabis and therefore he is not following up with pain clinic anymore.   PET/CT scan did not show any evidence of lytic bone lesions and therefore he is not presently on any bisphosphonates.   Visit Diagnosis 1. Multiple myeloma in relapse ([place])   2. Encounter for antineoplastic  chemotherapy   3. High risk medication use      Dr. Randa Evens, MD, MPH [facility] at [facility] [id number] 06/25/2022 12:48 PM

## 2022-06-26 ENCOUNTER — Other Ambulatory Visit: Payer: Self-pay

## 2022-06-26 ENCOUNTER — Other Ambulatory Visit: Payer: BC Managed Care – PPO

## 2022-06-26 ENCOUNTER — Ambulatory Visit: Payer: BC Managed Care – PPO | Admitting: Oncology

## 2022-06-26 LAB — KAPPA/LAMBDA LIGHT CHAINS
Kappa free light chain: 349.9 mg/L — ABNORMAL HIGH (ref 3.3–19.4)
Kappa, lambda light chain ratio: 34.99 — ABNORMAL HIGH (ref 0.26–1.65)
Lambda free light chains: 10 mg/L (ref 5.7–26.3)

## 2022-06-27 NOTE — Telephone Encounter (Signed)
Pt is scheduled 06/29/2022 for MRI Knee

## 2022-06-28 ENCOUNTER — Other Ambulatory Visit: Payer: Self-pay

## 2022-06-28 LAB — MULTIPLE MYELOMA PANEL, SERUM
Albumin SerPl Elph-Mcnc: 3.4 g/dL (ref 2.9–4.4)
Albumin/Glob SerPl: 1.5 (ref 0.7–1.7)
Alpha 1: 0.1 g/dL (ref 0.0–0.4)
Alpha2 Glob SerPl Elph-Mcnc: 0.7 g/dL (ref 0.4–1.0)
B-Globulin SerPl Elph-Mcnc: 0.9 g/dL (ref 0.7–1.3)
Gamma Glob SerPl Elph-Mcnc: 0.6 g/dL (ref 0.4–1.8)
Globulin, Total: 2.3 g/dL (ref 2.2–3.9)
IgA: 95 mg/dL (ref 90–386)
IgG (Immunoglobin G), Serum: 721 mg/dL (ref 603–1613)
IgM (Immunoglobulin M), Srm: 48 mg/dL (ref 20–172)
M Protein SerPl Elph-Mcnc: 0.2 g/dL — ABNORMAL HIGH
Total Protein ELP: 5.7 g/dL — ABNORMAL LOW (ref 6.0–8.5)

## 2022-06-29 ENCOUNTER — Ambulatory Visit
Admission: RE | Admit: 2022-06-29 | Discharge: 2022-06-29 | Disposition: A | Discharge status: Home / Self Care | Payer: BC Managed Care – PPO | Source: Ambulatory Visit | Attending: Family Medicine | Admitting: Family Medicine

## 2022-06-29 DIAGNOSIS — M25562 Pain in left knee: Secondary | ICD-10-CM | POA: Diagnosis not present

## 2022-06-29 DIAGNOSIS — Z8579 Personal history of other malignant neoplasms of lymphoid, hematopoietic and related tissues: Secondary | ICD-10-CM | POA: Diagnosis not present

## 2022-06-29 DIAGNOSIS — M25462 Effusion, left knee: Secondary | ICD-10-CM | POA: Diagnosis not present

## 2022-06-29 DIAGNOSIS — C9002 Multiple myeloma in relapse: Secondary | ICD-10-CM

## 2022-07-02 ENCOUNTER — Inpatient Hospital Stay: Payer: BC Managed Care – PPO

## 2022-07-02 ENCOUNTER — Other Ambulatory Visit: Payer: Self-pay

## 2022-07-02 ENCOUNTER — Ambulatory Visit: Payer: BC Managed Care – PPO | Admitting: Nurse Practitioner

## 2022-07-02 VITALS — BP 165/98 | HR 72 | Temp 97.1°F | Resp 18 | Wt 148.7 lb

## 2022-07-02 DIAGNOSIS — T451X5A Adverse effect of antineoplastic and immunosuppressive drugs, initial encounter: Secondary | ICD-10-CM | POA: Diagnosis not present

## 2022-07-02 DIAGNOSIS — C9002 Multiple myeloma in relapse: Secondary | ICD-10-CM

## 2022-07-02 DIAGNOSIS — G8929 Other chronic pain: Secondary | ICD-10-CM | POA: Diagnosis not present

## 2022-07-02 DIAGNOSIS — Z9484 Stem cells transplant status: Secondary | ICD-10-CM | POA: Diagnosis not present

## 2022-07-02 DIAGNOSIS — Z5112 Encounter for antineoplastic immunotherapy: Secondary | ICD-10-CM | POA: Diagnosis not present

## 2022-07-02 DIAGNOSIS — Z7961 Long term (current) use of immunomodulator: Secondary | ICD-10-CM | POA: Diagnosis not present

## 2022-07-02 DIAGNOSIS — Z79891 Long term (current) use of opiate analgesic: Secondary | ICD-10-CM | POA: Diagnosis not present

## 2022-07-02 DIAGNOSIS — F5104 Psychophysiologic insomnia: Secondary | ICD-10-CM | POA: Diagnosis not present

## 2022-07-02 DIAGNOSIS — G620 Drug-induced polyneuropathy: Secondary | ICD-10-CM | POA: Diagnosis not present

## 2022-07-02 DIAGNOSIS — Z79899 Other long term (current) drug therapy: Secondary | ICD-10-CM | POA: Diagnosis not present

## 2022-07-02 LAB — CBC WITH DIFFERENTIAL/PLATELET
Abs Immature Granulocytes: 0.01 10*3/uL (ref 0.00–0.07)
Basophils Absolute: 0 10*3/uL (ref 0.0–0.1)
Basophils Relative: 1 %
Eosinophils Absolute: 0.2 10*3/uL (ref 0.0–0.5)
Eosinophils Relative: 6 %
HCT: 45.8 % (ref 39.0–52.0)
Hemoglobin: 15.8 g/dL (ref 13.0–17.0)
Immature Granulocytes: 0 %
Lymphocytes Relative: 27 %
Lymphs Abs: 1 10*3/uL (ref 0.7–4.0)
MCH: 31.7 pg (ref 26.0–34.0)
MCHC: 34.5 g/dL (ref 30.0–36.0)
MCV: 92 fL (ref 80.0–100.0)
Monocytes Absolute: 0.4 10*3/uL (ref 0.1–1.0)
Monocytes Relative: 9 %
Neutro Abs: 2.2 10*3/uL (ref 1.7–7.7)
Neutrophils Relative %: 57 %
Platelets: 186 10*3/uL (ref 150–400)
RBC: 4.98 MIL/uL (ref 4.22–5.81)
RDW: 13.6 % (ref 11.5–15.5)
WBC: 3.8 10*3/uL — ABNORMAL LOW (ref 4.0–10.5)
nRBC: 0 % (ref 0.0–0.2)

## 2022-07-02 LAB — CMP (CANCER CENTER ONLY)
ALT: 30 U/L (ref 0–44)
AST: 27 U/L (ref 15–41)
Albumin: 4.1 g/dL (ref 3.5–5.0)
Alkaline Phosphatase: 131 U/L — ABNORMAL HIGH (ref 38–126)
Anion gap: 9 (ref 5–15)
BUN: 16 mg/dL (ref 6–20)
CO2: 25 mmol/L (ref 22–32)
Calcium: 9 mg/dL (ref 8.9–10.3)
Chloride: 103 mmol/L (ref 98–111)
Creatinine: 1.53 mg/dL — ABNORMAL HIGH (ref 0.61–1.24)
GFR, Estimated: 55 mL/min — ABNORMAL LOW (ref >60)
Glucose, Bld: 105 mg/dL — ABNORMAL HIGH (ref 70–99)
Potassium: 3.1 mmol/L — ABNORMAL LOW (ref 3.5–5.1)
Sodium: 137 mmol/L (ref 135–145)
Total Bilirubin: 0.5 mg/dL (ref 0.3–1.2)
Total Protein: 7 g/dL (ref 6.5–8.1)

## 2022-07-02 LAB — IRON AND TIBC
Iron: 56 ug/dL (ref 45–182)
Saturation Ratios: 16 % — ABNORMAL LOW (ref 17.9–39.5)
TIBC: 350 ug/dL (ref 250–450)
UIBC: 294 ug/dL

## 2022-07-02 LAB — FOLATE: Folate: 9 ng/mL (ref >5.9)

## 2022-07-02 LAB — TSH: TSH: 10.336 u[IU]/mL — ABNORMAL HIGH (ref 0.350–4.500)

## 2022-07-02 LAB — FERRITIN: Ferritin: 76 ng/mL (ref 24–336)

## 2022-07-02 LAB — VITAMIN B12: Vitamin B-12: 258 pg/mL (ref 180–914)

## 2022-07-02 MED ORDER — DARATUMUMAB-HYALURONIDASE-FIHJ 1800-30000 MG-UT/15ML ~~LOC~~ SOLN
1800.0000 mg | Freq: Once | SUBCUTANEOUS | Status: AC
  Administered 2022-07-02: 1800 mg via SUBCUTANEOUS
  Filled 2022-07-02: qty 15

## 2022-07-02 NOTE — Patient Instructions (Signed)
[facility] [facility] [place] [facility]  Discharge Instructions: Thank you for choosing [facility] to provide your oncology and hematology care.  If you have a lab appointment with the Cancer Center, please go directly to the Cancer Center and check in at the registration area.  Wear comfortable clothing and clothing appropriate for easy access to any Portacath or PICC line.   We strive to give you quality time with your provider. You may need to reschedule your appointment if you arrive late (15 or more minutes).  Arriving late affects you and other patients whose appointments are after yours.  Also, if you miss three or more appointments without notifying the office, you may be dismissed from the clinic at the provider's discretion.      For prescription refill requests, have your pharmacy contact our office and allow 72 hours for refills to be completed.    Today you received the following chemotherapy and/or immunotherapy agents Darzalex      To help prevent nausea and vomiting after your treatment, we encourage you to take your nausea medication as directed.  BELOW ARE SYMPTOMS THAT SHOULD BE REPORTED IMMEDIATELY: *FEVER GREATER THAN 100.4 F (38 C) OR HIGHER *CHILLS OR SWEATING *NAUSEA AND VOMITING THAT IS NOT CONTROLLED WITH YOUR NAUSEA MEDICATION *UNUSUAL SHORTNESS OF BREATH *UNUSUAL BRUISING OR BLEEDING *URINARY PROBLEMS (pain or burning when urinating, or frequent urination) *BOWEL PROBLEMS (unusual diarrhea, constipation, pain near the anus) TENDERNESS IN MOUTH AND THROAT WITH OR WITHOUT PRESENCE OF ULCERS (sore throat, sores in mouth, or a toothache) UNUSUAL RASH, SWELLING OR PAIN  UNUSUAL VAGINAL DISCHARGE OR ITCHING   Items with * indicate a potential emergency and should be followed up as soon as possible or go to the Emergency Department if any problems should occur.  Please show the CHEMOTHERAPY ALERT CARD or IMMUNOTHERAPY ALERT CARD at check-in to  the Emergency Department and triage nurse.  Should you have questions after your visit or need to cancel or reschedule your appointment, please contact [place] [facility] [place] [facility]  [phone] and follow the prompts.  Office hours are 8:00 a.m. to 4:30 p.m. Monday - Friday. Please note that voicemails left after 4:00 p.m. may not be returned until the following business day.  We are closed weekends and major holidays. You have access to a nurse at all times for urgent questions. Please call the main number to the clinic [phone] and follow the prompts.  For any non-urgent questions, you may also contact your provider using MyChart. We now offer e-Visits for anyone 18 and older to request care online for non-urgent symptoms. For details visit [email or web address].   Also download the MyChart app! Go to the app store, search "MyChart", open the app, select [place], and log in with your MyChart username and password.      

## 2022-07-03 ENCOUNTER — Encounter: Payer: Self-pay | Admitting: Family Medicine

## 2022-07-03 LAB — KAPPA/LAMBDA LIGHT CHAINS
Kappa free light chain: 369.2 mg/L — ABNORMAL HIGH (ref 3.3–19.4)
Kappa, lambda light chain ratio: 32.96 — ABNORMAL HIGH (ref 0.26–1.65)
Lambda free light chains: 11.2 mg/L (ref 5.7–26.3)

## 2022-07-04 ENCOUNTER — Other Ambulatory Visit: Payer: Self-pay | Admitting: Family Medicine

## 2022-07-04 DIAGNOSIS — G8929 Other chronic pain: Secondary | ICD-10-CM

## 2022-07-04 DIAGNOSIS — M1712 Unilateral primary osteoarthritis, left knee: Secondary | ICD-10-CM

## 2022-07-04 DIAGNOSIS — M87052 Idiopathic aseptic necrosis of left femur: Secondary | ICD-10-CM

## 2022-07-04 DIAGNOSIS — M8700 Idiopathic aseptic necrosis of unspecified bone: Secondary | ICD-10-CM

## 2022-07-04 NOTE — Telephone Encounter (Signed)
I spoke to Jenny Reichmann on the phone, and I am not sure about the bone infarcts and how much they relate to his knee pain.  I am going to involve Orthopedic Surgery for their opinion.

## 2022-07-05 ENCOUNTER — Encounter: Payer: Self-pay | Admitting: *Deleted

## 2022-07-08 LAB — MULTIPLE MYELOMA PANEL, SERUM
Albumin SerPl Elph-Mcnc: 3.5 g/dL (ref 2.9–4.4)
Albumin/Glob SerPl: 1.4 (ref 0.7–1.7)
Alpha 1: 0.2 g/dL (ref 0.0–0.4)
Alpha2 Glob SerPl Elph-Mcnc: 0.7 g/dL (ref 0.4–1.0)
B-Globulin SerPl Elph-Mcnc: 1 g/dL (ref 0.7–1.3)
Gamma Glob SerPl Elph-Mcnc: 0.6 g/dL (ref 0.4–1.8)
Globulin, Total: 2.6 g/dL (ref 2.2–3.9)
IgA: 91 mg/dL (ref 90–386)
IgG (Immunoglobin G), Serum: 760 mg/dL (ref 603–1613)
IgM (Immunoglobulin M), Srm: 47 mg/dL (ref 20–172)
M Protein SerPl Elph-Mcnc: 0.2 g/dL — ABNORMAL HIGH
Total Protein ELP: 6.1 g/dL (ref 6.0–8.5)

## 2022-07-09 ENCOUNTER — Inpatient Hospital Stay: Payer: BC Managed Care – PPO

## 2022-07-09 ENCOUNTER — Inpatient Hospital Stay (HOSPITAL_BASED_OUTPATIENT_CLINIC_OR_DEPARTMENT_OTHER): Payer: BC Managed Care – PPO | Admitting: Oncology

## 2022-07-09 ENCOUNTER — Encounter: Payer: Self-pay | Admitting: Oncology

## 2022-07-09 VITALS — BP 154/109 | HR 76 | Temp 97.6°F | Resp 18 | Wt 150.7 lb

## 2022-07-09 DIAGNOSIS — C9001 Multiple myeloma in remission: Secondary | ICD-10-CM

## 2022-07-09 DIAGNOSIS — Z5111 Encounter for antineoplastic chemotherapy: Secondary | ICD-10-CM

## 2022-07-09 DIAGNOSIS — R7989 Other specified abnormal findings of blood chemistry: Secondary | ICD-10-CM

## 2022-07-09 DIAGNOSIS — Z79891 Long term (current) use of opiate analgesic: Secondary | ICD-10-CM | POA: Diagnosis not present

## 2022-07-09 DIAGNOSIS — Z5112 Encounter for antineoplastic immunotherapy: Secondary | ICD-10-CM | POA: Diagnosis not present

## 2022-07-09 DIAGNOSIS — G620 Drug-induced polyneuropathy: Secondary | ICD-10-CM | POA: Diagnosis not present

## 2022-07-09 DIAGNOSIS — C9002 Multiple myeloma in relapse: Secondary | ICD-10-CM | POA: Diagnosis not present

## 2022-07-09 DIAGNOSIS — Z7961 Long term (current) use of immunomodulator: Secondary | ICD-10-CM | POA: Diagnosis not present

## 2022-07-09 DIAGNOSIS — Z9484 Stem cells transplant status: Secondary | ICD-10-CM | POA: Diagnosis not present

## 2022-07-09 DIAGNOSIS — G4700 Insomnia, unspecified: Secondary | ICD-10-CM | POA: Diagnosis not present

## 2022-07-09 DIAGNOSIS — G8929 Other chronic pain: Secondary | ICD-10-CM | POA: Diagnosis not present

## 2022-07-09 DIAGNOSIS — Z79899 Other long term (current) drug therapy: Secondary | ICD-10-CM

## 2022-07-09 DIAGNOSIS — F5104 Psychophysiologic insomnia: Secondary | ICD-10-CM | POA: Diagnosis not present

## 2022-07-09 DIAGNOSIS — Z9481 Bone marrow transplant status: Secondary | ICD-10-CM

## 2022-07-09 DIAGNOSIS — T451X5A Adverse effect of antineoplastic and immunosuppressive drugs, initial encounter: Secondary | ICD-10-CM | POA: Diagnosis not present

## 2022-07-09 DIAGNOSIS — Z299 Encounter for prophylactic measures, unspecified: Secondary | ICD-10-CM

## 2022-07-09 LAB — CMP (CANCER CENTER ONLY)
ALT: 23 U/L (ref 0–44)
AST: 24 U/L (ref 15–41)
Albumin: 3.9 g/dL (ref 3.5–5.0)
Alkaline Phosphatase: 121 U/L (ref 38–126)
Anion gap: 6 (ref 5–15)
BUN: 14 mg/dL (ref 6–20)
CO2: 26 mmol/L (ref 22–32)
Calcium: 8.9 mg/dL (ref 8.9–10.3)
Chloride: 106 mmol/L (ref 98–111)
Creatinine: 1.32 mg/dL — ABNORMAL HIGH (ref 0.61–1.24)
GFR, Estimated: >60 mL/min (ref >60)
Glucose, Bld: 59 mg/dL — ABNORMAL LOW (ref 70–99)
Potassium: 3.3 mmol/L — ABNORMAL LOW (ref 3.5–5.1)
Sodium: 138 mmol/L (ref 135–145)
Total Bilirubin: 0.4 mg/dL (ref 0.3–1.2)
Total Protein: 6.4 g/dL — ABNORMAL LOW (ref 6.5–8.1)

## 2022-07-09 LAB — CBC WITH DIFFERENTIAL/PLATELET
Abs Immature Granulocytes: 0.01 10*3/uL (ref 0.00–0.07)
Basophils Absolute: 0 10*3/uL (ref 0.0–0.1)
Basophils Relative: 1 %
Eosinophils Absolute: 0.2 10*3/uL (ref 0.0–0.5)
Eosinophils Relative: 5 %
HCT: 43.6 % (ref 39.0–52.0)
Hemoglobin: 15.2 g/dL (ref 13.0–17.0)
Immature Granulocytes: 0 %
Lymphocytes Relative: 28 %
Lymphs Abs: 1.1 10*3/uL (ref 0.7–4.0)
MCH: 32.1 pg (ref 26.0–34.0)
MCHC: 34.9 g/dL (ref 30.0–36.0)
MCV: 92.2 fL (ref 80.0–100.0)
Monocytes Absolute: 0.4 10*3/uL (ref 0.1–1.0)
Monocytes Relative: 10 %
Neutro Abs: 2.2 10*3/uL (ref 1.7–7.7)
Neutrophils Relative %: 56 %
Platelets: 186 10*3/uL (ref 150–400)
RBC: 4.73 MIL/uL (ref 4.22–5.81)
RDW: 13.3 % (ref 11.5–15.5)
WBC: 4 10*3/uL (ref 4.0–10.5)
nRBC: 0 % (ref 0.0–0.2)

## 2022-07-09 MED ORDER — DARATUMUMAB-HYALURONIDASE-FIHJ 1800-30000 MG-UT/15ML ~~LOC~~ SOLN
1800.0000 mg | Freq: Once | SUBCUTANEOUS | Status: AC
  Administered 2022-07-09: 1800 mg via SUBCUTANEOUS
  Filled 2022-07-09: qty 15

## 2022-07-09 MED ORDER — CYANOCOBALAMIN 1000 MCG/ML IJ SOLN
1000.0000 ug | INTRAMUSCULAR | Status: DC
  Administered 2022-07-09: 1000 ug via INTRAMUSCULAR
  Filled 2022-07-09: qty 1

## 2022-07-09 NOTE — Progress Notes (Signed)
No concerns today 

## 2022-07-09 NOTE — Progress Notes (Signed)
Hematology/Oncology Consult note [facility]  Telephone:([phone] [phone]  Patient Care Team: Pleas Koch, NP as PCP - General (Internal Medicine) Vella Redhead, MD as PCP - Hematology/Oncology Sindy Guadeloupe, MD as Consulting Physician (Hematology and Oncology)   Name of the patient: James House  [id number]  [birth year]   Date of visit: 07/09/22  Diagnosis- kappa light chain multiple myeloma in relapse    Chief complaint/ Reason for visit-on treatment assessment prior to cycle 2-day 8 of Darzalex  Heme/Onc history: Patient is a 52- year-old male with a history of kappa light chain myeloma that was treated by [name].  History is as follows:   1.  Patient presented with renal insufficiency with a creatinine of 2.4 in April 2018.  Kidney biopsy showed myeloma cast nephropathy.  Kappa light chain was 3004 and lambda 0.22 with a kappa lambda ratio of 13,000 655.  Skeletal survey showed multiple calvarial and marrow lesions.  Bone marrow biopsy in May 2018 showed 43% plasma cells.  He received CyBorD for cycle 1 in May 2018 and was subsequently switched to RVD.  He received 4 cycles followed by a repeat bone marrow biopsy in August 2018 which showed no increase in plasma cells.   2.  He underwent autologous stem cell transplantation on 01/30/2017.  He was started on Revlimid maintenance 10 mg daily in January 2019.   3.  Labs from January February and March 2019 done at [facility] showed no M spike, normal kappa lambda ratio of 1.06.  He was last seen by them in April 2019.   4. Following that patient was admitted to [facility] for healthcare associated pneumonia and was recently discharged.  He wished to transfer his care to [place] at this time.  He was on maintenance Revlimid 5 mg which was then reduced to 2.5 mg.  Follows up with pain clinic for chemo-induced peripheral neuropathy with Dr. Dossie Arbour.  His urine tested positive for [facility] and therefore  patient was discharged from pain clinic   5.  Noted to have gradually increasing free kappa light chainFrom 15 in 20 21-39.5 with a ratio of 3.09.  He was seen by [facility] again and underwent a repeat bone marrow biopsy which did not show any evidence of clonal plasma cells.  PET CT scan also did not show any new active lytic lesions in May 2023.Marland Kitchen  Revlimid dose was increased to 15 mg 3 weeks on 1 week off but kappa light chains show continuing to increase   6.  PET CT scan in December 2023 did not show any evidence of active myeloma.  Patient underwent repeat bone marrow biopsy at [facility] which showed 6% plasma cellsConsistent with persistent plasma cell neoplasm.  74 to 86% of isolated plasma cells show loss of T p53 and monosomy 13.  Patient was seen by [name] at [facility] and was recommended Darzalex Pomalyst dexamethasone regimen     Interval history-patient mainly complains about inability to sleep at night despite working during the day.  He feels that symptoms are mainly driven by anxiety and concerned about how his myeloma will respond.  ECOG PS- 1 Pain scale- 3 Opioid associated constipation- no  Review of systems- Review of Systems  Constitutional:  Positive for malaise/fatigue. Negative for chills, fever and weight loss.  HENT:  Negative for congestion, ear discharge and nosebleeds.   Eyes:  Negative for blurred vision.  Respiratory:  Negative for cough, hemoptysis, sputum production, shortness of  breath and wheezing.   Cardiovascular:  Negative for chest pain, palpitations, orthopnea and claudication.  Gastrointestinal:  Negative for abdominal pain, blood in stool, constipation, diarrhea, heartburn, melena, nausea and vomiting.  Genitourinary:  Negative for dysuria, flank pain, frequency, hematuria and urgency.  Musculoskeletal:  Negative for back pain, joint pain and myalgias.  Skin:  Negative for rash.  Neurological:  Positive for sensory change (Peripheral neuropathy). Negative for  dizziness, tingling, focal weakness, seizures, weakness and headaches.  Endo/Heme/Allergies:  Does not bruise/bleed easily.  Psychiatric/Behavioral:  Negative for depression and suicidal ideas. The patient has insomnia.       No Known Allergies   Past Medical History:  Diagnosis Date   CKD (chronic kidney disease) stage 3, GFR 30-59 ml/min (HCC)    Hypertension    Multiple myeloma ([place]) 08/11/2017   Neuropathy    Pneumonia    Severe sepsis ([employer]) 08/11/2017   Shingles 08/03/2019     Past Surgical History:  Procedure Laterality Date   ABDOMINAL SURGERY     COLONOSCOPY WITH PROPOFOL N/A 05/25/2021   Procedure: COLONOSCOPY WITH PROPOFOL;  Surgeon: Lin Landsman, MD;  Location: [facility] ENDOSCOPY;  Service: Gastroenterology;  Laterality: N/A;   JOINT REPLACEMENT     right knee   KNEE SURGERY Left     Social History   Socioeconomic History   Marital status: Single    Spouse name: [name]   Number of children: 3   Years of education: Not on file   Highest education level: Not on file  Occupational History   Not on file  Tobacco Use   Smoking status: Former   Smokeless tobacco: Current    Types: Snuff  Vaping Use   Vaping Use: Never used  Substance and Sexual Activity   Alcohol use: No   Drug use: No   Sexual activity: Yes  Other Topics Concern   Not on file  Social History Narrative   Live in private residence with spouse and mother in law   Social Determinants of Health   Financial Resource Strain: [place]  (08/12/2017)   Overall Financial Resource Strain (CARDIA)    Difficulty of Paying Living Expenses: Not hard at all  Food Insecurity: No Food Insecurity (08/12/2017)   Hunger Vital Sign    Worried About Running Out of Food in the Last Year: Never true    [place] in the Last Year: Never true  Transportation Needs: No Transportation Needs (08/12/2017)   PRAPARE - Hydrologist (Medical): No    Lack of Transportation  (Non-Medical): No  Physical Activity: Sufficiently Active (08/12/2017)   Exercise Vital Sign    Days of Exercise per Week: 7 days    Minutes of Exercise per Session: 30 min  Stress: No Stress Concern Present (08/12/2017)   [place]    Feeling of Stress : Only a little  Social Connections: Somewhat Isolated (08/12/2017)   Social Connection and Isolation Panel [NHANES]    Frequency of Communication with Friends and Family: Once a week    Frequency of Social Gatherings with Friends and Family: Once a week    Attends Religious Services: More than 4 times per year    Active Member of [employer] or Organizations: No    Attends Archivist Meetings: Never    Marital Status: Married  Human resources officer Violence: Not At Risk (08/12/2017)   Humiliation, Afraid, Rape, and Kick questionnaire  Fear of Current or Ex-Partner: No    Emotionally Abused: No    Physically Abused: No    Sexually Abused: No    Family History  Problem Relation Age of Onset   Cancer Mother 31       Pancreatic   COPD Mother    Diabetes Mother    Hyperlipidemia Mother    Hypertension Mother    Cancer Maternal Uncle        pancreatic   Cancer Maternal Grandmother 24       colon   COPD Maternal Grandmother    Diabetes Maternal Grandmother    Hypertension Maternal Grandmother      Current Outpatient Medications:    acyclovir (ZOVIRAX) 400 MG tablet, Take 1 tablet (400 mg total) by mouth 2 (two) times daily., Disp: 60 tablet, Rfl: 11   amLODipine (NORVASC) 10 MG tablet, Take 1 tablet (10 mg total) by mouth daily. For blood pressure., Disp: 90 tablet, Rfl: 0   aspirin EC 81 MG tablet, Take 81 mg by mouth daily., Disp: , Rfl:    baclofen (LIORESAL) 10 MG tablet, Take 1 tablet (10 mg total) by mouth 4 (four) times daily as needed for muscle spasms., Disp: 360 tablet, Rfl: 0   celecoxib (CELEBREX) 200 MG capsule, Take 1 capsule (200 mg total) by mouth  daily., Disp: 30 capsule, Rfl: 2   cyanocobalamin (VITAMIN B12) 1000 MCG/ML injection, Inject 1 mL (1,000 mcg total) into the muscle every 30 (thirty) days., Disp: 1 mL, Rfl: 11   dexamethasone (DECADRON) 4 MG tablet, Take 5 tablets (20 mg total) by mouth 2 (two) times a week. Take first dose 1 hour prior to reporting to clinic for daratumumab. Take 2nd dose the day after daratumumab., Disp: 40 tablet, Rfl: 1   DULoxetine (CYMBALTA) 30 MG capsule, Take 1 capsule (30 mg total) by mouth daily., Disp: 30 capsule, Rfl: 11   lidocaine-prilocaine (EMLA) cream, Apply to affected area once, Disp: 30 g, Rfl: 3   montelukast (SINGULAIR) 10 MG tablet, Take 1 tablet in the evening 1 day before your treatment. Day of treatment take 1 tablet in the morning for the first 2 treatments of Daratumumab, Disp: 4 tablet, Rfl: 0   OLANZapine (ZYPREXA) 10 MG tablet, Take 1 tablet (10 mg total) by mouth at bedtime. To help with nausea prevention., Disp: 30 tablet, Rfl: 1   olmesartan (BENICAR) 20 MG tablet, Take 1 tablet (20 mg total) by mouth daily. For blood pressure., Disp: 90 tablet, Rfl: 3   ondansetron (ZOFRAN) 8 MG tablet, Take 1 tablet (8 mg total) by mouth every 8 (eight) hours as needed for nausea or vomiting., Disp: 30 tablet, Rfl: 1   Oxycodone HCl 10 MG TABS, Take 1 tablet (10 mg total) by mouth every 8 (eight) hours as needed., Disp: 90 tablet, Rfl: 0   pomalidomide (POMALYST) 3 MG capsule, Take 1 capsule (3 mg total) by mouth daily. 1 tablet daily for 21 days and then 7 days off.  REMS: [id number] obtained on 06/23/22, Disp: 21 capsule, Rfl: 0   potassium chloride SA (KLOR-CON M) 20 MEQ tablet, Take 1 tablet (20 mEq total) by mouth daily., Disp: 21 tablet, Rfl: 0   pregabalin (LYRICA) 150 MG capsule, Take 1 capsule (150 mg total) by mouth 3 (three) times daily as needed (for pain)., Disp: 270 capsule, Rfl: 0   prochlorperazine (COMPAZINE) 10 MG tablet, Take 1 tablet (10 mg total) by mouth every 6 (six) hours as  needed for  nausea or vomiting., Disp: 60 tablet, Rfl: 0   prochlorperazine (COMPAZINE) 10 MG tablet, Take 1 tablet (10 mg total) by mouth every 6 (six) hours as needed for nausea or vomiting., Disp: 30 tablet, Rfl: 1   sertraline (ZOLOFT) 25 MG tablet, Take 1 tablet (25 mg total) by mouth daily., Disp: 30 tablet, Rfl: 11   SUMAtriptan (IMITREX) 50 MG tablet, Take 1 tablet by mouth at migraine onset. May repeat in 2 hours if headache persists or recurs., Disp: 10 tablet, Rfl: 0   Syringe/Needle, Disp, (SYRINGE 3CC/25GX1") 25G X 1" 3 ML MISC, 1 Syringe by Does not apply route every 30 (thirty) days., Disp: 12 each, Rfl: 0   topiramate (TOPAMAX) 50 MG tablet, Take 1 tablet (50 mg total) by mouth at bedtime. For headache prevention., Disp: 90 tablet, Rfl: 2 No current facility-administered medications for this visit.  Facility-Administered Medications Ordered in Other Visits:    cyanocobalamin (VITAMIN B12) injection 1,000 mcg, 1,000 mcg, Intramuscular, Q30 days, Sindy Guadeloupe, MD, 1,000 mcg at 07/09/22 0957  Physical exam:  Vitals:   07/09/22 0837 07/09/22 0905  BP: (!) 153/106 (!) 154/109  Pulse: 76   Resp: 18   Temp: 97.6 F (36.4 C)   TempSrc: Tympanic   SpO2: 100%   Weight: 150 lb 11.2 oz (68.4 kg)    Physical Exam Cardiovascular:     Rate and Rhythm: Normal rate and regular rhythm.     Heart sounds: Normal heart sounds.  Pulmonary:     Effort: Pulmonary effort is normal.     Breath sounds: Normal breath sounds.  Skin:    General: Skin is warm and dry.  Neurological:     Mental Status: He is alert and oriented to person, place, and time.         Latest Ref Rng & Units 07/09/2022    8:28 AM  CMP  Glucose 70 - 99 mg/dL 59   BUN 6 - 20 mg/dL 14   Creatinine 0.61 - 1.24 mg/dL 1.32   Sodium 135 - 145 mmol/L 138   Potassium 3.5 - 5.1 mmol/L 3.3   Chloride 98 - 111 mmol/L 106   CO2 22 - 32 mmol/L 26   Calcium 8.9 - 10.3 mg/dL 8.9   Total Protein 6.5 - 8.1 g/dL 6.4   Total  Bilirubin 0.3 - 1.2 mg/dL 0.4   Alkaline Phos 38 - 126 U/L 121   AST 15 - 41 U/L 24   ALT 0 - 44 U/L 23       Latest Ref Rng & Units 07/09/2022    8:28 AM  CBC  WBC 4.0 - 10.5 K/uL 4.0   Hemoglobin 13.0 - 17.0 g/dL 15.2   Hematocrit 39.0 - 52.0 % 43.6   Platelets 150 - 400 K/uL 186     No images are attached to the encounter.  MR Knee Left  Wo Contrast  Result Date: 07/01/2022 CLINICAL DATA:  Knee pain with swelling and popping for 3 months. History of partial knee replacement. Recurrent multiple myeloma. Evaluate for neoplasm of bone. EXAM: MRI OF THE LEFT KNEE WITHOUT CONTRAST TECHNIQUE: Multiplanar, multisequence MR imaging of the knee was performed. No intravenous contrast was administered. Protocol was adjusted to minimize artifact from the patient's lateral compartment arthroplasty. COMPARISON:  Radiographs 06/12/2022 and 08/25/2017. PET-CT 05/10/2022. FINDINGS: MENISCI Medial meniscus:  Intact with normal morphology. Lateral meniscus:  Status post lateral compartment arthroplasty. LIGAMENTS Cruciates: Partially obscured by artifact from the prior arthroplasty. Grossly intact.  Collaterals: The medial collateral ligament appears normal. The lateral collateral ligament complex is grossly intact although partly obscured by artifact. CARTILAGE Patellofemoral: Mild chondral thinning without focal chondral defect. Medial:  Mild chondral thinning without focal defect. Lateral:  Status post lateral compartment arthroplasty. MISCELLANEOUS Joint:  Small joint effusion.  Mild synovial/capsular thickening. Popliteal Fossa: The popliteus muscle and tendon are intact. Tiny Baker's cyst. Extensor Mechanism: Patellar tendinosis or postsurgical change with mild overlying susceptibility artifact. No acute findings. There is scarring in Hoffa's fat. The quadriceps tendon appears unremarkable. Bones: There are small bone infarcts laterally in the distal femoral metaphysis and centrally in the proximal tibial  metaphysis. No findings suspicious for multiple myeloma. No evidence of acute fracture or dislocation. Other: No other significant periarticular soft tissue findings. IMPRESSION: 1. No evidence of multiple myeloma or acute osseous findings. Small bone infarcts in the distal femur and proximal tibia. 2. Small knee joint effusion with mild synovial/capsular thickening. 3. Previous lateral compartment arthroplasty. Mild medial and patellofemoral compartment chondral thinning without focal chondral defect. 4. Grossly intact medial meniscus, cruciate and collateral ligaments. Electronically Signed   By: Richardean Sale M.D.   On: 07/01/2022 13:55   DG Knee Complete 4 Views Left  Result Date: 06/12/2022 CLINICAL DATA:  Fall in January. History of partial knee replacement. EXAM: LEFT KNEE - COMPLETE 4+ VIEW COMPARISON:  Knee radiographs 08/25/2017 FINDINGS: The patient is status post partial knee arthroplasty of the lateral compartment. Hardware alignment is within expected limits, without evidence of complication. Knee alignment is normal. There is no evidence of acute fracture or dislocation. The medial compartment joint space is preserved, without significant osteophytosis. The soft tissues are unremarkable. There is no effusion. IMPRESSION: No acute fracture or dislocation. Electronically Signed   By: Valetta Mole M.D.   On: 06/12/2022 15:52     Assessment and plan- Patient is a 52 y.o. male with history of kappa light chain multiple myeloma currently in relapse.  He is here for on treatment assessment prior to cycle 2-day 8 of Darzalex  Counts okay to proceed with cycle 2-day 8 of Darzalex today.  I will see him back in 2 weeks for cycle 2-day 22.  He did start Pomalyst 3 mg last week which she will take 3 weeks on and 1 week off.  Continue acyclovir prophylaxis along with low-dose aspirin and weekly Decadron 20 mg.  Chemo-induced peripheral neuropathy: Continue as needed oxycodone and Lyrica  Insomnia:  I have asked him to increase his dose of Seroquel 25 to 50 mg and I will also have NP Vonna Kotyk [employer]orders follow-up with him later this week for his symptoms.   Visit Diagnosis 1. Encounter for antineoplastic chemotherapy   2. Multiple myeloma in relapse ([place])   3. High risk medication use   4. Insomnia, unspecified type      Dr. Randa Evens, MD, MPH [facility] [id number] 07/09/2022 1:08 PM

## 2022-07-09 NOTE — Progress Notes (Signed)
Verified pt took home premedications prior to treatment.

## 2022-07-09 NOTE — Patient Instructions (Signed)
[place] [facility] [place] [facility]  Discharge Instructions: Thank you for choosing [facility] to provide your oncology and hematology care.  If you have a lab appointment with the Cancer Center, please go directly to the Cancer Center and check in at the registration area.  Wear comfortable clothing and clothing appropriate for easy access to any Portacath or PICC line.   We strive to give you quality time with your provider. You may need to reschedule your appointment if you arrive late (15 or more minutes).  Arriving late affects you and other patients whose appointments are after yours.  Also, if you miss three or more appointments without notifying the office, you may be dismissed from the clinic at the provider's discretion.      For prescription refill requests, have your pharmacy contact our office and allow 72 hours for refills to be completed.    Today you received the following chemotherapy and/or immunotherapy agents Darzalex Faspro      To help prevent nausea and vomiting after your treatment, we encourage you to take your nausea medication as directed.  BELOW ARE SYMPTOMS THAT SHOULD BE REPORTED IMMEDIATELY: *FEVER GREATER THAN 100.4 F (38 C) OR HIGHER *CHILLS OR SWEATING *NAUSEA AND VOMITING THAT IS NOT CONTROLLED WITH YOUR NAUSEA MEDICATION *UNUSUAL SHORTNESS OF BREATH *UNUSUAL BRUISING OR BLEEDING *URINARY PROBLEMS (pain or burning when urinating, or frequent urination) *BOWEL PROBLEMS (unusual diarrhea, constipation, pain near the anus) TENDERNESS IN MOUTH AND THROAT WITH OR WITHOUT PRESENCE OF ULCERS (sore throat, sores in mouth, or a toothache) UNUSUAL RASH, SWELLING OR PAIN  UNUSUAL VAGINAL DISCHARGE OR ITCHING   Items with * indicate a potential emergency and should be followed up as soon as possible or go to the Emergency Department if any problems should occur.  Please show the CHEMOTHERAPY ALERT CARD or IMMUNOTHERAPY ALERT CARD at  check-in to the Emergency Department and triage nurse.  Should you have questions after your visit or need to cancel or reschedule your appointment, please contact [place] [facility] [place] [facility]  [phone] and follow the prompts.  Office hours are 8:00 a.m. to 4:30 p.m. Monday - Friday. Please note that voicemails left after 4:00 p.m. may not be returned until the following business day.  We are closed weekends and major holidays. You have access to a nurse at all times for urgent questions. Please call the main number to the clinic [phone] and follow the prompts.  For any non-urgent questions, you may also contact your provider using MyChart. We now offer e-Visits for anyone 18 and older to request care online for non-urgent symptoms. For details visit [email or web address].   Also download the MyChart app! Go to the app store, search "MyChart", open the app, select [place], and log in with your MyChart username and password.    

## 2022-07-11 DIAGNOSIS — M7652 Patellar tendinitis, left knee: Secondary | ICD-10-CM | POA: Diagnosis not present

## 2022-07-11 DIAGNOSIS — Z96652 Presence of left artificial knee joint: Secondary | ICD-10-CM | POA: Diagnosis not present

## 2022-07-11 DIAGNOSIS — M25562 Pain in left knee: Secondary | ICD-10-CM | POA: Diagnosis not present

## 2022-07-12 ENCOUNTER — Other Ambulatory Visit: Payer: BC Managed Care – PPO

## 2022-07-12 ENCOUNTER — Inpatient Hospital Stay: Payer: BC Managed Care – PPO | Admitting: Hospice and Palliative Medicine

## 2022-07-15 ENCOUNTER — Inpatient Hospital Stay (HOSPITAL_BASED_OUTPATIENT_CLINIC_OR_DEPARTMENT_OTHER): Payer: BC Managed Care – PPO | Admitting: Hospice and Palliative Medicine

## 2022-07-15 DIAGNOSIS — C9002 Multiple myeloma in relapse: Secondary | ICD-10-CM

## 2022-07-15 NOTE — Progress Notes (Signed)
Unable to reach patient.  Voicemail left. °

## 2022-07-16 ENCOUNTER — Inpatient Hospital Stay (HOSPITAL_BASED_OUTPATIENT_CLINIC_OR_DEPARTMENT_OTHER): Payer: BC Managed Care – PPO | Admitting: Hospice and Palliative Medicine

## 2022-07-16 ENCOUNTER — Inpatient Hospital Stay: Payer: BC Managed Care – PPO

## 2022-07-16 ENCOUNTER — Ambulatory Visit: Payer: BC Managed Care – PPO | Admitting: Oncology

## 2022-07-16 ENCOUNTER — Other Ambulatory Visit: Payer: Self-pay | Admitting: Primary Care

## 2022-07-16 ENCOUNTER — Other Ambulatory Visit: Payer: Self-pay | Admitting: *Deleted

## 2022-07-16 ENCOUNTER — Other Ambulatory Visit: Payer: Self-pay

## 2022-07-16 VITALS — BP 152/98 | HR 72 | Temp 96.5°F | Resp 17 | Wt 154.0 lb

## 2022-07-16 DIAGNOSIS — Z79899 Other long term (current) drug therapy: Secondary | ICD-10-CM | POA: Diagnosis not present

## 2022-07-16 DIAGNOSIS — T451X5A Adverse effect of antineoplastic and immunosuppressive drugs, initial encounter: Secondary | ICD-10-CM | POA: Diagnosis not present

## 2022-07-16 DIAGNOSIS — Z5112 Encounter for antineoplastic immunotherapy: Secondary | ICD-10-CM | POA: Diagnosis not present

## 2022-07-16 DIAGNOSIS — F5104 Psychophysiologic insomnia: Secondary | ICD-10-CM | POA: Diagnosis not present

## 2022-07-16 DIAGNOSIS — C9002 Multiple myeloma in relapse: Secondary | ICD-10-CM

## 2022-07-16 DIAGNOSIS — M792 Neuralgia and neuritis, unspecified: Secondary | ICD-10-CM

## 2022-07-16 DIAGNOSIS — G4700 Insomnia, unspecified: Secondary | ICD-10-CM | POA: Diagnosis not present

## 2022-07-16 DIAGNOSIS — Z515 Encounter for palliative care: Secondary | ICD-10-CM | POA: Diagnosis not present

## 2022-07-16 DIAGNOSIS — Z9484 Stem cells transplant status: Secondary | ICD-10-CM | POA: Diagnosis not present

## 2022-07-16 DIAGNOSIS — Z79891 Long term (current) use of opiate analgesic: Secondary | ICD-10-CM | POA: Diagnosis not present

## 2022-07-16 DIAGNOSIS — Z7961 Long term (current) use of immunomodulator: Secondary | ICD-10-CM | POA: Diagnosis not present

## 2022-07-16 DIAGNOSIS — G620 Drug-induced polyneuropathy: Secondary | ICD-10-CM | POA: Diagnosis not present

## 2022-07-16 DIAGNOSIS — G8929 Other chronic pain: Secondary | ICD-10-CM | POA: Diagnosis not present

## 2022-07-16 LAB — CMP (CANCER CENTER ONLY)
ALT: 36 U/L (ref 0–44)
AST: 42 U/L — ABNORMAL HIGH (ref 15–41)
Albumin: 3.8 g/dL (ref 3.5–5.0)
Alkaline Phosphatase: 113 U/L (ref 38–126)
Anion gap: 6 (ref 5–15)
BUN: 11 mg/dL (ref 6–20)
CO2: 26 mmol/L (ref 22–32)
Calcium: 9 mg/dL (ref 8.9–10.3)
Chloride: 105 mmol/L (ref 98–111)
Creatinine: 1.35 mg/dL — ABNORMAL HIGH (ref 0.61–1.24)
GFR, Estimated: >60 mL/min (ref >60)
Glucose, Bld: 107 mg/dL — ABNORMAL HIGH (ref 70–99)
Potassium: 3.2 mmol/L — ABNORMAL LOW (ref 3.5–5.1)
Sodium: 137 mmol/L (ref 135–145)
Total Bilirubin: 0.4 mg/dL (ref 0.3–1.2)
Total Protein: 6.5 g/dL (ref 6.5–8.1)

## 2022-07-16 LAB — CBC WITH DIFFERENTIAL/PLATELET
Abs Immature Granulocytes: 0.01 10*3/uL (ref 0.00–0.07)
Basophils Absolute: 0 10*3/uL (ref 0.0–0.1)
Basophils Relative: 1 %
Eosinophils Absolute: 0.2 10*3/uL (ref 0.0–0.5)
Eosinophils Relative: 5 %
HCT: 44.6 % (ref 39.0–52.0)
Hemoglobin: 15.3 g/dL (ref 13.0–17.0)
Immature Granulocytes: 0 %
Lymphocytes Relative: 26 %
Lymphs Abs: 1.1 10*3/uL (ref 0.7–4.0)
MCH: 31.7 pg (ref 26.0–34.0)
MCHC: 34.3 g/dL (ref 30.0–36.0)
MCV: 92.5 fL (ref 80.0–100.0)
Monocytes Absolute: 0.4 10*3/uL (ref 0.1–1.0)
Monocytes Relative: 9 %
Neutro Abs: 2.5 10*3/uL (ref 1.7–7.7)
Neutrophils Relative %: 59 %
Platelets: 185 10*3/uL (ref 150–400)
RBC: 4.82 MIL/uL (ref 4.22–5.81)
RDW: 13.2 % (ref 11.5–15.5)
WBC: 4.2 10*3/uL (ref 4.0–10.5)
nRBC: 0 % (ref 0.0–0.2)

## 2022-07-16 LAB — TSH: TSH: 9.976 u[IU]/mL — ABNORMAL HIGH (ref 0.350–4.500)

## 2022-07-16 LAB — T4, FREE: Free T4: 0.66 ng/dL (ref 0.61–1.12)

## 2022-07-16 MED ORDER — AMITRIPTYLINE HCL 10 MG PO TABS
10.0000 mg | ORAL_TABLET | Freq: Every day | ORAL | 0 refills | Status: DC
  Filled 2022-07-16: qty 15, 15d supply, fill #0

## 2022-07-16 MED ORDER — DARATUMUMAB-HYALURONIDASE-FIHJ 1800-30000 MG-UT/15ML ~~LOC~~ SOLN
1800.0000 mg | Freq: Once | SUBCUTANEOUS | Status: AC
  Administered 2022-07-16: 1800 mg via SUBCUTANEOUS
  Filled 2022-07-16: qty 15

## 2022-07-16 MED ORDER — PREGABALIN 150 MG PO CAPS
150.0000 mg | ORAL_CAPSULE | Freq: Three times a day (TID) | ORAL | 0 refills | Status: DC | PRN
  Filled 2022-07-16: qty 90, 30d supply, fill #0
  Filled 2022-08-13: qty 90, 30d supply, fill #1

## 2022-07-16 MED ORDER — LEVOTHYROXINE SODIUM 50 MCG PO TABS
50.0000 ug | ORAL_TABLET | Freq: Every day | ORAL | 2 refills | Status: DC
  Filled 2022-07-16: qty 30, 30d supply, fill #0
  Filled 2022-08-13: qty 30, 30d supply, fill #1

## 2022-07-16 NOTE — Patient Instructions (Signed)
[place] [facility] [place] [facility]  Discharge Instructions: Thank you for choosing [facility] to provide your oncology and hematology care.  If you have a lab appointment with the Cancer Center, please go directly to the Cancer Center and check in at the registration area.  Wear comfortable clothing and clothing appropriate for easy access to any Portacath or PICC line.   We strive to give you quality time with your provider. You may need to reschedule your appointment if you arrive late (15 or more minutes).  Arriving late affects you and other patients whose appointments are after yours.  Also, if you miss three or more appointments without notifying the office, you may be dismissed from the clinic at the provider's discretion.      For prescription refill requests, have your pharmacy contact our office and allow 72 hours for refills to be completed.    Today you received the following chemotherapy and/or immunotherapy agents Daratumumab.      To help prevent nausea and vomiting after your treatment, we encourage you to take your nausea medication as directed.  BELOW ARE SYMPTOMS THAT SHOULD BE REPORTED IMMEDIATELY: *FEVER GREATER THAN 100.4 F (38 C) OR HIGHER *CHILLS OR SWEATING *NAUSEA AND VOMITING THAT IS NOT CONTROLLED WITH YOUR NAUSEA MEDICATION *UNUSUAL SHORTNESS OF BREATH *UNUSUAL BRUISING OR BLEEDING *URINARY PROBLEMS (pain or burning when urinating, or frequent urination) *BOWEL PROBLEMS (unusual diarrhea, constipation, pain near the anus) TENDERNESS IN MOUTH AND THROAT WITH OR WITHOUT PRESENCE OF ULCERS (sore throat, sores in mouth, or a toothache) UNUSUAL RASH, SWELLING OR PAIN  UNUSUAL VAGINAL DISCHARGE OR ITCHING   Items with * indicate a potential emergency and should be followed up as soon as possible or go to the Emergency Department if any problems should occur.  Please show the CHEMOTHERAPY ALERT CARD or IMMUNOTHERAPY ALERT CARD at check-in  to the Emergency Department and triage nurse.  Should you have questions after your visit or need to cancel or reschedule your appointment, please contact [facility] [facility] [place] [facility]  [phone] and follow the prompts.  Office hours are 8:00 a.m. to 4:30 p.m. Monday - Friday. Please note that voicemails left after 4:00 p.m. may not be returned until the following business day.  We are closed weekends and major holidays. You have access to a nurse at all times for urgent questions. Please call the main number to the clinic [phone] and follow the prompts.  For any non-urgent questions, you may also contact your provider using MyChart. We now offer e-Visits for anyone 18 and older to request care online for non-urgent symptoms. For details visit [email or web address].   Also download the MyChart app! Go to the app store, search "MyChart", open the app, select , and log in with your MyChart username and password.    

## 2022-07-16 NOTE — Progress Notes (Addendum)
[place] at [facility] Telephone:(336) [phone] [phone]) [phone]   Name: James House. Date: 07/16/2022 MRN: [id number]  DOB: 12-26-1970  Patient Care Team: Pleas Koch, NP as PCP - General (Internal Medicine) Vella Redhead, MD as PCP - Hematology/Oncology Sindy Guadeloupe, MD as Consulting Physician (Hematology and Oncology)    REASON FOR CONSULTATION: James House. is a 52 y.o. male with multiple medical problems including kappa light chain myeloma status post stem cell transplant in 2018 followed by Revlimid maintenance.  Repeat bone marrow biopsy in 2023 suggested persistent plasma cell neoplasm.  Patient was restarted on treatment with Darzalex Pomalyst and dexamethasone regimen.  He has history of chronic pain and was previously managed by the pain clinic prior to being discharged due to UDS positive for THC.  Patient has had some difficulty with insomnia.  He was referred to palliative care to address goals and manage ongoing symptoms.  SOCIAL HISTORY:     reports that he has quit smoking. His smokeless tobacco use includes snuff. He reports that he does not drink alcohol and does not use drugs.  Patient is unmarried.  He lives at home with his daughter.  He has another daughter lives nearby.  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  ADVANCE DIRECTIVES:    CODE STATUS:   PAST MEDICAL HISTORY: Past Medical History:  Diagnosis Date   CKD (chronic kidney disease) stage 3, GFR 30-59 ml/min (HCC)    Hypertension    Multiple myeloma ([place]) 08/11/2017   Neuropathy    Pneumonia    Severe sepsis ([place]) 08/11/2017   Shingles 08/03/2019    PAST SURGICAL HISTORY:  Past Surgical History:  Procedure Laterality Date   ABDOMINAL SURGERY     COLONOSCOPY WITH PROPOFOL N/A 05/25/2021   Procedure: COLONOSCOPY WITH PROPOFOL;  Surgeon: Lin Landsman, MD;  Location: [facility] ENDOSCOPY;  Service: Gastroenterology;  Laterality:  N/A;   JOINT REPLACEMENT     right knee   KNEE SURGERY Left     HEMATOLOGY/ONCOLOGY HISTORY:  Oncology History  Multiple myeloma ([place])  08/11/2017 Initial Diagnosis   Multiple myeloma ([place])   10/22/2021 Cancer Staging   Staging form: Plasma Cell Myeloma and Plasma Cell Disorders, AJCC 8th Edition - Clinical stage from 10/22/2021: High-risk cytogenetics: Unknown, LDH: Normal - Signed by Sindy Guadeloupe, MD on 10/22/2021 Stage prefix: Recurrence Cytogenetics: Unknown   06/04/2022 -  Chemotherapy   Patient is on Treatment Plan : MYELOMA RELAPSED REFRACTORY Daratumumab SQ + Pomalidomide + Dexamethasone (DaraPd) q28d       ALLERGIES:  has No Known Allergies.  MEDICATIONS:  Current Outpatient Medications  Medication Sig Dispense Refill   acyclovir (ZOVIRAX) 400 MG tablet Take 1 tablet (400 mg total) by mouth 2 (two) times daily. 60 tablet 11   amLODipine (NORVASC) 10 MG tablet Take 1 tablet (10 mg total) by mouth daily. For blood pressure. 90 tablet 0   aspirin EC 81 MG tablet Take 81 mg by mouth daily.     baclofen (LIORESAL) 10 MG tablet Take 1 tablet (10 mg total) by mouth 4 (four) times daily as needed for muscle spasms. 360 tablet 0   celecoxib (CELEBREX) 200 MG capsule Take 1 capsule (200 mg total) by mouth daily. 30 capsule 2   cyanocobalamin (VITAMIN B12) 1000 MCG/ML injection Inject 1 mL (1,000 mcg total) into the muscle every 30 (thirty) days. 1 mL 11   dexamethasone (DECADRON) 4 MG tablet  Take 5 tablets (20 mg total) by mouth 2 (two) times a week. Take first dose 1 hour prior to reporting to clinic for daratumumab. Take 2nd dose the day after daratumumab. 40 tablet 1   DULoxetine (CYMBALTA) 30 MG capsule Take 1 capsule (30 mg total) by mouth daily. 30 capsule 11   lidocaine-prilocaine (EMLA) cream Apply to affected area once 30 g 3   montelukast (SINGULAIR) 10 MG tablet Take 1 tablet in the evening 1 day before your treatment. Day of treatment take 1 tablet in the morning for the  first 2 treatments of Daratumumab 4 tablet 0   OLANZapine (ZYPREXA) 10 MG tablet Take 1 tablet (10 mg total) by mouth at bedtime. To help with nausea prevention. 30 tablet 1   olmesartan (BENICAR) 20 MG tablet Take 1 tablet (20 mg total) by mouth daily. For blood pressure. 90 tablet 3   ondansetron (ZOFRAN) 8 MG tablet Take 1 tablet (8 mg total) by mouth every 8 (eight) hours as needed for nausea or vomiting. 30 tablet 1   Oxycodone HCl 10 MG TABS Take 1 tablet (10 mg total) by mouth every 8 (eight) hours as needed. 90 tablet 0   pomalidomide (POMALYST) 3 MG capsule Take 1 capsule (3 mg total) by mouth daily. 1 tablet daily for 21 days and then 7 days off.  REMS: [id number] obtained on 06/23/22 21 capsule 0   potassium chloride SA (KLOR-CON M) 20 MEQ tablet Take 1 tablet (20 mEq total) by mouth daily. 21 tablet 0   pregabalin (LYRICA) 150 MG capsule Take 1 capsule (150 mg total) by mouth 3 (three) times daily as needed (for pain). 270 capsule 0   prochlorperazine (COMPAZINE) 10 MG tablet Take 1 tablet (10 mg total) by mouth every 6 (six) hours as needed for nausea or vomiting. 60 tablet 0   prochlorperazine (COMPAZINE) 10 MG tablet Take 1 tablet (10 mg total) by mouth every 6 (six) hours as needed for nausea or vomiting. 30 tablet 1   sertraline (ZOLOFT) 25 MG tablet Take 1 tablet (25 mg total) by mouth daily. 30 tablet 11   SUMAtriptan (IMITREX) 50 MG tablet Take 1 tablet by mouth at migraine onset. May repeat in 2 hours if headache persists or recurs. 10 tablet 0   Syringe/Needle, Disp, (SYRINGE 3CC/25GX1") 25G X 1" 3 ML MISC 1 Syringe by Does not apply route every 30 (thirty) days. 12 each 0   topiramate (TOPAMAX) 50 MG tablet Take 1 tablet (50 mg total) by mouth at bedtime. For headache prevention. 90 tablet 2   No current facility-administered medications for this visit.   Facility-Administered Medications Ordered in Other Visits  Medication Dose Route Frequency Provider Last Rate Last Admin    daratumumab-hyaluronidase-fihj (DARZALEX FASPRO) 1800-30000 MG-UT/15ML chemo SQ injection 1,800 mg  1,800 mg Subcutaneous Once Sindy Guadeloupe, MD        VITAL SIGNS: There were no vitals taken for this visit. There were no vitals filed for this visit.  Estimated body mass index is 23.41 kg/m as calculated from the following:   Height as of 06/25/22: 5\' 8"  (1.727 m).   Weight as of an earlier encounter on 07/16/22: 153 lb 15.9 oz (69.8 kg).  LABS: CBC:    Component Value Date/Time   WBC 4.2 07/16/2022 0825   HGB 15.3 07/16/2022 0825   HCT 44.6 07/16/2022 0825   PLT 185 07/16/2022 0825   MCV 92.5 07/16/2022 0825   NEUTROABS 2.5 07/16/2022 0825  LYMPHSABS 1.1 07/16/2022 0825   MONOABS 0.4 07/16/2022 0825   EOSABS 0.2 07/16/2022 0825   BASOSABS 0.0 07/16/2022 0825   Comprehensive Metabolic Panel:    Component Value Date/Time   NA 137 07/16/2022 0825   NA 140 11/26/2017 1307   K 3.2 (L) 07/16/2022 0825   CL 105 07/16/2022 0825   CO2 26 07/16/2022 0825   BUN 11 07/16/2022 0825   BUN 11 11/26/2017 1307   CREATININE 1.35 (H) 07/16/2022 0825   GLUCOSE 107 (H) 07/16/2022 0825   CALCIUM 9.0 07/16/2022 0825   AST 42 (H) 07/16/2022 0825   ALT 36 07/16/2022 0825   ALKPHOS 113 07/16/2022 0825   BILITOT 0.4 07/16/2022 0825   PROT 6.5 07/16/2022 0825   PROT 7.0 11/26/2017 1307   ALBUMIN 3.8 07/16/2022 0825   ALBUMIN 4.3 11/26/2017 1307    RADIOGRAPHIC STUDIES: MR Knee Left  Wo Contrast  Result Date: 07/01/2022 CLINICAL DATA:  Knee pain with swelling and popping for 3 months. History of partial knee replacement. Recurrent multiple myeloma. Evaluate for neoplasm of bone. EXAM: MRI OF THE LEFT KNEE WITHOUT CONTRAST TECHNIQUE: Multiplanar, multisequence MR imaging of the knee was performed. No intravenous contrast was administered. Protocol was adjusted to minimize artifact from the patient's lateral compartment arthroplasty. COMPARISON:  Radiographs 06/12/2022 and 08/25/2017. PET-CT  05/10/2022. FINDINGS: MENISCI Medial meniscus:  Intact with normal morphology. Lateral meniscus:  Status post lateral compartment arthroplasty. LIGAMENTS Cruciates: Partially obscured by artifact from the prior arthroplasty. Grossly intact. Collaterals: The medial collateral ligament appears normal. The lateral collateral ligament complex is grossly intact although partly obscured by artifact. CARTILAGE Patellofemoral: Mild chondral thinning without focal chondral defect. Medial:  Mild chondral thinning without focal defect. Lateral:  Status post lateral compartment arthroplasty. MISCELLANEOUS Joint:  Small joint effusion.  Mild synovial/capsular thickening. Popliteal Fossa: The popliteus muscle and tendon are intact. Tiny Baker's cyst. Extensor Mechanism: Patellar tendinosis or postsurgical change with mild overlying susceptibility artifact. No acute findings. There is scarring in Hoffa's fat. The quadriceps tendon appears unremarkable. Bones: There are small bone infarcts laterally in the distal femoral metaphysis and centrally in the proximal tibial metaphysis. No findings suspicious for multiple myeloma. No evidence of acute fracture or dislocation. Other: No other significant periarticular soft tissue findings. IMPRESSION: 1. No evidence of multiple myeloma or acute osseous findings. Small bone infarcts in the distal femur and proximal tibia. 2. Small knee joint effusion with mild synovial/capsular thickening. 3. Previous lateral compartment arthroplasty. Mild medial and patellofemoral compartment chondral thinning without focal chondral defect. 4. Grossly intact medial meniscus, cruciate and collateral ligaments. Electronically Signed   By: Richardean Sale M.D.   On: 07/01/2022 13:55    PERFORMANCE STATUS (ECOG) : 1 - Symptomatic but completely ambulatory  Review of Systems Unless otherwise noted, a complete review of systems is negative.  Physical Exam General: NAD Pulmonary: Unlabored Extremities:  no edema, no joint deformities Skin: no rashes Neurological: Grossly nonfocal  IMPRESSION: Patient seen in infusion.  He is accompanied by his daughter.  Patient endorses chronic insomnia.  He works second shift at [employer] and finds that he often will lie in bed for hours prior to initiating sleep.  He then has difficulty staying asleep and feels chronically fatigued.  He denies significant anxiety or depressive symptoms.  He denies any caffeine utilization.  He reports maintaining the same bedtime pattern.    Patient does have chronic pain for which she takes oxycodone every 8 hours.  He also has  chemo-induced peripheral neuropathy, which patient describes as being poorly controlled.  Patient is on duloxetine, which was started by his [facility] oncologist about 4 months ago without dose adjustment.  He is also on Lyrica 3 times daily.  Most recently, patient was started on Seroquel for insomnia with recent dose adjustment to 50 mg nightly.  Patient says that this might have slightly improved his ability to get to sleep but sleep is still interrupted and of poor quality.  Med list reviewed with patient.  Patient is also on olanzapine nightly for chemo induced nausea.  Would recommend discontinuing Seroquel to try to limit multiple antipsychotics.  As patient still has poorly controlled peripheral neuropathy, I suggested that we could try rotating him from duloxetine to amitriptyline, which might provide dual effect of helping to improve neuropathic pain in addition to his insomnia.  Patient has no documented history of cardiovascular disease.  PLAN: -Continue current scope of treatment -Discontinue duloxetine, sertraline and quetiapine -Start amitriptyline 10 mg nightly and titrate as tolerated -EKG and CMP at next office visit -RTC next week  Case and plan discussed with Dr. Janese Banks  Patient expressed understanding and was in agreement with this plan. He also understands that He can call the  clinic at any time with any questions, concerns, or complaints.     Time Total: 25 minutes  Visit consisted of counseling and education dealing with the complex and emotionally intense issues of symptom management and palliative care in the setting of serious and potentially life-threatening illness.Greater than 50%  of this time was spent counseling and coordinating care related to the above assessment and plan.  Signed by: Altha Harm, PhD, NP-C

## 2022-07-16 NOTE — Progress Notes (Signed)
Verified with patient- took home premedications prior to treatment.

## 2022-07-19 ENCOUNTER — Other Ambulatory Visit: Payer: Self-pay | Admitting: Oncology

## 2022-07-19 DIAGNOSIS — C9000 Multiple myeloma not having achieved remission: Secondary | ICD-10-CM

## 2022-07-22 ENCOUNTER — Encounter: Payer: Self-pay | Admitting: Oncology

## 2022-07-23 ENCOUNTER — Inpatient Hospital Stay (HOSPITAL_BASED_OUTPATIENT_CLINIC_OR_DEPARTMENT_OTHER): Payer: BC Managed Care – PPO | Admitting: Oncology

## 2022-07-23 ENCOUNTER — Inpatient Hospital Stay: Payer: BC Managed Care – PPO | Attending: Internal Medicine

## 2022-07-23 ENCOUNTER — Encounter: Payer: Self-pay | Admitting: Oncology

## 2022-07-23 ENCOUNTER — Other Ambulatory Visit: Payer: Self-pay

## 2022-07-23 ENCOUNTER — Inpatient Hospital Stay (HOSPITAL_BASED_OUTPATIENT_CLINIC_OR_DEPARTMENT_OTHER): Payer: BC Managed Care – PPO | Admitting: Hospice and Palliative Medicine

## 2022-07-23 ENCOUNTER — Other Ambulatory Visit: Payer: Self-pay | Admitting: *Deleted

## 2022-07-23 ENCOUNTER — Inpatient Hospital Stay: Payer: BC Managed Care – PPO

## 2022-07-23 ENCOUNTER — Other Ambulatory Visit: Payer: Self-pay | Admitting: Pharmacist

## 2022-07-23 VITALS — BP 136/97 | HR 93 | Temp 97.7°F | Ht 69.0 in | Wt 149.9 lb

## 2022-07-23 DIAGNOSIS — C9002 Multiple myeloma in relapse: Secondary | ICD-10-CM | POA: Insufficient documentation

## 2022-07-23 DIAGNOSIS — M545 Low back pain, unspecified: Secondary | ICD-10-CM | POA: Insufficient documentation

## 2022-07-23 DIAGNOSIS — G629 Polyneuropathy, unspecified: Secondary | ICD-10-CM | POA: Diagnosis not present

## 2022-07-23 DIAGNOSIS — Z5112 Encounter for antineoplastic immunotherapy: Secondary | ICD-10-CM | POA: Diagnosis not present

## 2022-07-23 DIAGNOSIS — G620 Drug-induced polyneuropathy: Secondary | ICD-10-CM | POA: Diagnosis not present

## 2022-07-23 DIAGNOSIS — Z79899 Other long term (current) drug therapy: Secondary | ICD-10-CM | POA: Insufficient documentation

## 2022-07-23 DIAGNOSIS — Z5111 Encounter for antineoplastic chemotherapy: Secondary | ICD-10-CM | POA: Diagnosis not present

## 2022-07-23 DIAGNOSIS — G8929 Other chronic pain: Secondary | ICD-10-CM | POA: Diagnosis not present

## 2022-07-23 DIAGNOSIS — Z800 Family history of malignant neoplasm of digestive organs: Secondary | ICD-10-CM | POA: Insufficient documentation

## 2022-07-23 DIAGNOSIS — Z515 Encounter for palliative care: Secondary | ICD-10-CM

## 2022-07-23 DIAGNOSIS — Z7982 Long term (current) use of aspirin: Secondary | ICD-10-CM | POA: Diagnosis not present

## 2022-07-23 DIAGNOSIS — Z87891 Personal history of nicotine dependence: Secondary | ICD-10-CM | POA: Diagnosis not present

## 2022-07-23 DIAGNOSIS — Z9484 Stem cells transplant status: Secondary | ICD-10-CM | POA: Insufficient documentation

## 2022-07-23 DIAGNOSIS — E876 Hypokalemia: Secondary | ICD-10-CM | POA: Insufficient documentation

## 2022-07-23 DIAGNOSIS — C9000 Multiple myeloma not having achieved remission: Secondary | ICD-10-CM

## 2022-07-23 DIAGNOSIS — C9001 Multiple myeloma in remission: Secondary | ICD-10-CM

## 2022-07-23 LAB — CMP (CANCER CENTER ONLY)
ALT: 29 U/L (ref 0–44)
AST: 27 U/L (ref 15–41)
Albumin: 4.3 g/dL (ref 3.5–5.0)
Alkaline Phosphatase: 130 U/L — ABNORMAL HIGH (ref 38–126)
Anion gap: 7 (ref 5–15)
BUN: 16 mg/dL (ref 6–20)
CO2: 25 mmol/L (ref 22–32)
Calcium: 9.6 mg/dL (ref 8.9–10.3)
Chloride: 104 mmol/L (ref 98–111)
Creatinine: 1.48 mg/dL — ABNORMAL HIGH (ref 0.61–1.24)
GFR, Estimated: 57 mL/min — ABNORMAL LOW (ref >60)
Glucose, Bld: 115 mg/dL — ABNORMAL HIGH (ref 70–99)
Potassium: 3.4 mmol/L — ABNORMAL LOW (ref 3.5–5.1)
Sodium: 136 mmol/L (ref 135–145)
Total Bilirubin: 0.7 mg/dL (ref 0.3–1.2)
Total Protein: 7.3 g/dL (ref 6.5–8.1)

## 2022-07-23 LAB — CBC WITH DIFFERENTIAL/PLATELET
Abs Immature Granulocytes: 0.01 10*3/uL (ref 0.00–0.07)
Basophils Absolute: 0 10*3/uL (ref 0.0–0.1)
Basophils Relative: 1 %
Eosinophils Absolute: 0.2 10*3/uL (ref 0.0–0.5)
Eosinophils Relative: 3 %
HCT: 49.9 % (ref 39.0–52.0)
Hemoglobin: 17.1 g/dL — ABNORMAL HIGH (ref 13.0–17.0)
Immature Granulocytes: 0 %
Lymphocytes Relative: 24 %
Lymphs Abs: 1.2 10*3/uL (ref 0.7–4.0)
MCH: 31.6 pg (ref 26.0–34.0)
MCHC: 34.3 g/dL (ref 30.0–36.0)
MCV: 92.2 fL (ref 80.0–100.0)
Monocytes Absolute: 0.4 10*3/uL (ref 0.1–1.0)
Monocytes Relative: 8 %
Neutro Abs: 3.4 10*3/uL (ref 1.7–7.7)
Neutrophils Relative %: 64 %
Platelets: 226 10*3/uL (ref 150–400)
RBC: 5.41 MIL/uL (ref 4.22–5.81)
RDW: 13.2 % (ref 11.5–15.5)
WBC: 5.2 10*3/uL (ref 4.0–10.5)
nRBC: 0 % (ref 0.0–0.2)

## 2022-07-23 MED ORDER — POMALIDOMIDE 2 MG PO CAPS: 2.0000 mg | ORAL_CAPSULE | Freq: Every day | ORAL | 0 refills | Status: DC

## 2022-07-23 MED ORDER — DARATUMUMAB-HYALURONIDASE-FIHJ 1800-30000 MG-UT/15ML ~~LOC~~ SOLN
1800.0000 mg | Freq: Once | SUBCUTANEOUS | Status: AC
  Administered 2022-07-23: 1800 mg via SUBCUTANEOUS
  Filled 2022-07-23: qty 15

## 2022-07-23 MED ORDER — OXYCODONE HCL 10 MG PO TABS
10.0000 mg | ORAL_TABLET | Freq: Three times a day (TID) | ORAL | 0 refills | Status: DC | PRN
  Filled 2022-07-23: qty 90, 30d supply, fill #0

## 2022-07-23 NOTE — Progress Notes (Signed)
Complains of low back pain.  States it's chronic.  Rates an 8/10.  Needs refill on pain meds.  Has new onset of unsteadiness.  States he has caught himself from falling a couple of times.  Still has difficulty sleeping, but has notable changes in his work schedule from 2nd to very early first shifts.

## 2022-07-23 NOTE — Progress Notes (Signed)
[place] at [facility] Telephone:([phone] Fax:(336) [phone]   Name: James House. Date: 07/23/2022 MRN: [id number]  DOB: [birth year]  Patient Care Team: Pleas Koch, NP as PCP - General (Internal Medicine) Vella Redhead, MD as PCP - Hematology/Oncology Sindy Guadeloupe, MD as Consulting Physician (Hematology and Oncology)    REASON FOR CONSULTATION: James House. is a 52 y.o. male with multiple medical problems including kappa light chain myeloma status post stem cell transplant in 2018 followed by Revlimid maintenance.  Repeat bone marrow biopsy in 2023 suggested persistent plasma cell neoplasm.  Patient was restarted on treatment with Darzalex Pomalyst and dexamethasone regimen.  He has history of chronic pain and was previously managed by the pain clinic prior to being discharged due to UDS positive for THC.  Patient has had some difficulty with insomnia.  He was referred to palliative care to address goals and manage ongoing symptoms.  SOCIAL HISTORY:     reports that he has quit smoking. His smokeless tobacco use includes snuff. He reports that he does not drink alcohol and does not use drugs.  Patient is unmarried.  He lives at home with his daughter.  He has another daughter lives nearby.  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  ADVANCE DIRECTIVES:    CODE STATUS:   PAST MEDICAL HISTORY: Past Medical History:  Diagnosis Date   CKD (chronic kidney disease) stage 3, GFR 30-59 ml/min (HCC)    Hypertension    Multiple myeloma ([place]) 08/11/2017   Neuropathy    Pneumonia    Severe sepsis ([place]) 08/11/2017   Shingles 08/03/2019    PAST SURGICAL HISTORY:  Past Surgical History:  Procedure Laterality Date   ABDOMINAL SURGERY     COLONOSCOPY WITH PROPOFOL N/A 05/25/2021   Procedure: COLONOSCOPY WITH PROPOFOL;  Surgeon: Lin Landsman, MD;  Location: [facility] ENDOSCOPY;  Service: Gastroenterology;  Laterality: N/A;    JOINT REPLACEMENT     right knee   KNEE SURGERY Left     HEMATOLOGY/ONCOLOGY HISTORY:  Oncology History  Multiple myeloma  08/11/2017 Initial Diagnosis   Multiple myeloma ([place])   10/22/2021 Cancer Staging   Staging form: Plasma Cell Myeloma and Plasma Cell Disorders, AJCC 8th Edition - Clinical stage from 10/22/2021: High-risk cytogenetics: Unknown, LDH: Normal - Signed by Sindy Guadeloupe, MD on 10/22/2021 Stage prefix: Recurrence Cytogenetics: Unknown   06/04/2022 -  Chemotherapy   Patient is on Treatment Plan : MYELOMA RELAPSED REFRACTORY Daratumumab SQ + Pomalidomide + Dexamethasone (DaraPd) q28d       ALLERGIES:  has No Known Allergies.  MEDICATIONS:  Current Outpatient Medications  Medication Sig Dispense Refill   acyclovir (ZOVIRAX) 400 MG tablet Take 1 tablet (400 mg total) by mouth 2 (two) times daily. 60 tablet 11   amitriptyline (ELAVIL) 10 MG tablet Take 1 tablet (10 mg total) by mouth at bedtime. 15 tablet 0   amLODipine (NORVASC) 10 MG tablet Take 1 tablet (10 mg total) by mouth daily. For blood pressure. 90 tablet 0   aspirin EC 81 MG tablet Take 81 mg by mouth daily.     baclofen (LIORESAL) 10 MG tablet Take 1 tablet (10 mg total) by mouth 4 (four) times daily as needed for muscle spasms. 360 tablet 0   celecoxib (CELEBREX) 200 MG capsule Take 1 capsule (200 mg total) by mouth daily. 30 capsule 2   cyanocobalamin (VITAMIN B12) 1000 MCG/ML injection Inject 1 mL (1,000  mcg total) into the muscle every 30 (thirty) days. 1 mL 11   dexamethasone (DECADRON) 4 MG tablet Take 5 tablets (20 mg total) by mouth 2 (two) times a week. Take first dose 1 hour prior to reporting to clinic for daratumumab. Take 2nd dose the day after daratumumab. 40 tablet 1   levothyroxine (SYNTHROID) 50 MCG tablet Take 1 tablet (50 mcg total) by mouth daily before breakfast. 30 tablet 2   lidocaine-prilocaine (EMLA) cream Apply to affected area once 30 g 3   montelukast (SINGULAIR) 10 MG tablet Take 1  tablet in the evening 1 day before your treatment. Day of treatment take 1 tablet in the morning for the first 2 treatments of Daratumumab 4 tablet 0   OLANZapine (ZYPREXA) 10 MG tablet Take 1 tablet (10 mg total) by mouth at bedtime. To help with nausea prevention. 30 tablet 1   olmesartan (BENICAR) 20 MG tablet Take 1 tablet (20 mg total) by mouth daily. For blood pressure. 90 tablet 3   ondansetron (ZOFRAN) 8 MG tablet Take 1 tablet (8 mg total) by mouth every 8 (eight) hours as needed for nausea or vomiting. 30 tablet 1   Oxycodone HCl 10 MG TABS Take 1 tablet (10 mg total) by mouth every 8 (eight) hours as needed. 90 tablet 0   pomalidomide (POMALYST) 3 MG capsule Take 1 capsule (3 mg total) by mouth daily. 1 capsule for 21 days,  days off.  Obtained 07/19/2022, REMS: [id number] 21 capsule 0   potassium chloride SA (KLOR-CON M) 20 MEQ tablet Take 1 tablet (20 mEq total) by mouth daily. 21 tablet 0   pregabalin (LYRICA) 150 MG capsule Take 1 capsule (150 mg total) by mouth 3 (three) times daily as needed (for pain). 270 capsule 0   SUMAtriptan (IMITREX) 50 MG tablet Take 1 tablet by mouth at migraine onset. May repeat in 2 hours if headache persists or recurs. 10 tablet 0   Syringe/Needle, Disp, (SYRINGE 3CC/25GX1") 25G X 1" 3 ML MISC 1 Syringe by Does not apply route every 30 (thirty) days. 12 each 0   topiramate (TOPAMAX) 50 MG tablet Take 1 tablet (50 mg total) by mouth at bedtime. For headache prevention. 90 tablet 2   No current facility-administered medications for this visit.    VITAL SIGNS: There were no vitals taken for this visit. There were no vitals filed for this visit.  Estimated body mass index is 22.14 kg/m as calculated from the following:   Height as of an earlier encounter on 07/23/22: 5\' 9"  (1.753 m).   Weight as of an earlier encounter on 07/23/22: 149 lb 14.4 oz (68 kg).  LABS: CBC:    Component Value Date/Time   WBC 5.2 07/23/2022 0836   HGB 17.1 (H) 07/23/2022 0836    HCT 49.9 07/23/2022 0836   PLT 226 07/23/2022 0836   MCV 92.2 07/23/2022 0836   NEUTROABS 3.4 07/23/2022 0836   LYMPHSABS 1.2 07/23/2022 0836   MONOABS 0.4 07/23/2022 0836   EOSABS 0.2 07/23/2022 0836   BASOSABS 0.0 07/23/2022 0836   Comprehensive Metabolic Panel:    Component Value Date/Time   NA 136 07/23/2022 0836   NA 140 11/26/2017 1307   K 3.4 (L) 07/23/2022 0836   CL 104 07/23/2022 0836   CO2 25 07/23/2022 0836   BUN 16 07/23/2022 0836   BUN 11 11/26/2017 1307   CREATININE 1.48 (H) 07/23/2022 0836   GLUCOSE 115 (H) 07/23/2022 0836   CALCIUM 9.6 07/23/2022 0836  AST 27 07/23/2022 0836   ALT 29 07/23/2022 0836   ALKPHOS 130 (H) 07/23/2022 0836   BILITOT 0.7 07/23/2022 0836   PROT 7.3 07/23/2022 0836   PROT 7.0 11/26/2017 1307   ALBUMIN 4.3 07/23/2022 0836   ALBUMIN 4.3 11/26/2017 1307    RADIOGRAPHIC STUDIES: MR Knee Left  Wo Contrast  Result Date: 07/01/2022 CLINICAL DATA:  Knee pain with swelling and popping for 3 months. History of partial knee replacement. Recurrent multiple myeloma. Evaluate for neoplasm of bone. EXAM: MRI OF THE LEFT KNEE WITHOUT CONTRAST TECHNIQUE: Multiplanar, multisequence MR imaging of the knee was performed. No intravenous contrast was administered. Protocol was adjusted to minimize artifact from the patient's lateral compartment arthroplasty. COMPARISON:  Radiographs 06/12/2022 and 08/25/2017. PET-CT 05/10/2022. FINDINGS: MENISCI Medial meniscus:  Intact with normal morphology. Lateral meniscus:  Status post lateral compartment arthroplasty. LIGAMENTS Cruciates: Partially obscured by artifact from the prior arthroplasty. Grossly intact. Collaterals: The medial collateral ligament appears normal. The lateral collateral ligament complex is grossly intact although partly obscured by artifact. CARTILAGE Patellofemoral: Mild chondral thinning without focal chondral defect. Medial:  Mild chondral thinning without focal defect. Lateral:  Status post  lateral compartment arthroplasty. MISCELLANEOUS Joint:  Small joint effusion.  Mild synovial/capsular thickening. Popliteal Fossa: The popliteus muscle and tendon are intact. Tiny Baker's cyst. Extensor Mechanism: Patellar tendinosis or postsurgical change with mild overlying susceptibility artifact. No acute findings. There is scarring in Hoffa's fat. The quadriceps tendon appears unremarkable. Bones: There are small bone infarcts laterally in the distal femoral metaphysis and centrally in the proximal tibial metaphysis. No findings suspicious for multiple myeloma. No evidence of acute fracture or dislocation. Other: No other significant periarticular soft tissue findings. IMPRESSION: 1. No evidence of multiple myeloma or acute osseous findings. Small bone infarcts in the distal femur and proximal tibia. 2. Small knee joint effusion with mild synovial/capsular thickening. 3. Previous lateral compartment arthroplasty. Mild medial and patellofemoral compartment chondral thinning without focal chondral defect. 4. Grossly intact medial meniscus, cruciate and collateral ligaments. Electronically Signed   By: Richardean Sale M.D.   On: 07/01/2022 13:55    PERFORMANCE STATUS (ECOG) : 1 - Symptomatic but completely ambulatory  Review of Systems Unless otherwise noted, a complete review of systems is negative.  Physical Exam General: NAD Pulmonary: Unlabored Extremities: no edema, no joint deformities Skin: no rashes Neurological: Grossly nonfocal  IMPRESSION: Follow-up visit.  Patient accompanied by his daughter.  Patient reports that he trialed amitriptyline for 2 nights with some improvement in his sleep.  However, he has had a temporary change in his work schedule necessitating him waking at 3 AM to get ready for work.  He subsequently stopped taking the amitriptyline but states that his work schedule referred back to his normal after 2 weeks.  Too soon to tell if there is any clinical benefit from  the amitriptyline.  Recommended that he restart the amitriptyline after his work schedule normalizes in 2 weeks.  Hopefully, this will help with both insomnia as well as neuropathy.  He is on low-dose amitriptyline and we can liberalize the dosing as needed/tolerated.  Patient has no documented history of cardiovascular disease.  Will obtain baseline EKG today.  PLAN: -Continue current scope of treatment -Restart amitriptyline 10 mg nightly after work schedule normalizes -RTC 3 weeks   Patient expressed understanding and was in agreement with this plan. He also understands that He can call the clinic at any time with any questions, concerns, or complaints.  Time Total: 15 minutes  Visit consisted of counseling and education dealing with the complex and emotionally intense issues of symptom management and palliative care in the setting of serious and potentially life-threatening illness.Greater than 50%  of this time was spent counseling and coordinating care related to the above assessment and plan.  Signed by: Altha Harm, PhD, NP-C

## 2022-07-23 NOTE — Progress Notes (Signed)
Dr. Janese Banks would like to reduce patient from Pomalyst 3 mg to 2mg . Updated rx sent to [facility] and [facility] REMs called to update the dose associated with the REMs auth.

## 2022-07-23 NOTE — Patient Instructions (Signed)
[facility]  Discharge Instructions: Thank you for choosing [facility] to provide your oncology and hematology care.  If you have a lab appointment with the [facility], please go directly to the [facility] and check in at the registration area.  Wear comfortable clothing and clothing appropriate for easy access to any Portacath or PICC line.   We strive to give you quality time with your provider. You may need to reschedule your appointment if you arrive late (15 or more minutes).  Arriving late affects you and other patients whose appointments are after yours.  Also, if you miss three or more appointments without notifying the office, you may be dismissed from the clinic at the provider's discretion.      For prescription refill requests, have your pharmacy contact our office and allow 72 hours for refills to be completed.    Today you received the following chemotherapy and/or immunotherapy agents DARZALEX      To help prevent nausea and vomiting after your treatment, we encourage you to take your nausea medication as directed.  BELOW ARE SYMPTOMS THAT SHOULD BE REPORTED IMMEDIATELY: *FEVER GREATER THAN 100.4 F (38 C) OR HIGHER *CHILLS OR SWEATING *NAUSEA AND VOMITING THAT IS NOT CONTROLLED WITH YOUR NAUSEA MEDICATION *UNUSUAL SHORTNESS OF BREATH *UNUSUAL BRUISING OR BLEEDING *URINARY PROBLEMS (pain or burning when urinating, or frequent urination) *BOWEL PROBLEMS (unusual diarrhea, constipation, pain near the anus) TENDERNESS IN MOUTH AND THROAT WITH OR WITHOUT PRESENCE OF ULCERS (sore throat, sores in mouth, or a toothache) UNUSUAL RASH, SWELLING OR PAIN  UNUSUAL VAGINAL DISCHARGE OR ITCHING   Items with * indicate a potential emergency and should be followed up as soon as possible or go to the Emergency Department if any problems should occur.  Please show the CHEMOTHERAPY ALERT CARD or IMMUNOTHERAPY ALERT CARD at check-in to  the Emergency Department and triage nurse.  Should you have questions after your visit or need to cancel or reschedule your appointment, please contact [facility]  [phone] and follow the prompts.  Office hours are 8:00 a.m. to 4:30 p.m. Monday - Friday. Please note that voicemails left after 4:00 p.m. may not be returned until the following business day.  We are closed weekends and major holidays. You have access to a nurse at all times for urgent questions. Please call the main number to the clinic [phone] and follow the prompts.  For any non-urgent questions, you may also contact your provider using MyChart. We now offer e-Visits for anyone 68 and older to request care online for non-urgent symptoms. For details visit [email or web address].   Also download the MyChart app! Go to the app store, search "MyChart", open the app, select [facility], and log in with your MyChart username and password.

## 2022-07-23 NOTE — Progress Notes (Signed)
Hematology/Oncology Consult note [facility]  Telephone:([phone] Fax:([phone]) [phone]  Patient Care Team: Pleas Koch, NP as PCP - General (Internal Medicine) Vella Redhead, MD as PCP - Hematology/Oncology Sindy Guadeloupe, MD as Consulting Physician (Hematology and Oncology)   Name of the patient: James House  [id number]  03-Dec-1970   Date of visit: 07/23/22  Diagnosis- kappa light chain multiple myeloma in relapse    Chief complaint/ Reason for visit-on treatment assessment prior to cycle 2-day 22 of Darzalex  Heme/Onc history: Patient is a 52- year-old male with a history of kappa light chain myeloma that was treated by [name].  History is as follows:   1.  Patient presented with renal insufficiency with a creatinine of 2.4 in April 2018.  Kidney biopsy showed myeloma cast nephropathy.  Kappa light chain was 3004 and lambda 0.22 with a kappa lambda ratio of 13,000 655.  Skeletal survey showed multiple calvarial and marrow lesions.  Bone marrow biopsy in May 2018 showed 43% plasma cells.  He received CyBorD for cycle 1 in May 2018 and was subsequently switched to RVD.  He received 4 cycles followed by a repeat bone marrow biopsy in August 2018 which showed no increase in plasma cells.   2.  He underwent autologous stem cell transplantation on 01/30/2017.  He was started on Revlimid maintenance 10 mg daily in January 2019.   3.  Labs from January February and March 2019 done at [facility] showed no M spike, normal kappa lambda ratio of 1.06.  He was last seen by them in April 2019.   4. Following that patient was admitted to [facility] for healthcare associated pneumonia and was recently discharged.  He wished to transfer his care to [place] at this time.  He was on maintenance Revlimid 5 mg which was then reduced to 2.5 mg.  Follows up with pain clinic for chemo-induced peripheral neuropathy with Dr. Dossie Arbour.  His urine tested positive for [facility] and therefore  patient was discharged from pain clinic   5.  Noted to have gradually increasing free kappa light chainFrom 15 in 20 21-39.5 with a ratio of 3.09.  He was seen by [facility] again and underwent a repeat bone marrow biopsy which did not show any evidence of clonal plasma cells.  PET CT scan also did not show any new active lytic lesions in May 2023.Marland Kitchen  Revlimid dose was increased to 15 mg 3 weeks on 1 week off but kappa light chains show continuing to increase   6.  PET CT scan in December 2023 did not show any evidence of active myeloma.  Patient underwent repeat bone marrow biopsy at [facility] which showed 6% plasma cellsConsistent with persistent plasma cell neoplasm.  74 to 86% of isolated plasma cells show loss of T p53 and monosomy 13.  Patient was seen by [name] at [facility] and was recommended Darzalex Pomalyst dexamethasone regimen    Interval history-he has baseline fatigue and chronic back pain.  He also has pre-existing neuropathy especially in his feet.  States that symptoms are gradually getting worse and he has trouble with his balance at times.  ECOG PS- 1 Pain scale- 0   Review of systems- Review of Systems  Constitutional:  Positive for malaise/fatigue. Negative for chills, fever and weight loss.  HENT:  Negative for congestion, ear discharge and nosebleeds.   Eyes:  Negative for blurred vision.  Respiratory:  Negative for cough, hemoptysis, sputum production, shortness of breath  and wheezing.   Cardiovascular:  Negative for chest pain, palpitations, orthopnea and claudication.  Gastrointestinal:  Negative for abdominal pain, blood in stool, constipation, diarrhea, heartburn, melena, nausea and vomiting.  Genitourinary:  Negative for dysuria, flank pain, frequency, hematuria and urgency.  Musculoskeletal:  Negative for back pain, joint pain and myalgias.  Skin:  Negative for rash.  Neurological:  Positive for sensory change (peripheral neuropathy). Negative for dizziness, tingling, focal  weakness, seizures, weakness and headaches.  Endo/Heme/Allergies:  Does not bruise/bleed easily.  Psychiatric/Behavioral:  Negative for depression and suicidal ideas. The patient does not have insomnia.       No Known Allergies   Past Medical History:  Diagnosis Date   CKD (chronic kidney disease) stage 3, GFR 30-59 ml/min (HCC)    Hypertension    Multiple myeloma ([place]) 08/11/2017   Neuropathy    Pneumonia    Severe sepsis ([place]) 08/11/2017   Shingles 08/03/2019     Past Surgical History:  Procedure Laterality Date   ABDOMINAL SURGERY     COLONOSCOPY WITH PROPOFOL N/A 05/25/2021   Procedure: COLONOSCOPY WITH PROPOFOL;  Surgeon: Lin Landsman, MD;  Location: [facility] ENDOSCOPY;  Service: Gastroenterology;  Laterality: N/A;   JOINT REPLACEMENT     right knee   KNEE SURGERY Left     Social History   Socioeconomic History   Marital status: Single    Spouse name: [name]   Number of children: 3   Years of education: Not on file   Highest education level: Not on file  Occupational History   Not on file  Tobacco Use   Smoking status: Former   Smokeless tobacco: Current    Types: Snuff  Vaping Use   Vaping Use: Never used  Substance and Sexual Activity   Alcohol use: No   Drug use: No   Sexual activity: Yes  Other Topics Concern   Not on file  Social History Narrative   Live in private residence with spouse and mother in law   Social Determinants of Health   Financial Resource Strain: [place]  (08/12/2017)   Overall Financial Resource Strain (CARDIA)    Difficulty of Paying Living Expenses: Not hard at all  Food Insecurity: No Food Insecurity (08/12/2017)   Hunger Vital Sign    Worried About Running Out of Food in the Last Year: Never true    [place] in the Last Year: Never true  Transportation Needs: No Transportation Needs (08/12/2017)   PRAPARE - Hydrologist (Medical): No    Lack of Transportation (Non-Medical): No   Physical Activity: Sufficiently Active (08/12/2017)   Exercise Vital Sign    Days of Exercise per Week: 7 days    Minutes of Exercise per Session: 30 min  Stress: No Stress Concern Present (08/12/2017)   [place]    Feeling of Stress : Only a little  Social Connections: Somewhat Isolated (08/12/2017)   Social Connection and Isolation Panel [NHANES]    Frequency of Communication with Friends and Family: Once a week    Frequency of Social Gatherings with Friends and Family: Once a week    Attends Religious Services: More than 4 times per year    Active Member of [employer] or Organizations: No    Attends Archivist Meetings: Never    Marital Status: Married  Human resources officer Violence: Not At Risk (08/12/2017)   Humiliation, Afraid, Rape, and Kick questionnaire  Fear of Current or Ex-Partner: No    Emotionally Abused: No    Physically Abused: No    Sexually Abused: No    Family History  Problem Relation Age of Onset   Cancer Mother 4       Pancreatic   COPD Mother    Diabetes Mother    Hyperlipidemia Mother    Hypertension Mother    Cancer Maternal Uncle        pancreatic   Cancer Maternal Grandmother 84       colon   COPD Maternal Grandmother    Diabetes Maternal Grandmother    Hypertension Maternal Grandmother      Current Outpatient Medications:    acyclovir (ZOVIRAX) 400 MG tablet, Take 1 tablet (400 mg total) by mouth 2 (two) times daily., Disp: 60 tablet, Rfl: 11   amitriptyline (ELAVIL) 10 MG tablet, Take 1 tablet (10 mg total) by mouth at bedtime., Disp: 15 tablet, Rfl: 0   amLODipine (NORVASC) 10 MG tablet, Take 1 tablet (10 mg total) by mouth daily. For blood pressure., Disp: 90 tablet, Rfl: 0   aspirin EC 81 MG tablet, Take 81 mg by mouth daily., Disp: , Rfl:    baclofen (LIORESAL) 10 MG tablet, Take 1 tablet (10 mg total) by mouth 4 (four) times daily as needed for muscle spasms., Disp:  360 tablet, Rfl: 0   celecoxib (CELEBREX) 200 MG capsule, Take 1 capsule (200 mg total) by mouth daily., Disp: 30 capsule, Rfl: 2   cyanocobalamin (VITAMIN B12) 1000 MCG/ML injection, Inject 1 mL (1,000 mcg total) into the muscle every 30 (thirty) days., Disp: 1 mL, Rfl: 11   dexamethasone (DECADRON) 4 MG tablet, Take 5 tablets (20 mg total) by mouth 2 (two) times a week. Take first dose 1 hour prior to reporting to clinic for daratumumab. Take 2nd dose the day after daratumumab., Disp: 40 tablet, Rfl: 1   levothyroxine (SYNTHROID) 50 MCG tablet, Take 1 tablet (50 mcg total) by mouth daily before breakfast., Disp: 30 tablet, Rfl: 2   lidocaine-prilocaine (EMLA) cream, Apply to affected area once, Disp: 30 g, Rfl: 3   montelukast (SINGULAIR) 10 MG tablet, Take 1 tablet in the evening 1 day before your treatment. Day of treatment take 1 tablet in the morning for the first 2 treatments of Daratumumab, Disp: 4 tablet, Rfl: 0   OLANZapine (ZYPREXA) 10 MG tablet, Take 1 tablet (10 mg total) by mouth at bedtime. To help with nausea prevention., Disp: 30 tablet, Rfl: 1   olmesartan (BENICAR) 20 MG tablet, Take 1 tablet (20 mg total) by mouth daily. For blood pressure., Disp: 90 tablet, Rfl: 3   ondansetron (ZOFRAN) 8 MG tablet, Take 1 tablet (8 mg total) by mouth every 8 (eight) hours as needed for nausea or vomiting., Disp: 30 tablet, Rfl: 1   Oxycodone HCl 10 MG TABS, Take 1 tablet (10 mg total) by mouth every 8 (eight) hours as needed., Disp: 90 tablet, Rfl: 0   pomalidomide (POMALYST) 3 MG capsule, Take 1 capsule (3 mg total) by mouth daily. 1 capsule for 21 days,  days off.  Obtained 07/19/2022, REMS: [id number], Disp: 21 capsule, Rfl: 0   potassium chloride SA (KLOR-CON M) 20 MEQ tablet, Take 1 tablet (20 mEq total) by mouth daily., Disp: 21 tablet, Rfl: 0   pregabalin (LYRICA) 150 MG capsule, Take 1 capsule (150 mg total) by mouth 3 (three) times daily as needed (for pain)., Disp: 270 capsule, Rfl: 0  SUMAtriptan (IMITREX) 50 MG tablet, Take 1 tablet by mouth at migraine onset. May repeat in 2 hours if headache persists or recurs., Disp: 10 tablet, Rfl: 0   Syringe/Needle, Disp, (SYRINGE 3CC/25GX1") 25G X 1" 3 ML MISC, 1 Syringe by Does not apply route every 30 (thirty) days., Disp: 12 each, Rfl: 0   topiramate (TOPAMAX) 50 MG tablet, Take 1 tablet (50 mg total) by mouth at bedtime. For headache prevention., Disp: 90 tablet, Rfl: 2  Physical exam:  Vitals:   07/23/22 0848  BP: (!) 136/97  Pulse: 93  Temp: 97.7 F (36.5 C)  SpO2: 100%  Weight: 149 lb 14.4 oz (68 kg)  Height: 5\' 9"  (1.753 m)   Physical Exam Cardiovascular:     Rate and Rhythm: Normal rate and regular rhythm.     Heart sounds: Normal heart sounds.  Pulmonary:     Effort: Pulmonary effort is normal.     Breath sounds: Normal breath sounds.  Skin:    General: Skin is warm and dry.  Neurological:     Mental Status: He is alert and oriented to person, place, and time.         Latest Ref Rng & Units 07/16/2022    8:25 AM  CMP  Glucose 70 - 99 mg/dL 107   BUN 6 - 20 mg/dL 11   Creatinine 0.61 - 1.24 mg/dL 1.35   Sodium 135 - 145 mmol/L 137   Potassium 3.5 - 5.1 mmol/L 3.2   Chloride 98 - 111 mmol/L 105   CO2 22 - 32 mmol/L 26   Calcium 8.9 - 10.3 mg/dL 9.0   Total Protein 6.5 - 8.1 g/dL 6.5   Total Bilirubin 0.3 - 1.2 mg/dL 0.4   Alkaline Phos 38 - 126 U/L 113   AST 15 - 41 U/L 42   ALT 0 - 44 U/L 36       Latest Ref Rng & Units 07/23/2022    8:36 AM  CBC  WBC 4.0 - 10.5 K/uL 5.2   Hemoglobin 13.0 - 17.0 g/dL 17.1   Hematocrit 39.0 - 52.0 % 49.9   Platelets 150 - 400 K/uL 226     No images are attached to the encounter.  MR Knee Left  Wo Contrast  Result Date: 07/01/2022 CLINICAL DATA:  Knee pain with swelling and popping for 3 months. History of partial knee replacement. Recurrent multiple myeloma. Evaluate for neoplasm of bone. EXAM: MRI OF THE LEFT KNEE WITHOUT CONTRAST TECHNIQUE: Multiplanar,  multisequence MR imaging of the knee was performed. No intravenous contrast was administered. Protocol was adjusted to minimize artifact from the patient's lateral compartment arthroplasty. COMPARISON:  Radiographs 06/12/2022 and 08/25/2017. PET-CT 05/10/2022. FINDINGS: MENISCI Medial meniscus:  Intact with normal morphology. Lateral meniscus:  Status post lateral compartment arthroplasty. LIGAMENTS Cruciates: Partially obscured by artifact from the prior arthroplasty. Grossly intact. Collaterals: The medial collateral ligament appears normal. The lateral collateral ligament complex is grossly intact although partly obscured by artifact. CARTILAGE Patellofemoral: Mild chondral thinning without focal chondral defect. Medial:  Mild chondral thinning without focal defect. Lateral:  Status post lateral compartment arthroplasty. MISCELLANEOUS Joint:  Small joint effusion.  Mild synovial/capsular thickening. Popliteal Fossa: The popliteus muscle and tendon are intact. Tiny Baker's cyst. Extensor Mechanism: Patellar tendinosis or postsurgical change with mild overlying susceptibility artifact. No acute findings. There is scarring in Hoffa's fat. The quadriceps tendon appears unremarkable. Bones: There are small bone infarcts laterally in the distal femoral metaphysis and centrally in the  proximal tibial metaphysis. No findings suspicious for multiple myeloma. No evidence of acute fracture or dislocation. Other: No other significant periarticular soft tissue findings. IMPRESSION: 1. No evidence of multiple myeloma or acute osseous findings. Small bone infarcts in the distal femur and proximal tibia. 2. Small knee joint effusion with mild synovial/capsular thickening. 3. Previous lateral compartment arthroplasty. Mild medial and patellofemoral compartment chondral thinning without focal chondral defect. 4. Grossly intact medial meniscus, cruciate and collateral ligaments. Electronically Signed   By: Richardean Sale M.D.    On: 07/01/2022 13:55     Assessment and plan- Patient is a 52 y.o. male with history of kappa light chain multiple myeloma currently in relapse.  He is here for on treatment assessment prior to cycle 2-day 22 of Darzalex  Counts okay to proceed with cycle 2-day 22 of Darzalex today.  I will see him back for cycle 3-day 1 of Darzalex and following that I will be seeing him once a month.  Peripheral neuropathy: Patient has pre-existing peripheral neuropathy from his prior treatments.  States that his balance has gotten worse since starting the new treatment.  There are times when he has to hold onto support to prevent a fall.  I have asked him to reduce his dose of Pomalyst from 3 mg to 2 mg.  I will also have him seen by Dr. Mickeal Skinner from neuro-oncology.  He will continue with as needed oxycodone and Lyrica  Myeloma labs from today are pending.  He has started Darzalex Pomalyst regimen about 2 months ago..  To see a significant decrease in his kappa free light chains.  If levels keep increasing based on today's labs I will get in touch with Dr. Tracey Harries at [facility] for consideration of third line treatment possibly carfilzomib  Continue low-dose aspirin and acyclovir prophylaxis   Visit Diagnosis 1. High risk medication use   2. Multiple myeloma in relapse   3. Encounter for antineoplastic chemotherapy      Dr. Randa Evens, MD, MPH [facility] [id number] 07/23/2022 9:04 AM

## 2022-07-24 LAB — KAPPA/LAMBDA LIGHT CHAINS
Kappa free light chain: 646.8 mg/L — ABNORMAL HIGH (ref 3.3–19.4)
Kappa, lambda light chain ratio: 57.24 — ABNORMAL HIGH (ref 0.26–1.65)
Lambda free light chains: 11.3 mg/L (ref 5.7–26.3)

## 2022-07-25 ENCOUNTER — Other Ambulatory Visit: Payer: Self-pay | Admitting: Oncology

## 2022-07-25 ENCOUNTER — Encounter: Payer: Self-pay | Admitting: Oncology

## 2022-07-25 ENCOUNTER — Other Ambulatory Visit: Payer: Self-pay

## 2022-07-25 DIAGNOSIS — C9002 Multiple myeloma in relapse: Secondary | ICD-10-CM

## 2022-07-25 NOTE — Progress Notes (Signed)
DISCONTINUE ON PATHWAY REGIMEN - Multiple Myeloma and Other Plasma Cell Dyscrasias     Cycles 1 and 2: A cycle is every 28 days:     Pomalidomide      Dexamethasone      Daratumumab and hyaluronidase-fihj    Cycles 3 through 6: A cycle is every 28 days:     Pomalidomide      Dexamethasone      Dexamethasone      Daratumumab and hyaluronidase-fihj    Cycles 7 and beyond: A cycle is every 28 days:     Pomalidomide      Dexamethasone      Dexamethasone      Daratumumab and hyaluronidase-fihj   **Always confirm dose/schedule in your pharmacy ordering system**  REASON: Disease Progression PRIOR TREATMENT: [id number]: DaraPd - Subcutaneous Daratumumab (Daratumumab/hyaluronidase SUBQ + Pomalidomide + Dexamethasone) Until Progression or Unacceptable Toxicity TREATMENT RESPONSE: Progressive Disease (PD)  START ON PATHWAY REGIMEN - Multiple Myeloma and Other Plasma Cell Dyscrasias     A cycle is every 28 days:     Carfilzomib      Carfilzomib      Carfilzomib      Pomalidomide      Dexamethasone   **Always confirm dose/schedule in your pharmacy ordering system**  Patient Characteristics: Multiple Myeloma, Relapsed / Refractory, Second through Fourth Lines of Therapy, Fit or Candidate for Triplet Therapy, Lenalidomide-Refractory or Lenalidomide-based Regimen Not Preferred, Not a Candidate for Anti-CD38 Antibody Disease Classification: Multiple Myeloma R-ISS Staging: Unknown Therapeutic Status: Relapsed Line of Therapy: Third Line Anti-CD38 Antibody Candidacy: Not a Candidate for Anti-CD38 Antibody Lenalidomide-based Regimen Preference/Candidacy: Lenalidomide-Refractory Intent of Therapy: Non-Curative / Palliative Intent, Discussed with Patient

## 2022-07-26 LAB — MULTIPLE MYELOMA PANEL, SERUM
Albumin SerPl Elph-Mcnc: 3.9 g/dL (ref 2.9–4.4)
Albumin/Glob SerPl: 1.6 (ref 0.7–1.7)
Alpha 1: 0.3 g/dL (ref 0.0–0.4)
Alpha2 Glob SerPl Elph-Mcnc: 0.6 g/dL (ref 0.4–1.0)
B-Globulin SerPl Elph-Mcnc: 0.9 g/dL (ref 0.7–1.3)
Gamma Glob SerPl Elph-Mcnc: 0.8 g/dL (ref 0.4–1.8)
Globulin, Total: 2.5 g/dL (ref 2.2–3.9)
IgA: 99 mg/dL (ref 90–386)
IgG (Immunoglobin G), Serum: 789 mg/dL (ref 603–1613)
IgM (Immunoglobulin M), Srm: 63 mg/dL (ref 20–172)
M Protein SerPl Elph-Mcnc: 0.2 g/dL — ABNORMAL HIGH
Total Protein ELP: 6.4 g/dL (ref 6.0–8.5)

## 2022-07-29 MED FILL — Dexamethasone Sodium Phosphate Inj 100 MG/10ML: INTRAMUSCULAR | Qty: 2 | Status: AC

## 2022-07-30 ENCOUNTER — Other Ambulatory Visit: Payer: Self-pay

## 2022-07-30 ENCOUNTER — Inpatient Hospital Stay: Payer: BC Managed Care – PPO | Admitting: Oncology

## 2022-07-30 ENCOUNTER — Inpatient Hospital Stay: Payer: BC Managed Care – PPO

## 2022-07-30 ENCOUNTER — Encounter: Payer: Self-pay | Admitting: Oncology

## 2022-07-30 ENCOUNTER — Encounter: Payer: Self-pay | Admitting: *Deleted

## 2022-07-30 ENCOUNTER — Other Ambulatory Visit: Payer: Self-pay | Admitting: *Deleted

## 2022-07-30 ENCOUNTER — Inpatient Hospital Stay (HOSPITAL_BASED_OUTPATIENT_CLINIC_OR_DEPARTMENT_OTHER): Payer: BC Managed Care – PPO | Admitting: Oncology

## 2022-07-30 VITALS — BP 143/97 | HR 87 | Temp 98.4°F | Resp 16 | Wt 152.0 lb

## 2022-07-30 DIAGNOSIS — Z5111 Encounter for antineoplastic chemotherapy: Secondary | ICD-10-CM | POA: Diagnosis not present

## 2022-07-30 DIAGNOSIS — C9002 Multiple myeloma in relapse: Secondary | ICD-10-CM

## 2022-07-30 DIAGNOSIS — M545 Low back pain, unspecified: Secondary | ICD-10-CM | POA: Diagnosis not present

## 2022-07-30 DIAGNOSIS — Z87891 Personal history of nicotine dependence: Secondary | ICD-10-CM | POA: Diagnosis not present

## 2022-07-30 DIAGNOSIS — Z5112 Encounter for antineoplastic immunotherapy: Secondary | ICD-10-CM | POA: Diagnosis not present

## 2022-07-30 DIAGNOSIS — Z79899 Other long term (current) drug therapy: Secondary | ICD-10-CM

## 2022-07-30 DIAGNOSIS — Z7982 Long term (current) use of aspirin: Secondary | ICD-10-CM | POA: Diagnosis not present

## 2022-07-30 DIAGNOSIS — G620 Drug-induced polyneuropathy: Secondary | ICD-10-CM | POA: Diagnosis not present

## 2022-07-30 DIAGNOSIS — E876 Hypokalemia: Secondary | ICD-10-CM | POA: Diagnosis not present

## 2022-07-30 DIAGNOSIS — G8929 Other chronic pain: Secondary | ICD-10-CM | POA: Diagnosis not present

## 2022-07-30 DIAGNOSIS — C9001 Multiple myeloma in remission: Secondary | ICD-10-CM

## 2022-07-30 DIAGNOSIS — Z800 Family history of malignant neoplasm of digestive organs: Secondary | ICD-10-CM | POA: Diagnosis not present

## 2022-07-30 DIAGNOSIS — Z9484 Stem cells transplant status: Secondary | ICD-10-CM | POA: Diagnosis not present

## 2022-07-30 LAB — CMP (CANCER CENTER ONLY)
ALT: 28 U/L (ref 0–44)
AST: 28 U/L (ref 15–41)
Albumin: 4.1 g/dL (ref 3.5–5.0)
Alkaline Phosphatase: 126 U/L (ref 38–126)
Anion gap: 7 (ref 5–15)
BUN: 10 mg/dL (ref 6–20)
CO2: 26 mmol/L (ref 22–32)
Calcium: 9.3 mg/dL (ref 8.9–10.3)
Chloride: 106 mmol/L (ref 98–111)
Creatinine: 1.53 mg/dL — ABNORMAL HIGH (ref 0.61–1.24)
GFR, Estimated: 55 mL/min — ABNORMAL LOW (ref >60)
Glucose, Bld: 105 mg/dL — ABNORMAL HIGH (ref 70–99)
Potassium: 3.3 mmol/L — ABNORMAL LOW (ref 3.5–5.1)
Sodium: 139 mmol/L (ref 135–145)
Total Bilirubin: 0.7 mg/dL (ref 0.3–1.2)
Total Protein: 7.1 g/dL (ref 6.5–8.1)

## 2022-07-30 LAB — CBC WITH DIFFERENTIAL (CANCER CENTER ONLY)
Abs Immature Granulocytes: 0.02 10*3/uL (ref 0.00–0.07)
Basophils Absolute: 0 10*3/uL (ref 0.0–0.1)
Basophils Relative: 1 %
Eosinophils Absolute: 0.3 10*3/uL (ref 0.0–0.5)
Eosinophils Relative: 6 %
HCT: 47.5 % (ref 39.0–52.0)
Hemoglobin: 16.4 g/dL (ref 13.0–17.0)
Immature Granulocytes: 0 %
Lymphocytes Relative: 25 %
Lymphs Abs: 1.1 10*3/uL (ref 0.7–4.0)
MCH: 32 pg (ref 26.0–34.0)
MCHC: 34.5 g/dL (ref 30.0–36.0)
MCV: 92.6 fL (ref 80.0–100.0)
Monocytes Absolute: 0.3 10*3/uL (ref 0.1–1.0)
Monocytes Relative: 8 %
Neutro Abs: 2.7 10*3/uL (ref 1.7–7.7)
Neutrophils Relative %: 60 %
Platelet Count: 201 10*3/uL (ref 150–400)
RBC: 5.13 MIL/uL (ref 4.22–5.81)
RDW: 13.2 % (ref 11.5–15.5)
WBC Count: 4.5 10*3/uL (ref 4.0–10.5)
nRBC: 0 % (ref 0.0–0.2)

## 2022-07-30 MED ORDER — DEXTROSE 5 % IV SOLN
20.0000 mg/m2 | Freq: Once | INTRAVENOUS | Status: AC
  Administered 2022-07-30: 40 mg via INTRAVENOUS
  Filled 2022-07-30: qty 15

## 2022-07-30 MED ORDER — ACYCLOVIR 400 MG PO TABS
400.0000 mg | ORAL_TABLET | Freq: Two times a day (BID) | ORAL | 2 refills | Status: DC
  Filled 2022-07-30: qty 60, 30d supply, fill #0
  Filled 2022-10-14: qty 60, 30d supply, fill #1

## 2022-07-30 MED ORDER — SODIUM CHLORIDE 0.9 % IV SOLN
20.0000 mg | Freq: Once | INTRAVENOUS | Status: AC
  Administered 2022-07-30: 20 mg via INTRAVENOUS
  Filled 2022-07-30: qty 20

## 2022-07-30 MED ORDER — SODIUM CHLORIDE 0.9 % IV SOLN
Freq: Once | INTRAVENOUS | Status: DC
  Filled 2022-07-30: qty 250

## 2022-07-30 MED ORDER — SODIUM CHLORIDE 0.9 % IV SOLN
Freq: Once | INTRAVENOUS | Status: AC
  Filled 2022-07-30: qty 250

## 2022-07-30 MED ORDER — DEXAMETHASONE 4 MG PO TABS
20.0000 mg | ORAL_TABLET | ORAL | 2 refills | Status: DC
Start: 2022-07-30 — End: 2023-01-07
  Filled 2022-07-30: qty 20, 28d supply, fill #0

## 2022-07-30 MED ORDER — POTASSIUM CHLORIDE CRYS ER 20 MEQ PO TBCR
20.0000 meq | EXTENDED_RELEASE_TABLET | Freq: Every day | ORAL | 0 refills | Status: DC
  Filled 2022-07-30: qty 7, 7d supply, fill #0

## 2022-07-30 NOTE — Progress Notes (Signed)
Hematology/Oncology Consult note [facility]  [phone] [phone]  Patient Care Team: Doreene Nestlark, Katherine K, NP as PCP - General (Internal Medicine) Martin MajesticKang, Yubin, MD as PCP - Hematology/Oncology Creig Hinesao, Coye Dawood C, MD as Consulting Physician (Hematology and Oncology)   Name of the patient: James House  [id number]  09/24/1970   Date of visit: 07/30/22  Diagnosis- kappa light chain multiple myeloma in relapse    Chief complaint/ Reason for visit-on treatment assessment prior to cycle 1 of carfilzomib along with Pomalyst  Heme/Onc history:  Patient is a 52- year-old male with a history of kappa light chain myeloma that was treated by Dr. Greer PickerelKang [facility].  History is as follows:   1.  Patient presented with renal insufficiency with a creatinine of 2.4 in April 2018.  Kidney biopsy showed myeloma cast nephropathy.  Kappa light chain was 3004 and lambda 0.22 with a kappa lambda ratio of 13,000 655.  Skeletal survey showed multiple calvarial and marrow lesions.  Bone marrow biopsy in May 2018 showed 43% plasma cells.  He received CyBorD for cycle 1 in May 2018 and was subsequently switched to RVD.  He received 4 cycles followed by a repeat bone marrow biopsy in August 2018 which showed no increase in plasma cells.   2.  He underwent autologous stem cell transplantation on 01/30/2017.  He was started on Revlimid maintenance 10 mg daily in January 2019.   3.  [facility] from January February and March [facility] showed no M spike, normal kappa lambda ratio of 1.06.  He was last seen by them in April 2019.   4. Following that patient was admitted to [facility] for healthcare associated pneumonia and was recently discharged.  He wished to transfer his care to us at this time.  He was on maintenance Revlimid 5 mg which was then reduced to 2.5 mg.  Follows up with pain clinic for chemo-induced peripheral neuropathy with Dr. Laban EmperorNaveira.  [facility]  and therefore patient was discharged from pain clinic   5.  Noted to have gradually increasing free kappa light chainFrom 15 in 20 21-39.5 with a ratio of 3.09.  He was seen by [facility] again and underwent a repeat bone marrow biopsy which did not show any evidence of clonal plasma cells.  PET CT scan also did not show any new active lytic lesions in May 2023.Marland Kitchen.  Revlimid dose was increased to 15 mg 3 weeks on 1 week off but kappa light chains show continuing to increase   6.  PET CT scan in December 2023 did not show any evidence of active myeloma.  [facility] which showed 6% plasma cellsConsistent with persistent plasma cell neoplasm.  74 to 86% of isolated plasma cells show loss of T p53 and monosomy 13.  [name] and was recommended Darzalex Pomalyst dexamethasone regimen  7.  Disease progression after 8 weekly cycles of Darazalex kappa light chain increased from 332-646 with a ratio that went up from 39-57 concerning for disease progression.  Patient switched to carfilzomib Pomalyst dexamethasone regimen  Interval history-patient has baseline fatigue and peripheral neuropathy.  He is concerned if the present treatment will work for him or not.  ECOG PS- 1 Pain scale- 3 Opioid associated constipation- no  Review of systems- Review of Systems  Constitutional:  Positive for malaise/fatigue. Negative for chills, fever and weight loss.  HENT:  Negative for congestion, ear discharge and nosebleeds.   Eyes:  Negative for blurred vision.  Respiratory:  Negative for cough, hemoptysis, sputum production, shortness of breath and wheezing.   Cardiovascular:  Negative for chest pain, palpitations, orthopnea and claudication.  Gastrointestinal:  Negative for abdominal pain, blood in stool, constipation, diarrhea, heartburn, melena, nausea and vomiting.  Genitourinary:  Negative for dysuria, flank pain, frequency, hematuria and urgency.   Musculoskeletal:  Negative for back pain, joint pain and myalgias.  Skin:  Negative for rash.  Neurological:  Positive for sensory change (Peripheral neuropathy). Negative for dizziness, tingling, focal weakness, seizures, weakness and headaches.  Endo/Heme/Allergies:  Does not bruise/bleed easily.  Psychiatric/Behavioral:  Negative for depression and suicidal ideas. The patient does not have insomnia.       No Known Allergies   Past Medical History:  Diagnosis Date   CKD (chronic kidney disease) stage 3, GFR 30-59 ml/min    Hypertension    Multiple myeloma 08/11/2017   Neuropathy    Pneumonia    Severe sepsis 08/11/2017   Shingles 08/03/2019     Past Surgical History:  Procedure Laterality Date   ABDOMINAL SURGERY     COLONOSCOPY WITH PROPOFOL N/A 05/25/2021   Procedure: COLONOSCOPY WITH PROPOFOL;  Surgeon: Toney Reil, MD;  Location: [facility] ENDOSCOPY;  Service: Gastroenterology;  Laterality: N/A;   JOINT REPLACEMENT     right knee   KNEE SURGERY Left     Social History   Socioeconomic History   Marital status: Single    Spouse name: [name]   Number of children: 3   Years of education: Not on file   Highest education level: Not on file  Occupational History   Not on file  Tobacco Use   Smoking status: Former   Smokeless tobacco: Current    Types: Snuff  Vaping Use   Vaping Use: Never used  Substance and Sexual Activity   Alcohol use: No   Drug use: No   Sexual activity: Yes  Other Topics Concern   Not on file  Social History Narrative   Live in private residence with spouse and mother in law   Social Determinants of Health   Financial Resource Strain: Low Risk  (08/12/2017)   Overall Financial Resource Strain (CARDIA)    Difficulty of Paying Living Expenses: Not hard at all  Food Insecurity: No Food Insecurity (08/12/2017)   Hunger Vital Sign    Worried About Running Out of Food in the Last Year: Never true    Ran Out of Food in the Last Year: Never  true  Transportation Needs: No Transportation Needs (08/12/2017)   PRAPARE - Administrator, Civil Service (Medical): No    Lack of Transportation (Non-Medical): No  Physical Activity: Sufficiently Active (08/12/2017)   Exercise Vital Sign    Days of Exercise per Week: 7 days    Minutes of Exercise per Session: 30 min  Stress: No Stress Concern Present (08/12/2017)   [employer] of Occupational Health - Occupational Stress Questionnaire    Feeling of Stress : Only a little  Social Connections: Somewhat Isolated (08/12/2017)   Social Connection and Isolation Panel [NHANES]    Frequency of Communication with Friends and Family: Once a week    Frequency of Social Gatherings with Friends and Family: Once a week    Attends Religious Services: More than 4 times per year    Active Member of [employer] or Organizations: No    Attends Banker  Meetings: Never    Marital Status: Married  Catering manager Violence: Not At Risk (08/12/2017)   Humiliation, Afraid, Rape, and Kick questionnaire    Fear of Current or Ex-Partner: No    Emotionally Abused: No    Physically Abused: No    Sexually Abused: No    Family History  Problem Relation Age of Onset   Cancer Mother 37       Pancreatic   COPD Mother    Diabetes Mother    Hyperlipidemia Mother    Hypertension Mother    Cancer Maternal Uncle        pancreatic   Cancer Maternal Grandmother 27       colon   COPD Maternal Grandmother    Diabetes Maternal Grandmother    Hypertension Maternal Grandmother      Current Outpatient Medications:    amitriptyline (ELAVIL) 10 MG tablet, Take 1 tablet (10 mg total) by mouth at bedtime., Disp: 15 tablet, Rfl: 0   amLODipine (NORVASC) 10 MG tablet, Take 1 tablet (10 mg total) by mouth daily. For blood pressure., Disp: 90 tablet, Rfl: 0   aspirin EC 81 MG tablet, Take 81 mg by mouth daily., Disp: , Rfl:    baclofen (LIORESAL) 10 MG tablet, Take 1 tablet (10 mg total) by mouth 4  (four) times daily as needed for muscle spasms., Disp: 360 tablet, Rfl: 0   celecoxib (CELEBREX) 200 MG capsule, Take 1 capsule (200 mg total) by mouth daily., Disp: 30 capsule, Rfl: 2   cyanocobalamin (VITAMIN B12) 1000 MCG/ML injection, Inject 1 mL (1,000 mcg total) into the muscle every 30 (thirty) days., Disp: 1 mL, Rfl: 11   levothyroxine (SYNTHROID) 50 MCG tablet, Take 1 tablet (50 mcg total) by mouth daily before breakfast., Disp: 30 tablet, Rfl: 2   montelukast (SINGULAIR) 10 MG tablet, Take 1 tablet in the evening 1 day before your treatment. Day of treatment take 1 tablet in the morning for the first 2 treatments of Daratumumab, Disp: 4 tablet, Rfl: 0   OLANZapine (ZYPREXA) 10 MG tablet, Take 1 tablet (10 mg total) by mouth at bedtime. To help with nausea prevention., Disp: 30 tablet, Rfl: 1   olmesartan (BENICAR) 20 MG tablet, Take 1 tablet (20 mg total) by mouth daily. For blood pressure., Disp: 90 tablet, Rfl: 3   Oxycodone HCl 10 MG TABS, Take 1 tablet (10 mg total) by mouth every 8 (eight) hours as needed., Disp: 90 tablet, Rfl: 0   pomalidomide (POMALYST) 2 MG capsule, Take 1 capsule (2 mg total) by mouth daily. Take for 21 days, then hold for 7 days. Repeat every 28 days., Disp: 21 capsule, Rfl: 0   potassium chloride SA (KLOR-CON M) 20 MEQ tablet, Take 1 tablet (20 mEq total) by mouth daily., Disp: 21 tablet, Rfl: 0   pregabalin (LYRICA) 150 MG capsule, Take 1 capsule (150 mg total) by mouth 3 (three) times daily as needed (for pain)., Disp: 270 capsule, Rfl: 0   SUMAtriptan (IMITREX) 50 MG tablet, Take 1 tablet by mouth at migraine onset. May repeat in 2 hours if headache persists or recurs., Disp: 10 tablet, Rfl: 0   Syringe/Needle, Disp, (SYRINGE 3CC/25GX1") 25G X 1" 3 ML MISC, 1 Syringe by Does not apply route every 30 (thirty) days., Disp: 12 each, Rfl: 0   topiramate (TOPAMAX) 50 MG tablet, Take 1 tablet (50 mg total) by mouth at bedtime. For headache prevention., Disp: 90  tablet, Rfl: 2   dexamethasone (DECADRON) 4  MG tablet, Take 5 tablets (20 mg total) by mouth 2 (two) times a week. Take first dose 1 hour prior to reporting to clinic for daratumumab. Take 2nd dose the day after daratumumab. (Patient not taking: Reported on 07/30/2022), Disp: 40 tablet, Rfl: 1  Physical exam:  Vitals:   07/30/22 0849 07/30/22 0858  BP: (!) 153/97 (!) 143/97  Pulse: 87   Resp: 16   Temp: 98.4 F (36.9 C)   TempSrc: Tympanic   SpO2: 100%   Weight: 152 lb (68.9 kg)    Physical Exam Cardiovascular:     Rate and Rhythm: Normal rate and regular rhythm.     Heart sounds: Normal heart sounds.  Pulmonary:     Effort: Pulmonary effort is normal.     Breath sounds: Normal breath sounds.  Skin:    General: Skin is warm and dry.  Neurological:     Mental Status: He is alert and oriented to person, place, and time.         Latest Ref Rng & Units 07/30/2022    8:25 AM  CMP  Glucose 70 - 99 mg/dL 161   BUN 6 - 20 mg/dL 10   Creatinine 0.96 - 1.24 mg/dL 0.45   Sodium 409 - 811 mmol/L 139   Potassium 3.5 - 5.1 mmol/L 3.3   Chloride 98 - 111 mmol/L 106   CO2 22 - 32 mmol/L 26   Calcium 8.9 - 10.3 mg/dL 9.3   Total Protein 6.5 - 8.1 g/dL 7.1   Total Bilirubin 0.3 - 1.2 mg/dL 0.7   Alkaline Phos 38 - 126 U/L 126   AST 15 - 41 U/L 28   ALT 0 - 44 U/L 28       Latest Ref Rng & Units 07/30/2022    8:25 AM  CBC  WBC 4.0 - 10.5 K/uL 4.5   Hemoglobin 13.0 - 17.0 g/dL 91.4   Hematocrit 78.2 - 52.0 % 47.5   Platelets 150 - 400 K/uL 201      Assessment and plan- Patient is a 52 y.o. male  with history of kappa light chain multiple myeloma currently in relapse.  He is here for on treatment assessment prior to cycle 1 day 1 of carfilzomib  Despite 8-weekly doses of Darzalex serum free kappa light chain had continued to trend up.  It was 205 in December 2023 went up to 369 in March 2024 and then 646 in April 2024.  Serum free light chain ratio increased from 11 in December 20  23-34 in March 2024 and 57 in April 2024.  I have discussed this case with Dr. Pandora Leiter and I have switched him to carfilzomib Pomalyst dexamethasone regimen.  He will receive cycle 1 day 1 of carfilzomib at 20 mg/m today and if tolerated we will increase it to 27 mg/m 3 weeks on and 1 week off.  Discussed risks and benefits of carfilzomib including all but not limited to nausea, vomiting, low blood counts, risk of infections and hospitalization.  Risk of hypertension peripheral anemia and cardiac arrhythmias associated with carfilzomib.  Treatment will be given with a palliative intent.  Patient understands and agrees to proceed as planned.  He will start taking Pomalyst today at 2 mg 3 weeks on and 1 week off.  He was not able to tolerate 3 mg dosing.  We will plan for port placement next week and today he will get his treatments through peripheral IV.  Patient will be started on acyclovir  prophylaxis again along with aspirin.  Patient will also remain on dexamethasone 20 mg weekly.  Hypokalemia: Will start him on 20 mEq of oral potassium for a week  I will see him back in 2 weeks with labs   Visit Diagnosis 1. High risk medication use   2. Multiple myeloma in relapse   3. Encounter for antineoplastic chemotherapy       Dr. Owens Shark, MD, MPH [facility] [phone] 07/30/2022 9:23 AM              \

## 2022-07-30 NOTE — Progress Notes (Signed)
Pt in for follow up, reports is suppose to start new IV chemo today and has concerns and is unsure if he wants to proceed.   Pt BP elevated, pt reports is his normal especially on treatment and MD visits. Reviewed medication regime.

## 2022-07-30 NOTE — Patient Instructions (Signed)
[facility]  Discharge Instructions: Thank you for choosing [facility] to provide your oncology and hematology care.  If you have a lab appointment with the Cancer Center, please go directly to the Cancer Center and check in at the registration area.  Wear comfortable clothing and clothing appropriate for easy access to any Portacath or PICC line.   We strive to give you quality time with your provider. You may need to reschedule your appointment if you arrive late (15 or more minutes).  Arriving late affects you and other patients whose appointments are after yours.  Also, if you miss three or more appointments without notifying the office, you may be dismissed from the clinic at the provider's discretion.      For prescription refill requests, have your pharmacy contact our office and allow 72 hours for refills to be completed.    Today you received the following chemotherapy and/or immunotherapy agents Kyprolis      To help prevent nausea and vomiting after your treatment, we encourage you to take your nausea medication as directed.  BELOW ARE SYMPTOMS THAT SHOULD BE REPORTED IMMEDIATELY: *FEVER GREATER THAN 100.4 F (38 C) OR HIGHER *CHILLS OR SWEATING *NAUSEA AND VOMITING THAT IS NOT CONTROLLED WITH YOUR NAUSEA MEDICATION *UNUSUAL SHORTNESS OF BREATH *UNUSUAL BRUISING OR BLEEDING *URINARY PROBLEMS (pain or burning when urinating, or frequent urination) *BOWEL PROBLEMS (unusual diarrhea, constipation, pain near the anus) TENDERNESS IN MOUTH AND THROAT WITH OR WITHOUT PRESENCE OF ULCERS (sore throat, sores in mouth, or a toothache) UNUSUAL RASH, SWELLING OR PAIN  UNUSUAL VAGINAL DISCHARGE OR ITCHING   Items with * indicate a potential emergency and should be followed up as soon as possible or go to the Emergency Department if any problems should occur.  Please show the CHEMOTHERAPY ALERT CARD or IMMUNOTHERAPY ALERT CARD at check-in to  the Emergency Department and triage nurse.  Should you have questions after your visit or need to cancel or reschedule your appointment, please contact [facility]  [phone] and follow the prompts.  Office hours are 8:00 a.m. to 4:30 p.m. Monday - Friday. Please note that voicemails left after 4:00 p.m. may not be returned until the following business day.  We are closed weekends and major holidays. You have access to a nurse at all times for urgent questions. Please call the main number to the clinic [phone] and follow the prompts.  For any non-urgent questions, you may also contact your provider using MyChart. We now offer e-Visits for anyone 18 and older to request care online for non-urgent symptoms. For details visit [email or web address].   Also download the MyChart app! Go to the app store, search "MyChart", open the app, select [facility], and log in with your MyChart username and password.    

## 2022-07-30 NOTE — Progress Notes (Signed)
acyclovir

## 2022-07-31 ENCOUNTER — Encounter: Payer: Self-pay | Admitting: *Deleted

## 2022-08-01 ENCOUNTER — Other Ambulatory Visit: Payer: Self-pay | Admitting: Radiology

## 2022-08-01 NOTE — Progress Notes (Signed)
Patient for IR Port Insertion on Fri 08/02/2022, I called LVM for the patient on the phone and gave pre-procedure instructions. VM made aware to be here at 12:30p, NPO after MN prior to procedure as well as driver post procedure/recovery/discharge. Called 08/01/2022

## 2022-08-02 ENCOUNTER — Encounter: Payer: Self-pay | Admitting: Radiology

## 2022-08-02 ENCOUNTER — Ambulatory Visit
Admission: RE | Admit: 2022-08-02 | Discharge: 2022-08-02 | Disposition: A | Discharge status: Home / Self Care | Payer: BC Managed Care – PPO | Source: Ambulatory Visit | Attending: Oncology | Admitting: Oncology

## 2022-08-02 ENCOUNTER — Other Ambulatory Visit: Payer: Self-pay

## 2022-08-02 DIAGNOSIS — N183 Chronic kidney disease, stage 3 unspecified: Secondary | ICD-10-CM | POA: Insufficient documentation

## 2022-08-02 DIAGNOSIS — C9000 Multiple myeloma not having achieved remission: Secondary | ICD-10-CM | POA: Insufficient documentation

## 2022-08-02 DIAGNOSIS — Z452 Encounter for adjustment and management of vascular access device: Secondary | ICD-10-CM | POA: Diagnosis not present

## 2022-08-02 DIAGNOSIS — F17290 Nicotine dependence, other tobacco product, uncomplicated: Secondary | ICD-10-CM | POA: Insufficient documentation

## 2022-08-02 DIAGNOSIS — C9002 Multiple myeloma in relapse: Secondary | ICD-10-CM

## 2022-08-02 DIAGNOSIS — I129 Hypertensive chronic kidney disease with stage 1 through stage 4 chronic kidney disease, or unspecified chronic kidney disease: Secondary | ICD-10-CM | POA: Insufficient documentation

## 2022-08-02 HISTORY — PX: IR IMAGING GUIDED PORT INSERTION: IMG5740

## 2022-08-02 MED ORDER — MIDAZOLAM HCL 2 MG/2ML IJ SOLN
INTRAMUSCULAR | Status: AC | PRN
  Administered 2022-08-02 (×2): 1 mg via INTRAVENOUS

## 2022-08-02 MED ORDER — FENTANYL CITRATE (PF) 100 MCG/2ML IJ SOLN
INTRAMUSCULAR | Status: AC | PRN
  Administered 2022-08-02 (×2): 50 ug via INTRAVENOUS

## 2022-08-02 MED ORDER — MIDAZOLAM HCL 2 MG/2ML IJ SOLN
INTRAMUSCULAR | Status: AC
  Filled 2022-08-02: qty 2

## 2022-08-02 MED ORDER — HEPARIN SOD (PORK) LOCK FLUSH 100 UNIT/ML IV SOLN
500.0000 [IU] | Freq: Once | INTRAVENOUS | Status: AC
  Administered 2022-08-02: 500 [IU] via INTRAVENOUS

## 2022-08-02 MED ORDER — LIDOCAINE-EPINEPHRINE 1 %-1:100000 IJ SOLN
16.0000 mL | Freq: Once | INTRAMUSCULAR | Status: AC
  Administered 2022-08-02: 16 mL via INTRADERMAL

## 2022-08-02 MED ORDER — FENTANYL CITRATE (PF) 100 MCG/2ML IJ SOLN
INTRAMUSCULAR | Status: AC
  Filled 2022-08-02: qty 2

## 2022-08-02 MED ORDER — HEPARIN SOD (PORK) LOCK FLUSH 100 UNIT/ML IV SOLN
INTRAVENOUS | Status: AC
  Filled 2022-08-02: qty 5

## 2022-08-02 MED ORDER — LIDOCAINE-EPINEPHRINE 1 %-1:100000 IJ SOLN
INTRAMUSCULAR | Status: AC
  Filled 2022-08-02: qty 1

## 2022-08-02 MED ORDER — SODIUM CHLORIDE 0.9 % IV SOLN: INTRAVENOUS | Status: DC

## 2022-08-02 MED ORDER — MIDAZOLAM HCL 5 MG/5ML IJ SOLN
INTRAMUSCULAR | Status: AC | PRN
  Administered 2022-08-02: 1 mg via INTRAVENOUS

## 2022-08-02 NOTE — Procedures (Signed)
Interventional Radiology Procedure Note  Procedure: rt IJ POWER PORT    Complications: None  Estimated Blood Loss:  MIN  Findings: TIP SVCRA    Sharen Counter, MD

## 2022-08-02 NOTE — H&P (Signed)
Chief Complaint: Patient was seen in consultation today for multiple myeloma  Referring Physician(s): Rao,Archana C  Supervising Physician: Ruel Favors  Patient Status: [facility] - Out-pt  History of Present Illness: James House. is a 52 y.o. male with PMH significant for CKD and hypertension being seen today in relation to multiple myeloma. The patient has been followed in the past by Dr Pandora Leiter from [facility], as well as Dr Smith Robert with [facility]. Patient has had their serum free kappa light chains continuing to trend upwards despite treatment with Darzalex. Patient subsequently referred to IR for image-guided port placement to facilitate chemotherapy.  Past Medical History:  Diagnosis Date   CKD (chronic kidney disease) stage 3, GFR 30-59 ml/min    Hypertension    Multiple myeloma 08/11/2017   Neuropathy    Pneumonia    Severe sepsis 08/11/2017   Shingles 08/03/2019    Past Surgical History:  Procedure Laterality Date   ABDOMINAL SURGERY     COLONOSCOPY WITH PROPOFOL N/A 05/25/2021   Procedure: COLONOSCOPY WITH PROPOFOL;  Surgeon: Toney Reil, MD;  Location: [facility] ENDOSCOPY;  Service: Gastroenterology;  Laterality: N/A;   JOINT REPLACEMENT     right knee   KNEE SURGERY Left     Allergies: Patient has no known allergies.  Medications: Prior to Admission medications   Medication Sig Start Date End Date Taking? Authorizing Provider  acyclovir (ZOVIRAX) 400 MG tablet Take 1 tablet (400 mg total) by mouth 2 (two) times daily. 07/30/22   Creig Hines, MD  amitriptyline (ELAVIL) 10 MG tablet Take 1 tablet (10 mg total) by mouth at bedtime. 07/16/22   Borders, Daryl Eastern, NP  amLODipine (NORVASC) 10 MG tablet Take 1 tablet (10 mg total) by mouth daily. For blood pressure. 05/01/21   Doreene Nest, NP  aspirin EC 81 MG tablet Take 81 mg by mouth daily.    [provider]  baclofen (LIORESAL) 10 MG tablet Take 1 tablet (10 mg total) by mouth 4 (four) times daily as  needed for muscle spasms. 03/27/22 03/27/23  Doreene Nest, NP  celecoxib (CELEBREX) 200 MG capsule Take 1 capsule (200 mg total) by mouth daily. 06/17/22   Copland, Karleen Hampshire, MD  cyanocobalamin (VITAMIN B12) 1000 MCG/ML injection Inject 1 mL (1,000 mcg total) into the muscle every 30 (thirty) days. 01/01/22   Creig Hines, MD  dexamethasone (DECADRON) 4 MG tablet Take 5 tablets (20 mg total) by mouth once a week. Take first dose 1 hour prior to reporting to clinic for daratumumab. Take 2nd dose the day after daratumumab. 07/30/22   Creig Hines, MD  levothyroxine (SYNTHROID) 50 MCG tablet Take 1 tablet (50 mcg total) by mouth daily before breakfast. 07/16/22   Creig Hines, MD  montelukast (SINGULAIR) 10 MG tablet Take 1 tablet in the evening 1 day before your treatment. Day of treatment take 1 tablet in the morning for the first 2 treatments of Daratumumab 05/29/22   Creig Hines, MD  OLANZapine (ZYPREXA) 10 MG tablet Take 1 tablet (10 mg total) by mouth at bedtime. To help with nausea prevention. 06/05/22   Creig Hines, MD  olmesartan (BENICAR) 20 MG tablet Take 1 tablet (20 mg total) by mouth daily. For blood pressure. 06/08/21   Doreene Nest, NP  Oxycodone HCl 10 MG TABS Take 1 tablet (10 mg total) by mouth every 8 (eight) hours as needed. 07/23/22   Creig Hines, MD  pomalidomide (POMALYST) 2  MG capsule Take 1 capsule (2 mg total) by mouth daily. Take for 21 days, then hold for 7 days. Repeat every 28 days. 07/23/22   Creig Hines, MD  potassium chloride SA (KLOR-CON M) 20 MEQ tablet Take 1 tablet (20 mEq total) by mouth daily. 07/30/22   Creig Hines, MD  pregabalin (LYRICA) 150 MG capsule Take 1 capsule (150 mg total) by mouth 3 (three) times daily as needed (for pain). 07/16/22 01/12/23  Doreene Nest, NP  SUMAtriptan (IMITREX) 50 MG tablet Take 1 tablet by mouth at migraine onset. May repeat in 2 hours if headache persists or recurs. 06/08/21   Doreene Nest, NP   Syringe/Needle, Disp, (SYRINGE 3CC/25GX1") 25G X 1" 3 ML MISC 1 Syringe by Does not apply route every 30 (thirty) days. 01/01/22   Creig Hines, MD  topiramate (TOPAMAX) 50 MG tablet Take 1 tablet (50 mg total) by mouth at bedtime. For headache prevention. 01/16/22   Doreene Nest, NP     Family History  Problem Relation Age of Onset   Cancer Mother 87       Pancreatic   COPD Mother    Diabetes Mother    Hyperlipidemia Mother    Hypertension Mother    Cancer Maternal Uncle        pancreatic   Cancer Maternal Grandmother 55       colon   COPD Maternal Grandmother    Diabetes Maternal Grandmother    Hypertension Maternal Grandmother     Social History   Socioeconomic History   Marital status: Single    Spouse name: [name]   Number of children: 3   Years of education: Not on file   Highest education level: Not on file  Occupational History   Not on file  Tobacco Use   Smoking status: Former   Smokeless tobacco: Current    Types: Snuff  Vaping Use   Vaping Use: Never used  Substance and Sexual Activity   Alcohol use: No   Drug use: No   Sexual activity: Yes  Other Topics Concern   Not on file  Social History Narrative   Live in private residence with spouse and mother in law   Social Determinants of Health   Financial Resource Strain: Low Risk  (08/12/2017)   Overall Financial Resource Strain (CARDIA)    Difficulty of Paying Living Expenses: Not hard at all  Food Insecurity: No Food Insecurity (08/12/2017)   Hunger Vital Sign    Worried About Running Out of Food in the Last Year: Never true    Ran Out of Food in the Last Year: Never true  Transportation Needs: No Transportation Needs (08/12/2017)   PRAPARE - Administrator, Civil Service (Medical): No    Lack of Transportation (Non-Medical): No  Physical Activity: Sufficiently Active (08/12/2017)   Exercise Vital Sign    Days of Exercise per Week: 7 days    Minutes of Exercise per Session: 30  min  Stress: No Stress Concern Present (08/12/2017)   [employer] of Occupational Health - Occupational Stress Questionnaire    Feeling of Stress : Only a little  Social Connections: Somewhat Isolated (08/12/2017)   Social Connection and Isolation Panel [NHANES]    Frequency of Communication with Friends and Family: Once a week    Frequency of Social Gatherings with Friends and Family: Once a week    Attends Religious Services: More than 4 times per year  Active Member of Clubs or Organizations: No    Attends Banker Meetings: Never    Marital Status: Married    Code Status: Full Code  Review of Systems: A 12 point ROS discussed and pertinent positives are indicated in the HPI above.  All other systems are negative.  Review of Systems  Constitutional:  Negative for chills and fever.  Respiratory:  Negative for chest tightness and shortness of breath.   Cardiovascular:  Negative for chest pain and leg swelling.  Gastrointestinal:  Negative for abdominal pain, diarrhea, nausea and vomiting.  Neurological:  Negative for dizziness and headaches.  Psychiatric/Behavioral:  Negative for confusion.     Vital Signs: BP (!) 165/110   Pulse 62   Temp 97.9 F (36.6 C) (Oral)   Resp 18   Ht  (1.753 m)   Wt 145 lb (65.8 kg)   SpO2 98%   BMI 21.41 kg/m      Physical Exam Vitals reviewed.  HENT:     Mouth/Throat:     Mouth: Mucous membranes are moist.  Eyes:     Pupils: Pupils are equal, round, and reactive to light.  Cardiovascular:     Rate and Rhythm: Normal rate and regular rhythm.     Pulses: Normal pulses.     Heart sounds: Normal heart sounds.  Pulmonary:     Effort: Pulmonary effort is normal.     Breath sounds: Normal breath sounds.  Abdominal:     General: Bowel sounds are normal.     Palpations: Abdomen is soft.     Tenderness: There is no abdominal tenderness.  Musculoskeletal:     Right lower leg: No edema.     Left lower leg: No  edema.  Skin:    General: Skin is warm and dry.  Neurological:     Mental Status: He is alert and oriented to person, place, and time.  Psychiatric:        Mood and Affect: Mood normal.        Behavior: Behavior normal.        Thought Content: Thought content normal.        Judgment: Judgment normal.     Imaging: No results found.  Labs:  CBC: Recent Labs    07/09/22 0828 07/16/22 0825 07/23/22 0836 07/30/22 0825  WBC 4.0 4.2 5.2 4.5  HGB 15.2 15.3 17.1* 16.4  HCT 43.6 44.6 49.9 47.5  PLT 186 185 226 201    COAGS: No results for input(s): "INR", "APTT" in the last 8760 hours.  BMP: Recent Labs    07/09/22 0828 07/16/22 0825 07/23/22 0836 07/30/22 0825  NA 138 137 136 139  K 3.3* 3.2* 3.4* 3.3*  CL 106 105 104 106  CO2 GLUCOSE 59* 107* 115* 105*  BUN CALCIUM 8.9 9.0 9.6 9.3  CREATININE 1.32* 1.35* 1.48* 1.53*  GFRNONAA >60 >60 57* 55*    LIVER FUNCTION TESTS: Recent Labs    07/09/22 0828 07/16/22 0825 07/23/22 0836 07/30/22 0825  BILITOT 0.4 0.4 0.7 0.7  AST 24 42* 27 28  ALT 23 36 29 28  ALKPHOS 121 113 130* 126  PROT 6.4* 6.5 7.3 7.1  ALBUMIN 3.9 3.8 4.3 4.1    TUMOR MARKERS: No results for input(s): "AFPTM", "CEA", "CA199", "CHROMGRNA" in the last 8760 hours.  Assessment and Plan:  James House is a 52 yo male with recurrent multiple myeloma. Patient is under  the care of Dr Smith Robert and is undergoing chemotherapy under her care. Patient has been referred to IR for image-guided port placement. Case has been reviewed with Dr Miles Costain. Patient is NPO and presents today in their usual state of health.  Risks and benefits of image guided port-a-catheter placement was discussed with the patient including, but not limited to bleeding, infection, pneumothorax, or fibrin sheath development and need for additional procedures.  All of the patient's questions were answered, patient is agreeable to proceed. Consent signed and in  chart.   Thank you for this interesting consult.  I greatly enjoyed meeting James House. and look forward to participating in their care.  A copy of this report was sent to the requesting provider on this date.  Electronically Signed: Kennieth Francois, PA-C 08/02/2022, 1:03 PM   I spent a total of  30 Minutes   in face to face in clinical consultation, greater than 50% of which was counseling/coordinating care for multiple myeloma

## 2022-08-02 NOTE — Progress Notes (Signed)
Patient clinically stable post IR Port placement per Dr Miles Costain, tolerated well. Vitals stable pre and post procedure. Received Versed 3 mg along with Fentanyl 100 mcg IV for procedure. Report given to [facility] post procedure 332.

## 2022-08-06 ENCOUNTER — Other Ambulatory Visit: Payer: Self-pay

## 2022-08-06 ENCOUNTER — Encounter: Payer: Self-pay | Admitting: Oncology

## 2022-08-06 ENCOUNTER — Inpatient Hospital Stay: Payer: BC Managed Care – PPO

## 2022-08-06 ENCOUNTER — Telehealth: Payer: Self-pay | Admitting: *Deleted

## 2022-08-06 ENCOUNTER — Other Ambulatory Visit: Payer: Self-pay | Admitting: Oncology

## 2022-08-06 VITALS — BP 141/89 | HR 75 | Temp 96.5°F | Resp 18 | Ht 69.0 in | Wt 149.9 lb

## 2022-08-06 DIAGNOSIS — M545 Low back pain, unspecified: Secondary | ICD-10-CM | POA: Diagnosis not present

## 2022-08-06 DIAGNOSIS — Z87891 Personal history of nicotine dependence: Secondary | ICD-10-CM | POA: Diagnosis not present

## 2022-08-06 DIAGNOSIS — Z9481 Bone marrow transplant status: Secondary | ICD-10-CM

## 2022-08-06 DIAGNOSIS — Z9484 Stem cells transplant status: Secondary | ICD-10-CM | POA: Diagnosis not present

## 2022-08-06 DIAGNOSIS — Z800 Family history of malignant neoplasm of digestive organs: Secondary | ICD-10-CM | POA: Diagnosis not present

## 2022-08-06 DIAGNOSIS — Z79899 Other long term (current) drug therapy: Secondary | ICD-10-CM | POA: Diagnosis not present

## 2022-08-06 DIAGNOSIS — E876 Hypokalemia: Secondary | ICD-10-CM | POA: Diagnosis not present

## 2022-08-06 DIAGNOSIS — C9002 Multiple myeloma in relapse: Secondary | ICD-10-CM | POA: Diagnosis not present

## 2022-08-06 DIAGNOSIS — Z299 Encounter for prophylactic measures, unspecified: Secondary | ICD-10-CM

## 2022-08-06 DIAGNOSIS — R7989 Other specified abnormal findings of blood chemistry: Secondary | ICD-10-CM

## 2022-08-06 DIAGNOSIS — Z5112 Encounter for antineoplastic immunotherapy: Secondary | ICD-10-CM | POA: Diagnosis not present

## 2022-08-06 DIAGNOSIS — G8929 Other chronic pain: Secondary | ICD-10-CM | POA: Diagnosis not present

## 2022-08-06 DIAGNOSIS — Z7982 Long term (current) use of aspirin: Secondary | ICD-10-CM | POA: Diagnosis not present

## 2022-08-06 DIAGNOSIS — C9001 Multiple myeloma in remission: Secondary | ICD-10-CM

## 2022-08-06 DIAGNOSIS — G620 Drug-induced polyneuropathy: Secondary | ICD-10-CM | POA: Diagnosis not present

## 2022-08-06 LAB — CMP (CANCER CENTER ONLY)
ALT: 23 U/L (ref 0–44)
AST: 29 U/L (ref 15–41)
Albumin: 3.9 g/dL (ref 3.5–5.0)
Alkaline Phosphatase: 115 U/L (ref 38–126)
Anion gap: 6 (ref 5–15)
BUN: 18 mg/dL (ref 6–20)
CO2: 22 mmol/L (ref 22–32)
Calcium: 8.9 mg/dL (ref 8.9–10.3)
Chloride: 107 mmol/L (ref 98–111)
Creatinine: 1.37 mg/dL — ABNORMAL HIGH (ref 0.61–1.24)
GFR, Estimated: >60 mL/min (ref >60)
Glucose, Bld: 163 mg/dL — ABNORMAL HIGH (ref 70–99)
Potassium: 3.2 mmol/L — ABNORMAL LOW (ref 3.5–5.1)
Sodium: 135 mmol/L (ref 135–145)
Total Bilirubin: 0.9 mg/dL (ref 0.3–1.2)
Total Protein: 6.5 g/dL (ref 6.5–8.1)

## 2022-08-06 LAB — CBC WITH DIFFERENTIAL (CANCER CENTER ONLY)
Abs Immature Granulocytes: 0.02 10*3/uL (ref 0.00–0.07)
Basophils Absolute: 0 10*3/uL (ref 0.0–0.1)
Basophils Relative: 0 %
Eosinophils Absolute: 0.2 10*3/uL (ref 0.0–0.5)
Eosinophils Relative: 3 %
HCT: 46.6 % (ref 39.0–52.0)
Hemoglobin: 16.3 g/dL (ref 13.0–17.0)
Immature Granulocytes: 0 %
Lymphocytes Relative: 17 %
Lymphs Abs: 1.1 10*3/uL (ref 0.7–4.0)
MCH: 31.7 pg (ref 26.0–34.0)
MCHC: 35 g/dL (ref 30.0–36.0)
MCV: 90.5 fL (ref 80.0–100.0)
Monocytes Absolute: 0.4 10*3/uL (ref 0.1–1.0)
Monocytes Relative: 6 %
Neutro Abs: 4.6 10*3/uL (ref 1.7–7.7)
Neutrophils Relative %: 74 %
Platelet Count: 180 10*3/uL (ref 150–400)
RBC: 5.15 MIL/uL (ref 4.22–5.81)
RDW: 13.4 % (ref 11.5–15.5)
WBC Count: 6.2 10*3/uL (ref 4.0–10.5)
nRBC: 0 % (ref 0.0–0.2)

## 2022-08-06 MED ORDER — SODIUM CHLORIDE 0.9 % IV SOLN
20.0000 mg | Freq: Once | INTRAVENOUS | Status: AC
  Administered 2022-08-06: 20 mg via INTRAVENOUS
  Filled 2022-08-06: qty 20

## 2022-08-06 MED ORDER — SODIUM CHLORIDE 0.9 % IV SOLN
Freq: Once | INTRAVENOUS | Status: AC
  Filled 2022-08-06: qty 250

## 2022-08-06 MED ORDER — DEXTROSE 5 % IV SOLN
27.0000 mg/m2 | Freq: Once | INTRAVENOUS | Status: AC
  Administered 2022-08-06: 50 mg via INTRAVENOUS
  Filled 2022-08-06: qty 15

## 2022-08-06 MED ORDER — HEPARIN SOD (PORK) LOCK FLUSH 100 UNIT/ML IV SOLN
500.0000 [IU] | Freq: Once | INTRAVENOUS | Status: DC | PRN
  Filled 2022-08-06: qty 5

## 2022-08-06 MED ORDER — LIDOCAINE-PRILOCAINE 2.5-2.5 % EX CREA
1.0000 | TOPICAL_CREAM | CUTANEOUS | 0 refills | Status: DC | PRN
  Filled 2022-08-06: qty 30, 30d supply, fill #0

## 2022-08-06 MED ORDER — SODIUM CHLORIDE 0.9% FLUSH
10.0000 mL | INTRAVENOUS | Status: DC | PRN
  Filled 2022-08-06: qty 10

## 2022-08-06 MED ORDER — CYANOCOBALAMIN 1000 MCG/ML IJ SOLN
1000.0000 ug | INTRAMUSCULAR | Status: DC
  Administered 2022-08-06: 1000 ug via INTRAMUSCULAR
  Filled 2022-08-06: qty 1

## 2022-08-06 NOTE — Patient Instructions (Addendum)
[facility]  Discharge Instructions: Thank you for choosing [facility] to provide your oncology and hematology care.  If you have a lab appointment with the Cancer Center, please go directly to the Cancer Center and check in at the registration area.  Wear comfortable clothing and clothing appropriate for easy access to any Portacath or PICC line.   We strive to give you quality time with your provider. You may need to reschedule your appointment if you arrive late (15 or more minutes).  Arriving late affects you and other patients whose appointments are after yours.  Also, if you miss three or more appointments without notifying the office, you may be dismissed from the clinic at the provider's discretion.      For prescription refill requests, have your pharmacy contact our office and allow 72 hours for refills to be completed.    Today you received the following chemotherapy and/or immunotherapy agents KYPROLIS        To help prevent nausea and vomiting after your treatment, we encourage you to take your nausea medication as directed.  BELOW ARE SYMPTOMS THAT SHOULD BE REPORTED IMMEDIATELY: *FEVER GREATER THAN 100.4 F (38 C) OR HIGHER *CHILLS OR SWEATING *NAUSEA AND VOMITING THAT IS NOT CONTROLLED WITH YOUR NAUSEA MEDICATION *UNUSUAL SHORTNESS OF BREATH *UNUSUAL BRUISING OR BLEEDING *URINARY PROBLEMS (pain or burning when urinating, or frequent urination) *BOWEL PROBLEMS (unusual diarrhea, constipation, pain near the anus) TENDERNESS IN MOUTH AND THROAT WITH OR WITHOUT PRESENCE OF ULCERS (sore throat, sores in mouth, or a toothache) UNUSUAL RASH, SWELLING OR PAIN  UNUSUAL VAGINAL DISCHARGE OR ITCHING   Items with * indicate a potential emergency and should be followed up as soon as possible or go to the Emergency Department if any problems should occur.  Please show the CHEMOTHERAPY ALERT CARD or IMMUNOTHERAPY ALERT CARD at check-in  to the Emergency Department and triage nurse.  Should you have questions after your visit or need to cancel or reschedule your appointment, please contact [facility]  [phone] and follow the prompts.  Office hours are 8:00 a.m. to 4:30 p.m. Monday - Friday. Please note that voicemails left after 4:00 p.m. may not be returned until the following business day.  We are closed weekends and major holidays. You have access to a nurse at all times for urgent questions. Please call the main number to the clinic [phone] and follow the prompts.  For any non-urgent questions, you may also contact your provider using MyChart. We now offer e-Visits for anyone 23 and older to request care online for non-urgent symptoms. For details visit [email or web address].   Also download the MyChart app! Go to the app store, search "MyChart", open the app, select [facility], and log in with your MyChart username and password.  Carfilzomib Injection What is this medication? CARFILZOMIB (kar FILZ oh mib) treats multiple myeloma, a type of bone marrow cancer. It works by blocking a protein that causes cancer cells to grow and multiply. This helps to slow or stop the spread of cancer cells. This medicine may be used for other purposes; ask your health care provider or pharmacist if you have questions. COMMON BRAND NAME(S): KYPROLIS What should I tell my care team before I take this medication? They need to know if you have any of these conditions: Heart disease History of blood clots Irregular heartbeat Kidney disease Liver disease Lung or breathing disease An unusual or allergic reaction  to carfilzomib, or other medications, foods, dyes, or preservatives If you or your partner are pregnant or trying to get pregnant Breastfeeding How should I use this medication? This medication is injected into a vein. It is given by your care team in a hospital or clinic setting. Talk to  your care team about the use of this medication in children. Special care may be needed. Overdosage: If you think you have taken too much of this medicine contact a poison control center or emergency room at once. NOTE: This medicine is only for you. Do not share this medicine with others. What if I miss a dose? Keep appointments for follow-up doses. It is important not to miss your dose. Call your care team if you are unable to keep an appointment. What may interact with this medication? Interactions are not expected. This list may not describe all possible interactions. Give your health care provider a list of all the medicines, herbs, non-prescription drugs, or dietary supplements you use. Also tell them if you smoke, drink alcohol, or use illegal drugs. Some items may interact with your medicine. What should I watch for while using this medication? Your condition will be monitored carefully while you are receiving this medication. You may need blood work while taking this medication. Check with your care team if you have severe diarrhea, nausea, and vomiting, or if you sweat a lot. The loss of too much body fluid may make it dangerous for you to take this medication. This medication may affect your coordination, reaction time, or judgment. Do not drive or operate machinery until you know how this medication affects you. Sit up or stand slowly to reduce the risk of dizzy or fainting spells. Drinking alcohol with this medication can increase the risk of these side effects. Talk to your care team if you may be pregnant. Serious birth defects can occur if you take this medication during pregnancy and for 6 months after the last dose. You will need a negative pregnancy test before starting this medication. Contraception is recommended while taking this medication and for 6 months after the last dose. Your care team can help you find an option that works for you. If your partner can get pregnant, use a  condom during sex while taking this medication and for 3 months after the last dose. Do not breastfeed while taking this medication and for 2 weeks after the last dose. This medication may cause infertility. Talk to your care team if you are concerned about your fertility. What side effects may I notice from receiving this medication? Side effects that you should report to your care team as soon as possible: Allergic reactions--skin rash, itching, hives, swelling of the face, lips, tongue, or throat Bleeding--bloody or black, tar-like stools, vomiting blood or brown material that looks like coffee grounds, red or dark brown urine, small red or purple spots on skin, unusual bruising or bleeding Blood clot--pain, swelling, or warmth in the leg, shortness of breath, chest pain Dizziness, loss of balance or coordination, confusion or trouble speaking Heart attack--pain or tightness in the chest, shoulders, arms, or jaw, nausea, shortness of breath, cold or clammy skin, feeling faint or lightheaded Heart failure--shortness of breath, swelling of the ankles, feet, or hands, sudden weight gain, unusual weakness or fatigue Heart rhythm changes--fast or irregular heartbeat, dizziness, feeling faint or lightheaded, chest pain, trouble breathing Increase in blood pressure Infection--fever, chills, cough, sore throat, wounds that don't heal, pain or trouble when passing urine, general feeling  of discomfort or being unwell Infusion reactions--chest pain, shortness of breath or trouble breathing, feeling faint or lightheaded Kidney injury--decrease in the amount of urine, swelling of the ankles, hands, or feet Liver injury--right upper belly pain, loss of appetite, nausea, light-colored stool, dark yellow or brown urine, yellowing skin or eyes, unusual weakness or fatigue Lung injury--shortness of breath or trouble breathing, cough, spitting up blood, chest pain, fever Pulmonary hypertension--shortness of  breath, chest pain, fast or irregular heartbeat, feeling faint or lightheaded, fatigue, swelling of the ankles or feet Stomach pain, bloody diarrhea, pale skin, unusual weakness or fatigue, decrease in the amount of urine, which may be signs of hemolytic uremic syndrome Sudden and severe headache, confusion, change in vision, seizures, which may be signs of posterior reversible encephalopathy syndrome (PRES) TTP--purple spots on the skin or inside the mouth, pale skin, yellowing skin or eyes, unusual weakness or fatigue, fever, fast or irregular heartbeat, confusion, change in vision, trouble speaking, trouble walking Tumor lysis syndrome (TLS)--nausea, vomiting, diarrhea, decrease in the amount of urine, dark urine, unusual weakness or fatigue, confusion, muscle pain or cramps, fast or irregular heartbeat, joint pain Side effects that usually do not require medical attention (report to your care team if they continue or are bothersome): Diarrhea Fatigue Nausea Trouble sleeping This list may not describe all possible side effects. Call your doctor for medical advice about side effects. You may report side effects to FDA at 1-800-FDA-1088. Where should I keep my medication? This medication is given in a hospital or clinic. It will not be stored at home. NOTE: This sheet is a summary. It may not cover all possible information. If you have questions about this medicine, talk to your doctor, pharmacist, or health care provider.  2023 Elsevier/Gold Standard (2020-05-18 00:00:00)

## 2022-08-06 NOTE — Telephone Encounter (Signed)
Got a Prior auth for his EMLA cream. I went on cover my meds and filled out form and I should look back on 4/17 to see if is approved

## 2022-08-12 MED FILL — Dexamethasone Sodium Phosphate Inj 100 MG/10ML: INTRAMUSCULAR | Qty: 2 | Status: AC

## 2022-08-13 ENCOUNTER — Other Ambulatory Visit: Payer: Self-pay

## 2022-08-13 ENCOUNTER — Inpatient Hospital Stay: Payer: BC Managed Care – PPO

## 2022-08-13 ENCOUNTER — Other Ambulatory Visit: Payer: Self-pay | Admitting: Primary Care

## 2022-08-13 ENCOUNTER — Ambulatory Visit: Payer: BC Managed Care – PPO

## 2022-08-13 ENCOUNTER — Other Ambulatory Visit: Payer: Self-pay | Admitting: *Deleted

## 2022-08-13 ENCOUNTER — Inpatient Hospital Stay: Payer: BC Managed Care – PPO | Admitting: Hospice and Palliative Medicine

## 2022-08-13 ENCOUNTER — Inpatient Hospital Stay (HOSPITAL_BASED_OUTPATIENT_CLINIC_OR_DEPARTMENT_OTHER): Payer: BC Managed Care – PPO | Admitting: Oncology

## 2022-08-13 ENCOUNTER — Other Ambulatory Visit: Payer: BC Managed Care – PPO

## 2022-08-13 ENCOUNTER — Ambulatory Visit: Payer: BC Managed Care – PPO | Admitting: Oncology

## 2022-08-13 ENCOUNTER — Encounter: Payer: Self-pay | Admitting: Oncology

## 2022-08-13 VITALS — BP 143/94 | HR 79 | Temp 97.5°F | Resp 18 | Ht 68.0 in | Wt 154.0 lb

## 2022-08-13 DIAGNOSIS — M7918 Myalgia, other site: Secondary | ICD-10-CM

## 2022-08-13 DIAGNOSIS — E876 Hypokalemia: Secondary | ICD-10-CM

## 2022-08-13 DIAGNOSIS — G8929 Other chronic pain: Secondary | ICD-10-CM | POA: Diagnosis not present

## 2022-08-13 DIAGNOSIS — C9002 Multiple myeloma in relapse: Secondary | ICD-10-CM

## 2022-08-13 DIAGNOSIS — M62830 Muscle spasm of back: Secondary | ICD-10-CM

## 2022-08-13 DIAGNOSIS — Z9484 Stem cells transplant status: Secondary | ICD-10-CM | POA: Diagnosis not present

## 2022-08-13 DIAGNOSIS — Z7982 Long term (current) use of aspirin: Secondary | ICD-10-CM | POA: Diagnosis not present

## 2022-08-13 DIAGNOSIS — Z87891 Personal history of nicotine dependence: Secondary | ICD-10-CM | POA: Diagnosis not present

## 2022-08-13 DIAGNOSIS — Z5112 Encounter for antineoplastic immunotherapy: Secondary | ICD-10-CM | POA: Diagnosis not present

## 2022-08-13 DIAGNOSIS — Z79899 Other long term (current) drug therapy: Secondary | ICD-10-CM

## 2022-08-13 DIAGNOSIS — Z800 Family history of malignant neoplasm of digestive organs: Secondary | ICD-10-CM | POA: Diagnosis not present

## 2022-08-13 DIAGNOSIS — Z5111 Encounter for antineoplastic chemotherapy: Secondary | ICD-10-CM

## 2022-08-13 DIAGNOSIS — M545 Low back pain, unspecified: Secondary | ICD-10-CM | POA: Diagnosis not present

## 2022-08-13 DIAGNOSIS — G620 Drug-induced polyneuropathy: Secondary | ICD-10-CM | POA: Diagnosis not present

## 2022-08-13 LAB — CBC WITH DIFFERENTIAL (CANCER CENTER ONLY)
Abs Immature Granulocytes: 0.03 10*3/uL (ref 0.00–0.07)
Basophils Absolute: 0 10*3/uL (ref 0.0–0.1)
Basophils Relative: 0 %
Eosinophils Absolute: 0.3 10*3/uL (ref 0.0–0.5)
Eosinophils Relative: 5 %
HCT: 43.7 % (ref 39.0–52.0)
Hemoglobin: 15.3 g/dL (ref 13.0–17.0)
Immature Granulocytes: 1 %
Lymphocytes Relative: 19 %
Lymphs Abs: 1.2 10*3/uL (ref 0.7–4.0)
MCH: 32.2 pg (ref 26.0–34.0)
MCHC: 35 g/dL (ref 30.0–36.0)
MCV: 92 fL (ref 80.0–100.0)
Monocytes Absolute: 0.4 10*3/uL (ref 0.1–1.0)
Monocytes Relative: 6 %
Neutro Abs: 4.4 10*3/uL (ref 1.7–7.7)
Neutrophils Relative %: 69 %
Platelet Count: 169 10*3/uL (ref 150–400)
RBC: 4.75 MIL/uL (ref 4.22–5.81)
RDW: 13.6 % (ref 11.5–15.5)
WBC Count: 6.4 10*3/uL (ref 4.0–10.5)
nRBC: 0 % (ref 0.0–0.2)

## 2022-08-13 LAB — CMP (CANCER CENTER ONLY)
ALT: 23 U/L (ref 0–44)
AST: 25 U/L (ref 15–41)
Albumin: 3.9 g/dL (ref 3.5–5.0)
Alkaline Phosphatase: 107 U/L (ref 38–126)
Anion gap: 8 (ref 5–15)
BUN: 15 mg/dL (ref 6–20)
CO2: 22 mmol/L (ref 22–32)
Calcium: 8.9 mg/dL (ref 8.9–10.3)
Chloride: 109 mmol/L (ref 98–111)
Creatinine: 1.45 mg/dL — ABNORMAL HIGH (ref 0.61–1.24)
GFR, Estimated: 58 mL/min — ABNORMAL LOW (ref >60)
Glucose, Bld: 127 mg/dL — ABNORMAL HIGH (ref 70–99)
Potassium: 3.2 mmol/L — ABNORMAL LOW (ref 3.5–5.1)
Sodium: 139 mmol/L (ref 135–145)
Total Bilirubin: 0.6 mg/dL (ref 0.3–1.2)
Total Protein: 6.5 g/dL (ref 6.5–8.1)

## 2022-08-13 MED ORDER — BACLOFEN 10 MG PO TABS
10.0000 mg | ORAL_TABLET | Freq: Four times a day (QID) | ORAL | 0 refills | Status: DC | PRN
Start: 2022-08-13 — End: 2022-12-31
  Filled 2022-08-13: qty 360, 90d supply, fill #0

## 2022-08-13 MED ORDER — POTASSIUM CHLORIDE CRYS ER 20 MEQ PO TBCR
20.0000 meq | EXTENDED_RELEASE_TABLET | ORAL | 0 refills | Status: DC
  Filled 2022-08-13 (×2): qty 63, 21d supply, fill #0

## 2022-08-13 MED ORDER — HEPARIN SOD (PORK) LOCK FLUSH 100 UNIT/ML IV SOLN
500.0000 [IU] | Freq: Once | INTRAVENOUS | Status: AC | PRN
  Administered 2022-08-13: 500 [IU]
  Filled 2022-08-13: qty 5

## 2022-08-13 MED ORDER — SODIUM CHLORIDE 0.9 % IV SOLN
Freq: Once | INTRAVENOUS | Status: AC
  Filled 2022-08-13: qty 250

## 2022-08-13 MED ORDER — SODIUM CHLORIDE 0.9% FLUSH
10.0000 mL | INTRAVENOUS | Status: DC | PRN
  Administered 2022-08-13: 10 mL via INTRAVENOUS
  Filled 2022-08-13: qty 10

## 2022-08-13 MED ORDER — DEXTROSE 5 % IV SOLN
27.0000 mg/m2 | Freq: Once | INTRAVENOUS | Status: AC
  Administered 2022-08-13: 50 mg via INTRAVENOUS
  Filled 2022-08-13: qty 15

## 2022-08-13 MED ORDER — SODIUM CHLORIDE 0.9 % IV SOLN
20.0000 mg | Freq: Once | INTRAVENOUS | Status: AC
  Administered 2022-08-13: 20 mg via INTRAVENOUS
  Filled 2022-08-13: qty 20

## 2022-08-13 NOTE — Progress Notes (Signed)
Hematology/Oncology Consult note [facility]  Telephone:([phone] [phone]  Patient Care Team: Doreene Nest, NP as PCP - General (Internal Medicine) Martin Majestic, MD as PCP - Hematology/Oncology Creig Hines, MD as Consulting Physician (Hematology and Oncology)   Name of the patient: James House  [id number]  Jan 27, 1971   Date of visit: 08/13/22  Diagnosis- kappa light chain multiple myeloma in relapse    Chief complaint/ Reason for visit-on treatment assessment prior to cycle 1 day 15 of carfilzomib with Pomalyst  Heme/Onc history: Patient is a 52- year-old male with a history of kappa light chain myeloma that was treated by [name].  History is as follows:   1.  Patient presented with renal insufficiency with a creatinine of 2.4 in April 2018.  Kidney biopsy showed myeloma cast nephropathy.  Kappa light chain was 3004 and lambda 0.22 with a kappa lambda ratio of 13,000 655.  Skeletal survey showed multiple calvarial and marrow lesions.  Bone marrow biopsy in May 2018 showed 43% plasma cells.  He received CyBorD for cycle 1 in May 2018 and was subsequently switched to RVD.  He received 4 cycles followed by a repeat bone marrow biopsy in August 2018 which showed no increase in plasma cells.   2.  He underwent autologous stem cell transplantation on 01/30/2017.  He was started on Revlimid maintenance 10 mg daily in January 2019.   3.  Labs from January February and March 2019 done at [facility] showed no M spike, normal kappa lambda ratio of 1.06.  He was last seen by them in April 2019.   4. Following that patient was admitted to [facility] for healthcare associated pneumonia and was recently discharged.  He wished to transfer his care to [place] at this time.  He was on maintenance Revlimid 5 mg which was then reduced to 2.5 mg.  Follows up with pain clinic for chemo-induced peripheral neuropathy with [name].  His urine tested positive for [facility]  and therefore patient was discharged from pain clinic   5.  Noted to have gradually increasing free kappa light chainFrom 15 in 20 21-39.5 with a ratio of 3.09.  He was seen by [facility] again and underwent a repeat bone marrow biopsy which did not show any evidence of clonal plasma cells.  PET CT scan also did not show any new active lytic lesions in May 2023.Marland Kitchen  Revlimid dose was increased to 15 mg 3 weeks on 1 week off but kappa light chains show continuing to increase   6.  PET CT scan in December 2023 did not show any evidence of active myeloma.  Patient underwent repeat bone marrow biopsy at [facility] which showed 6% plasma cellsConsistent with persistent plasma cell neoplasm.  74 to 86% of isolated plasma cells show loss of T p53 and monosomy 13.  Patient was [facility] by [name] [facility] and was recommended Darzalex Pomalyst dexamethasone regimen   7.  Disease progression after 8 weekly cycles of Darazalex kappa light chain increased from 332-646 with a ratio that went up from 39-57 concerning for disease progression.  Patient switched to carfilzomib Pomalyst dexamethasone regimen  Interval history-patient reports chronic fatigue.  He has increasing generalized bodyaches since treatment.  Nausea is presently well-controlled.  ECOG PS- 1 Pain scale- 4 Opioid associated constipation- no  Review of systems- Review of Systems  Constitutional:  Positive for malaise/fatigue. Negative for chills, fever and weight loss.  HENT:  Negative for congestion,  ear discharge and nosebleeds.   Eyes:  Negative for blurred vision.  Respiratory:  Negative for cough, hemoptysis, sputum production, shortness of breath and wheezing.   Cardiovascular:  Negative for chest pain, palpitations, orthopnea and claudication.  Gastrointestinal:  Negative for abdominal pain, blood in stool, constipation, diarrhea, heartburn, melena, nausea and vomiting.  Genitourinary:  Negative for dysuria, flank pain, frequency, hematuria and urgency.   Musculoskeletal:  Positive for joint pain and myalgias. Negative for back pain.  Skin:  Negative for rash.  Neurological:  Negative for dizziness, tingling, focal weakness, seizures, weakness and headaches.  Endo/Heme/Allergies:  Does not bruise/bleed easily.  Psychiatric/Behavioral:  Negative for depression and suicidal ideas. The patient does not have insomnia.       No Known Allergies   Past Medical History:  Diagnosis Date   CKD (chronic kidney disease) stage 3, GFR 30-59 ml/min    Hypertension    Multiple myeloma 08/11/2017   Neuropathy    Pneumonia    Severe sepsis 08/11/2017   Shingles 08/03/2019     Past Surgical History:  Procedure Laterality Date   ABDOMINAL SURGERY     COLONOSCOPY WITH PROPOFOL N/A 05/25/2021   Procedure: COLONOSCOPY WITH PROPOFOL;  Surgeon: Toney Reil, MD;  Location: [facility];  Service: Gastroenterology;  Laterality: N/A;   IR IMAGING GUIDED PORT INSERTION  08/02/2022   JOINT REPLACEMENT     right knee   KNEE SURGERY Left     Social History   Socioeconomic History   Marital status: Single    Spouse name: [name]   Number of children: 3   Years of education: Not on file   Highest education level: Not on file  Occupational History   Not on file  Tobacco Use   Smoking status: Former   Smokeless tobacco: Current    Types: Snuff  Vaping Use   Vaping Use: Never used  Substance and Sexual Activity   Alcohol use: No   Drug use: No   Sexual activity: Yes  Other Topics Concern   Not on file  Social History Narrative   Live in private residence with spouse and mother in law   Social Determinants of Health   Financial Resource Strain: Low Risk  (08/12/2017)   Overall Financial Resource Strain (CARDIA)    Difficulty of Paying Living Expenses: Not hard at all  Food Insecurity: No Food Insecurity (08/12/2017)   Hunger Vital Sign    Worried About Running Out of Food in the Last Year: Never true    Ran Out of Food in the Last  Year: Never true  Transportation Needs: No Transportation Needs (08/12/2017)   PRAPARE - Administrator, Civil Service (Medical): No    Lack of Transportation (Non-Medical): No  Physical Activity: Sufficiently Active (08/12/2017)   Exercise Vital Sign    Days of Exercise per Week: 7 days    Minutes of Exercise per Session: 30 min  Stress: No Stress Concern Present (08/12/2017)   [employer] of Occupational Health - Occupational Stress Questionnaire    Feeling of Stress : Only a little  Social Connections: Somewhat Isolated (08/12/2017)   Social Connection and Isolation Panel [NHANES]    Frequency of Communication with Friends and Family: Once a week    Frequency of Social Gatherings with Friends and Family: Once a week    Attends Religious Services: More than 4 times per year    Active Member of [employer] or Organizations: No    Attends [employer]  or Organization Meetings: Never    Marital Status: Married  Catering manager Violence: Not At Risk (08/12/2017)   Humiliation, Afraid, Rape, and Kick questionnaire    Fear of Current or Ex-Partner: No    Emotionally Abused: No    Physically Abused: No    Sexually Abused: No    Family History  Problem Relation Age of Onset   Cancer Mother 60       Pancreatic   COPD Mother    Diabetes Mother    Hyperlipidemia Mother    Hypertension Mother    Cancer Maternal Uncle        pancreatic   Cancer Maternal Grandmother 80       colon   COPD Maternal Grandmother    Diabetes Maternal Grandmother    Hypertension Maternal Grandmother      Current Outpatient Medications:    acyclovir (ZOVIRAX) 400 MG tablet, Take 1 tablet (400 mg total) by mouth 2 (two) times daily., Disp: 60 tablet, Rfl: 2   amitriptyline (ELAVIL) 10 MG tablet, Take 1 tablet (10 mg total) by mouth at bedtime., Disp: 15 tablet, Rfl: 0   aspirin EC 81 MG tablet, Take by mouth., Disp: , Rfl:    baclofen (LIORESAL) 10 MG tablet, Take 1 tablet (10 mg total) by mouth 4 (four)  times daily as needed for muscle spasms., Disp: 360 tablet, Rfl: 0   baclofen (LIORESAL) 10 MG tablet, Take by mouth., Disp: , Rfl:    celecoxib (CELEBREX) 200 MG capsule, Take 1 capsule (200 mg total) by mouth daily., Disp: 30 capsule, Rfl: 2   cyanocobalamin (VITAMIN B12) 1000 MCG/ML injection, Inject 1 mL (1,000 mcg total) into the muscle every 30 (thirty) days., Disp: 1 mL, Rfl: 11   cyanocobalamin (VITAMIN B12) 1000 MCG/ML injection, Inject into the skin., Disp: , Rfl:    dexamethasone (DECADRON) 4 MG tablet, Take 5 tablets (20 mg total) by mouth once a week. Take first dose 1 hour prior to reporting to clinic for daratumumab. Take 2nd dose the day after daratumumab., Disp: 20 tablet, Rfl: 2   DULoxetine (CYMBALTA) 30 MG capsule, Take by mouth., Disp: , Rfl:    levothyroxine (SYNTHROID) 50 MCG tablet, Take 1 tablet (50 mcg total) by mouth daily before breakfast., Disp: 30 tablet, Rfl: 2   lidocaine-prilocaine (EMLA) cream, Apply 1 Application topically as needed., Disp: 30 g, Rfl: 0   montelukast (SINGULAIR) 10 MG tablet, Take 1 tablet in the evening 1 day before your treatment. Day of treatment take 1 tablet in the morning for the first 2 treatments of Daratumumab, Disp: 4 tablet, Rfl: 0   OLANZapine (ZYPREXA) 10 MG tablet, Take 1 tablet (10 mg total) by mouth at bedtime. To help with nausea prevention., Disp: 30 tablet, Rfl: 1   OLANZapine (ZYPREXA) 10 MG tablet, Take by mouth., Disp: , Rfl:    olmesartan (BENICAR) 20 MG tablet, Take 1 tablet (20 mg total) by mouth daily. For blood pressure., Disp: 90 tablet, Rfl: 3   Oxycodone HCl 10 MG TABS, Take 1 tablet (10 mg total) by mouth every 8 (eight) hours as needed., Disp: 90 tablet, Rfl: 0   pomalidomide (POMALYST) 2 MG capsule, Take 1 capsule (2 mg total) by mouth daily. Take for 21 days, then hold for 7 days. Repeat every 28 days., Disp: 21 capsule, Rfl: 0   pregabalin (LYRICA) 150 MG capsule, Take 1 capsule (150 mg total) by mouth 3 (three)  times daily as needed (for pain)., Disp: 270  capsule, Rfl: 0   prochlorperazine (COMPAZINE) 10 MG tablet, Take by mouth., Disp: , Rfl:    sertraline (ZOLOFT) 25 MG tablet, Take by mouth., Disp: , Rfl:    tadalafil (CIALIS) 5 MG tablet, Take 1 or 2 tablets by mouth daily, Disp: , Rfl:    potassium chloride SA (KLOR-CON M) 20 MEQ tablet, Take 1 tablet (20 mEq total) by mouth as directed. Take 2 tablets in morning and 1 in evening for 3 weeks, Disp: 63 tablet, Rfl: 0 No current facility-administered medications for this visit.  Facility-Administered Medications Ordered in Other Visits:    heparin lock flush 100 unit/mL, 500 Units, Intracatheter, Once PRN, Creig Hines, MD  Physical exam:  Vitals:   08/13/22 0921  BP: (!) 143/94  Pulse: 79  Resp: 18  Temp: (!) 97.5 F (36.4 C)  TempSrc: Tympanic  SpO2: 100%  Weight: 154 lb (69.9 kg)  Height: 5\' 8"  (1.727 m)   Physical Exam Cardiovascular:     Rate and Rhythm: Normal rate and regular rhythm.     Heart sounds: Normal heart sounds.  Pulmonary:     Effort: Pulmonary effort is normal.     Breath sounds: Normal breath sounds.  Abdominal:     General: Bowel sounds are normal.     Palpations: Abdomen is soft.  Skin:    General: Skin is warm and dry.  Neurological:     Mental Status: He is alert and oriented to person, place, and time.         Latest Ref Rng & Units 08/13/2022    9:03 AM  CMP  Glucose 70 - 99 mg/dL 409   BUN 6 - 20 mg/dL 15   Creatinine 8.11 - 1.24 mg/dL 9.14   Sodium 782 - 956 mmol/L 139   Potassium 3.5 - 5.1 mmol/L 3.2   Chloride 98 - 111 mmol/L 109   CO2 22 - 32 mmol/L 22   Calcium 8.9 - 10.3 mg/dL 8.9   Total Protein 6.5 - 8.1 g/dL 6.5   Total Bilirubin 0.3 - 1.2 mg/dL 0.6   Alkaline Phos 38 - 126 U/L 107   AST 15 - 41 U/L 25   ALT 0 - 44 U/L 23       Latest Ref Rng & Units 08/13/2022    9:03 AM  CBC  WBC 4.0 - 10.5 K/uL 6.4   Hemoglobin 13.0 - 17.0 g/dL 21.3   Hematocrit 08.6 - 52.0 % 43.7    Platelets 150 - 400 K/uL 169     No images are attached to the encounter.  IR IMAGING GUIDED PORT INSERTION  Result Date: 08/02/2022 CLINICAL DATA:  Multiple myeloma, access for chemotherapy EXAM: RIGHT INTERNAL JUGULAR SINGLE LUMEN POWER PORT CATHETER INSERTION Date:  08/02/2022 08/02/2022 2:44 pm Radiologist:  Judie Petit. Ruel Favors, MD Guidance:  Ultrasound and fluoroscopic MEDICATIONS: 1% lidocaine local with epinephrine ANESTHESIA/SEDATION: Versed 3.0 mg IV; Fentanyl 100 mcg IV; Moderate Sedation Time:  25 minute The patient was continuously monitored during the procedure by the interventional radiology nurse under my direct supervision. FLUOROSCOPY: 0 minutes, 24 seconds (0 mGy) COMPLICATIONS: None immediate. CONTRAST:  None. PROCEDURE: Informed consent was obtained from the patient following explanation of the procedure, risks, benefits and alternatives. The patient understands, agrees and consents for the procedure. All questions were addressed. A time out was performed. Maximal barrier sterile technique utilized including caps, mask, sterile gowns, sterile gloves, large sterile drape, hand hygiene, and 2% chlorhexidine scrub. Under sterile conditions  and local anesthesia, right internal jugular micropuncture venous access was performed. Access was performed with ultrasound. Images were obtained for documentation of the patent right internal jugular vein. A guide wire was inserted followed by a transitional dilator. This allowed insertion of a guide wire and catheter into the IVC. Measurements were obtained from the SVC / RA junction back to the right IJ venotomy site. In the right infraclavicular chest, a subcutaneous pocket was created over the second anterior rib. This was done under sterile conditions and local anesthesia. 1% lidocaine with epinephrine was utilized for this. A 2.5 cm incision was made in the skin. Blunt dissection was performed to create a subcutaneous pocket over the right pectoralis  major muscle. The pocket was flushed with saline vigorously. There was adequate hemostasis. The port catheter was assembled and checked for leakage. The port catheter was secured in the pocket with two retention sutures. The tubing was tunneled subcutaneously to the right venotomy site and inserted into the SVC/RA junction through a valved peel-away sheath. Position was confirmed with fluoroscopy. Images were obtained for documentation. The patient tolerated the procedure well. No immediate complications. Incisions were closed in a two layer fashion with 4 - 0 Vicryl suture. Dermabond was applied to the skin. The port catheter was accessed, blood was aspirated followed by saline and heparin flushes. Needle was removed. A dry sterile dressing was applied. IMPRESSION: Ultrasound and fluoroscopically guided right internal jugular single lumen power port catheter insertion. Tip in the SVC/RA junction. Catheter ready for use. Electronically Signed   By: Judie Petit.  Shick M.D.   On: 08/02/2022 15:01     Assessment and plan- Patient is a 52 y.o. male with history of kappa light chain multiple myeloma currently in relapse.  He is here for on treatment assessment prior to cycle 1 day 15 of carfilzomib  Serum kappa light chain's were steadily going up despite Darzalex Revlimid regimen.  Kappa light chain a month ago was elevated at 646 as compared to 83 in September 2023 and the ratio has gone up from 5.6-57.  Patient was therefore switched to carfilzomib Pomalyst regimen.  Counts okay to proceed with cycle 1 day 15 of carfilzomib today.  He is on Pomalyst 2 mg 3 weeks on and 1 week off as well.  Suspect arthralgias and fatigue likely secondary to combination treatment.  Not making any changes to his pain regimen today.  I will see him back in 2 weeks for cycle 2-day 1 of Pomalyst.  Myeloma panel and serum free light chains from today are pending and we are checking that monthly.  He has an appointment coming up with Dr. Pandora Leiter  at [facility] in 1 month  Continue aspirin and acyclovir prophylaxis.  He is on 20 mg of dexamethasone weekly.  Hypokalemia: We will increase his oral potassium to 40 mEq in the morning and 20 in the evening due to chronic hypokalemia  Patient is not receiving any bisphosphonates presently since his PET scan does not show any evidence of metabolically active multiple myeloma.   Visit Diagnosis 1. Multiple myeloma in relapse   2. High risk medication use   3. Encounter for antineoplastic chemotherapy   4. Hypokalemia      Dr. Owens Shark, MD, MPH [facility] at [facility] [phone] 08/13/2022 12:30 PM

## 2022-08-13 NOTE — Progress Notes (Signed)
No concerns. 

## 2022-08-13 NOTE — Patient Instructions (Signed)
[facility] [place] [facility]  Discharge Instructions: Thank you for choosing [facility] to provide your oncology and hematology care.  If you have a lab appointment with the Cancer Center, please go directly to the Cancer Center and check in at the registration area.  Wear comfortable clothing and clothing appropriate for easy access to any Portacath or PICC line.   We strive to give you quality time with your provider. You may need to reschedule your appointment if you arrive late (15 or more minutes).  Arriving late affects you and other patients whose appointments are after yours.  Also, if you miss three or more appointments without notifying the office, you may be dismissed from the clinic at the provider's discretion.      For prescription refill requests, have your pharmacy contact our office and allow 72 hours for refills to be completed.     To help prevent nausea and vomiting after your treatment, we encourage you to take your nausea medication as directed.  BELOW ARE SYMPTOMS THAT SHOULD BE REPORTED IMMEDIATELY: *FEVER GREATER THAN 100.4 F (38 C) OR HIGHER *CHILLS OR SWEATING *NAUSEA AND VOMITING THAT IS NOT CONTROLLED WITH YOUR NAUSEA MEDICATION *UNUSUAL SHORTNESS OF BREATH *UNUSUAL BRUISING OR BLEEDING *URINARY PROBLEMS (pain or burning when urinating, or frequent urination) *BOWEL PROBLEMS (unusual diarrhea, constipation, pain near the anus) TENDERNESS IN MOUTH AND THROAT WITH OR WITHOUT PRESENCE OF ULCERS (sore throat, sores in mouth, or a toothache) UNUSUAL RASH, SWELLING OR PAIN  UNUSUAL VAGINAL DISCHARGE OR ITCHING   Items with * indicate a potential emergency and should be followed up as soon as possible or go to the Emergency Department if any problems should occur.  Please show the CHEMOTHERAPY ALERT CARD or IMMUNOTHERAPY ALERT CARD at check-in to the Emergency Department and triage nurse.  Should you have questions after your visit  or need to cancel or reschedule your appointment, please contact [facility] [place] [facility]  [phone] and follow the prompts.  Office hours are 8:00 a.m. to 4:30 p.m. Monday - Friday. Please note that voicemails left after 4:00 p.m. may not be returned until the following business day.  We are closed weekends and major holidays. You have access to a nurse at all times for urgent questions. Please call the main number to the clinic [phone] and follow the prompts.  For any non-urgent questions, you may also contact your provider using MyChart. We now offer e-Visits for anyone 18 and older to request care online for non-urgent symptoms. For details visit [email or web address].   Also download the MyChart app! Go to the app store, search "MyChart", open the app, select [facility], and log in with your MyChart username and password.    

## 2022-08-14 ENCOUNTER — Encounter: Payer: Self-pay | Admitting: Oncology

## 2022-08-14 ENCOUNTER — Other Ambulatory Visit: Payer: Self-pay | Admitting: *Deleted

## 2022-08-14 ENCOUNTER — Other Ambulatory Visit: Payer: Self-pay

## 2022-08-14 LAB — KAPPA/LAMBDA LIGHT CHAINS
Kappa free light chain: 44.8 mg/L — ABNORMAL HIGH (ref 3.3–19.4)
Kappa, lambda light chain ratio: 5.53 — ABNORMAL HIGH (ref 0.26–1.65)
Lambda free light chains: 8.1 mg/L (ref 5.7–26.3)

## 2022-08-14 MED ORDER — OXYCODONE HCL ER 10 MG PO T12A
10.0000 mg | EXTENDED_RELEASE_TABLET | Freq: Two times a day (BID) | ORAL | 0 refills | Status: DC
  Filled 2022-08-14 – 2022-08-15 (×6): qty 60, 30d supply, fill #0
  Filled 2022-08-16: qty 14, 7d supply, fill #0
  Filled 2022-08-16 (×2): qty 60, 30d supply, fill #0

## 2022-08-15 ENCOUNTER — Other Ambulatory Visit: Payer: Self-pay | Admitting: *Deleted

## 2022-08-15 ENCOUNTER — Encounter: Payer: Self-pay | Admitting: Oncology

## 2022-08-15 ENCOUNTER — Other Ambulatory Visit: Payer: Self-pay

## 2022-08-16 ENCOUNTER — Other Ambulatory Visit: Payer: Self-pay

## 2022-08-16 ENCOUNTER — Encounter: Payer: Self-pay | Admitting: Internal Medicine

## 2022-08-16 ENCOUNTER — Inpatient Hospital Stay (HOSPITAL_BASED_OUTPATIENT_CLINIC_OR_DEPARTMENT_OTHER): Payer: BC Managed Care – PPO | Admitting: Hospice and Palliative Medicine

## 2022-08-16 ENCOUNTER — Inpatient Hospital Stay (HOSPITAL_BASED_OUTPATIENT_CLINIC_OR_DEPARTMENT_OTHER): Payer: BC Managed Care – PPO | Admitting: Internal Medicine

## 2022-08-16 ENCOUNTER — Other Ambulatory Visit (HOSPITAL_COMMUNITY): Payer: Self-pay

## 2022-08-16 VITALS — BP 161/101 | HR 88 | Temp 98.0°F | Resp 16 | Wt 153.0 lb

## 2022-08-16 DIAGNOSIS — Z79899 Other long term (current) drug therapy: Secondary | ICD-10-CM | POA: Diagnosis not present

## 2022-08-16 DIAGNOSIS — M792 Neuralgia and neuritis, unspecified: Secondary | ICD-10-CM

## 2022-08-16 DIAGNOSIS — T451X5A Adverse effect of antineoplastic and immunosuppressive drugs, initial encounter: Secondary | ICD-10-CM

## 2022-08-16 DIAGNOSIS — G8929 Other chronic pain: Secondary | ICD-10-CM | POA: Diagnosis not present

## 2022-08-16 DIAGNOSIS — G620 Drug-induced polyneuropathy: Secondary | ICD-10-CM

## 2022-08-16 DIAGNOSIS — Z515 Encounter for palliative care: Secondary | ICD-10-CM

## 2022-08-16 DIAGNOSIS — Z7982 Long term (current) use of aspirin: Secondary | ICD-10-CM | POA: Diagnosis not present

## 2022-08-16 DIAGNOSIS — M545 Low back pain, unspecified: Secondary | ICD-10-CM | POA: Diagnosis not present

## 2022-08-16 DIAGNOSIS — Z800 Family history of malignant neoplasm of digestive organs: Secondary | ICD-10-CM | POA: Diagnosis not present

## 2022-08-16 DIAGNOSIS — C9002 Multiple myeloma in relapse: Secondary | ICD-10-CM | POA: Diagnosis not present

## 2022-08-16 DIAGNOSIS — Z5112 Encounter for antineoplastic immunotherapy: Secondary | ICD-10-CM | POA: Diagnosis not present

## 2022-08-16 DIAGNOSIS — E876 Hypokalemia: Secondary | ICD-10-CM | POA: Diagnosis not present

## 2022-08-16 DIAGNOSIS — Z9484 Stem cells transplant status: Secondary | ICD-10-CM | POA: Diagnosis not present

## 2022-08-16 DIAGNOSIS — Z87891 Personal history of nicotine dependence: Secondary | ICD-10-CM | POA: Diagnosis not present

## 2022-08-16 MED ORDER — PREGABALIN 300 MG PO CAPS
300.0000 mg | ORAL_CAPSULE | Freq: Two times a day (BID) | ORAL | 3 refills | Status: DC
Start: 2022-08-16 — End: 2023-03-26
  Filled 2022-08-16: qty 60, 30d supply, fill #0
  Filled 2022-10-21: qty 60, 30d supply, fill #1
  Filled 2023-01-07: qty 60, 30d supply, fill #2

## 2022-08-16 MED ORDER — DULOXETINE HCL 60 MG PO CPEP
60.0000 mg | ORAL_CAPSULE | Freq: Every day | ORAL | 1 refills | Status: AC
  Filled 2022-08-16: qty 60, 60d supply, fill #0

## 2022-08-16 MED ORDER — AMITRIPTYLINE HCL 10 MG PO TABS
10.0000 mg | ORAL_TABLET | Freq: Every day | ORAL | 1 refills | Status: DC
  Filled 2022-08-16: qty 30, 30d supply, fill #0
  Filled 2022-10-14: qty 30, 30d supply, fill #1

## 2022-08-16 NOTE — Progress Notes (Signed)
Palliative Medicine [facility] at [facility] [phone] [phone]   Name: James House. Date: 08/16/2022 MRN: [id number]  DOB: 12-21-1970  Patient Care Team: Doreene Nest, NP as PCP - General (Internal Medicine) Martin Majestic, MD as PCP - Hematology/Oncology Creig Hines, MD as Consulting Physician (Hematology and Oncology)    REASON FOR CONSULTATION: James Bufford. is a 52 y.o. male with multiple medical problems including kappa light chain myeloma status post stem cell transplant in 2018 followed by Revlimid maintenance.  Repeat bone marrow biopsy in 2023 suggested persistent plasma cell neoplasm.  Patient was restarted on treatment with Darzalex Pomalyst and dexamethasone regimen.  He has history of chronic pain and was previously managed by the pain clinic prior to being discharged due to UDS positive for THC.  Patient has had some difficulty with insomnia.  He was referred to palliative care to address goals and manage ongoing symptoms.  SOCIAL HISTORY:     reports that he has quit smoking. His smokeless tobacco use includes snuff. He reports that he does not drink alcohol and does not use drugs.  Patient is unmarried.  He lives at home with his daughter.  He has another daughter lives nearby.  Patient works in a [employer].  ADVANCE DIRECTIVES:    CODE STATUS:   PAST MEDICAL HISTORY: Past Medical History:  Diagnosis Date   CKD (chronic kidney disease) stage 3, GFR 30-59 ml/min (HCC)    Hypertension    Multiple myeloma (HCC) 08/11/2017   Neuropathy    Pneumonia    Severe sepsis (HCC) 08/11/2017   Shingles 08/03/2019    PAST SURGICAL HISTORY:  Past Surgical History:  Procedure Laterality Date   ABDOMINAL SURGERY     COLONOSCOPY WITH PROPOFOL N/A 05/25/2021   Procedure: COLONOSCOPY WITH PROPOFOL;  Surgeon: Toney Reil, MD;  Location: [facility] ENDOSCOPY;  Service: Gastroenterology;  Laterality:  N/A;   IR IMAGING GUIDED PORT INSERTION  08/02/2022   JOINT REPLACEMENT     right knee   KNEE SURGERY Left     HEMATOLOGY/ONCOLOGY HISTORY:  Oncology History  Multiple myeloma (HCC)  08/11/2017 Initial Diagnosis   Multiple myeloma (HCC)   10/22/2021 Cancer Staging   Staging form: Plasma Cell Myeloma and Plasma Cell Disorders, AJCC 8th Edition - Clinical stage from 10/22/2021: High-risk cytogenetics: Unknown, LDH: Normal - Signed by Creig Hines, MD on 10/22/2021 Stage prefix: Recurrence Cytogenetics: Unknown   06/04/2022 - 07/23/2022 Chemotherapy   Patient is on Treatment Plan : MYELOMA RELAPSED REFRACTORY Daratumumab SQ + Pomalidomide + Dexamethasone (DaraPd) q28d     07/30/2022 -  Chemotherapy   Patient is on Treatment Plan : MYELOMA RELAPSED/ REFRACTORY Carfilzomib D1,8,15 (20/27) + Pomalidomide + Dexamethasone (KPd) q28d       ALLERGIES:  has No Known Allergies.  MEDICATIONS:  Current Outpatient Medications  Medication Sig Dispense Refill   acyclovir (ZOVIRAX) 400 MG tablet Take 1 tablet (400 mg total) by mouth 2 (two) times daily. 60 tablet 2   amitriptyline (ELAVIL) 10 MG tablet Take 1 tablet (10 mg total) by mouth at bedtime. 15 tablet 0   aspirin EC 81 MG tablet Take by mouth.     baclofen (LIORESAL) 10 MG tablet Take 1 tablet (10 mg total) by mouth 4 (four) times daily as needed for muscle spasms. 360 tablet 0   celecoxib (CELEBREX) 200 MG capsule Take 1 capsule (200 mg total) by mouth daily. 30 capsule  2   cyanocobalamin (VITAMIN B12) 1000 MCG/ML injection Inject 1 mL (1,000 mcg total) into the muscle every 30 (thirty) days. 1 mL 11   dexamethasone (DECADRON) 4 MG tablet Take 5 tablets (20 mg total) by mouth once a week. Take first dose 1 hour prior to reporting to clinic for daratumumab. Take 2nd dose the day after daratumumab. 20 tablet 2   DULoxetine (CYMBALTA) 60 MG capsule Take 1 capsule (60 mg total) by mouth daily. 60 capsule 1   levothyroxine (SYNTHROID) 50 MCG tablet  Take 1 tablet (50 mcg total) by mouth daily before breakfast. 30 tablet 2   lidocaine-prilocaine (EMLA) cream Apply 1 Application topically as needed. 30 g 0   montelukast (SINGULAIR) 10 MG tablet Take 1 tablet in the evening 1 day before your treatment. Day of treatment take 1 tablet in the morning for the first 2 treatments of Daratumumab 4 tablet 0   OLANZapine (ZYPREXA) 10 MG tablet Take 1 tablet (10 mg total) by mouth at bedtime. To help with nausea prevention. 30 tablet 1   OLANZapine (ZYPREXA) 10 MG tablet Take by mouth.     olmesartan (BENICAR) 20 MG tablet Take 1 tablet (20 mg total) by mouth daily. For blood pressure. 90 tablet 3   oxyCODONE (OXYCONTIN) 10 mg 12 hr tablet Take 1 tablet (10 mg total) by mouth every 12 (twelve) hours. (Patient not taking: Reported on 08/16/2022) 60 tablet 0   Oxycodone HCl 10 MG TABS Take 1 tablet (10 mg total) by mouth every 8 (eight) hours as needed. 90 tablet 0   pomalidomide (POMALYST) 2 MG capsule Take 1 capsule (2 mg total) by mouth daily. Take for 21 days, then hold for 7 days. Repeat every 28 days. 21 capsule 0   potassium chloride SA (KLOR-CON M) 20 MEQ tablet Take 1 tablet (20 mEq total) by mouth as directed. Take 2 tablets in morning and 1 in evening for 3 weeks 63 tablet 0   pregabalin (LYRICA) 300 MG capsule Take 1 capsule (300 mg total) by mouth 2 (two) times daily. 60 capsule 3   prochlorperazine (COMPAZINE) 10 MG tablet Take by mouth.     sertraline (ZOLOFT) 25 MG tablet Take by mouth.     tadalafil (CIALIS) 5 MG tablet Take 1 or 2 tablets by mouth daily     No current facility-administered medications for this visit.    VITAL SIGNS: There were no vitals taken for this visit. There were no vitals filed for this visit.  Estimated body mass index is 23.26 kg/m as calculated from the following:   Height as of 08/13/22: 5\' 8"  (1.727 m).   Weight as of an earlier encounter on 08/16/22: 153 lb (69.4 kg).  LABS: CBC:    Component Value  Date/Time   WBC 6.4 08/13/2022 0903   WBC 5.2 07/23/2022 0836   HGB 15.3 08/13/2022 0903   HCT 43.7 08/13/2022 0903   PLT 169 08/13/2022 0903   MCV 92.0 08/13/2022 0903   NEUTROABS 4.4 08/13/2022 0903   LYMPHSABS 1.2 08/13/2022 0903   MONOABS 0.4 08/13/2022 0903   EOSABS 0.3 08/13/2022 0903   BASOSABS 0.0 08/13/2022 0903   Comprehensive Metabolic Panel:    Component Value Date/Time   NA 139 08/13/2022 0903   NA 140 11/26/2017 1307   K 3.2 (L) 08/13/2022 0903   CL 109 08/13/2022 0903   CO2 22 08/13/2022 0903   BUN 15 08/13/2022 0903   BUN 11 11/26/2017 1307   CREATININE  1.45 (H) 08/13/2022 0903   GLUCOSE 127 (H) 08/13/2022 0903   CALCIUM 8.9 08/13/2022 0903   AST 25 08/13/2022 0903   ALT 23 08/13/2022 0903   ALKPHOS 107 08/13/2022 0903   BILITOT 0.6 08/13/2022 0903   PROT 6.5 08/13/2022 0903   PROT 7.0 11/26/2017 1307   ALBUMIN 3.9 08/13/2022 0903   ALBUMIN 4.3 11/26/2017 1307    RADIOGRAPHIC STUDIES: IR IMAGING GUIDED PORT INSERTION  Result Date: 08/02/2022 CLINICAL DATA:  Multiple myeloma, access for chemotherapy EXAM: RIGHT INTERNAL JUGULAR SINGLE LUMEN POWER PORT CATHETER INSERTION Date:  08/02/2022 08/02/2022 2:44 pm Radiologist:  Judie Petit. Ruel Favors, MD Guidance:  Ultrasound and fluoroscopic MEDICATIONS: 1% lidocaine local with epinephrine ANESTHESIA/SEDATION: Versed 3.0 mg IV; Fentanyl 100 mcg IV; Moderate Sedation Time:  25 minute The patient was continuously monitored during the procedure by the interventional radiology nurse under my direct supervision. FLUOROSCOPY: 0 minutes, 24 seconds (0 mGy) COMPLICATIONS: None immediate. CONTRAST:  None. PROCEDURE: Informed consent was obtained from the patient following explanation of the procedure, risks, benefits and alternatives. The patient understands, agrees and consents for the procedure. All questions were addressed. A time out was performed. Maximal barrier sterile technique utilized including caps, mask, sterile gowns,  sterile gloves, large sterile drape, hand hygiene, and 2% chlorhexidine scrub. Under sterile conditions and local anesthesia, right internal jugular micropuncture venous access was performed. Access was performed with ultrasound. Images were obtained for documentation of the patent right internal jugular vein. A guide wire was inserted followed by a transitional dilator. This allowed insertion of a guide wire and catheter into the IVC. Measurements were obtained from the SVC / RA junction back to the right IJ venotomy site. In the right infraclavicular chest, a subcutaneous pocket was created over the second anterior rib. This was done under sterile conditions and local anesthesia. 1% lidocaine with epinephrine was utilized for this. A 2.5 cm incision was made in the skin. Blunt dissection was performed to create a subcutaneous pocket over the right pectoralis major muscle. The pocket was flushed with saline vigorously. There was adequate hemostasis. The port catheter was assembled and checked for leakage. The port catheter was secured in the pocket with two retention sutures. The tubing was tunneled subcutaneously to the right venotomy site and inserted into the SVC/RA junction through a valved peel-away sheath. Position was confirmed with fluoroscopy. Images were obtained for documentation. The patient tolerated the procedure well. No immediate complications. Incisions were closed in a two layer fashion with 4 - 0 Vicryl suture. Dermabond was applied to the skin. The port catheter was accessed, blood was aspirated followed by saline and heparin flushes. Needle was removed. A dry sterile dressing was applied. IMPRESSION: Ultrasound and fluoroscopically guided right internal jugular single lumen power port catheter insertion. Tip in the SVC/RA junction. Catheter ready for use. Electronically Signed   By: Judie Petit.  Shick M.D.   On: 08/02/2022 15:01    PERFORMANCE STATUS (ECOG) : 1 - Symptomatic but completely  ambulatory  Review of Systems Unless otherwise noted, a complete review of systems is negative.  Physical Exam General: NAD Pulmonary: Unlabored Extremities: no edema, no joint deformities Skin: no rashes Neurological: Grossly nonfocal  IMPRESSION: Follow-up visit.    Patient also seen by Dr. Barbaraann Cao today with subsequent dose adjustment of duloxetine and Lyrica for neuropathic symptoms.  Additionally, patient is on amitriptyline and states that that has helped his sleep.  Patient denies significant changes or concerns today.  He continues to endorse chronic back  pain and is currently wearing a back brace for that.  Discussed with Dr. Barbaraann Cao who feels like there is possibly a component of hyperalgesia/hypersensitivity from pain medications and options for dose reduction of medications were offered to patient.  He says that he will try to limit medications as tolerated.  PLAN: -Continue current scope of treatment -Continue amitriptyline 10 mg nightly -RTC 3 weeks  Case and plan discussed with Dr. Barbaraann Cao  Patient expressed understanding and was in agreement with this plan. He also understands that He can call the clinic at any time with any questions, concerns, or complaints.     Time Total: 15 minutes  Visit consisted of counseling and education dealing with the complex and emotionally intense issues of symptom management and palliative care in the setting of serious and potentially life-threatening illness.Greater than 50%  of this time was spent counseling and coordinating care related to the above assessment and plan.  Signed by: Laurette Schimke, PhD, NP-C

## 2022-08-16 NOTE — Progress Notes (Signed)
[facility] at [facility] [address]  [place], [place] [address] [phone]   New Patient Evaluation  Date of Service: 08/16/22 Patient Name: James House. Patient MRN: [id number] Patient DOB: March 09, 1971 Provider: Henreitta Leber, MD  Identifying Statement:  James House. is a 52 y.o. male with Chemotherapy-induced peripheral neuropathy [facility]) who presents for initial consultation and evaluation regarding cancer associated neurologic deficits.    Referring Provider: Doreene Nest, NP [address] [place],  [place] [address]  Primary Cancer:  Oncologic History: Oncology History  Multiple myeloma (HCC)  08/11/2017 Initial Diagnosis   Multiple myeloma (HCC)   10/22/2021 Cancer Staging   Staging form: Plasma Cell Myeloma and Plasma Cell Disorders, AJCC 8th Edition - Clinical stage from 10/22/2021: High-risk cytogenetics: Unknown, LDH: Normal - Signed by Creig Hines, MD on 10/22/2021 Stage prefix: Recurrence Cytogenetics: Unknown   06/04/2022 - 07/23/2022 Chemotherapy   Patient is on Treatment Plan : MYELOMA RELAPSED REFRACTORY Daratumumab SQ + Pomalidomide + Dexamethasone (DaraPd) q28d     07/30/2022 -  Chemotherapy   Patient is on Treatment Plan : MYELOMA RELAPSED/ REFRACTORY Carfilzomib D1,8,15 (20/27) + Pomalidomide + Dexamethasone (KPd) q28d       History of Present Illness: The patient's records from the referring physician were obtained and reviewed and the patient interviewed to confirm this HPI.  James House Presents to review neuropathic pain.  He describes several year history of burning and "heat" pain in his feet and hands.  Since he started on the immunotherapy infusions, his symptoms have spread up to his lower shins.  Pain can be debilitating at times and interferes with his sleep.  He is currently dosing Lyrica, Cymbalta and Elavil.  Previously failed gabapentin.  Also doses oxycodone 10mg  3-4x per day for chronic low back  pain; this daily use of pain medication for low back pain predates his myeloma diagnosis.  Medications: Current Outpatient Medications on File Prior to Visit  Medication Sig Dispense Refill   acyclovir (ZOVIRAX) 400 MG tablet Take 1 tablet (400 mg total) by mouth 2 (two) times daily. 60 tablet 2   amitriptyline (ELAVIL) 10 MG tablet Take 1 tablet (10 mg total) by mouth at bedtime. 15 tablet 0   aspirin EC 81 MG tablet Take by mouth.     baclofen (LIORESAL) 10 MG tablet Take 1 tablet (10 mg total) by mouth 4 (four) times daily as needed for muscle spasms. 360 tablet 0   celecoxib (CELEBREX) 200 MG capsule Take 1 capsule (200 mg total) by mouth daily. 30 capsule 2   cyanocobalamin (VITAMIN B12) 1000 MCG/ML injection Inject 1 mL (1,000 mcg total) into the muscle every 30 (thirty) days. 1 mL 11   dexamethasone (DECADRON) 4 MG tablet Take 5 tablets (20 mg total) by mouth once a week. Take first dose 1 hour prior to reporting to clinic for daratumumab. Take 2nd dose the day after daratumumab. 20 tablet 2   DULoxetine (CYMBALTA) 30 MG capsule Take by mouth.     levothyroxine (SYNTHROID) 50 MCG tablet Take 1 tablet (50 mcg total) by mouth daily before breakfast. 30 tablet 2   lidocaine-prilocaine (EMLA) cream Apply 1 Application topically as needed. 30 g 0   montelukast (SINGULAIR) 10 MG tablet Take 1 tablet in the evening 1 day before your treatment. Day of treatment take 1 tablet in the morning for the first 2 treatments of Daratumumab 4 tablet 0  OLANZapine (ZYPREXA) 10 MG tablet Take 1 tablet (10 mg total) by mouth at bedtime. To help with nausea prevention. 30 tablet 1   OLANZapine (ZYPREXA) 10 MG tablet Take by mouth.     olmesartan (BENICAR) 20 MG tablet Take 1 tablet (20 mg total) by mouth daily. For blood pressure. 90 tablet 3   Oxycodone HCl 10 MG TABS Take 1 tablet (10 mg total) by mouth every 8 (eight) hours as needed. 90 tablet 0   pomalidomide (POMALYST) 2 MG capsule Take 1 capsule (2 mg  total) by mouth daily. Take for 21 days, then hold for 7 days. Repeat every 28 days. 21 capsule 0   potassium chloride SA (KLOR-CON M) 20 MEQ tablet Take 1 tablet (20 mEq total) by mouth as directed. Take 2 tablets in morning and 1 in evening for 3 weeks 63 tablet 0   pregabalin (LYRICA) 150 MG capsule Take 1 capsule (150 mg total) by mouth 3 (three) times daily as needed (for pain). 270 capsule 0   prochlorperazine (COMPAZINE) 10 MG tablet Take by mouth.     sertraline (ZOLOFT) 25 MG tablet Take by mouth.     tadalafil (CIALIS) 5 MG tablet Take 1 or 2 tablets by mouth daily     oxyCODONE (OXYCONTIN) 10 mg 12 hr tablet Take 1 tablet (10 mg total) by mouth every 12 (twelve) hours. (Patient not taking: Reported on 08/16/2022) 60 tablet 0   No current facility-administered medications on file prior to visit.    Allergies: No Known Allergies Past Medical History:  Past Medical History:  Diagnosis Date   CKD (chronic kidney disease) stage 3, GFR 30-59 ml/min (HCC)    Hypertension    Multiple myeloma (HCC) 08/11/2017   Neuropathy    Pneumonia    Severe sepsis (HCC) 08/11/2017   Shingles 08/03/2019   Past Surgical History:  Past Surgical History:  Procedure Laterality Date   ABDOMINAL SURGERY     COLONOSCOPY WITH PROPOFOL N/A 05/25/2021   Procedure: COLONOSCOPY WITH PROPOFOL;  Surgeon: Toney Reil, MD;  Location: [facility] ENDOSCOPY;  Service: Gastroenterology;  Laterality: N/A;   IR IMAGING GUIDED PORT INSERTION  08/02/2022   JOINT REPLACEMENT     right knee   KNEE SURGERY Left    Social History:  Social History   Socioeconomic History   Marital status: Single    Spouse name: [name]   Number of children: 3   Years of education: Not on file   Highest education level: Not on file  Occupational History   Not on file  Tobacco Use   Smoking status: Former   Smokeless tobacco: Current    Types: Snuff  Vaping Use   Vaping Use: Never used  Substance and Sexual Activity   Alcohol  use: No   Drug use: No   Sexual activity: Yes  Other Topics Concern   Not on file  Social History Narrative   Live in private residence with spouse and mother in law   Social Determinants of Health   Financial Resource Strain: Low Risk  (08/12/2017)   Overall Financial Resource Strain (CARDIA)    Difficulty of Paying Living Expenses: Not hard at all  Food Insecurity: No Food Insecurity (08/12/2017)   Hunger Vital Sign    Worried About Running Out of Food in the Last Year: Never true    Ran Out of Food in the Last Year: Never true  Transportation Needs: No Transportation Needs (08/12/2017)   PRAPARE - Transportation  Lack of Transportation (Medical): No    Lack of Transportation (Non-Medical): No  Physical Activity: Sufficiently Active (08/12/2017)   Exercise Vital Sign    Days of Exercise per Week: 7 days    Minutes of Exercise per Session: 30 min  Stress: No Stress Concern Present (08/12/2017)   [employer] of Occupational Health - Occupational Stress Questionnaire    Feeling of Stress : Only a little  Social Connections: Somewhat Isolated (08/12/2017)   Social Connection and Isolation Panel [NHANES]    Frequency of Communication with Friends and Family: Once a week    Frequency of Social Gatherings with Friends and Family: Once a week    Attends Religious Services: More than 4 times per year    Active Member of [employer] or Organizations: No    Attends Banker Meetings: Never    Marital Status: Married  Catering manager Violence: Not At Risk (08/12/2017)   Humiliation, Afraid, Rape, and Kick questionnaire    Fear of Current or Ex-Partner: No    Emotionally Abused: No    Physically Abused: No    Sexually Abused: No   Family History:  Family History  Problem Relation Age of Onset   Cancer Mother 69       Pancreatic   COPD Mother    Diabetes Mother    Hyperlipidemia Mother    Hypertension Mother    Cancer Maternal Uncle        pancreatic   Cancer  Maternal Grandmother 37       colon   COPD Maternal Grandmother    Diabetes Maternal Grandmother    Hypertension Maternal Grandmother     Review of Systems: Constitutional: Doesn't report fevers, chills or abnormal weight loss Eyes: Doesn't report blurriness of vision Ears, nose, mouth, throat, and face: Doesn't report sore throat Respiratory: Doesn't report cough, dyspnea or wheezes Cardiovascular: Doesn't report palpitation, chest discomfort  Gastrointestinal:  Doesn't report nausea, constipation, diarrhea GU: Doesn't report incontinence Skin: Doesn't report skin rashes Neurological: Per HPI Musculoskeletal: Doesn't report joint pain Behavioral/Psych: Doesn't report anxiety  Physical Exam: Vitals:   08/16/22 0914  BP: (!) 161/101  Pulse: 88  Resp: 16  Temp: 98 F (36.7 C)   KPS: 80. General: Alert, cooperative, pleasant, in no acute distress Head: Normal EENT: No conjunctival injection or scleral icterus.  Lungs: Resp effort normal Cardiac: Regular rate Abdomen: Non-distended abdomen Skin: No rashes cyanosis or petechiae. Extremities: No clubbing or edema  Neurologic Exam: Mental Status: Awake, alert, attentive to examiner. Oriented to self and environment. Language is fluent with intact comprehension.  Cranial Nerves: Visual acuity is grossly normal. Visual fields are full. Extra-ocular movements intact. No ptosis. Face is symmetric Motor: Tone and bulk are normal. Power is full in both arms and legs. Reflexes are symmetric, no pathologic reflexes present.  Sensory: Intact to light touch Gait: Normal.   Labs: I have reviewed the data as listed    Component Value Date/Time   NA 139 08/13/2022 0903   NA 140 11/26/2017 1307   K 3.2 (L) 08/13/2022 0903   CL 109 08/13/2022 0903   CO2 22 08/13/2022 0903   GLUCOSE 127 (H) 08/13/2022 0903   BUN 15 08/13/2022 0903   BUN 11 11/26/2017 1307   CREATININE 1.45 (H) 08/13/2022 0903   CALCIUM 8.9 08/13/2022 0903   PROT  6.5 08/13/2022 0903   PROT 7.0 11/26/2017 1307   ALBUMIN 3.9 08/13/2022 0903   ALBUMIN 4.3 11/26/2017 1307   AST  25 08/13/2022 0903   ALT 23 08/13/2022 0903   ALKPHOS 107 08/13/2022 0903   BILITOT 0.6 08/13/2022 0903   GFRNONAA 58 (L) 08/13/2022 0903   GFRAA >60 12/31/2019 1401   Lab Results  Component Value Date   WBC 6.4 08/13/2022   NEUTROABS 4.4 08/13/2022   HGB 15.3 08/13/2022   HCT 43.7 08/13/2022   MCV 92.0 08/13/2022   PLT 169 08/13/2022     Assessment/Plan Chemotherapy-induced peripheral neuropathy (HCC)  James House. presents with clinical syndrome consistent with symmetric, length dependent, small and large fiber peripheral neuropathy.  Etiology is effects of multiple myeloma and exposure to chemotherapy.  There is likely a significant component of analgesia overuse and increased pain sensitivity from long term use of opiates and over the counter pain medications for his back pain.  Lumbar spine from 2020 was reviewed, was normal.  We reviewed pathophysiology of chemotherapy induced neuropathy, available treatments, and goals of care.  Discussed increasing Lyrica to 300mg  BID, increasing Cymbalta to 60mg  daily.  He is agreeable with this.  Though withdrawing from chronic analgesia might be the best long term solution, in the short term this will be difficult because he is still working (manual labor).  We spent twenty additional minutes teaching regarding the natural history, biology, and historical experience in the treatment of neurologic complications of cancer.   We appreciate the opportunity to participate in the care of James Garden..    We ask that James House. return to clinic in 2 months for additional med titration, or sooner as needed.  All questions were answered. The patient knows to call the clinic with any problems, questions or concerns. No barriers to learning were detected.  The total time spent in the encounter was 40 minutes and more  than 50% was on counseling and review of test results   Henreitta Leber, MD Medical Director of Neuro-Oncology [facility] 08/16/22 9:12 AM

## 2022-08-16 NOTE — Progress Notes (Signed)
Pt referred by Dr Smith Robert for neuropathy pain in legs, feet, hands along with numbness and tingling and increased difficulty in walking. Pt BP elevated, reports ongoing issue since he's been having more pain.

## 2022-08-18 LAB — MULTIPLE MYELOMA PANEL, SERUM
Albumin SerPl Elph-Mcnc: 3.5 g/dL (ref 2.9–4.4)
Albumin/Glob SerPl: 1.5 (ref 0.7–1.7)
Alpha 1: 0.2 g/dL (ref 0.0–0.4)
Alpha2 Glob SerPl Elph-Mcnc: 0.7 g/dL (ref 0.4–1.0)
B-Globulin SerPl Elph-Mcnc: 0.9 g/dL (ref 0.7–1.3)
Gamma Glob SerPl Elph-Mcnc: 0.6 g/dL (ref 0.4–1.8)
Globulin, Total: 2.4 g/dL (ref 2.2–3.9)
IgA: 67 mg/dL — ABNORMAL LOW (ref 90–386)
IgG (Immunoglobin G), Serum: 640 mg/dL (ref 603–1613)
IgM (Immunoglobulin M), Srm: 46 mg/dL (ref 20–172)
M Protein SerPl Elph-Mcnc: 0.2 g/dL — ABNORMAL HIGH
Total Protein ELP: 5.9 g/dL — ABNORMAL LOW (ref 6.0–8.5)

## 2022-08-19 ENCOUNTER — Other Ambulatory Visit: Payer: Self-pay | Admitting: *Deleted

## 2022-08-19 ENCOUNTER — Other Ambulatory Visit: Payer: Self-pay

## 2022-08-19 MED ORDER — MORPHINE SULFATE ER 15 MG PO TBCR
15.0000 mg | EXTENDED_RELEASE_TABLET | Freq: Two times a day (BID) | ORAL | 0 refills | Status: DC
  Filled 2022-08-19: qty 60, 30d supply, fill #0

## 2022-08-20 ENCOUNTER — Other Ambulatory Visit: Payer: Self-pay

## 2022-08-22 ENCOUNTER — Other Ambulatory Visit: Payer: Self-pay | Admitting: *Deleted

## 2022-08-22 DIAGNOSIS — C9000 Multiple myeloma not having achieved remission: Secondary | ICD-10-CM

## 2022-08-23 ENCOUNTER — Other Ambulatory Visit: Payer: Self-pay | Admitting: *Deleted

## 2022-08-24 ENCOUNTER — Encounter: Payer: Self-pay | Admitting: Oncology

## 2022-08-24 MED ORDER — XTAMPZA ER 9 MG PO C12A
9.0000 mg | EXTENDED_RELEASE_CAPSULE | Freq: Two times a day (BID) | ORAL | 0 refills | Status: DC
  Filled 2022-08-24: qty 60, 30d supply, fill #0

## 2022-08-25 ENCOUNTER — Other Ambulatory Visit: Payer: Self-pay

## 2022-08-26 ENCOUNTER — Other Ambulatory Visit: Payer: Self-pay

## 2022-08-26 ENCOUNTER — Other Ambulatory Visit: Payer: Self-pay | Admitting: Oncology

## 2022-08-26 DIAGNOSIS — C9000 Multiple myeloma not having achieved remission: Secondary | ICD-10-CM

## 2022-08-27 ENCOUNTER — Encounter: Payer: Self-pay | Admitting: Oncology

## 2022-08-27 ENCOUNTER — Inpatient Hospital Stay (HOSPITAL_BASED_OUTPATIENT_CLINIC_OR_DEPARTMENT_OTHER): Payer: BC Managed Care – PPO | Admitting: Oncology

## 2022-08-27 ENCOUNTER — Inpatient Hospital Stay: Payer: BC Managed Care – PPO | Attending: Internal Medicine

## 2022-08-27 ENCOUNTER — Other Ambulatory Visit: Payer: Self-pay

## 2022-08-27 ENCOUNTER — Inpatient Hospital Stay: Payer: BC Managed Care – PPO

## 2022-08-27 VITALS — BP 152/97 | HR 80 | Temp 97.4°F | Resp 16 | Ht 68.0 in | Wt 156.3 lb

## 2022-08-27 DIAGNOSIS — Z79899 Other long term (current) drug therapy: Secondary | ICD-10-CM | POA: Insufficient documentation

## 2022-08-27 DIAGNOSIS — E876 Hypokalemia: Secondary | ICD-10-CM

## 2022-08-27 DIAGNOSIS — C9002 Multiple myeloma in relapse: Secondary | ICD-10-CM

## 2022-08-27 DIAGNOSIS — Z5111 Encounter for antineoplastic chemotherapy: Secondary | ICD-10-CM

## 2022-08-27 DIAGNOSIS — T451X5A Adverse effect of antineoplastic and immunosuppressive drugs, initial encounter: Secondary | ICD-10-CM

## 2022-08-27 DIAGNOSIS — Z5112 Encounter for antineoplastic immunotherapy: Secondary | ICD-10-CM | POA: Insufficient documentation

## 2022-08-27 DIAGNOSIS — G620 Drug-induced polyneuropathy: Secondary | ICD-10-CM

## 2022-08-27 DIAGNOSIS — R5382 Chronic fatigue, unspecified: Secondary | ICD-10-CM

## 2022-08-27 LAB — CMP (CANCER CENTER ONLY)
ALT: 18 U/L (ref 0–44)
AST: 26 U/L (ref 15–41)
Albumin: 3.8 g/dL (ref 3.5–5.0)
Alkaline Phosphatase: 101 U/L (ref 38–126)
Anion gap: 8 (ref 5–15)
BUN: 11 mg/dL (ref 6–20)
CO2: 22 mmol/L (ref 22–32)
Calcium: 8.9 mg/dL (ref 8.9–10.3)
Chloride: 108 mmol/L (ref 98–111)
Creatinine: 1.56 mg/dL — ABNORMAL HIGH (ref 0.61–1.24)
GFR, Estimated: 53 mL/min — ABNORMAL LOW (ref >60)
Glucose, Bld: 124 mg/dL — ABNORMAL HIGH (ref 70–99)
Potassium: 3.3 mmol/L — ABNORMAL LOW (ref 3.5–5.1)
Sodium: 138 mmol/L (ref 135–145)
Total Bilirubin: 0.8 mg/dL (ref 0.3–1.2)
Total Protein: 6 g/dL — ABNORMAL LOW (ref 6.5–8.1)

## 2022-08-27 LAB — CBC WITH DIFFERENTIAL (CANCER CENTER ONLY)
Abs Immature Granulocytes: 0.01 10*3/uL (ref 0.00–0.07)
Basophils Absolute: 0 10*3/uL (ref 0.0–0.1)
Basophils Relative: 1 %
Eosinophils Absolute: 0.3 10*3/uL (ref 0.0–0.5)
Eosinophils Relative: 7 %
HCT: 39.9 % (ref 39.0–52.0)
Hemoglobin: 14.1 g/dL (ref 13.0–17.0)
Immature Granulocytes: 0 %
Lymphocytes Relative: 21 %
Lymphs Abs: 0.9 10*3/uL (ref 0.7–4.0)
MCH: 32.1 pg (ref 26.0–34.0)
MCHC: 35.3 g/dL (ref 30.0–36.0)
MCV: 90.9 fL (ref 80.0–100.0)
Monocytes Absolute: 0.4 10*3/uL (ref 0.1–1.0)
Monocytes Relative: 8 %
Neutro Abs: 2.9 10*3/uL (ref 1.7–7.7)
Neutrophils Relative %: 63 %
Platelet Count: 181 10*3/uL (ref 150–400)
RBC: 4.39 MIL/uL (ref 4.22–5.81)
RDW: 13.6 % (ref 11.5–15.5)
WBC Count: 4.5 10*3/uL (ref 4.0–10.5)
nRBC: 0 % (ref 0.0–0.2)

## 2022-08-27 MED ORDER — HEPARIN SOD (PORK) LOCK FLUSH 100 UNIT/ML IV SOLN
500.0000 [IU] | Freq: Once | INTRAVENOUS | Status: AC | PRN
  Administered 2022-08-27: 500 [IU]
  Filled 2022-08-27: qty 5

## 2022-08-27 MED ORDER — DEXTROSE 5 % IV SOLN
27.0000 mg/m2 | Freq: Once | INTRAVENOUS | Status: AC
  Administered 2022-08-27: 50 mg via INTRAVENOUS
  Filled 2022-08-27: qty 15

## 2022-08-27 MED ORDER — SODIUM CHLORIDE 0.9 % IV SOLN
Freq: Once | INTRAVENOUS | Status: DC
  Filled 2022-08-27: qty 250

## 2022-08-27 MED ORDER — SODIUM CHLORIDE 0.9 % IV SOLN
20.0000 mg | Freq: Once | INTRAVENOUS | Status: AC
  Administered 2022-08-27: 20 mg via INTRAVENOUS
  Filled 2022-08-27: qty 2

## 2022-08-27 MED ORDER — SODIUM CHLORIDE 0.9 % IV SOLN
Freq: Once | INTRAVENOUS | Status: AC
  Filled 2022-08-27: qty 250

## 2022-08-27 MED ORDER — SODIUM CHLORIDE 0.9% FLUSH
10.0000 mL | INTRAVENOUS | Status: DC | PRN
  Filled 2022-08-27: qty 10

## 2022-08-27 NOTE — Progress Notes (Signed)
Hematology/Oncology Consult note [facility]  Telephone:([phone] [phone]  Patient Care Team: Doreene Nest, NP as PCP - General (Internal Medicine) Martin Majestic, MD as PCP - Hematology/Oncology Creig Hines, MD as Consulting Physician (Hematology and Oncology)   Name of the patient: James House  [id number]  [birth year]   Date of visit: 08/27/22  Diagnosis- kappa light chain multiple myeloma in relapse    Chief complaint/ Reason for visit-on treatment assessment prior to cycle 2-day 1 of carfilzomib  Heme/Onc history: Patient is a 52- year-old male with a history of kappa light chain myeloma that was treated by [name].  History is as follows:   1.  Patient presented with renal insufficiency with a creatinine of 2.4 in April 2018.  Kidney biopsy showed myeloma cast nephropathy.  Kappa light chain was 3004 and lambda 0.22 with a kappa lambda ratio of 13,000 655.  Skeletal survey showed multiple calvarial and marrow lesions.  Bone marrow biopsy in May 2018 showed 43% plasma cells.  He received CyBorD for cycle 1 in May 2018 and was subsequently switched to RVD.  He received 4 cycles followed by a repeat bone marrow biopsy in August 2018 which showed no increase in plasma cells.   2.  He underwent autologous stem cell transplantation on 01/30/2017.  He was started on Revlimid maintenance 10 mg daily in January 2019.   3.  Labs from January February and March 2019 done at [facility] showed no M spike, normal kappa lambda ratio of 1.06.  He was last seen by them in April 2019.   4. Following that patient was admitted to [facility] for healthcare associated pneumonia and was recently discharged.  He wished to transfer his care to [place] at this time.  He was on maintenance Revlimid 5 mg which was then reduced to 2.5 mg.  Follows up with pain clinic for chemo-induced peripheral neuropathy with [name].  His urine tested positive for [facility] and therefore  patient was discharged from pain clinic   5.  Noted to have gradually increasing free kappa light chainFrom 15 in 20 21-39.5 with a ratio of 3.09.  He was seen by [facility] again and underwent a repeat bone marrow biopsy which did not show any evidence of clonal plasma cells.  PET CT scan also did not show any new active lytic lesions in May 2023.Marland Kitchen  Revlimid dose was increased to 15 mg 3 weeks on 1 week off but kappa light chains show continuing to increase   6.  PET CT scan in December 2023 did not show any evidence of active myeloma.  Patient underwent repeat bone marrow biopsy at [facility] which showed 6% plasma cellsConsistent with persistent plasma cell neoplasm.  74 to 86% of isolated plasma cells show loss of T p53 and monosomy 13.  Patient was seen by [name] at [facility] and was recommended Darzalex Pomalyst dexamethasone regimen   7.  Disease progression after 8 weekly cycles of Darazalex kappa light chain increased from 332-646 with a ratio that went up from 39-57 concerning for disease progression.  Patient switched to carfilzomib Pomalyst dexamethasone regimen  Interval history-patient reports generalized body aches on the present regimen.  His Cymbalta dose was increased by Dr. Barbaraann Cao from 30 mg to 60 mg.  Patient states he could not tolerate that dose and had an episode of fecal incontinence.  He reports chronic fatigue and states that his body hurts all over.  Occasional shortness of  breath on exertion.  He walks over 10,000 steps yesterday and felt exhausted at the end of the day  ECOG PS- 1 Pain scale- 5 Opioid associated constipation- no  Review of systems- Review of Systems  Constitutional:  Positive for malaise/fatigue. Negative for chills, fever and weight loss.       Body aches  HENT:  Negative for congestion, ear discharge and nosebleeds.   Eyes:  Negative for blurred vision.  Respiratory:  Negative for cough, hemoptysis, sputum production, shortness of breath and wheezing.    Cardiovascular:  Negative for chest pain, palpitations, orthopnea and claudication.  Gastrointestinal:  Negative for abdominal pain, blood in stool, constipation, diarrhea, heartburn, melena, nausea and vomiting.  Genitourinary:  Negative for dysuria, flank pain, frequency, hematuria and urgency.  Musculoskeletal:  Negative for back pain, joint pain and myalgias.  Skin:  Negative for rash.  Neurological:  Negative for dizziness, tingling, focal weakness, seizures, weakness and headaches.  Endo/Heme/Allergies:  Does not bruise/bleed easily.  Psychiatric/Behavioral:  Negative for depression and suicidal ideas. The patient does not have insomnia.       No Known Allergies   Past Medical History:  Diagnosis Date   CKD (chronic kidney disease) stage 3, GFR 30-59 ml/min (HCC)    Hypertension    Multiple myeloma (HCC) 08/11/2017   Neuropathy    Pneumonia    Severe sepsis (HCC) 08/11/2017   Shingles 08/03/2019     Past Surgical History:  Procedure Laterality Date   ABDOMINAL SURGERY     COLONOSCOPY WITH PROPOFOL N/A 05/25/2021   Procedure: COLONOSCOPY WITH PROPOFOL;  Surgeon: Toney Reil, MD;  Location: [facility] ENDOSCOPY;  Service: Gastroenterology;  Laterality: N/A;   IR IMAGING GUIDED PORT INSERTION  08/02/2022   JOINT REPLACEMENT     right knee   KNEE SURGERY Left     Social History   Socioeconomic History   Marital status: Single    Spouse name: [name]   Number of children: 3   Years of education: Not on file   Highest education level: Not on file  Occupational History   Not on file  Tobacco Use   Smoking status: Former   Smokeless tobacco: Current    Types: Snuff  Vaping Use   Vaping Use: Never used  Substance and Sexual Activity   Alcohol use: No   Drug use: No   Sexual activity: Yes  Other Topics Concern   Not on file  Social History Narrative   Live in private residence with spouse and mother in law   Social Determinants of Health   Financial Resource  Strain: Low Risk  (08/12/2017)   Overall Financial Resource Strain (CARDIA)    Difficulty of Paying Living Expenses: Not hard at all  Food Insecurity: No Food Insecurity (08/12/2017)   Hunger Vital Sign    Worried About Running Out of Food in the Last Year: Never true    Ran Out of Food in the Last Year: Never true  Transportation Needs: No Transportation Needs (08/12/2017)   PRAPARE - Administrator, Civil Service (Medical): No    Lack of Transportation (Non-Medical): No  Physical Activity: Sufficiently Active (08/12/2017)   Exercise Vital Sign    Days of Exercise per Week: 7 days    Minutes of Exercise per Session: 30 min  Stress: No Stress Concern Present (08/12/2017)   [employer] of Occupational Health - Occupational Stress Questionnaire    Feeling of Stress : Only a little  Social Connections:  Somewhat Isolated (08/12/2017)   Social Connection and Isolation Panel [NHANES]    Frequency of Communication with Friends and Family: Once a week    Frequency of Social Gatherings with Friends and Family: Once a week    Attends Religious Services: More than 4 times per year    Active Member of [employer] or Organizations: No    Attends Banker Meetings: Never    Marital Status: Married  Catering manager Violence: Not At Risk (08/12/2017)   Humiliation, Afraid, Rape, and Kick questionnaire    Fear of Current or Ex-Partner: No    Emotionally Abused: No    Physically Abused: No    Sexually Abused: No    Family History  Problem Relation Age of Onset   Cancer Mother 77       Pancreatic   COPD Mother    Diabetes Mother    Hyperlipidemia Mother    Hypertension Mother    Cancer Maternal Uncle        pancreatic   Cancer Maternal Grandmother 58       colon   COPD Maternal Grandmother    Diabetes Maternal Grandmother    Hypertension Maternal Grandmother      Current Outpatient Medications:    acyclovir (ZOVIRAX) 400 MG tablet, Take 1 tablet (400 mg total) by  mouth 2 (two) times daily., Disp: 60 tablet, Rfl: 2   amitriptyline (ELAVIL) 10 MG tablet, Take 1 tablet (10 mg total) by mouth at bedtime., Disp: 30 tablet, Rfl: 1   aspirin EC 81 MG tablet, Take by mouth., Disp: , Rfl:    baclofen (LIORESAL) 10 MG tablet, Take 1 tablet (10 mg total) by mouth 4 (four) times daily as needed for muscle spasms., Disp: 360 tablet, Rfl: 0   celecoxib (CELEBREX) 200 MG capsule, Take 1 capsule (200 mg total) by mouth daily., Disp: 30 capsule, Rfl: 2   cyanocobalamin (VITAMIN B12) 1000 MCG/ML injection, Inject 1 mL (1,000 mcg total) into the muscle every 30 (thirty) days., Disp: 1 mL, Rfl: 11   dexamethasone (DECADRON) 4 MG tablet, Take 5 tablets (20 mg total) by mouth once a week. Take first dose 1 hour prior to reporting to clinic for daratumumab. Take 2nd dose the day after daratumumab., Disp: 20 tablet, Rfl: 2   DULoxetine (CYMBALTA) 60 MG capsule, Take 1 capsule (60 mg total) by mouth daily., Disp: 60 capsule, Rfl: 1   levothyroxine (SYNTHROID) 50 MCG tablet, Take 1 tablet (50 mcg total) by mouth daily before breakfast., Disp: 30 tablet, Rfl: 2   lidocaine-prilocaine (EMLA) cream, Apply 1 Application topically as needed., Disp: 30 g, Rfl: 0   montelukast (SINGULAIR) 10 MG tablet, Take 1 tablet in the evening 1 day before your treatment. Day of treatment take 1 tablet in the morning for the first 2 treatments of Daratumumab, Disp: 4 tablet, Rfl: 0   OLANZapine (ZYPREXA) 10 MG tablet, Take 1 tablet (10 mg total) by mouth at bedtime. To help with nausea prevention., Disp: 30 tablet, Rfl: 1   OLANZapine (ZYPREXA) 10 MG tablet, Take by mouth., Disp: , Rfl:    oxyCODONE ER (XTAMPZA ER) 9 MG C12A, Take 9 mg by mouth every 12 (twelve) hours., Disp: 60 capsule, Rfl: 0   Oxycodone HCl 10 MG TABS, Take 1 tablet (10 mg total) by mouth every 8 (eight) hours as needed., Disp: 90 tablet, Rfl: 0   pomalidomide (POMALYST) 2 MG capsule, Take 1 capsule (2 mg total) by mouth daily. Take 1  tablet daily for 21 days and 7 days off , Rems [id number] obtained 5/6, Disp: 21 capsule, Rfl: 0   potassium chloride SA (KLOR-CON M) 20 MEQ tablet, Take 1 tablet (20 mEq total) by mouth as directed. Take 2 tablets in morning and 1 in evening for 3 weeks, Disp: 63 tablet, Rfl: 0   pregabalin (LYRICA) 300 MG capsule, Take 1 capsule (300 mg total) by mouth 2 (two) times daily., Disp: 60 capsule, Rfl: 3   prochlorperazine (COMPAZINE) 10 MG tablet, Take by mouth., Disp: , Rfl:   Physical exam:  Vitals:   08/27/22 0921 08/27/22 0929  BP: (!) 152/101 (!) 152/97  Pulse: 81 80  Resp: 16   Temp: (!) 97.4 F (36.3 C)   TempSrc: Tympanic   SpO2: 100%   Weight: 156 lb 4.8 oz (70.9 kg)   Height: 5\' 8"  (1.727 m)    Physical Exam Cardiovascular:     Rate and Rhythm: Normal rate and regular rhythm.     Heart sounds: Normal heart sounds.  Pulmonary:     Effort: Pulmonary effort is normal.     Breath sounds: Normal breath sounds.  Abdominal:     General: Bowel sounds are normal.     Palpations: Abdomen is soft.  Musculoskeletal:     Right lower leg: No edema.     Left lower leg: No edema.  Skin:    General: Skin is warm and dry.  Neurological:     Mental Status: He is alert and oriented to person, place, and time.         Latest Ref Rng & Units 08/27/2022    8:59 AM  CMP  Glucose 70 - 99 mg/dL 540   BUN 6 - 20 mg/dL 11   Creatinine 9.81 - 1.24 mg/dL 1.91   Sodium 478 - 295 mmol/L 138   Potassium 3.5 - 5.1 mmol/L 3.3   Chloride 98 - 111 mmol/L 108   CO2 22 - 32 mmol/L 22   Calcium 8.9 - 10.3 mg/dL 8.9   Total Protein 6.5 - 8.1 g/dL 6.0   Total Bilirubin 0.3 - 1.2 mg/dL 0.8   Alkaline Phos 38 - 126 U/L 101   AST 15 - 41 U/L 26   ALT 0 - 44 U/L 18       Latest Ref Rng & Units 08/27/2022    8:59 AM  CBC  WBC 4.0 - 10.5 K/uL 4.5   Hemoglobin 13.0 - 17.0 g/dL 62.1   Hematocrit 30.8 - 52.0 % 39.9   Platelets 150 - 400 K/uL 181     No images are attached to the encounter.  IR  IMAGING GUIDED PORT INSERTION  Result Date: 08/02/2022 CLINICAL DATA:  Multiple myeloma, access for chemotherapy EXAM: RIGHT INTERNAL JUGULAR SINGLE LUMEN POWER PORT CATHETER INSERTION Date:  08/02/2022 08/02/2022 2:44 pm Radiologist:  Judie Petit. Ruel Favors, MD Guidance:  Ultrasound and fluoroscopic MEDICATIONS: 1% lidocaine local with epinephrine ANESTHESIA/SEDATION: Versed 3.0 mg IV; Fentanyl 100 mcg IV; Moderate Sedation Time:  25 minute The patient was continuously monitored during the procedure by the interventional radiology nurse under my direct supervision. FLUOROSCOPY: 0 minutes, 24 seconds (0 mGy) COMPLICATIONS: None immediate. CONTRAST:  None. PROCEDURE: Informed consent was obtained from the patient following explanation of the procedure, risks, benefits and alternatives. The patient understands, agrees and consents for the procedure. All questions were addressed. A time out was performed. Maximal barrier sterile technique utilized including caps, mask, sterile gowns, sterile gloves, large sterile drape,  hand hygiene, and 2% chlorhexidine scrub. Under sterile conditions and local anesthesia, right internal jugular micropuncture venous access was performed. Access was performed with ultrasound. Images were obtained for documentation of the patent right internal jugular vein. A guide wire was inserted followed by a transitional dilator. This allowed insertion of a guide wire and catheter into the IVC. Measurements were obtained from the SVC / RA junction back to the right IJ venotomy site. In the right infraclavicular chest, a subcutaneous pocket was created over the second anterior rib. This was done under sterile conditions and local anesthesia. 1% lidocaine with epinephrine was utilized for this. A 2.5 cm incision was made in the skin. Blunt dissection was performed to create a subcutaneous pocket over the right pectoralis major muscle. The pocket was flushed with saline vigorously. There was adequate  hemostasis. The port catheter was assembled and checked for leakage. The port catheter was secured in the pocket with two retention sutures. The tubing was tunneled subcutaneously to the right venotomy site and inserted into the SVC/RA junction through a valved peel-away sheath. Position was confirmed with fluoroscopy. Images were obtained for documentation. The patient tolerated the procedure well. No immediate complications. Incisions were closed in a two layer fashion with 4 - 0 Vicryl suture. Dermabond was applied to the skin. The port catheter was accessed, blood was aspirated followed by saline and heparin flushes. Needle was removed. A dry sterile dressing was applied. IMPRESSION: Ultrasound and fluoroscopically guided right internal jugular single lumen power port catheter insertion. Tip in the SVC/RA junction. Catheter ready for use. Electronically Signed   By: Judie Petit.  Shick M.D.   On: 08/02/2022 15:01     Assessment and plan- Patient is a 52 y.o. male with history of kappa light chain multiple myeloma currently in relapse.  He is here for on treatment assessment prior to cycle 2-day 1 of carfilzomib  Patient was switched from Pomalyst Darzalex regimen to carfilzomib,This regimen.  His free light chain has come down significantly from 646 presently to 44 with a ratio that has come down from 57-5.5.  He will continue present regimen until progression or toxicity.  Patient reports significant fatigue and generalized bodyaches on this regimen.  He also reports that his joints pop and he feels weak when he climbs up stairs.  I have asked him to reduce the dose of Decadron to 10 mg weekly.  He is already on a reduced dose of Pomalyst 2 mg 2 weeks on and 1 week off.  I am continuing a full dose of carfilzomib at 27 mg/m but if symptoms continue to worsen I will reduce it down to 20 mg/m.  Patient is unsure as to what blood pressure medications he is on.  He will inform us later today and I will adjust his  blood pressure medications given that his blood pressure in the office is running high at 152/101.  Carfilzomib can cause hypertension.  I will see him back in 2 weeks for cycle 2-day 15 of carfilzomib  Generalized body aches/chemo induced peripheral neuropathy: He will go down on Cymbalta dose back to 30 mg.  He is on Xtampza ER as well as as needed oxycodone.  Hypokalemia: Continue oral potassium  Continue aspirin and acyclovir prophylaxis.  He is not on bisphosphonates as PET scan did not show any evidence of active myeloma.  He has an appointment coming up with Dr. Pandora Leiter at [facility] in 1 week.   Visit Diagnosis 1. Multiple myeloma in relapse (HCC)  2. High risk medication use   3. Encounter for antineoplastic chemotherapy   4. Hypokalemia   5. Chronic fatigue   6. Chemotherapy-induced peripheral neuropathy (HCC)      Dr. Owens Shark, MD, MPH [facility] [phone] 08/27/2022 9:57 AM

## 2022-08-27 NOTE — Patient Instructions (Signed)
[facility] [place] [facility]  Discharge Instructions: Thank you for choosing [facility] to provide your oncology and hematology care.  If you have a lab appointment with the Cancer Center, please go directly to the Cancer Center and check in at the registration area.  Wear comfortable clothing and clothing appropriate for easy access to any Portacath or PICC line.   We strive to give you quality time with your provider. You may need to reschedule your appointment if you arrive late (15 or more minutes).  Arriving late affects you and other patients whose appointments are after yours.  Also, if you miss three or more appointments without notifying the office, you may be dismissed from the clinic at the provider's discretion.      For prescription refill requests, have your pharmacy contact our office and allow 72 hours for refills to be completed.     To help prevent nausea and vomiting after your treatment, we encourage you to take your nausea medication as directed.  BELOW ARE SYMPTOMS THAT SHOULD BE REPORTED IMMEDIATELY: *FEVER GREATER THAN 100.4 F (38 C) OR HIGHER *CHILLS OR SWEATING *NAUSEA AND VOMITING THAT IS NOT CONTROLLED WITH YOUR NAUSEA MEDICATION *UNUSUAL SHORTNESS OF BREATH *UNUSUAL BRUISING OR BLEEDING *URINARY PROBLEMS (pain or burning when urinating, or frequent urination) *BOWEL PROBLEMS (unusual diarrhea, constipation, pain near the anus) TENDERNESS IN MOUTH AND THROAT WITH OR WITHOUT PRESENCE OF ULCERS (sore throat, sores in mouth, or a toothache) UNUSUAL RASH, SWELLING OR PAIN  UNUSUAL VAGINAL DISCHARGE OR ITCHING   Items with * indicate a potential emergency and should be followed up as soon as possible or go to the Emergency Department if any problems should occur.  Please show the CHEMOTHERAPY ALERT CARD or IMMUNOTHERAPY ALERT CARD at check-in to the Emergency Department and triage nurse.  Should you have questions after your visit  or need to cancel or reschedule your appointment, please contact [place] [facility]  [phone] and follow the prompts.  Office hours are 8:00 a.m. to 4:30 p.m. Monday - Friday. Please note that voicemails left after 4:00 p.m. may not be returned until the following business day.  We are closed weekends and major holidays. You have access to a nurse at all times for urgent questions. Please call the main number to the clinic [phone] and follow the prompts.  For any non-urgent questions, you may also contact your provider using MyChart. We now offer e-Visits for anyone 18 and older to request care online for non-urgent symptoms. For details visit [email or web address].   Also download the MyChart app! Go to the app store, search "MyChart", open the app, select [place], and log in with your MyChart username and password.    

## 2022-09-02 MED FILL — Dexamethasone Sodium Phosphate Inj 100 MG/10ML: INTRAMUSCULAR | Qty: 2 | Status: AC

## 2022-09-03 ENCOUNTER — Inpatient Hospital Stay: Payer: BC Managed Care – PPO

## 2022-09-03 ENCOUNTER — Other Ambulatory Visit: Payer: BC Managed Care – PPO

## 2022-09-03 ENCOUNTER — Ambulatory Visit: Payer: BC Managed Care – PPO

## 2022-09-03 VITALS — BP 150/95 | HR 79 | Temp 97.1°F | Resp 18

## 2022-09-03 DIAGNOSIS — R5382 Chronic fatigue, unspecified: Secondary | ICD-10-CM

## 2022-09-03 DIAGNOSIS — Z79899 Other long term (current) drug therapy: Secondary | ICD-10-CM | POA: Diagnosis not present

## 2022-09-03 DIAGNOSIS — C9002 Multiple myeloma in relapse: Secondary | ICD-10-CM | POA: Diagnosis not present

## 2022-09-03 DIAGNOSIS — R0602 Shortness of breath: Secondary | ICD-10-CM | POA: Diagnosis not present

## 2022-09-03 DIAGNOSIS — Z5112 Encounter for antineoplastic immunotherapy: Secondary | ICD-10-CM | POA: Diagnosis not present

## 2022-09-03 LAB — CBC WITH DIFFERENTIAL (CANCER CENTER ONLY)
Abs Immature Granulocytes: 0.02 10*3/uL (ref 0.00–0.07)
Basophils Absolute: 0 10*3/uL (ref 0.0–0.1)
Basophils Relative: 0 %
Eosinophils Absolute: 0.2 10*3/uL (ref 0.0–0.5)
Eosinophils Relative: 3 %
HCT: 44.6 % (ref 39.0–52.0)
Hemoglobin: 15.6 g/dL (ref 13.0–17.0)
Immature Granulocytes: 0 %
Lymphocytes Relative: 19 %
Lymphs Abs: 1 10*3/uL (ref 0.7–4.0)
MCH: 32.4 pg (ref 26.0–34.0)
MCHC: 35 g/dL (ref 30.0–36.0)
MCV: 92.5 fL (ref 80.0–100.0)
Monocytes Absolute: 0.5 10*3/uL (ref 0.1–1.0)
Monocytes Relative: 9 %
Neutro Abs: 3.7 10*3/uL (ref 1.7–7.7)
Neutrophils Relative %: 69 %
Platelet Count: 166 10*3/uL (ref 150–400)
RBC: 4.82 MIL/uL (ref 4.22–5.81)
RDW: 14.2 % (ref 11.5–15.5)
WBC Count: 5.5 10*3/uL (ref 4.0–10.5)
nRBC: 0 % (ref 0.0–0.2)

## 2022-09-03 LAB — CMP (CANCER CENTER ONLY)
ALT: 21 U/L (ref 0–44)
AST: 22 U/L (ref 15–41)
Albumin: 4.1 g/dL (ref 3.5–5.0)
Alkaline Phosphatase: 117 U/L (ref 38–126)
Anion gap: 6 (ref 5–15)
BUN: 17 mg/dL (ref 6–20)
CO2: 24 mmol/L (ref 22–32)
Calcium: 8.8 mg/dL — ABNORMAL LOW (ref 8.9–10.3)
Chloride: 106 mmol/L (ref 98–111)
Creatinine: 1.35 mg/dL — ABNORMAL HIGH (ref 0.61–1.24)
GFR, Estimated: >60 mL/min (ref >60)
Glucose, Bld: 89 mg/dL (ref 70–99)
Potassium: 3.9 mmol/L (ref 3.5–5.1)
Sodium: 136 mmol/L (ref 135–145)
Total Bilirubin: 0.8 mg/dL (ref 0.3–1.2)
Total Protein: 6.7 g/dL (ref 6.5–8.1)

## 2022-09-03 MED ORDER — DEXTROSE 5 % IV SOLN
27.0000 mg/m2 | Freq: Once | INTRAVENOUS | Status: AC
  Administered 2022-09-03: 50 mg via INTRAVENOUS
  Filled 2022-09-03: qty 15

## 2022-09-03 MED ORDER — HEPARIN SOD (PORK) LOCK FLUSH 100 UNIT/ML IV SOLN
500.0000 [IU] | Freq: Once | INTRAVENOUS | Status: AC | PRN
  Administered 2022-09-03: 500 [IU]
  Filled 2022-09-03: qty 5

## 2022-09-03 MED ORDER — SODIUM CHLORIDE 0.9 % IV SOLN
20.0000 mg | Freq: Once | INTRAVENOUS | Status: AC
  Administered 2022-09-03: 20 mg via INTRAVENOUS
  Filled 2022-09-03: qty 2

## 2022-09-03 MED ORDER — SODIUM CHLORIDE 0.9 % IV SOLN
Freq: Once | INTRAVENOUS | Status: AC
  Filled 2022-09-03: qty 250

## 2022-09-03 NOTE — Patient Instructions (Signed)
[facility] [place] [facility]  Discharge Instructions: Thank you for choosing [facility] to provide your oncology and hematology care.  If you have a lab appointment with the Cancer Center, please go directly to the Cancer Center and check in at the registration area.  Wear comfortable clothing and clothing appropriate for easy access to any Portacath or PICC line.   We strive to give you quality time with your provider. You may need to reschedule your appointment if you arrive late (15 or more minutes).  Arriving late affects you and other patients whose appointments are after yours.  Also, if you miss three or more appointments without notifying the office, you may be dismissed from the clinic at the provider's discretion.      For prescription refill requests, have your pharmacy contact our office and allow 72 hours for refills to be completed.    Today you received the following chemotherapy and/or immunotherapy agents Kyprolis      To help prevent nausea and vomiting after your treatment, we encourage you to take your nausea medication as directed.  BELOW ARE SYMPTOMS THAT SHOULD BE REPORTED IMMEDIATELY: *FEVER GREATER THAN 100.4 F (38 C) OR HIGHER *CHILLS OR SWEATING *NAUSEA AND VOMITING THAT IS NOT CONTROLLED WITH YOUR NAUSEA MEDICATION *UNUSUAL SHORTNESS OF BREATH *UNUSUAL BRUISING OR BLEEDING *URINARY PROBLEMS (pain or burning when urinating, or frequent urination) *BOWEL PROBLEMS (unusual diarrhea, constipation, pain near the anus) TENDERNESS IN MOUTH AND THROAT WITH OR WITHOUT PRESENCE OF ULCERS (sore throat, sores in mouth, or a toothache) UNUSUAL RASH, SWELLING OR PAIN  UNUSUAL VAGINAL DISCHARGE OR ITCHING   Items with * indicate a potential emergency and should be followed up as soon as possible or go to the Emergency Department if any problems should occur.  Please show the CHEMOTHERAPY ALERT CARD or IMMUNOTHERAPY ALERT CARD at check-in to  the Emergency Department and triage nurse.  Should you have questions after your visit or need to cancel or reschedule your appointment, please contact [place] [facility] [place] [facility]  [phone] and follow the prompts.  Office hours are 8:00 a.m. to 4:30 p.m. Monday - Friday. Please note that voicemails left after 4:00 p.m. may not be returned until the following business day.  We are closed weekends and major holidays. You have access to a nurse at all times for urgent questions. Please call the main number to the clinic [phone] and follow the prompts.  For any non-urgent questions, you may also contact your provider using MyChart. We now offer e-Visits for anyone 18 and older to request care online for non-urgent symptoms. For details visit [email or web address].   Also download the MyChart app! Go to the app store, search "MyChart", open the app, select [place], and log in with your MyChart username and password.    

## 2022-09-04 LAB — KAPPA/LAMBDA LIGHT CHAINS
Kappa free light chain: 35.3 mg/L — ABNORMAL HIGH (ref 3.3–19.4)
Kappa, lambda light chain ratio: 4.84 — ABNORMAL HIGH (ref 0.26–1.65)
Lambda free light chains: 7.3 mg/L (ref 5.7–26.3)

## 2022-09-06 LAB — MULTIPLE MYELOMA PANEL, SERUM
Albumin SerPl Elph-Mcnc: 3.7 g/dL (ref 2.9–4.4)
Albumin/Glob SerPl: 1.6 (ref 0.7–1.7)
Alpha 1: 0.2 g/dL (ref 0.0–0.4)
Alpha2 Glob SerPl Elph-Mcnc: 0.7 g/dL (ref 0.4–1.0)
B-Globulin SerPl Elph-Mcnc: 1 g/dL (ref 0.7–1.3)
Gamma Glob SerPl Elph-Mcnc: 0.5 g/dL (ref 0.4–1.8)
Globulin, Total: 2.4 g/dL (ref 2.2–3.9)
IgA: 70 mg/dL — ABNORMAL LOW (ref 90–386)
IgG (Immunoglobin G), Serum: 607 mg/dL (ref 603–1613)
IgM (Immunoglobulin M), Srm: 41 mg/dL (ref 20–172)
M Protein SerPl Elph-Mcnc: 0.2 g/dL — ABNORMAL HIGH
Total Protein ELP: 6.1 g/dL (ref 6.0–8.5)

## 2022-09-09 MED FILL — Dexamethasone Sodium Phosphate Inj 100 MG/10ML: INTRAMUSCULAR | Qty: 2 | Status: AC

## 2022-09-10 ENCOUNTER — Inpatient Hospital Stay: Payer: BC Managed Care – PPO

## 2022-09-10 ENCOUNTER — Other Ambulatory Visit: Payer: Self-pay

## 2022-09-10 ENCOUNTER — Inpatient Hospital Stay (HOSPITAL_BASED_OUTPATIENT_CLINIC_OR_DEPARTMENT_OTHER): Payer: BC Managed Care – PPO | Admitting: Hospice and Palliative Medicine

## 2022-09-10 ENCOUNTER — Encounter: Payer: Self-pay | Admitting: Oncology

## 2022-09-10 ENCOUNTER — Inpatient Hospital Stay (HOSPITAL_BASED_OUTPATIENT_CLINIC_OR_DEPARTMENT_OTHER): Payer: BC Managed Care – PPO | Admitting: Oncology

## 2022-09-10 VITALS — BP 153/98 | HR 81 | Temp 97.9°F | Resp 18 | Ht 68.0 in | Wt 155.3 lb

## 2022-09-10 DIAGNOSIS — Z5111 Encounter for antineoplastic chemotherapy: Secondary | ICD-10-CM | POA: Diagnosis not present

## 2022-09-10 DIAGNOSIS — Z79899 Other long term (current) drug therapy: Secondary | ICD-10-CM | POA: Diagnosis not present

## 2022-09-10 DIAGNOSIS — C9002 Multiple myeloma in relapse: Secondary | ICD-10-CM | POA: Diagnosis not present

## 2022-09-10 DIAGNOSIS — R5383 Other fatigue: Secondary | ICD-10-CM | POA: Diagnosis not present

## 2022-09-10 DIAGNOSIS — Z515 Encounter for palliative care: Secondary | ICD-10-CM | POA: Diagnosis not present

## 2022-09-10 DIAGNOSIS — T451X5A Adverse effect of antineoplastic and immunosuppressive drugs, initial encounter: Secondary | ICD-10-CM

## 2022-09-10 DIAGNOSIS — Z5112 Encounter for antineoplastic immunotherapy: Secondary | ICD-10-CM | POA: Diagnosis not present

## 2022-09-10 LAB — CBC WITH DIFFERENTIAL (CANCER CENTER ONLY)
Abs Immature Granulocytes: 0.04 10*3/uL (ref 0.00–0.07)
Basophils Absolute: 0 10*3/uL (ref 0.0–0.1)
Basophils Relative: 1 %
Eosinophils Absolute: 0.3 10*3/uL (ref 0.0–0.5)
Eosinophils Relative: 4 %
HCT: 43.6 % (ref 39.0–52.0)
Hemoglobin: 15.1 g/dL (ref 13.0–17.0)
Immature Granulocytes: 1 %
Lymphocytes Relative: 19 %
Lymphs Abs: 1.3 10*3/uL (ref 0.7–4.0)
MCH: 31.9 pg (ref 26.0–34.0)
MCHC: 34.6 g/dL (ref 30.0–36.0)
MCV: 92 fL (ref 80.0–100.0)
Monocytes Absolute: 0.5 10*3/uL (ref 0.1–1.0)
Monocytes Relative: 8 %
Neutro Abs: 4.4 10*3/uL (ref 1.7–7.7)
Neutrophils Relative %: 67 %
Platelet Count: 151 10*3/uL (ref 150–400)
RBC: 4.74 MIL/uL (ref 4.22–5.81)
RDW: 14 % (ref 11.5–15.5)
WBC Count: 6.5 10*3/uL (ref 4.0–10.5)
nRBC: 0 % (ref 0.0–0.2)

## 2022-09-10 LAB — CMP (CANCER CENTER ONLY)
ALT: 22 U/L (ref 0–44)
AST: 24 U/L (ref 15–41)
Albumin: 4.1 g/dL (ref 3.5–5.0)
Alkaline Phosphatase: 113 U/L (ref 38–126)
Anion gap: 8 (ref 5–15)
BUN: 10 mg/dL (ref 6–20)
CO2: 23 mmol/L (ref 22–32)
Calcium: 8.9 mg/dL (ref 8.9–10.3)
Chloride: 107 mmol/L (ref 98–111)
Creatinine: 1.41 mg/dL — ABNORMAL HIGH (ref 0.61–1.24)
GFR, Estimated: >60 mL/min (ref >60)
Glucose, Bld: 110 mg/dL — ABNORMAL HIGH (ref 70–99)
Potassium: 3.3 mmol/L — ABNORMAL LOW (ref 3.5–5.1)
Sodium: 138 mmol/L (ref 135–145)
Total Bilirubin: 1 mg/dL (ref 0.3–1.2)
Total Protein: 6.5 g/dL (ref 6.5–8.1)

## 2022-09-10 MED ORDER — SODIUM CHLORIDE 0.9 % IV SOLN
Freq: Once | INTRAVENOUS | Status: AC
  Filled 2022-09-10: qty 250

## 2022-09-10 MED ORDER — DEXTROSE 5 % IV SOLN
27.0000 mg/m2 | Freq: Once | INTRAVENOUS | Status: AC
  Administered 2022-09-10: 50 mg via INTRAVENOUS
  Filled 2022-09-10: qty 15

## 2022-09-10 MED ORDER — POTASSIUM CHLORIDE CRYS ER 20 MEQ PO TBCR
40.0000 meq | EXTENDED_RELEASE_TABLET | Freq: Two times a day (BID) | ORAL | 0 refills | Status: DC
  Filled 2022-09-10: qty 120, 30d supply, fill #0

## 2022-09-10 MED ORDER — SODIUM CHLORIDE 0.9 % IV SOLN
20.0000 mg | Freq: Once | INTRAVENOUS | Status: AC
  Administered 2022-09-10: 20 mg via INTRAVENOUS
  Filled 2022-09-10: qty 20

## 2022-09-10 MED ORDER — HEPARIN SOD (PORK) LOCK FLUSH 100 UNIT/ML IV SOLN
500.0000 [IU] | Freq: Once | INTRAVENOUS | Status: AC | PRN
  Administered 2022-09-10: 500 [IU]
  Filled 2022-09-10: qty 5

## 2022-09-10 NOTE — Progress Notes (Signed)
Palliative Medicine [facility] at [facility] [phone] [phone]   Name: James House. Date: 09/10/2022 MRN: [id number]  DOB: [birth year]  Patient Care Team: Doreene Nest, NP as PCP - General (Internal Medicine) Martin Majestic, MD as PCP - Hematology/Oncology Creig Hines, MD as Consulting Physician (Hematology and Oncology)    REASON FOR CONSULTATION: James Boyce. is a 52 y.o. male with multiple medical problems including kappa light chain myeloma status post stem cell transplant in 2018 followed by Revlimid maintenance.  Repeat bone marrow biopsy in 2023 suggested persistent plasma cell neoplasm.  Patient was restarted on treatment with Darzalex Pomalyst and dexamethasone regimen.  He has history of chronic pain and was previously managed by the pain clinic prior to being discharged due to UDS positive for THC.  Patient has had some difficulty with insomnia.  He was referred to palliative care to address goals and manage ongoing symptoms.  SOCIAL HISTORY:     reports that he has quit smoking. His smokeless tobacco use includes snuff. He reports that he does not drink alcohol and does not use drugs.  Patient is unmarried.  He lives at home with his daughter.  He has another daughter lives nearby.  Patient works in a [employer].  ADVANCE DIRECTIVES:    CODE STATUS:   PAST MEDICAL HISTORY: Past Medical History:  Diagnosis Date   CKD (chronic kidney disease) stage 3, GFR 30-59 ml/min (HCC)    Hypertension    Multiple myeloma (HCC) 08/11/2017   Neuropathy    Pneumonia    Severe sepsis (HCC) 08/11/2017   Shingles 08/03/2019    PAST SURGICAL HISTORY:  Past Surgical History:  Procedure Laterality Date   ABDOMINAL SURGERY     COLONOSCOPY WITH PROPOFOL N/A 05/25/2021   Procedure: COLONOSCOPY WITH PROPOFOL;  Surgeon: Toney Reil, MD;  Location: [facility] ENDOSCOPY;  Service: Gastroenterology;  Laterality:  N/A;   IR IMAGING GUIDED PORT INSERTION  08/02/2022   JOINT REPLACEMENT     right knee   KNEE SURGERY Left     HEMATOLOGY/ONCOLOGY HISTORY:  Oncology History  Multiple myeloma (HCC)  08/11/2017 Initial Diagnosis   Multiple myeloma (HCC)   10/22/2021 Cancer Staging   Staging form: Plasma Cell Myeloma and Plasma Cell Disorders, AJCC 8th Edition - Clinical stage from 10/22/2021: High-risk cytogenetics: Unknown, LDH: Normal - Signed by Creig Hines, MD on 10/22/2021 Stage prefix: Recurrence Cytogenetics: Unknown   06/04/2022 - 07/23/2022 Chemotherapy   Patient is on Treatment Plan : MYELOMA RELAPSED REFRACTORY Daratumumab SQ + Pomalidomide + Dexamethasone (DaraPd) q28d     07/30/2022 -  Chemotherapy   Patient is on Treatment Plan : MYELOMA RELAPSED/ REFRACTORY Carfilzomib D1,8,15 (20/27) + Pomalidomide + Dexamethasone (KPd) q28d       ALLERGIES:  has No Known Allergies.  MEDICATIONS:  Current Outpatient Medications  Medication Sig Dispense Refill   acyclovir (ZOVIRAX) 400 MG tablet Take 1 tablet (400 mg total) by mouth 2 (two) times daily. 60 tablet 2   amitriptyline (ELAVIL) 10 MG tablet Take 1 tablet (10 mg total) by mouth at bedtime. 30 tablet 1   aspirin EC 81 MG tablet Take by mouth.     baclofen (LIORESAL) 10 MG tablet Take 1 tablet (10 mg total) by mouth 4 (four) times daily as needed for muscle spasms. 360 tablet 0   celecoxib (CELEBREX) 200 MG capsule Take 1 capsule (200 mg total) by mouth daily. 30 capsule  2   cyanocobalamin (VITAMIN B12) 1000 MCG/ML injection Inject 1 mL (1,000 mcg total) into the muscle every 30 (thirty) days. 1 mL 11   dexamethasone (DECADRON) 4 MG tablet Take 5 tablets (20 mg total) by mouth once a week. Take first dose 1 hour prior to reporting to clinic for daratumumab. Take 2nd dose the day after daratumumab. 20 tablet 2   DULoxetine (CYMBALTA) 60 MG capsule Take 1 capsule (60 mg total) by mouth daily. 60 capsule 1   levothyroxine (SYNTHROID) 50 MCG tablet  Take 1 tablet (50 mcg total) by mouth daily before breakfast. 30 tablet 2   lidocaine-prilocaine (EMLA) cream Apply 1 Application topically as needed. 30 g 0   montelukast (SINGULAIR) 10 MG tablet Take 1 tablet in the evening 1 day before your treatment. Day of treatment take 1 tablet in the morning for the first 2 treatments of Daratumumab 4 tablet 0   OLANZapine (ZYPREXA) 10 MG tablet Take 1 tablet (10 mg total) by mouth at bedtime. To help with nausea prevention. 30 tablet 1   OLANZapine (ZYPREXA) 10 MG tablet Take by mouth.     oxyCODONE ER (XTAMPZA ER) 9 MG C12A Take 9 mg by mouth every 12 (twelve) hours. 60 capsule 0   Oxycodone HCl 10 MG TABS Take 1 tablet (10 mg total) by mouth every 8 (eight) hours as needed. 90 tablet 0   pomalidomide (POMALYST) 2 MG capsule Take 1 capsule (2 mg total) by mouth daily. Take 1 tablet daily for 21 days and 7 days off , Rems [id number] obtained 5/6 21 capsule 0   potassium chloride SA (KLOR-CON M) 20 MEQ tablet Take 1 tablet (20 mEq total) by mouth as directed. Take 2 tablets in morning and 1 in evening for 3 weeks 63 tablet 0   pregabalin (LYRICA) 300 MG capsule Take 1 capsule (300 mg total) by mouth 2 (two) times daily. 60 capsule 3   prochlorperazine (COMPAZINE) 10 MG tablet Take by mouth.     No current facility-administered medications for this visit.   Facility-Administered Medications Ordered in Other Visits  Medication Dose Route Frequency Provider Last Rate Last Admin   carfilzomib (KYPROLIS) 50 mg in dextrose 5 % 50 mL chemo infusion  27 mg/m2 (Treatment Plan Recorded) Intravenous Once Creig Hines, MD       heparin lock flush 100 unit/mL  500 Units Intracatheter Once PRN Creig Hines, MD        VITAL SIGNS: There were no vitals taken for this visit. There were no vitals filed for this visit.  Estimated body mass index is 23.61 kg/m as calculated from the following:   Height as of an earlier encounter on 09/10/22: 5\' 8"  (1.727 m).   Weight  as of an earlier encounter on 09/10/22: 155 lb 4.8 oz (70.4 kg).  LABS: CBC:    Component Value Date/Time   WBC 6.5 09/10/2022 0841   WBC 5.2 07/23/2022 0836   HGB 15.1 09/10/2022 0841   HCT 43.6 09/10/2022 0841   PLT 151 09/10/2022 0841   MCV 92.0 09/10/2022 0841   NEUTROABS 4.4 09/10/2022 0841   LYMPHSABS 1.3 09/10/2022 0841   MONOABS 0.5 09/10/2022 0841   EOSABS 0.3 09/10/2022 0841   BASOSABS 0.0 09/10/2022 0841   Comprehensive Metabolic Panel:    Component Value Date/Time   NA 138 09/10/2022 0841   NA 140 11/26/2017 1307   K 3.3 (L) 09/10/2022 0841   CL 107 09/10/2022 0841   CO2  23 09/10/2022 0841   BUN 10 09/10/2022 0841   BUN 11 11/26/2017 1307   CREATININE 1.41 (H) 09/10/2022 0841   GLUCOSE 110 (H) 09/10/2022 0841   CALCIUM 8.9 09/10/2022 0841   AST 24 09/10/2022 0841   ALT 22 09/10/2022 0841   ALKPHOS 113 09/10/2022 0841   BILITOT 1.0 09/10/2022 0841   PROT 6.5 09/10/2022 0841   PROT 7.0 11/26/2017 1307   ALBUMIN 4.1 09/10/2022 0841   ALBUMIN 4.3 11/26/2017 1307    RADIOGRAPHIC STUDIES: No results found.  PERFORMANCE STATUS (ECOG) : 1 - Symptomatic but completely ambulatory  Review of Systems Unless otherwise noted, a complete review of systems is negative.  Physical Exam General: NAD Pulmonary: Unlabored Extremities: no edema, no joint deformities Skin: no rashes Neurological: Grossly nonfocal  IMPRESSION: Follow-up visit.  Patient seen in infusion.  Patient reports he is doing reasonably well.  He denies significant changes or concerns today.  No new symptomatic complaints.  He continues to endorse neuropathy but states that he did not tolerate higher dose of duloxetine and is now back to 30 mg daily, which he finds preferable.  He says that he is "tolerating" symptoms.  Patient says that things have been somewhat better after he took short-term disability and is no longer working.  Patient is tolerating Xtampza ER/oxycodone.  PLAN: -Continue  current scope of treatment -Continue amitriptyline 10 mg nightly -Continue oxycodone/Xtampza -Follow-up telephone visit 1 to 2 months   Patient expressed understanding and was in agreement with this plan. He also understands that He can call the clinic at any time with any questions, concerns, or complaints.     Time Total: 15 minutes  Visit consisted of counseling and education dealing with the complex and emotionally intense issues of symptom management and palliative care in the setting of serious and potentially life-threatening illness.Greater than 50%  of this time was spent counseling and coordinating care related to the above assessment and plan.  Signed by: Laurette Schimke, PhD, NP-C

## 2022-09-10 NOTE — Patient Instructions (Signed)
[facility] [place] [facility]  Discharge Instructions: Thank you for choosing [facility] to provide your oncology and hematology care.  If you have a lab appointment with the Cancer Center, please go directly to the Cancer Center and check in at the registration area.  Wear comfortable clothing and clothing appropriate for easy access to any Portacath or PICC line.   We strive to give you quality time with your provider. You may need to reschedule your appointment if you arrive late (15 or more minutes).  Arriving late affects you and other patients whose appointments are after yours.  Also, if you miss three or more appointments without notifying the office, you may be dismissed from the clinic at the provider's discretion.      For prescription refill requests, have your pharmacy contact our office and allow 72 hours for refills to be completed.    Today you received the following chemotherapy and/or immunotherapy agents Carfilzomib.      To help prevent nausea and vomiting after your treatment, we encourage you to take your nausea medication as directed.  BELOW ARE SYMPTOMS THAT SHOULD BE REPORTED IMMEDIATELY: *FEVER GREATER THAN 100.4 F (38 C) OR HIGHER *CHILLS OR SWEATING *NAUSEA AND VOMITING THAT IS NOT CONTROLLED WITH YOUR NAUSEA MEDICATION *UNUSUAL SHORTNESS OF BREATH *UNUSUAL BRUISING OR BLEEDING *URINARY PROBLEMS (pain or burning when urinating, or frequent urination) *BOWEL PROBLEMS (unusual diarrhea, constipation, pain near the anus) TENDERNESS IN MOUTH AND THROAT WITH OR WITHOUT PRESENCE OF ULCERS (sore throat, sores in mouth, or a toothache) UNUSUAL RASH, SWELLING OR PAIN  UNUSUAL VAGINAL DISCHARGE OR ITCHING   Items with * indicate a potential emergency and should be followed up as soon as possible or go to the Emergency Department if any problems should occur.  Please show the CHEMOTHERAPY ALERT CARD or IMMUNOTHERAPY ALERT CARD at check-in  to the Emergency Department and triage nurse.  Should you have questions after your visit or need to cancel or reschedule your appointment, please contact [facility] [place] [facility]  [phone] and follow the prompts.  Office hours are 8:00 a.m. to 4:30 p.m. Monday - Friday. Please note that voicemails left after 4:00 p.m. may not be returned until the following business day.  We are closed weekends and major holidays. You have access to a nurse at all times for urgent questions. Please call the main number to the clinic [phone] and follow the prompts.  For any non-urgent questions, you may also contact your provider using MyChart. We now offer e-Visits for anyone 18 and older to request care online for non-urgent symptoms. For details visit [email or web address].   Also download the MyChart app! Go to the app store, search "MyChart", open the app, select [facility], and log in with your MyChart username and password.    

## 2022-09-10 NOTE — Patient Instructions (Signed)

## 2022-09-10 NOTE — Progress Notes (Signed)
Hematology/Oncology Consult note [facility] ([facility])  [phone] [phone]  Patient Care Team: Doreene Nest, NP as PCP - General (Internal Medicine) Martin Majestic, MD as PCP - Hematology/Oncology Creig Hines, MD as Consulting Physician (Hematology and Oncology)   Name of the patient: James House  [id number]  [birth year]   Date of visit: 09/10/22  Diagnosis- kappa light chain multiple myeloma in relapse    Chief complaint/ Reason for visit-on treatment assessment prior to cycle 3-day 15 of carfilzomib  Heme/Onc history: Patient is a 52- year-old male with a history of kappa light chain myeloma that was treated by [name].  History is as follows:   1.  Patient presented with renal insufficiency with a creatinine of 2.4 in April 2018.  Kidney biopsy showed myeloma cast nephropathy.  Kappa light chain was 3004 and lambda 0.22 with a kappa lambda ratio of 13,000 655.  Skeletal survey showed multiple calvarial and marrow lesions.  Bone marrow biopsy in May 2018 showed 43% plasma cells.  He received CyBorD for cycle 1 in May 2018 and was subsequently switched to RVD.  He received 4 cycles followed by a repeat bone marrow biopsy in August 2018 which showed no increase in plasma cells.   2.  He underwent autologous stem cell transplantation on 01/30/2017.  He was started on Revlimid maintenance 10 mg daily in January 2019.   3.  Labs from January February and March 2019 done at [facility] showed no M spike, normal kappa lambda ratio of 1.06.  He was last seen by them in April 2019.   4. Following that patient was admitted to [facility] for healthcare associated pneumonia and was recently discharged.  He wished to transfer his care to [place] at this time.  He was on maintenance Revlimid 5 mg which was then reduced to 2.5 mg.  Follows up with pain clinic for chemo-induced peripheral neuropathy with [name].  His urine tested positive for [facility] and therefore  patient was discharged from pain clinic   5.  Noted to have gradually increasing free kappa light chainFrom 15 in 20 21-39.5 with a ratio of 3.09.  He was seen by [facility] again and underwent a repeat bone marrow biopsy which did not show any evidence of clonal plasma cells.  PET CT scan also did not show any new active lytic lesions in May 2023.Marland Kitchen  Revlimid dose was increased to 15 mg 3 weeks on 1 week off but kappa light chains show continuing to increase   6.  PET CT scan in December 2023 did not show any evidence of active myeloma.  Patient underwent repeat bone marrow biopsy at [facility] which showed 6% plasma cellsConsistent with persistent plasma cell neoplasm.  74 to 86% of isolated plasma cells show loss of T p53 and monosomy 13.  Patient was seen by [name] at [facility] and was recommended Darzalex Pomalyst dexamethasone regimen   7.  Disease progression after 8 weekly cycles of Darazalex kappa light chain increased from 332-646 with a ratio that went up from 39-57 concerning for disease progression.  Patient switched to carfilzomib Pomalyst dexamethasone regimen    Interval history-patient reports chronic fatigue and generalized bodyaches.  He has bilateral knee pain as well as low back pain which is chronic.  Feels that his back pain has been somewhat worse since starting the new treatment.He is oxycodone as well as Lyrica and OxyContin.  ECOG PS- 1 Pain scale- 5 Opioid associated constipation-  no  Review of systems- Review of Systems  Constitutional:  Positive for malaise/fatigue. Negative for chills, fever and weight loss.  HENT:  Negative for congestion, ear discharge and nosebleeds.   Eyes:  Negative for blurred vision.  Respiratory:  Negative for cough, hemoptysis, sputum production, shortness of breath and wheezing.   Cardiovascular:  Negative for chest pain, palpitations, orthopnea and claudication.  Gastrointestinal:  Negative for abdominal pain, blood in stool, constipation, diarrhea,  heartburn, melena, nausea and vomiting.  Genitourinary:  Negative for dysuria, flank pain, frequency, hematuria and urgency.  Musculoskeletal:  Positive for back pain, joint pain and myalgias.  Skin:  Negative for rash.  Neurological:  Positive for sensory change (Peripheral neuropathy). Negative for dizziness, tingling, focal weakness, seizures, weakness and headaches.  Endo/Heme/Allergies:  Does not bruise/bleed easily.  Psychiatric/Behavioral:  Negative for depression and suicidal ideas. The patient does not have insomnia.       No Known Allergies   Past Medical History:  Diagnosis Date   CKD (chronic kidney disease) stage 3, GFR 30-59 ml/min (HCC)    Hypertension    Multiple myeloma (HCC) 08/11/2017   Neuropathy    Pneumonia    Severe sepsis (HCC) 08/11/2017   Shingles 08/03/2019     Past Surgical History:  Procedure Laterality Date   ABDOMINAL SURGERY     COLONOSCOPY WITH PROPOFOL N/A 05/25/2021   Procedure: COLONOSCOPY WITH PROPOFOL;  Surgeon: Toney Reil, MD;  Location: [facility] ENDOSCOPY;  Service: Gastroenterology;  Laterality: N/A;   IR IMAGING GUIDED PORT INSERTION  08/02/2022   JOINT REPLACEMENT     right knee   KNEE SURGERY Left     Social History   Socioeconomic History   Marital status: Single    Spouse name: [name]   Number of children: 3   Years of education: Not on file   Highest education level: Not on file  Occupational History   Not on file  Tobacco Use   Smoking status: Former   Smokeless tobacco: Current    Types: Snuff  Vaping Use   Vaping Use: Never used  Substance and Sexual Activity   Alcohol use: No   Drug use: No   Sexual activity: Yes  Other Topics Concern   Not on file  Social History Narrative   Live in private residence with spouse and mother in law   Social Determinants of Health   Financial Resource Strain: Low Risk  (08/12/2017)   Overall Financial Resource Strain (CARDIA)    Difficulty of Paying Living Expenses: Not  hard at all  Food Insecurity: No Food Insecurity (08/12/2017)   Hunger Vital Sign    Worried About Running Out of Food in the Last Year: Never true    Ran Out of Food in the Last Year: Never true  Transportation Needs: No Transportation Needs (08/12/2017)   PRAPARE - Administrator, Civil Service (Medical): No    Lack of Transportation (Non-Medical): No  Physical Activity: Sufficiently Active (08/12/2017)   Exercise Vital Sign    Days of Exercise per Week: 7 days    Minutes of Exercise per Session: 30 min  Stress: No Stress Concern Present (08/12/2017)   [employer] of Occupational Health - Occupational Stress Questionnaire    Feeling of Stress : Only a little  Social Connections: Somewhat Isolated (08/12/2017)   Social Connection and Isolation Panel [NHANES]    Frequency of Communication with Friends and Family: Once a week    Frequency of Social Gatherings  with Friends and Family: Once a week    Attends Religious Services: More than 4 times per year    Active Member of Clubs or Organizations: No    Attends Banker Meetings: Never    Marital Status: Married  Catering manager Violence: Not At Risk (08/12/2017)   Humiliation, Afraid, Rape, and Kick questionnaire    Fear of Current or Ex-Partner: No    Emotionally Abused: No    Physically Abused: No    Sexually Abused: No    Family History  Problem Relation Age of Onset   Cancer Mother 53       Pancreatic   COPD Mother    Diabetes Mother    Hyperlipidemia Mother    Hypertension Mother    Cancer Maternal Uncle        pancreatic   Cancer Maternal Grandmother 39       colon   COPD Maternal Grandmother    Diabetes Maternal Grandmother    Hypertension Maternal Grandmother      Current Outpatient Medications:    acyclovir (ZOVIRAX) 400 MG tablet, Take 1 tablet (400 mg total) by mouth 2 (two) times daily., Disp: 60 tablet, Rfl: 2   amitriptyline (ELAVIL) 10 MG tablet, Take 1 tablet (10 mg total) by  mouth at bedtime., Disp: 30 tablet, Rfl: 1   aspirin EC 81 MG tablet, Take by mouth., Disp: , Rfl:    baclofen (LIORESAL) 10 MG tablet, Take 1 tablet (10 mg total) by mouth 4 (four) times daily as needed for muscle spasms., Disp: 360 tablet, Rfl: 0   celecoxib (CELEBREX) 200 MG capsule, Take 1 capsule (200 mg total) by mouth daily., Disp: 30 capsule, Rfl: 2   cyanocobalamin (VITAMIN B12) 1000 MCG/ML injection, Inject 1 mL (1,000 mcg total) into the muscle every 30 (thirty) days., Disp: 1 mL, Rfl: 11   dexamethasone (DECADRON) 4 MG tablet, Take 5 tablets (20 mg total) by mouth once a week. Take first dose 1 hour prior to reporting to clinic for daratumumab. Take 2nd dose the day after daratumumab., Disp: 20 tablet, Rfl: 2   DULoxetine (CYMBALTA) 60 MG capsule, Take 1 capsule (60 mg total) by mouth daily., Disp: 60 capsule, Rfl: 1   levothyroxine (SYNTHROID) 50 MCG tablet, Take 1 tablet (50 mcg total) by mouth daily before breakfast., Disp: 30 tablet, Rfl: 2   lidocaine-prilocaine (EMLA) cream, Apply 1 Application topically as needed., Disp: 30 g, Rfl: 0   montelukast (SINGULAIR) 10 MG tablet, Take 1 tablet in the evening 1 day before your treatment. Day of treatment take 1 tablet in the morning for the first 2 treatments of Daratumumab, Disp: 4 tablet, Rfl: 0   OLANZapine (ZYPREXA) 10 MG tablet, Take 1 tablet (10 mg total) by mouth at bedtime. To help with nausea prevention., Disp: 30 tablet, Rfl: 1   OLANZapine (ZYPREXA) 10 MG tablet, Take by mouth., Disp: , Rfl:    oxyCODONE ER (XTAMPZA ER) 9 MG C12A, Take 9 mg by mouth every 12 (twelve) hours., Disp: 60 capsule, Rfl: 0   Oxycodone HCl 10 MG TABS, Take 1 tablet (10 mg total) by mouth every 8 (eight) hours as needed., Disp: 90 tablet, Rfl: 0   pomalidomide (POMALYST) 2 MG capsule, Take 1 capsule (2 mg total) by mouth daily. Take 1 tablet daily for 21 days and 7 days off , Rems [id number] obtained 5/6, Disp: 21 capsule, Rfl: 0   potassium chloride SA  (KLOR-CON M) 20 MEQ tablet, Take 1  tablet (20 mEq total) by mouth as directed. Take 2 tablets in morning and 1 in evening for 3 weeks, Disp: 63 tablet, Rfl: 0   pregabalin (LYRICA) 300 MG capsule, Take 1 capsule (300 mg total) by mouth 2 (two) times daily., Disp: 60 capsule, Rfl: 3   prochlorperazine (COMPAZINE) 10 MG tablet, Take by mouth., Disp: , Rfl:  No current facility-administered medications for this visit.  Facility-Administered Medications Ordered in Other Visits:    carfilzomib (KYPROLIS) 50 mg in dextrose 5 % 50 mL chemo infusion, 27 mg/m2 (Treatment Plan Recorded), Intravenous, Once, Creig Hines, MD   dexamethasone (DECADRON) 20 mg in sodium chloride 0.9 % 50 mL IVPB, 20 mg, Intravenous, Once, Creig Hines, MD, Last Rate: 208 mL/hr at 09/10/22 0950, 20 mg at 09/10/22 0950   heparin lock flush 100 unit/mL, 500 Units, Intracatheter, Once PRN, Creig Hines, MD  Physical exam:  Vitals:   09/10/22 0849  BP: (!) 153/98  Pulse: 81  Resp: 18  Temp: 97.9 F (36.6 C)  TempSrc: Tympanic  SpO2: 100%  Weight: 155 lb 4.8 oz (70.4 kg)  Height: 5\' 8"  (1.727 m)   Physical Exam Cardiovascular:     Rate and Rhythm: Normal rate and regular rhythm.     Heart sounds: Normal heart sounds.  Pulmonary:     Effort: Pulmonary effort is normal.     Breath sounds: Normal breath sounds.  Skin:    General: Skin is warm and dry.  Neurological:     Mental Status: He is alert and oriented to person, place, and time.         Latest Ref Rng & Units 09/10/2022    8:41 AM  CMP  Glucose 70 - 99 mg/dL 161   BUN 6 - 20 mg/dL 10   Creatinine 0.96 - 1.24 mg/dL 0.45   Sodium 409 - 811 mmol/L 138   Potassium 3.5 - 5.1 mmol/L 3.3   Chloride 98 - 111 mmol/L 107   CO2 22 - 32 mmol/L 23   Calcium 8.9 - 10.3 mg/dL 8.9   Total Protein 6.5 - 8.1 g/dL 6.5   Total Bilirubin 0.3 - 1.2 mg/dL 1.0   Alkaline Phos 38 - 126 U/L 113   AST 15 - 41 U/L 24   ALT 0 - 44 U/L 22       Latest Ref Rng & Units  09/10/2022    8:41 AM  CBC  WBC 4.0 - 10.5 K/uL 6.5   Hemoglobin 13.0 - 17.0 g/dL 91.4   Hematocrit 78.2 - 52.0 % 43.6   Platelets 150 - 400 K/uL 151     Assessment and plan- Patient is a 52 y.o. male with history of kappa light chain multiple myeloma currently in relapse.  He is here for on treatment assessment prior to cycle 2-day 15 of carfilzomib  Patient continues to do respond to carfilzomib Pomalyst regimen well.  Free light chainRatio is trending down from 57 a month ago presently to 4.84.  He is on Pomalyst 2 mg 2 weeks on and 1 week off which she will continue.  I am hesitant to decrease the doses of his treatment regimen at this time despite his generalized body aches and joint pains.  I am getting a baseline echocardiogram at this time as well as MRI lumbar spine with and without contrast given his ongoing back pain.  He did have a PET scan in January 2024 which did not show any evidence of active  myeloma.  He is therefore not taking any bisphosphonates at this time.  Continue aspirin and acyclovir prophylaxis  Chemo-induced peripheral neuropathy: He is on OxyContin and oxycodone and Lyrica.  We discussed adjusting his pain medications with his body aches.  He does not wish to change his regimen presently.  I will see him in 1 week for cycle 3-day 1 of treatment   Visit Diagnosis 1. Encounter for antineoplastic chemotherapy   2. Multiple myeloma in relapse (HCC)   3. High risk medication use   4. Chemotherapy-induced fatigue      Dr. Owens Shark, MD, MPH [facility] [phone] 09/10/2022 9:56 AM

## 2022-09-13 ENCOUNTER — Other Ambulatory Visit: Payer: Self-pay

## 2022-09-13 ENCOUNTER — Other Ambulatory Visit: Payer: Self-pay | Admitting: Oncology

## 2022-09-13 MED ORDER — OXYCODONE HCL 10 MG PO TABS
10.0000 mg | ORAL_TABLET | Freq: Three times a day (TID) | ORAL | 0 refills | Status: DC | PRN
  Filled 2022-09-13: qty 90, 30d supply, fill #0

## 2022-09-17 ENCOUNTER — Telehealth: Payer: Self-pay | Admitting: *Deleted

## 2022-09-17 ENCOUNTER — Other Ambulatory Visit: Payer: Self-pay | Admitting: *Deleted

## 2022-09-17 NOTE — Telephone Encounter (Signed)
Received FMLA from [employer] form completed and sent for doctor signature

## 2022-09-18 ENCOUNTER — Encounter: Payer: Self-pay | Admitting: Oncology

## 2022-09-18 NOTE — Telephone Encounter (Signed)
Form signed and faxed and copied for patient per Mercie Eon, RN and she also notified him about it

## 2022-09-19 ENCOUNTER — Encounter: Payer: Self-pay | Admitting: Oncology

## 2022-09-19 ENCOUNTER — Other Ambulatory Visit: Payer: Self-pay

## 2022-09-19 ENCOUNTER — Emergency Department
Admission: EM | Admit: 2022-09-19 | Discharge: 2022-09-19 | Disposition: A | Discharge status: Home / Self Care | Payer: BC Managed Care – PPO | Attending: Student in an Organized Health Care Education/Training Program | Admitting: Student in an Organized Health Care Education/Training Program

## 2022-09-19 ENCOUNTER — Ambulatory Visit: Payer: BC Managed Care – PPO

## 2022-09-19 ENCOUNTER — Emergency Department: Payer: BC Managed Care – PPO

## 2022-09-19 DIAGNOSIS — Z79899 Other long term (current) drug therapy: Secondary | ICD-10-CM | POA: Diagnosis not present

## 2022-09-19 DIAGNOSIS — Z1152 Encounter for screening for COVID-19: Secondary | ICD-10-CM | POA: Diagnosis not present

## 2022-09-19 DIAGNOSIS — R000 Tachycardia, unspecified: Secondary | ICD-10-CM | POA: Insufficient documentation

## 2022-09-19 DIAGNOSIS — R0981 Nasal congestion: Secondary | ICD-10-CM | POA: Insufficient documentation

## 2022-09-19 DIAGNOSIS — R5381 Other malaise: Secondary | ICD-10-CM | POA: Diagnosis not present

## 2022-09-19 DIAGNOSIS — R0602 Shortness of breath: Secondary | ICD-10-CM | POA: Diagnosis not present

## 2022-09-19 DIAGNOSIS — D72829 Elevated white blood cell count, unspecified: Secondary | ICD-10-CM | POA: Diagnosis not present

## 2022-09-19 DIAGNOSIS — R509 Fever, unspecified: Secondary | ICD-10-CM | POA: Diagnosis not present

## 2022-09-19 DIAGNOSIS — R059 Cough, unspecified: Secondary | ICD-10-CM | POA: Diagnosis not present

## 2022-09-19 DIAGNOSIS — R7401 Elevation of levels of liver transaminase levels: Secondary | ICD-10-CM | POA: Diagnosis not present

## 2022-09-19 LAB — COMPREHENSIVE METABOLIC PANEL
ALT: 24 U/L (ref 0–44)
AST: 31 U/L (ref 15–41)
Albumin: 3.8 g/dL (ref 3.5–5.0)
Alkaline Phosphatase: 101 U/L (ref 38–126)
Anion gap: 9 (ref 5–15)
BUN: 9 mg/dL (ref 6–20)
CO2: 21 mmol/L — ABNORMAL LOW (ref 22–32)
Calcium: 8.7 mg/dL — ABNORMAL LOW (ref 8.9–10.3)
Chloride: 107 mmol/L (ref 98–111)
Creatinine, Ser: 1.48 mg/dL — ABNORMAL HIGH (ref 0.61–1.24)
GFR, Estimated: 57 mL/min — ABNORMAL LOW (ref >60)
Glucose, Bld: 120 mg/dL — ABNORMAL HIGH (ref 70–99)
Potassium: 3.7 mmol/L (ref 3.5–5.1)
Sodium: 137 mmol/L (ref 135–145)
Total Bilirubin: 0.8 mg/dL (ref 0.3–1.2)
Total Protein: 6.3 g/dL — ABNORMAL LOW (ref 6.5–8.1)

## 2022-09-19 LAB — CBC WITH DIFFERENTIAL/PLATELET
Abs Immature Granulocytes: 0.05 10*3/uL (ref 0.00–0.07)
Basophils Absolute: 0 10*3/uL (ref 0.0–0.1)
Basophils Relative: 0 %
Eosinophils Absolute: 0.2 10*3/uL (ref 0.0–0.5)
Eosinophils Relative: 1 %
HCT: 42.7 % (ref 39.0–52.0)
Hemoglobin: 14.6 g/dL (ref 13.0–17.0)
Immature Granulocytes: 0 %
Lymphocytes Relative: 3 %
Lymphs Abs: 0.4 10*3/uL — ABNORMAL LOW (ref 0.7–4.0)
MCH: 32.5 pg (ref 26.0–34.0)
MCHC: 34.2 g/dL (ref 30.0–36.0)
MCV: 95.1 fL (ref 80.0–100.0)
Monocytes Absolute: 0.6 10*3/uL (ref 0.1–1.0)
Monocytes Relative: 5 %
Neutro Abs: 11.4 10*3/uL — ABNORMAL HIGH (ref 1.7–7.7)
Neutrophils Relative %: 91 %
Platelets: 200 10*3/uL (ref 150–400)
RBC: 4.49 MIL/uL (ref 4.22–5.81)
RDW: 14.4 % (ref 11.5–15.5)
WBC: 12.6 10*3/uL — ABNORMAL HIGH (ref 4.0–10.5)
nRBC: 0 % (ref 0.0–0.2)

## 2022-09-19 LAB — URINALYSIS, ROUTINE W REFLEX MICROSCOPIC
Bacteria, UA: NONE SEEN
Bilirubin Urine: NEGATIVE
Glucose, UA: NEGATIVE mg/dL
Ketones, ur: NEGATIVE mg/dL
Leukocytes,Ua: NEGATIVE
Nitrite: NEGATIVE
Protein, ur: NEGATIVE mg/dL
Specific Gravity, Urine: 1.005 (ref 1.005–1.030)
Squamous Epithelial / HPF: NONE SEEN /HPF (ref 0–5)
WBC, UA: NONE SEEN WBC/hpf (ref 0–5)
pH: 7 (ref 5.0–8.0)

## 2022-09-19 LAB — LACTIC ACID, PLASMA
Lactic Acid, Venous: 2.3 mmol/L (ref 0.5–1.9)
Lactic Acid, Venous: 1.2 mmol/L (ref 0.5–1.9)

## 2022-09-19 LAB — PROTIME-INR
INR: 1.1 (ref 0.8–1.2)
Prothrombin Time: 14.2 seconds (ref 11.4–15.2)

## 2022-09-19 LAB — URINE DRUG SCREEN, QUALITATIVE (ARMC ONLY)
Amphetamines, Ur Screen: NOT DETECTED
Barbiturates, Ur Screen: NOT DETECTED
Benzodiazepine, Ur Scrn: NOT DETECTED
Cannabinoid 50 Ng, Ur ~~LOC~~: NOT DETECTED
Cocaine Metabolite,Ur ~~LOC~~: NOT DETECTED
MDMA (Ecstasy)Ur Screen: NOT DETECTED
Methadone Scn, Ur: NOT DETECTED
Opiate, Ur Screen: NOT DETECTED
Phencyclidine (PCP) Ur S: NOT DETECTED
Tricyclic, Ur Screen: NOT DETECTED

## 2022-09-19 LAB — SARS CORONAVIRUS 2 BY RT PCR: SARS Coronavirus 2 by RT PCR: NEGATIVE

## 2022-09-19 LAB — PROCALCITONIN: Procalcitonin: 0.2 ng/mL

## 2022-09-19 MED ORDER — SODIUM CHLORIDE 0.9 % IV BOLUS
1000.0000 mL | Freq: Once | INTRAVENOUS | Status: AC
  Administered 2022-09-19: 1000 mL via INTRAVENOUS

## 2022-09-19 MED ORDER — SODIUM CHLORIDE 0.9 % IV SOLN
1.0000 g | Freq: Once | INTRAVENOUS | Status: AC
  Administered 2022-09-19: 1 g via INTRAVENOUS
  Filled 2022-09-19: qty 10

## 2022-09-19 MED ORDER — ACETAMINOPHEN 325 MG PO TABS
650.0000 mg | ORAL_TABLET | Freq: Once | ORAL | Status: AC
  Administered 2022-09-19: 650 mg via ORAL
  Filled 2022-09-19: qty 2

## 2022-09-19 MED ORDER — DOXYCYCLINE HYCLATE 100 MG PO TABS
100.0000 mg | ORAL_TABLET | Freq: Two times a day (BID) | ORAL | 0 refills | Status: AC
  Filled 2022-09-19: qty 14, 7d supply, fill #0

## 2022-09-19 NOTE — ED Provider Notes (Signed)
[facility] Provider Note    Event Date/Time   First MD Initiated Contact with Patient 09/19/22 [phone]     (approximate)   History   Tachycardia   HPI  James Kush. is a 52 y.o. male history of myeloma status post him cell transplant presents to the ER for evaluation of generalized malaise cough congestion.  Has been feeling unwell for the past 24 hours.  Denies any abdominal pain.  Just complaining of cough shortness of breath and congestion.  No known sick contacts.  Not currently on any antibiotics.     Physical Exam   Triage Vital Signs: ED Triage Vitals  Enc Vitals Group     BP 09/19/22 0829 (!) 138/90     Pulse Rate 09/19/22 0829 (!) 145     Resp 09/19/22 0829 (!) 22     Temp 09/19/22 0829 (!) 100.9 F (38.3 C)     Temp src --      SpO2 09/19/22 0829 97 %     Weight 09/19/22 0830 170 lb (77.1 kg)     Height 09/19/22 0830 5\' 7"  (1.702 m)     Head Circumference --      Peak Flow --      Pain Score 09/19/22 0829 6     Pain Loc --      Pain Edu? --      Excl. in GC? --     Most recent vital signs: Vitals:   09/19/22 1300 09/19/22 1400  BP: 116/80 124/85  Pulse: 90 75  Resp: 14 16  Temp:    SpO2: 99% 99%     Constitutional: Alert  Eyes: Conjunctivae are normal.  Head: Atraumatic. Nose: + congestion/rhinnorhea. Mouth/Throat: Mucous membranes are moist.   Neck: Painless ROM.  Cardiovascular:   Good peripheral circulation.  Mildly tachycardic but overall well-perfused. Respiratory: Normal respiratory effort.  No retractions.  Gastrointestinal: Soft and nontender.  Musculoskeletal:  no deformity Neurologic:  MAE spontaneously. No gross focal neurologic deficits are appreciated.  Skin:  Skin is warm, dry and intact. No rash noted. Psychiatric: Mood and affect are normal. Speech and behavior are normal.    ED Results / Procedures / Treatments   Labs (all labs ordered are listed, but only abnormal results are  displayed) Labs Reviewed  COMPREHENSIVE METABOLIC PANEL - Abnormal; Notable for the following components:      Result Value   CO2 21 (*)    Glucose, Bld 120 (*)    Creatinine, Ser 1.48 (*)    Calcium 8.7 (*)    Total Protein 6.3 (*)    GFR, Estimated 57 (*)    All other components within normal limits  LACTIC ACID, PLASMA - Abnormal; Notable for the following components:   Lactic Acid, Venous 2.3 (*)    All other components within normal limits  CBC WITH DIFFERENTIAL/PLATELET - Abnormal; Notable for the following components:   WBC 12.6 (*)    Neutro Abs 11.4 (*)    Lymphs Abs 0.4 (*)    All other components within normal limits  URINALYSIS, ROUTINE W REFLEX MICROSCOPIC - Abnormal; Notable for the following components:   Color, Urine STRAW (*)    APPearance CLEAR (*)    Hgb urine dipstick SMALL (*)    All other components within normal limits  SARS CORONAVIRUS 2 BY RT PCR  CULTURE, BLOOD (ROUTINE X 2)  CULTURE, BLOOD (ROUTINE X 2)  LACTIC ACID, PLASMA  PROTIME-INR  PROCALCITONIN  URINE DRUG SCREEN, QUALITATIVE ([facility] ONLY)     EKG  ED ECG REPORT I, Willy Eddy, the attending physician, personally viewed and interpreted this ECG.   Date: 09/19/2022  EKG Time: 8:43  Rate: 130  Rhythm: sinus  Axis: normal  Intervals: borderline prolonged qt  ST&T Change: no stemi, non specific st abn    RADIOLOGY Please see ED Course for my review and interpretation.  I personally reviewed all radiographic images ordered to evaluate for the above acute complaints and reviewed radiology reports and findings.  These findings were personally discussed with the patient.  Please see medical record for radiology report.    PROCEDURES:  Critical Care performed: No  Procedures   MEDICATIONS ORDERED IN ED: Medications  acetaminophen (TYLENOL) tablet 650 mg (650 mg Oral Given 09/19/22 1033)  sodium chloride 0.9 % bolus 1,000 mL (0 mLs Intravenous Stopped 09/19/22 1230)   cefTRIAXone (ROCEPHIN) 1 g in sodium chloride 0.9 % 100 mL IVPB (0 g Intravenous Stopped 09/19/22 1433)  sodium chloride 0.9 % bolus 1,000 mL (0 mLs Intravenous Stopped 09/19/22 1433)     IMPRESSION / MDM / ASSESSMENT AND PLAN / ED COURSE  I reviewed the triage vital signs and the nursing notes.                              Differential diagnosis includes, but is not limited to, sepsis, dehydration, electrolyte abnormality, URI, pneumonia, UTI, bacteremia  Patient presenting to the ER for evaluation of symptoms as described above.  Based on symptoms, risk factors and considered above differential, this presenting complaint could reflect a potentially life-threatening illness therefore the patient will be placed on continuous pulse oximetry and telemetry for monitoring.  Laboratory evaluation will be sent to evaluate for the above complaints.  Patient tachycardic and febrile but nontoxic-appearing blood work sent for the blood differential.  Will order chest x-ray.  Abdominal exam is soft benign.  He does not have any signs or symptoms to suggest meningitis or encephalitis.  Neuroexam is nonfocal.  Denies any low back pain numbness or tingling.    Clinical Course as of 09/19/22 1433  Thu Sep 19, 2022  1610 Patient's lactate is mildly elevated does have leukocytosis.  Chest x-ray without evidence of consolidation. [PR]  1430 Patient stating he feels significantly improved at this point.  While he does have history of multiple myeloma he is not neutropenic his SIRS criteria have resolved his lactate is normal and he has symptoms of sinusitis.  Have low suspicion for bacteremia intra-abdominal source or others source of infection 1.  His COVID is negative.  His urinalysis is without any evidence of infection.  We discussed option for admission to the hospital for IV fluids antibiotics and further observation.  Patient states he feels well at this point and would like to be discharged.  That does seem  to be reasonable given his reassuring workup. [PR]    Clinical Course User Index [PR] Willy Eddy, MD     FINAL CLINICAL IMPRESSION(S) / ED DIAGNOSES   Final diagnoses:  Fever, unspecified fever cause  Head congestion     Rx / DC Orders   ED Discharge Orders          Ordered    doxycycline (VIBRA-TABS) 100 MG tablet  2 times daily        09/19/22 1426             Note:  This document was prepared using Dragon voice recognition software and may include unintentional dictation errors.    Willy Eddy, MD 09/19/22 [phone]

## 2022-09-19 NOTE — ED Triage Notes (Signed)
Pt to ED for chills, congestion, tachycardia. Pt states was initially here to check in for MRI. Has cancer. States has not been feeling well for a couple days.   Daughter reports pt is acting slower to respond and confused because "has not slept off night time meds"

## 2022-09-20 LAB — CULTURE, BLOOD (ROUTINE X 2): Culture: NO GROWTH

## 2022-09-21 LAB — CULTURE, BLOOD (ROUTINE X 2): Culture: NO GROWTH

## 2022-09-22 LAB — CULTURE, BLOOD (ROUTINE X 2): Special Requests: ADEQUATE

## 2022-09-23 LAB — CULTURE, BLOOD (ROUTINE X 2)

## 2022-09-23 MED FILL — Dexamethasone Sodium Phosphate Inj 100 MG/10ML: INTRAMUSCULAR | Qty: 2 | Status: AC

## 2022-09-24 ENCOUNTER — Inpatient Hospital Stay: Payer: BC Managed Care – PPO

## 2022-09-24 ENCOUNTER — Other Ambulatory Visit: Payer: Self-pay

## 2022-09-24 ENCOUNTER — Inpatient Hospital Stay: Payer: BC Managed Care – PPO | Attending: Internal Medicine

## 2022-09-24 ENCOUNTER — Encounter: Payer: Self-pay | Admitting: Oncology

## 2022-09-24 ENCOUNTER — Other Ambulatory Visit: Payer: Self-pay | Admitting: *Deleted

## 2022-09-24 ENCOUNTER — Inpatient Hospital Stay (HOSPITAL_BASED_OUTPATIENT_CLINIC_OR_DEPARTMENT_OTHER): Payer: BC Managed Care – PPO | Admitting: Oncology

## 2022-09-24 VITALS — BP 123/81 | HR 109 | Temp 100.2°F | Ht 67.0 in | Wt 152.3 lb

## 2022-09-24 VITALS — BP 116/70 | HR 104 | Temp 101.1°F | Resp 16

## 2022-09-24 DIAGNOSIS — Z95828 Presence of other vascular implants and grafts: Secondary | ICD-10-CM

## 2022-09-24 DIAGNOSIS — Z5112 Encounter for antineoplastic immunotherapy: Secondary | ICD-10-CM | POA: Insufficient documentation

## 2022-09-24 DIAGNOSIS — E876 Hypokalemia: Secondary | ICD-10-CM

## 2022-09-24 DIAGNOSIS — C9002 Multiple myeloma in relapse: Secondary | ICD-10-CM

## 2022-09-24 DIAGNOSIS — Z79899 Other long term (current) drug therapy: Secondary | ICD-10-CM | POA: Diagnosis not present

## 2022-09-24 LAB — CBC WITH DIFFERENTIAL (CANCER CENTER ONLY)
Abs Immature Granulocytes: 0.03 10*3/uL (ref 0.00–0.07)
Basophils Absolute: 0 10*3/uL (ref 0.0–0.1)
Basophils Relative: 0 %
Eosinophils Absolute: 0 10*3/uL (ref 0.0–0.5)
Eosinophils Relative: 0 %
HCT: 38.3 % — ABNORMAL LOW (ref 39.0–52.0)
Hemoglobin: 13.5 g/dL (ref 13.0–17.0)
Immature Granulocytes: 0 %
Lymphocytes Relative: 6 %
Lymphs Abs: 0.4 10*3/uL — ABNORMAL LOW (ref 0.7–4.0)
MCH: 31.9 pg (ref 26.0–34.0)
MCHC: 35.2 g/dL (ref 30.0–36.0)
MCV: 90.5 fL (ref 80.0–100.0)
Monocytes Absolute: 0.4 10*3/uL (ref 0.1–1.0)
Monocytes Relative: 5 %
Neutro Abs: 6.2 10*3/uL (ref 1.7–7.7)
Neutrophils Relative %: 89 %
Platelet Count: 175 10*3/uL (ref 150–400)
RBC: 4.23 MIL/uL (ref 4.22–5.81)
RDW: 13.9 % (ref 11.5–15.5)
WBC Count: 7 10*3/uL (ref 4.0–10.5)
nRBC: 0 % (ref 0.0–0.2)

## 2022-09-24 LAB — CMP (CANCER CENTER ONLY)
ALT: 22 U/L (ref 0–44)
AST: 30 U/L (ref 15–41)
Albumin: 3.5 g/dL (ref 3.5–5.0)
Alkaline Phosphatase: 74 U/L (ref 38–126)
Anion gap: 11 (ref 5–15)
BUN: 18 mg/dL (ref 6–20)
CO2: 20 mmol/L — ABNORMAL LOW (ref 22–32)
Calcium: 7.9 mg/dL — ABNORMAL LOW (ref 8.9–10.3)
Chloride: 100 mmol/L (ref 98–111)
Creatinine: 1.42 mg/dL — ABNORMAL HIGH (ref 0.61–1.24)
GFR, Estimated: 60 mL/min — ABNORMAL LOW (ref >60)
Glucose, Bld: 103 mg/dL — ABNORMAL HIGH (ref 70–99)
Potassium: 3.1 mmol/L — ABNORMAL LOW (ref 3.5–5.1)
Sodium: 131 mmol/L — ABNORMAL LOW (ref 135–145)
Total Bilirubin: 1.1 mg/dL (ref 0.3–1.2)
Total Protein: 6.7 g/dL (ref 6.5–8.1)

## 2022-09-24 LAB — CULTURE, BLOOD (ROUTINE X 2): Special Requests: ADEQUATE

## 2022-09-24 MED ORDER — HEPARIN SOD (PORK) LOCK FLUSH 100 UNIT/ML IV SOLN
500.0000 [IU] | Freq: Once | INTRAVENOUS | Status: AC
  Administered 2022-09-24: 500 [IU] via INTRAVENOUS
  Filled 2022-09-24: qty 5

## 2022-09-24 MED ORDER — AMOXICILLIN-POT CLAVULANATE 875-125 MG PO TABS
1.0000 | ORAL_TABLET | Freq: Two times a day (BID) | ORAL | 0 refills | Status: DC
  Filled 2022-09-24: qty 14, 7d supply, fill #0

## 2022-09-24 MED ORDER — POTASSIUM CHLORIDE IN NACL 20-0.9 MEQ/L-% IV SOLN
Freq: Once | INTRAVENOUS | Status: AC
  Filled 2022-09-24: qty 1000

## 2022-09-24 NOTE — Progress Notes (Signed)
Hematology/Oncology Consult note [facility]  Telephone:([phone] [phone]  Patient Care Team: Doreene Nest, NP as PCP - General (Internal Medicine) Martin Majestic, MD as PCP - Hematology/Oncology Creig Hines, MD as Consulting Physician (Hematology and Oncology)   Name of the patient: Alba Spangler  [id number]  03-02-1971   Date of visit: 09/24/22  Diagnosis- kappa light chain multiple myeloma in relapse    Chief complaint/ Reason for visit-on treatment assessment prior to cycle 4-day 1 of carfilzomib  Heme/Onc history: Patient is a 52- year-old male with a history of kappa light chain myeloma that was treated by [name].  History is as follows:   1.  Patient presented with renal insufficiency with a creatinine of 2.4 in April 2018.  Kidney biopsy showed myeloma cast nephropathy.  Kappa light chain was 3004 and lambda 0.22 with a kappa lambda ratio of 13,000 655.  Skeletal survey showed multiple calvarial and marrow lesions.  Bone marrow biopsy in May 2018 showed 43% plasma cells.  He received CyBorD for cycle 1 in May 2018 and was subsequently switched to RVD.  He received 4 cycles followed by a repeat bone marrow biopsy in August 2018 which showed no increase in plasma cells.   2.  He underwent autologous stem cell transplantation on 01/30/2017.  He was started on Revlimid maintenance 10 mg daily in January 2019.   3.  Labs from January February and March 2019 done at [facility] showed no M spike, normal kappa lambda ratio of 1.06.  He was last seen by them in April 2019.   4. Following that patient was admitted to [facility] for healthcare associated pneumonia and was recently discharged.  He wished to transfer his care to [place] at this time.  He was on maintenance Revlimid 5 mg which was then reduced to 2.5 mg.  Follows up with pain clinic for chemo-induced peripheral neuropathy with [name].  His urine tested positive for [facility] and therefore  patient was discharged from pain clinic   5.  Noted to have gradually increasing free kappa light chainFrom 15 in 20 21-39.5 with a ratio of 3.09.  He was seen by [facility] again and underwent a repeat bone marrow biopsy which did not show any evidence of clonal plasma cells.  PET CT scan also did not show any new active lytic lesions in May 2023.Marland Kitchen  Revlimid dose was increased to 15 mg 3 weeks on 1 week off but kappa light chains show continuing to increase   6.  PET CT scan in December 2023 did not show any evidence of active myeloma.  Patient underwent repeat bone marrow biopsy at [facility] which showed 6% plasma cellsConsistent with persistent plasma cell neoplasm.  74 to 86% of isolated plasma cells show loss of T p53 and monosomy 13.  Patient was seen by [name] at [facility] and was recommended Darzalex Pomalyst dexamethasone regimen   7.  Disease progression after 8 weekly cycles of Darazalex kappa light chain increased from 332-646 with a ratio that went up from 39-57 concerning for disease progression.  Patient switched to carfilzomib Pomalyst dexamethasone regimen  Interval history-patient was in the ER about a week ago for progressive weakness of and mental status changes.  Infectious workup including chest x-ray and urinalysis was negative.  He was given a dose of antibiotics in the ER and discharged.  Today patient states that he continues to feel quite fatigued.  He has productive cough with  yellowish sputum production.  He had a temperature of 100 at home.  ECOG PS- 2 Pain scale- 4 Opioid associated constipation- no  Review of systems- Review of Systems  Constitutional:  Positive for malaise/fatigue. Negative for chills, fever and weight loss.  HENT:  Negative for congestion, ear discharge and nosebleeds.   Eyes:  Negative for blurred vision.  Respiratory:  Positive for cough. Negative for hemoptysis, sputum production, shortness of breath and wheezing.   Cardiovascular:  Negative for chest pain,  palpitations, orthopnea and claudication.  Gastrointestinal:  Negative for abdominal pain, blood in stool, constipation, diarrhea, heartburn, melena, nausea and vomiting.  Genitourinary:  Negative for dysuria, flank pain, frequency, hematuria and urgency.  Musculoskeletal:  Negative for back pain, joint pain and myalgias.  Skin:  Negative for rash.  Neurological:  Negative for dizziness, tingling, focal weakness, seizures, weakness and headaches.  Endo/Heme/Allergies:  Does not bruise/bleed easily.  Psychiatric/Behavioral:  Negative for depression and suicidal ideas. The patient does not have insomnia.       No Known Allergies   Past Medical History:  Diagnosis Date   CKD (chronic kidney disease) stage 3, GFR 30-59 ml/min (HCC)    Hypertension    Multiple myeloma (HCC) 08/11/2017   Neuropathy    Pneumonia    Severe sepsis (HCC) 08/11/2017   Shingles 08/03/2019     Past Surgical History:  Procedure Laterality Date   ABDOMINAL SURGERY     COLONOSCOPY WITH PROPOFOL N/A 05/25/2021   Procedure: COLONOSCOPY WITH PROPOFOL;  Surgeon: Toney Reil, MD;  Location: [facility] ENDOSCOPY;  Service: Gastroenterology;  Laterality: N/A;   IR IMAGING GUIDED PORT INSERTION  08/02/2022   JOINT REPLACEMENT     right knee   KNEE SURGERY Left     Social History   Socioeconomic History   Marital status: Single    Spouse name: [name]   Number of children: 3   Years of education: Not on file   Highest education level: Not on file  Occupational History   Not on file  Tobacco Use   Smoking status: Former   Smokeless tobacco: Current    Types: Snuff  Vaping Use   Vaping Use: Never used  Substance and Sexual Activity   Alcohol use: No   Drug use: No   Sexual activity: Yes  Other Topics Concern   Not on file  Social History Narrative   Live in private residence with spouse and mother in law   Social Determinants of Health   Financial Resource Strain: Low Risk  (08/12/2017)   Overall  Financial Resource Strain (CARDIA)    Difficulty of Paying Living Expenses: Not hard at all  Food Insecurity: No Food Insecurity (08/12/2017)   Hunger Vital Sign    Worried About Running Out of Food in the Last Year: Never true    Ran Out of Food in the Last Year: Never true  Transportation Needs: No Transportation Needs (08/12/2017)   PRAPARE - Administrator, Civil Service (Medical): No    Lack of Transportation (Non-Medical): No  Physical Activity: Sufficiently Active (08/12/2017)   Exercise Vital Sign    Days of Exercise per Week: 7 days    Minutes of Exercise per Session: 30 min  Stress: No Stress Concern Present (08/12/2017)   [employer] of Occupational Health - Occupational Stress Questionnaire    Feeling of Stress : Only a little  Social Connections: Somewhat Isolated (08/12/2017)   Social Connection and Isolation Panel [NHANES]  Frequency of Communication with Friends and Family: Once a week    Frequency of Social Gatherings with Friends and Family: Once a week    Attends Religious Services: More than 4 times per year    Active Member of [employer] or Organizations: No    Attends Banker Meetings: Never    Marital Status: Married  Catering manager Violence: Not At Risk (08/12/2017)   Humiliation, Afraid, Rape, and Kick questionnaire    Fear of Current or Ex-Partner: No    Emotionally Abused: No    Physically Abused: No    Sexually Abused: No    Family History  Problem Relation Age of Onset   Cancer Mother 46       Pancreatic   COPD Mother    Diabetes Mother    Hyperlipidemia Mother    Hypertension Mother    Cancer Maternal Uncle        pancreatic   Cancer Maternal Grandmother 39       colon   COPD Maternal Grandmother    Diabetes Maternal Grandmother    Hypertension Maternal Grandmother      Current Outpatient Medications:    acyclovir (ZOVIRAX) 400 MG tablet, Take 1 tablet (400 mg total) by mouth 2 (two) times daily., Disp: 60  tablet, Rfl: 2   amitriptyline (ELAVIL) 10 MG tablet, Take 1 tablet (10 mg total) by mouth at bedtime., Disp: 30 tablet, Rfl: 1   aspirin EC 81 MG tablet, Take by mouth., Disp: , Rfl:    baclofen (LIORESAL) 10 MG tablet, Take 1 tablet (10 mg total) by mouth 4 (four) times daily as needed for muscle spasms., Disp: 360 tablet, Rfl: 0   celecoxib (CELEBREX) 200 MG capsule, Take 1 capsule (200 mg total) by mouth daily., Disp: 30 capsule, Rfl: 2   cyanocobalamin (VITAMIN B12) 1000 MCG/ML injection, Inject 1 mL (1,000 mcg total) into the muscle every 30 (thirty) days., Disp: 1 mL, Rfl: 11   dexamethasone (DECADRON) 4 MG tablet, Take 5 tablets (20 mg total) by mouth once a week. Take first dose 1 hour prior to reporting to clinic for daratumumab. Take 2nd dose the day after daratumumab., Disp: 20 tablet, Rfl: 2   doxycycline (VIBRA-TABS) 100 MG tablet, Take 1 tablet (100 mg total) by mouth 2 (two) times daily for 7 days., Disp: 14 tablet, Rfl: 0   DULoxetine (CYMBALTA) 60 MG capsule, Take 1 capsule (60 mg total) by mouth daily., Disp: 60 capsule, Rfl: 1   levothyroxine (SYNTHROID) 50 MCG tablet, Take 1 tablet (50 mcg total) by mouth daily before breakfast., Disp: 30 tablet, Rfl: 2   lidocaine-prilocaine (EMLA) cream, Apply 1 Application topically as needed., Disp: 30 g, Rfl: 0   montelukast (SINGULAIR) 10 MG tablet, Take 1 tablet in the evening 1 day before your treatment. Day of treatment take 1 tablet in the morning for the first 2 treatments of Daratumumab, Disp: 4 tablet, Rfl: 0   OLANZapine (ZYPREXA) 10 MG tablet, Take 1 tablet (10 mg total) by mouth at bedtime. To help with nausea prevention., Disp: 30 tablet, Rfl: 1   OLANZapine (ZYPREXA) 10 MG tablet, Take by mouth., Disp: , Rfl:    oxyCODONE ER (XTAMPZA ER) 9 MG C12A, Take 9 mg by mouth every 12 (twelve) hours., Disp: 60 capsule, Rfl: 0   Oxycodone HCl 10 MG TABS, Take 1 tablet (10 mg total) by mouth every 8 (eight) hours as needed., Disp: 90  tablet, Rfl: 0   pomalidomide (POMALYST) 2  MG capsule, Take 1 capsule (2 mg total) by mouth daily. Take 1 tablet daily for 21 days and 7 days off , Rems [id number] obtained 5/6, Disp: 21 capsule, Rfl: 0   potassium chloride SA (KLOR-CON M) 20 MEQ tablet, Take 2 tablets (40 mEq total) by mouth 2 (two) times daily., Disp: 120 tablet, Rfl: 0   pregabalin (LYRICA) 300 MG capsule, Take 1 capsule (300 mg total) by mouth 2 (two) times daily., Disp: 60 capsule, Rfl: 3   prochlorperazine (COMPAZINE) 10 MG tablet, Take by mouth., Disp: , Rfl:    amoxicillin-clavulanate (AUGMENTIN) 875-125 MG tablet, Take 1 tablet by mouth 2 (two) times daily., Disp: 14 tablet, Rfl: 0  Physical exam:  Vitals:   09/24/22 0855  BP: 123/81  Pulse: (!) 109  Temp: 100.2 F (37.9 C)  TempSrc: Tympanic  Weight: 152 lb 4.8 oz (69.1 kg)  Height: 5\' 7"  (1.702 m)   Physical Exam Cardiovascular:     Rate and Rhythm: Normal rate and regular rhythm.     Heart sounds: Normal heart sounds.  Pulmonary:     Effort: Pulmonary effort is normal.     Comments: Bibasilar crackles Abdominal:     General: Bowel sounds are normal.     Palpations: Abdomen is soft.  Skin:    General: Skin is warm and dry.  Neurological:     Mental Status: He is alert and oriented to person, place, and time.         Latest Ref Rng & Units 09/24/2022    8:36 AM  CMP  Glucose 70 - 99 mg/dL 540   BUN 6 - 20 mg/dL 18   Creatinine 9.81 - 1.24 mg/dL 1.91   Sodium 478 - 295 mmol/L 131   Potassium 3.5 - 5.1 mmol/L 3.1   Chloride 98 - 111 mmol/L 100   CO2 22 - 32 mmol/L 20   Calcium 8.9 - 10.3 mg/dL 7.9   Total Protein 6.5 - 8.1 g/dL 6.7   Total Bilirubin 0.3 - 1.2 mg/dL 1.1   Alkaline Phos 38 - 126 U/L 74   AST 15 - 41 U/L 30   ALT 0 - 44 U/L 22       Latest Ref Rng & Units 09/24/2022    8:36 AM  CBC  WBC 4.0 - 10.5 K/uL 7.0   Hemoglobin 13.0 - 17.0 g/dL 62.1   Hematocrit 30.8 - 52.0 % 38.3   Platelets 150 - 400 K/uL 175     No images are  attached to the encounter.  DG Chest 2 View  Result Date: 09/19/2022 CLINICAL DATA:  Suspected sepsis, tachycardia EXAM: CHEST - 2 VIEW COMPARISON:  Chest x-ray August 11, 2017 FINDINGS: Right chest wall port catheter in place, terminating in the superior cavoatrial junction. The cardiomediastinal silhouette is unchanged in contour. No focal pulmonary opacity. No pleural effusion or pneumothorax. The visualized upper abdomen is unremarkable. No acute osseous abnormality. IMPRESSION: No active cardiopulmonary disease. Electronically Signed   By: Jacob Moores M.D.   On: 09/19/2022 09:10     Assessment and plan- Patient is a 52 y.o. male with history of kappa light chain multiple myeloma currently in relapse.  He is here for on treatment assessment prior to cycle 3-day 1 of carfilzomib  Patient is feeling poorly today and has ongoing cough with expectoration.  I am holding off on this treatment today and giving him a 7-day prescription for Augmentin.  Will also give him 1 L of IV  fluids today with 20 mEq of IV potassium.  He has echocardiogram scheduled for 10/04/2022.  Moving forward I will reduce the dose of carfilzomib from 27 mg/m to 20 mg/m.  Patient also hold off on taking Pomalyst this week and 3 started next week.  He is already on a lower dose of Pomalyst 2 mg 2 weeks on 1 week of  Continue with aspirin and acyclovir prophylaxis   Visit Diagnosis 1. Multiple myeloma in relapse [facility])      Dr. Owens Shark, MD, MPH [facility] [phone] 09/24/2022 12:37 PM

## 2022-09-24 NOTE — Patient Instructions (Signed)
Hypokalemia Hypokalemia means that the amount of potassium in the blood is lower than normal. Potassium is a mineral (electrolyte) that helps regulate the amount of fluid in the body. It also stimulates muscle tightening (contraction) and helps nerves work properly. Normally, most of the body's potassium is inside cells, and only a very small amount is in the blood. Because the amount in the blood is so small, minor changes to potassium levels in the blood can be life-threatening. What are the causes? This condition may be caused by: Antibiotic medicine. Diarrhea or vomiting. Taking too much of a medicine that helps you have a bowel movement (laxative) can cause diarrhea and lead to hypokalemia. Chronic kidney disease (CKD). Medicines that help the body get rid of excess fluid (diuretics). Eating disorders, such as anorexia or bulimia. Low magnesium levels in the body. Sweating a lot. What are the signs or symptoms? Symptoms of this condition include: Weakness. Constipation. Fatigue. Muscle cramps. Mental confusion. Skipped heartbeats or irregular heartbeat (palpitations). Tingling or numbness. How is this diagnosed? This condition is diagnosed with a blood test. How is this treated? This condition may be treated by: Taking potassium supplements. Adjusting the medicines that you take. Eating more foods that contain a lot of potassium. If your potassium level is very low, you may need to get potassium through an IV and be monitored in the hospital. Follow these instructions at home: Eating and drinking  Eat a healthy diet. A healthy diet includes fresh fruits and vegetables, whole grains, healthy fats, and lean proteins. If told, eat more foods that contain a lot of potassium. These include: Nuts, such as peanuts and pistachios. Seeds, such as sunflower seeds and pumpkin seeds. Peas, lentils, and lima beans. Whole grain and bran cereals and breads. Fresh fruits and vegetables,  such as apricots, avocado, bananas, cantaloupe, kiwi, oranges, tomatoes, asparagus, and potatoes. Juices, such as orange, tomato, and prune. Lean meats, including fish. Milk and milk products, such as yogurt. General instructions Take over-the-counter and prescription medicines only as told by your health care provider. This includes vitamins, natural food products, and supplements. Keep all follow-up visits. This is important. Contact a health care provider if: You have weakness that gets worse. You feel your heart pounding or racing. You vomit. You have diarrhea. You have diabetes and you have trouble keeping your blood sugar in your target range. Get help right away if: You have chest pain. You have shortness of breath. You have vomiting or diarrhea that lasts for more than 2 days. You faint. These symptoms may be an emergency. Get help right away. Call 911. Do not wait to see if the symptoms will go away. Do not drive yourself to the hospital. Summary Hypokalemia means that the amount of potassium in the blood is lower than normal. This condition is diagnosed with a blood test. Hypokalemia may be treated by taking potassium supplements, adjusting the medicines that you take, or eating more foods that are high in potassium. If your potassium level is very low, you may need to get potassium through an IV and be monitored in the hospital. This information is not intended to replace advice given to you by your health care provider. Make sure you discuss any questions you have with your health care provider. Document Revised: 12/21/2020 Document Reviewed: 12/21/2020 Elsevier Patient Education  2024 Elsevier Inc.  

## 2022-09-25 ENCOUNTER — Telehealth: Payer: Self-pay | Admitting: *Deleted

## 2022-09-25 ENCOUNTER — Other Ambulatory Visit: Payer: Self-pay | Admitting: *Deleted

## 2022-09-25 NOTE — Telephone Encounter (Signed)
He states that he needs letter for work. I asked why because we already did a [employer] form that says he will be on continuous out of work for 6 months. He gave me a fax number [phone]. I made note and faxed it to his work. Transmission went through with fax.

## 2022-09-30 ENCOUNTER — Other Ambulatory Visit: Payer: Self-pay | Admitting: Oncology

## 2022-09-30 DIAGNOSIS — C9000 Multiple myeloma not having achieved remission: Secondary | ICD-10-CM

## 2022-09-30 MED FILL — Dexamethasone Sodium Phosphate Inj 100 MG/10ML: INTRAMUSCULAR | Qty: 2 | Status: AC

## 2022-10-01 ENCOUNTER — Inpatient Hospital Stay: Payer: BC Managed Care – PPO

## 2022-10-01 ENCOUNTER — Encounter: Payer: Self-pay | Admitting: Oncology

## 2022-10-01 ENCOUNTER — Other Ambulatory Visit: Payer: Self-pay | Admitting: *Deleted

## 2022-10-01 ENCOUNTER — Other Ambulatory Visit: Payer: Self-pay

## 2022-10-01 ENCOUNTER — Other Ambulatory Visit: Payer: BC Managed Care – PPO

## 2022-10-01 ENCOUNTER — Ambulatory Visit: Payer: BC Managed Care – PPO

## 2022-10-01 ENCOUNTER — Inpatient Hospital Stay (HOSPITAL_BASED_OUTPATIENT_CLINIC_OR_DEPARTMENT_OTHER): Payer: BC Managed Care – PPO | Admitting: Oncology

## 2022-10-01 VITALS — HR 94

## 2022-10-01 VITALS — BP 116/80 | HR 115 | Temp 98.8°F | Wt 145.0 lb

## 2022-10-01 DIAGNOSIS — Z79899 Other long term (current) drug therapy: Secondary | ICD-10-CM

## 2022-10-01 DIAGNOSIS — T451X5A Adverse effect of antineoplastic and immunosuppressive drugs, initial encounter: Secondary | ICD-10-CM

## 2022-10-01 DIAGNOSIS — C9002 Multiple myeloma in relapse: Secondary | ICD-10-CM

## 2022-10-01 DIAGNOSIS — Z5111 Encounter for antineoplastic chemotherapy: Secondary | ICD-10-CM

## 2022-10-01 DIAGNOSIS — E876 Hypokalemia: Secondary | ICD-10-CM

## 2022-10-01 DIAGNOSIS — Z5112 Encounter for antineoplastic immunotherapy: Secondary | ICD-10-CM | POA: Diagnosis not present

## 2022-10-01 DIAGNOSIS — G620 Drug-induced polyneuropathy: Secondary | ICD-10-CM | POA: Diagnosis not present

## 2022-10-01 LAB — CBC WITH DIFFERENTIAL (CANCER CENTER ONLY)
Abs Immature Granulocytes: 0.15 10*3/uL — ABNORMAL HIGH (ref 0.00–0.07)
Basophils Absolute: 0 10*3/uL (ref 0.0–0.1)
Basophils Relative: 1 %
Eosinophils Absolute: 0.1 10*3/uL (ref 0.0–0.5)
Eosinophils Relative: 1 %
HCT: 37.4 % — ABNORMAL LOW (ref 39.0–52.0)
Hemoglobin: 12.9 g/dL — ABNORMAL LOW (ref 13.0–17.0)
Immature Granulocytes: 2 %
Lymphocytes Relative: 11 %
Lymphs Abs: 0.8 10*3/uL (ref 0.7–4.0)
MCH: 31.5 pg (ref 26.0–34.0)
MCHC: 34.5 g/dL (ref 30.0–36.0)
MCV: 91.4 fL (ref 80.0–100.0)
Monocytes Absolute: 0.9 10*3/uL (ref 0.1–1.0)
Monocytes Relative: 11 %
Neutro Abs: 5.9 10*3/uL (ref 1.7–7.7)
Neutrophils Relative %: 74 %
Platelet Count: 485 10*3/uL — ABNORMAL HIGH (ref 150–400)
RBC: 4.09 MIL/uL — ABNORMAL LOW (ref 4.22–5.81)
RDW: 13.8 % (ref 11.5–15.5)
WBC Count: 7.9 10*3/uL (ref 4.0–10.5)
nRBC: 0 % (ref 0.0–0.2)

## 2022-10-01 LAB — CMP (CANCER CENTER ONLY)
ALT: 35 U/L (ref 0–44)
AST: 34 U/L (ref 15–41)
Albumin: 3.2 g/dL — ABNORMAL LOW (ref 3.5–5.0)
Alkaline Phosphatase: 104 U/L (ref 38–126)
Anion gap: 11 (ref 5–15)
BUN: 20 mg/dL (ref 6–20)
CO2: 18 mmol/L — ABNORMAL LOW (ref 22–32)
Calcium: 8.7 mg/dL — ABNORMAL LOW (ref 8.9–10.3)
Chloride: 108 mmol/L (ref 98–111)
Creatinine: 1.27 mg/dL — ABNORMAL HIGH (ref 0.61–1.24)
GFR, Estimated: >60 mL/min (ref >60)
Glucose, Bld: 186 mg/dL — ABNORMAL HIGH (ref 70–99)
Potassium: 2.8 mmol/L — ABNORMAL LOW (ref 3.5–5.1)
Sodium: 137 mmol/L (ref 135–145)
Total Bilirubin: 0.9 mg/dL (ref 0.3–1.2)
Total Protein: 6.8 g/dL (ref 6.5–8.1)

## 2022-10-01 MED ORDER — POTASSIUM CHLORIDE 20 MEQ/100ML IV SOLN
20.0000 meq | Freq: Once | INTRAVENOUS | Status: AC
  Administered 2022-10-01: 20 meq via INTRAVENOUS

## 2022-10-01 MED ORDER — SODIUM CHLORIDE 0.9 % IV SOLN
Freq: Once | INTRAVENOUS | Status: AC
  Filled 2022-10-01: qty 250

## 2022-10-01 MED ORDER — AMOXICILLIN-POT CLAVULANATE 875-125 MG PO TABS
1.0000 | ORAL_TABLET | Freq: Two times a day (BID) | ORAL | 0 refills | Status: DC
  Filled 2022-10-01: qty 14, 7d supply, fill #0

## 2022-10-01 MED ORDER — SODIUM CHLORIDE 0.9 % IV SOLN
20.0000 mg | Freq: Once | INTRAVENOUS | Status: AC
  Administered 2022-10-01: 20 mg via INTRAVENOUS
  Filled 2022-10-01: qty 20
  Filled 2022-10-01: qty 2

## 2022-10-01 MED ORDER — DEXTROSE 5 % IV SOLN
20.0000 mg/m2 | Freq: Once | INTRAVENOUS | Status: AC
  Administered 2022-10-01: 36 mg via INTRAVENOUS
  Filled 2022-10-01: qty 15

## 2022-10-01 MED ORDER — ALBUTEROL SULFATE HFA 108 (90 BASE) MCG/ACT IN AERS
2.0000 | INHALATION_SPRAY | Freq: Four times a day (QID) | RESPIRATORY_TRACT | 2 refills | Status: DC | PRN
  Filled 2022-10-01: qty 6.7, 25d supply, fill #0
  Filled 2022-12-11: qty 6.7, 25d supply, fill #1

## 2022-10-01 MED ORDER — HEPARIN SOD (PORK) LOCK FLUSH 100 UNIT/ML IV SOLN
500.0000 [IU] | Freq: Once | INTRAVENOUS | Status: AC | PRN
  Administered 2022-10-01: 500 [IU]
  Filled 2022-10-01: qty 5

## 2022-10-01 MED ORDER — POMALIDOMIDE 2 MG PO CAPS: 2.0000 mg | ORAL_CAPSULE | Freq: Every day | ORAL | 0 refills | Status: DC

## 2022-10-01 NOTE — Progress Notes (Signed)
Hematology/Oncology Consult note [facility]  [phone] [phone]  Patient Care Team: Doreene Nest, NP as PCP - General (Internal Medicine) Martin Majestic, MD as PCP - Hematology/Oncology Creig Hines, MD as Consulting Physician (Hematology and Oncology)   Name of the patient: James House  [id number]  February 19, 1971   Date of visit: 10/01/22  Diagnosis- kappa light chain multiple myeloma in relapse   Chief complaint/ Reason for visit-on treatment assessment prior to cycle 3-day 1 of carfilzomib  Heme/Onc history: Patient is a 52- year-old male with a history of kappa light chain myeloma that was treated by [name].  History is as follows:   1.  Patient presented with renal insufficiency with a creatinine of 2.4 in April 2018.  Kidney biopsy showed myeloma cast nephropathy.  Kappa light chain was 3004 and lambda 0.22 with a kappa lambda ratio of 13,000 655.  Skeletal survey showed multiple calvarial and marrow lesions.  Bone marrow biopsy in May 2018 showed 43% plasma cells.  He received CyBorD for cycle 1 in May 2018 and was subsequently switched to RVD.  He received 4 cycles followed by a repeat bone marrow biopsy in August 2018 which showed no increase in plasma cells.   2.  He underwent autologous stem cell transplantation on 01/30/2017.  He was started on Revlimid maintenance 10 mg daily in January 2019.   3.  Labs from January February and March 2019 done at [facility] showed no M spike, normal kappa lambda ratio of 1.06.  He was last seen by them in April 2019.   4. Following that patient was admitted to [facility] for healthcare associated pneumonia and was recently discharged.  He wished to transfer his care to [place] at this time.  He was on maintenance Revlimid 5 mg which was then reduced to 2.5 mg.  Follows up with pain clinic for chemo-induced peripheral neuropathy with [name].  His urine tested positive for [name] and therefore  patient was discharged from pain clinic   5.  Noted to have gradually increasing free kappa light chainFrom 15 in 20 21-39.5 with a ratio of 3.09.  He was seen by [facility] again and underwent a repeat bone marrow biopsy which did not show any evidence of clonal plasma cells.  PET CT scan also did not show any new active lytic lesions in May 2023.Marland Kitchen  Revlimid dose was increased to 15 mg 3 weeks on 1 week off but kappa light chains show continuing to increase   6.  PET CT scan in December 2023 did not show any evidence of active myeloma.  Patient underwent repeat bone marrow biopsy at [facility] which showed 6% plasma cellsConsistent with persistent plasma cell neoplasm.  74 to 86% of isolated plasma cells show loss of T p53 and monosomy 13.  Patient was seen by [name] at [facility] and was recommended Darzalex Pomalyst dexamethasone regimen   7.  Disease progression after 8 weekly cycles of Darazalex kappa light chain increased from 332-646 with a ratio that went up from 39-57 concerning for disease progression.  Patient switched to carfilzomib Pomalyst dexamethasone regimen  Interval history-feels a little better after starting antibiotics but is still coughing mostly nonproductive.  No fevers.  Is the cough that is most bothersome.  He has lost 7 pounds in the last 1 week.  ECOG PS- 2 Pain scale- 3 Opioid associated constipation- no  Review of systems- Review of Systems  Constitutional:  Positive for  malaise/fatigue and weight loss. Negative for chills and fever.  HENT:  Negative for congestion, ear discharge and nosebleeds.   Eyes:  Negative for blurred vision.  Respiratory:  Positive for cough. Negative for hemoptysis, sputum production, shortness of breath and wheezing.   Cardiovascular:  Negative for chest pain, palpitations, orthopnea and claudication.  Gastrointestinal:  Negative for abdominal pain, blood in stool, constipation, diarrhea, heartburn, melena, nausea and vomiting.  Genitourinary:  Negative  for dysuria, flank pain, frequency, hematuria and urgency.  Musculoskeletal:  Negative for back pain, joint pain and myalgias.  Skin:  Negative for rash.  Neurological:  Negative for dizziness, tingling, focal weakness, seizures, weakness and headaches.  Endo/Heme/Allergies:  Does not bruise/bleed easily.  Psychiatric/Behavioral:  Negative for depression and suicidal ideas. The patient does not have insomnia.       No Known Allergies   Past Medical History:  Diagnosis Date   CKD (chronic kidney disease) stage 3, GFR 30-59 ml/min (HCC)    Hypertension    Multiple myeloma (HCC) 08/11/2017   Neuropathy    Pneumonia    Severe sepsis (HCC) 08/11/2017   Shingles 08/03/2019     Past Surgical History:  Procedure Laterality Date   ABDOMINAL SURGERY     COLONOSCOPY WITH PROPOFOL N/A 05/25/2021   Procedure: COLONOSCOPY WITH PROPOFOL;  Surgeon: Toney Reil, MD;  Location[facility] END[facility]COPY;  Service: Gastroenterology;  Laterality: N/A;   IR IMAGING GUIDED PORT INSERTION  08/02/2022   JOINT REPLACEMENT     right knee   KNEE SURGERY Left     Social History   Socioeconomic History   Marital status: Single    Spouse name: [name]   Number of children: 3   Years of education: Not on file   Highest education level: Not on file  Occupational History   Not on file  Tobacco Use   Smoking status: Former   Smokeless tobacco: Current    Types: Snuff  Vaping Use   Vaping Use: Never used  Substance and Sexual Activity   Alcohol use: No   Drug use: No   Sexual activity: Yes  Other Topics Concern   Not on file  Social History Narrative   Live in private residence with spouse and mother in law   Social Determinants of Health   Financial Resource Strain: Low Risk  (08/12/2017)   Overall Financial Resource Strain (CARDIA)    Difficulty of Paying Living Expenses: Not hard at all  Food Insecurity: No Food Insecurity (08/12/2017)   Hunger Vital Sign    Worried About Running Out of Food  in the Last Year: Never true    Ran Out of Food in the Last Year: Never true  Transportation Needs: No Transportation Needs (08/12/2017)   PRAPARE - Administrator, Civil Service (Medical): No    Lack of Transportation (Non-Medical): No  Physical Activity: Sufficiently Active (08/12/2017)   Exercise Vital Sign    Days of Exercise per Week: 7 days    Minutes of Exercise per Session: 30 min  Stress: No Stress Concern Present (08/12/2017)   [employer] of Occupational Health - Occupational Stress Questionnaire    Feeling of Stress : Only a little  Social Connections: Somewhat Isolated (08/12/2017)   Social Connection and Isolation Panel [NHANES]    Frequency of Communication with Friends and Family: Once a week    Frequency of Social Gatherings with Friends and Family: Once a week    Attends Religious Services: More than 4  times per year    Active Member of Clubs or Organizations: No    Attends Banker Meetings: Never    Marital Status: Married  Catering manager Violence: Not At Risk (08/12/2017)   Humiliation, Afraid, Rape, and Kick questionnaire    Fear of Current or Ex-Partner: No    Emotionally Abused: No    Physically Abused: No    Sexually Abused: No    Family History  Problem Relation Age of Onset   Cancer Mother 43       Pancreatic   COPD Mother    Diabetes Mother    Hyperlipidemia Mother    Hypertension Mother    Cancer Maternal Uncle        pancreatic   Cancer Maternal Grandmother 72       colon   COPD Maternal Grandmother    Diabetes Maternal Grandmother    Hypertension Maternal Grandmother      Current Outpatient Medications:    acyclovir (ZOVIRAX) 400 MG tablet, Take 1 tablet (400 mg total) by mouth 2 (two) times daily., Disp: 60 tablet, Rfl: 2   amitriptyline (ELAVIL) 10 MG tablet, Take 1 tablet (10 mg total) by mouth at bedtime., Disp: 30 tablet, Rfl: 1   amoxicillin-clavulanate (AUGMENTIN) 875-125 MG tablet, Take 1 tablet by  mouth 2 (two) times daily., Disp: 14 tablet, Rfl: 0   aspirin EC 81 MG tablet, Take by mouth., Disp: , Rfl:    baclofen (LIORESAL) 10 MG tablet, Take 1 tablet (10 mg total) by mouth 4 (four) times daily as needed for muscle spasms., Disp: 360 tablet, Rfl: 0   celecoxib (CELEBREX) 200 MG capsule, Take 1 capsule (200 mg total) by mouth daily., Disp: 30 capsule, Rfl: 2   cyanocobalamin (VITAMIN B12) 1000 MCG/ML injection, Inject 1 mL (1,000 mcg total) into the muscle every 30 (thirty) days., Disp: 1 mL, Rfl: 11   dexamethasone (DECADRON) 4 MG tablet, Take 5 tablets (20 mg total) by mouth once a week. Take first dose 1 hour prior to reporting to clinic for daratumumab. Take 2nd dose the day after daratumumab., Disp: 20 tablet, Rfl: 2   doxycycline (VIBRA-TABS) 100 MG tablet, Take 1 tablet (100 mg total) by mouth 2 (two) times daily for 7 days., Disp: 14 tablet, Rfl: 0   DULoxetine (CYMBALTA) 60 MG capsule, Take 1 capsule (60 mg total) by mouth daily., Disp: 60 capsule, Rfl: 1   levothyroxine (SYNTHROID) 50 MCG tablet, Take 1 tablet (50 mcg total) by mouth daily before breakfast., Disp: 30 tablet, Rfl: 2   lidocaine-prilocaine (EMLA) cream, Apply 1 Application topically as needed., Disp: 30 g, Rfl: 0   montelukast (SINGULAIR) 10 MG tablet, Take 1 tablet in the evening 1 day before your treatment. Day of treatment take 1 tablet in the morning for the first 2 treatments of Daratumumab, Disp: 4 tablet, Rfl: 0   OLANZapine (ZYPREXA) 10 MG tablet, Take 1 tablet (10 mg total) by mouth at bedtime. To help with nausea prevention., Disp: 30 tablet, Rfl: 1   OLANZapine (ZYPREXA) 10 MG tablet, Take by mouth., Disp: , Rfl:    oxyCODONE ER (XTAMPZA ER) 9 MG C12A, Take 9 mg by mouth every 12 (twelve) hours., Disp: 60 capsule, Rfl: 0   Oxycodone HCl 10 MG TABS, Take 1 tablet (10 mg total) by mouth every 8 (eight) hours as needed., Disp: 90 tablet, Rfl: 0   pomalidomide (POMALYST) 2 MG capsule, Take 1 capsule (2 mg total)  by mouth daily. Take  1 tablet daily for 21 days and 7 days off , Rems [id number] obtained 5/6, Disp: 21 capsule, Rfl: 0   pomalidomide (POMALYST) 2 MG capsule, Take 1 capsule (2 mg total) by mouth daily. Siri Cole #[id number]    Date Obtained 6/11/024, Disp: 21 capsule, Rfl: 0   potassium chloride SA (KLOR-CON M) 20 MEQ tablet, Take 2 tablets (40 mEq total) by mouth 2 (two) times daily., Disp: 120 tablet, Rfl: 0   pregabalin (LYRICA) 300 MG capsule, Take 1 capsule (300 mg total) by mouth 2 (two) times daily., Disp: 60 capsule, Rfl: 3   prochlorperazine (COMPAZINE) 10 MG tablet, Take by mouth., Disp: , Rfl:   Physical exam:  Vitals:   10/01/22 0836  BP: 116/80  Pulse: (!) 115  Temp: 98.8 F (37.1 C)  TempSrc: Tympanic  SpO2: 96%  Weight: 145 lb (65.8 kg)   Physical Exam Cardiovascular:     Rate and Rhythm: Regular rhythm. Tachycardia present.     Heart sounds: Normal heart sounds.  Pulmonary:     Effort: Pulmonary effort is normal.     Comments: Bibasilar rhonchi Abdominal:     General: Bowel sounds are normal.     Palpations: Abdomen is soft.  Skin:    General: Skin is warm and dry.  Neurological:     Mental Status: He is alert and oriented to person, place, and time.         Latest Ref Rng & Units 09/24/2022    8:36 AM  CMP  Glucose 70 - 99 mg/dL 147   BUN 6 - 20 mg/dL 18   Creatinine 8.29 - 1.24 mg/dL 5.62   Sodium 130 - 865 mmol/L 131   Potassium 3.5 - 5.1 mmol/L 3.1   Chloride 98 - 111 mmol/L 100   CO2 22 - 32 mmol/L 20   Calcium 8.9 - 10.3 mg/dL 7.9   Total Protein 6.5 - 8.1 g/dL 6.7   Total Bilirubin 0.3 - 1.2 mg/dL 1.1   Alkaline Phos 38 - 126 U/L 74   AST 15 - 41 U/L 30   ALT 0 - 44 U/L 22       Latest Ref Rng & Units 09/24/2022    8:36 AM  CBC  WBC 4.0 - 10.5 K/uL 7.0   Hemoglobin 13.0 - 17.0 g/dL 78.4   Hematocrit 69.6 - 52.0 % 38.3   Platelets 150 - 400 K/uL 175     No images are attached to the encounter.  DG Chest 2 View  Result Date:  09/19/2022 CLINICAL DATA:  Suspected sepsis, tachycardia EXAM: CHEST - 2 VIEW COMPARISON:  Chest x-ray August 11, 2017 FINDINGS: Right chest wall port catheter in place, terminating in the superior cavoatrial junction. The cardiomediastinal silhouette is unchanged in contour. No focal pulmonary opacity. No pleural effusion or pneumothorax. The visualized upper abdomen is unremarkable. No acute osseous abnormality. IMPRESSION: No active cardiopulmonary disease. Electronically Signed   By: Jacob Moores M.D.   On: 09/19/2022 09:10     Assessment and plan- Patient is a 52 y.o. male with history of kappa light chain multiple myeloma currently in relapse.  He is here for on treatment assessment prior to cycle 3-day 1 of carfilzomib  Patient has completed 1 week of antibiotics.  Symptoms are somewhat better but cough still persists.  I will give him another week of Augmentin as well as as needed albuterol today.  If symptoms get worse then we will consider getting a CT chest.  He has an echocardiogram coming up later this week.  Leukocytosis has resolved and counts are otherwise okay to proceed with cycle 3-day 1 of carfilzomib today.  He will directly proceed for cycle 3-day 8 next week and he will be seen by covering provider in 2 weeks for cycle 3-day 15.  He will also start taking his Pomalyst 2 mg 2 weeks on 1 week off today  Hypokalemia: He will receive 1 L of IV fluids along with IV potassium 20 mill equivalents.  Continue oral potassium  Continue aspirin and acyclovir prophylaxis   Visit Diagnosis 1. High risk medication use   2. Multiple myeloma in relapse (HCC)   3. Encounter for antineoplastic chemotherapy   4. Chemotherapy-induced peripheral neuropathy (HCC)   5. Hypokalemia      Dr. Owens Shark, MD, MPH [facility] at [facility] [phone] 10/01/2022 8:19 AM

## 2022-10-01 NOTE — Addendum Note (Signed)
Addended by: Corene Cornea on: 10/01/2022 10:07 AM   Modules accepted: Orders

## 2022-10-01 NOTE — Patient Instructions (Signed)
[facility] [place] [facility]  Discharge Instructions: Thank you for choosing [facility] to provide your oncology and hematology care.  If you have a lab appointment with the Cancer Center, please go directly to the Cancer Center and check in at the registration area.  Wear comfortable clothing and clothing appropriate for easy access to any Portacath or PICC line.   We strive to give you quality time with your provider. You may need to reschedule your appointment if you arrive late (15 or more minutes).  Arriving late affects you and other patients whose appointments are after yours.  Also, if you miss three or more appointments without notifying the office, you may be dismissed from the clinic at the provider's discretion.      For prescription refill requests, have your pharmacy contact our office and allow 72 hours for refills to be completed.     To help prevent nausea and vomiting after your treatment, we encourage you to take your nausea medication as directed.  BELOW ARE SYMPTOMS THAT SHOULD BE REPORTED IMMEDIATELY: *FEVER GREATER THAN 100.4 F (38 C) OR HIGHER *CHILLS OR SWEATING *NAUSEA AND VOMITING THAT IS NOT CONTROLLED WITH YOUR NAUSEA MEDICATION *UNUSUAL SHORTNESS OF BREATH *UNUSUAL BRUISING OR BLEEDING *URINARY PROBLEMS (pain or burning when urinating, or frequent urination) *BOWEL PROBLEMS (unusual diarrhea, constipation, pain near the anus) TENDERNESS IN MOUTH AND THROAT WITH OR WITHOUT PRESENCE OF ULCERS (sore throat, sores in mouth, or a toothache) UNUSUAL RASH, SWELLING OR PAIN  UNUSUAL VAGINAL DISCHARGE OR ITCHING   Items with * indicate a potential emergency and should be followed up as soon as possible or go to the Emergency Department if any problems should occur.  Please show the CHEMOTHERAPY ALERT CARD or IMMUNOTHERAPY ALERT CARD at check-in to the Emergency Department and triage nurse.  Should you have questions after your visit  or need to cancel or reschedule your appointment, please contact [facility] [place] [facility]  [phone] and follow the prompts.  Office hours are 8:00 a.m. to 4:30 p.m. Monday - Friday. Please note that voicemails left after 4:00 p.m. may not be returned until the following business day.  We are closed weekends and major holidays. You have access to a nurse at all times for urgent questions. Please call the main number to the clinic [phone] and follow the prompts.  For any non-urgent questions, you may also contact your provider using MyChart. We now offer e-Visits for anyone 18 and older to request care online for non-urgent symptoms. For details visit [email or web address].   Also download the MyChart app! Go to the app store, search "MyChart", open the app, select [facility], and log in with your MyChart username and password.    

## 2022-10-01 NOTE — Patient Instructions (Signed)

## 2022-10-01 NOTE — Addendum Note (Signed)
Addended by: Corene Cornea on: 10/01/2022 09:55 AM   Modules accepted: Orders

## 2022-10-04 ENCOUNTER — Ambulatory Visit
Admission: RE | Admit: 2022-10-04 | Discharge: 2022-10-04 | Disposition: A | Discharge status: Home / Self Care | Payer: BC Managed Care – PPO | Source: Ambulatory Visit | Attending: Oncology | Admitting: Oncology

## 2022-10-04 DIAGNOSIS — I129 Hypertensive chronic kidney disease with stage 1 through stage 4 chronic kidney disease, or unspecified chronic kidney disease: Secondary | ICD-10-CM | POA: Insufficient documentation

## 2022-10-04 DIAGNOSIS — N189 Chronic kidney disease, unspecified: Secondary | ICD-10-CM | POA: Insufficient documentation

## 2022-10-04 DIAGNOSIS — Z0189 Encounter for other specified special examinations: Secondary | ICD-10-CM

## 2022-10-04 DIAGNOSIS — C9002 Multiple myeloma in relapse: Secondary | ICD-10-CM | POA: Diagnosis not present

## 2022-10-04 LAB — ECHOCARDIOGRAM COMPLETE
AR max vel: 2.36 cm2
AV Area VTI: 2.63 cm2
AV Area mean vel: 2.53 cm2
AV Mean grad: 3 mmHg
AV Peak grad: 6.3 mmHg
Ao pk vel: 1.25 m/s
Area-P 1/2: 4.12 cm2
Calc EF: 55.9 %
MV VTI: 3.14 cm2
S' Lateral: 3.2 cm
Single Plane A2C EF: 51.6 %
Single Plane A4C EF: 64 %

## 2022-10-04 NOTE — Progress Notes (Signed)
*  PRELIMINARY RESULTS* Echocardiogram 2D Echocardiogram has been performed.  James House 10/04/2022, 12:01 PM

## 2022-10-07 ENCOUNTER — Other Ambulatory Visit (HOSPITAL_BASED_OUTPATIENT_CLINIC_OR_DEPARTMENT_OTHER): Payer: Self-pay

## 2022-10-07 MED FILL — Dexamethasone Sodium Phosphate Inj 100 MG/10ML: INTRAMUSCULAR | Qty: 2 | Status: AC

## 2022-10-08 ENCOUNTER — Ambulatory Visit: Payer: BC Managed Care – PPO | Admitting: Oncology

## 2022-10-08 ENCOUNTER — Inpatient Hospital Stay: Payer: BC Managed Care – PPO

## 2022-10-08 ENCOUNTER — Ambulatory Visit: Payer: BC Managed Care – PPO

## 2022-10-08 ENCOUNTER — Other Ambulatory Visit: Payer: BC Managed Care – PPO

## 2022-10-08 ENCOUNTER — Other Ambulatory Visit: Payer: Self-pay | Admitting: Oncology

## 2022-10-08 VITALS — BP 103/66 | HR 88 | Temp 97.3°F

## 2022-10-08 DIAGNOSIS — C9002 Multiple myeloma in relapse: Secondary | ICD-10-CM | POA: Diagnosis not present

## 2022-10-08 DIAGNOSIS — Z79899 Other long term (current) drug therapy: Secondary | ICD-10-CM | POA: Diagnosis not present

## 2022-10-08 DIAGNOSIS — E876 Hypokalemia: Secondary | ICD-10-CM

## 2022-10-08 DIAGNOSIS — Z5112 Encounter for antineoplastic immunotherapy: Secondary | ICD-10-CM | POA: Diagnosis not present

## 2022-10-08 LAB — CBC WITH DIFFERENTIAL (CANCER CENTER ONLY)
Abs Immature Granulocytes: 0.06 10*3/uL (ref 0.00–0.07)
Basophils Absolute: 0 10*3/uL (ref 0.0–0.1)
Basophils Relative: 0 %
Eosinophils Absolute: 0.1 10*3/uL (ref 0.0–0.5)
Eosinophils Relative: 2 %
HCT: 39.1 % (ref 39.0–52.0)
Hemoglobin: 13.2 g/dL (ref 13.0–17.0)
Immature Granulocytes: 1 %
Lymphocytes Relative: 14 %
Lymphs Abs: 1 10*3/uL (ref 0.7–4.0)
MCH: 31.5 pg (ref 26.0–34.0)
MCHC: 33.8 g/dL (ref 30.0–36.0)
MCV: 93.3 fL (ref 80.0–100.0)
Monocytes Absolute: 0.5 10*3/uL (ref 0.1–1.0)
Monocytes Relative: 6 %
Neutro Abs: 5.8 10*3/uL (ref 1.7–7.7)
Neutrophils Relative %: 77 %
Platelet Count: 410 10*3/uL — ABNORMAL HIGH (ref 150–400)
RBC: 4.19 MIL/uL — ABNORMAL LOW (ref 4.22–5.81)
RDW: 14 % (ref 11.5–15.5)
WBC Count: 7.5 10*3/uL (ref 4.0–10.5)
nRBC: 0 % (ref 0.0–0.2)

## 2022-10-08 LAB — CMP (CANCER CENTER ONLY)
ALT: 16 U/L (ref 0–44)
AST: 19 U/L (ref 15–41)
Albumin: 3.2 g/dL — ABNORMAL LOW (ref 3.5–5.0)
Alkaline Phosphatase: 115 U/L (ref 38–126)
Anion gap: 10 (ref 5–15)
BUN: 12 mg/dL (ref 6–20)
CO2: 21 mmol/L — ABNORMAL LOW (ref 22–32)
Calcium: 9.1 mg/dL (ref 8.9–10.3)
Chloride: 106 mmol/L (ref 98–111)
Creatinine: 1.39 mg/dL — ABNORMAL HIGH (ref 0.61–1.24)
GFR, Estimated: >60 mL/min (ref >60)
Glucose, Bld: 152 mg/dL — ABNORMAL HIGH (ref 70–99)
Potassium: 3.7 mmol/L (ref 3.5–5.1)
Sodium: 137 mmol/L (ref 135–145)
Total Bilirubin: 0.6 mg/dL (ref 0.3–1.2)
Total Protein: 6.6 g/dL (ref 6.5–8.1)

## 2022-10-08 LAB — POTASSIUM: Potassium: 3.7 mmol/L (ref 3.5–5.1)

## 2022-10-08 MED ORDER — SODIUM CHLORIDE 0.9 % IV SOLN
Freq: Once | INTRAVENOUS | Status: AC
  Filled 2022-10-08: qty 250

## 2022-10-08 MED ORDER — HEPARIN SOD (PORK) LOCK FLUSH 100 UNIT/ML IV SOLN
500.0000 [IU] | Freq: Once | INTRAVENOUS | Status: AC | PRN
  Administered 2022-10-08: 500 [IU]
  Filled 2022-10-08: qty 5

## 2022-10-08 MED ORDER — SODIUM CHLORIDE 0.9 % IV SOLN
20.0000 mg | Freq: Once | INTRAVENOUS | Status: AC
  Administered 2022-10-08: 20 mg via INTRAVENOUS
  Filled 2022-10-08: qty 20

## 2022-10-08 MED ORDER — DEXTROSE 5 % IV SOLN
20.0000 mg/m2 | Freq: Once | INTRAVENOUS | Status: AC
  Administered 2022-10-08: 36 mg via INTRAVENOUS
  Filled 2022-10-08: qty 15

## 2022-10-08 NOTE — Patient Instructions (Signed)
[facility] [place] [facility]  Discharge Instructions: Thank you for choosing [facility] to provide your oncology and hematology care.  If you have a lab appointment with the Cancer Center, please go directly to the Cancer Center and check in at the registration area.  Wear comfortable clothing and clothing appropriate for easy access to any Portacath or PICC line.   We strive to give you quality time with your provider. You may need to reschedule your appointment if you arrive late (15 or more minutes).  Arriving late affects you and other patients whose appointments are after yours.  Also, if you miss three or more appointments without notifying the office, you may be dismissed from the clinic at the provider's discretion.      For prescription refill requests, have your pharmacy contact our office and allow 72 hours for refills to be completed.    Today you received the following chemotherapy and/or immunotherapy agents Carfilzomib.      To help prevent nausea and vomiting after your treatment, we encourage you to take your nausea medication as directed.  BELOW ARE SYMPTOMS THAT SHOULD BE REPORTED IMMEDIATELY: *FEVER GREATER THAN 100.4 F (38 C) OR HIGHER *CHILLS OR SWEATING *NAUSEA AND VOMITING THAT IS NOT CONTROLLED WITH YOUR NAUSEA MEDICATION *UNUSUAL SHORTNESS OF BREATH *UNUSUAL BRUISING OR BLEEDING *URINARY PROBLEMS (pain or burning when urinating, or frequent urination) *BOWEL PROBLEMS (unusual diarrhea, constipation, pain near the anus) TENDERNESS IN MOUTH AND THROAT WITH OR WITHOUT PRESENCE OF ULCERS (sore throat, sores in mouth, or a toothache) UNUSUAL RASH, SWELLING OR PAIN  UNUSUAL VAGINAL DISCHARGE OR ITCHING   Items with * indicate a potential emergency and should be followed up as soon as possible or go to the Emergency Department if any problems should occur.  Please show the CHEMOTHERAPY ALERT CARD or IMMUNOTHERAPY ALERT CARD at check-in  to the Emergency Department and triage nurse.  Should you have questions after your visit or need to cancel or reschedule your appointment, please contact [facility] [place] [facility]  [phone] and follow the prompts.  Office hours are 8:00 a.m. to 4:30 p.m. Monday - Friday. Please note that voicemails left after 4:00 p.m. may not be returned until the following business day.  We are closed weekends and major holidays. You have access to a nurse at all times for urgent questions. Please call the main number to the clinic [phone] and follow the prompts.  For any non-urgent questions, you may also contact your provider using MyChart. We now offer e-Visits for anyone 18 and older to request care online for non-urgent symptoms. For details visit [email or web address].   Also download the MyChart app! Go to the app store, search "MyChart", open the app, select [facility], and log in with your MyChart username and password.    

## 2022-10-11 ENCOUNTER — Telehealth: Payer: BC Managed Care – PPO | Admitting: Internal Medicine

## 2022-10-14 ENCOUNTER — Other Ambulatory Visit: Payer: Self-pay

## 2022-10-14 ENCOUNTER — Other Ambulatory Visit: Payer: Self-pay | Admitting: Oncology

## 2022-10-14 MED ORDER — OXYCODONE HCL 10 MG PO TABS
10.0000 mg | ORAL_TABLET | Freq: Three times a day (TID) | ORAL | 0 refills | Status: DC | PRN
  Filled 2022-10-14: qty 90, 30d supply, fill #0

## 2022-10-14 MED FILL — Dexamethasone Sodium Phosphate Inj 100 MG/10ML: INTRAMUSCULAR | Qty: 2 | Status: AC

## 2022-10-15 ENCOUNTER — Inpatient Hospital Stay (HOSPITAL_BASED_OUTPATIENT_CLINIC_OR_DEPARTMENT_OTHER): Payer: BC Managed Care – PPO | Admitting: Nurse Practitioner

## 2022-10-15 ENCOUNTER — Inpatient Hospital Stay: Payer: BC Managed Care – PPO

## 2022-10-15 ENCOUNTER — Encounter: Payer: Self-pay | Admitting: Nurse Practitioner

## 2022-10-15 VITALS — BP 124/60 | HR 90 | Temp 96.9°F | Resp 18 | Ht 67.0 in | Wt 150.2 lb

## 2022-10-15 DIAGNOSIS — C9002 Multiple myeloma in relapse: Secondary | ICD-10-CM

## 2022-10-15 DIAGNOSIS — Z5111 Encounter for antineoplastic chemotherapy: Secondary | ICD-10-CM

## 2022-10-15 DIAGNOSIS — Z5112 Encounter for antineoplastic immunotherapy: Secondary | ICD-10-CM | POA: Diagnosis not present

## 2022-10-15 DIAGNOSIS — Z79899 Other long term (current) drug therapy: Secondary | ICD-10-CM | POA: Diagnosis not present

## 2022-10-15 LAB — CBC WITH DIFFERENTIAL (CANCER CENTER ONLY)
Abs Immature Granulocytes: 0.03 10*3/uL (ref 0.00–0.07)
Basophils Absolute: 0 10*3/uL (ref 0.0–0.1)
Basophils Relative: 1 %
Eosinophils Absolute: 0.2 10*3/uL (ref 0.0–0.5)
Eosinophils Relative: 4 %
HCT: 39.2 % (ref 39.0–52.0)
Hemoglobin: 13 g/dL (ref 13.0–17.0)
Immature Granulocytes: 1 %
Lymphocytes Relative: 32 %
Lymphs Abs: 1.4 10*3/uL (ref 0.7–4.0)
MCH: 31.3 pg (ref 26.0–34.0)
MCHC: 33.2 g/dL (ref 30.0–36.0)
MCV: 94.2 fL (ref 80.0–100.0)
Monocytes Absolute: 0.4 10*3/uL (ref 0.1–1.0)
Monocytes Relative: 8 %
Neutro Abs: 2.3 10*3/uL (ref 1.7–7.7)
Neutrophils Relative %: 54 %
Platelet Count: 227 10*3/uL (ref 150–400)
RBC: 4.16 MIL/uL — ABNORMAL LOW (ref 4.22–5.81)
RDW: 14.8 % (ref 11.5–15.5)
WBC Count: 4.3 10*3/uL (ref 4.0–10.5)
nRBC: 0 % (ref 0.0–0.2)

## 2022-10-15 LAB — CMP (CANCER CENTER ONLY)
ALT: 14 U/L (ref 0–44)
AST: 22 U/L (ref 15–41)
Albumin: 3.4 g/dL — ABNORMAL LOW (ref 3.5–5.0)
Alkaline Phosphatase: 109 U/L (ref 38–126)
Anion gap: 9 (ref 5–15)
BUN: 11 mg/dL (ref 6–20)
CO2: 22 mmol/L (ref 22–32)
Calcium: 8.9 mg/dL (ref 8.9–10.3)
Chloride: 108 mmol/L (ref 98–111)
Creatinine: 1.4 mg/dL — ABNORMAL HIGH (ref 0.61–1.24)
GFR, Estimated: >60 mL/min (ref >60)
Glucose, Bld: 157 mg/dL — ABNORMAL HIGH (ref 70–99)
Potassium: 3.5 mmol/L (ref 3.5–5.1)
Sodium: 139 mmol/L (ref 135–145)
Total Bilirubin: 0.7 mg/dL (ref 0.3–1.2)
Total Protein: 6.1 g/dL — ABNORMAL LOW (ref 6.5–8.1)

## 2022-10-15 MED ORDER — HEPARIN SOD (PORK) LOCK FLUSH 100 UNIT/ML IV SOLN
500.0000 [IU] | Freq: Once | INTRAVENOUS | Status: AC | PRN
  Filled 2022-10-15: qty 5

## 2022-10-15 MED ORDER — DEXTROSE 5 % IV SOLN
20.0000 mg/m2 | Freq: Once | INTRAVENOUS | Status: AC
  Administered 2022-10-15: 36 mg via INTRAVENOUS
  Filled 2022-10-15: qty 18

## 2022-10-15 MED ORDER — HEPARIN SOD (PORK) LOCK FLUSH 100 UNIT/ML IV SOLN
INTRAVENOUS | Status: AC
  Administered 2022-10-15: 500 [IU]
  Filled 2022-10-15: qty 5

## 2022-10-15 MED ORDER — SODIUM CHLORIDE 0.9 % IV SOLN
Freq: Once | INTRAVENOUS | Status: AC
  Filled 2022-10-15: qty 250

## 2022-10-15 MED ORDER — SODIUM CHLORIDE 0.9 % IV SOLN
20.0000 mg | Freq: Once | INTRAVENOUS | Status: AC
  Administered 2022-10-15: 20 mg via INTRAVENOUS
  Filled 2022-10-15: qty 20

## 2022-10-15 NOTE — Progress Notes (Signed)
No concerns. 

## 2022-10-15 NOTE — Progress Notes (Signed)
Hematology/Oncology Consult Note [facility]  [phone] [phone]  Patient Care Team: Doreene Nest, NP as PCP - General (Internal Medicine) Martin Majestic, MD as PCP - Hematology/Oncology Creig Hines, MD as Consulting Physician (Hematology and Oncology)   Name of the patient: James House  [id number]  30-Nov-1970   Date of visit: 10/15/22  Diagnosis- kappa light chain multiple myeloma in relapse   Chief complaint/ Reason for visit-on treatment assessment prior to cycle 3- day 15 of carfilzomib  Heme/Onc history: Patient is a 89- year-old male with a history of kappa light chain myeloma that was treated by [name].  History is as follows:   1.  Patient presented with renal insufficiency with a creatinine of 2.4 in April 2018.  Kidney biopsy showed myeloma cast nephropathy.  Kappa light chain was 3004 and lambda 0.22 with a kappa lambda ratio of 13,000 655.  Skeletal survey showed multiple calvarial and marrow lesions.  Bone marrow biopsy in May 2018 showed 43% plasma cells.  He received CyBorD for cycle 1 in May 2018 and was subsequently switched to RVD.  He received 4 cycles followed by a repeat bone marrow biopsy in August 2018 which showed no increase in plasma cells.   2.  He underwent autologous stem cell transplantation on 01/30/2017.  He was started on Revlimid maintenance 10 mg daily in January 2019.   3.  Labs from January February and March 2019 done at [facility] showed no M spike, normal kappa lambda ratio of 1.06.  He was last seen by them in April 2019.   4. Following that patient was admitted to [facility] for healthcare associated pneumonia and was recently discharged.  He wished to transfer his care to [place] at this time.  He was on maintenance Revlimid 5 mg which was then reduced to 2.5 mg.  Follows up with pain clinic for chemo-induced peripheral neuropathy with [name].  His urine tested positive for [facility] and therefore  patient was discharged from pain clinic   5.  Noted to have gradually increasing free kappa light chainFrom 15 in 20 21-39.5 with a ratio of 3.09.  He was seen by [facility] again and underwent a repeat bone marrow biopsy which did not show any evidence of clonal plasma cells.  PET CT scan also did not show any new active lytic lesions in May 2023.Marland Kitchen  Revlimid dose was increased to 15 mg 3 weeks on 1 week off but kappa light chains show continuing to increase   6.  PET CT scan in December 2023 did not show any evidence of active myeloma.  Patient underwent repeat bone marrow biopsy at [facility] which showed 6% plasma cells consistent with persistent plasma cell neoplasm.  74 to 86% of isolated plasma cells show loss of T p53 and monosomy 13.  Patient was seen by [name] at [facility] and was recommended Darzalex Pomalyst dexamethasone regimen   7.  Disease progression after 8 weekly cycles of Darazalex kappa light chain increased from 332-646 with a ratio that went up from 39-57 concerning for disease progression.  Patient switched to carfilzomib Pomalyst dexamethasone regimen  Interval history- Patient is 52 year old male who returns to clinic for consideration of C3D15 of carfilzomib + pomalidomide + dex. His cough has resolved. Weight is up and he feels well. Denies complaints. He has ongoing low back pain which is unchanged. Staying with his daughter who accompanies him today.   ECOG PS- 2 Pain scale-  7 Opioid associated constipation- no  Review of systems- Review of Systems  Constitutional:  Positive for malaise/fatigue. Negative for chills, fever and weight loss.  HENT:  Negative for congestion, ear discharge and nosebleeds.   Eyes:  Negative for blurred vision.  Respiratory:  Negative for cough, hemoptysis, sputum production, shortness of breath and wheezing.   Cardiovascular:  Negative for chest pain, palpitations, orthopnea and claudication.  Gastrointestinal:  Negative for abdominal pain, blood in stool,  constipation, diarrhea, heartburn, melena, nausea and vomiting.  Genitourinary:  Negative for dysuria, flank pain, frequency, hematuria and urgency.  Musculoskeletal:  Positive for back pain and joint pain. Negative for myalgias.  Skin:  Negative for rash.  Neurological:  Negative for dizziness, tingling, focal weakness, seizures, weakness and headaches.  Endo/Heme/Allergies:  Does not bruise/bleed easily.  Psychiatric/Behavioral:  Negative for depression and suicidal ideas. The patient does not have insomnia.      No Known Allergies   Past Medical History:  Diagnosis Date   CKD (chronic kidney disease) stage 3, GFR 30-59 ml/min (HCC)    Hypertension    Multiple myeloma (HCC) 08/11/2017   Neuropathy    Pneumonia    Severe sepsis (HCC) 08/11/2017   Shingles 08/03/2019    Past Surgical History:  Procedure Laterality Date   ABDOMINAL SURGERY     COLONOSCOPY WITH PROPOFOL N/A 05/25/2021   Procedure: COLONOSCOPY WITH PROPOFOL;  Surgeon: Toney Reil, MD;  Location: [facility] ENDOSCOPY;  Service: Gastroenterology;  Laterality: N/A;   IR IMAGING GUIDED PORT INSERTION  08/02/2022   JOINT REPLACEMENT     right knee   KNEE SURGERY Left     Social History   Socioeconomic History   Marital status: Single    Spouse name: [name]   Number of children: 3   Years of education: Not on file   Highest education level: Not on file  Occupational History   Not on file  Tobacco Use   Smoking status: Former   Smokeless tobacco: Current    Types: Snuff  Vaping Use   Vaping Use: Never used  Substance and Sexual Activity   Alcohol use: No   Drug use: No   Sexual activity: Yes  Other Topics Concern   Not on file  Social History Narrative   Live in private residence with spouse and mother in law   Social Determinants of Health   Financial Resource Strain: Low Risk  (08/12/2017)   Overall Financial Resource Strain (CARDIA)    Difficulty of Paying Living Expenses: Not hard at all  Food  Insecurity: No Food Insecurity (08/12/2017)   Hunger Vital Sign    Worried About Running Out of Food in the Last Year: Never true    Ran Out of Food in the Last Year: Never true  Transportation Needs: No Transportation Needs (08/12/2017)   PRAPARE - Administrator, Civil Service (Medical): No    Lack of Transportation (Non-Medical): No  Physical Activity: Sufficiently Active (08/12/2017)   Exercise Vital Sign    Days of Exercise per Week: 7 days    Minutes of Exercise per Session: 30 min  Stress: No Stress Concern Present (08/12/2017)   [employer] of Occupational Health - Occupational Stress Questionnaire    Feeling of Stress : Only a little  Social Connections: Somewhat Isolated (08/12/2017)   Social Connection and Isolation Panel [NHANES]    Frequency of Communication with Friends and Family: Once a week    Frequency of Social Gatherings with Friends  and Family: Once a week    Attends Religious Services: More than 4 times per year    Active Member of Clubs or Organizations: No    Attends Banker Meetings: Never    Marital Status: Married  Catering manager Violence: Not At Risk (08/12/2017)   Humiliation, Afraid, Rape, and Kick questionnaire    Fear of Current or Ex-Partner: No    Emotionally Abused: No    Physically Abused: No    Sexually Abused: No    Family History  Problem Relation Age of Onset   Cancer Mother 74       Pancreatic   COPD Mother    Diabetes Mother    Hyperlipidemia Mother    Hypertension Mother    Cancer Maternal Uncle        pancreatic   Cancer Maternal Grandmother 21       colon   COPD Maternal Grandmother    Diabetes Maternal Grandmother    Hypertension Maternal Grandmother      Current Outpatient Medications:    acyclovir (ZOVIRAX) 400 MG tablet, Take 1 tablet (400 mg total) by mouth 2 (two) times daily., Disp: 60 tablet, Rfl: 2   albuterol (VENTOLIN HFA) 108 (90 Base) MCG/ACT inhaler, Inhale 2 puffs into the lungs  every 6 (six) hours as needed for wheezing or shortness of breath., Disp: 6.7 g, Rfl: 2   amitriptyline (ELAVIL) 10 MG tablet, Take 1 tablet (10 mg total) by mouth at bedtime., Disp: 30 tablet, Rfl: 1   amoxicillin-clavulanate (AUGMENTIN) 875-125 MG tablet, Take 1 tablet by mouth 2 (two) times daily., Disp: 14 tablet, Rfl: 0   aspirin EC 81 MG tablet, Take by mouth., Disp: , Rfl:    baclofen (LIORESAL) 10 MG tablet, Take 1 tablet (10 mg total) by mouth 4 (four) times daily as needed for muscle spasms., Disp: 360 tablet, Rfl: 0   celecoxib (CELEBREX) 200 MG capsule, Take 1 capsule (200 mg total) by mouth daily., Disp: 30 capsule, Rfl: 2   cyanocobalamin (VITAMIN B12) 1000 MCG/ML injection, Inject 1 mL (1,000 mcg total) into the muscle every 30 (thirty) days., Disp: 1 mL, Rfl: 11   dexamethasone (DECADRON) 4 MG tablet, Take 5 tablets (20 mg total) by mouth once a week. Take first dose 1 hour prior to reporting to clinic for daratumumab. Take 2nd dose the day after daratumumab., Disp: 20 tablet, Rfl: 2   DULoxetine (CYMBALTA) 60 MG capsule, Take 1 capsule (60 mg total) by mouth daily., Disp: 60 capsule, Rfl: 1   levothyroxine (SYNTHROID) 50 MCG tablet, Take 1 tablet (50 mcg total) by mouth daily before breakfast., Disp: 30 tablet, Rfl: 2   lidocaine-prilocaine (EMLA) cream, Apply 1 Application topically as needed., Disp: 30 g, Rfl: 0   montelukast (SINGULAIR) 10 MG tablet, Take 1 tablet in the evening 1 day before your treatment. Day of treatment take 1 tablet in the morning for the first 2 treatments of Daratumumab, Disp: 4 tablet, Rfl: 0   OLANZapine (ZYPREXA) 10 MG tablet, Take 1 tablet (10 mg total) by mouth at bedtime. To help with nausea prevention., Disp: 30 tablet, Rfl: 1   OLANZapine (ZYPREXA) 10 MG tablet, Take by mouth., Disp: , Rfl:    oxyCODONE ER (XTAMPZA ER) 9 MG C12A, Take 9 mg by mouth every 12 (twelve) hours., Disp: 60 capsule, Rfl: 0   Oxycodone HCl 10 MG TABS, Take 1 tablet (10 mg  total) by mouth every 8 (eight) hours as needed., Disp: 90  tablet, Rfl: 0   pomalidomide (POMALYST) 2 MG capsule, Take 1 capsule (2 mg total) by mouth daily. Take 1 tablet daily for 21 days and 7 days off , Rems [id number] obtained 5/6, Disp: 21 capsule, Rfl: 0   pomalidomide (POMALYST) 2 MG capsule, Take 1 capsule (2 mg total) by mouth daily. Siri Cole #[id number]    Date Obtained 6/11/024, Disp: 21 capsule, Rfl: 0   potassium chloride SA (KLOR-CON M) 20 MEQ tablet, Take 2 tablets (40 mEq total) by mouth 2 (two) times daily., Disp: 120 tablet, Rfl: 0   pregabalin (LYRICA) 300 MG capsule, Take 1 capsule (300 mg total) by mouth 2 (two) times daily., Disp: 60 capsule, Rfl: 3   prochlorperazine (COMPAZINE) 10 MG tablet, Take by mouth., Disp: , Rfl:   Physical exam:  Vitals:   10/15/22 0843 10/15/22 0945  BP: (!) 132/112 124/60  Pulse: 90   Resp: 18   Temp: (!) 96.9 F (36.1 C)   TempSrc: Tympanic   Weight: 150 lb 3.2 oz (68.1 kg)   Height: 5\' 7"  (1.702 m)    Physical Exam Vitals reviewed.  Constitutional:      Appearance: He is not ill-appearing.  Cardiovascular:     Rate and Rhythm: Normal rate and regular rhythm.  Pulmonary:     Effort: Pulmonary effort is normal. No respiratory distress.     Comments: Diminished bilaterally.  Abdominal:     General: There is no distension.     Palpations: Abdomen is soft.     Tenderness: There is no abdominal tenderness.  Skin:    General: Skin is warm and dry.  Neurological:     Mental Status: He is alert and oriented to person, place, and time.  Psychiatric:        Mood and Affect: Mood normal.        Behavior: Behavior normal.        Latest Ref Rng & Units 10/15/2022    8:29 AM  CMP  Glucose 70 - 99 mg/dL 147   BUN 6 - 20 mg/dL 11   Creatinine 8.29 - 1.24 mg/dL 5.62   Sodium 130 - 865 mmol/L 139   Potassium 3.5 - 5.1 mmol/L 3.5   Chloride 98 - 111 mmol/L 108   CO2 22 - 32 mmol/L 22   Calcium 8.9 - 10.3 mg/dL 8.9   Total Protein  6.5 - 8.1 g/dL 6.1   Total Bilirubin 0.3 - 1.2 mg/dL 0.7   Alkaline Phos 38 - 126 U/L 109   AST 15 - 41 U/L 22   ALT 0 - 44 U/L 14       Latest Ref Rng & Units 10/15/2022    8:29 AM  CBC  WBC 4.0 - 10.5 K/uL 4.3   Hemoglobin 13.0 - 17.0 g/dL 78.4   Hematocrit 69.6 - 52.0 % 39.2   Platelets 150 - 400 K/uL 227     ECHOCARDIOGRAM COMPLETE  Result Date: 10/04/2022    ECHOCARDIOGRAM REPORT   Patient Name:   James House. Date of Exam: 10/04/2022 Medical Rec #:  [id number]        Height:       67.0 in Accession #:    [id number]       Weight:       145.0 lb Date of Birth:  [birth year]         BSA:          1.764 m Patient Age:  51 years         BP:           116/80 mmHg Patient Gender: M                HR:           91 bpm. Exam Location:  [facility] Procedure: 2D Echo, Cardiac Doppler, Color Doppler and Strain Analysis Indications:     Chemo  History:         Patient has prior history of Echocardiogram examinations, most                  recent 08/24/2017. Risk Factors:Hypertension. CKD.  Sonographer:     Mikki Harbor Referring Phys:  [id number] Pete Glatter RAO Diagnosing Phys: Julien Nordmann MD  Sonographer Comments: Global longitudinal strain was attempted. IMPRESSIONS  1. Left ventricular ejection fraction, by estimation, is 55 to 60%. Left ventricular ejection fraction by PLAX is 58 %. The left ventricle has normal function. The left ventricle has no regional wall motion abnormalities. Left ventricular diastolic parameters are consistent with Grade I diastolic dysfunction (impaired relaxation). The average left ventricular global longitudinal strain is -11.7 %.  2. Right ventricular systolic function is normal. The right ventricular size is normal. There is normal pulmonary artery systolic pressure.  3. The mitral valve is normal in structure. Mild mitral valve regurgitation. No evidence of mitral stenosis.  4. The aortic valve is normal in structure. Aortic valve regurgitation is not visualized. No aortic  stenosis is present.  5. The inferior vena cava is normal in size with greater than 50% respiratory variability, suggesting right atrial pressure of 3 mmHg. FINDINGS  Left Ventricle: Left ventricular ejection fraction, by estimation, is 55 to 60%. Left ventricular ejection fraction by PLAX is 58 %. The left ventricle has normal function. The left ventricle has no regional wall motion abnormalities. The average left ventricular global longitudinal strain is -11.7 %. The left ventricular internal cavity size was normal in size. There is no left ventricular hypertrophy. Left ventricular diastolic parameters are consistent with Grade I diastolic dysfunction (impaired relaxation). Right Ventricle: The right ventricular size is normal. No increase in right ventricular wall thickness. Right ventricular systolic function is normal. There is normal pulmonary artery systolic pressure. The tricuspid regurgitant velocity is 2.20 m/s, and  with an assumed right atrial pressure of 3 mmHg, the estimated right ventricular systolic pressure is 22.4 mmHg. Left Atrium: Left atrial size was normal in size. Right Atrium: Right atrial size was normal in size. Pericardium: There is no evidence of pericardial effusion. Mitral Valve: The mitral valve is normal in structure. Mild mitral valve regurgitation. No evidence of mitral valve stenosis. MV peak gradient, 2.0 mmHg. The mean mitral valve gradient is 1.0 mmHg. Tricuspid Valve: The tricuspid valve is normal in structure. Tricuspid valve regurgitation is mild . No evidence of tricuspid stenosis. Aortic Valve: The aortic valve is normal in structure. Aortic valve regurgitation is not visualized. No aortic stenosis is present. Aortic valve mean gradient measures 3.0 mmHg. Aortic valve peak gradient measures 6.2 mmHg. Aortic valve area, by VTI measures 2.63 cm. Pulmonic Valve: The pulmonic valve was normal in structure. Pulmonic valve regurgitation is not visualized. No evidence of pulmonic  stenosis. Aorta: The aortic root is normal in size and structure. Venous: The inferior vena cava is normal in size with greater than 50% respiratory variability, suggesting right atrial pressure of 3 mmHg. IAS/Shunts: No atrial level shunt detected by color flow Doppler.  LEFT VENTRICLE PLAX 2D LV EF:         Left            Diastology                ventricular     LV e' medial:    7.29 cm/s                ejection        LV E/e' medial:  5.8                fraction by     LV e' lateral:   9.03 cm/s                PLAX is 58      LV E/e' lateral: 4.7                %. LVIDd:         4.60 cm         2D LVIDs:         3.20 cm         Longitudinal LV PW:         1.00 cm         Strain LV IVS:        1.00 cm         2D Strain GLS  -11.7 % LVOT diam:     2.00 cm         Avg: LV SV:         53 LV SV Index:   30 LVOT Area:     3.14 cm  LV Volumes (MOD) LV vol d, MOD    40.7 ml A2C: LV vol d, MOD    67.8 ml A4C: LV vol s, MOD    19.7 ml A2C: LV vol s, MOD    24.4 ml A4C: LV SV MOD A2C:   21.0 ml LV SV MOD A4C:   67.8 ml LV SV MOD BP:    31.2 ml RIGHT VENTRICLE RV Basal diam:  3.15 cm RV Mid diam:    2.80 cm RV S prime:     12.60 cm/s TAPSE (M-mode): 2.1 cm LEFT ATRIUM             Index        RIGHT ATRIUM           Index LA diam:        3.40 cm 1.93 cm/m   RA Area:     11.70 cm LA Vol (A2C):   31.2 ml 17.69 ml/m  RA Volume:   24.70 ml  14.00 ml/m LA Vol (A4C):   25.0 ml 14.17 ml/m LA Biplane Vol: 28.6 ml 16.21 ml/m  AORTIC VALVE                    PULMONIC VALVE AV Area (Vmax):    2.36 cm     PV Vmax:       1.45 m/s AV Area (Vmean):   2.53 cm     PV Peak grad:  8.4 mmHg AV Area (VTI):     2.63 cm AV Vmax:           125.00 cm/s AV Vmean:          78.000 cm/s AV VTI:            0.203 m AV Peak Grad:      6.2 mmHg AV Mean Grad:  3.0 mmHg LVOT Vmax:         94.10 cm/s LVOT Vmean:        62.800 cm/s LVOT VTI:          0.170 m LVOT/AV VTI ratio: 0.84  AORTA Ao Root diam: 3.50 cm MITRAL VALVE               TRICUSPID  VALVE MV Area (PHT): 4.12 cm    TR Peak grad:   19.4 mmHg MV Area VTI:   3.14 cm    TR Vmax:        220.00 cm/s MV Peak grad:  2.0 mmHg MV Mean grad:  1.0 mmHg    SHUNTS MV Vmax:       0.70 m/s    Systemic VTI:  0.17 m MV Vmean:      39.0 cm/s   Systemic Diam: 2.00 cm MV Decel Time: 184 msec MV E velocity: 42.60 cm/s MV A velocity: 54.80 cm/s MV E/A ratio:  0.78 Julien Nordmann MD Electronically signed by Julien Nordmann MD Signature Date/Time: 10/04/2022/4:53:24 PM    Final    DG Chest 2 View  Result Date: 09/19/2022 CLINICAL DATA:  Suspected sepsis, tachycardia EXAM: CHEST - 2 VIEW COMPARISON:  Chest x-ray August 11, 2017 FINDINGS: Right chest wall port catheter in place, terminating in the superior cavoatrial junction. The cardiomediastinal silhouette is unchanged in contour. No focal pulmonary opacity. No pleural effusion or pneumothorax. The visualized upper abdomen is unremarkable. No acute osseous abnormality. IMPRESSION: No active cardiopulmonary disease. Electronically Signed   By: Jacob Moores M.D.   On: 09/19/2022 09:10     Assessment and plan- Patient is a 52 y.o. male with   Relapsed kappa light chain multiple myeloma- here for on treatment assessment prior to cycle 3-day 15 of carfilzomib. Continue pomalyst 2 mg, 2 weeks on, 1 week off. Dex 20 mg weekly. Labs reviewed and acceptable for treatment. Proceed with carfilzomib today.  URI- s/p 2 weeks augmentin and albuterol. Resolved.  Hypertension- s/p echo. LVEF 55-60%. Normal function. Left ventricular diastolic parameters consistent with Grade I dysfunction manifesting as impaired relaxation. RV function normal.  Leukocytosis- Resolved.  Hypokalemia- K 3.5. Monitor.  Prophylaxis- continue aspirin and acyclovir.  CKD- Creatinine 1.40. Stable.   Disposition:  Treatment today He will follow up with Dr Smith Robert on 10/29/22 for consideration of cycle 4 as scheduled.    Visit Diagnosis 1. Encounter for antineoplastic chemotherapy   2.  Multiple myeloma in relapse (HCC)    Consuello Masse, DNP, AGNP-C, AOCNP [facility] [phone] (clinic) 10/15/2022

## 2022-10-16 ENCOUNTER — Other Ambulatory Visit: Payer: Self-pay | Admitting: Primary Care

## 2022-10-16 ENCOUNTER — Other Ambulatory Visit: Payer: Self-pay

## 2022-10-16 DIAGNOSIS — R519 Headache, unspecified: Secondary | ICD-10-CM

## 2022-10-16 DIAGNOSIS — G43709 Chronic migraine without aura, not intractable, without status migrainosus: Secondary | ICD-10-CM

## 2022-10-17 ENCOUNTER — Other Ambulatory Visit: Payer: Self-pay

## 2022-10-21 ENCOUNTER — Other Ambulatory Visit: Payer: Self-pay

## 2022-10-29 ENCOUNTER — Inpatient Hospital Stay: Payer: BC Managed Care – PPO

## 2022-10-29 ENCOUNTER — Encounter: Payer: Self-pay | Admitting: Oncology

## 2022-10-29 ENCOUNTER — Other Ambulatory Visit: Payer: Self-pay | Admitting: Oncology

## 2022-10-29 ENCOUNTER — Inpatient Hospital Stay: Payer: BC Managed Care – PPO | Attending: Internal Medicine

## 2022-10-29 ENCOUNTER — Inpatient Hospital Stay (HOSPITAL_BASED_OUTPATIENT_CLINIC_OR_DEPARTMENT_OTHER): Payer: BC Managed Care – PPO | Admitting: Oncology

## 2022-10-29 VITALS — BP 131/91 | HR 90 | Temp 97.5°F | Ht 67.0 in | Wt 152.9 lb

## 2022-10-29 DIAGNOSIS — Z5111 Encounter for antineoplastic chemotherapy: Secondary | ICD-10-CM

## 2022-10-29 DIAGNOSIS — C9002 Multiple myeloma in relapse: Secondary | ICD-10-CM

## 2022-10-29 DIAGNOSIS — E876 Hypokalemia: Secondary | ICD-10-CM | POA: Diagnosis not present

## 2022-10-29 DIAGNOSIS — Z5112 Encounter for antineoplastic immunotherapy: Secondary | ICD-10-CM | POA: Insufficient documentation

## 2022-10-29 DIAGNOSIS — Z79899 Other long term (current) drug therapy: Secondary | ICD-10-CM | POA: Insufficient documentation

## 2022-10-29 LAB — CBC WITH DIFFERENTIAL (CANCER CENTER ONLY)
Abs Immature Granulocytes: 0.02 10*3/uL (ref 0.00–0.07)
Basophils Absolute: 0 10*3/uL (ref 0.0–0.1)
Basophils Relative: 1 %
Eosinophils Absolute: 0.3 10*3/uL (ref 0.0–0.5)
Eosinophils Relative: 6 %
HCT: 40 % (ref 39.0–52.0)
Hemoglobin: 13.5 g/dL (ref 13.0–17.0)
Immature Granulocytes: 0 %
Lymphocytes Relative: 22 %
Lymphs Abs: 1.2 10*3/uL (ref 0.7–4.0)
MCH: 32.3 pg (ref 26.0–34.0)
MCHC: 33.8 g/dL (ref 30.0–36.0)
MCV: 95.7 fL (ref 80.0–100.0)
Monocytes Absolute: 0.3 10*3/uL (ref 0.1–1.0)
Monocytes Relative: 5 %
Neutro Abs: 3.4 10*3/uL (ref 1.7–7.7)
Neutrophils Relative %: 66 %
Platelet Count: 155 10*3/uL (ref 150–400)
RBC: 4.18 MIL/uL — ABNORMAL LOW (ref 4.22–5.81)
RDW: 15.7 % — ABNORMAL HIGH (ref 11.5–15.5)
WBC Count: 5.2 10*3/uL (ref 4.0–10.5)
nRBC: 0 % (ref 0.0–0.2)

## 2022-10-29 LAB — CMP (CANCER CENTER ONLY)
ALT: 17 U/L (ref 0–44)
AST: 29 U/L (ref 15–41)
Albumin: 3.6 g/dL (ref 3.5–5.0)
Alkaline Phosphatase: 101 U/L (ref 38–126)
Anion gap: 11 (ref 5–15)
BUN: 14 mg/dL (ref 6–20)
CO2: 21 mmol/L — ABNORMAL LOW (ref 22–32)
Calcium: 9 mg/dL (ref 8.9–10.3)
Chloride: 108 mmol/L (ref 98–111)
Creatinine: 1.35 mg/dL — ABNORMAL HIGH (ref 0.61–1.24)
GFR, Estimated: >60 mL/min (ref >60)
Glucose, Bld: 194 mg/dL — ABNORMAL HIGH (ref 70–99)
Potassium: 3.2 mmol/L — ABNORMAL LOW (ref 3.5–5.1)
Sodium: 140 mmol/L (ref 135–145)
Total Bilirubin: 0.6 mg/dL (ref 0.3–1.2)
Total Protein: 6.3 g/dL — ABNORMAL LOW (ref 6.5–8.1)

## 2022-10-29 MED ORDER — POTASSIUM CHLORIDE 20 MEQ/100ML IV SOLN
20.0000 meq | Freq: Once | INTRAVENOUS | Status: AC
  Administered 2022-10-29: 20 meq via INTRAVENOUS

## 2022-10-29 MED ORDER — SODIUM CHLORIDE 0.9 % IV SOLN
Freq: Once | INTRAVENOUS | Status: AC
  Filled 2022-10-29: qty 250

## 2022-10-29 MED ORDER — HEPARIN SOD (PORK) LOCK FLUSH 100 UNIT/ML IV SOLN
500.0000 [IU] | Freq: Once | INTRAVENOUS | Status: AC | PRN
  Administered 2022-10-29: 500 [IU]
  Filled 2022-10-29: qty 5

## 2022-10-29 MED ORDER — SODIUM CHLORIDE 0.9 % IV SOLN
Freq: Once | INTRAVENOUS | Status: DC
  Filled 2022-10-29: qty 250

## 2022-10-29 MED ORDER — DEXTROSE 5 % IV SOLN
20.0000 mg/m2 | Freq: Once | INTRAVENOUS | Status: AC
  Administered 2022-10-29: 36 mg via INTRAVENOUS
  Filled 2022-10-29: qty 15

## 2022-10-29 MED ORDER — SODIUM CHLORIDE 0.9 % IV SOLN
20.0000 mg | Freq: Once | INTRAVENOUS | Status: AC
  Administered 2022-10-29: 20 mg via INTRAVENOUS
  Filled 2022-10-29: qty 20

## 2022-10-29 NOTE — Progress Notes (Signed)
Hematology/Oncology Consult note [facility]  [phone] [phone]  Patient Care Team: Doreene Nest, NP as PCP - General (Internal Medicine) Martin Majestic, MD as PCP - Hematology/Oncology Creig Hines, MD as Consulting Physician (Hematology and Oncology)   Name of the patient: James House  [id number]  [birth year]   Date of visit: 10/29/22  Diagnosis- kappa light chain multiple myeloma in relapse   Chief complaint/ Reason for visit-on treatment assessment prior to cycle 4-day 1 of carfilzomib  Heme/Onc history: Patient is a 52- year-old male with a history of kappa light chain myeloma that was treated by [name].  History is as follows:   1.  Patient presented with renal insufficiency with a creatinine of 2.4 in April 2018.  Kidney biopsy showed myeloma cast nephropathy.  Kappa light chain was 3004 and lambda 0.22 with a kappa lambda ratio of 13,000 655.  Skeletal survey showed multiple calvarial and marrow lesions.  Bone marrow biopsy in May 2018 showed 43% plasma cells.  He received CyBorD for cycle 1 in May 2018 and was subsequently switched to RVD.  He received 4 cycles followed by a repeat bone marrow biopsy in August 2018 which showed no increase in plasma cells.   2.  He underwent autologous stem cell transplantation on 01/30/2017.  He was started on Revlimid maintenance 10 mg daily in January 2019.   3.  Labs from January February and March 2019 done at [facility] showed no M spike, normal kappa lambda ratio of 1.06.  He was last seen by them in April 2019.   4. Following that patient was admitted to [facility] for healthcare associated pneumonia and was recently discharged.  He wished to transfer his care to [place] at this time.  He was on maintenance Revlimid 5 mg which was then reduced to 2.5 mg.  Follows up with pain clinic for chemo-induced peripheral neuropathy with [name].  His urine tested positive for [facility] and therefore  patient was discharged from pain clinic   5.  Noted to have gradually increasing free kappa light chainFrom 15 in 20 21-39.5 with a ratio of 3.09.  He was seen by [facility] again and underwent a repeat bone marrow biopsy which did not show any evidence of clonal plasma cells.  PET CT scan also did not show any new active lytic lesions in May 2023.Marland Kitchen  Revlimid dose was increased to 15 mg 3 weeks on 1 week off but kappa light chains show continuing to increase   6.  PET CT scan in December 2023 did not show any evidence of active myeloma.  Patient underwent repeat bone marrow biopsy at [facility] which showed 6% plasma cellsConsistent with persistent plasma cell neoplasm.  74 to 86% of isolated plasma cells show loss of T p53 and monosomy 13.  Patient was seen by [name] at [facility] and was recommended Darzalex Pomalyst dexamethasone regimen   7.  Disease progression after 8 weekly cycles of Darazalex kappa light chain increased from 332-646 with a ratio that went up from 39-57 concerning for disease progression.  Patient switched to carfilzomib Pomalyst dexamethasone regimen  Interval history-patient reports chronic fatigue since starting carfilzomib Pomalyst regimen.  Has generalized body aches and reports exertional shortness of breath when he walks around the house.  ECOG PS- 2 Pain scale- 3 Opioid associated constipation- no  Review of systems- Review of Systems  Constitutional:  Positive for malaise/fatigue. Negative for chills, fever and weight loss.  HENT:  Negative for congestion, ear discharge and nosebleeds.   Eyes:  Negative for blurred vision.  Respiratory:  Positive for shortness of breath. Negative for cough, hemoptysis, sputum production and wheezing.   Cardiovascular:  Negative for chest pain, palpitations, orthopnea and claudication.  Gastrointestinal:  Negative for abdominal pain, blood in stool, constipation, diarrhea, heartburn, melena, nausea and vomiting.  Genitourinary:  Negative for  dysuria, flank pain, frequency, hematuria and urgency.  Musculoskeletal:  Positive for myalgias. Negative for back pain and joint pain.  Skin:  Negative for rash.  Neurological:  Negative for dizziness, tingling, focal weakness, seizures, weakness and headaches.  Endo/Heme/Allergies:  Does not bruise/bleed easily.  Psychiatric/Behavioral:  Negative for depression and suicidal ideas. The patient does not have insomnia.       No Known Allergies   Past Medical History:  Diagnosis Date   CKD (chronic kidney disease) stage 3, GFR 30-59 ml/min (HCC)    Hypertension    Multiple myeloma (HCC) 08/11/2017   Neuropathy    Pneumonia    Severe sepsis (HCC) 08/11/2017   Shingles 08/03/2019     Past Surgical History:  Procedure Laterality Date   ABDOMINAL SURGERY     COLONOSCOPY WITH PROPOFOL N/A 05/25/2021   Procedure: COLONOSCOPY WITH PROPOFOL;  Surgeon: Toney Reil, MD;  Location: [facility] ENDOSCOPY;  Service: Gastroenterology;  Laterality: N/A;   IR IMAGING GUIDED PORT INSERTION  08/02/2022   JOINT REPLACEMENT     right knee   KNEE SURGERY Left     Social History   Socioeconomic History   Marital status: Single    Spouse name: [name]   Number of children: 3   Years of education: Not on file   Highest education level: Not on file  Occupational History   Not on file  Tobacco Use   Smoking status: Former   Smokeless tobacco: Current    Types: Snuff  Vaping Use   Vaping Use: Never used  Substance and Sexual Activity   Alcohol use: No   Drug use: No   Sexual activity: Yes  Other Topics Concern   Not on file  Social History Narrative   Live in private residence with spouse and mother in law   Social Determinants of Health   Financial Resource Strain: Low Risk  (08/12/2017)   Overall Financial Resource Strain (CARDIA)    Difficulty of Paying Living Expenses: Not hard at all  Food Insecurity: No Food Insecurity (08/12/2017)   Hunger Vital Sign    Worried About Running Out  of Food in the Last Year: Never true    Ran Out of Food in the Last Year: Never true  Transportation Needs: No Transportation Needs (08/12/2017)   PRAPARE - Administrator, Civil Service (Medical): No    Lack of Transportation (Non-Medical): No  Physical Activity: Sufficiently Active (08/12/2017)   Exercise Vital Sign    Days of Exercise per Week: 7 days    Minutes of Exercise per Session: 30 min  Stress: No Stress Concern Present (08/12/2017)   [employer] of Occupational Health - Occupational Stress Questionnaire    Feeling of Stress : Only a little  Social Connections: Somewhat Isolated (08/12/2017)   Social Connection and Isolation Panel [NHANES]    Frequency of Communication with Friends and Family: Once a week    Frequency of Social Gatherings with Friends and Family: Once a week    Attends Religious Services: More than 4 times per year    Active Member  of Clubs or Organizations: No    Attends Banker Meetings: Never    Marital Status: Married  Catering manager Violence: Not At Risk (08/12/2017)   Humiliation, Afraid, Rape, and Kick questionnaire    Fear of Current or Ex-Partner: No    Emotionally Abused: No    Physically Abused: No    Sexually Abused: No    Family History  Problem Relation Age of Onset   Cancer Mother 42       Pancreatic   COPD Mother    Diabetes Mother    Hyperlipidemia Mother    Hypertension Mother    Cancer Maternal Uncle        pancreatic   Cancer Maternal Grandmother 85       colon   COPD Maternal Grandmother    Diabetes Maternal Grandmother    Hypertension Maternal Grandmother      Current Outpatient Medications:    acyclovir (ZOVIRAX) 400 MG tablet, Take 1 tablet (400 mg total) by mouth 2 (two) times daily., Disp: 60 tablet, Rfl: 2   albuterol (VENTOLIN HFA) 108 (90 Base) MCG/ACT inhaler, Inhale 2 puffs into the lungs every 6 (six) hours as needed for wheezing or shortness of breath., Disp: 6.7 g, Rfl: 2    amitriptyline (ELAVIL) 10 MG tablet, Take 1 tablet (10 mg total) by mouth at bedtime., Disp: 30 tablet, Rfl: 1   amoxicillin-clavulanate (AUGMENTIN) 875-125 MG tablet, Take 1 tablet by mouth 2 (two) times daily., Disp: 14 tablet, Rfl: 0   aspirin EC 81 MG tablet, Take by mouth., Disp: , Rfl:    baclofen (LIORESAL) 10 MG tablet, Take 1 tablet (10 mg total) by mouth 4 (four) times daily as needed for muscle spasms., Disp: 360 tablet, Rfl: 0   celecoxib (CELEBREX) 200 MG capsule, Take 1 capsule (200 mg total) by mouth daily., Disp: 30 capsule, Rfl: 2   cyanocobalamin (VITAMIN B12) 1000 MCG/ML injection, Inject 1 mL (1,000 mcg total) into the muscle every 30 (thirty) days., Disp: 1 mL, Rfl: 11   dexamethasone (DECADRON) 4 MG tablet, Take 5 tablets (20 mg total) by mouth once a week. Take first dose 1 hour prior to reporting to clinic for daratumumab. Take 2nd dose the day after daratumumab., Disp: 20 tablet, Rfl: 2   DULoxetine (CYMBALTA) 60 MG capsule, Take 1 capsule (60 mg total) by mouth daily., Disp: 60 capsule, Rfl: 1   levothyroxine (SYNTHROID) 50 MCG tablet, Take 1 tablet (50 mcg total) by mouth daily before breakfast., Disp: 30 tablet, Rfl: 2   lidocaine-prilocaine (EMLA) cream, Apply 1 Application topically as needed., Disp: 30 g, Rfl: 0   montelukast (SINGULAIR) 10 MG tablet, Take 1 tablet in the evening 1 day before your treatment. Day of treatment take 1 tablet in the morning for the first 2 treatments of Daratumumab, Disp: 4 tablet, Rfl: 0   OLANZapine (ZYPREXA) 10 MG tablet, Take 1 tablet (10 mg total) by mouth at bedtime. To help with nausea prevention., Disp: 30 tablet, Rfl: 1   OLANZapine (ZYPREXA) 10 MG tablet, Take by mouth., Disp: , Rfl:    oxyCODONE ER (XTAMPZA ER) 9 MG C12A, Take 9 mg by mouth every 12 (twelve) hours., Disp: 60 capsule, Rfl: 0   Oxycodone HCl 10 MG TABS, Take 1 tablet (10 mg total) by mouth every 8 (eight) hours as needed., Disp: 90 tablet, Rfl: 0   pomalidomide  (POMALYST) 2 MG capsule, Take 1 capsule (2 mg total) by mouth daily. Take 1 tablet  daily for 21 days and 7 days off , Rems [id number] obtained 5/6, Disp: 21 capsule, Rfl: 0   pomalidomide (POMALYST) 2 MG capsule, Take 1 capsule (2 mg total) by mouth daily. Siri Cole #[id number]    Date Obtained 6/11/024, Disp: 21 capsule, Rfl: 0   potassium chloride SA (KLOR-CON M) 20 MEQ tablet, Take 2 tablets (40 mEq total) by mouth 2 (two) times daily., Disp: 120 tablet, Rfl: 0   pregabalin (LYRICA) 300 MG capsule, Take 1 capsule (300 mg total) by mouth 2 (two) times daily., Disp: 60 capsule, Rfl: 3   prochlorperazine (COMPAZINE) 10 MG tablet, Take by mouth., Disp: , Rfl:  No current facility-administered medications for this visit.  Facility-Administered Medications Ordered in Other Visits:    0.9 %  sodium chloride infusion, , Intravenous, Once, Creig Hines, MD  Physical exam:  Vitals:   10/29/22 0817  BP: (!) 131/91  Pulse: 90  Temp: (!) 97.5 F (36.4 C)  TempSrc: Tympanic  SpO2: 100%  Weight: 152 lb 14.4 oz (69.4 kg)  Height: 5\' 7"  (1.702 m)   Physical Exam Cardiovascular:     Rate and Rhythm: Normal rate and regular rhythm.     Heart sounds: Normal heart sounds.  Pulmonary:     Effort: Pulmonary effort is normal.     Breath sounds: Normal breath sounds.  Skin:    General: Skin is warm and dry.  Neurological:     Mental Status: He is alert and oriented to person, place, and time.         Latest Ref Rng & Units 10/29/2022    8:27 AM  CMP  Glucose 70 - 99 mg/dL 147   BUN 6 - 20 mg/dL 14   Creatinine 8.29 - 1.24 mg/dL 5.62   Sodium 130 - 865 mmol/L 140   Potassium 3.5 - 5.1 mmol/L 3.2   Chloride 98 - 111 mmol/L 108   CO2 22 - 32 mmol/L 21   Calcium 8.9 - 10.3 mg/dL 9.0   Total Protein 6.5 - 8.1 g/dL 6.3   Total Bilirubin 0.3 - 1.2 mg/dL 0.6   Alkaline Phos 38 - 126 U/L 101   AST 15 - 41 U/L 29   ALT 0 - 44 U/L 17       Latest Ref Rng & Units 10/29/2022    8:27 AM  CBC  WBC  4.0 - 10.5 K/uL 5.2   Hemoglobin 13.0 - 17.0 g/dL 78.4   Hematocrit 69.6 - 52.0 % 40.0   Platelets 150 - 400 K/uL 155     No images are attached to the encounter.  ECHOCARDIOGRAM COMPLETE  Result Date: 10/04/2022    ECHOCARDIOGRAM REPORT   Patient Name:   James House. Date of Exam: 10/04/2022 Medical Rec #:  [id number]        Height:       67.0 in Accession #:    [id number]       Weight:       145.0 lb Date of Birth:  November 23, 1970         BSA:          1.764 m Patient Age:    51 years         BP:           116/80 mmHg Patient Gender: M                HR:  91 bpm. Exam Location:  [facility] Procedure: 2D Echo, Cardiac Doppler, Color Doppler and Strain Analysis Indications:     Chemo  History:         Patient has prior history of Echocardiogram examinations, most                  recent 08/24/2017. Risk Factors:Hypertension. CKD.  Sonographer:     Mikki Harbor Referring Phys:  [id number] Pete Glatter Raihan Kimmel Diagnosing Phys: Julien Nordmann MD  Sonographer Comments: Global longitudinal strain was attempted. IMPRESSIONS  1. Left ventricular ejection fraction, by estimation, is 55 to 60%. Left ventricular ejection fraction by PLAX is 58 %. The left ventricle has normal function. The left ventricle has no regional wall motion abnormalities. Left ventricular diastolic parameters are consistent with Grade I diastolic dysfunction (impaired relaxation). The average left ventricular global longitudinal strain is -11.7 %.  2. Right ventricular systolic function is normal. The right ventricular size is normal. There is normal pulmonary artery systolic pressure.  3. The mitral valve is normal in structure. Mild mitral valve regurgitation. No evidence of mitral stenosis.  4. The aortic valve is normal in structure. Aortic valve regurgitation is not visualized. No aortic stenosis is present.  5. The inferior vena cava is normal in size with greater than 50% respiratory variability, suggesting right atrial pressure of 3 mmHg.  FINDINGS  Left Ventricle: Left ventricular ejection fraction, by estimation, is 55 to 60%. Left ventricular ejection fraction by PLAX is 58 %. The left ventricle has normal function. The left ventricle has no regional wall motion abnormalities. The average left ventricular global longitudinal strain is -11.7 %. The left ventricular internal cavity size was normal in size. There is no left ventricular hypertrophy. Left ventricular diastolic parameters are consistent with Grade I diastolic dysfunction (impaired relaxation). Right Ventricle: The right ventricular size is normal. No increase in right ventricular wall thickness. Right ventricular systolic function is normal. There is normal pulmonary artery systolic pressure. The tricuspid regurgitant velocity is 2.20 m/s, and  with an assumed right atrial pressure of 3 mmHg, the estimated right ventricular systolic pressure is 22.4 mmHg. Left Atrium: Left atrial size was normal in size. Right Atrium: Right atrial size was normal in size. Pericardium: There is no evidence of pericardial effusion. Mitral Valve: The mitral valve is normal in structure. Mild mitral valve regurgitation. No evidence of mitral valve stenosis. MV peak gradient, 2.0 mmHg. The mean mitral valve gradient is 1.0 mmHg. Tricuspid Valve: The tricuspid valve is normal in structure. Tricuspid valve regurgitation is mild . No evidence of tricuspid stenosis. Aortic Valve: The aortic valve is normal in structure. Aortic valve regurgitation is not visualized. No aortic stenosis is present. Aortic valve mean gradient measures 3.0 mmHg. Aortic valve peak gradient measures 6.2 mmHg. Aortic valve area, by VTI measures 2.63 cm. Pulmonic Valve: The pulmonic valve was normal in structure. Pulmonic valve regurgitation is not visualized. No evidence of pulmonic stenosis. Aorta: The aortic root is normal in size and structure. Venous: The inferior vena cava is normal in size with greater than 50% respiratory  variability, suggesting right atrial pressure of 3 mmHg. IAS/Shunts: No atrial level shunt detected by color flow Doppler.  LEFT VENTRICLE PLAX 2D LV EF:         Left            Diastology                ventricular     LV e' medial:  7.29 cm/s                ejection        LV E/e' medial:  5.8                fraction by     LV e' lateral:   9.03 cm/s                PLAX is 58      LV E/e' lateral: 4.7                %. LVIDd:         4.60 cm         2D LVIDs:         3.20 cm         Longitudinal LV PW:         1.00 cm         Strain LV IVS:        1.00 cm         2D Strain GLS  -11.7 % LVOT diam:     2.00 cm         Avg: LV SV:         53 LV SV Index:   30 LVOT Area:     3.14 cm  LV Volumes (MOD) LV vol d, MOD    40.7 ml A2C: LV vol d, MOD    67.8 ml A4C: LV vol s, MOD    19.7 ml A2C: LV vol s, MOD    24.4 ml A4C: LV SV MOD A2C:   21.0 ml LV SV MOD A4C:   67.8 ml LV SV MOD BP:    31.2 ml RIGHT VENTRICLE RV Basal diam:  3.15 cm RV Mid diam:    2.80 cm RV S prime:     12.60 cm/s TAPSE (M-mode): 2.1 cm LEFT ATRIUM             Index        RIGHT ATRIUM           Index LA diam:        3.40 cm 1.93 cm/m   RA Area:     11.70 cm LA Vol (A2C):   31.2 ml 17.69 ml/m  RA Volume:   24.70 ml  14.00 ml/m LA Vol (A4C):   25.0 ml 14.17 ml/m LA Biplane Vol: 28.6 ml 16.21 ml/m  AORTIC VALVE                    PULMONIC VALVE AV Area (Vmax):    2.36 cm     PV Vmax:       1.45 m/s AV Area (Vmean):   2.53 cm     PV Peak grad:  8.4 mmHg AV Area (VTI):     2.63 cm AV Vmax:           125.00 cm/s AV Vmean:          78.000 cm/s AV VTI:            0.203 m AV Peak Grad:      6.2 mmHg AV Mean Grad:      3.0 mmHg LVOT Vmax:         94.10 cm/s LVOT Vmean:        62.800 cm/s LVOT VTI:          0.170 m LVOT/AV VTI ratio: 0.84  AORTA Ao Root diam: 3.50  cm MITRAL VALVE               TRICUSPID VALVE MV Area (PHT): 4.12 cm    TR Peak grad:   19.4 mmHg MV Area VTI:   3.14 cm    TR Vmax:        220.00 cm/s MV Peak grad:  2.0 mmHg MV Mean  grad:  1.0 mmHg    SHUNTS MV Vmax:       0.70 m/s    Systemic VTI:  0.17 m MV Vmean:      39.0 cm/s   Systemic Diam: 2.00 cm MV Decel Time: 184 msec MV E velocity: 42.60 cm/s MV A velocity: 54.80 cm/s MV E/A ratio:  0.78 Julien Nordmann MD Electronically signed by Julien Nordmann MD Signature Date/Time: 10/04/2022/4:53:24 PM    Final      Assessment and plan- Patient is a 52 y.o. male with history of kappa light chain multiple myeloma currently in relapse.  He is here for on treatment assessment prior to cycle 4-day 1 of carfilzomib  Multiple myeloma: Patient is getting weekly carfilzomib at a reduced dose of 20 mg/m due to poor tolerance.  He is also receiving reduced dose of Pomalyst at 2 mg 2 weeks on and 1 week off which she will continue.  Continue aspirin and acyclovir prophylaxis.  Myeloma panel and serum free light chains are pending from today.  Overall he has responded to carfilzomib Pomalyst dexamethasone regimen well.  He has an appointment at [facility] next week  Exertional shortness of breath: No clear etiology.  Echocardiogram was normal he was also treated with antibiotics for possible pneumonia.  Suspect symptoms secondary to ongoing treatments.  He will directly proceed for cycle 3-day 8 of treatment next week and I will see him back in 2 weeks for cycle 3-day 15  Hypokalemia: He will receive IV potassium today and continue oral potassium   Visit Diagnosis 1. Multiple myeloma in relapse (HCC)   2. High risk medication use   3. Encounter for antineoplastic chemotherapy   4. Hypokalemia      Dr. Owens Shark, MD, MPH [facility] [phone] 10/29/2022 12:31 PM

## 2022-10-30 LAB — KAPPA/LAMBDA LIGHT CHAINS
Kappa free light chain: 21.2 mg/L — ABNORMAL HIGH (ref 3.3–19.4)
Kappa, lambda light chain ratio: 2.99 — ABNORMAL HIGH (ref 0.26–1.65)
Lambda free light chains: 7.1 mg/L (ref 5.7–26.3)

## 2022-11-04 ENCOUNTER — Other Ambulatory Visit: Payer: Self-pay | Admitting: Oncology

## 2022-11-04 LAB — MULTIPLE MYELOMA PANEL, SERUM
Albumin SerPl Elph-Mcnc: 3.1 g/dL (ref 2.9–4.4)
Albumin/Glob SerPl: 1.7 (ref 0.7–1.7)
Alpha 1: 0.2 g/dL (ref 0.0–0.4)
Alpha2 Glob SerPl Elph-Mcnc: 0.5 g/dL (ref 0.4–1.0)
B-Globulin SerPl Elph-Mcnc: 0.7 g/dL (ref 0.7–1.3)
Gamma Glob SerPl Elph-Mcnc: 0.5 g/dL (ref 0.4–1.8)
Globulin, Total: 1.9 g/dL — ABNORMAL LOW (ref 2.2–3.9)
IgA: 70 mg/dL — ABNORMAL LOW (ref 90–386)
IgG (Immunoglobin G), Serum: 554 mg/dL — ABNORMAL LOW (ref 603–1613)
IgM (Immunoglobulin M), Srm: 35 mg/dL (ref 20–172)
Total Protein ELP: 5 g/dL — ABNORMAL LOW (ref 6.0–8.5)

## 2022-11-04 MED FILL — Dexamethasone Sodium Phosphate Inj 100 MG/10ML: INTRAMUSCULAR | Qty: 2 | Status: AC

## 2022-11-05 ENCOUNTER — Inpatient Hospital Stay: Payer: BC Managed Care – PPO

## 2022-11-05 ENCOUNTER — Other Ambulatory Visit: Payer: Self-pay | Admitting: *Deleted

## 2022-11-05 VITALS — BP 131/88 | HR 72 | Temp 98.1°F | Resp 20 | Wt 154.8 lb

## 2022-11-05 DIAGNOSIS — Z79899 Other long term (current) drug therapy: Secondary | ICD-10-CM | POA: Diagnosis not present

## 2022-11-05 DIAGNOSIS — C9002 Multiple myeloma in relapse: Secondary | ICD-10-CM

## 2022-11-05 DIAGNOSIS — Z5112 Encounter for antineoplastic immunotherapy: Secondary | ICD-10-CM | POA: Diagnosis not present

## 2022-11-05 LAB — CMP (CANCER CENTER ONLY)
ALT: 14 U/L (ref 0–44)
AST: 20 U/L (ref 15–41)
Albumin: 3.7 g/dL (ref 3.5–5.0)
Alkaline Phosphatase: 93 U/L (ref 38–126)
Anion gap: 8 (ref 5–15)
BUN: 22 mg/dL — ABNORMAL HIGH (ref 6–20)
CO2: 23 mmol/L (ref 22–32)
Calcium: 8.7 mg/dL — ABNORMAL LOW (ref 8.9–10.3)
Chloride: 106 mmol/L (ref 98–111)
Creatinine: 1.4 mg/dL — ABNORMAL HIGH (ref 0.61–1.24)
GFR, Estimated: >60 mL/min (ref >60)
Glucose, Bld: 132 mg/dL — ABNORMAL HIGH (ref 70–99)
Potassium: 3.5 mmol/L (ref 3.5–5.1)
Sodium: 137 mmol/L (ref 135–145)
Total Bilirubin: 0.6 mg/dL (ref 0.3–1.2)
Total Protein: 6.1 g/dL — ABNORMAL LOW (ref 6.5–8.1)

## 2022-11-05 LAB — CBC WITH DIFFERENTIAL (CANCER CENTER ONLY)
Abs Immature Granulocytes: 0.01 10*3/uL (ref 0.00–0.07)
Basophils Absolute: 0 10*3/uL (ref 0.0–0.1)
Basophils Relative: 1 %
Eosinophils Absolute: 0.3 10*3/uL (ref 0.0–0.5)
Eosinophils Relative: 8 %
HCT: 40.2 % (ref 39.0–52.0)
Hemoglobin: 13.6 g/dL (ref 13.0–17.0)
Immature Granulocytes: 0 %
Lymphocytes Relative: 28 %
Lymphs Abs: 1.2 10*3/uL (ref 0.7–4.0)
MCH: 32.2 pg (ref 26.0–34.0)
MCHC: 33.8 g/dL (ref 30.0–36.0)
MCV: 95.3 fL (ref 80.0–100.0)
Monocytes Absolute: 0.4 10*3/uL (ref 0.1–1.0)
Monocytes Relative: 8 %
Neutro Abs: 2.4 10*3/uL (ref 1.7–7.7)
Neutrophils Relative %: 55 %
Platelet Count: 171 10*3/uL (ref 150–400)
RBC: 4.22 MIL/uL (ref 4.22–5.81)
RDW: 15.1 % (ref 11.5–15.5)
WBC Count: 4.4 10*3/uL (ref 4.0–10.5)
nRBC: 0 % (ref 0.0–0.2)

## 2022-11-05 MED ORDER — DEXTROSE 5 % IV SOLN
20.0000 mg/m2 | Freq: Once | INTRAVENOUS | Status: AC
  Administered 2022-11-05: 36 mg via INTRAVENOUS
  Filled 2022-11-05: qty 15

## 2022-11-05 MED ORDER — SODIUM CHLORIDE 0.9 % IV SOLN
Freq: Once | INTRAVENOUS | Status: AC
  Filled 2022-11-05: qty 250

## 2022-11-05 MED ORDER — HEPARIN SOD (PORK) LOCK FLUSH 100 UNIT/ML IV SOLN
500.0000 [IU] | Freq: Once | INTRAVENOUS | Status: AC | PRN
  Administered 2022-11-05: 500 [IU]
  Filled 2022-11-05: qty 5

## 2022-11-05 MED ORDER — SODIUM CHLORIDE 0.9% FLUSH
10.0000 mL | INTRAVENOUS | Status: DC | PRN
  Administered 2022-11-05: 10 mL
  Filled 2022-11-05: qty 10

## 2022-11-05 MED ORDER — SODIUM CHLORIDE 0.9 % IV SOLN
20.0000 mg | Freq: Once | INTRAVENOUS | Status: AC
  Administered 2022-11-05: 20 mg via INTRAVENOUS
  Filled 2022-11-05: qty 20

## 2022-11-05 NOTE — Patient Instructions (Signed)
[facility]  Discharge Instructions: Thank you for choosing [facility] to provide your oncology and hematology care.  If you have a lab appointment with the Cancer Center, please go directly to the Cancer Center and check in at the registration area.  Wear comfortable clothing and clothing appropriate for easy access to any Portacath or PICC line.   We strive to give you quality time with your provider. You may need to reschedule your appointment if you arrive late (15 or more minutes).  Arriving late affects you and other patients whose appointments are after yours.  Also, if you miss three or more appointments without notifying the office, you may be dismissed from the clinic at the provider's discretion.      For prescription refill requests, have your pharmacy contact our office and allow 72 hours for refills to be completed.    Today you received the following chemotherapy and/or immunotherapy agents carfilzomib      To help prevent nausea and vomiting after your treatment, we encourage you to take your nausea medication as directed.  BELOW ARE SYMPTOMS THAT SHOULD BE REPORTED IMMEDIATELY: *FEVER GREATER THAN 100.4 F (38 C) OR HIGHER *CHILLS OR SWEATING *NAUSEA AND VOMITING THAT IS NOT CONTROLLED WITH YOUR NAUSEA MEDICATION *UNUSUAL SHORTNESS OF BREATH *UNUSUAL BRUISING OR BLEEDING *URINARY PROBLEMS (pain or burning when urinating, or frequent urination) *BOWEL PROBLEMS (unusual diarrhea, constipation, pain near the anus) TENDERNESS IN MOUTH AND THROAT WITH OR WITHOUT PRESENCE OF ULCERS (sore throat, sores in mouth, or a toothache) UNUSUAL RASH, SWELLING OR PAIN  UNUSUAL VAGINAL DISCHARGE OR ITCHING   Items with * indicate a potential emergency and should be followed up as soon as possible or go to the Emergency Department if any problems should occur.  Please show the CHEMOTHERAPY ALERT CARD or IMMUNOTHERAPY ALERT CARD at check-in  to the Emergency Department and triage nurse.  Should you have questions after your visit or need to cancel or reschedule your appointment, please contact [facility]  [phone] and follow the prompts.  Office hours are 8:00 a.m. to 4:30 p.m. Monday - Friday. Please note that voicemails left after 4:00 p.m. may not be returned until the following business day.  We are closed weekends and major holidays. You have access to a nurse at all times for urgent questions. Please call the main number to the clinic [phone] and follow the prompts.  For any non-urgent questions, you may also contact your provider using MyChart. We now offer e-Visits for anyone 23 and older to request care online for non-urgent symptoms. For details visit [email or web address].   Also download the MyChart app! Go to the app store, search "MyChart", open the app, select , and log in with your MyChart username and password.

## 2022-11-06 ENCOUNTER — Other Ambulatory Visit: Payer: Self-pay | Admitting: *Deleted

## 2022-11-06 ENCOUNTER — Encounter: Payer: Self-pay | Admitting: Oncology

## 2022-11-06 MED ORDER — POMALIDOMIDE 2 MG PO CAPS: 2.0000 mg | ORAL_CAPSULE | Freq: Every day | ORAL | 0 refills | Status: DC

## 2022-11-07 ENCOUNTER — Inpatient Hospital Stay: Payer: BC Managed Care – PPO | Admitting: Hospice and Palliative Medicine

## 2022-11-07 ENCOUNTER — Other Ambulatory Visit: Payer: Self-pay | Admitting: *Deleted

## 2022-11-07 DIAGNOSIS — C9002 Multiple myeloma in relapse: Secondary | ICD-10-CM

## 2022-11-07 MED ORDER — POMALIDOMIDE 2 MG PO CAPS: 2.0000 mg | ORAL_CAPSULE | Freq: Every day | ORAL | 0 refills | Status: DC

## 2022-11-07 NOTE — Progress Notes (Signed)
VM left. Will reschedule. 

## 2022-11-11 ENCOUNTER — Other Ambulatory Visit: Payer: Self-pay

## 2022-11-11 ENCOUNTER — Other Ambulatory Visit: Payer: Self-pay | Admitting: Oncology

## 2022-11-11 ENCOUNTER — Telehealth: Payer: BC Managed Care – PPO | Admitting: Hospice and Palliative Medicine

## 2022-11-11 ENCOUNTER — Other Ambulatory Visit (HOSPITAL_COMMUNITY): Payer: Self-pay

## 2022-11-11 ENCOUNTER — Encounter: Payer: Self-pay | Admitting: Oncology

## 2022-11-11 MED FILL — Potassium Chloride Microencapsulated Crys ER Tab 20 mEq: ORAL | 30 days supply | Qty: 120 | Fill #0 | Status: CN

## 2022-11-12 ENCOUNTER — Other Ambulatory Visit: Payer: Self-pay

## 2022-11-12 ENCOUNTER — Encounter: Payer: Self-pay | Admitting: Oncology

## 2022-11-12 ENCOUNTER — Inpatient Hospital Stay: Payer: BC Managed Care – PPO

## 2022-11-12 ENCOUNTER — Inpatient Hospital Stay (HOSPITAL_BASED_OUTPATIENT_CLINIC_OR_DEPARTMENT_OTHER): Payer: BC Managed Care – PPO | Admitting: Oncology

## 2022-11-12 VITALS — BP 143/94 | HR 82 | Temp 98.4°F | Resp 18 | Ht 67.0 in | Wt 155.6 lb

## 2022-11-12 DIAGNOSIS — Z5111 Encounter for antineoplastic chemotherapy: Secondary | ICD-10-CM | POA: Diagnosis not present

## 2022-11-12 DIAGNOSIS — C9002 Multiple myeloma in relapse: Secondary | ICD-10-CM

## 2022-11-12 DIAGNOSIS — Z79899 Other long term (current) drug therapy: Secondary | ICD-10-CM | POA: Diagnosis not present

## 2022-11-12 DIAGNOSIS — Z5112 Encounter for antineoplastic immunotherapy: Secondary | ICD-10-CM | POA: Diagnosis not present

## 2022-11-12 LAB — CBC WITH DIFFERENTIAL (CANCER CENTER ONLY)
Abs Immature Granulocytes: 0.02 10*3/uL (ref 0.00–0.07)
Basophils Absolute: 0 10*3/uL (ref 0.0–0.1)
Basophils Relative: 1 %
Eosinophils Absolute: 0.3 10*3/uL (ref 0.0–0.5)
Eosinophils Relative: 5 %
HCT: 42.9 % (ref 39.0–52.0)
Hemoglobin: 14.4 g/dL (ref 13.0–17.0)
Immature Granulocytes: 0 %
Lymphocytes Relative: 25 %
Lymphs Abs: 1.5 10*3/uL (ref 0.7–4.0)
MCH: 32 pg (ref 26.0–34.0)
MCHC: 33.6 g/dL (ref 30.0–36.0)
MCV: 95.3 fL (ref 80.0–100.0)
Monocytes Absolute: 0.3 10*3/uL (ref 0.1–1.0)
Monocytes Relative: 5 %
Neutro Abs: 3.9 10*3/uL (ref 1.7–7.7)
Neutrophils Relative %: 64 %
Platelet Count: 193 10*3/uL (ref 150–400)
RBC: 4.5 MIL/uL (ref 4.22–5.81)
RDW: 14.6 % (ref 11.5–15.5)
WBC Count: 6 10*3/uL (ref 4.0–10.5)
nRBC: 0 % (ref 0.0–0.2)

## 2022-11-12 LAB — CMP (CANCER CENTER ONLY)
ALT: 23 U/L (ref 0–44)
AST: 29 U/L (ref 15–41)
Albumin: 3.8 g/dL (ref 3.5–5.0)
Alkaline Phosphatase: 86 U/L (ref 38–126)
Anion gap: 8 (ref 5–15)
BUN: 16 mg/dL (ref 6–20)
CO2: 23 mmol/L (ref 22–32)
Calcium: 9.2 mg/dL (ref 8.9–10.3)
Chloride: 107 mmol/L (ref 98–111)
Creatinine: 1.34 mg/dL — ABNORMAL HIGH (ref 0.61–1.24)
GFR, Estimated: >60 mL/min (ref >60)
Glucose, Bld: 173 mg/dL — ABNORMAL HIGH (ref 70–99)
Potassium: 3.3 mmol/L — ABNORMAL LOW (ref 3.5–5.1)
Sodium: 138 mmol/L (ref 135–145)
Total Bilirubin: 1 mg/dL (ref 0.3–1.2)
Total Protein: 6.3 g/dL — ABNORMAL LOW (ref 6.5–8.1)

## 2022-11-12 MED ORDER — HEPARIN SOD (PORK) LOCK FLUSH 100 UNIT/ML IV SOLN
500.0000 [IU] | Freq: Once | INTRAVENOUS | Status: AC | PRN
  Administered 2022-11-12: 500 [IU]
  Filled 2022-11-12: qty 5

## 2022-11-12 MED ORDER — DEXTROSE 5 % IV SOLN
20.0000 mg/m2 | Freq: Once | INTRAVENOUS | Status: AC
  Administered 2022-11-12: 36 mg via INTRAVENOUS
  Filled 2022-11-12: qty 13

## 2022-11-12 MED ORDER — SODIUM CHLORIDE 0.9% FLUSH
10.0000 mL | INTRAVENOUS | Status: DC | PRN
  Administered 2022-11-12: 10 mL via INTRAVENOUS
  Filled 2022-11-12: qty 10

## 2022-11-12 MED ORDER — SODIUM CHLORIDE 0.9 % IV SOLN
Freq: Once | INTRAVENOUS | Status: AC
  Filled 2022-11-12: qty 250

## 2022-11-12 MED ORDER — OXYCODONE HCL 10 MG PO TABS
10.0000 mg | ORAL_TABLET | Freq: Three times a day (TID) | ORAL | 0 refills | Status: DC | PRN
  Filled 2022-11-12: qty 90, 30d supply, fill #0

## 2022-11-12 MED ORDER — POTASSIUM CHLORIDE CRYS ER 20 MEQ PO TBCR
40.0000 meq | EXTENDED_RELEASE_TABLET | Freq: Two times a day (BID) | ORAL | 0 refills | Status: DC
  Filled 2022-11-12: qty 120, 30d supply, fill #0

## 2022-11-12 MED ORDER — SODIUM CHLORIDE 0.9 % IV SOLN
20.0000 mg | Freq: Once | INTRAVENOUS | Status: AC
  Administered 2022-11-12: 20 mg via INTRAVENOUS
  Filled 2022-11-12: qty 2

## 2022-11-12 NOTE — Patient Instructions (Signed)

## 2022-11-12 NOTE — Progress Notes (Signed)
Hematology/Oncology Consult note [facility]  [phone] [phone]  Patient Care Team: Doreene Nest, NP as PCP - General (Internal Medicine) Martin Majestic, MD as PCP - Hematology/Oncology Creig Hines, MD as Consulting Physician (Hematology and Oncology)   Name of the patient: James House  [id number]  [birth year]   Date of visit: 11/12/22  Diagnosis- kappa light chain multiple myeloma in relapse   Chief complaint/ Reason for visit- on treatment assessment prior to cycle 4 day 15 of carfilzomib  Heme/Onc history: Patient is a 52- year-old male with a history of kappa light chain myeloma that was treated by [name].  History is as follows:   1.  Patient presented with renal insufficiency with a creatinine of 2.4 in April 2018.  Kidney biopsy showed myeloma cast nephropathy.  Kappa light chain was 3004 and lambda 0.22 with a kappa lambda ratio of 13,000 655.  Skeletal survey showed multiple calvarial and marrow lesions.  Bone marrow biopsy in May 2018 showed 43% plasma cells.  He received CyBorD for cycle 1 in May 2018 and was subsequently switched to RVD.  He received 4 cycles followed by a repeat bone marrow biopsy in August 2018 which showed no increase in plasma cells.   2.  He underwent autologous stem cell transplantation on 01/30/2017.  He was started on Revlimid maintenance 10 mg daily in January 2019.   3.  Labs from January February and March 2019 done at [facility] showed no M spike, normal kappa lambda ratio of 1.06.  He was last seen by them in April 2019.   4. Following that patient was admitted to [facility] for healthcare associated pneumonia and was recently discharged.  He wished to transfer his care to [place] at this time.  He was on maintenance Revlimid 5 mg which was then reduced to 2.5 mg.  Follows up with pain clinic for chemo-induced peripheral neuropathy with [name].  His urine tested positive for [facility] and therefore  patient was discharged from pain clinic   5.  Noted to have gradually increasing free kappa light chainFrom 15 in 20 21-39.5 with a ratio of 3.09.  He was seen by [facility] again and underwent a repeat bone marrow biopsy which did not show any evidence of clonal plasma cells.  PET CT scan also did not show any new active lytic lesions in May 2023.Marland Kitchen  Revlimid dose was increased to 15 mg 3 weeks on 1 week off but kappa light chains show continuing to increase   6.  PET CT scan in December 2023 did not show any evidence of active myeloma.  Patient underwent repeat bone marrow biopsy at [facility] which showed 6% plasma cellsConsistent with persistent plasma cell neoplasm.  74 to 86% of isolated plasma cells show loss of T p53 and monosomy 13.  Patient was seen by [name] at [facility] and was recommended Darzalex Pomalyst dexamethasone regimen   7.  Disease progression after 8 weekly cycles of Darazalex kappa light chain increased from 332-646 with a ratio that went up from 39-57 concerning for disease progression.  Patient switched to carfilzomib Pomalyst dexamethasone regimen    Interval history-tolerating treatments well so far.  He has baseline myalgias and bodyaches as well as symptoms of peripheral neuropathy which have remained stable overall.  Denies any new complaints at this time  ECOG PS- 1 Pain scale- 4 Opioid associated constipation- no  Review of systems- Review of Systems  Constitutional:  Positive  for malaise/fatigue. Negative for chills, fever and weight loss.  HENT:  Negative for congestion, ear discharge and nosebleeds.   Eyes:  Negative for blurred vision.  Respiratory:  Negative for cough, hemoptysis, sputum production, shortness of breath and wheezing.   Cardiovascular:  Negative for chest pain, palpitations, orthopnea and claudication.  Gastrointestinal:  Negative for abdominal pain, blood in stool, constipation, diarrhea, heartburn, melena, nausea and vomiting.  Genitourinary:  Negative for  dysuria, flank pain, frequency, hematuria and urgency.  Musculoskeletal:  Positive for back pain and myalgias. Negative for joint pain.  Skin:  Negative for rash.  Neurological:  Positive for sensory change (Peripheral neuropathy). Negative for dizziness, tingling, focal weakness, seizures, weakness and headaches.  Endo/Heme/Allergies:  Does not bruise/bleed easily.  Psychiatric/Behavioral:  Negative for depression and suicidal ideas. The patient does not have insomnia.       No Known Allergies   Past Medical History:  Diagnosis Date   CKD (chronic kidney disease) stage 3, GFR 30-59 ml/min (HCC)    Hypertension    Multiple myeloma (HCC) 08/11/2017   Neuropathy    Pneumonia    Severe sepsis (HCC) 08/11/2017   Shingles 08/03/2019     Past Surgical History:  Procedure Laterality Date   ABDOMINAL SURGERY     COLONOSCOPY WITH PROPOFOL N/A 05/25/2021   Procedure: COLONOSCOPY WITH PROPOFOL;  Surgeon: Toney Reil, MD;  Location: [facility] ENDOSCOPY;  Service: Gastroenterology;  Laterality: N/A;   IR IMAGING GUIDED PORT INSERTION  08/02/2022   JOINT REPLACEMENT     right knee   KNEE SURGERY Left     Social History   Socioeconomic History   Marital status: Single    Spouse name: [name]   Number of children: 3   Years of education: Not on file   Highest education level: Not on file  Occupational History   Not on file  Tobacco Use   Smoking status: Former   Smokeless tobacco: Current    Types: Snuff  Vaping Use   Vaping status: Never Used  Substance and Sexual Activity   Alcohol use: No   Drug use: No   Sexual activity: Yes  Other Topics Concern   Not on file  Social History Narrative   Live in private residence with spouse and mother in law   Social Determinants of Health   Financial Resource Strain: Low Risk  (08/12/2017)   Overall Financial Resource Strain (CARDIA)    Difficulty of Paying Living Expenses: Not hard at all  Food Insecurity: No Food Insecurity  (08/12/2017)   Hunger Vital Sign    Worried About Running Out of Food in the Last Year: Never true    Ran Out of Food in the Last Year: Never true  Transportation Needs: No Transportation Needs (08/12/2017)   PRAPARE - Administrator, Civil Service (Medical): No    Lack of Transportation (Non-Medical): No  Physical Activity: Sufficiently Active (08/12/2017)   Exercise Vital Sign    Days of Exercise per Week: 7 days    Minutes of Exercise per Session: 30 min  Stress: No Stress Concern Present (08/12/2017)   [employer] of Occupational Health - Occupational Stress Questionnaire    Feeling of Stress : Only a little  Social Connections: Somewhat Isolated (08/12/2017)   Social Connection and Isolation Panel [NHANES]    Frequency of Communication with Friends and Family: Once a week    Frequency of Social Gatherings with Friends and Family: Once a week  Attends Religious Services: More than 4 times per year    Active Member of Clubs or Organizations: No    Attends Banker Meetings: Never    Marital Status: Married  Catering manager Violence: Not At Risk (08/12/2017)   Humiliation, Afraid, Rape, and Kick questionnaire    Fear of Current or Ex-Partner: No    Emotionally Abused: No    Physically Abused: No    Sexually Abused: No    Family History  Problem Relation Age of Onset   Cancer Mother 88       Pancreatic   COPD Mother    Diabetes Mother    Hyperlipidemia Mother    Hypertension Mother    Cancer Maternal Uncle        pancreatic   Cancer Maternal Grandmother 19       colon   COPD Maternal Grandmother    Diabetes Maternal Grandmother    Hypertension Maternal Grandmother      Current Outpatient Medications:    acyclovir (ZOVIRAX) 400 MG tablet, Take 1 tablet (400 mg total) by mouth 2 (two) times daily., Disp: 60 tablet, Rfl: 2   albuterol (VENTOLIN HFA) 108 (90 Base) MCG/ACT inhaler, Inhale 2 puffs into the lungs every 6 (six) hours as needed for  wheezing or shortness of breath., Disp: 6.7 g, Rfl: 2   amitriptyline (ELAVIL) 10 MG tablet, Take 1 tablet (10 mg total) by mouth at bedtime., Disp: 30 tablet, Rfl: 1   amoxicillin-clavulanate (AUGMENTIN) 875-125 MG tablet, Take 1 tablet by mouth 2 (two) times daily., Disp: 14 tablet, Rfl: 0   aspirin EC 81 MG tablet, Take by mouth., Disp: , Rfl:    baclofen (LIORESAL) 10 MG tablet, Take 1 tablet (10 mg total) by mouth 4 (four) times daily as needed for muscle spasms., Disp: 360 tablet, Rfl: 0   celecoxib (CELEBREX) 200 MG capsule, Take 1 capsule (200 mg total) by mouth daily., Disp: 30 capsule, Rfl: 2   cyanocobalamin (VITAMIN B12) 1000 MCG/ML injection, Inject 1 mL (1,000 mcg total) into the muscle every 30 (thirty) days., Disp: 1 mL, Rfl: 11   dexamethasone (DECADRON) 4 MG tablet, Take 5 tablets (20 mg total) by mouth once a week. Take first dose 1 hour prior to reporting to clinic for daratumumab. Take 2nd dose the day after daratumumab., Disp: 20 tablet, Rfl: 2   DULoxetine (CYMBALTA) 60 MG capsule, Take 1 capsule (60 mg total) by mouth daily., Disp: 60 capsule, Rfl: 1   levothyroxine (SYNTHROID) 50 MCG tablet, Take 1 tablet (50 mcg total) by mouth daily before breakfast., Disp: 30 tablet, Rfl: 2   lidocaine-prilocaine (EMLA) cream, Apply 1 Application topically as needed., Disp: 30 g, Rfl: 0   montelukast (SINGULAIR) 10 MG tablet, Take 1 tablet in the evening 1 day before your treatment. Day of treatment take 1 tablet in the morning for the first 2 treatments of Daratumumab, Disp: 4 tablet, Rfl: 0   OLANZapine (ZYPREXA) 10 MG tablet, Take 1 tablet (10 mg total) by mouth at bedtime. To help with nausea prevention., Disp: 30 tablet, Rfl: 1   OLANZapine (ZYPREXA) 10 MG tablet, Take by mouth., Disp: , Rfl:    oxyCODONE ER (XTAMPZA ER) 9 MG C12A, Take 9 mg by mouth every 12 (twelve) hours., Disp: 60 capsule, Rfl: 0   pomalidomide (POMALYST) 2 MG capsule, Take 1 capsule (2 mg total) by mouth daily.  Take for 21 days and then 7 days off, Disp: 21 capsule, Rfl: 0  pregabalin (LYRICA) 300 MG capsule, Take 1 capsule (300 mg total) by mouth 2 (two) times daily., Disp: 60 capsule, Rfl: 3   prochlorperazine (COMPAZINE) 10 MG tablet, Take by mouth., Disp: , Rfl:    Oxycodone HCl 10 MG TABS, Take 1 tablet (10 mg total) by mouth every 8 (eight) hours as needed., Disp: 90 tablet, Rfl: 0   potassium chloride SA (KLOR-CON M) 20 MEQ tablet, Take 2 tablets (40 mEq total) by mouth 2 (two) times daily., Disp: 120 tablet, Rfl: 0 No current facility-administered medications for this visit.  Facility-Administered Medications Ordered in Other Visits:    heparin lock flush 100 unit/mL, 500 Units, Intracatheter, Once PRN, Creig Hines, MD   sodium chloride flush (NS) 0.9 % injection 10 mL, 10 mL, Intravenous, PRN, Creig Hines, MD, 10 mL at 11/12/22 0816  Physical exam:  Vitals:   11/12/22 0828  BP: (!) 143/94  Pulse: 82  Resp: 18  Temp: 98.4 F (36.9 C)  TempSrc: Tympanic  SpO2: 100%  Weight: 155 lb 9.6 oz (70.6 kg)  Height: 5\' 7"  (1.702 m)   Physical Exam Cardiovascular:     Rate and Rhythm: Normal rate and regular rhythm.     Heart sounds: Normal heart sounds.  Pulmonary:     Effort: Pulmonary effort is normal.     Breath sounds: Normal breath sounds.  Skin:    General: Skin is warm and dry.  Neurological:     Mental Status: He is alert and oriented to person, place, and time.         Latest Ref Rng & Units 11/12/2022    8:16 AM  CMP  Glucose 70 - 99 mg/dL 086   BUN 6 - 20 mg/dL 16   Creatinine 5.78 - 1.24 mg/dL 4.69   Sodium 629 - 528 mmol/L 138   Potassium 3.5 - 5.1 mmol/L 3.3   Chloride 98 - 111 mmol/L 107   CO2 22 - 32 mmol/L 23   Calcium 8.9 - 10.3 mg/dL 9.2   Total Protein 6.5 - 8.1 g/dL 6.3   Total Bilirubin 0.3 - 1.2 mg/dL 1.0   Alkaline Phos 38 - 126 U/L 86   AST 15 - 41 U/L 29   ALT 0 - 44 U/L 23       Latest Ref Rng & Units 11/12/2022    8:16 AM  CBC  WBC  4.0 - 10.5 K/uL 6.0   Hemoglobin 13.0 - 17.0 g/dL 41.3   Hematocrit 24.4 - 52.0 % 42.9   Platelets 150 - 400 K/uL 193      Assessment and plan- Patient is a 52 y.o. male with history of kappa light chain multiple myeloma currently in relapse.  He is here for on treatment assessment prior to cycle 4-day 15 of carfilzomib.   Overall patient is responded very well to Pomalyst carfilzomib regimen.  Myeloma panel shows no M protein and serum free kappa light chain is trending down.  Counts otherwise okay to proceed with cycle 4-day 15 of carfilzomib today.  He will continue with Pomalyst 2 mg 3 weeks on and 1 week off.  He will directly proceed for cycle 5-day 1 of carfilzomib in 2 weeks and I will see him back in 3 weeks for cycle 5-day 8.  Hypokalemia: Continue oral potassium  Chemo-induced peripheral neuropathy: Continue as needed oxycodone   Visit Diagnosis 1. Multiple myeloma in relapse (HCC)   2. Encounter for antineoplastic chemotherapy   3. High risk  medication use      Dr. Owens Shark, MD, MPH [facility] [phone] 11/12/2022 12:03 PM

## 2022-11-13 ENCOUNTER — Other Ambulatory Visit: Payer: Self-pay | Admitting: *Deleted

## 2022-11-13 ENCOUNTER — Other Ambulatory Visit: Payer: Self-pay

## 2022-11-13 MED ORDER — AMITRIPTYLINE HCL 10 MG PO TABS
10.0000 mg | ORAL_TABLET | Freq: Every day | ORAL | 1 refills | Status: DC
  Filled 2022-11-13: qty 30, 30d supply, fill #0
  Filled 2022-12-10: qty 30, 30d supply, fill #1

## 2022-11-25 MED FILL — Dexamethasone Sodium Phosphate Inj 100 MG/10ML: INTRAMUSCULAR | Qty: 2 | Status: AC

## 2022-11-26 ENCOUNTER — Inpatient Hospital Stay: Payer: BC Managed Care – PPO

## 2022-11-26 ENCOUNTER — Inpatient Hospital Stay: Payer: BC Managed Care – PPO | Attending: Internal Medicine

## 2022-11-26 ENCOUNTER — Ambulatory Visit: Payer: BC Managed Care – PPO | Admitting: Oncology

## 2022-11-26 VITALS — BP 152/92 | HR 72 | Temp 97.7°F | Resp 18 | Ht 67.0 in | Wt 160.3 lb

## 2022-11-26 DIAGNOSIS — C9002 Multiple myeloma in relapse: Secondary | ICD-10-CM | POA: Diagnosis present

## 2022-11-26 DIAGNOSIS — Z79899 Other long term (current) drug therapy: Secondary | ICD-10-CM | POA: Diagnosis not present

## 2022-11-26 DIAGNOSIS — Z5112 Encounter for antineoplastic immunotherapy: Secondary | ICD-10-CM | POA: Insufficient documentation

## 2022-11-26 LAB — CMP (CANCER CENTER ONLY)
ALT: 18 U/L (ref 0–44)
AST: 19 U/L (ref 15–41)
Albumin: 3.7 g/dL (ref 3.5–5.0)
Alkaline Phosphatase: 77 U/L (ref 38–126)
Anion gap: 6 (ref 5–15)
BUN: 9 mg/dL (ref 6–20)
CO2: 25 mmol/L (ref 22–32)
Calcium: 8.9 mg/dL (ref 8.9–10.3)
Chloride: 105 mmol/L (ref 98–111)
Creatinine: 1.34 mg/dL — ABNORMAL HIGH (ref 0.61–1.24)
GFR, Estimated: >60 mL/min (ref >60)
Glucose, Bld: 121 mg/dL — ABNORMAL HIGH (ref 70–99)
Potassium: 3.3 mmol/L — ABNORMAL LOW (ref 3.5–5.1)
Sodium: 136 mmol/L (ref 135–145)
Total Bilirubin: 0.9 mg/dL (ref 0.3–1.2)
Total Protein: 6.1 g/dL — ABNORMAL LOW (ref 6.5–8.1)

## 2022-11-26 LAB — CBC WITH DIFFERENTIAL (CANCER CENTER ONLY)
Abs Immature Granulocytes: 0.01 10*3/uL (ref 0.00–0.07)
Basophils Absolute: 0 10*3/uL (ref 0.0–0.1)
Basophils Relative: 1 %
Eosinophils Absolute: 0.2 10*3/uL (ref 0.0–0.5)
Eosinophils Relative: 4 %
HCT: 40.2 % (ref 39.0–52.0)
Hemoglobin: 14 g/dL (ref 13.0–17.0)
Immature Granulocytes: 0 %
Lymphocytes Relative: 28 %
Lymphs Abs: 1.1 10*3/uL (ref 0.7–4.0)
MCH: 32.5 pg (ref 26.0–34.0)
MCHC: 34.8 g/dL (ref 30.0–36.0)
MCV: 93.3 fL (ref 80.0–100.0)
Monocytes Absolute: 0.4 10*3/uL (ref 0.1–1.0)
Monocytes Relative: 9 %
Neutro Abs: 2.4 10*3/uL (ref 1.7–7.7)
Neutrophils Relative %: 58 %
Platelet Count: 151 10*3/uL (ref 150–400)
RBC: 4.31 MIL/uL (ref 4.22–5.81)
RDW: 14 % (ref 11.5–15.5)
WBC Count: 4.1 10*3/uL (ref 4.0–10.5)
nRBC: 0 % (ref 0.0–0.2)

## 2022-11-26 MED ORDER — HEPARIN SOD (PORK) LOCK FLUSH 100 UNIT/ML IV SOLN
500.0000 [IU] | Freq: Once | INTRAVENOUS | Status: AC | PRN
  Administered 2022-11-26: 500 [IU]
  Filled 2022-11-26: qty 5

## 2022-11-26 MED ORDER — DEXTROSE 5 % IV SOLN
20.0000 mg/m2 | Freq: Once | INTRAVENOUS | Status: AC
  Administered 2022-11-26: 36 mg via INTRAVENOUS
  Filled 2022-11-26: qty 15

## 2022-11-26 MED ORDER — SODIUM CHLORIDE 0.9 % IV SOLN
Freq: Once | INTRAVENOUS | Status: AC
  Filled 2022-11-26: qty 250

## 2022-11-26 MED ORDER — SODIUM CHLORIDE 0.9 % IV SOLN
20.0000 mg | Freq: Once | INTRAVENOUS | Status: AC
  Administered 2022-11-26: 20 mg via INTRAVENOUS
  Filled 2022-11-26: qty 20

## 2022-11-26 MED ORDER — SODIUM CHLORIDE 0.9% FLUSH
10.0000 mL | INTRAVENOUS | Status: DC | PRN
  Administered 2022-11-26: 10 mL
  Filled 2022-11-26: qty 10

## 2022-11-26 NOTE — Patient Instructions (Signed)
[facility]  Discharge Instructions: Thank you for choosing [facility] to provide your oncology and hematology care.  If you have a lab appointment with the Cancer Center, please go directly to the Cancer Center and check in at the registration area.  Wear comfortable clothing and clothing appropriate for easy access to any Portacath or PICC line.   We strive to give you quality time with your provider. You may need to reschedule your appointment if you arrive late (15 or more minutes).  Arriving late affects you and other patients whose appointments are after yours.  Also, if you miss three or more appointments without notifying the office, you may be dismissed from the clinic at the provider's discretion.      For prescription refill requests, have your pharmacy contact our office and allow 72 hours for refills to be completed.    Today you received the following chemotherapy and/or immunotherapy agents carfilzomib      To help prevent nausea and vomiting after your treatment, we encourage you to take your nausea medication as directed.  BELOW ARE SYMPTOMS THAT SHOULD BE REPORTED IMMEDIATELY: *FEVER GREATER THAN 100.4 F (38 C) OR HIGHER *CHILLS OR SWEATING *NAUSEA AND VOMITING THAT IS NOT CONTROLLED WITH YOUR NAUSEA MEDICATION *UNUSUAL SHORTNESS OF BREATH *UNUSUAL BRUISING OR BLEEDING *URINARY PROBLEMS (pain or burning when urinating, or frequent urination) *BOWEL PROBLEMS (unusual diarrhea, constipation, pain near the anus) TENDERNESS IN MOUTH AND THROAT WITH OR WITHOUT PRESENCE OF ULCERS (sore throat, sores in mouth, or a toothache) UNUSUAL RASH, SWELLING OR PAIN  UNUSUAL VAGINAL DISCHARGE OR ITCHING   Items with * indicate a potential emergency and should be followed up as soon as possible or go to the Emergency Department if any problems should occur.  Please show the CHEMOTHERAPY ALERT CARD or IMMUNOTHERAPY ALERT CARD at check-in  to the Emergency Department and triage nurse.  Should you have questions after your visit or need to cancel or reschedule your appointment, please contact [facility]  [phone] and follow the prompts.  Office hours are 8:00 a.m. to 4:30 p.m. Monday - Friday. Please note that voicemails left after 4:00 p.m. may not be returned until the following business day.  We are closed weekends and major holidays. You have access to a nurse at all times for urgent questions. Please call the main number to the clinic [phone] and follow the prompts.  For any non-urgent questions, you may also contact your provider using MyChart. We now offer e-Visits for anyone 23 and older to request care online for non-urgent symptoms. For details visit [email or web address].   Also download the MyChart app! Go to the app store, search "MyChart", open the app, select , and log in with your MyChart username and password.

## 2022-11-28 ENCOUNTER — Other Ambulatory Visit: Payer: Self-pay

## 2022-11-28 ENCOUNTER — Ambulatory Visit (INDEPENDENT_AMBULATORY_CARE_PROVIDER_SITE_OTHER): Payer: BC Managed Care – PPO | Admitting: Primary Care

## 2022-11-28 ENCOUNTER — Encounter: Payer: Self-pay | Admitting: Primary Care

## 2022-11-28 VITALS — BP 156/94 | HR 88 | Temp 98.0°F | Ht 67.0 in | Wt 162.0 lb

## 2022-11-28 DIAGNOSIS — G620 Drug-induced polyneuropathy: Secondary | ICD-10-CM

## 2022-11-28 DIAGNOSIS — E039 Hypothyroidism, unspecified: Secondary | ICD-10-CM

## 2022-11-28 DIAGNOSIS — Z125 Encounter for screening for malignant neoplasm of prostate: Secondary | ICD-10-CM | POA: Diagnosis not present

## 2022-11-28 DIAGNOSIS — Z0001 Encounter for general adult medical examination with abnormal findings: Secondary | ICD-10-CM

## 2022-11-28 DIAGNOSIS — R519 Headache, unspecified: Secondary | ICD-10-CM

## 2022-11-28 DIAGNOSIS — G894 Chronic pain syndrome: Secondary | ICD-10-CM

## 2022-11-28 DIAGNOSIS — C9002 Multiple myeloma in relapse: Secondary | ICD-10-CM

## 2022-11-28 DIAGNOSIS — I10 Essential (primary) hypertension: Secondary | ICD-10-CM

## 2022-11-28 DIAGNOSIS — R739 Hyperglycemia, unspecified: Secondary | ICD-10-CM | POA: Diagnosis not present

## 2022-11-28 DIAGNOSIS — G43009 Migraine without aura, not intractable, without status migrainosus: Secondary | ICD-10-CM

## 2022-11-28 DIAGNOSIS — F411 Generalized anxiety disorder: Secondary | ICD-10-CM

## 2022-11-28 DIAGNOSIS — T451X5A Adverse effect of antineoplastic and immunosuppressive drugs, initial encounter: Secondary | ICD-10-CM

## 2022-11-28 MED ORDER — AMLODIPINE BESY-BENAZEPRIL HCL 10-20 MG PO CAPS
1.0000 | ORAL_CAPSULE | Freq: Every day | ORAL | 0 refills | Status: DC
Start: 2022-11-28 — End: 2022-12-25
  Filled 2022-11-28: qty 90, 90d supply, fill #0

## 2022-11-28 NOTE — Assessment & Plan Note (Signed)
Controlled.  No concerns today.  Remain off treatment at this time.

## 2022-11-28 NOTE — Assessment & Plan Note (Signed)
Following with oncology.  Continue Lyrica 300 mg twice daily. No longer on Cymbalta per patient request.

## 2022-11-28 NOTE — Patient Instructions (Signed)
I changed your blood pressure medicine around.  Start amlodipine-benazepril blood pressure pill.  Take 1 tablet by mouth every day for blood pressure.  Increase your amitriptyline to 20 mg at bedtime for headache prevention and sleep.  Ask the oncology lab to draw all the labs I have pended.  Please schedule a follow up visit to meet back with me in 2-3 weeks for blood pressure check and for headaches.   It was a pleasure to see you today!

## 2022-11-28 NOTE — Progress Notes (Signed)
Subjective:    Patient ID: James House., male    DOB: 26-Sep-1970, 52 y.o.   MRN: [id number]  HPI  James House. is a very pleasant 52 y.o. male who presents today for complete physical and follow up of chronic conditions.  Immunizations: -Tetanus: Completed in 2021 -Shingles: Completed Shingrix series -Pneumonia: Completed Prevnar 13 in 2020  Diet: Fair diet.  Exercise: No regular exercise.  Eye exam: Completes annually  Dental exam: Completes semi-annually    Colonoscopy: Completed in 2023, due 2033  PSA: Due   BP Readings from Last 3 Encounters:  11/28/22 (!) 156/94  11/26/22 (!) 152/92  11/12/22 (!) 143/94    He is compliant to his amlodipine 10 mg daily. He is checking BP at home which mostly runs 160's/90's. He has noticed a return in his headaches since April 2024 when resuming chemotherapy. He ran out of Topamax in June 2024, he tried to get it refilled but was told "it was discontinued". He's not aware of this. He is managed on amlodipine 10 mg HS for sleep which does not help.     Review of Systems  Constitutional:  Negative for unexpected weight change.  HENT:  Negative for rhinorrhea.   Respiratory:  Negative for cough and shortness of breath.   Cardiovascular:  Negative for chest pain.  Gastrointestinal:  Negative for constipation and diarrhea.  Genitourinary:  Negative for difficulty urinating.  Musculoskeletal:  Positive for arthralgias and myalgias.  Skin:  Negative for rash.  Allergic/Immunologic: Negative for environmental allergies.  Neurological:  Positive for headaches. Negative for dizziness.  Psychiatric/Behavioral:  The patient is not nervous/anxious.          Past Medical History:  Diagnosis Date   CKD (chronic kidney disease) stage 3, GFR 30-59 ml/min (HCC)    Elevated sedimentation rate 12/24/2017   Hypertension    Multiple myeloma (HCC) 08/11/2017   Neuropathy    Pneumonia    Severe sepsis (HCC) 08/11/2017   Shingles  08/03/2019    Social History   Socioeconomic History   Marital status: Single    Spouse name: James House   Number of children: 3   Years of education: Not on file   Highest education level: Not on file  Occupational History   Not on file  Tobacco Use   Smoking status: Former   Smokeless tobacco: Current    Types: Snuff  Vaping Use   Vaping status: Never Used  Substance and Sexual Activity   Alcohol use: No   Drug use: No   Sexual activity: Yes  Other Topics Concern   Not on file  Social History Narrative   Live in private residence with spouse and mother in law   Social Determinants of Health   Financial Resource Strain: Low Risk  (08/12/2017)   Overall Financial Resource Strain (CARDIA)    Difficulty of Paying Living Expenses: Not hard at all  Food Insecurity: No Food Insecurity (08/12/2017)   Hunger Vital Sign    Worried About Running Out of Food in the Last Year: Never true    Ran Out of Food in the Last Year: Never true  Transportation Needs: No Transportation Needs (08/12/2017)   PRAPARE - Administrator, Civil Service (Medical): No    Lack of Transportation (Non-Medical): No  Physical Activity: Sufficiently Active (08/12/2017)   Exercise Vital Sign    Days of Exercise per Week: 7 days    Minutes of Exercise per Session: 30  min  Stress: No Stress Concern Present (08/12/2017)   [employer] of Occupational Health - Occupational Stress Questionnaire    Feeling of Stress : Only a little  Social Connections: Somewhat Isolated (08/12/2017)   Social Connection and Isolation Panel [NHANES]    Frequency of Communication with Friends and Family: Once a week    Frequency of Social Gatherings with Friends and Family: Once a week    Attends Religious Services: More than 4 times per year    Active Member of [employer] or Organizations: No    Attends Banker Meetings: Never    Marital Status: Married  Catering manager Violence: Not At Risk (08/12/2017)    Humiliation, Afraid, Rape, and Kick questionnaire    Fear of Current or Ex-Partner: No    Emotionally Abused: No    Physically Abused: No    Sexually Abused: No    Past Surgical History:  Procedure Laterality Date   ABDOMINAL SURGERY     COLONOSCOPY WITH PROPOFOL N/A 05/25/2021   Procedure: COLONOSCOPY WITH PROPOFOL;  Surgeon: Toney Reil, MD;  Location: [facility] ENDOSCOPY;  Service: Gastroenterology;  Laterality: N/A;   IR IMAGING GUIDED PORT INSERTION  08/02/2022   JOINT REPLACEMENT     right knee   KNEE SURGERY Left     Family History  Problem Relation Age of Onset   Cancer Mother 12       Pancreatic   COPD Mother    Diabetes Mother    Hyperlipidemia Mother    Hypertension Mother    Cancer Maternal Uncle        pancreatic   Cancer Maternal Grandmother 71       colon   COPD Maternal Grandmother    Diabetes Maternal Grandmother    Hypertension Maternal Grandmother     No Known Allergies  Current Outpatient Medications on File Prior to Visit  Medication Sig Dispense Refill   acyclovir (ZOVIRAX) 400 MG tablet Take 1 tablet (400 mg total) by mouth 2 (two) times daily. 60 tablet 2   albuterol (VENTOLIN HFA) 108 (90 Base) MCG/ACT inhaler Inhale 2 puffs into the lungs every 6 (six) hours as needed for wheezing or shortness of breath. 6.7 g 2   amitriptyline (ELAVIL) 10 MG tablet Take 1 tablet (10 mg total) by mouth at bedtime. 30 tablet 1   aspirin EC 81 MG tablet Take by mouth.     baclofen (LIORESAL) 10 MG tablet Take 1 tablet (10 mg total) by mouth 4 (four) times daily as needed for muscle spasms. 360 tablet 0   celecoxib (CELEBREX) 200 MG capsule Take 1 capsule (200 mg total) by mouth daily. 30 capsule 2   cyanocobalamin (VITAMIN B12) 1000 MCG/ML injection Inject 1 mL (1,000 mcg total) into the muscle every 30 (thirty) days. 1 mL 11   dexamethasone (DECADRON) 4 MG tablet Take 5 tablets (20 mg total) by mouth once a week. Take first dose 1 hour prior to reporting to  clinic for daratumumab. Take 2nd dose the day after daratumumab. 20 tablet 2   DULoxetine (CYMBALTA) 60 MG capsule Take 1 capsule (60 mg total) by mouth daily. 60 capsule 1   levothyroxine (SYNTHROID) 50 MCG tablet Take 1 tablet (50 mcg total) by mouth daily before breakfast. 30 tablet 2   lidocaine-prilocaine (EMLA) cream Apply 1 Application topically as needed. 30 g 0   montelukast (SINGULAIR) 10 MG tablet Take 1 tablet in the evening 1 day before your treatment. Day of treatment take  1 tablet in the morning for the first 2 treatments of Daratumumab 4 tablet 0   OLANZapine (ZYPREXA) 10 MG tablet Take 1 tablet (10 mg total) by mouth at bedtime. To help with nausea prevention. 30 tablet 1   oxyCODONE ER (XTAMPZA ER) 9 MG C12A Take 9 mg by mouth every 12 (twelve) hours. 60 capsule 0   Oxycodone HCl 10 MG TABS Take 1 tablet (10 mg total) by mouth every 8 (eight) hours as needed. 90 tablet 0   pomalidomide (POMALYST) 2 MG capsule Take 1 capsule (2 mg total) by mouth daily. Take for 21 days and then 7 days off 21 capsule 0   potassium chloride SA (KLOR-CON M) 20 MEQ tablet Take 2 tablets (40 mEq total) by mouth 2 (two) times daily. 120 tablet 0   pregabalin (LYRICA) 300 MG capsule Take 1 capsule (300 mg total) by mouth 2 (two) times daily. 60 capsule 3   prochlorperazine (COMPAZINE) 10 MG tablet Take by mouth.     amoxicillin-clavulanate (AUGMENTIN) 875-125 MG tablet Take 1 tablet by mouth 2 (two) times daily. (Patient not taking: Reported on 11/28/2022) 14 tablet 0   OLANZapine (ZYPREXA) 10 MG tablet Take by mouth. (Patient not taking: Reported on 11/28/2022)     No current facility-administered medications on file prior to visit.    BP (!) 156/94   Pulse 88   Temp 98 F (36.7 C) (Temporal)   Ht 5\' 7"  (1.702 m)   Wt 162 lb (73.5 kg)   SpO2 98%   BMI 25.37 kg/m  Objective:   Physical Exam HENT:     Right Ear: Tympanic membrane and ear canal normal.     Left Ear: Tympanic membrane and ear canal  normal.     Nose: Nose normal.     Right Sinus: No maxillary sinus tenderness or frontal sinus tenderness.     Left Sinus: No maxillary sinus tenderness or frontal sinus tenderness.  Eyes:     Conjunctiva/sclera: Conjunctivae normal.  Neck:     Thyroid: No thyromegaly.     Vascular: No carotid bruit.  Cardiovascular:     Rate and Rhythm: Normal rate and regular rhythm.     Heart sounds: Normal heart sounds.  Pulmonary:     Effort: Pulmonary effort is normal.     Breath sounds: Normal breath sounds. No wheezing or rales.  Abdominal:     General: Bowel sounds are normal.     Palpations: Abdomen is soft.     Tenderness: There is no abdominal tenderness.  Musculoskeletal:        General: Normal range of motion.     Cervical back: Neck supple.  Skin:    General: Skin is warm and dry.  Neurological:     Mental Status: He is alert and oriented to person, place, and time.     Cranial Nerves: No cranial nerve deficit.     Deep Tendon Reflexes: Reflexes are normal and symmetric.  Psychiatric:        Mood and Affect: Mood normal.           Assessment & Plan:  Multiple myeloma in relapse [facility]) Assessment & Plan: Relapsed.   Reviewed oncology notes from July 2024. Continue chemotherapy weekly.   Continue Zyprexa 10 mg as needed for nausea, dexamethasone 20 mg once weekly prior to chemotherapy, oxycodone ER 9 mg every 12 hours as needed, oxycodone 10 mg every 8 hours as needed, Lyrica 300 mg twice daily, Compazine 10 mg  as needed.   Hyperglycemia -     Hemoglobin A1c; Future  Screening for prostate cancer -     PSA; Future  Hypothyroidism, unspecified type -     TSH; Future  Primary hypertension Assessment & Plan: Uncontrolled. Could be side effect from chemotherapy.  Stop amlodipine 10 mg daily. Start amlodipine-benazepril 10-20 mg daily.  Will plan to see him back in the office in 2 weeks for blood pressure check and BMP.  Orders: -     amLODIPine  Besy-Benazepril HCl; Take 1 capsule by mouth daily. for blood pressure.  Dispense: 90 capsule; Refill: 0  Migraine without aura and without status migrainosus, not intractable Assessment & Plan: Deteriorated.  It appears that a CMA from another office discontinued my prescription for topiramate 50 mg.  He has been out for months.  He is managed on amitriptyline 10 mg per oncology for sleep which has been ineffective.  Rather than reinitiate topiramate, we will increase amitriptyline to 20 mg at bedtime for both headache prevention and sleep.  Follow-up in 2 weeks.   Chemotherapy-induced peripheral neuropathy [facility]) Assessment & Plan: Following with oncology.  Continue Lyrica 300 mg twice daily. No longer on Cymbalta per patient request.   Frequent headaches Assessment & Plan: Uncontrolled.  It appears that a CMA within the health system discontinue my topiramate prescription.  He is managed on amitriptyline 10 mg at bedtime per oncology for sleep which is ineffective. Rather than reinitiate topiramate, we will increase amitriptyline to 20 mg at bedtime for headache invention and sleep.  Follow-up in 2 weeks.   GAD (generalized anxiety disorder) Assessment & Plan: Controlled.  No concerns today.  Remain off treatment at this time.   Encounter for annual general medical examination with abnormal findings in adult Assessment & Plan: Immunizations UTD. Colonoscopy UTD, due 2033 PSA due and pending.  Discussed the importance of a healthy diet and regular exercise in order for weight loss, and to reduce the risk of further co-morbidity.  Exam stable. Labs pending and reviewed.  Follow up in 1 year for repeat physical.    Chronic pain syndrome Assessment & Plan: No longer following with pain management.  Continue oxycodone ER 9 mg every 12 hours as needed, oxycodone 10 mg every 8 hours as needed, Lyrica 300 mg twice daily, baclofen 10 mg 4 times  daily.         Doreene Nest, NP

## 2022-11-28 NOTE — Assessment & Plan Note (Signed)
No longer following with pain management.  Continue oxycodone ER 9 mg every 12 hours as needed, oxycodone 10 mg every 8 hours as needed, Lyrica 300 mg twice daily, baclofen 10 mg 4 times daily.

## 2022-11-28 NOTE — Assessment & Plan Note (Signed)
Uncontrolled.  It appears that a CMA within the health system discontinue my topiramate prescription.  He is managed on amitriptyline 10 mg at bedtime per oncology for sleep which is ineffective. Rather than reinitiate topiramate, we will increase amitriptyline to 20 mg at bedtime for headache invention and sleep.  Follow-up in 2 weeks.

## 2022-11-28 NOTE — Assessment & Plan Note (Signed)
Deteriorated.  It appears that a CMA from another office discontinued my prescription for topiramate 50 mg.  He has been out for months.  He is managed on amitriptyline 10 mg per oncology for sleep which has been ineffective.  Rather than reinitiate topiramate, we will increase amitriptyline to 20 mg at bedtime for both headache prevention and sleep.  Follow-up in 2 weeks.

## 2022-11-28 NOTE — Assessment & Plan Note (Signed)
Immunizations UTD. Colonoscopy UTD, due 2033 PSA due and pending.  Discussed the importance of a healthy diet and regular exercise in order for weight loss, and to reduce the risk of further co-morbidity.  Exam stable. Labs pending and reviewed.  Follow up in 1 year for repeat physical.

## 2022-11-28 NOTE — Assessment & Plan Note (Addendum)
Uncontrolled. Could be side effect from chemotherapy.  Stop amlodipine 10 mg daily. Start amlodipine-benazepril 10-20 mg daily.  Will plan to see him back in the office in 2 weeks for blood pressure check and BMP.

## 2022-11-28 NOTE — Assessment & Plan Note (Addendum)
Relapsed.   Reviewed oncology notes from July 2024. Continue chemotherapy weekly.   Continue Zyprexa 10 mg as needed for nausea, dexamethasone 20 mg once weekly prior to chemotherapy, oxycodone ER 9 mg every 12 hours as needed, oxycodone 10 mg every 8 hours as needed, Lyrica 300 mg twice daily, Compazine 10 mg as needed.

## 2022-12-02 MED FILL — Dexamethasone Sodium Phosphate Inj 100 MG/10ML: INTRAMUSCULAR | Qty: 2 | Status: AC

## 2022-12-03 ENCOUNTER — Inpatient Hospital Stay (HOSPITAL_BASED_OUTPATIENT_CLINIC_OR_DEPARTMENT_OTHER): Payer: BC Managed Care – PPO | Admitting: Oncology

## 2022-12-03 ENCOUNTER — Ambulatory Visit: Payer: BC Managed Care – PPO

## 2022-12-03 ENCOUNTER — Inpatient Hospital Stay: Payer: BC Managed Care – PPO

## 2022-12-03 ENCOUNTER — Encounter: Payer: Self-pay | Admitting: Oncology

## 2022-12-03 ENCOUNTER — Other Ambulatory Visit: Payer: BC Managed Care – PPO

## 2022-12-03 ENCOUNTER — Other Ambulatory Visit: Payer: Self-pay | Admitting: *Deleted

## 2022-12-03 VITALS — BP 126/97 | HR 86 | Temp 96.9°F | Resp 18 | Ht 67.0 in | Wt 162.7 lb

## 2022-12-03 DIAGNOSIS — R0602 Shortness of breath: Secondary | ICD-10-CM

## 2022-12-03 DIAGNOSIS — Z9484 Stem cells transplant status: Secondary | ICD-10-CM | POA: Diagnosis not present

## 2022-12-03 DIAGNOSIS — M1990 Unspecified osteoarthritis, unspecified site: Secondary | ICD-10-CM | POA: Diagnosis not present

## 2022-12-03 DIAGNOSIS — R739 Hyperglycemia, unspecified: Secondary | ICD-10-CM

## 2022-12-03 DIAGNOSIS — Z79899 Other long term (current) drug therapy: Secondary | ICD-10-CM | POA: Diagnosis not present

## 2022-12-03 DIAGNOSIS — F17290 Nicotine dependence, other tobacco product, uncomplicated: Secondary | ICD-10-CM | POA: Diagnosis not present

## 2022-12-03 DIAGNOSIS — G620 Drug-induced polyneuropathy: Secondary | ICD-10-CM

## 2022-12-03 DIAGNOSIS — G629 Polyneuropathy, unspecified: Secondary | ICD-10-CM | POA: Diagnosis not present

## 2022-12-03 DIAGNOSIS — Z5111 Encounter for antineoplastic chemotherapy: Secondary | ICD-10-CM

## 2022-12-03 DIAGNOSIS — R5383 Other fatigue: Secondary | ICD-10-CM

## 2022-12-03 DIAGNOSIS — C9002 Multiple myeloma in relapse: Secondary | ICD-10-CM

## 2022-12-03 DIAGNOSIS — T451X5A Adverse effect of antineoplastic and immunosuppressive drugs, initial encounter: Secondary | ICD-10-CM

## 2022-12-03 DIAGNOSIS — I10 Essential (primary) hypertension: Secondary | ICD-10-CM | POA: Diagnosis not present

## 2022-12-03 DIAGNOSIS — Z5112 Encounter for antineoplastic immunotherapy: Secondary | ICD-10-CM | POA: Diagnosis not present

## 2022-12-03 DIAGNOSIS — N289 Disorder of kidney and ureter, unspecified: Secondary | ICD-10-CM | POA: Diagnosis not present

## 2022-12-03 DIAGNOSIS — K279 Peptic ulcer, site unspecified, unspecified as acute or chronic, without hemorrhage or perforation: Secondary | ICD-10-CM | POA: Diagnosis not present

## 2022-12-03 LAB — CMP (CANCER CENTER ONLY)
ALT: 16 U/L (ref 0–44)
AST: 18 U/L (ref 15–41)
Albumin: 3.8 g/dL (ref 3.5–5.0)
Alkaline Phosphatase: 88 U/L (ref 38–126)
Anion gap: 8 (ref 5–15)
BUN: 11 mg/dL (ref 6–20)
CO2: 25 mmol/L (ref 22–32)
Calcium: 8.7 mg/dL — ABNORMAL LOW (ref 8.9–10.3)
Chloride: 102 mmol/L (ref 98–111)
Creatinine: 1.32 mg/dL — ABNORMAL HIGH (ref 0.61–1.24)
GFR, Estimated: >60 mL/min (ref >60)
Glucose, Bld: 125 mg/dL — ABNORMAL HIGH (ref 70–99)
Potassium: 3.2 mmol/L — ABNORMAL LOW (ref 3.5–5.1)
Sodium: 135 mmol/L (ref 135–145)
Total Bilirubin: 0.6 mg/dL (ref 0.3–1.2)
Total Protein: 6.2 g/dL — ABNORMAL LOW (ref 6.5–8.1)

## 2022-12-03 LAB — TSH: TSH: 7.279 u[IU]/mL — ABNORMAL HIGH (ref 0.350–4.500)

## 2022-12-03 LAB — CBC WITH DIFFERENTIAL (CANCER CENTER ONLY)
Abs Immature Granulocytes: 0.02 10*3/uL (ref 0.00–0.07)
Basophils Absolute: 0 10*3/uL (ref 0.0–0.1)
Basophils Relative: 1 %
Eosinophils Absolute: 0.3 10*3/uL (ref 0.0–0.5)
Eosinophils Relative: 5 %
HCT: 40.6 % (ref 39.0–52.0)
Hemoglobin: 14.3 g/dL (ref 13.0–17.0)
Immature Granulocytes: 0 %
Lymphocytes Relative: 23 %
Lymphs Abs: 1.4 10*3/uL (ref 0.7–4.0)
MCH: 32.7 pg (ref 26.0–34.0)
MCHC: 35.2 g/dL (ref 30.0–36.0)
MCV: 92.9 fL (ref 80.0–100.0)
Monocytes Absolute: 0.6 10*3/uL (ref 0.1–1.0)
Monocytes Relative: 10 %
Neutro Abs: 3.8 10*3/uL (ref 1.7–7.7)
Neutrophils Relative %: 61 %
Platelet Count: 186 10*3/uL (ref 150–400)
RBC: 4.37 MIL/uL (ref 4.22–5.81)
RDW: 14 % (ref 11.5–15.5)
WBC Count: 6.1 10*3/uL (ref 4.0–10.5)
nRBC: 0 % (ref 0.0–0.2)

## 2022-12-03 LAB — PSA: Prostatic Specific Antigen: 0.18 ng/mL (ref 0.00–4.00)

## 2022-12-03 MED ORDER — SODIUM CHLORIDE 0.9 % IV SOLN
Freq: Once | INTRAVENOUS | Status: AC
  Filled 2022-12-03: qty 250

## 2022-12-03 MED ORDER — HEPARIN SOD (PORK) LOCK FLUSH 100 UNIT/ML IV SOLN
500.0000 [IU] | Freq: Once | INTRAVENOUS | Status: AC | PRN
  Filled 2022-12-03: qty 5

## 2022-12-03 MED ORDER — SODIUM CHLORIDE 0.9 % IV SOLN
20.0000 mg | Freq: Once | INTRAVENOUS | Status: AC
  Administered 2022-12-03: 20 mg via INTRAVENOUS
  Filled 2022-12-03: qty 20

## 2022-12-03 MED ORDER — HEPARIN SOD (PORK) LOCK FLUSH 100 UNIT/ML IV SOLN
INTRAVENOUS | Status: AC
  Administered 2022-12-03: 500 [IU]
  Filled 2022-12-03: qty 5

## 2022-12-03 MED ORDER — DEXTROSE 5 % IV SOLN
20.0000 mg/m2 | Freq: Once | INTRAVENOUS | Status: AC
  Administered 2022-12-03: 36 mg via INTRAVENOUS
  Filled 2022-12-03: qty 15

## 2022-12-03 NOTE — Progress Notes (Signed)
Hematology/Oncology Consult note [facility]  [phone] [phone]  Patient Care Team: Doreene Nest, NP as PCP - General (Internal Medicine) Martin Majestic, MD as PCP - Hematology/Oncology Creig Hines, MD as Consulting Physician (Hematology and Oncology)   Name of the patient: James House  [id number]  03-04-1971   Date of visit: 12/03/22  Diagnosis- kappa light chain multiple myeloma in relapse   Chief complaint/ Reason for visit-on treatment assessment prior to cycle 5-day 8 of carfilzomib  Heme/Onc history: Patient is a 52- year-old male with a history of kappa light chain myeloma that was treated by [name].  History is as follows:   1.  Patient presented with renal insufficiency with a creatinine of 2.4 in April 2018.  Kidney biopsy showed myeloma cast nephropathy.  Kappa light chain was 3004 and lambda 0.22 with a kappa lambda ratio of 13,000 655.  Skeletal survey showed multiple calvarial and marrow lesions.  Bone marrow biopsy in May 2018 showed 43% plasma cells.  He received CyBorD for cycle 1 in May 2018 and was subsequently switched to RVD.  He received 4 cycles followed by a repeat bone marrow biopsy in August 2018 which showed no increase in plasma cells.   2.  He underwent autologous stem cell transplantation on 01/30/2017.  He was started on Revlimid maintenance 10 mg daily in January 2019.   3.  Labs from January February and March 2019 done at [facility] showed no M spike, normal kappa lambda ratio of 1.06.  He was last seen by them in April 2019.   4. Following that patient was admitted to [facility] for healthcare associated pneumonia and was recently discharged.  He wished to transfer his care to [place] at this time.  He was on maintenance Revlimid 5 mg which was then reduced to 2.5 mg.  Follows up with pain clinic for chemo-induced peripheral neuropathy with [name].  His urine tested positive for [facility] and therefore  patient was discharged from pain clinic   5.  Noted to have gradually increasing free kappa light chainFrom 15 in 20 21-39.5 with a ratio of 3.09.  He was seen by [facility] again and underwent a repeat bone marrow biopsy which did not show any evidence of clonal plasma cells.  PET CT scan also did not show any new active lytic lesions in May 2023.Marland Kitchen  Revlimid dose was increased to 15 mg 3 weeks on 1 week off but kappa light chains show continuing to increase   6.  PET CT scan in December 2023 did not show any evidence of active myeloma.  Patient underwent repeat bone marrow biopsy at [facility] which showed 6% plasma cellsConsistent with persistent plasma cell neoplasm.  74 to 86% of isolated plasma cells show loss of T p53 and monosomy 13.  Patient was seen by [name] at [facility] and was recommended Darzalex Pomalyst dexamethasone regimen   7.  Disease progression after 8 weekly cycles of Darazalex kappa light chain increased from 332-646 with a ratio that went up from 39-57 concerning for disease progression.  Patient switched to carfilzomib Pomalyst dexamethasone regimen    Interval history-patient reports chronic fatigue and exertional shortness of breath.  Blood pressures in the officeHave been mostly in the 140s-150's systolic.  He has been recently started on amlodipine benazepril combination by his primary care doctor.  ECOG PS- 1 Pain scale- 4 Opioid associated constipation- no  Review of systems- Review of Systems  Constitutional:  Positive for malaise/fatigue. Negative for chills, fever and weight loss.  HENT:  Negative for congestion, ear discharge and nosebleeds.   Eyes:  Negative for blurred vision.  Respiratory:  Positive for shortness of breath. Negative for cough, hemoptysis, sputum production and wheezing.   Cardiovascular:  Negative for chest pain, palpitations, orthopnea and claudication.  Gastrointestinal:  Negative for abdominal pain, blood in stool, constipation, diarrhea, heartburn,  melena, nausea and vomiting.  Genitourinary:  Negative for dysuria, flank pain, frequency, hematuria and urgency.  Musculoskeletal:  Positive for back pain and joint pain. Negative for myalgias.  Skin:  Negative for rash.  Neurological:  Positive for sensory change (Peripheral neuropathy). Negative for dizziness, tingling, focal weakness, seizures, weakness and headaches.  Endo/Heme/Allergies:  Does not bruise/bleed easily.  Psychiatric/Behavioral:  Negative for depression and suicidal ideas. The patient does not have insomnia.       No Known Allergies   Past Medical History:  Diagnosis Date   CKD (chronic kidney disease) stage 3, GFR 30-59 ml/min (HCC)    Elevated sedimentation rate 12/24/2017   Hypertension    Multiple myeloma (HCC) 08/11/2017   Neuropathy    Pneumonia    Severe sepsis (HCC) 08/11/2017   Shingles 08/03/2019     Past Surgical History:  Procedure Laterality Date   ABDOMINAL SURGERY     COLONOSCOPY WITH PROPOFOL N/A 05/25/2021   Procedure: COLONOSCOPY WITH PROPOFOL;  Surgeon: Toney Reil, MD;  Location: [facility] ENDOSCOPY;  Service: Gastroenterology;  Laterality: N/A;   IR IMAGING GUIDED PORT INSERTION  08/02/2022   JOINT REPLACEMENT     right knee   KNEE SURGERY Left     Social History   Socioeconomic History   Marital status: Single    Spouse name: [name]   Number of children: 3   Years of education: Not on file   Highest education level: Not on file  Occupational History   Not on file  Tobacco Use   Smoking status: Former   Smokeless tobacco: Current    Types: Snuff  Vaping Use   Vaping status: Never Used  Substance and Sexual Activity   Alcohol use: No   Drug use: No   Sexual activity: Yes  Other Topics Concern   Not on file  Social History Narrative   Live in private residence with spouse and mother in law   Social Determinants of Health   Financial Resource Strain: Low Risk  (08/12/2017)   Overall Financial Resource Strain  (CARDIA)    Difficulty of Paying Living Expenses: Not hard at all  Food Insecurity: No Food Insecurity (08/12/2017)   Hunger Vital Sign    Worried About Running Out of Food in the Last Year: Never true    Ran Out of Food in the Last Year: Never true  Transportation Needs: No Transportation Needs (08/12/2017)   PRAPARE - Administrator, Civil Service (Medical): No    Lack of Transportation (Non-Medical): No  Physical Activity: Sufficiently Active (08/12/2017)   Exercise Vital Sign    Days of Exercise per Week: 7 days    Minutes of Exercise per Session: 30 min  Stress: No Stress Concern Present (08/12/2017)   [employer] of Occupational Health - Occupational Stress Questionnaire    Feeling of Stress : Only a little  Social Connections: Somewhat Isolated (08/12/2017)   Social Connection and Isolation Panel [NHANES]    Frequency of Communication with Friends and Family: Once a week    Frequency of Social Gatherings with  Friends and Family: Once a week    Attends Religious Services: More than 4 times per year    Active Member of [employer] or Organizations: No    Attends Banker Meetings: Never    Marital Status: Married  Catering manager Violence: Not At Risk (08/12/2017)   Humiliation, Afraid, Rape, and Kick questionnaire    Fear of Current or Ex-Partner: No    Emotionally Abused: No    Physically Abused: No    Sexually Abused: No    Family History  Problem Relation Age of Onset   Cancer Mother 40       Pancreatic   COPD Mother    Diabetes Mother    Hyperlipidemia Mother    Hypertension Mother    Cancer Maternal Uncle        pancreatic   Cancer Maternal Grandmother 52       colon   COPD Maternal Grandmother    Diabetes Maternal Grandmother    Hypertension Maternal Grandmother      Current Outpatient Medications:    acyclovir (ZOVIRAX) 400 MG tablet, Take 1 tablet (400 mg total) by mouth 2 (two) times daily., Disp: 60 tablet, Rfl: 2   albuterol  (VENTOLIN HFA) 108 (90 Base) MCG/ACT inhaler, Inhale 2 puffs into the lungs every 6 (six) hours as needed for wheezing or shortness of breath., Disp: 6.7 g, Rfl: 2   amitriptyline (ELAVIL) 10 MG tablet, Take 1 tablet (10 mg total) by mouth at bedtime., Disp: 30 tablet, Rfl: 1   amLODipine-benazepril (LOTREL) 10-20 MG capsule, Take 1 capsule by mouth daily. for blood pressure., Disp: 90 capsule, Rfl: 0   amoxicillin-clavulanate (AUGMENTIN) 875-125 MG tablet, Take 1 tablet by mouth 2 (two) times daily. (Patient not taking: Reported on 11/28/2022), Disp: 14 tablet, Rfl: 0   aspirin EC 81 MG tablet, Take by mouth., Disp: , Rfl:    baclofen (LIORESAL) 10 MG tablet, Take 1 tablet (10 mg total) by mouth 4 (four) times daily as needed for muscle spasms., Disp: 360 tablet, Rfl: 0   celecoxib (CELEBREX) 200 MG capsule, Take 1 capsule (200 mg total) by mouth daily., Disp: 30 capsule, Rfl: 2   cyanocobalamin (VITAMIN B12) 1000 MCG/ML injection, Inject 1 mL (1,000 mcg total) into the muscle every 30 (thirty) days., Disp: 1 mL, Rfl: 11   dexamethasone (DECADRON) 4 MG tablet, Take 5 tablets (20 mg total) by mouth once a week. Take first dose 1 hour prior to reporting to clinic for daratumumab. Take 2nd dose the day after daratumumab., Disp: 20 tablet, Rfl: 2   DULoxetine (CYMBALTA) 60 MG capsule, Take 1 capsule (60 mg total) by mouth daily., Disp: 60 capsule, Rfl: 1   levothyroxine (SYNTHROID) 50 MCG tablet, Take 1 tablet (50 mcg total) by mouth daily before breakfast., Disp: 30 tablet, Rfl: 2   lidocaine-prilocaine (EMLA) cream, Apply 1 Application topically as needed., Disp: 30 g, Rfl: 0   montelukast (SINGULAIR) 10 MG tablet, Take 1 tablet in the evening 1 day before your treatment. Day of treatment take 1 tablet in the morning for the first 2 treatments of Daratumumab, Disp: 4 tablet, Rfl: 0   OLANZapine (ZYPREXA) 10 MG tablet, Take 1 tablet (10 mg total) by mouth at bedtime. To help with nausea prevention., Disp:  30 tablet, Rfl: 1   OLANZapine (ZYPREXA) 10 MG tablet, Take by mouth. (Patient not taking: Reported on 11/28/2022), Disp: , Rfl:    oxyCODONE ER (XTAMPZA ER) 9 MG C12A, Take 9 mg  by mouth every 12 (twelve) hours., Disp: 60 capsule, Rfl: 0   Oxycodone HCl 10 MG TABS, Take 1 tablet (10 mg total) by mouth every 8 (eight) hours as needed., Disp: 90 tablet, Rfl: 0   pomalidomide (POMALYST) 2 MG capsule, Take 1 capsule (2 mg total) by mouth daily. Take for 21 days and then 7 days off, Disp: 21 capsule, Rfl: 0   potassium chloride SA (KLOR-CON M) 20 MEQ tablet, Take 2 tablets (40 mEq total) by mouth 2 (two) times daily., Disp: 120 tablet, Rfl: 0   pregabalin (LYRICA) 300 MG capsule, Take 1 capsule (300 mg total) by mouth 2 (two) times daily., Disp: 60 capsule, Rfl: 3   prochlorperazine (COMPAZINE) 10 MG tablet, Take by mouth., Disp: , Rfl:  No current facility-administered medications for this visit.  Facility-Administered Medications Ordered in Other Visits:    carfilzomib (KYPROLIS) 36 mg in dextrose 5 % 50 mL chemo infusion, 20 mg/m2 (Treatment Plan Recorded), Intravenous, Once, Creig Hines, MD   heparin lock flush 100 unit/mL, 500 Units, Intracatheter, Once PRN, Creig Hines, MD  Physical exam:  Vitals:   12/03/22 1144 12/03/22 1152  BP: (!) 142/100 (!) 126/97  Pulse: 84 86  Resp: 18   Temp: (!) 96.9 F (36.1 C)   TempSrc: Tympanic   SpO2: 98%   Weight: 162 lb 11.2 oz (73.8 kg)   Height: 5\' 7"  (1.702 m)    Physical Exam Cardiovascular:     Rate and Rhythm: Normal rate and regular rhythm.     Heart sounds: Normal heart sounds.  Pulmonary:     Effort: Pulmonary effort is normal.     Breath sounds: Normal breath sounds.  Abdominal:     General: Bowel sounds are normal.     Palpations: Abdomen is soft.  Skin:    General: Skin is warm and dry.  Neurological:     Mental Status: He is alert and oriented to person, place, and time.         Latest Ref Rng & Units 12/03/2022    11:26 AM  CMP  Glucose 70 - 99 mg/dL 161   BUN 6 - 20 mg/dL 11   Creatinine 0.96 - 1.24 mg/dL 0.45   Sodium 409 - 811 mmol/L 135   Potassium 3.5 - 5.1 mmol/L 3.2   Chloride 98 - 111 mmol/L 102   CO2 22 - 32 mmol/L 25   Calcium 8.9 - 10.3 mg/dL 8.7   Total Protein 6.5 - 8.1 g/dL 6.2   Total Bilirubin 0.3 - 1.2 mg/dL 0.6   Alkaline Phos 38 - 126 U/L 88   AST 15 - 41 U/L 18   ALT 0 - 44 U/L 16       Latest Ref Rng & Units 12/03/2022   11:26 AM  CBC  WBC 4.0 - 10.5 K/uL 6.1   Hemoglobin 13.0 - 17.0 g/dL 91.4   Hematocrit 78.2 - 52.0 % 40.6   Platelets 150 - 400 K/uL 186      Assessment and plan- Patient is a 52 y.o. male with history of kappa light chain multiple myeloma currently in partial remission  Counts okay to proceed with cycle 5-day 8 of carfilzomib today.  He is receiving that at 20 mg/m 3 weeks on and 1 week off.  He will also continue Pomalyst 2 mg 3 weeks on 1 week off.  Myeloma labs have responded well and last month myeloma panel showed no M protein.  Serum free  kappa light chain is slowly trending down and his ratio is near normal.  Hypertension: Currently on amlodipine benazepril combination.  Have asked him to keep an eye on his blood pressure readings at home if we need to adjust it further.  Exertional shortness of breath: Patient had an echoCardiogram in June 2024 which was unremarkable and showed a normal EF.  I am getting a CT chest without contrast and referring him to cardiology as well  Chemo-induced peripheral neuropathy: Continue as needed oxycodone.  Overall symptoms are stable  Chronic hypokalemia: Continue oral potassium.  Patient has not been able to keep up with his daily dose of 40 mEq   Visit Diagnosis 1. Multiple myeloma in relapse (HCC)   2. Blood glucose elevated   3. Other fatigue      Dr. Owens Shark, MD, MPH [facility] [phone] 12/03/2022 1:10 PM

## 2022-12-03 NOTE — Patient Instructions (Signed)
[facility]  Discharge Instructions: Thank you for choosing [facility] to provide your oncology and hematology care.  If you have a lab appointment with the Cancer Center, please go directly to the Cancer Center and check in at the registration area.  Wear comfortable clothing and clothing appropriate for easy access to any Portacath or PICC line.   We strive to give you quality time with your provider. You may need to reschedule your appointment if you arrive late (15 or more minutes).  Arriving late affects you and other patients whose appointments are after yours.  Also, if you miss three or more appointments without notifying the office, you may be dismissed from the clinic at the provider's discretion.      For prescription refill requests, have your pharmacy contact our office and allow 72 hours for refills to be completed.    Today you received the following chemotherapy and/or immunotherapy agents- kyprolis      To help prevent nausea and vomiting after your treatment, we encourage you to take your nausea medication as directed.  BELOW ARE SYMPTOMS THAT SHOULD BE REPORTED IMMEDIATELY: *FEVER GREATER THAN 100.4 F (38 C) OR HIGHER *CHILLS OR SWEATING *NAUSEA AND VOMITING THAT IS NOT CONTROLLED WITH YOUR NAUSEA MEDICATION *UNUSUAL SHORTNESS OF BREATH *UNUSUAL BRUISING OR BLEEDING *URINARY PROBLEMS (pain or burning when urinating, or frequent urination) *BOWEL PROBLEMS (unusual diarrhea, constipation, pain near the anus) TENDERNESS IN MOUTH AND THROAT WITH OR WITHOUT PRESENCE OF ULCERS (sore throat, sores in mouth, or a toothache) UNUSUAL RASH, SWELLING OR PAIN  UNUSUAL VAGINAL DISCHARGE OR ITCHING   Items with * indicate a potential emergency and should be followed up as soon as possible or go to the Emergency Department if any problems should occur.  Please show the CHEMOTHERAPY ALERT CARD or IMMUNOTHERAPY ALERT CARD at check-in to  the Emergency Department and triage nurse.  Should you have questions after your visit or need to cancel or reschedule your appointment, please contact [facility] [facility]  [phone] and follow the prompts.  Office hours are 8:00 a.m. to 4:30 p.m. Monday - Friday. Please note that voicemails left after 4:00 p.m. may not be returned until the following business day.  We are closed weekends and major holidays. You have access to a nurse at all times for urgent questions. Please call the main number to the clinic [phone] and follow the prompts.  For any non-urgent questions, you may also contact your provider using MyChart. We now offer e-Visits for anyone 18 and older to request care online for non-urgent symptoms. For details visit [email or web address].   Also download the MyChart app! Go to the app store, search "MyChart", open the app, select [facility], and log in with your MyChart username and password.    

## 2022-12-05 ENCOUNTER — Encounter (INDEPENDENT_AMBULATORY_CARE_PROVIDER_SITE_OTHER): Payer: Self-pay

## 2022-12-05 ENCOUNTER — Other Ambulatory Visit: Payer: Self-pay | Admitting: Oncology

## 2022-12-05 DIAGNOSIS — C9002 Multiple myeloma in relapse: Secondary | ICD-10-CM

## 2022-12-06 ENCOUNTER — Telehealth: Payer: Self-pay | Admitting: *Deleted

## 2022-12-06 ENCOUNTER — Other Ambulatory Visit: Payer: Self-pay | Admitting: *Deleted

## 2022-12-06 NOTE — Telephone Encounter (Signed)
Pt needs a letter for him because he still cannot  to go to work. I faxed the letter and sent it today.

## 2022-12-09 DIAGNOSIS — R519 Headache, unspecified: Secondary | ICD-10-CM

## 2022-12-09 MED FILL — Dexamethasone Sodium Phosphate Inj 100 MG/10ML: INTRAMUSCULAR | Qty: 2 | Status: AC

## 2022-12-10 ENCOUNTER — Other Ambulatory Visit: Payer: BC Managed Care – PPO

## 2022-12-10 ENCOUNTER — Inpatient Hospital Stay: Payer: BC Managed Care – PPO

## 2022-12-10 ENCOUNTER — Ambulatory Visit: Payer: BC Managed Care – PPO | Admitting: Oncology

## 2022-12-10 ENCOUNTER — Other Ambulatory Visit: Payer: Self-pay

## 2022-12-10 VITALS — BP 159/99 | HR 79 | Temp 97.8°F | Wt 161.8 lb

## 2022-12-10 DIAGNOSIS — C9002 Multiple myeloma in relapse: Secondary | ICD-10-CM

## 2022-12-10 DIAGNOSIS — Z79899 Other long term (current) drug therapy: Secondary | ICD-10-CM | POA: Diagnosis not present

## 2022-12-10 DIAGNOSIS — Z5112 Encounter for antineoplastic immunotherapy: Secondary | ICD-10-CM | POA: Diagnosis not present

## 2022-12-10 LAB — CMP (CANCER CENTER ONLY)
ALT: 24 U/L (ref 0–44)
AST: 25 U/L (ref 15–41)
Albumin: 3.7 g/dL (ref 3.5–5.0)
Alkaline Phosphatase: 86 U/L (ref 38–126)
Anion gap: 8 (ref 5–15)
BUN: 17 mg/dL (ref 6–20)
CO2: 21 mmol/L — ABNORMAL LOW (ref 22–32)
Calcium: 8.7 mg/dL — ABNORMAL LOW (ref 8.9–10.3)
Chloride: 106 mmol/L (ref 98–111)
Creatinine: 1.34 mg/dL — ABNORMAL HIGH (ref 0.61–1.24)
GFR, Estimated: >60 mL/min (ref >60)
Glucose, Bld: 144 mg/dL — ABNORMAL HIGH (ref 70–99)
Potassium: 3.7 mmol/L (ref 3.5–5.1)
Sodium: 135 mmol/L (ref 135–145)
Total Bilirubin: 0.6 mg/dL (ref 0.3–1.2)
Total Protein: 6.2 g/dL — ABNORMAL LOW (ref 6.5–8.1)

## 2022-12-10 LAB — CBC WITH DIFFERENTIAL (CANCER CENTER ONLY)
Abs Immature Granulocytes: 0.06 10*3/uL (ref 0.00–0.07)
Basophils Absolute: 0 10*3/uL (ref 0.0–0.1)
Basophils Relative: 1 %
Eosinophils Absolute: 0.3 10*3/uL (ref 0.0–0.5)
Eosinophils Relative: 4 %
HCT: 43.5 % (ref 39.0–52.0)
Hemoglobin: 14.9 g/dL (ref 13.0–17.0)
Immature Granulocytes: 1 %
Lymphocytes Relative: 19 %
Lymphs Abs: 1.2 10*3/uL (ref 0.7–4.0)
MCH: 32.5 pg (ref 26.0–34.0)
MCHC: 34.3 g/dL (ref 30.0–36.0)
MCV: 94.8 fL (ref 80.0–100.0)
Monocytes Absolute: 0.4 10*3/uL (ref 0.1–1.0)
Monocytes Relative: 6 %
Neutro Abs: 4.5 10*3/uL (ref 1.7–7.7)
Neutrophils Relative %: 69 %
Platelet Count: 186 10*3/uL (ref 150–400)
RBC: 4.59 MIL/uL (ref 4.22–5.81)
RDW: 14.1 % (ref 11.5–15.5)
WBC Count: 6.6 10*3/uL (ref 4.0–10.5)
nRBC: 0 % (ref 0.0–0.2)

## 2022-12-10 MED ORDER — DEXTROSE 5 % IV SOLN
20.0000 mg/m2 | Freq: Once | INTRAVENOUS | Status: AC
  Administered 2022-12-10: 36 mg via INTRAVENOUS
  Filled 2022-12-10: qty 15

## 2022-12-10 MED ORDER — SODIUM CHLORIDE 0.9 % IV SOLN
Freq: Once | INTRAVENOUS | Status: AC
  Filled 2022-12-10: qty 250

## 2022-12-10 MED ORDER — HEPARIN SOD (PORK) LOCK FLUSH 100 UNIT/ML IV SOLN
500.0000 [IU] | Freq: Once | INTRAVENOUS | Status: AC | PRN
  Administered 2022-12-10: 500 [IU]
  Filled 2022-12-10: qty 5

## 2022-12-10 MED ORDER — SODIUM CHLORIDE 0.9% FLUSH
10.0000 mL | Freq: Once | INTRAVENOUS | Status: AC
  Administered 2022-12-10: 10 mL via INTRAVENOUS
  Filled 2022-12-10: qty 10

## 2022-12-10 MED ORDER — SODIUM CHLORIDE 0.9 % IV SOLN
20.0000 mg | Freq: Once | INTRAVENOUS | Status: AC
  Administered 2022-12-10: 20 mg via INTRAVENOUS
  Filled 2022-12-10: qty 20

## 2022-12-10 NOTE — Patient Instructions (Signed)
[facility] [place] [facility]  Discharge Instructions: Thank you for choosing [facility] to provide your oncology and hematology care.  If you have a lab appointment with the Cancer Center, please go directly to the Cancer Center and check in at the registration area.  Wear comfortable clothing and clothing appropriate for easy access to any Portacath or PICC line.   We strive to give you quality time with your provider. You may need to reschedule your appointment if you arrive late (15 or more minutes).  Arriving late affects you and other patients whose appointments are after yours.  Also, if you miss three or more appointments without notifying the office, you may be dismissed from the clinic at the provider's discretion.      For prescription refill requests, have your pharmacy contact our office and allow 72 hours for refills to be completed.    Today you received the following chemotherapy and/or immunotherapy agents Carfilzomib.      To help prevent nausea and vomiting after your treatment, we encourage you to take your nausea medication as directed.  BELOW ARE SYMPTOMS THAT SHOULD BE REPORTED IMMEDIATELY: *FEVER GREATER THAN 100.4 F (38 C) OR HIGHER *CHILLS OR SWEATING *NAUSEA AND VOMITING THAT IS NOT CONTROLLED WITH YOUR NAUSEA MEDICATION *UNUSUAL SHORTNESS OF BREATH *UNUSUAL BRUISING OR BLEEDING *URINARY PROBLEMS (pain or burning when urinating, or frequent urination) *BOWEL PROBLEMS (unusual diarrhea, constipation, pain near the anus) TENDERNESS IN MOUTH AND THROAT WITH OR WITHOUT PRESENCE OF ULCERS (sore throat, sores in mouth, or a toothache) UNUSUAL RASH, SWELLING OR PAIN  UNUSUAL VAGINAL DISCHARGE OR ITCHING   Items with * indicate a potential emergency and should be followed up as soon as possible or go to the Emergency Department if any problems should occur.  Please show the CHEMOTHERAPY ALERT CARD or IMMUNOTHERAPY ALERT CARD at check-in  to the Emergency Department and triage nurse.  Should you have questions after your visit or need to cancel or reschedule your appointment, please contact [facility] [place] [facility]  [phone] and follow the prompts.  Office hours are 8:00 a.m. to 4:30 p.m. Monday - Friday. Please note that voicemails left after 4:00 p.m. may not be returned until the following business day.  We are closed weekends and major holidays. You have access to a nurse at all times for urgent questions. Please call the main number to the clinic [phone] and follow the prompts.  For any non-urgent questions, you may also contact your provider using MyChart. We now offer e-Visits for anyone 18 and older to request care online for non-urgent symptoms. For details visit [email or web address].   Also download the MyChart app! Go to the app store, search "MyChart", open the app, select [facility], and log in with your MyChart username and password.    

## 2022-12-11 ENCOUNTER — Other Ambulatory Visit: Payer: Self-pay

## 2022-12-11 ENCOUNTER — Other Ambulatory Visit: Payer: Self-pay | Admitting: Oncology

## 2022-12-11 DIAGNOSIS — I517 Cardiomegaly: Secondary | ICD-10-CM | POA: Diagnosis not present

## 2022-12-11 DIAGNOSIS — C9002 Multiple myeloma in relapse: Secondary | ICD-10-CM | POA: Diagnosis not present

## 2022-12-11 MED ORDER — AMITRIPTYLINE HCL 25 MG PO TABS
25.0000 mg | ORAL_TABLET | Freq: Every day | ORAL | 0 refills | Status: DC
  Filled 2022-12-11: qty 90, 90d supply, fill #0

## 2022-12-11 MED ORDER — OXYCODONE HCL 10 MG PO TABS
10.0000 mg | ORAL_TABLET | Freq: Three times a day (TID) | ORAL | 0 refills | Status: DC | PRN
  Filled 2022-12-11: qty 90, 30d supply, fill #0

## 2022-12-13 ENCOUNTER — Ambulatory Visit: Payer: BC Managed Care – PPO

## 2022-12-16 ENCOUNTER — Other Ambulatory Visit: Payer: Self-pay | Admitting: Oncology

## 2022-12-19 ENCOUNTER — Telehealth: Payer: Self-pay | Admitting: *Deleted

## 2022-12-19 NOTE — Telephone Encounter (Signed)
I got a message that Mr. He is could not get his Pomalyst because it is missing the rems number.  I called to [facility] spoke to Lurena Joiner gave her the rems #[id number].  She states that all information and they will be starting to run it

## 2022-12-19 NOTE — Telephone Encounter (Signed)
Call from pharmacy stating that they received prescription with no REMS auth # on it. Please correct and resubmit

## 2022-12-24 ENCOUNTER — Inpatient Hospital Stay: Payer: BC Managed Care – PPO

## 2022-12-24 ENCOUNTER — Other Ambulatory Visit: Payer: BC Managed Care – PPO

## 2022-12-24 ENCOUNTER — Encounter: Payer: Self-pay | Admitting: Oncology

## 2022-12-24 ENCOUNTER — Inpatient Hospital Stay: Payer: BC Managed Care – PPO | Attending: Internal Medicine | Admitting: Oncology

## 2022-12-24 VITALS — BP 140/82 | HR 69 | Temp 97.4°F | Resp 18 | Ht 67.0 in | Wt 164.4 lb

## 2022-12-24 DIAGNOSIS — G620 Drug-induced polyneuropathy: Secondary | ICD-10-CM | POA: Diagnosis not present

## 2022-12-24 DIAGNOSIS — Z5112 Encounter for antineoplastic immunotherapy: Secondary | ICD-10-CM | POA: Insufficient documentation

## 2022-12-24 DIAGNOSIS — C9002 Multiple myeloma in relapse: Secondary | ICD-10-CM

## 2022-12-24 DIAGNOSIS — Z79899 Other long term (current) drug therapy: Secondary | ICD-10-CM | POA: Diagnosis not present

## 2022-12-24 DIAGNOSIS — R5383 Other fatigue: Secondary | ICD-10-CM

## 2022-12-24 DIAGNOSIS — T451X5A Adverse effect of antineoplastic and immunosuppressive drugs, initial encounter: Secondary | ICD-10-CM

## 2022-12-24 DIAGNOSIS — R0602 Shortness of breath: Secondary | ICD-10-CM

## 2022-12-24 DIAGNOSIS — R739 Hyperglycemia, unspecified: Secondary | ICD-10-CM

## 2022-12-24 DIAGNOSIS — Z5111 Encounter for antineoplastic chemotherapy: Secondary | ICD-10-CM

## 2022-12-24 LAB — CMP (CANCER CENTER ONLY)
ALT: 19 U/L (ref 0–44)
AST: 20 U/L (ref 15–41)
Albumin: 3.5 g/dL (ref 3.5–5.0)
Alkaline Phosphatase: 79 U/L (ref 38–126)
Anion gap: 7 (ref 5–15)
BUN: 16 mg/dL (ref 6–20)
CO2: 25 mmol/L (ref 22–32)
Calcium: 8.7 mg/dL — ABNORMAL LOW (ref 8.9–10.3)
Chloride: 104 mmol/L (ref 98–111)
Creatinine: 1.39 mg/dL — ABNORMAL HIGH (ref 0.61–1.24)
GFR, Estimated: >60 mL/min (ref >60)
Glucose, Bld: 132 mg/dL — ABNORMAL HIGH (ref 70–99)
Potassium: 3.4 mmol/L — ABNORMAL LOW (ref 3.5–5.1)
Sodium: 136 mmol/L (ref 135–145)
Total Bilirubin: 0.6 mg/dL (ref 0.3–1.2)
Total Protein: 5.8 g/dL — ABNORMAL LOW (ref 6.5–8.1)

## 2022-12-24 LAB — CBC WITH DIFFERENTIAL (CANCER CENTER ONLY)
Abs Immature Granulocytes: 0.01 10*3/uL (ref 0.00–0.07)
Basophils Absolute: 0 10*3/uL (ref 0.0–0.1)
Basophils Relative: 1 %
Eosinophils Absolute: 0.2 10*3/uL (ref 0.0–0.5)
Eosinophils Relative: 5 %
HCT: 40.1 % (ref 39.0–52.0)
Hemoglobin: 14.1 g/dL (ref 13.0–17.0)
Immature Granulocytes: 0 %
Lymphocytes Relative: 29 %
Lymphs Abs: 1.1 10*3/uL (ref 0.7–4.0)
MCH: 32.2 pg (ref 26.0–34.0)
MCHC: 35.2 g/dL (ref 30.0–36.0)
MCV: 91.6 fL (ref 80.0–100.0)
Monocytes Absolute: 0.3 10*3/uL (ref 0.1–1.0)
Monocytes Relative: 8 %
Neutro Abs: 2.3 10*3/uL (ref 1.7–7.7)
Neutrophils Relative %: 57 %
Platelet Count: 179 10*3/uL (ref 150–400)
RBC: 4.38 MIL/uL (ref 4.22–5.81)
RDW: 13.2 % (ref 11.5–15.5)
WBC Count: 4 10*3/uL (ref 4.0–10.5)
nRBC: 0 % (ref 0.0–0.2)

## 2022-12-24 MED ORDER — SODIUM CHLORIDE 0.9 % IV SOLN
Freq: Once | INTRAVENOUS | Status: AC
  Filled 2022-12-24: qty 250

## 2022-12-24 MED ORDER — SODIUM CHLORIDE 0.9% FLUSH
10.0000 mL | INTRAVENOUS | Status: DC | PRN
  Administered 2022-12-24: 10 mL
  Filled 2022-12-24: qty 10

## 2022-12-24 MED ORDER — SODIUM CHLORIDE 0.9 % IV SOLN
20.0000 mg | Freq: Once | INTRAVENOUS | Status: AC
  Administered 2022-12-24: 20 mg via INTRAVENOUS
  Filled 2022-12-24: qty 20

## 2022-12-24 MED ORDER — HEPARIN SOD (PORK) LOCK FLUSH 100 UNIT/ML IV SOLN
500.0000 [IU] | Freq: Once | INTRAVENOUS | Status: AC | PRN
  Administered 2022-12-24: 500 [IU]
  Filled 2022-12-24: qty 5

## 2022-12-24 MED ORDER — DEXTROSE 5 % IV SOLN
20.0000 mg/m2 | Freq: Once | INTRAVENOUS | Status: AC
  Administered 2022-12-24: 36 mg via INTRAVENOUS
  Filled 2022-12-24: qty 15

## 2022-12-24 NOTE — Progress Notes (Signed)
Hematology/Oncology Consult note [facility]  Telephone:([phone] [phone]  Patient Care Team: Doreene Nest, NP as PCP - General (Internal Medicine) Martin Majestic, MD as PCP - Hematology/Oncology Creig Hines, MD as Consulting Physician (Hematology and Oncology)   Name of the patient: James House  [id number]  10/10/1970   Date of visit: 12/24/22  Diagnosis- kappa light chain multiple myeloma in relapse   Chief complaint/ Reason for visit-on treatment assessment prior to cycle 6-day 1 of carfilzomib  Heme/Onc history: Patient is a 52- year-old male with a history of kappa light chain myeloma that was treated by [name].  History is as follows:   1.  Patient presented with renal insufficiency with a creatinine of 2.4 in April 2018.  Kidney biopsy showed myeloma cast nephropathy.  Kappa light chain was 3004 and lambda 0.22 with a kappa lambda ratio of 13,000 655.  Skeletal survey showed multiple calvarial and marrow lesions.  Bone marrow biopsy in May 2018 showed 43% plasma cells.  He received CyBorD for cycle 1 in May 2018 and was subsequently switched to RVD.  He received 4 cycles followed by a repeat bone marrow biopsy in August 2018 which showed no increase in plasma cells.   2.  He underwent autologous stem cell transplantation on 01/30/2017.  He was started on Revlimid maintenance 10 mg daily in January 2019.   3.  Labs from January February and March 2019 done at [facility] showed no M spike, normal kappa lambda ratio of 1.06.  He was last seen by them in April 2019.   4. Following that patient was admitted to [facility] for healthcare associated pneumonia and was recently discharged.  He wished to transfer his care to [place] at this time.  He was on maintenance Revlimid 5 mg which was then reduced to 2.5 mg.  Follows up with pain clinic for chemo-induced peripheral neuropathy with [name].  His urine tested positive for [facility] and therefore  patient was discharged from pain clinic   5.  Noted to have gradually increasing free kappa light chainFrom 15 in 20 21-39.5 with a ratio of 3.09.  He was seen by [facility] again and underwent a repeat bone marrow biopsy which did not show any evidence of clonal plasma cells.  PET CT scan also did not show any new active lytic lesions in May 2023.Marland Kitchen  Revlimid dose was increased to 15 mg 3 weeks on 1 week off but kappa light chains show continuing to increase   6.  PET CT scan in December 2023 did not show any evidence of active myeloma.  Patient underwent repeat bone marrow biopsy at [facility] which showed 6% plasma cellsConsistent with persistent plasma cell neoplasm.  74 to 86% of isolated plasma cells show loss of T p53 and monosomy 13.  Patient was seen by [name] at [facility] and was recommended Darzalex Pomalyst dexamethasone regimen   7.  Disease progression after 8 weekly cycles of Darazalex kappa light chain increased from 332-646 with a ratio that went up from 39-57 concerning for disease progression.  Patient switched to carfilzomib Pomalyst dexamethasone regimen    Interval history-patient continues to report chronic fatigue as well as exertional shortness of breath.He has not been very active and has gained 2 pounds as compared to last month.  He reports generalized pains all over as well as symptoms of chronic peripheral neuropathy  ECOG PS- 1 Pain scale- 3 Opioid associated constipation- no  Review  of systems- Review of Systems  Constitutional:  Positive for malaise/fatigue. Negative for chills, fever and weight loss.  HENT:  Negative for congestion, ear discharge and nosebleeds.   Eyes:  Negative for blurred vision.  Respiratory:  Positive for shortness of breath. Negative for cough, hemoptysis, sputum production and wheezing.   Cardiovascular:  Negative for chest pain, palpitations, orthopnea and claudication.  Gastrointestinal:  Negative for abdominal pain, blood in stool, constipation,  diarrhea, heartburn, melena, nausea and vomiting.  Genitourinary:  Negative for dysuria, flank pain, frequency, hematuria and urgency.  Musculoskeletal:  Positive for myalgias. Negative for back pain and joint pain.  Skin:  Negative for rash.  Neurological:  Positive for sensory change (Peripheral neuropathy). Negative for dizziness, tingling, focal weakness, seizures, weakness and headaches.  Endo/Heme/Allergies:  Does not bruise/bleed easily.  Psychiatric/Behavioral:  Negative for depression and suicidal ideas. The patient does not have insomnia.       No Known Allergies   Past Medical History:  Diagnosis Date   CKD (chronic kidney disease) stage 3, GFR 30-59 ml/min (HCC)    Elevated sedimentation rate 12/24/2017   Hypertension    Multiple myeloma (HCC) 08/11/2017   Neuropathy    Pneumonia    Severe sepsis (HCC) 08/11/2017   Shingles 08/03/2019     Past Surgical History:  Procedure Laterality Date   ABDOMINAL SURGERY     COLONOSCOPY WITH PROPOFOL N/A 05/25/2021   Procedure: COLONOSCOPY WITH PROPOFOL;  Surgeon: Toney Reil, MD;  Location: [facility] ENDOSCOPY;  Service: Gastroenterology;  Laterality: N/A;   IR IMAGING GUIDED PORT INSERTION  08/02/2022   JOINT REPLACEMENT     right knee   KNEE SURGERY Left     Social History   Socioeconomic History   Marital status: Single    Spouse name: [name]   Number of children: 3   Years of education: Not on file   Highest education level: Not on file  Occupational History   Not on file  Tobacco Use   Smoking status: Former   Smokeless tobacco: Current    Types: Snuff  Vaping Use   Vaping status: Never Used  Substance and Sexual Activity   Alcohol use: No   Drug use: No   Sexual activity: Yes  Other Topics Concern   Not on file  Social History Narrative   Live in private residence with spouse and mother in law   Social Determinants of Health   Financial Resource Strain: Low Risk  (08/12/2017)   Overall Financial  Resource Strain (CARDIA)    Difficulty of Paying Living Expenses: Not hard at all  Food Insecurity: No Food Insecurity (08/12/2017)   Hunger Vital Sign    Worried About Running Out of Food in the Last Year: Never true    Ran Out of Food in the Last Year: Never true  Transportation Needs: No Transportation Needs (08/12/2017)   PRAPARE - Administrator, Civil Service (Medical): No    Lack of Transportation (Non-Medical): No  Physical Activity: Sufficiently Active (08/12/2017)   Exercise Vital Sign    Days of Exercise per Week: 7 days    Minutes of Exercise per Session: 30 min  Stress: No Stress Concern Present (08/12/2017)   [employer] of Occupational Health - Occupational Stress Questionnaire    Feeling of Stress : Only a little  Social Connections: Somewhat Isolated (08/12/2017)   Social Connection and Isolation Panel [NHANES]    Frequency of Communication with Friends and Family: Once a week  Frequency of Social Gatherings with Friends and Family: Once a week    Attends Religious Services: More than 4 times per year    Active Member of [employer] or Organizations: No    Attends Banker Meetings: Never    Marital Status: Married  Catering manager Violence: Not At Risk (08/12/2017)   Humiliation, Afraid, Rape, and Kick questionnaire    Fear of Current or Ex-Partner: No    Emotionally Abused: No    Physically Abused: No    Sexually Abused: No    Family History  Problem Relation Age of Onset   Cancer Mother 69       Pancreatic   COPD Mother    Diabetes Mother    Hyperlipidemia Mother    Hypertension Mother    Cancer Maternal Uncle        pancreatic   Cancer Maternal Grandmother 68       colon   COPD Maternal Grandmother    Diabetes Maternal Grandmother    Hypertension Maternal Grandmother      Current Outpatient Medications:    acyclovir (ZOVIRAX) 400 MG tablet, Take 1 tablet (400 mg total) by mouth 2 (two) times daily., Disp: 60 tablet, Rfl: 2    albuterol (VENTOLIN HFA) 108 (90 Base) MCG/ACT inhaler, Inhale 2 puffs into the lungs every 6 (six) hours as needed for wheezing or shortness of breath., Disp: 6.7 g, Rfl: 2   amitriptyline (ELAVIL) 25 MG tablet, Take 1 tablet (25 mg total) by mouth at bedtime. For headache prevention and sleep, Disp: 90 tablet, Rfl: 0   amLODipine-benazepril (LOTREL) 10-20 MG capsule, Take 1 capsule by mouth daily. for blood pressure., Disp: 90 capsule, Rfl: 0   aspirin EC 81 MG tablet, Take by mouth., Disp: , Rfl:    baclofen (LIORESAL) 10 MG tablet, Take 1 tablet (10 mg total) by mouth 4 (four) times daily as needed for muscle spasms., Disp: 360 tablet, Rfl: 0   celecoxib (CELEBREX) 200 MG capsule, Take 1 capsule (200 mg total) by mouth daily., Disp: 30 capsule, Rfl: 2   cyanocobalamin (VITAMIN B12) 1000 MCG/ML injection, Inject 1 mL (1,000 mcg total) into the muscle every 30 (thirty) days., Disp: 1 mL, Rfl: 11   dexamethasone (DECADRON) 4 MG tablet, Take 5 tablets (20 mg total) by mouth once a week. Take first dose 1 hour prior to reporting to clinic for daratumumab. Take 2nd dose the day after daratumumab., Disp: 20 tablet, Rfl: 2   DULoxetine (CYMBALTA) 60 MG capsule, Take 1 capsule (60 mg total) by mouth daily., Disp: 60 capsule, Rfl: 1   levothyroxine (SYNTHROID) 50 MCG tablet, Take 1 tablet (50 mcg total) by mouth daily before breakfast., Disp: 30 tablet, Rfl: 2   lidocaine-prilocaine (EMLA) cream, Apply 1 Application topically as needed., Disp: 30 g, Rfl: 0   montelukast (SINGULAIR) 10 MG tablet, Take 1 tablet in the evening 1 day before your treatment. Day of treatment take 1 tablet in the morning for the first 2 treatments of Daratumumab, Disp: 4 tablet, Rfl: 0   OLANZapine (ZYPREXA) 10 MG tablet, Take 1 tablet (10 mg total) by mouth at bedtime. To help with nausea prevention., Disp: 30 tablet, Rfl: 1   OLANZapine (ZYPREXA) 10 MG tablet, Take by mouth., Disp: , Rfl:    oxyCODONE ER (XTAMPZA ER) 9 MG C12A,  Take 9 mg by mouth every 12 (twelve) hours., Disp: 60 capsule, Rfl: 0   Oxycodone HCl 10 MG TABS, Take 1 tablet (10 mg  total) by mouth every 8 (eight) hours as needed., Disp: 90 tablet, Rfl: 0   POMALYST 2 MG capsule, TAKE 1 CAPSULE DAILY FOR 21 DAYS AND THEN 7 DAYS OFF, Disp: 21 capsule, Rfl: 0   potassium chloride SA (KLOR-CON M) 20 MEQ tablet, Take 2 tablets (40 mEq total) by mouth 2 (two) times daily., Disp: 120 tablet, Rfl: 0   pregabalin (LYRICA) 300 MG capsule, Take 1 capsule (300 mg total) by mouth 2 (two) times daily., Disp: 60 capsule, Rfl: 3   prochlorperazine (COMPAZINE) 10 MG tablet, Take by mouth., Disp: , Rfl:    amoxicillin-clavulanate (AUGMENTIN) 875-125 MG tablet, Take 1 tablet by mouth 2 (two) times daily. (Patient not taking: Reported on 11/28/2022), Disp: 14 tablet, Rfl: 0 No current facility-administered medications for this visit.  Facility-Administered Medications Ordered in Other Visits:    sodium chloride flush (NS) 0.9 % injection 10 mL, 10 mL, Intracatheter, PRN, Creig Hines, MD, 10 mL at 12/24/22 0912  Physical exam:  Vitals:   12/24/22 0835  BP: (!) 140/82  Pulse: 69  Resp: 18  Temp: (!) 97.4 F (36.3 C)  TempSrc: Tympanic  SpO2: 100%  Weight: 164 lb 6.4 oz (74.6 kg)  Height: 5\' 7"  (1.702 m)   Physical Exam Cardiovascular:     Rate and Rhythm: Normal rate and regular rhythm.     Heart sounds: Normal heart sounds.  Pulmonary:     Effort: Pulmonary effort is normal.     Breath sounds: Normal breath sounds.  Musculoskeletal:     Right lower leg: No edema.     Left lower leg: No edema.  Skin:    General: Skin is warm and dry.  Neurological:     Mental Status: He is alert and oriented to person, place, and time.         Latest Ref Rng & Units 12/24/2022    8:20 AM  CMP  Glucose 70 - 99 mg/dL 161   BUN 6 - 20 mg/dL 16   Creatinine 0.96 - 1.24 mg/dL 0.45   Sodium 409 - 811 mmol/L 136   Potassium 3.5 - 5.1 mmol/L 3.4   Chloride 98 - 111 mmol/L  104   CO2 22 - 32 mmol/L 25   Calcium 8.9 - 10.3 mg/dL 8.7   Total Protein 6.5 - 8.1 g/dL 5.8   Total Bilirubin 0.3 - 1.2 mg/dL 0.6   Alkaline Phos 38 - 126 U/L 79   AST 15 - 41 U/L 20   ALT 0 - 44 U/L 19       Latest Ref Rng & Units 12/24/2022    8:20 AM  CBC  WBC 4.0 - 10.5 K/uL 4.0   Hemoglobin 13.0 - 17.0 g/dL 91.4   Hematocrit 78.2 - 52.0 % 40.1   Platelets 150 - 400 K/uL 179     No images are attached to the encounter.  No results found.   Assessment and plan- Patient is a 52 y.o. male with history of kappa light chain multiple myeloma currently in partial remission here for on treatment assessment prior to cycle 6-day 1 of carfilzomib  Counts okay to proceed with cycle 6-day 1 of carfilzomib today.  He also starts taking Pomalyst 22 mg 3 weeks on and 1 week off.  He has been getting weekly carfilzomib at the dose of 20 mg/m 3 weeks on and 1 week off.  I will see him back in 2 weeks for cycle 6-day 15.  Patient has  complained of generalized bodyaches exertional shortness of breath and fatigue despite reducing the dose of both carfilzomib and Pomalyst.  He had an echocardiogram in June 2024 as well as a recent echocardiogram at [facility] which both show normal ejection fraction.  He was noted to have left ventricular hypertrophy which is likely secondary to his chronic hypertension.  Blood pressure in my office today is 140/82.  He did not bring his blood pressure log with him today and have asked him to send me a patient message with his blood pressure readings.  He has an upcoming appointment with cardiology next month.  Will also get a CT chest with contrast to rule out any other etiology for shortness of breath.  Overall his myeloma has responded wellTo treatment so far with normalization of free light chain ratio and M protein is presently not detectable on SPEP.  Chemo-induced peripheral neuropathy: Continue as needed oxycodone   Visit Diagnosis 1. High risk medication use    2. Multiple myeloma in relapse (HCC)   3. Encounter for antineoplastic chemotherapy   4. Chemotherapy-induced peripheral neuropathy (HCC)      Dr. Owens Shark, MD, MPH [facility] at [facility] [phone] 12/24/2022 12:43 PM

## 2022-12-24 NOTE — Progress Notes (Signed)
Continue kyprolis 20mg /m2 per Dr Smith Robert

## 2022-12-24 NOTE — Patient Instructions (Signed)
[facility]  Discharge Instructions: Thank you for choosing [facility] to provide your oncology and hematology care.  If you have a lab appointment with the Cancer Center, please go directly to the Cancer Center and check in at the registration area.  Wear comfortable clothing and clothing appropriate for easy access to any Portacath or PICC line.   We strive to give you quality time with your provider. You may need to reschedule your appointment if you arrive late (15 or more minutes).  Arriving late affects you and other patients whose appointments are after yours.  Also, if you miss three or more appointments without notifying the office, you may be dismissed from the clinic at the provider's discretion.      For prescription refill requests, have your pharmacy contact our office and allow 72 hours for refills to be completed.    Today you received the following chemotherapy and/or immunotherapy agents carfilzomib      To help prevent nausea and vomiting after your treatment, we encourage you to take your nausea medication as directed.  BELOW ARE SYMPTOMS THAT SHOULD BE REPORTED IMMEDIATELY: *FEVER GREATER THAN 100.4 F (38 C) OR HIGHER *CHILLS OR SWEATING *NAUSEA AND VOMITING THAT IS NOT CONTROLLED WITH YOUR NAUSEA MEDICATION *UNUSUAL SHORTNESS OF BREATH *UNUSUAL BRUISING OR BLEEDING *URINARY PROBLEMS (pain or burning when urinating, or frequent urination) *BOWEL PROBLEMS (unusual diarrhea, constipation, pain near the anus) TENDERNESS IN MOUTH AND THROAT WITH OR WITHOUT PRESENCE OF ULCERS (sore throat, sores in mouth, or a toothache) UNUSUAL RASH, SWELLING OR PAIN  UNUSUAL VAGINAL DISCHARGE OR ITCHING   Items with * indicate a potential emergency and should be followed up as soon as possible or go to the Emergency Department if any problems should occur.  Please show the CHEMOTHERAPY ALERT CARD or IMMUNOTHERAPY ALERT CARD at check-in  to the Emergency Department and triage nurse.  Should you have questions after your visit or need to cancel or reschedule your appointment, please contact [facility]  [phone] and follow the prompts.  Office hours are 8:00 a.m. to 4:30 p.m. Monday - Friday. Please note that voicemails left after 4:00 p.m. may not be returned until the following business day.  We are closed weekends and major holidays. You have access to a nurse at all times for urgent questions. Please call the main number to the clinic [phone] and follow the prompts.  For any non-urgent questions, you may also contact your provider using MyChart. We now offer e-Visits for anyone 23 and older to request care online for non-urgent symptoms. For details visit [email or web address].   Also download the MyChart app! Go to the app store, search "MyChart", open the app, select , and log in with your MyChart username and password.

## 2022-12-25 ENCOUNTER — Encounter: Payer: Self-pay | Admitting: Primary Care

## 2022-12-25 ENCOUNTER — Other Ambulatory Visit: Payer: Self-pay

## 2022-12-25 ENCOUNTER — Ambulatory Visit (INDEPENDENT_AMBULATORY_CARE_PROVIDER_SITE_OTHER): Payer: BC Managed Care – PPO | Admitting: Primary Care

## 2022-12-25 ENCOUNTER — Inpatient Hospital Stay (HOSPITAL_BASED_OUTPATIENT_CLINIC_OR_DEPARTMENT_OTHER): Payer: BC Managed Care – PPO | Admitting: Hospice and Palliative Medicine

## 2022-12-25 VITALS — BP 168/96 | HR 107 | Temp 98.4°F | Ht 67.0 in | Wt 166.0 lb

## 2022-12-25 DIAGNOSIS — C9002 Multiple myeloma in relapse: Secondary | ICD-10-CM | POA: Diagnosis not present

## 2022-12-25 DIAGNOSIS — Z515 Encounter for palliative care: Secondary | ICD-10-CM | POA: Diagnosis not present

## 2022-12-25 DIAGNOSIS — I10 Essential (primary) hypertension: Secondary | ICD-10-CM

## 2022-12-25 DIAGNOSIS — R0683 Snoring: Secondary | ICD-10-CM | POA: Insufficient documentation

## 2022-12-25 LAB — KAPPA/LAMBDA LIGHT CHAINS
Kappa free light chain: 26 mg/L — ABNORMAL HIGH (ref 3.3–19.4)
Kappa, lambda light chain ratio: 2.83 — ABNORMAL HIGH (ref 0.26–1.65)
Lambda free light chains: 9.2 mg/L (ref 5.7–26.3)

## 2022-12-25 MED ORDER — AMLODIPINE BESY-BENAZEPRIL HCL 10-40 MG PO CAPS
1.0000 | ORAL_CAPSULE | Freq: Every day | ORAL | 0 refills | Status: DC
  Filled 2022-12-25: qty 90, 90d supply, fill #0

## 2022-12-25 NOTE — Progress Notes (Signed)
Virtual Visit via Telephone Note  I connected with James House. on 12/25/22 at  2:00 PM EDT by telephone and verified that I am speaking with the correct person using two identifiers.  Location: Patient: Home Provider: Clinic   I discussed the limitations, risks, security and privacy concerns of performing an evaluation and management service by telephone and the availability of in person appointments. I also discussed with the patient that there may be a patient responsible charge related to this service. The patient expressed understanding and agreed to proceed.   History of Present Illness: James House. is a 52 y.o. male with multiple medical problems including kappa light chain myeloma status post stem cell transplant in 2018 followed by Revlimid maintenance.  Repeat bone marrow biopsy in 2023 suggested persistent plasma cell neoplasm.  Patient was restarted on treatment with Darzalex Pomalyst and dexamethasone regimen.  He has history of chronic pain and was previously managed by the pain clinic prior to being discharged due to UDS positive for THC.  Patient has had some difficulty with insomnia.  He was referred to palliative care to address goals and manage ongoing symptoms.    Observations/Objective: I called and spoke with patient by phone.  He reports that he is doing about the same.  Denies any significant changes or concerns.  Pain is present but stable on current regimen.  Moods are reportedly stable although patient does endorse occasional anxiety.  He still continues to have insomnia despite liberalized dosing of amitriptyline.  Patient was seen this morning by PCP and is being referred for sleep study.  No other symptomatic complaints or concerns.  Patient pending CT.  Assessment and Plan: Multiple Myeloma -on treatment with carfilzomib   Neoplasm related pain -continue Xtampza/oxycodone  Insomnia -continue amitriptyline.  Note plan to refer for sleep study  Follow  Up Instructions: As needed   I discussed the assessment and treatment plan with the patient. The patient was provided an opportunity to ask questions and all were answered. The patient agreed with the plan and demonstrated an understanding of the instructions.   The patient was advised to call back or seek an in-person evaluation if the symptoms worsen or if the condition fails to improve as anticipated.  I provided 5 minutes of non-face-to-face time during this encounter.   Malachy Moan, NP

## 2022-12-25 NOTE — Assessment & Plan Note (Signed)
Uncontrolled.   Increase amlodipine-benazepril to 10-40 mg. Also symptoms today suggestive of sleep apnea, which would be contributing to hypertension especially in the setting of weight gain.   Referral placed to Pulmonology for sleep study. Reviewed CMP from yesterday per oncology.  Send blood pressure readings via MyChart in 2 weeks.

## 2022-12-25 NOTE — Patient Instructions (Signed)
We increased your dose of amlodipine-benazepril to 10-40 mg daily for blood pressure.  I sent a new prescription to your pharmacy.  You will either be contacted via phone regarding your referral to pulmonology for the sleep study, or you may receive a letter on your MyChart portal from our referral team with instructions for scheduling an appointment. Please let us know if you have not been contacted by anyone within two weeks.  Please send me blood pressure readings via MyChart in 2 weeks.  It was a pleasure to see you today!

## 2022-12-25 NOTE — Assessment & Plan Note (Addendum)
Symptoms suspicious for sleep apnea.  STOP-BANG sleep apnea questionnaire today with score of 5 today.  Referral placed to pulmonology for sleep study.

## 2022-12-25 NOTE — Progress Notes (Signed)
Subjective:    Patient ID: James Lins., male    DOB: [birth year], 52 y.o.   MRN: [id number]  HPI  James House. is a very pleasant 52 y.o. male with a history of hypertension, migraines, chronic pain syndrome, multiple myeloma, CKD, GAD who presents today for follow up of hypertension and headaches.   He was last evaluated on 11/28/22 for his routine physical. During this visit his BP readings were noted to be consistently above goal despite management on amlodipine 10 mg alone.   Since his last visit he is compliant to his amlodipine-benazepril 10-20 mg daily. He is checking BP at home which is running in the 160's systolic. He has gained 20 pounds over the last few months.   His headaches have improved since starting amitriptyline 25 mg HS. He continues to experience sleep disturbance as he will wake during the night a few times.  He has been told that he snores when he sleeps, also experiences daytime tiredness every day of the week.  He has not been witnessed to experience periods of apnea during sleep, although he lives alone.  Evaluated by his oncologist yesterday and underwent lab work including CMP.  Body mass index is 26 kg/m.   BP Readings from Last 3 Encounters:  12/25/22 (!) 168/96  12/24/22 (!) 140/82  12/10/22 (!) 159/99     Review of Systems  Constitutional:  Positive for fatigue.  Cardiovascular:  Negative for chest pain.  Neurological:  Negative for dizziness and headaches.  Psychiatric/Behavioral:  Positive for sleep disturbance.          Past Medical History:  Diagnosis Date   CKD (chronic kidney disease) stage 3, GFR 30-59 ml/min (HCC)    Elevated sedimentation rate 12/24/2017   Hypertension    Multiple myeloma (HCC) 08/11/2017   Neuropathy    Pneumonia    Severe sepsis (HCC) 08/11/2017   Shingles 08/03/2019    Social History   Socioeconomic History   Marital status: Single    Spouse name: James House   Number of children: 3   Years of  education: Not on file   Highest education level: Not on file  Occupational History   Not on file  Tobacco Use   Smoking status: Former   Smokeless tobacco: Current    Types: Snuff  Vaping Use   Vaping status: Never Used  Substance and Sexual Activity   Alcohol use: No   Drug use: No   Sexual activity: Yes  Other Topics Concern   Not on file  Social History Narrative   Live in private residence with spouse and mother in law   Social Determinants of Health   Financial Resource Strain: Low Risk  (08/12/2017)   Overall Financial Resource Strain (CARDIA)    Difficulty of Paying Living Expenses: Not hard at all  Food Insecurity: No Food Insecurity (08/12/2017)   Hunger Vital Sign    Worried About Running Out of Food in the Last Year: Never true    Ran Out of Food in the Last Year: Never true  Transportation Needs: No Transportation Needs (08/12/2017)   PRAPARE - Administrator, Civil Service (Medical): No    Lack of Transportation (Non-Medical): No  Physical Activity: Sufficiently Active (08/12/2017)   Exercise Vital Sign    Days of Exercise per Week: 7 days    Minutes of Exercise per Session: 30 min  Stress: No Stress Concern Present (08/12/2017)   [employer] of Occupational  Health - Occupational Stress Questionnaire    Feeling of Stress : Only a little  Social Connections: Somewhat Isolated (08/12/2017)   Social Connection and Isolation Panel [NHANES]    Frequency of Communication with Friends and Family: Once a week    Frequency of Social Gatherings with Friends and Family: Once a week    Attends Religious Services: More than 4 times per year    Active Member of [employer] or Organizations: No    Attends Banker Meetings: Never    Marital Status: Married  Catering manager Violence: Not At Risk (08/12/2017)   Humiliation, Afraid, Rape, and Kick questionnaire    Fear of Current or Ex-Partner: No    Emotionally Abused: No    Physically Abused: No     Sexually Abused: No    Past Surgical History:  Procedure Laterality Date   ABDOMINAL SURGERY     COLONOSCOPY WITH PROPOFOL N/A 05/25/2021   Procedure: COLONOSCOPY WITH PROPOFOL;  Surgeon: Toney Reil, MD;  Location: [facility] ENDOSCOPY;  Service: Gastroenterology;  Laterality: N/A;   IR IMAGING GUIDED PORT INSERTION  08/02/2022   JOINT REPLACEMENT     right knee   KNEE SURGERY Left     Family History  Problem Relation Age of Onset   Cancer Mother 103       Pancreatic   COPD Mother    Diabetes Mother    Hyperlipidemia Mother    Hypertension Mother    Cancer Maternal Uncle        pancreatic   Cancer Maternal Grandmother 35       colon   COPD Maternal Grandmother    Diabetes Maternal Grandmother    Hypertension Maternal Grandmother     No Known Allergies  Current Outpatient Medications on File Prior to Visit  Medication Sig Dispense Refill   acyclovir (ZOVIRAX) 400 MG tablet Take 1 tablet (400 mg total) by mouth 2 (two) times daily. 60 tablet 2   albuterol (VENTOLIN HFA) 108 (90 Base) MCG/ACT inhaler Inhale 2 puffs into the lungs every 6 (six) hours as needed for wheezing or shortness of breath. 6.7 g 2   amitriptyline (ELAVIL) 25 MG tablet Take 1 tablet (25 mg total) by mouth at bedtime. For headache prevention and sleep 90 tablet 0   aspirin EC 81 MG tablet Take by mouth.     baclofen (LIORESAL) 10 MG tablet Take 1 tablet (10 mg total) by mouth 4 (four) times daily as needed for muscle spasms. 360 tablet 0   celecoxib (CELEBREX) 200 MG capsule Take 1 capsule (200 mg total) by mouth daily. 30 capsule 2   cyanocobalamin (VITAMIN B12) 1000 MCG/ML injection Inject 1 mL (1,000 mcg total) into the muscle every 30 (thirty) days. 1 mL 11   lidocaine-prilocaine (EMLA) cream Apply 1 Application topically as needed. 30 g 0   montelukast (SINGULAIR) 10 MG tablet Take 1 tablet in the evening 1 day before your treatment. Day of treatment take 1 tablet in the morning for the first 2  treatments of Daratumumab 4 tablet 0   OLANZapine (ZYPREXA) 10 MG tablet Take 1 tablet (10 mg total) by mouth at bedtime. To help with nausea prevention. 30 tablet 1   oxyCODONE ER (XTAMPZA ER) 9 MG C12A Take 9 mg by mouth every 12 (twelve) hours. 60 capsule 0   Oxycodone HCl 10 MG TABS Take 1 tablet (10 mg total) by mouth every 8 (eight) hours as needed. 90 tablet 0   POMALYST 2  MG capsule TAKE 1 CAPSULE DAILY FOR 21 DAYS AND THEN 7 DAYS OFF 21 capsule 0   potassium chloride SA (KLOR-CON M) 20 MEQ tablet Take 2 tablets (40 mEq total) by mouth 2 (two) times daily. 120 tablet 0   prochlorperazine (COMPAZINE) 10 MG tablet Take by mouth.     amoxicillin-clavulanate (AUGMENTIN) 875-125 MG tablet Take 1 tablet by mouth 2 (two) times daily. (Patient not taking: Reported on 11/28/2022) 14 tablet 0   dexamethasone (DECADRON) 4 MG tablet Take 5 tablets (20 mg total) by mouth once a week. Take first dose 1 hour prior to reporting to clinic for daratumumab. Take 2nd dose the day after daratumumab. (Patient not taking: Reported on 12/25/2022) 20 tablet 2   DULoxetine (CYMBALTA) 60 MG capsule Take 1 capsule (60 mg total) by mouth daily. 60 capsule 1   levothyroxine (SYNTHROID) 50 MCG tablet Take 1 tablet (50 mcg total) by mouth daily before breakfast. (Patient not taking: Reported on 12/25/2022) 30 tablet 2   OLANZapine (ZYPREXA) 10 MG tablet Take by mouth. (Patient not taking: Reported on 12/25/2022)     pregabalin (LYRICA) 300 MG capsule Take 1 capsule (300 mg total) by mouth 2 (two) times daily. 60 capsule 3   No current facility-administered medications on file prior to visit.    BP (!) 168/96   Pulse (!) 107   Temp 98.4 F (36.9 C) (Temporal)   Ht 5\' 7"  (1.702 m)   Wt 166 lb (75.3 kg)   SpO2 98%   BMI 26.00 kg/m  Objective:   Physical Exam Cardiovascular:     Rate and Rhythm: Normal rate and regular rhythm.  Pulmonary:     Effort: Pulmonary effort is normal.     Breath sounds: Normal breath sounds.  No wheezing or rales.  Musculoskeletal:     Cervical back: Neck supple.  Skin:    General: Skin is warm and dry.  Neurological:     Mental Status: He is alert and oriented to person, place, and time.           Assessment & Plan:  Primary hypertension Assessment & Plan: Uncontrolled.   Increase amlodipine-benazepril to 10-40 mg. Also symptoms today suggestive of sleep apnea, which would be contributing to hypertension especially in the setting of weight gain.   Referral placed to Pulmonology for sleep study. Reviewed CMP from yesterday per oncology.  Send blood pressure readings via MyChart in 2 weeks.      Orders: -     amLODIPine Besy-Benazepril HCl; Take 1 capsule by mouth daily. for blood pressure.  Dispense: 90 capsule; Refill: 0 -     Ambulatory referral to Pulmonology  Snoring Assessment & Plan: Symptoms suspicious for sleep apnea.  STOP-BANG sleep apnea questionnaire today with score of 5 today.  Referral placed to pulmonology for sleep study.  Orders: -     Ambulatory referral to Pulmonology        Doreene Nest, NP

## 2022-12-27 LAB — MULTIPLE MYELOMA PANEL, SERUM
Albumin SerPl Elph-Mcnc: 3.2 g/dL (ref 2.9–4.4)
Albumin/Glob SerPl: 1.5 (ref 0.7–1.7)
Alpha 1: 0.2 g/dL (ref 0.0–0.4)
Alpha2 Glob SerPl Elph-Mcnc: 0.6 g/dL (ref 0.4–1.0)
B-Globulin SerPl Elph-Mcnc: 1 g/dL (ref 0.7–1.3)
Gamma Glob SerPl Elph-Mcnc: 0.4 g/dL (ref 0.4–1.8)
Globulin, Total: 2.2 g/dL (ref 2.2–3.9)
IgA: 69 mg/dL — ABNORMAL LOW (ref 90–386)
IgG (Immunoglobin G), Serum: 525 mg/dL — ABNORMAL LOW (ref 603–1613)
IgM (Immunoglobulin M), Srm: 33 mg/dL (ref 20–172)
Total Protein ELP: 5.4 g/dL — ABNORMAL LOW (ref 6.0–8.5)

## 2022-12-30 ENCOUNTER — Ambulatory Visit
Admission: RE | Admit: 2022-12-30 | Discharge: 2022-12-30 | Disposition: A | Discharge status: Home / Self Care | Payer: BC Managed Care – PPO | Source: Ambulatory Visit | Attending: Oncology | Admitting: Oncology

## 2022-12-30 DIAGNOSIS — R0602 Shortness of breath: Secondary | ICD-10-CM | POA: Diagnosis present

## 2022-12-30 DIAGNOSIS — C9000 Multiple myeloma not having achieved remission: Secondary | ICD-10-CM | POA: Diagnosis not present

## 2022-12-30 DIAGNOSIS — C9002 Multiple myeloma in relapse: Secondary | ICD-10-CM | POA: Insufficient documentation

## 2022-12-30 DIAGNOSIS — R5383 Other fatigue: Secondary | ICD-10-CM | POA: Insufficient documentation

## 2022-12-30 DIAGNOSIS — J984 Other disorders of lung: Secondary | ICD-10-CM | POA: Diagnosis not present

## 2022-12-30 DIAGNOSIS — R911 Solitary pulmonary nodule: Secondary | ICD-10-CM | POA: Diagnosis not present

## 2022-12-30 DIAGNOSIS — R739 Hyperglycemia, unspecified: Secondary | ICD-10-CM | POA: Insufficient documentation

## 2022-12-30 MED FILL — Dexamethasone Sodium Phosphate Inj 100 MG/10ML: INTRAMUSCULAR | Qty: 2 | Status: AC

## 2022-12-31 ENCOUNTER — Other Ambulatory Visit: Payer: BC Managed Care – PPO

## 2022-12-31 ENCOUNTER — Other Ambulatory Visit: Payer: Self-pay

## 2022-12-31 ENCOUNTER — Other Ambulatory Visit: Payer: Self-pay | Admitting: Primary Care

## 2022-12-31 ENCOUNTER — Inpatient Hospital Stay: Payer: BC Managed Care – PPO

## 2022-12-31 VITALS — BP 161/97 | HR 77 | Temp 97.3°F | Resp 18

## 2022-12-31 DIAGNOSIS — C9002 Multiple myeloma in relapse: Secondary | ICD-10-CM

## 2022-12-31 DIAGNOSIS — M62830 Muscle spasm of back: Secondary | ICD-10-CM

## 2022-12-31 DIAGNOSIS — Z79899 Other long term (current) drug therapy: Secondary | ICD-10-CM | POA: Diagnosis not present

## 2022-12-31 DIAGNOSIS — Z5112 Encounter for antineoplastic immunotherapy: Secondary | ICD-10-CM | POA: Diagnosis not present

## 2022-12-31 DIAGNOSIS — G8929 Other chronic pain: Secondary | ICD-10-CM

## 2022-12-31 LAB — CBC WITH DIFFERENTIAL (CANCER CENTER ONLY)
Abs Immature Granulocytes: 0.03 10*3/uL (ref 0.00–0.07)
Basophils Absolute: 0 10*3/uL (ref 0.0–0.1)
Basophils Relative: 1 %
Eosinophils Absolute: 0.3 10*3/uL (ref 0.0–0.5)
Eosinophils Relative: 6 %
HCT: 42.9 % (ref 39.0–52.0)
Hemoglobin: 15.2 g/dL (ref 13.0–17.0)
Immature Granulocytes: 1 %
Lymphocytes Relative: 28 %
Lymphs Abs: 1.4 10*3/uL (ref 0.7–4.0)
MCH: 32.5 pg (ref 26.0–34.0)
MCHC: 35.4 g/dL (ref 30.0–36.0)
MCV: 91.7 fL (ref 80.0–100.0)
Monocytes Absolute: 0.4 10*3/uL (ref 0.1–1.0)
Monocytes Relative: 8 %
Neutro Abs: 2.8 10*3/uL (ref 1.7–7.7)
Neutrophils Relative %: 56 %
Platelet Count: 201 10*3/uL (ref 150–400)
RBC: 4.68 MIL/uL (ref 4.22–5.81)
RDW: 13.5 % (ref 11.5–15.5)
WBC Count: 4.9 10*3/uL (ref 4.0–10.5)
nRBC: 0 % (ref 0.0–0.2)

## 2022-12-31 LAB — CMP (CANCER CENTER ONLY)
ALT: 19 U/L (ref 0–44)
AST: 23 U/L (ref 15–41)
Albumin: 3.9 g/dL (ref 3.5–5.0)
Alkaline Phosphatase: 83 U/L (ref 38–126)
Anion gap: 8 (ref 5–15)
BUN: 12 mg/dL (ref 6–20)
CO2: 24 mmol/L (ref 22–32)
Calcium: 8.7 mg/dL — ABNORMAL LOW (ref 8.9–10.3)
Chloride: 105 mmol/L (ref 98–111)
Creatinine: 1.38 mg/dL — ABNORMAL HIGH (ref 0.61–1.24)
GFR, Estimated: >60 mL/min (ref >60)
Glucose, Bld: 143 mg/dL — ABNORMAL HIGH (ref 70–99)
Potassium: 3.4 mmol/L — ABNORMAL LOW (ref 3.5–5.1)
Sodium: 137 mmol/L (ref 135–145)
Total Bilirubin: 0.8 mg/dL (ref 0.3–1.2)
Total Protein: 6.5 g/dL (ref 6.5–8.1)

## 2022-12-31 MED ORDER — HEPARIN SOD (PORK) LOCK FLUSH 100 UNIT/ML IV SOLN
500.0000 [IU] | Freq: Once | INTRAVENOUS | Status: AC | PRN
  Administered 2022-12-31: 500 [IU]
  Filled 2022-12-31: qty 5

## 2022-12-31 MED ORDER — SODIUM CHLORIDE 0.9 % IV SOLN
20.0000 mg | Freq: Once | INTRAVENOUS | Status: AC
  Administered 2022-12-31: 20 mg via INTRAVENOUS
  Filled 2022-12-31: qty 20

## 2022-12-31 MED ORDER — SODIUM CHLORIDE 0.9 % IV SOLN
Freq: Once | INTRAVENOUS | Status: AC
  Filled 2022-12-31: qty 250

## 2022-12-31 MED ORDER — BACLOFEN 10 MG PO TABS
10.0000 mg | ORAL_TABLET | Freq: Four times a day (QID) | ORAL | 0 refills | Status: DC | PRN
  Filled 2022-12-31: qty 360, 90d supply, fill #0

## 2022-12-31 MED ORDER — DEXTROSE 5 % IV SOLN
20.0000 mg/m2 | Freq: Once | INTRAVENOUS | Status: AC
  Administered 2022-12-31: 36 mg via INTRAVENOUS
  Filled 2022-12-31: qty 15

## 2022-12-31 NOTE — Patient Instructions (Signed)
[facility]  Discharge Instructions: Thank you for choosing [facility] to provide your oncology and hematology care.  If you have a lab appointment with the Cancer Center, please go directly to the Cancer Center and check in at the registration area.  Wear comfortable clothing and clothing appropriate for easy access to any Portacath or PICC line.   We strive to give you quality time with your provider. You may need to reschedule your appointment if you arrive late (15 or more minutes).  Arriving late affects you and other patients whose appointments are after yours.  Also, if you miss three or more appointments without notifying the office, you may be dismissed from the clinic at the provider's discretion.      For prescription refill requests, have your pharmacy contact our office and allow 72 hours for refills to be completed.    Today you received the following chemotherapy and/or immunotherapy agents KYPROLIS      To help prevent nausea and vomiting after your treatment, we encourage you to take your nausea medication as directed.  BELOW ARE SYMPTOMS THAT SHOULD BE REPORTED IMMEDIATELY: *FEVER GREATER THAN 100.4 F (38 C) OR HIGHER *CHILLS OR SWEATING *NAUSEA AND VOMITING THAT IS NOT CONTROLLED WITH YOUR NAUSEA MEDICATION *UNUSUAL SHORTNESS OF BREATH *UNUSUAL BRUISING OR BLEEDING *URINARY PROBLEMS (pain or burning when urinating, or frequent urination) *BOWEL PROBLEMS (unusual diarrhea, constipation, pain near the anus) TENDERNESS IN MOUTH AND THROAT WITH OR WITHOUT PRESENCE OF ULCERS (sore throat, sores in mouth, or a toothache) UNUSUAL RASH, SWELLING OR PAIN  UNUSUAL VAGINAL DISCHARGE OR ITCHING   Items with * indicate a potential emergency and should be followed up as soon as possible or go to the Emergency Department if any problems should occur.  Please show the CHEMOTHERAPY ALERT CARD or IMMUNOTHERAPY ALERT CARD at check-in to  the Emergency Department and triage nurse.  Should you have questions after your visit or need to cancel or reschedule your appointment, please contact [facility]  [phone] and follow the prompts.  Office hours are 8:00 a.m. to 4:30 p.m. Monday - Friday. Please note that voicemails left after 4:00 p.m. may not be returned until the following business day.  We are closed weekends and major holidays. You have access to a nurse at all times for urgent questions. Please call the main number to the clinic [phone] and follow the prompts.  For any non-urgent questions, you may also contact your provider using MyChart. We now offer e-Visits for anyone 44 and older to request care online for non-urgent symptoms. For details visit [email or web address].   Also download the MyChart app! Go to the app store, search "MyChart", open the app, select [place], and log in with your MyChart username and password.  Carfilzomib Injection What is this medication? CARFILZOMIB (kar FILZ oh mib) treats multiple myeloma, a type of bone marrow cancer. It works by blocking a protein that causes cancer cells to grow and multiply. This helps to slow or stop the spread of cancer cells. This medicine may be used for other purposes; ask your health care provider or pharmacist if you have questions. COMMON BRAND NAME(S): KYPROLIS What should I tell my care team before I take this medication? They need to know if you have any of these conditions: Heart disease History of blood clots Irregular heartbeat Kidney disease Liver disease Lung or breathing disease An unusual or allergic reaction to carfilzomib,  or other medications, foods, dyes, or preservatives If you or your partner are pregnant or trying to get pregnant Breastfeeding How should I use this medication? This medication is injected into a vein. It is given by your care team in a hospital or clinic setting. Talk to  your care team about the use of this medication in children. Special care may be needed. Overdosage: If you think you have taken too much of this medicine contact a poison control center or emergency room at once. NOTE: This medicine is only for you. Do not share this medicine with others. What if I miss a dose? Keep appointments for follow-up doses. It is important not to miss your dose. Call your care team if you are unable to keep an appointment. What may interact with this medication? Interactions are not expected. This list may not describe all possible interactions. Give your health care provider a list of all the medicines, herbs, non-prescription drugs, or dietary supplements you use. Also tell them if you smoke, drink alcohol, or use illegal drugs. Some items may interact with your medicine. What should I watch for while using this medication? Your condition will be monitored carefully while you are receiving this medication. You may need blood work while taking this medication. Check with your care team if you have severe diarrhea, nausea, and vomiting, or if you sweat a lot. The loss of too much body fluid may make it dangerous for you to take this medication. This medication may affect your coordination, reaction time, or judgment. Do not drive or operate machinery until you know how this medication affects you. Sit up or stand slowly to reduce the risk of dizzy or fainting spells. Drinking alcohol with this medication can increase the risk of these side effects. Talk to your care team if you may be pregnant. Serious birth defects can occur if you take this medication during pregnancy and for 6 months after the last dose. You will need a negative pregnancy test before starting this medication. Contraception is recommended while taking this medication and for 6 months after the last dose. Your care team can help you find an option that works for you. If your partner can get pregnant, use a  condom during sex while taking this medication and for 3 months after the last dose. Do not breastfeed while taking this medication and for 2 weeks after the last dose. This medication may cause infertility. Talk to your care team if you are concerned about your fertility. What side effects may I notice from receiving this medication? Side effects that you should report to your care team as soon as possible: Allergic reactions--skin rash, itching, hives, swelling of the face, lips, tongue, or throat Bleeding--bloody or black, tar-like stools, vomiting blood or brown material that looks like coffee grounds, red or dark brown urine, small red or purple spots on skin, unusual bruising or bleeding Blood clot--pain, swelling, or warmth in the leg, shortness of breath, chest pain Dizziness, loss of balance or coordination, confusion or trouble speaking Heart attack--pain or tightness in the chest, shoulders, arms, or jaw, nausea, shortness of breath, cold or clammy skin, feeling faint or lightheaded Heart failure--shortness of breath, swelling of the ankles, feet, or hands, sudden weight gain, unusual weakness or fatigue Heart rhythm changes--fast or irregular heartbeat, dizziness, feeling faint or lightheaded, chest pain, trouble breathing Increase in blood pressure Infection--fever, chills, cough, sore throat, wounds that don't heal, pain or trouble when passing urine, general feeling of discomfort  or being unwell Infusion reactions--chest pain, shortness of breath or trouble breathing, feeling faint or lightheaded Kidney injury--decrease in the amount of urine, swelling of the ankles, hands, or feet Liver injury--right upper belly pain, loss of appetite, nausea, light-colored stool, dark yellow or brown urine, yellowing skin or eyes, unusual weakness or fatigue Lung injury--shortness of breath or trouble breathing, cough, spitting up blood, chest pain, fever Pulmonary hypertension--shortness of  breath, chest pain, fast or irregular heartbeat, feeling faint or lightheaded, fatigue, swelling of the ankles or feet Stomach pain, bloody diarrhea, pale skin, unusual weakness or fatigue, decrease in the amount of urine, which may be signs of hemolytic uremic syndrome Sudden and severe headache, confusion, change in vision, seizures, which may be signs of posterior reversible encephalopathy syndrome (PRES) TTP--purple spots on the skin or inside the mouth, pale skin, yellowing skin or eyes, unusual weakness or fatigue, fever, fast or irregular heartbeat, confusion, change in vision, trouble speaking, trouble walking Tumor lysis syndrome (TLS)--nausea, vomiting, diarrhea, decrease in the amount of urine, dark urine, unusual weakness or fatigue, confusion, muscle pain or cramps, fast or irregular heartbeat, joint pain Side effects that usually do not require medical attention (report to your care team if they continue or are bothersome): Diarrhea Fatigue Nausea Trouble sleeping This list may not describe all possible side effects. Call your doctor for medical advice about side effects. You may report side effects to FDA at 1-800-FDA-1088. Where should I keep my medication? This medication is given in a hospital or clinic. It will not be stored at home. NOTE: This sheet is a summary. It may not cover all possible information. If you have questions about this medicine, talk to your doctor, pharmacist, or health care provider.  2024 Elsevier/Gold Standard (2021-09-06 00:00:00)

## 2023-01-06 MED FILL — Dexamethasone Sodium Phosphate Inj 100 MG/10ML: INTRAMUSCULAR | Qty: 2 | Status: AC

## 2023-01-07 ENCOUNTER — Inpatient Hospital Stay: Payer: BC Managed Care – PPO

## 2023-01-07 ENCOUNTER — Encounter: Payer: Self-pay | Admitting: Oncology

## 2023-01-07 ENCOUNTER — Other Ambulatory Visit: Payer: BC Managed Care – PPO

## 2023-01-07 ENCOUNTER — Inpatient Hospital Stay (HOSPITAL_BASED_OUTPATIENT_CLINIC_OR_DEPARTMENT_OTHER): Payer: BC Managed Care – PPO | Admitting: Oncology

## 2023-01-07 ENCOUNTER — Other Ambulatory Visit: Payer: Self-pay

## 2023-01-07 VITALS — BP 142/108 | HR 94 | Temp 97.1°F | Resp 18 | Ht 67.0 in | Wt 166.3 lb

## 2023-01-07 DIAGNOSIS — Z79899 Other long term (current) drug therapy: Secondary | ICD-10-CM | POA: Diagnosis not present

## 2023-01-07 DIAGNOSIS — C9002 Multiple myeloma in relapse: Secondary | ICD-10-CM

## 2023-01-07 DIAGNOSIS — Z5112 Encounter for antineoplastic immunotherapy: Secondary | ICD-10-CM | POA: Diagnosis not present

## 2023-01-07 DIAGNOSIS — Z5111 Encounter for antineoplastic chemotherapy: Secondary | ICD-10-CM

## 2023-01-07 LAB — CBC WITH DIFFERENTIAL (CANCER CENTER ONLY)
Abs Immature Granulocytes: 0.05 10*3/uL (ref 0.00–0.07)
Basophils Absolute: 0 10*3/uL (ref 0.0–0.1)
Basophils Relative: 1 %
Eosinophils Absolute: 0.3 10*3/uL (ref 0.0–0.5)
Eosinophils Relative: 4 %
HCT: 43.5 % (ref 39.0–52.0)
Hemoglobin: 15.5 g/dL (ref 13.0–17.0)
Immature Granulocytes: 1 %
Lymphocytes Relative: 19 %
Lymphs Abs: 1.2 10*3/uL (ref 0.7–4.0)
MCH: 32.8 pg (ref 26.0–34.0)
MCHC: 35.6 g/dL (ref 30.0–36.0)
MCV: 92.2 fL (ref 80.0–100.0)
Monocytes Absolute: 0.4 10*3/uL (ref 0.1–1.0)
Monocytes Relative: 7 %
Neutro Abs: 4.5 10*3/uL (ref 1.7–7.7)
Neutrophils Relative %: 68 %
Platelet Count: 195 10*3/uL (ref 150–400)
RBC: 4.72 MIL/uL (ref 4.22–5.81)
RDW: 13.3 % (ref 11.5–15.5)
WBC Count: 6.5 10*3/uL (ref 4.0–10.5)
nRBC: 0 % (ref 0.0–0.2)

## 2023-01-07 LAB — CMP (CANCER CENTER ONLY)
ALT: 24 U/L (ref 0–44)
AST: 25 U/L (ref 15–41)
Albumin: 3.7 g/dL (ref 3.5–5.0)
Alkaline Phosphatase: 88 U/L (ref 38–126)
Anion gap: 6 (ref 5–15)
BUN: 17 mg/dL (ref 6–20)
CO2: 23 mmol/L (ref 22–32)
Calcium: 8.8 mg/dL — ABNORMAL LOW (ref 8.9–10.3)
Chloride: 106 mmol/L (ref 98–111)
Creatinine: 1.41 mg/dL — ABNORMAL HIGH (ref 0.61–1.24)
GFR, Estimated: 60 mL/min — ABNORMAL LOW (ref >60)
Glucose, Bld: 155 mg/dL — ABNORMAL HIGH (ref 70–99)
Potassium: 3.7 mmol/L (ref 3.5–5.1)
Sodium: 135 mmol/L (ref 135–145)
Total Bilirubin: 0.7 mg/dL (ref 0.3–1.2)
Total Protein: 6.3 g/dL — ABNORMAL LOW (ref 6.5–8.1)

## 2023-01-07 MED ORDER — SODIUM CHLORIDE 0.9 % IV SOLN
Freq: Once | INTRAVENOUS | Status: AC
  Filled 2023-01-07: qty 250

## 2023-01-07 MED ORDER — HEPARIN SOD (PORK) LOCK FLUSH 100 UNIT/ML IV SOLN
500.0000 [IU] | Freq: Once | INTRAVENOUS | Status: AC | PRN
  Administered 2023-01-07: 500 [IU]
  Filled 2023-01-07: qty 5

## 2023-01-07 MED ORDER — XTAMPZA ER 9 MG PO C12A
9.0000 mg | EXTENDED_RELEASE_CAPSULE | Freq: Two times a day (BID) | ORAL | 0 refills | Status: DC
  Filled 2023-01-07: qty 60, 30d supply, fill #0

## 2023-01-07 MED ORDER — SODIUM CHLORIDE 0.9 % IV SOLN
10.0000 mg | Freq: Once | INTRAVENOUS | Status: AC
  Administered 2023-01-07: 10 mg via INTRAVENOUS
  Filled 2023-01-07: qty 10

## 2023-01-07 MED ORDER — DEXTROSE 5 % IV SOLN
20.0000 mg/m2 | Freq: Once | INTRAVENOUS | Status: AC
  Administered 2023-01-07: 36 mg via INTRAVENOUS
  Filled 2023-01-07: qty 15

## 2023-01-07 MED ORDER — OXYCODONE HCL 10 MG PO TABS
10.0000 mg | ORAL_TABLET | Freq: Three times a day (TID) | ORAL | 0 refills | Status: DC | PRN
  Filled 2023-01-07 – 2023-01-08 (×2): qty 90, 30d supply, fill #0

## 2023-01-07 NOTE — Progress Notes (Signed)
Hematology/Oncology Consult note [facility]  Telephone:([phone] [phone]  Patient Care Team: Doreene Nest, NP as PCP - General (Internal Medicine) Martin Majestic, MD as PCP - Hematology/Oncology Creig Hines, MD as Consulting Physician (Hematology and Oncology)   Name of the patient: James House  [id number]  [birth year]   Date of visit: 01/07/23  Diagnosis- kappa light chain multiple myeloma in relapse   Chief complaint/ Reason for visit-on treatment assessment prior to cycle 6-day 15 of carfilzomib  Heme/Onc history: Patient is a 52- year-old male with a history of kappa light chain myeloma that was treated by [name].  History is as follows:   1.  Patient presented with renal insufficiency with a creatinine of 2.4 in April 2018.  Kidney biopsy showed myeloma cast nephropathy.  Kappa light chain was 3004 and lambda 0.22 with a kappa lambda ratio of 13,000 655.  Skeletal survey showed multiple calvarial and marrow lesions.  Bone marrow biopsy in May 2018 showed 43% plasma cells.  He received CyBorD for cycle 1 in May 2018 and was subsequently switched to RVD.  He received 4 cycles followed by a repeat bone marrow biopsy in August 2018 which showed no increase in plasma cells.   2.  He underwent autologous stem cell transplantation on 01/30/2017.  He was started on Revlimid maintenance 10 mg daily in January 2019.   3.  Labs from January February and March 2019 done at [facility] showed no M spike, normal kappa lambda ratio of 1.06.  He was last seen by them in April 2019.   4. Following that patient was admitted to [facility] for healthcare associated pneumonia and was recently discharged.  He wished to transfer his care to [place] at this time.  He was on maintenance Revlimid 5 mg which was then reduced to 2.5 mg.  Follows up with pain clinic for chemo-induced peripheral neuropathy with [name].  His urine tested positive for [facility] and therefore  patient was discharged from pain clinic   5.  Noted to have gradually increasing free kappa light chainFrom 15 in 20 21-39.5 with a ratio of 3.09.  He was seen by [facility] again and underwent a repeat bone marrow biopsy which did not show any evidence of clonal plasma cells.  PET CT scan also did not show any new active lytic lesions in May 2023.Marland Kitchen  Revlimid dose was increased to 15 mg 3 weeks on 1 week off but kappa light chains show continuing to increase   6.  PET CT scan in December 2023 did not show any evidence of active myeloma.  Patient underwent repeat bone marrow biopsy at [facility] which showed 6% plasma cellsConsistent with persistent plasma cell neoplasm.  74 to 86% of isolated plasma cells show loss of T p53 and monosomy 13.  Patient was seen by [name] at [facility] and was recommended Darzalex Pomalyst dexamethasone regimen   7.  Disease progression after 8 weekly cycles of Darazalex kappa light chain increased from 332-646 with a ratio that went up from 39-57 concerning for disease progression.  Patient switched to carfilzomib Pomalyst dexamethasone regimen    Interval history-patient is concerned about his weight gain.  He was 154 pounds in April 2024 and is presently at 166 pounds.  Systolic blood pressures have been between 1 40-1 60 at home and patient's amlodipine was recently increased to 10 mg.  He has not brought his blood pressure log from home.  Reports fatigue  and exertional shortness of breath.  ECOG PS- 1 Pain scale- 3 Opioid associated constipation- no  Review of systems- Review of Systems  Constitutional:  Positive for malaise/fatigue. Negative for chills, fever and weight loss.  HENT:  Negative for congestion, ear discharge and nosebleeds.   Eyes:  Negative for blurred vision.  Respiratory:  Positive for shortness of breath. Negative for cough, hemoptysis, sputum production and wheezing.   Cardiovascular:  Negative for chest pain, palpitations, orthopnea and claudication.   Gastrointestinal:  Negative for abdominal pain, blood in stool, constipation, diarrhea, heartburn, melena, nausea and vomiting.  Genitourinary:  Negative for dysuria, flank pain, frequency, hematuria and urgency.  Musculoskeletal:  Positive for back pain. Negative for joint pain and myalgias.  Skin:  Negative for rash.  Neurological:  Negative for dizziness, tingling, focal weakness, seizures, weakness and headaches.  Endo/Heme/Allergies:  Does not bruise/bleed easily.  Psychiatric/Behavioral:  Negative for depression and suicidal ideas. The patient does not have insomnia.       No Known Allergies   Past Medical History:  Diagnosis Date   CKD (chronic kidney disease) stage 3, GFR 30-59 ml/min (HCC)    Elevated sedimentation rate 12/24/2017   Hypertension    Multiple myeloma (HCC) 08/11/2017   Neuropathy    Pneumonia    Severe sepsis (HCC) 08/11/2017   Shingles 08/03/2019     Past Surgical History:  Procedure Laterality Date   ABDOMINAL SURGERY     COLONOSCOPY WITH PROPOFOL N/A 05/25/2021   Procedure: COLONOSCOPY WITH PROPOFOL;  Surgeon: Toney Reil, MD;  Location: [facility] ENDOSCOPY;  Service: Gastroenterology;  Laterality: N/A;   IR IMAGING GUIDED PORT INSERTION  08/02/2022   JOINT REPLACEMENT     right knee   KNEE SURGERY Left     Social History   Socioeconomic History   Marital status: Single    Spouse name: [name]   Number of children: 3   Years of education: Not on file   Highest education level: Not on file  Occupational History   Not on file  Tobacco Use   Smoking status: Former   Smokeless tobacco: Current    Types: Snuff  Vaping Use   Vaping status: Never Used  Substance and Sexual Activity   Alcohol use: No   Drug use: No   Sexual activity: Yes  Other Topics Concern   Not on file  Social History Narrative   Live in private residence with spouse and mother in law   Social Determinants of Health   Financial Resource Strain: Low Risk   (08/12/2017)   Overall Financial Resource Strain (CARDIA)    Difficulty of Paying Living Expenses: Not hard at all  Food Insecurity: No Food Insecurity (08/12/2017)   Hunger Vital Sign    Worried About Running Out of Food in the Last Year: Never true    Ran Out of Food in the Last Year: Never true  Transportation Needs: No Transportation Needs (08/12/2017)   PRAPARE - Administrator, Civil Service (Medical): No    Lack of Transportation (Non-Medical): No  Physical Activity: Sufficiently Active (08/12/2017)   Exercise Vital Sign    Days of Exercise per Week: 7 days    Minutes of Exercise per Session: 30 min  Stress: No Stress Concern Present (08/12/2017)   [employer] of Occupational Health - Occupational Stress Questionnaire    Feeling of Stress : Only a little  Social Connections: Somewhat Isolated (08/12/2017)   Social Connection and Isolation Panel [NHANES]  Frequency of Communication with Friends and Family: Once a week    Frequency of Social Gatherings with Friends and Family: Once a week    Attends Religious Services: More than 4 times per year    Active Member of [employer] or Organizations: No    Attends Banker Meetings: Never    Marital Status: Married  Catering manager Violence: Not At Risk (08/12/2017)   Humiliation, Afraid, Rape, and Kick questionnaire    Fear of Current or Ex-Partner: No    Emotionally Abused: No    Physically Abused: No    Sexually Abused: No    Family History  Problem Relation Age of Onset   Cancer Mother 53       Pancreatic   COPD Mother    Diabetes Mother    Hyperlipidemia Mother    Hypertension Mother    Cancer Maternal Uncle        pancreatic   Cancer Maternal Grandmother 48       colon   COPD Maternal Grandmother    Diabetes Maternal Grandmother    Hypertension Maternal Grandmother      Current Outpatient Medications:    acyclovir (ZOVIRAX) 400 MG tablet, Take 1 tablet (400 mg total) by mouth 2 (two) times  daily., Disp: 60 tablet, Rfl: 2   albuterol (VENTOLIN HFA) 108 (90 Base) MCG/ACT inhaler, Inhale 2 puffs into the lungs every 6 (six) hours as needed for wheezing or shortness of breath., Disp: 6.7 g, Rfl: 2   amitriptyline (ELAVIL) 25 MG tablet, Take 1 tablet (25 mg total) by mouth at bedtime. For headache prevention and sleep, Disp: 90 tablet, Rfl: 0   amLODipine-benazepril (LOTREL) 10-40 MG capsule, Take 1 capsule by mouth daily. for blood pressure., Disp: 90 capsule, Rfl: 0   aspirin EC 81 MG tablet, Take by mouth., Disp: , Rfl:    baclofen (LIORESAL) 10 MG tablet, Take 1 tablet (10 mg total) by mouth 4 (four) times daily as needed for muscle spasms., Disp: 360 tablet, Rfl: 0   celecoxib (CELEBREX) 200 MG capsule, Take 1 capsule (200 mg total) by mouth daily., Disp: 30 capsule, Rfl: 2   cyanocobalamin (VITAMIN B12) 1000 MCG/ML injection, Inject 1 mL (1,000 mcg total) into the muscle every 30 (thirty) days., Disp: 1 mL, Rfl: 11   levothyroxine (SYNTHROID) 50 MCG tablet, Take 1 tablet (50 mcg total) by mouth daily before breakfast., Disp: 30 tablet, Rfl: 2   lidocaine-prilocaine (EMLA) cream, Apply 1 Application topically as needed., Disp: 30 g, Rfl: 0   montelukast (SINGULAIR) 10 MG tablet, Take 1 tablet in the evening 1 day before your treatment. Day of treatment take 1 tablet in the morning for the first 2 treatments of Daratumumab, Disp: 4 tablet, Rfl: 0   OLANZapine (ZYPREXA) 10 MG tablet, Take 1 tablet (10 mg total) by mouth at bedtime. To help with nausea prevention., Disp: 30 tablet, Rfl: 1   oxyCODONE ER (XTAMPZA ER) 9 MG C12A, Take 9 mg by mouth every 12 (twelve) hours., Disp: 60 capsule, Rfl: 0   Oxycodone HCl 10 MG TABS, Take 1 tablet (10 mg total) by mouth every 8 (eight) hours as needed., Disp: 90 tablet, Rfl: 0   POMALYST 2 MG capsule, TAKE 1 CAPSULE DAILY FOR 21 DAYS AND THEN 7 DAYS OFF, Disp: 21 capsule, Rfl: 0   potassium chloride SA (KLOR-CON M) 20 MEQ tablet, Take 2 tablets (40  mEq total) by mouth 2 (two) times daily., Disp: 120 tablet, Rfl: 0  prochlorperazine (COMPAZINE) 10 MG tablet, Take by mouth., Disp: , Rfl:    amoxicillin-clavulanate (AUGMENTIN) 875-125 MG tablet, Take 1 tablet by mouth 2 (two) times daily. (Patient not taking: Reported on 11/28/2022), Disp: 14 tablet, Rfl: 0   dexamethasone (DECADRON) 4 MG tablet, Take 5 tablets (20 mg total) by mouth once a week. Take first dose 1 hour prior to reporting to clinic for daratumumab. Take 2nd dose the day after daratumumab. (Patient not taking: Reported on 12/25/2022), Disp: 20 tablet, Rfl: 2   DULoxetine (CYMBALTA) 60 MG capsule, Take 1 capsule (60 mg total) by mouth daily., Disp: 60 capsule, Rfl: 1   OLANZapine (ZYPREXA) 10 MG tablet, Take by mouth. (Patient not taking: Reported on 12/25/2022), Disp: , Rfl:    pregabalin (LYRICA) 300 MG capsule, Take 1 capsule (300 mg total) by mouth 2 (two) times daily., Disp: 60 capsule, Rfl: 3  Physical exam:  Vitals:   01/07/23 0846  BP: (!) 147/105  Pulse: 94  Resp: 18  Temp: (!) 97.1 F (36.2 C)  TempSrc: Tympanic  SpO2: 100%  Weight: 166 lb 4.8 oz (75.4 kg)   Physical Exam Cardiovascular:     Rate and Rhythm: Normal rate and regular rhythm.     Heart sounds: Normal heart sounds.  Pulmonary:     Effort: Pulmonary effort is normal.     Breath sounds: Normal breath sounds.  Skin:    General: Skin is warm and dry.  Neurological:     Mental Status: He is alert and oriented to person, place, and time.         Latest Ref Rng & Units 01/07/2023    8:25 AM  CMP  Glucose 70 - 99 mg/dL 161   BUN 6 - 20 mg/dL 17   Creatinine 0.96 - 1.24 mg/dL 0.45   Sodium 409 - 811 mmol/L 135   Potassium 3.5 - 5.1 mmol/L 3.7   Chloride 98 - 111 mmol/L 106   CO2 22 - 32 mmol/L 23   Calcium 8.9 - 10.3 mg/dL 8.8   Total Protein 6.5 - 8.1 g/dL 6.3   Total Bilirubin 0.3 - 1.2 mg/dL 0.7   Alkaline Phos 38 - 126 U/L 88   AST 15 - 41 U/L 25   ALT 0 - 44 U/L 24       Latest Ref  Rng & Units 01/07/2023    8:25 AM  CBC  WBC 4.0 - 10.5 K/uL 6.5   Hemoglobin 13.0 - 17.0 g/dL 91.4   Hematocrit 78.2 - 52.0 % 43.5   Platelets 150 - 400 K/uL 195     No images are attached to the encounter.  No results found.   Assessment and plan- Patient is a 52 y.o. male with history of kappa light chain multiple myeloma currently in partial remission.  He is here for on treatment assessment prior to cycle 6-day 15 of carfilzomib  Counts okay to proceed with cycle 6-day 15 of carfilzomib today.  His blood pressure in our office today is elevated at 147/105.  Amlodipine was recently increased to 10 mg.  Patient has not brought his blood pressure log with him today and I have asked him to send me a log in 4 days.  If blood pressure remains more than 140/90 despite increasing amlodipine, I will consider adding hydrochlorothiazide to his regimen and inform his primary care doctor about this as well.  He has a cardiology appointment coming up next month.  He had an echocardiogram in  [facility] on 12/11/2022 which showed concentric LVH with a normal EF.  He is also being evaluated for obstructive sleep apnea.  Continue Pomalyst 2 mg 3 weeks on and 1 week off  Weight gain: Likely secondary to carfilzomib.  Neoplasm related pain: I have renewed OxyContin and oxycodone   Visit Diagnosis 1. Multiple myeloma in relapse (HCC)   2. Encounter for antineoplastic chemotherapy   3. High risk medication use      Dr. Owens Shark, MD, MPH [facility] at [facility] [phone] 01/07/2023 9:10 AM

## 2023-01-07 NOTE — Patient Instructions (Signed)
[facility] [facility]  Discharge Instructions: Thank you for choosing [facility] to provide your oncology and hematology care.  If you have a lab appointment with the Cancer Center, please go directly to the Cancer Center and check in at the registration area.  Wear comfortable clothing and clothing appropriate for easy access to any Portacath or PICC line.   We strive to give you quality time with your provider. You may need to reschedule your appointment if you arrive late (15 or more minutes).  Arriving late affects you and other patients whose appointments are after yours.  Also, if you miss three or more appointments without notifying the office, you may be dismissed from the clinic at the provider's discretion.      For prescription refill requests, have your pharmacy contact our office and allow 72 hours for refills to be completed.    Today you received the following chemotherapy and/or immunotherapy agents KYPROLIS      To help prevent nausea and vomiting after your treatment, we encourage you to take your nausea medication as directed.  BELOW ARE SYMPTOMS THAT SHOULD BE REPORTED IMMEDIATELY: *FEVER GREATER THAN 100.4 F (38 C) OR HIGHER *CHILLS OR SWEATING *NAUSEA AND VOMITING THAT IS NOT CONTROLLED WITH YOUR NAUSEA MEDICATION *UNUSUAL SHORTNESS OF BREATH *UNUSUAL BRUISING OR BLEEDING *URINARY PROBLEMS (pain or burning when urinating, or frequent urination) *BOWEL PROBLEMS (unusual diarrhea, constipation, pain near the anus) TENDERNESS IN MOUTH AND THROAT WITH OR WITHOUT PRESENCE OF ULCERS (sore throat, sores in mouth, or a toothache) UNUSUAL RASH, SWELLING OR PAIN  UNUSUAL VAGINAL DISCHARGE OR ITCHING   Items with * indicate a potential emergency and should be followed up as soon as possible or go to the Emergency Department if any problems should occur.  Please show the CHEMOTHERAPY ALERT CARD or IMMUNOTHERAPY ALERT CARD at check-in to  the Emergency Department and triage nurse.  Should you have questions after your visit or need to cancel or reschedule your appointment, please contact [place] [facility]  [phone] and follow the prompts.  Office hours are 8:00 a.m. to 4:30 p.m. Monday - Friday. Please note that voicemails left after 4:00 p.m. may not be returned until the following business day.  We are closed weekends and major holidays. You have access to a nurse at all times for urgent questions. Please call the main number to the clinic [phone] and follow the prompts.  For any non-urgent questions, you may also contact your provider using MyChart. We now offer e-Visits for anyone 24 and older to request care online for non-urgent symptoms. For details visit [email or web address].   Also download the MyChart app! Go to the app store, search "MyChart", open the app, select [facility], and log in with your MyChart username and password.  Carfilzomib Injection What is this medication? CARFILZOMIB (kar FILZ oh mib) treats multiple myeloma, a type of bone marrow cancer. It works by blocking a protein that causes cancer cells to grow and multiply. This helps to slow or stop the spread of cancer cells. This medicine may be used for other purposes; ask your health care provider or pharmacist if you have questions. COMMON BRAND NAME(S): KYPROLIS What should I tell my care team before I take this medication? They need to know if you have any of these conditions: Heart disease History of blood clots Irregular heartbeat Kidney disease Liver disease Lung or breathing disease An unusual or allergic reaction to carfilzomib,  or other medications, foods, dyes, or preservatives If you or your partner are pregnant or trying to get pregnant Breastfeeding How should I use this medication? This medication is injected into a vein. It is given by your care team in a hospital or clinic setting. Talk to  your care team about the use of this medication in children. Special care may be needed. Overdosage: If you think you have taken too much of this medicine contact a poison control center or emergency room at once. NOTE: This medicine is only for you. Do not share this medicine with others. What if I miss a dose? Keep appointments for follow-up doses. It is important not to miss your dose. Call your care team if you are unable to keep an appointment. What may interact with this medication? Interactions are not expected. This list may not describe all possible interactions. Give your health care provider a list of all the medicines, herbs, non-prescription drugs, or dietary supplements you use. Also tell them if you smoke, drink alcohol, or use illegal drugs. Some items may interact with your medicine. What should I watch for while using this medication? Your condition will be monitored carefully while you are receiving this medication. You may need blood work while taking this medication. Check with your care team if you have severe diarrhea, nausea, and vomiting, or if you sweat a lot. The loss of too much body fluid may make it dangerous for you to take this medication. This medication may affect your coordination, reaction time, or judgment. Do not drive or operate machinery until you know how this medication affects you. Sit up or stand slowly to reduce the risk of dizzy or fainting spells. Drinking alcohol with this medication can increase the risk of these side effects. Talk to your care team if you may be pregnant. Serious birth defects can occur if you take this medication during pregnancy and for 6 months after the last dose. You will need a negative pregnancy test before starting this medication. Contraception is recommended while taking this medication and for 6 months after the last dose. Your care team can help you find an option that works for you. If your partner can get pregnant, use a  condom during sex while taking this medication and for 3 months after the last dose. Do not breastfeed while taking this medication and for 2 weeks after the last dose. This medication may cause infertility. Talk to your care team if you are concerned about your fertility. What side effects may I notice from receiving this medication? Side effects that you should report to your care team as soon as possible: Allergic reactions--skin rash, itching, hives, swelling of the face, lips, tongue, or throat Bleeding--bloody or black, tar-like stools, vomiting blood or brown material that looks like coffee grounds, red or dark brown urine, small red or purple spots on skin, unusual bruising or bleeding Blood clot--pain, swelling, or warmth in the leg, shortness of breath, chest pain Dizziness, loss of balance or coordination, confusion or trouble speaking Heart attack--pain or tightness in the chest, shoulders, arms, or jaw, nausea, shortness of breath, cold or clammy skin, feeling faint or lightheaded Heart failure--shortness of breath, swelling of the ankles, feet, or hands, sudden weight gain, unusual weakness or fatigue Heart rhythm changes--fast or irregular heartbeat, dizziness, feeling faint or lightheaded, chest pain, trouble breathing Increase in blood pressure Infection--fever, chills, cough, sore throat, wounds that don't heal, pain or trouble when passing urine, general feeling of discomfort  or being unwell Infusion reactions--chest pain, shortness of breath or trouble breathing, feeling faint or lightheaded Kidney injury--decrease in the amount of urine, swelling of the ankles, hands, or feet Liver injury--right upper belly pain, loss of appetite, nausea, light-colored stool, dark yellow or brown urine, yellowing skin or eyes, unusual weakness or fatigue Lung injury--shortness of breath or trouble breathing, cough, spitting up blood, chest pain, fever Pulmonary hypertension--shortness of  breath, chest pain, fast or irregular heartbeat, feeling faint or lightheaded, fatigue, swelling of the ankles or feet Stomach pain, bloody diarrhea, pale skin, unusual weakness or fatigue, decrease in the amount of urine, which may be signs of hemolytic uremic syndrome Sudden and severe headache, confusion, change in vision, seizures, which may be signs of posterior reversible encephalopathy syndrome (PRES) TTP--purple spots on the skin or inside the mouth, pale skin, yellowing skin or eyes, unusual weakness or fatigue, fever, fast or irregular heartbeat, confusion, change in vision, trouble speaking, trouble walking Tumor lysis syndrome (TLS)--nausea, vomiting, diarrhea, decrease in the amount of urine, dark urine, unusual weakness or fatigue, confusion, muscle pain or cramps, fast or irregular heartbeat, joint pain Side effects that usually do not require medical attention (report to your care team if they continue or are bothersome): Diarrhea Fatigue Nausea Trouble sleeping This list may not describe all possible side effects. Call your doctor for medical advice about side effects. You may report side effects to FDA at 1-800-FDA-1088. Where should I keep my medication? This medication is given in a hospital or clinic. It will not be stored at home. NOTE: This sheet is a summary. It may not cover all possible information. If you have questions about this medicine, talk to your doctor, pharmacist, or health care provider.  2024 Elsevier/Gold Standard (2021-09-06 00:00:00)

## 2023-01-08 ENCOUNTER — Other Ambulatory Visit: Payer: Self-pay

## 2023-01-08 ENCOUNTER — Encounter: Payer: Self-pay | Admitting: Oncology

## 2023-01-17 ENCOUNTER — Other Ambulatory Visit: Payer: Self-pay | Admitting: Oncology

## 2023-01-20 MED FILL — Dexamethasone Sodium Phosphate Inj 100 MG/10ML: INTRAMUSCULAR | Qty: 1 | Status: AC

## 2023-01-21 ENCOUNTER — Inpatient Hospital Stay: Payer: BC Managed Care – PPO | Attending: Internal Medicine

## 2023-01-21 ENCOUNTER — Inpatient Hospital Stay: Payer: BC Managed Care – PPO

## 2023-01-21 VITALS — BP 151/97 | HR 85 | Temp 97.8°F | Wt 167.4 lb

## 2023-01-21 DIAGNOSIS — Z79899 Other long term (current) drug therapy: Secondary | ICD-10-CM | POA: Diagnosis not present

## 2023-01-21 DIAGNOSIS — C9002 Multiple myeloma in relapse: Secondary | ICD-10-CM

## 2023-01-21 DIAGNOSIS — Z5112 Encounter for antineoplastic immunotherapy: Secondary | ICD-10-CM | POA: Insufficient documentation

## 2023-01-21 LAB — CBC WITH DIFFERENTIAL (CANCER CENTER ONLY)
Abs Immature Granulocytes: 0.03 10*3/uL (ref 0.00–0.07)
Basophils Absolute: 0 10*3/uL (ref 0.0–0.1)
Basophils Relative: 1 %
Eosinophils Absolute: 0.2 10*3/uL (ref 0.0–0.5)
Eosinophils Relative: 4 %
HCT: 41.5 % (ref 39.0–52.0)
Hemoglobin: 14.4 g/dL (ref 13.0–17.0)
Immature Granulocytes: 1 %
Lymphocytes Relative: 28 %
Lymphs Abs: 1.3 10*3/uL (ref 0.7–4.0)
MCH: 32.4 pg (ref 26.0–34.0)
MCHC: 34.7 g/dL (ref 30.0–36.0)
MCV: 93.5 fL (ref 80.0–100.0)
Monocytes Absolute: 0.4 10*3/uL (ref 0.1–1.0)
Monocytes Relative: 8 %
Neutro Abs: 2.7 10*3/uL (ref 1.7–7.7)
Neutrophils Relative %: 58 %
Platelet Count: 190 10*3/uL (ref 150–400)
RBC: 4.44 MIL/uL (ref 4.22–5.81)
RDW: 13 % (ref 11.5–15.5)
WBC Count: 4.6 10*3/uL (ref 4.0–10.5)
nRBC: 0 % (ref 0.0–0.2)

## 2023-01-21 LAB — CMP (CANCER CENTER ONLY)
ALT: 23 U/L (ref 0–44)
AST: 25 U/L (ref 15–41)
Albumin: 3.6 g/dL (ref 3.5–5.0)
Alkaline Phosphatase: 78 U/L (ref 38–126)
Anion gap: 10 (ref 5–15)
BUN: 16 mg/dL (ref 6–20)
CO2: 25 mmol/L (ref 22–32)
Calcium: 9.1 mg/dL (ref 8.9–10.3)
Chloride: 104 mmol/L (ref 98–111)
Creatinine: 1.41 mg/dL — ABNORMAL HIGH (ref 0.61–1.24)
GFR, Estimated: 60 mL/min — ABNORMAL LOW (ref >60)
Glucose, Bld: 151 mg/dL — ABNORMAL HIGH (ref 70–99)
Potassium: 3.5 mmol/L (ref 3.5–5.1)
Sodium: 139 mmol/L (ref 135–145)
Total Bilirubin: 0.7 mg/dL (ref 0.3–1.2)
Total Protein: 6.3 g/dL — ABNORMAL LOW (ref 6.5–8.1)

## 2023-01-21 LAB — BRAIN NATRIURETIC PEPTIDE: B Natriuretic Peptide: 27.9 pg/mL (ref 0.0–100.0)

## 2023-01-21 MED ORDER — DEXTROSE 5 % IV SOLN
20.0000 mg/m2 | Freq: Once | INTRAVENOUS | Status: AC
  Administered 2023-01-21: 36 mg via INTRAVENOUS
  Filled 2023-01-21: qty 15

## 2023-01-21 MED ORDER — HEPARIN SOD (PORK) LOCK FLUSH 100 UNIT/ML IV SOLN
500.0000 [IU] | Freq: Once | INTRAVENOUS | Status: AC | PRN
  Administered 2023-01-21: 500 [IU]
  Filled 2023-01-21: qty 5

## 2023-01-21 MED ORDER — SODIUM CHLORIDE 0.9 % IV SOLN
Freq: Once | INTRAVENOUS | Status: AC
  Filled 2023-01-21: qty 250

## 2023-01-21 MED ORDER — SODIUM CHLORIDE 0.9 % IV SOLN
10.0000 mg | Freq: Once | INTRAVENOUS | Status: AC
  Administered 2023-01-21: 10 mg via INTRAVENOUS
  Filled 2023-01-21: qty 10

## 2023-01-21 NOTE — Patient Instructions (Signed)
[facility] [place] [facility]  Discharge Instructions: Thank you for choosing [facility] to provide your oncology and hematology care.  If you have a lab appointment with the Cancer Center, please go directly to the Cancer Center and check in at the registration area.  Wear comfortable clothing and clothing appropriate for easy access to any Portacath or PICC line.   We strive to give you quality time with your provider. You may need to reschedule your appointment if you arrive late (15 or more minutes).  Arriving late affects you and other patients whose appointments are after yours.  Also, if you miss three or more appointments without notifying the office, you may be dismissed from the clinic at the provider's discretion.      For prescription refill requests, have your pharmacy contact our office and allow 72 hours for refills to be completed.    Today you received the following chemotherapy and/or immunotherapy agents Carfilzomib.      To help prevent nausea and vomiting after your treatment, we encourage you to take your nausea medication as directed.  BELOW ARE SYMPTOMS THAT SHOULD BE REPORTED IMMEDIATELY: *FEVER GREATER THAN 100.4 F (38 C) OR HIGHER *CHILLS OR SWEATING *NAUSEA AND VOMITING THAT IS NOT CONTROLLED WITH YOUR NAUSEA MEDICATION *UNUSUAL SHORTNESS OF BREATH *UNUSUAL BRUISING OR BLEEDING *URINARY PROBLEMS (pain or burning when urinating, or frequent urination) *BOWEL PROBLEMS (unusual diarrhea, constipation, pain near the anus) TENDERNESS IN MOUTH AND THROAT WITH OR WITHOUT PRESENCE OF ULCERS (sore throat, sores in mouth, or a toothache) UNUSUAL RASH, SWELLING OR PAIN  UNUSUAL VAGINAL DISCHARGE OR ITCHING   Items with * indicate a potential emergency and should be followed up as soon as possible or go to the Emergency Department if any problems should occur.  Please show the CHEMOTHERAPY ALERT CARD or IMMUNOTHERAPY ALERT CARD at check-in  to the Emergency Department and triage nurse.  Should you have questions after your visit or need to cancel or reschedule your appointment, please contact [facility] [place] [facility]  [phone] and follow the prompts.  Office hours are 8:00 a.m. to 4:30 p.m. Monday - Friday. Please note that voicemails left after 4:00 p.m. may not be returned until the following business day.  We are closed weekends and major holidays. You have access to a nurse at all times for urgent questions. Please call the main number to the clinic [phone] and follow the prompts.  For any non-urgent questions, you may also contact your provider using MyChart. We now offer e-Visits for anyone 18 and older to request care online for non-urgent symptoms. For details visit [email or web address].   Also download the MyChart app! Go to the app store, search "MyChart", open the app, select [facility], and log in with your MyChart username and password.    

## 2023-01-22 LAB — KAPPA/LAMBDA LIGHT CHAINS
Kappa free light chain: 31.7 mg/L — ABNORMAL HIGH (ref 3.3–19.4)
Kappa, lambda light chain ratio: 4.17 — ABNORMAL HIGH (ref 0.26–1.65)
Lambda free light chains: 7.6 mg/L (ref 5.7–26.3)

## 2023-01-23 LAB — MULTIPLE MYELOMA PANEL, SERUM
Albumin SerPl Elph-Mcnc: 3.3 g/dL (ref 2.9–4.4)
Albumin/Glob SerPl: 1.4 (ref 0.7–1.7)
Alpha 1: 0.2 g/dL (ref 0.0–0.4)
Alpha2 Glob SerPl Elph-Mcnc: 0.7 g/dL (ref 0.4–1.0)
B-Globulin SerPl Elph-Mcnc: 1.1 g/dL (ref 0.7–1.3)
Gamma Glob SerPl Elph-Mcnc: 0.4 g/dL (ref 0.4–1.8)
Globulin, Total: 2.4 g/dL (ref 2.2–3.9)
IgA: 63 mg/dL — ABNORMAL LOW (ref 90–386)
IgG (Immunoglobin G), Serum: 518 mg/dL — ABNORMAL LOW (ref 603–1613)
IgM (Immunoglobulin M), Srm: 31 mg/dL (ref 20–172)
Total Protein ELP: 5.7 g/dL — ABNORMAL LOW (ref 6.0–8.5)

## 2023-01-27 MED FILL — Dexamethasone Sodium Phosphate Inj 100 MG/10ML: INTRAMUSCULAR | Qty: 1 | Status: AC

## 2023-01-28 ENCOUNTER — Inpatient Hospital Stay: Payer: BC Managed Care – PPO

## 2023-01-28 ENCOUNTER — Encounter: Payer: Self-pay | Admitting: Oncology

## 2023-01-28 ENCOUNTER — Inpatient Hospital Stay (HOSPITAL_BASED_OUTPATIENT_CLINIC_OR_DEPARTMENT_OTHER): Payer: BC Managed Care – PPO | Admitting: Oncology

## 2023-01-28 VITALS — BP 138/91 | HR 98 | Temp 98.2°F | Resp 18 | Ht 67.0 in | Wt 171.7 lb

## 2023-01-28 VITALS — BP 137/96 | HR 89 | Temp 98.4°F | Resp 18

## 2023-01-28 DIAGNOSIS — D696 Thrombocytopenia, unspecified: Secondary | ICD-10-CM | POA: Insufficient documentation

## 2023-01-28 DIAGNOSIS — Z5111 Encounter for antineoplastic chemotherapy: Secondary | ICD-10-CM | POA: Diagnosis not present

## 2023-01-28 DIAGNOSIS — D849 Immunodeficiency, unspecified: Secondary | ICD-10-CM | POA: Diagnosis not present

## 2023-01-28 DIAGNOSIS — C9002 Multiple myeloma in relapse: Secondary | ICD-10-CM | POA: Diagnosis not present

## 2023-01-28 DIAGNOSIS — N1831 Chronic kidney disease, stage 3a: Secondary | ICD-10-CM

## 2023-01-28 DIAGNOSIS — Z79899 Other long term (current) drug therapy: Secondary | ICD-10-CM | POA: Diagnosis not present

## 2023-01-28 DIAGNOSIS — Z9484 Stem cells transplant status: Secondary | ICD-10-CM

## 2023-01-28 DIAGNOSIS — F1120 Opioid dependence, uncomplicated: Secondary | ICD-10-CM

## 2023-01-28 DIAGNOSIS — Z5112 Encounter for antineoplastic immunotherapy: Secondary | ICD-10-CM | POA: Diagnosis not present

## 2023-01-28 LAB — CMP (CANCER CENTER ONLY)
ALT: 19 U/L (ref 0–44)
AST: 23 U/L (ref 15–41)
Albumin: 3.8 g/dL (ref 3.5–5.0)
Alkaline Phosphatase: 90 U/L (ref 38–126)
Anion gap: 8 (ref 5–15)
BUN: 21 mg/dL — ABNORMAL HIGH (ref 6–20)
CO2: 22 mmol/L (ref 22–32)
Calcium: 8.6 mg/dL — ABNORMAL LOW (ref 8.9–10.3)
Chloride: 104 mmol/L (ref 98–111)
Creatinine: 1.55 mg/dL — ABNORMAL HIGH (ref 0.61–1.24)
GFR, Estimated: 54 mL/min — ABNORMAL LOW (ref >60)
Glucose, Bld: 138 mg/dL — ABNORMAL HIGH (ref 70–99)
Potassium: 3.3 mmol/L — ABNORMAL LOW (ref 3.5–5.1)
Sodium: 134 mmol/L — ABNORMAL LOW (ref 135–145)
Total Bilirubin: 0.6 mg/dL (ref 0.3–1.2)
Total Protein: 6.3 g/dL — ABNORMAL LOW (ref 6.5–8.1)

## 2023-01-28 LAB — CBC WITH DIFFERENTIAL (CANCER CENTER ONLY)
Abs Immature Granulocytes: 0.06 10*3/uL (ref 0.00–0.07)
Basophils Absolute: 0.1 10*3/uL (ref 0.0–0.1)
Basophils Relative: 1 %
Eosinophils Absolute: 0.3 10*3/uL (ref 0.0–0.5)
Eosinophils Relative: 4 %
HCT: 43.2 % (ref 39.0–52.0)
Hemoglobin: 15.2 g/dL (ref 13.0–17.0)
Immature Granulocytes: 1 %
Lymphocytes Relative: 15 %
Lymphs Abs: 1.3 10*3/uL (ref 0.7–4.0)
MCH: 32.5 pg (ref 26.0–34.0)
MCHC: 35.2 g/dL (ref 30.0–36.0)
MCV: 92.3 fL (ref 80.0–100.0)
Monocytes Absolute: 0.5 10*3/uL (ref 0.1–1.0)
Monocytes Relative: 6 %
Neutro Abs: 6.1 10*3/uL (ref 1.7–7.7)
Neutrophils Relative %: 73 %
Platelet Count: 226 10*3/uL (ref 150–400)
RBC: 4.68 MIL/uL (ref 4.22–5.81)
RDW: 13.3 % (ref 11.5–15.5)
WBC Count: 8.3 10*3/uL (ref 4.0–10.5)
nRBC: 0 % (ref 0.0–0.2)

## 2023-01-28 MED ORDER — HEPARIN SOD (PORK) LOCK FLUSH 100 UNIT/ML IV SOLN
500.0000 [IU] | Freq: Once | INTRAVENOUS | Status: AC | PRN
  Administered 2023-01-28: 500 [IU]
  Filled 2023-01-28: qty 5

## 2023-01-28 MED ORDER — SODIUM CHLORIDE 0.9 % IV SOLN
Freq: Once | INTRAVENOUS | Status: AC
  Filled 2023-01-28: qty 250

## 2023-01-28 MED ORDER — SODIUM CHLORIDE 0.9 % IV SOLN
10.0000 mg | Freq: Once | INTRAVENOUS | Status: AC
  Administered 2023-01-28: 10 mg via INTRAVENOUS
  Filled 2023-01-28: qty 10

## 2023-01-28 MED ORDER — DEXTROSE 5 % IV SOLN
20.0000 mg/m2 | Freq: Once | INTRAVENOUS | Status: AC
  Administered 2023-01-28: 36 mg via INTRAVENOUS
  Filled 2023-01-28: qty 15

## 2023-01-28 NOTE — Progress Notes (Signed)
Hematology/Oncology Consult note [facility]  [phone] [phone]  Patient Care Team: Doreene Nest, NP as PCP - General (Internal Medicine) Martin Majestic, MD as PCP - Hematology/Oncology Creig Hines, MD as Consulting Physician (Hematology and Oncology)   Name of the patient: James House  [id number]  1971-02-08   Date of visit: 01/28/23  Diagnosis- kappa light chain multiple myeloma in relapse   Chief complaint/ Reason for visit-on treatment assessment prior to cycle 7-day 8 of carfilzomib  Heme/Onc history: Patient is a 52- year-old male with a history of kappa light chain myeloma that was treated by [name].  History is as follows:   1.  Patient presented with renal insufficiency with a creatinine of 2.4 in April 2018.  Kidney biopsy showed myeloma cast nephropathy.  Kappa light chain was 3004 and lambda 0.22 with a kappa lambda ratio of 13,000 655.  Skeletal survey showed multiple calvarial and marrow lesions.  Bone marrow biopsy in May 2018 showed 43% plasma cells.  He received CyBorD for cycle 1 in May 2018 and was subsequently switched to RVD.  He received 4 cycles followed by a repeat bone marrow biopsy in August 2018 which showed no increase in plasma cells.   2.  He underwent autologous stem cell transplantation on 01/30/2017.  He was started on Revlimid maintenance 10 mg daily in January 2019.   3.  Labs from January February and March 2019 done at [facility] showed no M spike, normal kappa lambda ratio of 1.06.  He was last seen by them in April 2019.   4. Following that patient was admitted to [facility] for healthcare associated pneumonia and was recently discharged.  He wished to transfer his care to [place] at this time.  He was on maintenance Revlimid 5 mg which was then reduced to 2.5 mg.  Follows up with pain clinic for chemo-induced peripheral neuropathy with [name].  His urine tested positive for [facility] and therefore  patient was discharged from pain clinic   5.  Noted to have gradually increasing free kappa light chainFrom 15 in 20 21-39.5 with a ratio of 3.09.  He was seen by [facility] again and underwent a repeat bone marrow biopsy which did not show any evidence of clonal plasma cells.  PET CT scan also did not show any new active lytic lesions in May 2023.Marland Kitchen  Revlimid dose was increased to 15 mg 3 weeks on 1 week off but kappa light chains show continuing to increase   6.  PET CT scan in December 2023 did not show any evidence of active myeloma.  Patient underwent repeat bone marrow biopsy at [facility] which showed 6% plasma cellsConsistent with persistent plasma cell neoplasm.  74 to 86% of isolated plasma cells show loss of T p53 and monosomy 13.  Patient was seen by [name] at [facility] and was recommended Darzalex Pomalyst dexamethasone regimen   7.  Disease progression after 8 weekly cycles of Darazalex kappa light chain increased from 332-646 with a ratio that went up from 39-57 concerning for disease progression.  Patient switched to carfilzomib Pomalyst dexamethasone regimen  Interval history-reports low back pain over the last 2 weeks which has not been controlled.  He has also had chronic left knee pain which comes and goes and had an MRI for the same in March 2024  ECOG PS- 1 Pain scale- 6 Opioid associated constipation- no  Review of systems- Review of Systems  Constitutional:  Positive for malaise/fatigue. Negative for chills, fever and weight loss.  HENT:  Negative for congestion, ear discharge and nosebleeds.   Eyes:  Negative for blurred vision.  Respiratory:  Negative for cough, hemoptysis, sputum production, shortness of breath and wheezing.   Cardiovascular:  Negative for chest pain, palpitations, orthopnea and claudication.  Gastrointestinal:  Negative for abdominal pain, blood in stool, constipation, diarrhea, heartburn, melena, nausea and vomiting.  Genitourinary:  Negative for dysuria, flank pain,  frequency, hematuria and urgency.  Musculoskeletal:  Positive for back pain. Negative for joint pain and myalgias.  Skin:  Negative for rash.  Neurological:  Negative for dizziness, tingling, focal weakness, seizures, weakness and headaches.  Endo/Heme/Allergies:  Does not bruise/bleed easily.  Psychiatric/Behavioral:  Negative for depression and suicidal ideas. The patient does not have insomnia.       No Known Allergies   Past Medical History:  Diagnosis Date   CKD (chronic kidney disease) stage 3, GFR 30-59 ml/min (HCC)    Elevated sedimentation rate 12/24/2017   Hypertension    Multiple myeloma (HCC) 08/11/2017   Neuropathy    Pneumonia    Severe sepsis (HCC) 08/11/2017   Shingles 08/03/2019     Past Surgical History:  Procedure Laterality Date   ABDOMINAL SURGERY     COLONOSCOPY WITH PROPOFOL N/A 05/25/2021   Procedure: COLONOSCOPY WITH PROPOFOL;  Surgeon: Toney Reil, MD;  Location[facility] ENDOSCOPY;  Service: Gastroenterology;  Laterality: N/A;   IR IMAGING GUIDED PORT INSERTION  08/02/2022   JOINT REPLACEMENT     right knee   KNEE SURGERY Left     Social History   Socioeconomic History   Marital status: Single    Spouse name: [name]   Number of children: 3   Years of education: Not on file   Highest education level: Not on file  Occupational History   Not on file  Tobacco Use   Smoking status: Former   Smokeless tobacco: Current    Types: Snuff  Vaping Use   Vaping status: Never Used  Substance and Sexual Activity   Alcohol use: No   Drug use: No   Sexual activity: Yes  Other Topics Concern   Not on file  Social History Narrative   Live in private residence with spouse and mother in law   Social Determinants of Health   Financial Resource Strain: Low Risk  (08/12/2017)   Overall Financial Resource Strain (CARDIA)    Difficulty of Paying Living Expenses: Not hard at all  Food Insecurity: No Food Insecurity (08/12/2017)   Hunger Vital Sign     Worried About Running Out of Food in the Last Year: Never true    Ran Out of Food in the Last Year: Never true  Transportation Needs: No Transportation Needs (08/12/2017)   PRAPARE - Administrator, Civil Service (Medical): No    Lack of Transportation (Non-Medical): No  Physical Activity: Sufficiently Active (08/12/2017)   Exercise Vital Sign    Days of Exercise per Week: 7 days    Minutes of Exercise per Session: 30 min  Stress: No Stress Concern Present (08/12/2017)   [employer] of Occupational Health - Occupational Stress Questionnaire    Feeling of Stress : Only a little  Social Connections: Somewhat Isolated (08/12/2017)   Social Connection and Isolation Panel [NHANES]    Frequency of Communication with Friends and Family: Once a week    Frequency of Social Gatherings with Friends and Family: Once a week  Attends Religious Services: More than 4 times per year    Active Member of Clubs or Organizations: No    Attends Banker Meetings: Never    Marital Status: Married  Catering manager Violence: Not At Risk (08/12/2017)   Humiliation, Afraid, Rape, and Kick questionnaire    Fear of Current or Ex-Partner: No    Emotionally Abused: No    Physically Abused: No    Sexually Abused: No    Family History  Problem Relation Age of Onset   Cancer Mother 72       Pancreatic   COPD Mother    Diabetes Mother    Hyperlipidemia Mother    Hypertension Mother    Cancer Maternal Uncle        pancreatic   Cancer Maternal Grandmother 42       colon   COPD Maternal Grandmother    Diabetes Maternal Grandmother    Hypertension Maternal Grandmother      Current Outpatient Medications:    acyclovir (ZOVIRAX) 400 MG tablet, Take 1 tablet (400 mg total) by mouth 2 (two) times daily., Disp: 60 tablet, Rfl: 2   albuterol (VENTOLIN HFA) 108 (90 Base) MCG/ACT inhaler, Inhale 2 puffs into the lungs every 6 (six) hours as needed for wheezing or shortness of breath.,  Disp: 6.7 g, Rfl: 2   amitriptyline (ELAVIL) 25 MG tablet, Take 1 tablet (25 mg total) by mouth at bedtime. For headache prevention and sleep, Disp: 90 tablet, Rfl: 0   amLODipine-benazepril (LOTREL) 10-40 MG capsule, Take 1 capsule by mouth daily. for blood pressure., Disp: 90 capsule, Rfl: 0   amoxicillin-clavulanate (AUGMENTIN) 875-125 MG tablet, Take 1 tablet by mouth 2 (two) times daily. (Patient not taking: Reported on 11/28/2022), Disp: 14 tablet, Rfl: 0   aspirin EC 81 MG tablet, Take by mouth., Disp: , Rfl:    baclofen (LIORESAL) 10 MG tablet, Take 1 tablet (10 mg total) by mouth 4 (four) times daily as needed for muscle spasms., Disp: 360 tablet, Rfl: 0   celecoxib (CELEBREX) 200 MG capsule, Take 1 capsule (200 mg total) by mouth daily., Disp: 30 capsule, Rfl: 2   cyanocobalamin (VITAMIN B12) 1000 MCG/ML injection, Inject 1 mL (1,000 mcg total) into the muscle every 30 (thirty) days., Disp: 1 mL, Rfl: 11   DULoxetine (CYMBALTA) 60 MG capsule, Take 1 capsule (60 mg total) by mouth daily., Disp: 60 capsule, Rfl: 1   levothyroxine (SYNTHROID) 50 MCG tablet, Take 1 tablet (50 mcg total) by mouth daily before breakfast., Disp: 30 tablet, Rfl: 2   lidocaine-prilocaine (EMLA) cream, Apply 1 Application topically as needed., Disp: 30 g, Rfl: 0   montelukast (SINGULAIR) 10 MG tablet, Take 1 tablet in the evening 1 day before your treatment. Day of treatment take 1 tablet in the morning for the first 2 treatments of Daratumumab, Disp: 4 tablet, Rfl: 0   OLANZapine (ZYPREXA) 10 MG tablet, Take 1 tablet (10 mg total) by mouth at bedtime. To help with nausea prevention., Disp: 30 tablet, Rfl: 1   OLANZapine (ZYPREXA) 10 MG tablet, Take by mouth. (Patient not taking: Reported on 12/25/2022), Disp: , Rfl:    oxyCODONE ER (XTAMPZA ER) 9 MG C12A, Take 9 mg by mouth every 12 (twelve) hours., Disp: 60 capsule, Rfl: 0   Oxycodone HCl 10 MG TABS, Take 1 tablet (10 mg total) by mouth every 8 (eight) hours as needed.,  Disp: 90 tablet, Rfl: 0   pomalidomide (POMALYST) 2 MG capsule, Take 1  capsule (2 mg total) by mouth daily. AND 7 DAYS OFF.  CELGENE-REM : [id number]   DONE ON 01/17/2023, Disp: 21 capsule, Rfl: 0   potassium chloride SA (KLOR-CON M) 20 MEQ tablet, Take 2 tablets (40 mEq total) by mouth 2 (two) times daily., Disp: 120 tablet, Rfl: 0   pregabalin (LYRICA) 300 MG capsule, Take 1 capsule (300 mg total) by mouth 2 (two) times daily., Disp: 60 capsule, Rfl: 3   prochlorperazine (COMPAZINE) 10 MG tablet, Take by mouth., Disp: , Rfl:   Physical exam:  Vitals:   01/28/23 0929  BP: (!) 138/91  Pulse: 98  Resp: 18  Temp: 98.2 F (36.8 C)  TempSrc: Tympanic  SpO2: 100%  Weight: 171 lb 11.2 oz (77.9 kg)  Height: 5\' 7"  (1.702 m)   Physical Exam Cardiovascular:     Rate and Rhythm: Normal rate and regular rhythm.     Heart sounds: Normal heart sounds.  Pulmonary:     Effort: Pulmonary effort is normal.     Breath sounds: Normal breath sounds.  Skin:    General: Skin is warm and dry.  Neurological:     Mental Status: He is alert and oriented to person, place, and time.         Latest Ref Rng & Units 01/21/2023    8:27 AM  CMP  Glucose 70 - 99 mg/dL 366   BUN 6 - 20 mg/dL 16   Creatinine 4.40 - 1.24 mg/dL 3.47   Sodium 425 - 956 mmol/L 139   Potassium 3.5 - 5.1 mmol/L 3.5   Chloride 98 - 111 mmol/L 104   CO2 22 - 32 mmol/L 25   Calcium 8.9 - 10.3 mg/dL 9.1   Total Protein 6.5 - 8.1 g/dL 6.3   Total Bilirubin 0.3 - 1.2 mg/dL 0.7   Alkaline Phos 38 - 126 U/L 78   AST 15 - 41 U/L 25   ALT 0 - 44 U/L 23       Latest Ref Rng & Units 01/28/2023    8:54 AM  CBC  WBC 4.0 - 10.5 K/uL 8.3   Hemoglobin 13.0 - 17.0 g/dL 38.7   Hematocrit 56.4 - 52.0 % 43.2   Platelets 150 - 400 K/uL 226     No images are attached to the encounter.  CT Chest Wo Contrast  Result Date: 01/07/2023 CLINICAL DATA:  Multiple myeloma. Chronic shortness of breath. Exertional dyspnea. * Tracking Code: BO *  EXAM: CT CHEST WITHOUT CONTRAST TECHNIQUE: Multidetector CT imaging of the chest was performed following the standard protocol without IV contrast. RADIATION DOSE REDUCTION: This exam was performed according to the departmental dose-optimization program which includes automated exposure control, adjustment of the mA and/or kV according to patient size and/or use of iterative reconstruction technique. COMPARISON:  PET-CT from 05/10/2022 FINDINGS: Cardiovascular: Normal heart size.  No pericardial effusion. Mediastinum/Nodes: Thyroid gland, trachea, and esophagus appear normal. Lungs/Pleura: There is no pleural effusion, airspace consolidation, atelectasis or pneumothorax. No signs of interstitial lung disease. Mild, patchy area of ground-glass attenuation and scarring is noted within the periphery of the right lower lobe, image 121/4 and image 125/4. Subpleural nodule within the posterolateral left lower lobe measures 3 mm. Unchanged from previous exam compatible with a benign nodule. No new or suspicious lung nodules identified. Upper Abdomen: No acute abnormality. Musculoskeletal: There is a small lucent lesion involving the internal cortex of the posterior right ninth rib, image 98/2. Not confidently identified on the PET-CT from 05/10/2022.  IMPRESSION: 1. No acute cardiopulmonary abnormalities. 2. Mild, patchy area of ground-glass attenuation and scarring is noted within the periphery of the right lower lobe. This is most likely postinflammatory/infectious in etiology. 3. Small lucent lesion involving the internal cortex of the posterior right ninth rib. Not confidently identified on the PET-CT from 05/10/2022. This is nonspecific and may represent a small myelomatous lesion. 4. Subpleural nodule within the posterolateral left lower lobe measures 3 mm. Unchanged from previous exam likely reflecting a benign abnormality. Electronically Signed   By: Signa Kell M.D.   On: 01/07/2023 17:28     Assessment and  plan- Patient is a 52 y.o. male with history of kappa light chain multiple myeloma currently in partial remission.  He is here for on treatment assessment prior to cycle 7-day 8 of carfilzomib  Counts okay to proceed with cycle 7-day 8 of carfilzomib today.  Serum creatinine fluctuates between 1.3-1.5 but overall stable.  He is tolerating Pomalyst 2mg  2 weeks on and 1 week off well without any significant side effects.  Myeloma panel from a week ago showed no evidence of M protein on SPEP and serum free light chain ratio was stable around 4.  Low back pain: If symptoms continue I will consider getting MRI lumbar spine or PET scan at that time.  He will let us know how his symptoms are doing  Hypertension: He is currently on amlodipine benazepril and blood pressure in ourOffice today is 138/91.  He has forgotten to bring his blood pressure log.  If it remains more than 130/80 he will need to be started on additional antihypertensives.  He is also meeting with cardiology later this month  Chemo-induced peripheral neuropathy: Continue as needed oxycodone and gabapentin   Visit Diagnosis 1. High risk medication use   2. Multiple myeloma in relapse (HCC)   3. Encounter for antineoplastic chemotherapy   4. Immunosuppressed status (HCC)   5. Thrombocytopenia, unspecified (HCC)   6. Stem cells transplant status (HCC) Chronic  7. Uncomplicated opioid dependence (HCC) Chronic  8. Stage 3a chronic kidney disease (HCC) Chronic     Dr. Owens Shark, MD, MPH [facility] [phone] 01/28/2023 9:19 AM

## 2023-01-28 NOTE — Patient Instructions (Signed)
[facility]  Discharge Instructions: Thank you for choosing [facility] to provide your oncology and hematology care.  If you have a lab appointment with the Cancer Center, please go directly to the Cancer Center and check in at the registration area.  Wear comfortable clothing and clothing appropriate for easy access to any Portacath or PICC line.   We strive to give you quality time with your provider. You may need to reschedule your appointment if you arrive late (15 or more minutes).  Arriving late affects you and other patients whose appointments are after yours.  Also, if you miss three or more appointments without notifying the office, you may be dismissed from the clinic at the provider's discretion.      For prescription refill requests, have your pharmacy contact our office and allow 72 hours for refills to be completed.    Today you received the following chemotherapy and/or immunotherapy agents- kyprolis      To help prevent nausea and vomiting after your treatment, we encourage you to take your nausea medication as directed.  BELOW ARE SYMPTOMS THAT SHOULD BE REPORTED IMMEDIATELY: *FEVER GREATER THAN 100.4 F (38 C) OR HIGHER *CHILLS OR SWEATING *NAUSEA AND VOMITING THAT IS NOT CONTROLLED WITH YOUR NAUSEA MEDICATION *UNUSUAL SHORTNESS OF BREATH *UNUSUAL BRUISING OR BLEEDING *URINARY PROBLEMS (pain or burning when urinating, or frequent urination) *BOWEL PROBLEMS (unusual diarrhea, constipation, pain near the anus) TENDERNESS IN MOUTH AND THROAT WITH OR WITHOUT PRESENCE OF ULCERS (sore throat, sores in mouth, or a toothache) UNUSUAL RASH, SWELLING OR PAIN  UNUSUAL VAGINAL DISCHARGE OR ITCHING   Items with * indicate a potential emergency and should be followed up as soon as possible or go to the Emergency Department if any problems should occur.  Please show the CHEMOTHERAPY ALERT CARD or IMMUNOTHERAPY ALERT CARD at check-in to  the Emergency Department and triage nurse.  Should you have questions after your visit or need to cancel or reschedule your appointment, please contact [facility] [facility]  [phone] and follow the prompts.  Office hours are 8:00 a.m. to 4:30 p.m. Monday - Friday. Please note that voicemails left after 4:00 p.m. may not be returned until the following business day.  We are closed weekends and major holidays. You have access to a nurse at all times for urgent questions. Please call the main number to the clinic [phone] and follow the prompts.  For any non-urgent questions, you may also contact your provider using MyChart. We now offer e-Visits for anyone 18 and older to request care online for non-urgent symptoms. For details visit [email or web address].   Also download the MyChart app! Go to the app store, search "MyChart", open the app, select [facility], and log in with your MyChart username and password.    

## 2023-02-03 MED FILL — Dexamethasone Sodium Phosphate Inj 100 MG/10ML: INTRAMUSCULAR | Qty: 1 | Status: AC

## 2023-02-04 ENCOUNTER — Encounter: Payer: Self-pay | Admitting: Oncology

## 2023-02-04 ENCOUNTER — Inpatient Hospital Stay: Payer: BC Managed Care – PPO

## 2023-02-04 VITALS — BP 143/94 | HR 79 | Temp 97.6°F | Resp 18

## 2023-02-04 DIAGNOSIS — C9002 Multiple myeloma in relapse: Secondary | ICD-10-CM | POA: Diagnosis not present

## 2023-02-04 DIAGNOSIS — Z5112 Encounter for antineoplastic immunotherapy: Secondary | ICD-10-CM | POA: Diagnosis not present

## 2023-02-04 DIAGNOSIS — Z79899 Other long term (current) drug therapy: Secondary | ICD-10-CM | POA: Diagnosis not present

## 2023-02-04 LAB — CMP (CANCER CENTER ONLY)
ALT: 23 U/L (ref 0–44)
AST: 27 U/L (ref 15–41)
Albumin: 3.4 g/dL — ABNORMAL LOW (ref 3.5–5.0)
Alkaline Phosphatase: 84 U/L (ref 38–126)
Anion gap: 7 (ref 5–15)
BUN: 17 mg/dL (ref 6–20)
CO2: 24 mmol/L (ref 22–32)
Calcium: 8.6 mg/dL — ABNORMAL LOW (ref 8.9–10.3)
Chloride: 106 mmol/L (ref 98–111)
Creatinine: 1.5 mg/dL — ABNORMAL HIGH (ref 0.61–1.24)
GFR, Estimated: 56 mL/min — ABNORMAL LOW (ref >60)
Glucose, Bld: 121 mg/dL — ABNORMAL HIGH (ref 70–99)
Potassium: 3.3 mmol/L — ABNORMAL LOW (ref 3.5–5.1)
Sodium: 137 mmol/L (ref 135–145)
Total Bilirubin: 0.8 mg/dL (ref 0.3–1.2)
Total Protein: 5.9 g/dL — ABNORMAL LOW (ref 6.5–8.1)

## 2023-02-04 LAB — CBC WITH DIFFERENTIAL (CANCER CENTER ONLY)
Abs Immature Granulocytes: 0.03 10*3/uL (ref 0.00–0.07)
Basophils Absolute: 0 10*3/uL (ref 0.0–0.1)
Basophils Relative: 1 %
Eosinophils Absolute: 0.4 10*3/uL (ref 0.0–0.5)
Eosinophils Relative: 12 %
HCT: 39.2 % (ref 39.0–52.0)
Hemoglobin: 13.8 g/dL (ref 13.0–17.0)
Immature Granulocytes: 1 %
Lymphocytes Relative: 17 %
Lymphs Abs: 0.6 10*3/uL — ABNORMAL LOW (ref 0.7–4.0)
MCH: 32.7 pg (ref 26.0–34.0)
MCHC: 35.2 g/dL (ref 30.0–36.0)
MCV: 92.9 fL (ref 80.0–100.0)
Monocytes Absolute: 0.4 10*3/uL (ref 0.1–1.0)
Monocytes Relative: 13 %
Neutro Abs: 2 10*3/uL (ref 1.7–7.7)
Neutrophils Relative %: 56 %
Platelet Count: 166 10*3/uL (ref 150–400)
RBC: 4.22 MIL/uL (ref 4.22–5.81)
RDW: 13.3 % (ref 11.5–15.5)
WBC Count: 3.5 10*3/uL — ABNORMAL LOW (ref 4.0–10.5)
nRBC: 0 % (ref 0.0–0.2)

## 2023-02-04 MED ORDER — HEPARIN SOD (PORK) LOCK FLUSH 100 UNIT/ML IV SOLN
500.0000 [IU] | Freq: Once | INTRAVENOUS | Status: AC | PRN
  Administered 2023-02-04: 500 [IU]
  Filled 2023-02-04: qty 5

## 2023-02-04 MED ORDER — DEXTROSE 5 % IV SOLN
20.0000 mg/m2 | Freq: Once | INTRAVENOUS | Status: AC
  Administered 2023-02-04: 36 mg via INTRAVENOUS
  Filled 2023-02-04: qty 18

## 2023-02-04 MED ORDER — SODIUM CHLORIDE 0.9 % IV SOLN
10.0000 mg | Freq: Once | INTRAVENOUS | Status: AC
  Administered 2023-02-04: 10 mg via INTRAVENOUS
  Filled 2023-02-04: qty 10

## 2023-02-04 MED ORDER — SODIUM CHLORIDE 0.9 % IV SOLN
Freq: Once | INTRAVENOUS | Status: AC
  Filled 2023-02-04: qty 250

## 2023-02-04 MED ORDER — SODIUM CHLORIDE 0.9% FLUSH
10.0000 mL | INTRAVENOUS | Status: DC | PRN
  Administered 2023-02-04: 10 mL
  Filled 2023-02-04: qty 10

## 2023-02-06 ENCOUNTER — Ambulatory Visit: Payer: BC Managed Care – PPO | Attending: Cardiology | Admitting: Cardiology

## 2023-02-06 ENCOUNTER — Other Ambulatory Visit: Payer: Self-pay | Admitting: Oncology

## 2023-02-06 ENCOUNTER — Encounter: Payer: Self-pay | Admitting: Cardiology

## 2023-02-06 ENCOUNTER — Other Ambulatory Visit: Payer: Self-pay

## 2023-02-06 VITALS — BP 164/92 | HR 95 | Ht 67.0 in | Wt 173.8 lb

## 2023-02-06 DIAGNOSIS — R5382 Chronic fatigue, unspecified: Secondary | ICD-10-CM | POA: Diagnosis not present

## 2023-02-06 DIAGNOSIS — I10 Essential (primary) hypertension: Secondary | ICD-10-CM

## 2023-02-06 MED ORDER — CARVEDILOL 6.25 MG PO TABS
6.2500 mg | ORAL_TABLET | Freq: Two times a day (BID) | ORAL | 3 refills | Status: DC
  Filled 2023-02-06 (×2): qty 180, 90d supply, fill #0

## 2023-02-06 MED FILL — Oxycodone HCl Tab 10 MG: ORAL | 30 days supply | Qty: 90 | Fill #0 | Status: AC

## 2023-02-06 NOTE — Patient Instructions (Signed)
Medication Instructions:  Please start Carvedilol ( Coreg) 6.25 MG twice daily.    *If you need a refill on your cardiac medications before your next appointment, please call your pharmacy*   Lab Work: None ordered.   If you have labs (blood work) drawn today and your tests are completely normal, you will receive your results only by: MyChart Message (if you have MyChart) OR A paper copy in the mail If you have any lab test that is abnormal or we need to change your treatment, we will call you to review the results.   Testing/Procedures: None ordered.   Follow-Up: At [facility], you and your health needs are our priority.  As part of our continuing mission to provide you with exceptional heart care, we have created designated Provider Care Teams.  These Care Teams include your primary Cardiologist (physician) and Advanced Practice Providers (APPs -  Physician Assistants and Nurse Practitioners) who all work together to provide you with the care you need, when you need it.  We recommend signing up for the patient portal called "MyChart".  Sign up information is provided on this After Visit Summary.  MyChart is used to connect with patients for Virtual Visits (Telemedicine).  Patients are able to view lab/test results, encounter notes, upcoming appointments, etc.  Non-urgent messages can be sent to your provider as well.   To learn more about what you can do with MyChart, go to [email or web address].    Your next appointment:   4-6 week(s)  Provider:   You may see Dr. Azucena Cecil or one of the following Advanced Practice Providers on your designated Care Team:   Nicolasa Ducking, NP Eula Listen, PA-C Cadence Fransico Michael, PA-C Charlsie Quest, NP

## 2023-02-06 NOTE — Progress Notes (Signed)
Cardiology Office Note:    Date:  02/06/2023   ID:  James House., DOB 04/26/1970, MRN [id number]  PCP:  Doreene Nest, NP    HeartCare Providers Cardiologist:  None     Referring MD: Doreene Nest, NP   Chief Complaint  Patient presents with   New Patient (Initial Visit)    Referred for cardiac evaluation of shortness of breath and increasing blood pressure with no cardiac history.    History of Present Illness:    James House. is a 52 y.o. male with a hx of hypertension, multiple myeloma s/p chemo, stem cell transplant 2018, chemo induced peripheral neuropathy, back and knee pain, CKD 3 presenting with shortness of breath.  BPs have been elevated of late, Norvasc and benazepril recently increased by primary care provider.  Currently on chemo for myeloma relapse management per oncology.  States BP has been elevated since starting new chemo medication with noted side effect being hypertension and increased heart rate.  Endorse having high heart rates and occasional palpitation.  Typical heart rates in the high 90s or low 100s.  Echocardiogram 09/2022 EF 55-60, impaired relaxation, mild MR  Past Medical History:  Diagnosis Date   CKD (chronic kidney disease) stage 3, GFR 30-59 ml/min (HCC)    Elevated sedimentation rate 12/24/2017   Hypertension    Multiple myeloma (HCC) 08/11/2017   Neuropathy    Pneumonia    Severe sepsis (HCC) 08/11/2017   Shingles 08/03/2019    Past Surgical History:  Procedure Laterality Date   ABDOMINAL SURGERY     COLONOSCOPY WITH PROPOFOL N/A 05/25/2021   Procedure: COLONOSCOPY WITH PROPOFOL;  Surgeon: Toney Reil, MD;  Location: [facility] ENDOSCOPY;  Service: Gastroenterology;  Laterality: N/A;   IR IMAGING GUIDED PORT INSERTION  08/02/2022   JOINT REPLACEMENT     right knee   KNEE SURGERY Left     Current Medications: Current Meds  Medication Sig   acyclovir (ZOVIRAX) 400 MG tablet Take 1 tablet (400 mg total) by  mouth 2 (two) times daily.   albuterol (VENTOLIN HFA) 108 (90 Base) MCG/ACT inhaler Inhale 2 puffs into the lungs every 6 (six) hours as needed for wheezing or shortness of breath.   amitriptyline (ELAVIL) 25 MG tablet Take 1 tablet (25 mg total) by mouth at bedtime. For headache prevention and sleep   amLODipine-benazepril (LOTREL) 10-40 MG capsule Take 1 capsule by mouth daily. for blood pressure.   aspirin EC 81 MG tablet Take by mouth.   baclofen (LIORESAL) 10 MG tablet Take 1 tablet (10 mg total) by mouth 4 (four) times daily as needed for muscle spasms.   carvedilol (COREG) 6.25 MG tablet Take 1 tablet (6.25 mg total) by mouth 2 (two) times daily.   celecoxib (CELEBREX) 200 MG capsule Take 1 capsule (200 mg total) by mouth daily.   cyanocobalamin (VITAMIN B12) 1000 MCG/ML injection Inject 1 mL (1,000 mcg total) into the muscle every 30 (thirty) days.   levothyroxine (SYNTHROID) 50 MCG tablet Take 1 tablet (50 mcg total) by mouth daily before breakfast.   lidocaine-prilocaine (EMLA) cream Apply 1 Application topically as needed.   montelukast (SINGULAIR) 10 MG tablet Take 1 tablet in the evening 1 day before your treatment. Day of treatment take 1 tablet in the morning for the first 2 treatments of Daratumumab   OLANZapine (ZYPREXA) 10 MG tablet Take 1 tablet (10 mg total) by mouth at bedtime. To help with nausea prevention.  oxyCODONE ER (XTAMPZA ER) 9 MG C12A Take 9 mg by mouth every 12 (twelve) hours.   pomalidomide (POMALYST) 2 MG capsule Take 1 capsule (2 mg total) by mouth daily. AND 7 DAYS OFF.  CELGENE-REM : [id number]   DONE ON 01/17/2023   potassium chloride SA (KLOR-CON M) 20 MEQ tablet Take 2 tablets (40 mEq total) by mouth 2 (two) times daily.   pregabalin (LYRICA) 300 MG capsule Take 1 capsule (300 mg total) by mouth 2 (two) times daily.   prochlorperazine (COMPAZINE) 10 MG tablet Take by mouth.   [DISCONTINUED] Oxycodone HCl 10 MG TABS Take 1 tablet (10 mg total) by mouth every 8  (eight) hours as needed.     Allergies:   Patient has no known allergies.   Social History   Socioeconomic History   Marital status: Single    Spouse name: [name]   Number of children: 3   Years of education: Not on file   Highest education level: Not on file  Occupational History   Not on file  Tobacco Use   Smoking status: Former   Smokeless tobacco: Current    Types: Snuff  Vaping Use   Vaping status: Never Used  Substance and Sexual Activity   Alcohol use: No   Drug use: No   Sexual activity: Yes  Other Topics Concern   Not on file  Social History Narrative   Live in private residence with spouse and mother in law   Social Determinants of Health   Financial Resource Strain: Low Risk  (08/12/2017)   Overall Financial Resource Strain (CARDIA)    Difficulty of Paying Living Expenses: Not hard at all  Food Insecurity: No Food Insecurity (08/12/2017)   Hunger Vital Sign    Worried About Running Out of Food in the Last Year: Never true    Ran Out of Food in the Last Year: Never true  Transportation Needs: No Transportation Needs (08/12/2017)   PRAPARE - Administrator, Civil Service (Medical): No    Lack of Transportation (Non-Medical): No  Physical Activity: Sufficiently Active (08/12/2017)   Exercise Vital Sign    Days of Exercise per Week: 7 days    Minutes of Exercise per Session: 30 min  Stress: No Stress Concern Present (08/12/2017)   [employer] of Occupational Health - Occupational Stress Questionnaire    Feeling of Stress : Only a little  Social Connections: Somewhat Isolated (08/12/2017)   Social Connection and Isolation Panel [NHANES]    Frequency of Communication with Friends and Family: Once a week    Frequency of Social Gatherings with Friends and Family: Once a week    Attends Religious Services: More than 4 times per year    Active Member of [employer] or Organizations: No    Attends Engineer, structural: Never    Marital Status:  Married     Family History: The patient's family history includes COPD in his maternal grandmother and mother; Cancer in his maternal uncle; Cancer (age of onset: 41) in his maternal grandmother; Cancer (age of onset: 68) in his mother; Diabetes in his maternal grandmother and mother; Heart disease in his maternal grandmother and mother; Hyperlipidemia in his mother; Hypertension in his maternal grandmother and mother.  ROS:   Please see the history of present illness.     All other systems reviewed and are negative.  EKGs/Labs/Other Studies Reviewed:    The following studies were reviewed today:  EKG Interpretation Date/Time:  Thursday February 06 2023 08:43:19 EDT Ventricular Rate:  95 PR Interval:  122 QRS Duration:  94 QT Interval:  350 QTC Calculation: 439 R Axis:   13  Text Interpretation: Normal sinus rhythm Normal ECG Confirmed by Debbe Odea ([id number]) on 02/06/2023 8:44:08 AM    Recent Labs: 12/03/2022: TSH 7.279 01/21/2023: B Natriuretic Peptide 27.9 02/04/2023: ALT 23; BUN 17; Creatinine 1.50; Hemoglobin 13.8; Platelet Count 166; Potassium 3.3; Sodium 137  Recent Lipid Panel    Component Value Date/Time   CHOL 172 06/08/2021 0856   TRIG 89.0 06/08/2021 0856   HDL 44.80 06/08/2021 0856   CHOLHDL 4 06/08/2021 0856   VLDL 17.8 06/08/2021 0856   LDLCALC 109 (H) 06/08/2021 0856     Risk Assessment/Calculations:     HYPERTENSION CONTROL Vitals:   02/06/23 0838 02/06/23 0844  BP: (!) 156/90 (!) 164/92    The patient's blood pressure is elevated above target today.  In order to address the patient's elevated BP: A new medication was prescribed today.            Physical Exam:    VS:  BP (!) 164/92 (BP Location: Right Arm, Patient Position: Sitting, Cuff Size: Normal)   Pulse 95   Ht 5\' 7"  (1.702 m)   Wt 173 lb 12.8 oz (78.8 kg)   SpO2 99%   BMI 27.22 kg/m     Wt Readings from Last 3 Encounters:  02/06/23 173 lb 12.8 oz (78.8 kg)  01/28/23 171  lb 11.2 oz (77.9 kg)  01/21/23 167 lb 6.4 oz (75.9 kg)     GEN:  Well nourished, well developed in no acute distress HEENT: Normal NECK: No JVD; No carotid bruits CARDIAC: RRR, no murmurs, rubs, gallops RESPIRATORY:  Clear to auscultation without rales, wheezing or rhonchi  ABDOMEN: Soft, non-tender, non-distended MUSCULOSKELETAL:  No edema; No deformity  SKIN: Warm and dry NEUROLOGIC:  Alert and oriented x 3 PSYCHIATRIC:  Normal affect   ASSESSMENT:    1. Primary hypertension   2. Chronic fatigue    PLAN:    In order of problems listed above:  Hypertension, new chemo agent contributing.  Also has elevated pulse rates.  Start Coreg 6.25 mg twice daily for both BP and heart rate control.  Titrate as needed for adequate BP control.  Continue Norvasc 10 mg daily, benazepril 40 mg daily. Chronic fatigue, likely a combination of malignancy, chemotherapeutic agents.  Echo with normal EF.  Management of myeloma as per oncology team.  Follow-up in 4 to 6 weeks.      Medication Adjustments/Labs and Tests Ordered: Current medicines are reviewed at length with the patient today.  Concerns regarding medicines are outlined above.  Orders Placed This Encounter  Procedures   EKG 12-Lead   Meds ordered this encounter  Medications   carvedilol (COREG) 6.25 MG tablet    Sig: Take 1 tablet (6.25 mg total) by mouth 2 (two) times daily.    Dispense:  180 tablet    Refill:  3    Patient Instructions  Medication Instructions:  Please start Carvedilol ( Coreg) 6.25 MG twice daily.    *If you need a refill on your cardiac medications before your next appointment, please call your pharmacy*   Lab Work: None ordered.   If you have labs (blood work) drawn today and your tests are completely normal, you will receive your results only by: MyChart Message (if you have MyChart) OR A paper copy in the mail If you have any lab  test that is abnormal or we need to change your treatment, we  will call you to review the results.   Testing/Procedures: None ordered.   Follow-Up: At [facility], you and your health needs are our priority.  As part of our continuing mission to provide you with exceptional heart care, we have created designated Provider Care Teams.  These Care Teams include your primary Cardiologist (physician) and Advanced Practice Providers (APPs -  Physician Assistants and Nurse Practitioners) who all work together to provide you with the care you need, when you need it.  We recommend signing up for the patient portal called "MyChart".  Sign up information is provided on this After Visit Summary.  MyChart is used to connect with patients for Virtual Visits (Telemedicine).  Patients are able to view lab/test results, encounter notes, upcoming appointments, etc.  Non-urgent messages can be sent to your provider as well.   To learn more about what you can do with MyChart, go to [email or web address].    Your next appointment:   4-6 week(s)  Provider:   You may see Dr. Azucena Cecil or one of the following Advanced Practice Providers on your designated Care Team:   Nicolasa Ducking, NP Eula Listen, PA-C Cadence Fransico Michael, PA-C Charlsie Quest, NP        Signed, Debbe Odea, MD  02/06/2023 10:19 AM    [facility]

## 2023-02-17 MED FILL — Dexamethasone Sodium Phosphate Inj 100 MG/10ML: INTRAMUSCULAR | Qty: 2 | Status: AC

## 2023-02-18 ENCOUNTER — Encounter: Payer: Self-pay | Admitting: Oncology

## 2023-02-18 ENCOUNTER — Inpatient Hospital Stay: Payer: BC Managed Care – PPO

## 2023-02-18 ENCOUNTER — Inpatient Hospital Stay (HOSPITAL_BASED_OUTPATIENT_CLINIC_OR_DEPARTMENT_OTHER): Payer: BC Managed Care – PPO | Admitting: Oncology

## 2023-02-18 VITALS — BP 131/94 | HR 88 | Temp 97.8°F | Resp 18 | Wt 174.0 lb

## 2023-02-18 DIAGNOSIS — C9002 Multiple myeloma in relapse: Secondary | ICD-10-CM | POA: Diagnosis not present

## 2023-02-18 DIAGNOSIS — Z5111 Encounter for antineoplastic chemotherapy: Secondary | ICD-10-CM

## 2023-02-18 DIAGNOSIS — Z5112 Encounter for antineoplastic immunotherapy: Secondary | ICD-10-CM | POA: Diagnosis not present

## 2023-02-18 DIAGNOSIS — Z79899 Other long term (current) drug therapy: Secondary | ICD-10-CM

## 2023-02-18 LAB — CBC WITH DIFFERENTIAL (CANCER CENTER ONLY)
Abs Immature Granulocytes: 0.02 10*3/uL (ref 0.00–0.07)
Basophils Absolute: 0 10*3/uL (ref 0.0–0.1)
Basophils Relative: 1 %
Eosinophils Absolute: 0.3 10*3/uL (ref 0.0–0.5)
Eosinophils Relative: 6 %
HCT: 39.1 % (ref 39.0–52.0)
Hemoglobin: 13.6 g/dL (ref 13.0–17.0)
Immature Granulocytes: 0 %
Lymphocytes Relative: 21 %
Lymphs Abs: 1 10*3/uL (ref 0.7–4.0)
MCH: 32.7 pg (ref 26.0–34.0)
MCHC: 34.8 g/dL (ref 30.0–36.0)
MCV: 94 fL (ref 80.0–100.0)
Monocytes Absolute: 0.2 10*3/uL (ref 0.1–1.0)
Monocytes Relative: 5 %
Neutro Abs: 3.2 10*3/uL (ref 1.7–7.7)
Neutrophils Relative %: 67 %
Platelet Count: 216 10*3/uL (ref 150–400)
RBC: 4.16 MIL/uL — ABNORMAL LOW (ref 4.22–5.81)
RDW: 13.5 % (ref 11.5–15.5)
WBC Count: 4.8 10*3/uL (ref 4.0–10.5)
nRBC: 0 % (ref 0.0–0.2)

## 2023-02-18 LAB — CMP (CANCER CENTER ONLY)
ALT: 18 U/L (ref 0–44)
AST: 25 U/L (ref 15–41)
Albumin: 3.4 g/dL — ABNORMAL LOW (ref 3.5–5.0)
Alkaline Phosphatase: 89 U/L (ref 38–126)
Anion gap: 9 (ref 5–15)
BUN: 17 mg/dL (ref 6–20)
CO2: 23 mmol/L (ref 22–32)
Calcium: 8.7 mg/dL — ABNORMAL LOW (ref 8.9–10.3)
Chloride: 105 mmol/L (ref 98–111)
Creatinine: 1.49 mg/dL — ABNORMAL HIGH (ref 0.61–1.24)
GFR, Estimated: 56 mL/min — ABNORMAL LOW (ref >60)
Glucose, Bld: 173 mg/dL — ABNORMAL HIGH (ref 70–99)
Potassium: 3.4 mmol/L — ABNORMAL LOW (ref 3.5–5.1)
Sodium: 137 mmol/L (ref 135–145)
Total Bilirubin: 0.5 mg/dL (ref 0.3–1.2)
Total Protein: 5.7 g/dL — ABNORMAL LOW (ref 6.5–8.1)

## 2023-02-18 MED ORDER — SODIUM CHLORIDE 0.9 % IV SOLN
20.0000 mg | Freq: Once | INTRAVENOUS | Status: AC
  Administered 2023-02-18: 20 mg via INTRAVENOUS
  Filled 2023-02-18: qty 20

## 2023-02-18 MED ORDER — HEPARIN SOD (PORK) LOCK FLUSH 100 UNIT/ML IV SOLN
500.0000 [IU] | Freq: Once | INTRAVENOUS | Status: AC | PRN
  Administered 2023-02-18: 500 [IU]
  Filled 2023-02-18: qty 5

## 2023-02-18 MED ORDER — SODIUM CHLORIDE 0.9 % IV SOLN
Freq: Once | INTRAVENOUS | Status: AC
  Filled 2023-02-18: qty 250

## 2023-02-18 MED ORDER — DEXTROSE 5 % IV SOLN
20.0000 mg/m2 | Freq: Once | INTRAVENOUS | Status: AC
  Administered 2023-02-18: 36 mg via INTRAVENOUS
  Filled 2023-02-18: qty 15

## 2023-02-18 NOTE — Patient Instructions (Signed)

## 2023-02-18 NOTE — Progress Notes (Signed)
Hematology/Oncology Consult note [facility]  [phone] [phone]  Patient Care Team: Doreene Nest, NP as PCP - General (Internal Medicine) Martin Majestic, MD as PCP - Hematology/Oncology Creig Hines, MD as Consulting Physician (Hematology and Oncology)   Name of the patient: James House  [id number]  [birth year]   Date of visit: 02/18/23  Diagnosis- kappa light chain multiple myeloma in relapse   Chief complaint/ Reason for visit-on treatment assessment prior to cycle 8-day 1 of carfilzomib  Heme/Onc history: Patient is a 51- year-old male with a history of kappa light chain myeloma that was treated by [name].  History is as follows:   1.  Patient presented with renal insufficiency with a creatinine of 2.4 in April 2018.  Kidney biopsy showed myeloma cast nephropathy.  Kappa light chain was 3004 and lambda 0.22 with a kappa lambda ratio of 13,000 655.  Skeletal survey showed multiple calvarial and marrow lesions.  Bone marrow biopsy in May 2018 showed 43% plasma cells.  He received CyBorD for cycle 1 in May 2018 and was subsequently switched to RVD.  He received 4 cycles followed by a repeat bone marrow biopsy in August 2018 which showed no increase in plasma cells.   2.  He underwent autologous stem cell transplantation on 01/30/2017.  He was started on Revlimid maintenance 10 mg daily in January 2019.   3.  Labs from January February and March 2019 done at [facility] showed no M spike, normal kappa lambda ratio of 1.06.  He was last seen by them in April 2019.   4. Following that patient was admitted to [facility] for healthcare associated pneumonia and was recently discharged.  He wished to transfer his care to [place] at this time.  He was on maintenance Revlimid 5 mg which was then reduced to 2.5 mg.  Follows up with pain clinic for chemo-induced peripheral neuropathy with [name].  His urine tested positive for [facility] and therefore  patient was discharged from pain clinic   5.  Noted to have gradually increasing free kappa light chainFrom 15 in 20 21-39.5 with a ratio of 3.09.  He was seen by [facility] again and underwent a repeat bone marrow biopsy which did not show any evidence of clonal plasma cells.  PET CT scan also did not show any new active lytic lesions in May 2023.Marland Kitchen  Revlimid dose was increased to 15 mg 3 weeks on 1 week off but kappa light chains show continuing to increase   6.  PET CT scan in December 2023 did not show any evidence of active myeloma.  Patient underwent repeat bone marrow biopsy at [facility] which showed 6% plasma cellsConsistent with persistent plasma cell neoplasm.  74 to 86% of isolated plasma cells show loss of T p53 and monosomy 13.  Patient was seen by [name] at [facility] and was recommended Darzalex Pomalyst dexamethasone regimen   7.  Disease progression after 8 weekly cycles of Darazalex kappa light chain increased from 332-646 with a ratio that went up from 39-57 concerning for disease progression.  Patient switched to carfilzomib Pomalyst dexamethasone regimen  Interval history-he has chronic fatigue and generalized bodyaches and joint pain especially back pain  ECOG PS- 1 Pain scale- 0   Review of systems- Review of Systems  Constitutional:  Positive for malaise/fatigue. Negative for chills, fever and weight loss.  HENT:  Negative for congestion, ear discharge and nosebleeds.   Eyes:  Negative for blurred  vision.  Respiratory:  Negative for cough, hemoptysis, sputum production, shortness of breath and wheezing.   Cardiovascular:  Negative for chest pain, palpitations, orthopnea and claudication.  Gastrointestinal:  Negative for abdominal pain, blood in stool, constipation, diarrhea, heartburn, melena, nausea and vomiting.  Genitourinary:  Negative for dysuria, flank pain, frequency, hematuria and urgency.  Musculoskeletal:  Positive for back pain and myalgias. Negative for joint pain.  Skin:   Negative for rash.  Neurological:  Negative for dizziness, tingling, focal weakness, seizures, weakness and headaches.  Endo/Heme/Allergies:  Does not bruise/bleed easily.  Psychiatric/Behavioral:  Negative for depression and suicidal ideas. The patient does not have insomnia.       No Known Allergies   Past Medical History:  Diagnosis Date   CKD (chronic kidney disease) stage 3, GFR 30-59 ml/min (HCC)    Elevated sedimentation rate 12/24/2017   Hypertension    Multiple myeloma (HCC) 08/11/2017   Neuropathy    Pneumonia    Severe sepsis (HCC) 08/11/2017   Shingles 08/03/2019     Past Surgical History:  Procedure Laterality Date   ABDOMINAL SURGERY     COLONOSCOPY WITH PROPOFOL N/A 05/25/2021   Procedure: COLONOSCOPY WITH PROPOFOL;  Surgeon: Toney Reil, MD;  Location[facility] ENDOSCOPY;  Service: Gastroenterology;  Laterality: N/A;   IR IMAGING GUIDED PORT INSERTION  08/02/2022   JOINT REPLACEMENT     right knee   KNEE SURGERY Left     Social History   Socioeconomic History   Marital status: Single    Spouse name: [name]   Number of children: 3   Years of education: Not on file   Highest education level: Not on file  Occupational History   Not on file  Tobacco Use   Smoking status: Former   Smokeless tobacco: Current    Types: Snuff  Vaping Use   Vaping status: Never Used  Substance and Sexual Activity   Alcohol use: No   Drug use: No   Sexual activity: Yes  Other Topics Concern   Not on file  Social History Narrative   Live in private residence with spouse and mother in law   Social Determinants of Health   Financial Resource Strain: Low Risk  (08/12/2017)   Overall Financial Resource Strain (CARDIA)    Difficulty of Paying Living Expenses: Not hard at all  Food Insecurity: No Food Insecurity (08/12/2017)   Hunger Vital Sign    Worried About Running Out of Food in the Last Year: Never true    Ran Out of Food in the Last Year: Never true   Transportation Needs: No Transportation Needs (08/12/2017)   PRAPARE - Administrator, Civil Service (Medical): No    Lack of Transportation (Non-Medical): No  Physical Activity: Sufficiently Active (08/12/2017)   Exercise Vital Sign    Days of Exercise per Week: 7 days    Minutes of Exercise per Session: 30 min  Stress: No Stress Concern Present (08/12/2017)   [employer] of Occupational Health - Occupational Stress Questionnaire    Feeling of Stress : Only a little  Social Connections: Somewhat Isolated (08/12/2017)   Social Connection and Isolation Panel [NHANES]    Frequency of Communication with Friends and Family: Once a week    Frequency of Social Gatherings with Friends and Family: Once a week    Attends Religious Services: More than 4 times per year    Active Member of [employer] or Organizations: No    Attends Banker  Meetings: Never    Marital Status: Married  Catering manager Violence: Not At Risk (08/12/2017)   Humiliation, Afraid, Rape, and Kick questionnaire    Fear of Current or Ex-Partner: No    Emotionally Abused: No    Physically Abused: No    Sexually Abused: No    Family History  Problem Relation Age of Onset   Heart disease Mother    Cancer Mother 48       Pancreatic   COPD Mother    Diabetes Mother    Hyperlipidemia Mother    Hypertension Mother    Cancer Maternal Uncle        pancreatic   Heart disease Maternal Grandmother    Cancer Maternal Grandmother 62       colon   COPD Maternal Grandmother    Diabetes Maternal Grandmother    Hypertension Maternal Grandmother      Current Outpatient Medications:    acyclovir (ZOVIRAX) 400 MG tablet, Take 1 tablet (400 mg total) by mouth 2 (two) times daily., Disp: 60 tablet, Rfl: 2   albuterol (VENTOLIN HFA) 108 (90 Base) MCG/ACT inhaler, Inhale 2 puffs into the lungs every 6 (six) hours as needed for wheezing or shortness of breath., Disp: 6.7 g, Rfl: 2   amitriptyline (ELAVIL) 25  MG tablet, Take 1 tablet (25 mg total) by mouth at bedtime. For headache prevention and sleep, Disp: 90 tablet, Rfl: 0   amLODipine-benazepril (LOTREL) 10-40 MG capsule, Take 1 capsule by mouth daily. for blood pressure., Disp: 90 capsule, Rfl: 0   amoxicillin-clavulanate (AUGMENTIN) 875-125 MG tablet, Take 1 tablet by mouth 2 (two) times daily. (Patient not taking: Reported on 11/28/2022), Disp: 14 tablet, Rfl: 0   aspirin EC 81 MG tablet, Take by mouth., Disp: , Rfl:    baclofen (LIORESAL) 10 MG tablet, Take 1 tablet (10 mg total) by mouth 4 (four) times daily as needed for muscle spasms., Disp: 360 tablet, Rfl: 0   carvedilol (COREG) 6.25 MG tablet, Take 1 tablet (6.25 mg total) by mouth 2 (two) times daily., Disp: 180 tablet, Rfl: 3   celecoxib (CELEBREX) 200 MG capsule, Take 1 capsule (200 mg total) by mouth daily., Disp: 30 capsule, Rfl: 2   cyanocobalamin (VITAMIN B12) 1000 MCG/ML injection, Inject 1 mL (1,000 mcg total) into the muscle every 30 (thirty) days., Disp: 1 mL, Rfl: 11   DULoxetine (CYMBALTA) 60 MG capsule, Take 1 capsule (60 mg total) by mouth daily. (Patient not taking: Reported on 02/06/2023), Disp: 60 capsule, Rfl: 1   levothyroxine (SYNTHROID) 50 MCG tablet, Take 1 tablet (50 mcg total) by mouth daily before breakfast., Disp: 30 tablet, Rfl: 2   lidocaine-prilocaine (EMLA) cream, Apply 1 Application topically as needed., Disp: 30 g, Rfl: 0   montelukast (SINGULAIR) 10 MG tablet, Take 1 tablet in the evening 1 day before your treatment. Day of treatment take 1 tablet in the morning for the first 2 treatments of Daratumumab, Disp: 4 tablet, Rfl: 0   OLANZapine (ZYPREXA) 10 MG tablet, Take 1 tablet (10 mg total) by mouth at bedtime. To help with nausea prevention., Disp: 30 tablet, Rfl: 1   OLANZapine (ZYPREXA) 10 MG tablet, Take by mouth. (Patient not taking: Reported on 12/25/2022), Disp: , Rfl:    oxyCODONE ER (XTAMPZA ER) 9 MG C12A, Take 9 mg by mouth every 12 (twelve) hours.,  Disp: 60 capsule, Rfl: 0   Oxycodone HCl 10 MG TABS, Take 1 tablet (10 mg total) by mouth every 8 (eight)  hours as needed., Disp: 90 tablet, Rfl: 0   pomalidomide (POMALYST) 2 MG capsule, Take 1 capsule (2 mg total) by mouth daily. AND 7 DAYS OFF.  CELGENE-REM : [id number]   DONE ON 01/17/2023, Disp: 21 capsule, Rfl: 0   potassium chloride SA (KLOR-CON M) 20 MEQ tablet, Take 2 tablets (40 mEq total) by mouth 2 (two) times daily., Disp: 120 tablet, Rfl: 0   pregabalin (LYRICA) 300 MG capsule, Take 1 capsule (300 mg total) by mouth 2 (two) times daily., Disp: 60 capsule, Rfl: 3   prochlorperazine (COMPAZINE) 10 MG tablet, Take by mouth., Disp: , Rfl:   Physical exam:  Vitals:   02/18/23 0901 02/18/23 0903  BP: (!) 143/99 (!) 131/94  Pulse: 88   Resp: 18   Temp: 97.8 F (36.6 C)   TempSrc: Tympanic   SpO2: 100%   Weight: 174 lb (78.9 kg)    Physical Exam Cardiovascular:     Rate and Rhythm: Normal rate and regular rhythm.     Heart sounds: Normal heart sounds.  Pulmonary:     Effort: Pulmonary effort is normal.     Breath sounds: Normal breath sounds.  Skin:    General: Skin is warm and dry.  Neurological:     Mental Status: He is alert and oriented to person, place, and time.         Latest Ref Rng & Units 02/18/2023    8:28 AM  CMP  Glucose 70 - 99 mg/dL 308   BUN 6 - 20 mg/dL 17   Creatinine 6.57 - 1.24 mg/dL 8.46   Sodium 962 - 952 mmol/L 137   Potassium 3.5 - 5.1 mmol/L 3.4   Chloride 98 - 111 mmol/L 105   CO2 22 - 32 mmol/L 23   Calcium 8.9 - 10.3 mg/dL 8.7   Total Protein 6.5 - 8.1 g/dL 5.7   Total Bilirubin 0.3 - 1.2 mg/dL 0.5   Alkaline Phos 38 - 126 U/L 89   AST 15 - 41 U/L 25   ALT 0 - 44 U/L 18       Latest Ref Rng & Units 02/18/2023    8:28 AM  CBC  WBC 4.0 - 10.5 K/uL 4.8   Hemoglobin 13.0 - 17.0 g/dL 84.1   Hematocrit 32.4 - 52.0 % 39.1   Platelets 150 - 400 K/uL 216     No images are attached to the encounter.  No results found.   Assessment  and plan- Patient is a 52 y.o. male  with history of kappa light chain multiple myeloma currently in partial remission.  He is here for on treatment assessment prior to cycle 8-day 1 of carfilzomib  Patient will continue Pomalyst 2 mg 3 weeks on and 1 week off.  He is on carfilzomib 20 mg/m 3 weeks on and 1 week off.  Serum free kappa light chain is mildly increasing over the last 3 months.  No M protein detected on SPEP.  Renal functions are stable.  He has chronic CKD with a creatinine that fluctuates around 1.5.  Hemoglobin and serum calcium currently normal.  I will see him back in 2 weeks for cycle 8-day 15.  He also has an appointment with [facility] next month.  Continue aspirin for thromboprophylaxis.  He is not on bone prophylaxis.  Hypertension better controlled after starting Coreg and he follows up with cardiology   Visit Diagnosis 1. Multiple myeloma in relapse (HCC)   2. High risk medication use  3. Encounter for antineoplastic chemotherapy      Dr. Owens Shark, MD, MPH [facility] [phone] 02/18/2023 9:20 AM

## 2023-02-19 LAB — KAPPA/LAMBDA LIGHT CHAINS
Kappa free light chain: 42.5 mg/L — ABNORMAL HIGH (ref 3.3–19.4)
Kappa, lambda light chain ratio: 4.34 — ABNORMAL HIGH (ref 0.26–1.65)
Lambda free light chains: 9.8 mg/L (ref 5.7–26.3)

## 2023-02-21 LAB — MULTIPLE MYELOMA PANEL, SERUM
Albumin SerPl Elph-Mcnc: 3.2 g/dL (ref 2.9–4.4)
Albumin/Glob SerPl: 1.7 (ref 0.7–1.7)
Alpha 1: 0.1 g/dL (ref 0.0–0.4)
Alpha2 Glob SerPl Elph-Mcnc: 0.7 g/dL (ref 0.4–1.0)
B-Globulin SerPl Elph-Mcnc: 0.7 g/dL (ref 0.7–1.3)
Gamma Glob SerPl Elph-Mcnc: 0.4 g/dL (ref 0.4–1.8)
Globulin, Total: 2 g/dL — ABNORMAL LOW (ref 2.2–3.9)
IgA: 61 mg/dL — ABNORMAL LOW (ref 90–386)
IgG (Immunoglobin G), Serum: 493 mg/dL — ABNORMAL LOW (ref 603–1613)
IgM (Immunoglobulin M), Srm: 52 mg/dL (ref 20–172)
Total Protein ELP: 5.2 g/dL — ABNORMAL LOW (ref 6.0–8.5)

## 2023-02-23 ENCOUNTER — Other Ambulatory Visit: Payer: Self-pay

## 2023-02-24 MED FILL — Dexamethasone Sodium Phosphate Inj 100 MG/10ML: INTRAMUSCULAR | Qty: 2 | Status: AC

## 2023-02-25 ENCOUNTER — Inpatient Hospital Stay: Payer: BC Managed Care – PPO | Attending: Internal Medicine

## 2023-02-25 ENCOUNTER — Inpatient Hospital Stay: Payer: BC Managed Care – PPO

## 2023-02-25 DIAGNOSIS — Z5112 Encounter for antineoplastic immunotherapy: Secondary | ICD-10-CM | POA: Insufficient documentation

## 2023-02-25 DIAGNOSIS — C9002 Multiple myeloma in relapse: Secondary | ICD-10-CM | POA: Insufficient documentation

## 2023-02-25 DIAGNOSIS — Z79899 Other long term (current) drug therapy: Secondary | ICD-10-CM | POA: Insufficient documentation

## 2023-02-25 LAB — CBC WITH DIFFERENTIAL (CANCER CENTER ONLY)
Abs Immature Granulocytes: 0.04 10*3/uL (ref 0.00–0.07)
Basophils Absolute: 0 10*3/uL (ref 0.0–0.1)
Basophils Relative: 0 %
Eosinophils Absolute: 0.3 10*3/uL (ref 0.0–0.5)
Eosinophils Relative: 6 %
HCT: 40.6 % (ref 39.0–52.0)
Hemoglobin: 13.9 g/dL (ref 13.0–17.0)
Immature Granulocytes: 1 %
Lymphocytes Relative: 24 %
Lymphs Abs: 1.2 10*3/uL (ref 0.7–4.0)
MCH: 32.2 pg (ref 26.0–34.0)
MCHC: 34.2 g/dL (ref 30.0–36.0)
MCV: 94 fL (ref 80.0–100.0)
Monocytes Absolute: 0.4 10*3/uL (ref 0.1–1.0)
Monocytes Relative: 8 %
Neutro Abs: 2.9 10*3/uL (ref 1.7–7.7)
Neutrophils Relative %: 61 %
Platelet Count: 190 10*3/uL (ref 150–400)
RBC: 4.32 MIL/uL (ref 4.22–5.81)
RDW: 13.4 % (ref 11.5–15.5)
WBC Count: 4.8 10*3/uL (ref 4.0–10.5)
nRBC: 0 % (ref 0.0–0.2)

## 2023-02-25 LAB — CMP (CANCER CENTER ONLY)
ALT: 18 U/L (ref 0–44)
AST: 21 U/L (ref 15–41)
Albumin: 3.6 g/dL (ref 3.5–5.0)
Alkaline Phosphatase: 82 U/L (ref 38–126)
Anion gap: 8 (ref 5–15)
BUN: 17 mg/dL (ref 6–20)
CO2: 23 mmol/L (ref 22–32)
Calcium: 8.6 mg/dL — ABNORMAL LOW (ref 8.9–10.3)
Chloride: 104 mmol/L (ref 98–111)
Creatinine: 1.54 mg/dL — ABNORMAL HIGH (ref 0.61–1.24)
GFR, Estimated: 54 mL/min — ABNORMAL LOW (ref >60)
Glucose, Bld: 134 mg/dL — ABNORMAL HIGH (ref 70–99)
Potassium: 3.3 mmol/L — ABNORMAL LOW (ref 3.5–5.1)
Sodium: 135 mmol/L (ref 135–145)
Total Bilirubin: 0.9 mg/dL (ref <1.2)
Total Protein: 5.9 g/dL — ABNORMAL LOW (ref 6.5–8.1)

## 2023-02-25 MED ORDER — SODIUM CHLORIDE 0.9 % IV SOLN
Freq: Once | INTRAVENOUS | Status: AC
Start: 2023-02-25 — End: 2023-02-25
  Filled 2023-02-25: qty 250

## 2023-02-25 MED ORDER — HEPARIN SOD (PORK) LOCK FLUSH 100 UNIT/ML IV SOLN
500.0000 [IU] | Freq: Once | INTRAVENOUS | Status: AC | PRN
  Administered 2023-02-25: 500 [IU]
  Filled 2023-02-25: qty 5

## 2023-02-25 MED ORDER — SODIUM CHLORIDE 0.9% FLUSH
10.0000 mL | INTRAVENOUS | Status: DC | PRN
  Administered 2023-02-25: 10 mL
  Filled 2023-02-25: qty 10

## 2023-02-25 MED ORDER — DEXTROSE 5 % IV SOLN
20.0000 mg/m2 | Freq: Once | INTRAVENOUS | Status: AC
  Administered 2023-02-25: 36 mg via INTRAVENOUS
  Filled 2023-02-25: qty 15

## 2023-02-25 MED ORDER — SODIUM CHLORIDE 0.9 % IV SOLN
20.0000 mg | Freq: Once | INTRAVENOUS | Status: AC
  Administered 2023-02-25: 20 mg via INTRAVENOUS
  Filled 2023-02-25: qty 20

## 2023-02-25 MED ORDER — SODIUM CHLORIDE 0.9 % IV SOLN
Freq: Once | INTRAVENOUS | Status: AC
  Filled 2023-02-25: qty 250

## 2023-02-25 NOTE — Patient Instructions (Signed)
[facility] - A DEPT OF [facility]  Discharge Instructions: Thank you for choosing [facility] to provide your oncology and hematology care.  If you have a lab appointment with the Cancer Center, please go directly to the Cancer Center and check in at the registration area.  Wear comfortable clothing and clothing appropriate for easy access to any Portacath or PICC line.   We strive to give you quality time with your provider. You may need to reschedule your appointment if you arrive late (15 or more minutes).  Arriving late affects you and other patients whose appointments are after yours.  Also, if you miss three or more appointments without notifying the office, you may be dismissed from the clinic at the provider's discretion.      For prescription refill requests, have your pharmacy contact our office and allow 72 hours for refills to be completed.    Today you received the following chemotherapy and/or immunotherapy agents- Kyprolis      To help prevent nausea and vomiting after your treatment, we encourage you to take your nausea medication as directed.  BELOW ARE SYMPTOMS THAT SHOULD BE REPORTED IMMEDIATELY: *FEVER GREATER THAN 100.4 F (38 C) OR HIGHER *CHILLS OR SWEATING *NAUSEA AND VOMITING THAT IS NOT CONTROLLED WITH YOUR NAUSEA MEDICATION *UNUSUAL SHORTNESS OF BREATH *UNUSUAL BRUISING OR BLEEDING *URINARY PROBLEMS (pain or burning when urinating, or frequent urination) *BOWEL PROBLEMS (unusual diarrhea, constipation, pain near the anus) TENDERNESS IN MOUTH AND THROAT WITH OR WITHOUT PRESENCE OF ULCERS (sore throat, sores in mouth, or a toothache) UNUSUAL RASH, SWELLING OR PAIN  UNUSUAL VAGINAL DISCHARGE OR ITCHING   Items with * indicate a potential emergency and should be followed up as soon as possible or go to the Emergency Department if any problems should occur.  Please show the CHEMOTHERAPY ALERT CARD or IMMUNOTHERAPY  ALERT CARD at check-in to the Emergency Department and triage nurse.  Should you have questions after your visit or need to cancel or reschedule your appointment, please contact [facility] - A DEPT OF [facility]  [phone] and follow the prompts.  Office hours are 8:00 a.m. to 4:30 p.m. Monday - Friday. Please note that voicemails left after 4:00 p.m. may not be returned until the following business day.  We are closed weekends and major holidays. You have access to a nurse at all times for urgent questions. Please call the main number to the clinic [phone] and follow the prompts.  For any non-urgent questions, you may also contact your provider using MyChart. We now offer e-Visits for anyone 66 and older to request care online for non-urgent symptoms. For details visit [email or web address].   Also download the MyChart app! Go to the app store, search "MyChart", open the app, select , and log in with your MyChart username and password.

## 2023-02-26 ENCOUNTER — Other Ambulatory Visit: Payer: Self-pay

## 2023-03-04 ENCOUNTER — Encounter: Payer: Self-pay | Admitting: Oncology

## 2023-03-04 ENCOUNTER — Inpatient Hospital Stay: Payer: BC Managed Care – PPO

## 2023-03-04 ENCOUNTER — Inpatient Hospital Stay (HOSPITAL_BASED_OUTPATIENT_CLINIC_OR_DEPARTMENT_OTHER): Payer: BC Managed Care – PPO | Admitting: Oncology

## 2023-03-04 ENCOUNTER — Other Ambulatory Visit: Payer: Self-pay

## 2023-03-04 VITALS — BP 134/91 | HR 75

## 2023-03-04 VITALS — BP 140/98 | HR 98 | Temp 97.6°F | Resp 20 | Wt 173.6 lb

## 2023-03-04 DIAGNOSIS — C9002 Multiple myeloma in relapse: Secondary | ICD-10-CM | POA: Diagnosis not present

## 2023-03-04 DIAGNOSIS — T451X5A Adverse effect of antineoplastic and immunosuppressive drugs, initial encounter: Secondary | ICD-10-CM

## 2023-03-04 DIAGNOSIS — Z79899 Other long term (current) drug therapy: Secondary | ICD-10-CM | POA: Diagnosis not present

## 2023-03-04 DIAGNOSIS — C9000 Multiple myeloma not having achieved remission: Secondary | ICD-10-CM

## 2023-03-04 DIAGNOSIS — G620 Drug-induced polyneuropathy: Secondary | ICD-10-CM | POA: Diagnosis not present

## 2023-03-04 DIAGNOSIS — Z5112 Encounter for antineoplastic immunotherapy: Secondary | ICD-10-CM | POA: Diagnosis not present

## 2023-03-04 DIAGNOSIS — Z5111 Encounter for antineoplastic chemotherapy: Secondary | ICD-10-CM

## 2023-03-04 LAB — CMP (CANCER CENTER ONLY)
ALT: 20 U/L (ref 0–44)
AST: 23 U/L (ref 15–41)
Albumin: 3.6 g/dL (ref 3.5–5.0)
Alkaline Phosphatase: 85 U/L (ref 38–126)
Anion gap: 8 (ref 5–15)
BUN: 12 mg/dL (ref 6–20)
CO2: 22 mmol/L (ref 22–32)
Calcium: 8.8 mg/dL — ABNORMAL LOW (ref 8.9–10.3)
Chloride: 105 mmol/L (ref 98–111)
Creatinine: 1.49 mg/dL — ABNORMAL HIGH (ref 0.61–1.24)
GFR, Estimated: 56 mL/min — ABNORMAL LOW (ref >60)
Glucose, Bld: 134 mg/dL — ABNORMAL HIGH (ref 70–99)
Potassium: 3.6 mmol/L (ref 3.5–5.1)
Sodium: 135 mmol/L (ref 135–145)
Total Bilirubin: 1.1 mg/dL (ref <1.2)
Total Protein: 6.1 g/dL — ABNORMAL LOW (ref 6.5–8.1)

## 2023-03-04 LAB — CBC WITH DIFFERENTIAL (CANCER CENTER ONLY)
Abs Immature Granulocytes: 0.11 10*3/uL — ABNORMAL HIGH (ref 0.00–0.07)
Basophils Absolute: 0 10*3/uL (ref 0.0–0.1)
Basophils Relative: 1 %
Eosinophils Absolute: 0.4 10*3/uL (ref 0.0–0.5)
Eosinophils Relative: 5 %
HCT: 40.7 % (ref 39.0–52.0)
Hemoglobin: 14.5 g/dL (ref 13.0–17.0)
Immature Granulocytes: 1 %
Lymphocytes Relative: 16 %
Lymphs Abs: 1.4 10*3/uL (ref 0.7–4.0)
MCH: 32.9 pg (ref 26.0–34.0)
MCHC: 35.6 g/dL (ref 30.0–36.0)
MCV: 92.3 fL (ref 80.0–100.0)
Monocytes Absolute: 0.6 10*3/uL (ref 0.1–1.0)
Monocytes Relative: 7 %
Neutro Abs: 6.3 10*3/uL (ref 1.7–7.7)
Neutrophils Relative %: 70 %
Platelet Count: 177 10*3/uL (ref 150–400)
RBC: 4.41 MIL/uL (ref 4.22–5.81)
RDW: 13.6 % (ref 11.5–15.5)
WBC Count: 8.8 10*3/uL (ref 4.0–10.5)
nRBC: 0 % (ref 0.0–0.2)

## 2023-03-04 MED ORDER — DEXTROSE 5 % IV SOLN
20.0000 mg/m2 | Freq: Once | INTRAVENOUS | Status: AC
  Administered 2023-03-04: 36 mg via INTRAVENOUS
  Filled 2023-03-04: qty 15

## 2023-03-04 MED ORDER — HEPARIN SOD (PORK) LOCK FLUSH 100 UNIT/ML IV SOLN
500.0000 [IU] | Freq: Once | INTRAVENOUS | Status: AC | PRN
  Administered 2023-03-04: 500 [IU]
  Filled 2023-03-04: qty 5

## 2023-03-04 MED ORDER — SODIUM CHLORIDE 0.9 % IV SOLN
Freq: Once | INTRAVENOUS | Status: AC
  Filled 2023-03-04: qty 250

## 2023-03-04 MED ORDER — DEXAMETHASONE SODIUM PHOSPHATE 10 MG/ML IJ SOLN
10.0000 mg | Freq: Once | INTRAMUSCULAR | Status: AC
  Administered 2023-03-04: 10 mg via INTRAVENOUS
  Filled 2023-03-04: qty 1

## 2023-03-04 MED ORDER — SODIUM CHLORIDE 0.9% FLUSH
10.0000 mL | INTRAVENOUS | Status: DC | PRN
Start: 2023-03-04 — End: 2023-03-04
  Administered 2023-03-04: 10 mL
  Filled 2023-03-04: qty 10

## 2023-03-04 MED ORDER — SODIUM CHLORIDE 0.9 % IV SOLN
10.0000 mg | Freq: Once | INTRAVENOUS | Status: DC
Start: 2023-03-04 — End: 2023-03-04

## 2023-03-04 NOTE — Progress Notes (Signed)
Hematology/Oncology Consult note [facility]  Telephone:([phone] Fax:([phone]) [phone]  Patient Care Team: Doreene Nest, NP as PCP - General (Internal Medicine) Martin Majestic, MD as PCP - Hematology/Oncology Creig Hines, MD as Consulting Physician (Hematology and Oncology)   Name of the patient: James House  [id number]  Aug 06, 1970   Date of visit: 03/04/23  Diagnosis- kappa light chain multiple myeloma in relapse   Chief complaint/ Reason for visit-on treatment assessment prior to cycle 8-day 15 of carfilzomib  Heme/Onc history: Patient is a 52- year-old male with a history of kappa light chain myeloma that was treated by [name].  History is as follows:   1.  Patient presented with renal insufficiency with a creatinine of 2.4 in April 2018.  Kidney biopsy showed myeloma cast nephropathy.  Kappa light chain was 3004 and lambda 0.22 with a kappa lambda ratio of 13,000 655.  Skeletal survey showed multiple calvarial and marrow lesions.  Bone marrow biopsy in May 2018 showed 43% plasma cells.  He received CyBorD for cycle 1 in May 2018 and was subsequently switched to RVD.  He received 4 cycles followed by a repeat bone marrow biopsy in August 2018 which showed no increase in plasma cells.   2.  He underwent autologous stem cell transplantation on 01/30/2017.  He was started on Revlimid maintenance 10 mg daily in January 2019.   3.  Labs from January February and March 2019 done at [facility] showed no M spike, normal kappa lambda ratio of 1.06.  He was last seen by them in April 2019.   4. Following that patient was admitted to [facility] for healthcare associated pneumonia and was recently discharged.  He wished to transfer his care to [place] at this time.  He was on maintenance Revlimid 5 mg which was then reduced to 2.5 mg.  Follows up with pain clinic for chemo-induced peripheral neuropathy with [name].  His urine tested positive for [facility] and therefore  patient was discharged from pain clinic   5.  Noted to have gradually increasing free kappa light chainFrom 15 in 20 21-39.5 with a ratio of 3.09.  He was seen by [facility] again and underwent a repeat bone marrow biopsy which did not show any evidence of clonal plasma cells.  PET CT scan also did not show any new active lytic lesions in May 2023.Marland Kitchen  Revlimid dose was increased to 15 mg 3 weeks on 1 week off but kappa light chains show continuing to increase   6.  PET CT scan in December 2023 did not show any evidence of active myeloma.  Patient underwent repeat bone marrow biopsy at [facility] which showed 6% plasma cellsConsistent with persistent plasma cell neoplasm.  74 to 86% of isolated plasma cells show loss of T p53 and monosomy 13.  Patient was seen by [name] at [facility] and was recommended Darzalex Pomalyst dexamethasone regimen   7.  Disease progression after 8 weekly cycles of Darazalex kappa light chain increased from 332-646 with a ratio that went up from 39-57 concerning for disease progression.  Patient switched to carfilzomib Pomalyst dexamethasone regimen    Interval history-patient has chronic peripheral neuropathy as well as chronic Back pain for which he uses Xtampza and as needed oxycodone.  He feels that the present dose of oxycodone is not working for him.  ECOG PS- 1 Pain scale- 5 Opioid associated constipation- no  Review of systems- Review of Systems  Constitutional:  Positive  for malaise/fatigue.  Musculoskeletal:  Positive for back pain.  Neurological:  Positive for sensory change (Peripheral neuropathy).      No Known Allergies   Past Medical History:  Diagnosis Date   CKD (chronic kidney disease) stage 3, GFR 30-59 ml/min (HCC)    Elevated sedimentation rate 12/24/2017   Hypertension    Multiple myeloma (HCC) 08/11/2017   Neuropathy    Pneumonia    Severe sepsis (HCC) 08/11/2017   Shingles 08/03/2019     Past Surgical History:  Procedure Laterality Date    ABDOMINAL SURGERY     COLONOSCOPY WITH PROPOFOL N/A 05/25/2021   Procedure: COLONOSCOPY WITH PROPOFOL;  Surgeon: Toney Reil, MD;  Location: [facility] ENDOSCOPY;  Service: Gastroenterology;  Laterality: N/A;   IR IMAGING GUIDED PORT INSERTION  08/02/2022   JOINT REPLACEMENT     right knee   KNEE SURGERY Left     Social History   Socioeconomic History   Marital status: Single    Spouse name: James House   Number of children: 3   Years of education: Not on file   Highest education level: Not on file  Occupational History   Not on file  Tobacco Use   Smoking status: Former   Smokeless tobacco: Current    Types: Snuff  Vaping Use   Vaping status: Never Used  Substance and Sexual Activity   Alcohol use: No   Drug use: No   Sexual activity: Yes  Other Topics Concern   Not on file  Social History Narrative   Live in private residence with spouse and mother in law   Social Determinants of Health   Financial Resource Strain: Low Risk  (08/12/2017)   Overall Financial Resource Strain (CARDIA)    Difficulty of Paying Living Expenses: Not hard at all  Food Insecurity: No Food Insecurity (08/12/2017)   Hunger Vital Sign    Worried About Running Out of Food in the Last Year: Never true    Ran Out of Food in the Last Year: Never true  Transportation Needs: No Transportation Needs (08/12/2017)   PRAPARE - Administrator, Civil Service (Medical): No    Lack of Transportation (Non-Medical): No  Physical Activity: Sufficiently Active (08/12/2017)   Exercise Vital Sign    Days of Exercise per Week: 7 days    Minutes of Exercise per Session: 30 min  Stress: No Stress Concern Present (08/12/2017)   [employer] of Occupational Health - Occupational Stress Questionnaire    Feeling of Stress : Only a little  Social Connections: Somewhat Isolated (08/12/2017)   Social Connection and Isolation Panel [NHANES]    Frequency of Communication with Friends and Family: Once a week     Frequency of Social Gatherings with Friends and Family: Once a week    Attends Religious Services: More than 4 times per year    Active Member of [employer] or Organizations: No    Attends Banker Meetings: Never    Marital Status: Married  Catering manager Violence: Not At Risk (08/12/2017)   Humiliation, Afraid, Rape, and Kick questionnaire    Fear of Current or Ex-Partner: No    Emotionally Abused: No    Physically Abused: No    Sexually Abused: No    Family History  Problem Relation Age of Onset   Heart disease Mother    Cancer Mother 42       Pancreatic   COPD Mother    Diabetes Mother    Hyperlipidemia  Mother    Hypertension Mother    Cancer Maternal Uncle        pancreatic   Heart disease Maternal Grandmother    Cancer Maternal Grandmother 37       colon   COPD Maternal Grandmother    Diabetes Maternal Grandmother    Hypertension Maternal Grandmother      Current Outpatient Medications:    acyclovir (ZOVIRAX) 400 MG tablet, Take 1 tablet (400 mg total) by mouth 2 (two) times daily., Disp: 60 tablet, Rfl: 2   albuterol (VENTOLIN HFA) 108 (90 Base) MCG/ACT inhaler, Inhale 2 puffs into the lungs every 6 (six) hours as needed for wheezing or shortness of breath., Disp: 6.7 g, Rfl: 2   amitriptyline (ELAVIL) 25 MG tablet, Take 1 tablet (25 mg total) by mouth at bedtime. For headache prevention and sleep, Disp: 90 tablet, Rfl: 0   amLODipine-benazepril (LOTREL) 10-40 MG capsule, Take 1 capsule by mouth daily. for blood pressure., Disp: 90 capsule, Rfl: 0   amoxicillin-clavulanate (AUGMENTIN) 875-125 MG tablet, Take 1 tablet by mouth 2 (two) times daily. (Patient not taking: Reported on 11/28/2022), Disp: 14 tablet, Rfl: 0   aspirin EC 81 MG tablet, Take by mouth., Disp: , Rfl:    baclofen (LIORESAL) 10 MG tablet, Take 1 tablet (10 mg total) by mouth 4 (four) times daily as needed for muscle spasms., Disp: 360 tablet, Rfl: 0   carvedilol (COREG) 6.25 MG tablet, Take 1  tablet (6.25 mg total) by mouth 2 (two) times daily., Disp: 180 tablet, Rfl: 3   celecoxib (CELEBREX) 200 MG capsule, Take 1 capsule (200 mg total) by mouth daily., Disp: 30 capsule, Rfl: 2   cyanocobalamin (VITAMIN B12) 1000 MCG/ML injection, Inject 1 mL (1,000 mcg total) into the muscle every 30 (thirty) days., Disp: 1 mL, Rfl: 11   DULoxetine (CYMBALTA) 60 MG capsule, Take 1 capsule (60 mg total) by mouth daily. (Patient not taking: Reported on 02/06/2023), Disp: 60 capsule, Rfl: 1   levothyroxine (SYNTHROID) 50 MCG tablet, Take 1 tablet (50 mcg total) by mouth daily before breakfast., Disp: 30 tablet, Rfl: 2   lidocaine-prilocaine (EMLA) cream, Apply 1 Application topically as needed., Disp: 30 g, Rfl: 0   montelukast (SINGULAIR) 10 MG tablet, Take 1 tablet in the evening 1 day before your treatment. Day of treatment take 1 tablet in the morning for the first 2 treatments of Daratumumab, Disp: 4 tablet, Rfl: 0   OLANZapine (ZYPREXA) 10 MG tablet, Take 1 tablet (10 mg total) by mouth at bedtime. To help with nausea prevention., Disp: 30 tablet, Rfl: 1   OLANZapine (ZYPREXA) 10 MG tablet, Take by mouth. (Patient not taking: Reported on 12/25/2022), Disp: , Rfl:    oxyCODONE ER (XTAMPZA ER) 9 MG C12A, Take 9 mg by mouth every 12 (twelve) hours., Disp: 60 capsule, Rfl: 0   Oxycodone HCl 10 MG TABS, Take 1 tablet (10 mg total) by mouth every 8 (eight) hours as needed., Disp: 90 tablet, Rfl: 0   pomalidomide (POMALYST) 2 MG capsule, Take 1 capsule (2 mg total) by mouth daily. AND 7 DAYS OFF.  CELGENE-REM : [id number]   DONE ON 01/17/2023, Disp: 21 capsule, Rfl: 0   potassium chloride SA (KLOR-CON M) 20 MEQ tablet, Take 2 tablets (40 mEq total) by mouth 2 (two) times daily., Disp: 120 tablet, Rfl: 0   pregabalin (LYRICA) 300 MG capsule, Take 1 capsule (300 mg total) by mouth 2 (two) times daily., Disp: 60 capsule, Rfl: 3  prochlorperazine (COMPAZINE) 10 MG tablet, Take by mouth., Disp: , Rfl:   Physical  exam: There were no vitals filed for this visit. Physical Exam Cardiovascular:     Rate and Rhythm: Normal rate and regular rhythm.     Heart sounds: Normal heart sounds.  Pulmonary:     Effort: Pulmonary effort is normal.     Breath sounds: Normal breath sounds.  Skin:    General: Skin is warm and dry.  Neurological:     Mental Status: He is alert and oriented to person, place, and time.         Latest Ref Rng & Units 02/25/2023    8:19 AM  CMP  Glucose 70 - 99 mg/dL 161   BUN 6 - 20 mg/dL 17   Creatinine 0.96 - 1.24 mg/dL 0.45   Sodium 409 - 811 mmol/L 135   Potassium 3.5 - 5.1 mmol/L 3.3   Chloride 98 - 111 mmol/L 104   CO2 22 - 32 mmol/L 23   Calcium 8.9 - 10.3 mg/dL 8.6   Total Protein 6.5 - 8.1 g/dL 5.9   Total Bilirubin <9.1 mg/dL 0.9   Alkaline Phos 38 - 126 U/L 82   AST 15 - 41 U/L 21   ALT 0 - 44 U/L 18       Latest Ref Rng & Units 02/25/2023    8:19 AM  CBC  WBC 4.0 - 10.5 K/uL 4.8   Hemoglobin 13.0 - 17.0 g/dL 47.8   Hematocrit 29.5 - 52.0 % 40.6   Platelets 150 - 400 K/uL 190     No images are attached to the encounter.  No results found.   Assessment and plan- Patient is a 52 y.o. male with history of kappa light chain multiple myeloma currently in partial remission.  He is here for on treatment assessment prior to cycle 8-day 15 of carfilzomib  Counts okay to proceed with cycle 8-day 15 of carfilzomib today.  He is on Pomalyst 2 mg 3 weeks on and 1 week off as he could not tolerate higher dose due to worsening fatigue.  He is also receiving reduced dose of carfilzomib 20 mg/m 3 weeks on and 1 week off.  Renal functions are presently stable.  Serum free kappa light chain has gradually increased over the last 4 months from 21-42 presently and ratio has increased from 2.9-4.3 presently.  This is concerning for gradual progression.  Myeloma panel shows no M protein.  He has an upcoming appointment with Dr. Pandora Leiter in 2 weeks time.  If he progresses on this  regimen he will need to be considered for CAR-T cell therapy versus bispecific's treatment  Blood pressure is 140/98 in my office today but patient states that he has not taken one of his blood pressure medications.  Blood pressure is better controlled at home.  He is also following up with cardiology and will be getting sleep study.  I plan to repeat echocardiogram sometime in the next 2 weeks.  Continue aspirin and acyclovir prophylaxis.  He will directly proceed for cycle 9-day 1 of carfilzomib in 2 weeks and I will see him back in 4 weeks for Cycle 9-day 8 of treatment.  Patient has chronic chemo-induced peripheral neuropathy as well as nonmalignant back pain for which she is on Xtampza and as needed oxycodone.  Pain not adequately controlled with 10 mg as needed oxycodone and I will give him a new prescription for 15 mg every 8 as needed after  2 to 3 days   Visit Diagnosis 1. Encounter for antineoplastic chemotherapy   2. High risk medication use   3. Multiple myeloma not having achieved remission (HCC)      Dr. Owens Shark, MD, MPH [facility] [phone] 03/04/2023 8:32 AM

## 2023-03-04 NOTE — Patient Instructions (Signed)
 [facility] - [facility]  Discharge Instructions: Thank you for choosing [facility] to provide your oncology and hematology care.  If you have a lab appointment with the Cancer Center, please go directly to the Cancer Center and check in at the registration area.  Wear comfortable clothing and clothing appropriate for easy access to any Portacath or PICC line.   We strive to give you quality time with your provider. You may need to reschedule your appointment if you arrive late (15 or more minutes).  Arriving late affects you and other patients whose appointments are after yours.  Also, if you miss three or more appointments without notifying the office, you may be dismissed from the clinic at the provider's discretion.      For prescription refill requests, have your pharmacy contact our office and allow 72 hours for refills to be completed.    Today you received the following chemotherapy and/or immunotherapy agents- Kyprolis      To help prevent nausea and vomiting after your treatment, we encourage you to take your nausea medication as directed.  BELOW ARE SYMPTOMS THAT SHOULD BE REPORTED IMMEDIATELY: *FEVER GREATER THAN 100.4 F (38 C) OR HIGHER *CHILLS OR SWEATING *NAUSEA AND VOMITING THAT IS NOT CONTROLLED WITH YOUR NAUSEA MEDICATION *UNUSUAL SHORTNESS OF BREATH *UNUSUAL BRUISING OR BLEEDING *URINARY PROBLEMS (pain or burning when urinating, or frequent urination) *BOWEL PROBLEMS (unusual diarrhea, constipation, pain near the anus) TENDERNESS IN MOUTH AND THROAT WITH OR WITHOUT PRESENCE OF ULCERS (sore throat, sores in mouth, or a toothache) UNUSUAL RASH, SWELLING OR PAIN  UNUSUAL VAGINAL DISCHARGE OR ITCHING   Items with * indicate a potential emergency and should be followed up as soon as possible or go to the Emergency Department if any problems should occur.  Please show the CHEMOTHERAPY ALERT CARD or IMMUNOTHERAPY  ALERT CARD at check-in to the Emergency Department and triage nurse.  Should you have questions after your visit or need to cancel or reschedule your appointment, please contact [facility] - [facility] DEPT OF [facility]  [phone] and follow the prompts.  Office hours are 8:00 a.m. to 4:30 p.m. Monday - Friday. Please note that voicemails left after 4:00 p.m. may not be returned until the following business day.  We are closed weekends and major holidays. You have access to a nurse at all times for urgent questions. Please call the main number to the clinic [phone] and follow the prompts.  For any non-urgent questions, you may also contact your provider using MyChart. We now offer e-Visits for anyone 66 and older to request care online for non-urgent symptoms. For details visit [email or web address].   Also download the MyChart app! Go to the app store, search "MyChart", open the app, select , and log in with your MyChart username and password.

## 2023-03-05 ENCOUNTER — Other Ambulatory Visit: Payer: Self-pay

## 2023-03-05 NOTE — Progress Notes (Signed)
Cardiology Clinic Note   Date: 03/07/2023 ID: [name], DOB 06/08/1970, MRN [id number]  Primary Cardiologist:  Debbe Odea, MD  Patient Profile    James House. is a 52 y.o. male who presents to the clinic today for follow up after medication changes.     Past medical history significant for: Hypertension. CKD stage III. Multiple myeloma. Currently on chemo. Echo 10/04/2022: EF 55 to 60%.  No RWMA.  Grade I DD.  Normal RV size/function.  Normal PA pressure.  Mild MR.     History of Present Illness    Liron Heineman. is a new patient of Dr. Azucena Cecil.  Patient was first seen in the office on 02/06/2023 for evaluation of shortness of breath and increasing BP with no cardiac history at the request of Dr. Chestine Spore.  PCP increased amlodipine and benazepril secondary to elevated BP.  He recently started new chemo medication with noted side effect of hypertension and increased heart rate.  He does report elevated heart rate and occasional palpitations with typical HR in the high 90s to low 100s.  Patient was started on carvedilol.   Discussed the use of AI scribe software for clinical note transcription with the patient, who gave verbal consent to proceed.  The patient presents for follow-up after medication changes. He reports feeling 'tired as usual.' His BP has continued to be elevated with home readings as high as 158-160/101-120. He uses a wrist cuff. We discussed technique for taking his BP and bringing the cuff to his next doctor visit to correlate readings. He understands that wrist cuffs tend to be inaccurate and if his readings are off he may need to consider an upper arm cuff. He reports experiencing symptoms of mild dizziness and a flushed face when BP is high. His initial BP on intake is 144/84 and 136/80 on my recheck. He reports feeling symptomatic but also shares that dizziness and flushing are a side effect of the chemo as well.  He reports dyspnea with heavier  exertion particularly climbing stairs that resolves with rest and sometimes turning on his bedroom fan. He has started ambulating with a cane for stability and has had near falls but has been able to catch himself. His heart rate has also continued to be elevated. He did not bring his log but at his last couple of doctor's visits his HR was in the 90s. It is 102 today.        ROS: All other systems reviewed and are otherwise negative except as noted in History of Present Illness.  Studies Reviewed    EKG Interpretation Date/Time:  Friday March 07 2023 10:44:22 EST Ventricular Rate:  102 PR Interval:  124 QRS Duration:  92 QT Interval:  332 QTC Calculation: 432 R Axis:   16  Text Interpretation: Sinus tachycardia Nonspecific ST abnormality When compared with ECG of 06-Feb-2023 08:43, No significant change was found Confirmed by Carlos Levering [phone]) on 03/07/2023 10:54:26 AM           Physical Exam    VS:  BP (!) 144/84 (BP Location: Left Leg, Cuff Size: Normal)   Pulse (!) 102   Ht 5\' 7"  (1.702 m)   Wt 176 lb 4 oz (79.9 kg)   SpO2 97%   BMI 27.60 kg/m  , BMI Body mass index is 27.6 kg/m.  GEN: Well nourished, well developed, in no acute distress. Neck: No JVD or carotid bruits. Cardiac:  RRR. No murmurs. No  rubs or gallops.   Respiratory:  Respirations regular and unlabored. Clear to auscultation without rales, wheezing or rhonchi. GI: Soft, nontender, nondistended. Extremities: Radials/DP/PT 2+ and equal bilaterally. No clubbing or cyanosis. No edema.  Skin: Warm and dry, no rash. Neuro: Strength intact.  Assessment & Plan   Assessment and Plan    Hypertension Recent elevation in BP secondary to chemo.  BP today 144/84 on intake and 136/80 on my recheck. Home BP readings are reported as consistently >140/80 with readings as high as 158-160/101-120. He is using a wrist cuff. Corrected patient in use to proper technique. He reports feeling dizzy and flushed in  the face when BP is elevated.  -Increase Carvedilol to 12.5mg  twice daily. -Continue amlodipine-benazepril.  -Advise patient to check blood pressure consistently about two hours after taking medication. -Advise patient to bring home blood pressure cuff to next doctor's appointment for accuracy comparison. If readings are greatly inaccurate he understands he may need to get an upper arm cuff.   Risk of Falls Patient has a history of falls and is now using a cane to prevent further incidents. No recent falls reported. -Continue use of cane to prevent falls.        Disposition: Increase carvedilol to 12.5 mg bid. Return in 1 month or sooner as needed.          Signed, Etta Grandchild. Eryca Bolte, DNP, NP-C

## 2023-03-06 ENCOUNTER — Encounter: Payer: Self-pay | Admitting: Oncology

## 2023-03-06 ENCOUNTER — Encounter: Payer: Self-pay | Admitting: Adult Health

## 2023-03-06 ENCOUNTER — Other Ambulatory Visit: Payer: Self-pay

## 2023-03-06 ENCOUNTER — Ambulatory Visit: Payer: Medicare HMO | Admitting: Adult Health

## 2023-03-06 VITALS — BP 130/86 | HR 108 | Temp 97.8°F | Ht 67.0 in | Wt 176.0 lb

## 2023-03-06 DIAGNOSIS — R400 Somnolence: Secondary | ICD-10-CM | POA: Diagnosis not present

## 2023-03-06 DIAGNOSIS — R0683 Snoring: Secondary | ICD-10-CM

## 2023-03-06 NOTE — Assessment & Plan Note (Signed)
Mild snoring, restless sleep, daytime sleepiness all concerning for underlying sleep apnea.  Patient education given on sleep apnea. Will set patient up for home sleep study.  Ideally would be better to have an in lab study as he is on multiple sedating medications.  However with his current schedule/treatments for multiple myeloma will set up for home sleep study.  - discussed how weight can impact sleep and risk for sleep disordered breathing - discussed options to assist with weight loss: combination of diet modification, cardiovascular and strength training exercises   - had an extensive discussion regarding the adverse health consequences related to untreated sleep disordered breathing - specifically discussed the risks for hypertension, coronary artery disease, cardiac dysrhythmias, cerebrovascular disease, and diabetes - lifestyle modification discussed   - discussed how sleep disruption can increase risk of accidents, particularly when driving - safe driving practices were discussed   Plan  Patient Instructions  Home sleep study Do not drive if sleepy Work on healthy weight Follow-up in 6 weeks to discuss sleep study results can be in person or virtual

## 2023-03-06 NOTE — Patient Instructions (Signed)
Home sleep study Do not drive if sleepy Work on healthy weight Follow-up in 6 weeks to discuss sleep study results can be in person or virtual

## 2023-03-06 NOTE — Assessment & Plan Note (Signed)
Daytime sleepiness suspect is multifactorial.  He does have risk factors for sleep apnea.  Will set up for home sleep study.  He is also on multiple sedating medications including chronic narcotic pain medicines and Lyrica and muscle relaxers, Elavil, Zyprexa, Cymbalta, Lyrica which could be contributing due to his daytime sleepiness  Plan  Patient Instructions  Home sleep study Do not drive if sleepy Work on healthy weight Follow-up in 6 weeks to discuss sleep study results can be in person or virtual

## 2023-03-06 NOTE — Progress Notes (Signed)
@Patient  ID: James House., male    DOB: 09-14-1970, 52 y.o.   MRN: [id number]  Chief Complaint  Patient presents with   Consult    Referring provider: Doreene Nest, NP  HPI: 52 year old male seen for sleep consult March 06, 2023 for snoring, restless sleep, daytime sleepiness. Medical history significant for multiple myeloma followed by oncology-[facility] Dr Pandora Leiter and [facility] Dr. Smith Robert  (Dx 2018 , s/p stem cell transplant 2018 , Rx Revlimid, Recurrence 03/2022-Darazalex  , currently on Carfilzomib  Has chronic back pain and peripheral neuropathy -On chronic narcotics, Lyrica and muscle relaxers   TEST/EVENTS :   03/06/2023 Sleep consult  Patient presents for sleep consult today.  Patient snoring, restless sleep, daytime sleepiness.  Says he feels tired when he wakes up in the morning.  No matter how much sleep he gets he always feels like he has low energy.  Patient does not nap.  Drinks about 3 cups of caffeine daily.  Has no removable dental work.  No history of congestive heart failure or stroke.  Does have chronic insomnia and takes Elavil at bedtime.  Patient says he has trouble going to sleep and staying asleep.  Feels that his sleep is fragmented and restless.  Typically goes to sleep about 9 PM.  Gets up at 8 AM.  Weight is up 30 pounds over the last 2 years.  Current weight is at 176 pounds with a BMI 27.  Epworth score is 17.  Typically gets sleepy if he sits down to rest, watch TV or in the evening hours.  frequently gasp or air at night.  No symptoms suspicious for cataplexy or sleep paralysis. Patient has an extensive medical history with multiple myeloma diagnosed in 2018 currently undergoing active therapy.  He has chronic pain in his back along with chronic neuropathy.  He is on narcotics, Lyrica and muscle relaxers.  He denies any symptoms suspicious for cataplexy or sleep paralysis.  No teeth grinding her teeth clenching.        03/06/2023   11:27 AM  Results of  the Epworth flowsheet  Sitting and reading 3  Watching TV 3  Sitting, inactive in a public place (e.g. a theatre or a meeting) 3  As a passenger in a car for an hour without a break 2  Lying down to rest in the afternoon when circumstances permit 2  Sitting and talking to someone 1  Sitting quietly after a lunch without alcohol 3  In a car, while stopped for a few minutes in traffic 0  Total score 17   Social history patient is single.  Lives at home with his daughter and grandson.  He is a former smoker.  Only smoked for about 2 years.  Does use smokeless tobacco.  No alcohol or drug use.  He is currently on disability.  With his chronic medical problems he uses a cane.  Is somewhat sedentary.  Family history positive for emphysema, heart disease, cancer.  Past Surgical History:  Procedure Laterality Date   ABDOMINAL SURGERY     COLONOSCOPY WITH PROPOFOL N/A 05/25/2021   Procedure: COLONOSCOPY WITH PROPOFOL;  Surgeon: Toney Reil, MD;  Location: [facility] ENDOSCOPY;  Service: Gastroenterology;  Laterality: N/A;   IR IMAGING GUIDED PORT INSERTION  08/02/2022   JOINT REPLACEMENT     right knee   KNEE SURGERY Left      No Known Allergies  Immunization History  Administered Date(s) Administered   Influenza,inj,Quad PF,6+  Mos 01/19/2016, 02/13/2018   Pneumococcal Conjugate-13 02/13/2018, 04/16/2018, 06/18/2018   Tdap 02/13/2018, 04/16/2018, 06/18/2018, 04/09/2020   Zoster Recombinant(Shingrix) 11/21/2021    Past Medical History:  Diagnosis Date   CKD (chronic kidney disease) stage 3, GFR 30-59 ml/min (HCC)    Elevated sedimentation rate 12/24/2017   Hypertension    Multiple myeloma (HCC) 08/11/2017   Neuropathy    Pneumonia    Severe sepsis (HCC) 08/11/2017   Shingles 08/03/2019    Tobacco History: Social History   Tobacco Use  Smoking Status Former  Smokeless Tobacco Current   Types: Snuff   Ready to quit: Not Answered Counseling given: Not  Answered   Outpatient Medications Prior to Visit  Medication Sig Dispense Refill   acyclovir (ZOVIRAX) 400 MG tablet Take 1 tablet (400 mg total) by mouth 2 (two) times daily. 60 tablet 2   albuterol (VENTOLIN HFA) 108 (90 Base) MCG/ACT inhaler Inhale 2 puffs into the lungs every 6 (six) hours as needed for wheezing or shortness of breath. 6.7 g 2   amitriptyline (ELAVIL) 25 MG tablet Take 1 tablet (25 mg total) by mouth at bedtime. For headache prevention and sleep 90 tablet 0   amLODipine-benazepril (LOTREL) 10-40 MG capsule Take 1 capsule by mouth daily. for blood pressure. 90 capsule 0   aspirin EC 81 MG tablet Take by mouth.     baclofen (LIORESAL) 10 MG tablet Take 1 tablet (10 mg total) by mouth 4 (four) times daily as needed for muscle spasms. 360 tablet 0   carvedilol (COREG) 6.25 MG tablet Take 1 tablet (6.25 mg total) by mouth 2 (two) times daily. 180 tablet 3   celecoxib (CELEBREX) 200 MG capsule Take 1 capsule (200 mg total) by mouth daily. 30 capsule 2   cyanocobalamin (VITAMIN B12) 1000 MCG/ML injection Inject 1 mL (1,000 mcg total) into the muscle every 30 (thirty) days. 1 mL 11   DULoxetine (CYMBALTA) 60 MG capsule Take 1 capsule (60 mg total) by mouth daily. 60 capsule 1   levothyroxine (SYNTHROID) 50 MCG tablet Take 1 tablet (50 mcg total) by mouth daily before breakfast. 30 tablet 2   lidocaine-prilocaine (EMLA) cream Apply 1 Application topically as needed. 30 g 0   montelukast (SINGULAIR) 10 MG tablet Take 1 tablet in the evening 1 day before your treatment. Day of treatment take 1 tablet in the morning for the first 2 treatments of Daratumumab 4 tablet 0   OLANZapine (ZYPREXA) 10 MG tablet Take 1 tablet (10 mg total) by mouth at bedtime. To help with nausea prevention. 30 tablet 1   oxyCODONE ER (XTAMPZA ER) 9 MG C12A Take 9 mg by mouth every 12 (twelve) hours. 60 capsule 0   Oxycodone HCl 10 MG TABS Take 1 tablet (10 mg total) by mouth every 8 (eight) hours as needed. 90  tablet 0   pomalidomide (POMALYST) 2 MG capsule Take 1 capsule (2 mg total) by mouth daily. AND 7 DAYS OFF.  CELGENE-REM : [id number]   DONE ON 01/17/2023 21 capsule 0   potassium chloride SA (KLOR-CON M) 20 MEQ tablet Take 2 tablets (40 mEq total) by mouth 2 (two) times daily. 120 tablet 0   prochlorperazine (COMPAZINE) 10 MG tablet Take by mouth.     pregabalin (LYRICA) 300 MG capsule Take 1 capsule (300 mg total) by mouth 2 (two) times daily. 60 capsule 3   amoxicillin-clavulanate (AUGMENTIN) 875-125 MG tablet Take 1 tablet by mouth 2 (two) times daily. (Patient not taking: Reported on  11/28/2022) 14 tablet 0   OLANZapine (ZYPREXA) 10 MG tablet Take by mouth. (Patient not taking: Reported on 12/25/2022)     No facility-administered medications prior to visit.     Review of Systems:   Constitutional:   No  weight loss, night sweats,  Fevers, chills, +fatigue, or  lassitude.  HEENT:   No headaches,  Difficulty swallowing,  Tooth/dental problems, or  Sore throat,                No sneezing, itching, ear ache, nasal congestion, post nasal drip,   CV:  No chest pain,  Orthopnea, PND, swelling in lower extremities, anasarca, dizziness, palpitations, syncope.   GI  No heartburn, indigestion, abdominal pain, nausea, vomiting, diarrhea, change in bowel habits, loss of appetite, bloody stools.   Resp: No shortness of breath with exertion or at rest.  No excess mucus, no productive cough,  No non-productive cough,  No coughing up of blood.  No change in color of mucus.  No wheezing.  No chest wall deformity  Skin: no rash or lesions.  GU: no dysuria, change in color of urine, no urgency or frequency.  No flank pain, no hematuria   MS: Positive back pain  Physical Exam  BP 130/86 (BP Location: Left Arm, Patient Position: Sitting, Cuff Size: Normal)   Pulse (!) 108   Temp 97.8 F (36.6 C) (Temporal)   Ht 5\' 7"  (1.702 m)   Wt 176 lb (79.8 kg)   SpO2 100%   BMI 27.57 kg/m   GEN: A/Ox3;  pleasant , NAD, well nourished, cane   HEENT:  [place]/AT,   NOSE-clear, THROAT-clear, no lesions, no postnasal drip or exudate noted.  Class III-IV NP airway  NECK:  Supple w/ fair ROM; no JVD; normal carotid impulses w/o bruits; no thyromegaly or nodules palpated; no lymphadenopathy.    RESP  Clear  P & A; w/o, wheezes/ rales/ or rhonchi. no accessory muscle use, no dullness to percussion  CARD:  RRR, no m/r/g, no peripheral edema, pulses intact, no cyanosis or clubbing.  GI:   Soft & nt; nml bowel sounds; no organomegaly or masses detected.   Musco: Warm bil, no deformities or joint swelling noted.   Neuro: alert, no focal deficits noted.    Skin: Warm, no lesions or rashes    Lab Results:      BNP   ProBNP No results found for: "PROBNP"  Imaging: No results found.  carfilzomib (KYPROLIS) 36 mg in dextrose 5 % 50 mL chemo infusion     Date Action Dose Route User   01/07/2023 1100 Rate/Dose Change (none) Intravenous Jannifer Rodney, RN   01/07/2023 1059 Rate/Dose Change (none) Intravenous Jannifer Rodney, RN   01/07/2023 1024 Infusion Verify (none) Intravenous Jannifer Rodney, RN   01/07/2023 1024 New Bag/Given 36 mg Intravenous Jannifer Rodney, RN      carfilzomib (KYPROLIS) 36 mg in dextrose 5 % 50 mL chemo infusion     Date Action Dose Route User   01/21/2023 1028 Infusion Verify (none) Intravenous Delena Bali, RN   01/21/2023 1028 Rate/Dose Change (none) Intravenous Delena Bali, RN   01/21/2023 1027 New Bag/Given 36 mg Intravenous Delena Bali, RN      carfilzomib (KYPROLIS) 36 mg in dextrose 5 % 50 mL chemo infusion     Date Action Dose Route User   01/28/2023 1209 Rate/Dose Change (none) Intravenous Rennie Plowman, RN   01/28/2023 1209 Rate/Dose Change (none) Intravenous  Rennie Plowman, RN   01/28/2023 1134 Infusion Verify (none) Intravenous Rennie Plowman, RN   01/28/2023 1133 New Bag/Given 36 mg Intravenous Rennie Plowman, RN      carfilzomib (KYPROLIS)  36 mg in dextrose 5 % 50 mL chemo infusion     Date Action Dose Route User   02/04/2023 1117 Rate/Dose Change (none) Intravenous Leodis Sias, RN   02/04/2023 1117 Rate/Dose Change (none) Intravenous Leodis Sias, RN   02/04/2023 1039 Infusion Verify (none) Intravenous Leodis Sias, RN   02/04/2023 1039 New Bag/Given 36 mg Intravenous Leodis Sias, RN      carfilzomib (KYPROLIS) 36 mg in dextrose 5 % 50 mL chemo infusion     Date Action Dose Route User   02/18/2023 1105 Infusion Verify (none) Intravenous Tomi Likens, RN   02/18/2023 1105 New Bag/Given 36 mg Intravenous Tomi Likens, RN      carfilzomib (KYPROLIS) 36 mg in dextrose 5 % 50 mL chemo infusion     Date Action Dose Route User   02/25/2023 1123 Rate/Dose Change (none) Intravenous Robbie Lis, RN   02/25/2023 1123 Rate/Dose Change (none) Intravenous Robbie Lis, RN   02/25/2023 1053 Rate/Dose Change (none) Intravenous Robbie Lis, RN   02/25/2023 1052 New Bag/Given 36 mg Intravenous Robbie Lis, RN      carfilzomib (KYPROLIS) 36 mg in dextrose 5 % 50 mL chemo infusion     Date Action Dose Route User   03/04/2023 1053 Rate/Dose Change (none) Intravenous Robbie Lis, RN   03/04/2023 1053 Rate/Dose Change (none) Intravenous Robbie Lis, RN   03/04/2023 1023 Rate/Dose Change (none) Intravenous Robbie Lis, RN   03/04/2023 1023 New Bag/Given 36 mg Intravenous Robbie Lis, RN      dexamethasone (DECADRON) 10 mg in sodium chloride 0.9 % 50 mL IVPB     Date Action Dose Route User   01/07/2023 0939 Infusion Verify (none) Intravenous Jannifer Rodney, RN   01/07/2023 3086 Infusion Verify (none) Intravenous Jannifer Rodney, RN   01/07/2023 5784 New Bag/Given 10 mg Intravenous Jannifer Rodney, RN      dexamethasone (DECADRON) 10 mg in sodium chloride 0.9 % 50 mL IVPB     Date Action Dose Route User   01/21/2023 0916 Rate/Dose Change (none)  Intravenous Delena Bali, RN   01/21/2023 6962 New Bag/Given 10 mg Intravenous Delena Bali, RN      dexamethasone (DECADRON) 10 mg in sodium chloride 0.9 % 50 mL IVPB     Date Action Dose Route User   01/28/2023 1024 Rate/Dose Change (none) Intravenous Rennie Plowman, RN   01/28/2023 1024 New Bag/Given 10 mg Intravenous Delena Bali, RN      dexamethasone (DECADRON) 10 mg in sodium chloride 0.9 % 50 mL IVPB     Date Action Dose Route User   02/04/2023 0932 Infusion Verify (none) Intravenous Leodis Sias, RN   02/04/2023 0932 New Bag/Given 10 mg Intravenous Leodis Sias, RN      dexamethasone (DECADRON) 20 mg in sodium chloride 0.9 % 50 mL IVPB     Date Action Dose Route User   02/18/2023 0934 Rate/Dose Change (none) Intravenous Tomi Likens, RN   02/18/2023 9528 New Bag/Given 20 mg Intravenous Tomi Likens, RN      dexamethasone (DECADRON) 20 mg in sodium chloride 0.9 % 50 mL IVPB     Date Action Dose Route User   02/25/2023 0929 Rate/Dose Change (none) Intravenous Sung Amabile  D, RN   02/25/2023 0914 New Bag/Given 20 mg Intravenous Robbie Lis, RN      dexamethasone (DECADRON) injection 10 mg     Date Action Dose Route User   03/04/2023 0934 Given 10 mg Intravenous Robbie Lis, RN      heparin lock flush 100 unit/mL     Date Action Dose Route User   01/07/2023 1105 Given 500 Units Intracatheter [place], [place] L, RN      heparin lock flush 100 unit/mL     Date Action Dose Route User   01/21/2023 1105 Given 500 Units Intracatheter [place], [place] H, RN      heparin lock flush 100 unit/mL     Date Action Dose Route User   01/28/2023 1215 Given 500 Units Intracatheter Rennie Plowman, RN      heparin lock flush 100 unit/mL     Date Action Dose Route User   02/04/2023 1127 Given 500 Units Intracatheter Leodis Sias, RN      heparin lock flush 100 unit/mL     Date Action Dose Route User   02/18/2023 1146 Given 500 Units  Intracatheter [place], [place] H, RN      heparin lock flush 100 unit/mL     Date Action Dose Route User   02/25/2023 1136 Given 500 Units Intracatheter Sung Amabile D, RN      heparin lock flush 100 unit/mL     Date Action Dose Route User   03/04/2023 1059 Given 500 Units Intracatheter Sung Amabile D, RN      0.9 %  sodium chloride infusion     Date Action Dose Route User   01/07/2023 1102 Rate/Dose Change (none) Intravenous Jannifer Rodney, RN   01/07/2023 1023 Infusion Verify (none) Intravenous Jannifer Rodney, RN   01/07/2023 7425 Restarted (none) Intravenous Jannifer Rodney, RN   01/07/2023 9563 New Bag/Given (none) Intravenous Bernita Raisin L, RN      0.9 %  sodium chloride infusion     Date Action Dose Route User   01/07/2023 0953 Rate/Dose Change (none) Intravenous Jannifer Rodney, RN   01/07/2023 8756 New Bag/Given (none) Intravenous Bernita Raisin L, RN      0.9 %  sodium chloride infusion     Date Action Dose Route User   01/21/2023 1100 Rate/Dose Change (none) Intravenous Delena Bali, RN   01/21/2023 1002 Infusion Verify (none) Intravenous Delena Bali, RN   01/21/2023 1002 New Bag/Given (none) Intravenous Jeannetta Nap, Amy H, RN      0.9 %  sodium chloride infusion     Date Action Dose Route User   01/21/2023 1001 Rate/Dose Change (none) Intravenous Delena Bali, RN   01/21/2023 4332 Restarted (none) Intravenous Delena Bali, RN   01/21/2023 0915 New Bag/Given (none) Intravenous Delena Bali, RN      0.9 %  sodium chloride infusion     Date Action Dose Route User   01/28/2023 1216 Restarted (none) Intravenous Rennie Plowman, RN   01/28/2023 1043 Rate/Dose Change (none) Intravenous Rennie Plowman, RN   01/28/2023 1043 Infusion Verify (none) Intravenous Rennie Plowman, RN   01/28/2023 1043 Rate/Dose Change (none) Intravenous Rennie Plowman, RN   01/28/2023 1039 Rate/Dose Change (none) Intravenous Rennie Plowman, RN      0.9 %  sodium chloride infusion      Date Action Dose Route User   01/28/2023 1111 Rate/Dose Change (none) Intravenous Rennie Plowman, RN   01/28/2023 1111 Rate/Dose Change (none) Intravenous Rennie Plowman,  RN   01/28/2023 1041 New Bag/Given (none) Intravenous Rennie Plowman, RN      0.9 %  sodium chloride infusion     Date Action Dose Route User   02/04/2023 1126 Rate/Dose Change (none) Intravenous Leodis Sias, RN   02/04/2023 1121 Infusion Verify (none) Intravenous Leodis Sias, RN   02/04/2023 1121 Rate/Dose Change (none) Intravenous Leodis Sias, RN   02/04/2023 1020 Infusion Verify (none) Intravenous Leodis Sias, RN   02/04/2023 1020 Rate/Dose Change (none) Intravenous Leodis Sias, RN      0.9 %  sodium chloride infusion     Date Action Dose Route User   02/18/2023 1142 Infusion Verify (none) Intravenous Tomi Likens, RN   02/18/2023 1140 Rate/Dose Change (none) Intravenous Tomi Likens, RN   02/18/2023 1047 Rate/Dose Change (none) Intravenous Tomi Likens, RN   02/18/2023 7829 Restarted (none) Intravenous Tomi Likens, RN   02/18/2023 5621 New Bag/Given (none) Intravenous Jerene Pitch H, RN      0.9 %  sodium chloride infusion     Date Action Dose Route User   02/25/2023 0914 Rate/Dose Change (none) Intravenous Robbie Lis, RN   02/25/2023 0914 New Bag/Given (none) Intravenous Robbie Lis, RN      0.9 %  sodium chloride infusion     Date Action Dose Route User   02/25/2023 1132 Infusion Verify (none) Intravenous Robbie Lis, RN   02/25/2023 1131 Rate/Dose Change (none) Intravenous Robbie Lis, RN   02/25/2023 1128 Rate/Dose Change (none) Intravenous Robbie Lis, RN   02/25/2023 1101 Infusion Verify (none) Intravenous Robbie Lis, RN   02/25/2023 1101 Infusion Verify (none) Intravenous Robbie Lis, RN      0.9 %  sodium chloride infusion     Date Action Dose Route User   03/04/2023 0940 Rate/Dose Change (none)  Intravenous Robbie Lis, RN   03/04/2023 0936 Rate/Dose Change (none) Intravenous Robbie Lis, RN   03/04/2023 0929 Rate/Dose Change (none) Intravenous Robbie Lis, RN   03/04/[phone] New Bag/Given (none) Intravenous Robbie Lis, RN      0.9 %  sodium chloride infusion     Date Action Dose Route User   03/04/2023 1101 Rate/Dose Change (none) Intravenous Robbie Lis, RN   03/04/2023 1058 Rate/Dose Change (none) Intravenous Robbie Lis, RN   03/04/2023 1012 Rate/Dose Change (none) Intravenous Robbie Lis, RN   03/04/2023 1011 Rate/Dose Change (none) Intravenous Robbie Lis, RN   03/04/2023 [phone] New Bag/Given (none) Intravenous Robbie Lis, RN      sodium chloride flush (NS) 0.9 % injection 10 mL     Date Action Dose Route User   02/04/2023 1127 Given 10 mL Intracatheter Leodis Sias, RN      sodium chloride flush (NS) 0.9 % injection 10 mL     Date Action Dose Route User   02/25/2023 1136 Given 10 mL Intracatheter Robbie Lis, RN      sodium chloride flush (NS) 0.9 % injection 10 mL     Date Action Dose Route User   03/04/2023 1059 Given 10 mL Intracatheter Robbie Lis, RN           No data to display          No results found for: "NITRICOXIDE"      Assessment & Plan:   Snoring Mild snoring, restless sleep, daytime sleepiness all concerning for underlying sleep apnea.  Patient education given on sleep  apnea. Will set patient up for home sleep study.  Ideally would be better to have an in lab study as he is on multiple sedating medications.  However with his current schedule/treatments for multiple myeloma will set up for home sleep study.  - discussed how weight can impact sleep and risk for sleep disordered breathing - discussed options to assist with weight loss: combination of diet modification, cardiovascular and strength training exercises   - had an extensive discussion regarding  the adverse health consequences related to untreated sleep disordered breathing - specifically discussed the risks for hypertension, coronary artery disease, cardiac dysrhythmias, cerebrovascular disease, and diabetes - lifestyle modification discussed   - discussed how sleep disruption can increase risk of accidents, particularly when driving - safe driving practices were discussed   Plan  Patient Instructions  Home sleep study Do not drive if sleepy Work on healthy weight Follow-up in 6 weeks to discuss sleep study results can be in person or virtual    Daytime sleepiness Daytime sleepiness suspect is multifactorial.  He does have risk factors for sleep apnea.  Will set up for home sleep study.  He is also on multiple sedating medications including chronic narcotic pain medicines and Lyrica and muscle relaxers, Elavil, Zyprexa, Cymbalta, Lyrica which could be contributing due to his daytime sleepiness  Plan  Patient Instructions  Home sleep study Do not drive if sleepy Work on healthy weight Follow-up in 6 weeks to discuss sleep study results can be in person or virtual      Rubye Oaks, NP 03/06/2023

## 2023-03-07 ENCOUNTER — Encounter: Payer: Self-pay | Admitting: Student

## 2023-03-07 ENCOUNTER — Other Ambulatory Visit: Payer: Self-pay

## 2023-03-07 ENCOUNTER — Other Ambulatory Visit: Payer: Self-pay | Admitting: *Deleted

## 2023-03-07 ENCOUNTER — Ambulatory Visit: Payer: BC Managed Care – PPO | Attending: Student | Admitting: Student

## 2023-03-07 VITALS — BP 136/80 | HR 102 | Ht 67.0 in | Wt 176.2 lb

## 2023-03-07 DIAGNOSIS — I10 Essential (primary) hypertension: Secondary | ICD-10-CM | POA: Diagnosis not present

## 2023-03-07 DIAGNOSIS — Z9181 History of falling: Secondary | ICD-10-CM | POA: Diagnosis not present

## 2023-03-07 MED ORDER — OXYCODONE HCL 15 MG PO TABS
15.0000 mg | ORAL_TABLET | Freq: Three times a day (TID) | ORAL | 0 refills | Status: DC | PRN
  Filled 2023-03-07: qty 90, 30d supply, fill #0

## 2023-03-07 MED ORDER — CARVEDILOL 12.5 MG PO TABS
12.5000 mg | ORAL_TABLET | Freq: Two times a day (BID) | ORAL | 1 refills | Status: DC
  Filled 2023-03-07: qty 180, 90d supply, fill #0

## 2023-03-07 NOTE — Patient Instructions (Signed)
Medication Instructions:  INCREASE the Carvedilol to 12.5 mg twice daily  *If you need a refill on your cardiac medications before your next appointment, please call your pharmacy*   Lab Work: None ordered If you have labs (blood work) drawn today and your tests are completely normal, you will receive your results only by: MyChart Message (if you have MyChart) OR A paper copy in the mail If you have any lab test that is abnormal or we need to change your treatment, we will call you to review the results.   Testing/Procedures: None ordered   Follow-Up: At [facility], you and your health needs are our priority.  As part of our continuing mission to provide you with exceptional heart care, we have created designated Provider Care Teams.  These Care Teams include your primary Cardiologist (physician) and Advanced Practice Providers (APPs -  Physician Assistants and Nurse Practitioners) who all work together to provide you with the care you need, when you need it.  We recommend signing up for the patient portal called "MyChart".  Sign up information is provided on this After Visit Summary.  MyChart is used to connect with patients for Virtual Visits (Telemedicine).  Patients are able to view lab/test results, encounter notes, upcoming appointments, etc.  Non-urgent messages can be sent to your provider as well.   To learn more about what you can do with MyChart, go to [email or web address].    Your next appointment:   1 month(s)  Provider:   You may see Debbe Odea, MD or one of the following Advanced Practice Providers on your designated Care Team:   Carlos Levering, NP

## 2023-03-10 ENCOUNTER — Other Ambulatory Visit: Payer: Self-pay

## 2023-03-11 DIAGNOSIS — C9002 Multiple myeloma in relapse: Secondary | ICD-10-CM | POA: Diagnosis not present

## 2023-03-13 ENCOUNTER — Telehealth: Payer: Self-pay | Admitting: *Deleted

## 2023-03-13 NOTE — Telephone Encounter (Signed)
I had got a email to see if we can send in meds for him of amlodipine, and .benazepril. I called the [facility] and spoke to Tiffany and that we do not give him the above meds. He has NP Vernona Rieger and [facility] can get In touch for those meds.

## 2023-03-16 DIAGNOSIS — R0683 Snoring: Secondary | ICD-10-CM

## 2023-03-16 DIAGNOSIS — G4730 Sleep apnea, unspecified: Secondary | ICD-10-CM | POA: Diagnosis not present

## 2023-03-16 DIAGNOSIS — R400 Somnolence: Secondary | ICD-10-CM

## 2023-03-17 ENCOUNTER — Telehealth: Payer: Self-pay | Admitting: Primary Care

## 2023-03-17 DIAGNOSIS — M62830 Muscle spasm of back: Secondary | ICD-10-CM

## 2023-03-17 DIAGNOSIS — M7918 Myalgia, other site: Secondary | ICD-10-CM

## 2023-03-17 DIAGNOSIS — I10 Essential (primary) hypertension: Secondary | ICD-10-CM

## 2023-03-17 DIAGNOSIS — R519 Headache, unspecified: Secondary | ICD-10-CM

## 2023-03-17 MED ORDER — BACLOFEN 10 MG PO TABS: 10.0000 mg | ORAL_TABLET | Freq: Four times a day (QID) | ORAL | 1 refills | Status: DC | PRN

## 2023-03-17 MED ORDER — AMITRIPTYLINE HCL 25 MG PO TABS: 25.0000 mg | ORAL_TABLET | Freq: Every day | ORAL | 1 refills | Status: DC

## 2023-03-17 MED ORDER — AMLODIPINE BESY-BENAZEPRIL HCL 10-40 MG PO CAPS: 1.0000 | ORAL_CAPSULE | Freq: Every day | ORAL | 1 refills | Status: DC

## 2023-03-17 NOTE — Telephone Encounter (Signed)
Noted.  Prescriptions sent to preferred pharmacy.

## 2023-03-17 NOTE — Telephone Encounter (Signed)
Prescription Request  03/17/2023  LOV: 12/25/2022  What is the name of the medication or equipment? amitriptyline (ELAVIL) 25 MG tablet, baclofen (LIORESAL) 10 MG tablet & amLODipine-benazepril (LOTREL) 10-40 MG capsule   Have you contacted your pharmacy to request a refill? Yes pharmacy called for pt   Which pharmacy would you like this sent to?  [facility] [place] [address] [place] [address] Phone: [phone] Fax: [phone]    Patient notified that their request is being sent to the clinical staff for review and that they should receive a response within 2 business days.   Please advise at [phone]

## 2023-03-18 ENCOUNTER — Inpatient Hospital Stay: Payer: BC Managed Care – PPO

## 2023-03-18 ENCOUNTER — Other Ambulatory Visit: Payer: Self-pay

## 2023-03-18 ENCOUNTER — Inpatient Hospital Stay (HOSPITAL_BASED_OUTPATIENT_CLINIC_OR_DEPARTMENT_OTHER): Payer: BC Managed Care – PPO | Admitting: Oncology

## 2023-03-18 ENCOUNTER — Encounter: Payer: Self-pay | Admitting: Oncology

## 2023-03-18 VITALS — BP 159/95 | HR 102 | Temp 102.2°F | Resp 18

## 2023-03-18 DIAGNOSIS — Z79899 Other long term (current) drug therapy: Secondary | ICD-10-CM | POA: Diagnosis not present

## 2023-03-18 DIAGNOSIS — C9000 Multiple myeloma not having achieved remission: Secondary | ICD-10-CM

## 2023-03-18 DIAGNOSIS — J00 Acute nasopharyngitis [common cold]: Secondary | ICD-10-CM | POA: Diagnosis not present

## 2023-03-18 DIAGNOSIS — C9002 Multiple myeloma in relapse: Secondary | ICD-10-CM | POA: Diagnosis not present

## 2023-03-18 DIAGNOSIS — G620 Drug-induced polyneuropathy: Secondary | ICD-10-CM

## 2023-03-18 DIAGNOSIS — Z5112 Encounter for antineoplastic immunotherapy: Secondary | ICD-10-CM | POA: Diagnosis not present

## 2023-03-18 LAB — CMP (CANCER CENTER ONLY)
ALT: 23 U/L (ref 0–44)
AST: 32 U/L (ref 15–41)
Albumin: 3.5 g/dL (ref 3.5–5.0)
Alkaline Phosphatase: 82 U/L (ref 38–126)
Anion gap: 10 (ref 5–15)
BUN: 11 mg/dL (ref 6–20)
CO2: 24 mmol/L (ref 22–32)
Calcium: 9.1 mg/dL (ref 8.9–10.3)
Chloride: 105 mmol/L (ref 98–111)
Creatinine: 1.52 mg/dL — ABNORMAL HIGH (ref 0.61–1.24)
GFR, Estimated: 55 mL/min — ABNORMAL LOW (ref >60)
Glucose, Bld: 138 mg/dL — ABNORMAL HIGH (ref 70–99)
Potassium: 3.6 mmol/L (ref 3.5–5.1)
Sodium: 139 mmol/L (ref 135–145)
Total Bilirubin: 0.6 mg/dL (ref <1.2)
Total Protein: 6 g/dL — ABNORMAL LOW (ref 6.5–8.1)

## 2023-03-18 LAB — RESPIRATORY PANEL BY PCR

## 2023-03-18 LAB — CBC WITH DIFFERENTIAL (CANCER CENTER ONLY)
Abs Immature Granulocytes: 0.07 10*3/uL (ref 0.00–0.07)
Basophils Absolute: 0.1 10*3/uL (ref 0.0–0.1)
Basophils Relative: 1 %
Eosinophils Absolute: 0.2 10*3/uL (ref 0.0–0.5)
Eosinophils Relative: 2 %
HCT: 38.4 % — ABNORMAL LOW (ref 39.0–52.0)
Hemoglobin: 13.3 g/dL (ref 13.0–17.0)
Immature Granulocytes: 1 %
Lymphocytes Relative: 5 %
Lymphs Abs: 0.6 10*3/uL — ABNORMAL LOW (ref 0.7–4.0)
MCH: 32.8 pg (ref 26.0–34.0)
MCHC: 34.6 g/dL (ref 30.0–36.0)
MCV: 94.6 fL (ref 80.0–100.0)
Monocytes Absolute: 0.5 10*3/uL (ref 0.1–1.0)
Monocytes Relative: 4 %
Neutro Abs: 11 10*3/uL — ABNORMAL HIGH (ref 1.7–7.7)
Neutrophils Relative %: 87 %
Platelet Count: 260 10*3/uL (ref 150–400)
RBC: 4.06 MIL/uL — ABNORMAL LOW (ref 4.22–5.81)
RDW: 13.4 % (ref 11.5–15.5)
WBC Count: 12.5 10*3/uL — ABNORMAL HIGH (ref 4.0–10.5)
nRBC: 0 % (ref 0.0–0.2)

## 2023-03-18 MED ORDER — HEPARIN SOD (PORK) LOCK FLUSH 100 UNIT/ML IV SOLN
500.0000 [IU] | Freq: Once | INTRAVENOUS | Status: AC
  Administered 2023-03-18: 500 [IU]
  Filled 2023-03-18: qty 5

## 2023-03-18 MED ORDER — SODIUM CHLORIDE 0.9 % IV SOLN
INTRAVENOUS | Status: DC
Start: 2023-03-18 — End: 2023-03-18
  Filled 2023-03-18 (×2): qty 250

## 2023-03-18 MED ORDER — LEVOFLOXACIN 750 MG PO TABS
750.0000 mg | ORAL_TABLET | ORAL | 0 refills | Status: DC
  Filled 2023-03-18: qty 5, 10d supply, fill #0

## 2023-03-18 NOTE — Progress Notes (Addendum)
Hematology/Oncology Consult note [facility]  Telephone:([phone] [phone]  Patient Care Team: James Nest, NP as PCP - General (Internal Medicine) James Majestic, MD as PCP - Hematology/Oncology James Odea, MD as PCP - Cardiology (Cardiology) James Hines, MD as Consulting Physician (Hematology and Oncology)   Name of the patient: James House  [id number]  1970-10-04   Date of visit: 03/18/23  Diagnosis-  kappa light chain multiple myeloma in relapse   Chief complaint/ Reason for visit-acute visit for fatigue and fever  Heme/Onc history: Patient is a 52- year-old male with a history of kappa light chain myeloma that was treated by Dr. Greer House.  History is as follows:   1.  Patient presented with renal insufficiency with a creatinine of 2.4 in April 2018.  Kidney biopsy showed myeloma cast nephropathy.  Kappa light chain was 3004 and lambda 0.22 with a kappa lambda ratio of 13,000 655.  Skeletal survey showed multiple calvarial and marrow lesions.  Bone marrow biopsy in May 2018 showed 43% plasma cells.  He received CyBorD for cycle 1 in May 2018 and was subsequently switched to RVD.  He received 4 cycles followed by a repeat bone marrow biopsy in August 2018 which showed no increase in plasma cells.   2.  He underwent autologous stem cell transplantation on 01/30/2017.  He was started on Revlimid maintenance 10 mg daily in January 2019.   3.  Labs from January February and March 2019 done at [facility] showed no M spike, normal kappa lambda ratio of 1.06.  He was last seen by them in April 2019.   4. Following that patient was admitted to [facility] for healthcare associated pneumonia and was recently discharged.  He wished to transfer his care to [place] at this time.  He was on maintenance Revlimid 5 mg which was then reduced to 2.5 mg.  Follows up with pain clinic for chemo-induced peripheral neuropathy with James House.  His urine tested  positive for [facility] and therefore patient was discharged from pain clinic   5.  Noted to have gradually increasing free kappa light chainFrom 15 in 20 21-39.5 with a ratio of 3.09.  He was seen by [facility] again and underwent a repeat bone marrow biopsy which did not show any evidence of clonal plasma cells.  PET CT scan also did not show any new active lytic lesions in May 2023.Marland Kitchen  Revlimid dose was increased to 15 mg 3 weeks on 1 week off but kappa light chains show continuing to increase   6.  PET CT scan in December 2023 did not show any evidence of active myeloma.  Patient underwent repeat bone marrow biopsy at [facility] which showed 6% plasma cellsConsistent with persistent plasma cell neoplasm.  74 to 86% of isolated plasma cells show loss of T p53 and monosomy 13.  Patient was seen by James House at [facility] and was recommended Darzalex Pomalyst dexamethasone regimen   7.  Disease progression after 8 weekly cycles of Darazalex kappa light chain increased from 332-646 with a ratio that went up from 39-57 concerning for disease progression.  Patient switched to carfilzomib Pomalyst dexamethasone regimen    Interval history-patient states that he woke up feeling fatigued and having more headaches.  He has a temperature of 102 in our office today.  Denies any shortness of breath.  He has mild baseline cough.  Denies any burning urination  ECOG PS- 2 Pain scale- 4   Review of  systems- Review of Systems  Constitutional:  Positive for malaise/fatigue. Negative for chills, fever and weight loss.  HENT:  Negative for congestion, ear discharge and nosebleeds.   Eyes:  Negative for blurred vision.  Respiratory:  Negative for cough, hemoptysis, sputum production, shortness of breath and wheezing.   Cardiovascular:  Negative for chest pain, palpitations, orthopnea and claudication.  Gastrointestinal:  Negative for abdominal pain, blood in stool, constipation, diarrhea, heartburn, melena, nausea and vomiting.   Genitourinary:  Negative for dysuria, flank pain, frequency, hematuria and urgency.  Musculoskeletal:  Negative for back pain, joint pain and myalgias.  Skin:  Negative for rash.  Neurological:  Positive for headaches. Negative for dizziness, tingling, focal weakness, seizures and weakness.  Endo/Heme/Allergies:  Does not bruise/bleed easily.  Psychiatric/Behavioral:  Negative for depression and suicidal ideas. The patient does not have insomnia.       Allergies  Allergen Reactions  . Morphine Nausea And Vomiting     Past Medical History:  Diagnosis Date  . CKD (chronic kidney disease) stage 3, GFR 30-59 ml/min (HCC)   . Elevated sedimentation rate 12/24/2017  . Hypertension   . Multiple myeloma (HCC) 08/11/2017  . Neuropathy   . Pneumonia   . Severe sepsis (HCC) 08/11/2017  . Shingles 08/03/2019     Past Surgical History:  Procedure Laterality Date  . ABDOMINAL SURGERY    . COLONOSCOPY WITH PROPOFOL N/A 05/25/2021   Procedure: COLONOSCOPY WITH PROPOFOL;  Surgeon: Toney Reil, MD;  Location: [facility] ENDOSCOPY;  Service: Gastroenterology;  Laterality: N/A;  . IR IMAGING GUIDED PORT INSERTION  08/02/2022  . JOINT REPLACEMENT     right knee  . KNEE SURGERY Left     Social History   Socioeconomic History  . Marital status: Single    Spouse name: [name]  . Number of children: 3  . Years of education: Not on file  . Highest education level: Not on file  Occupational History  . Not on file  Tobacco Use  . Smoking status: Former  . Smokeless tobacco: Current    Types: Snuff  Vaping Use  . Vaping status: Never Used  Substance and Sexual Activity  . Alcohol use: No  . Drug use: No  . Sexual activity: Yes  Other Topics Concern  . Not on file  Social History Narrative   Live in private residence with spouse and mother in law   Social Determinants of Health   Financial Resource Strain: Low Risk  (08/12/2017)   Overall Financial Resource Strain (CARDIA)   .  Difficulty of Paying Living Expenses: Not hard at all  Food Insecurity: No Food Insecurity (08/12/2017)   Hunger Vital Sign   . Worried About Programme researcher, broadcasting/film/video in the Last Year: Never true   . Ran Out of Food in the Last Year: Never true  Transportation Needs: No Transportation Needs (08/12/2017)   PRAPARE - Transportation   . Lack of Transportation (Medical): No   . Lack of Transportation (Non-Medical): No  Physical Activity: Sufficiently Active (08/12/2017)   Exercise Vital Sign   . Days of Exercise per Week: 7 days   . Minutes of Exercise per Session: 30 min  Stress: No Stress Concern Present (08/12/2017)   [employer] of Occupational Health - Occupational Stress Questionnaire   . Feeling of Stress : Only a little  Social Connections: Somewhat Isolated (08/12/2017)   Social Connection and Isolation Panel [NHANES]   . Frequency of Communication with Friends and Family: Once a week   .  Frequency of Social Gatherings with Friends and Family: Once a week   . Attends Religious Services: More than 4 times per year   . Active Member of Clubs or Organizations: No   . Attends Banker Meetings: Never   . Marital Status: Married  Catering manager Violence: Not At Risk (08/12/2017)   Humiliation, Afraid, Rape, and Kick questionnaire   . Fear of Current or Ex-Partner: No   . Emotionally Abused: No   . Physically Abused: No   . Sexually Abused: No    Family History  Problem Relation Age of Onset  . Heart disease Mother   . Cancer Mother 82       Pancreatic  . COPD Mother   . Diabetes Mother   . Hyperlipidemia Mother   . Hypertension Mother   . Cancer Maternal Uncle        pancreatic  . Heart disease Maternal Grandmother   . Cancer Maternal Grandmother 84       colon  . COPD Maternal Grandmother   . Diabetes Maternal Grandmother   . Hypertension Maternal Grandmother      Current Outpatient Medications:  .  acyclovir (ZOVIRAX) 400 MG tablet, Take 1 tablet (400  mg total) by mouth 2 (two) times daily., Disp: 60 tablet, Rfl: 2 .  albuterol (VENTOLIN HFA) 108 (90 Base) MCG/ACT inhaler, Inhale 2 puffs into the lungs every 6 (six) hours as needed for wheezing or shortness of breath., Disp: 6.7 g, Rfl: 2 .  amitriptyline (ELAVIL) 25 MG tablet, Take 1 tablet (25 mg total) by mouth at bedtime. For headache prevention and sleep, Disp: 90 tablet, Rfl: 1 .  amLODipine-benazepril (LOTREL) 10-40 MG capsule, Take 1 capsule by mouth daily. for blood pressure., Disp: 90 capsule, Rfl: 1 .  aspirin EC 81 MG tablet, Take by mouth., Disp: , Rfl:  .  baclofen (LIORESAL) 10 MG tablet, Take 1 tablet (10 mg total) by mouth 4 (four) times daily as needed for muscle spasms., Disp: 360 tablet, Rfl: 1 .  carvedilol (COREG) 12.5 MG tablet, Take 1 tablet (12.5 mg total) by mouth 2 (two) times daily., Disp: 180 tablet, Rfl: 1 .  celecoxib (CELEBREX) 200 MG capsule, Take 1 capsule (200 mg total) by mouth daily., Disp: 30 capsule, Rfl: 2 .  cyanocobalamin (VITAMIN B12) 1000 MCG/ML injection, Inject 1 mL (1,000 mcg total) into the muscle every 30 (thirty) days., Disp: 1 mL, Rfl: 11 .  DULoxetine (CYMBALTA) 60 MG capsule, Take 1 capsule (60 mg total) by mouth daily., Disp: 60 capsule, Rfl: 1 .  levofloxacin (LEVAQUIN) 750 MG tablet, Take 1 tablet (750 mg total) by mouth every other day., Disp: 5 tablet, Rfl: 0 .  levothyroxine (SYNTHROID) 50 MCG tablet, Take 1 tablet (50 mcg total) by mouth daily before breakfast., Disp: 30 tablet, Rfl: 2 .  lidocaine-prilocaine (EMLA) cream, Apply 1 Application topically as needed., Disp: 30 g, Rfl: 0 .  OLANZapine (ZYPREXA) 10 MG tablet, Take 1 tablet (10 mg total) by mouth at bedtime. To help with nausea prevention., Disp: 30 tablet, Rfl: 1 .  oxyCODONE (ROXICODONE) 15 MG immediate release tablet, Take 1 tablet (15 mg total) by mouth every 8 (eight) hours as needed for pain., Disp: 90 tablet, Rfl: 0 .  oxyCODONE ER (XTAMPZA ER) 9 MG C12A, Take 9 mg by  mouth every 12 (twelve) hours., Disp: 60 capsule, Rfl: 0 .  pomalidomide (POMALYST) 2 MG capsule, Take 1 capsule (2 mg total) by mouth daily. AND  7 DAYS OFF.  CELGENE-REM : [id number]   DONE ON 01/17/2023, Disp: 21 capsule, Rfl: 0 .  potassium chloride SA (KLOR-CON M) 20 MEQ tablet, Take 2 tablets (40 mEq total) by mouth 2 (two) times daily., Disp: 120 tablet, Rfl: 0 .  pregabalin (LYRICA) 300 MG capsule, Take 1 capsule (300 mg total) by mouth 2 (two) times daily., Disp: 60 capsule, Rfl: 3 .  prochlorperazine (COMPAZINE) 10 MG tablet, Take by mouth every 8 (eight) hours as needed., Disp: , Rfl:  .  montelukast (SINGULAIR) 10 MG tablet, Take 1 tablet in the evening 1 day before your treatment. Day of treatment take 1 tablet in the morning for the first 2 treatments of Daratumumab (Patient not taking: Reported on 03/07/2023), Disp: 4 tablet, Rfl: 0 No current facility-administered medications for this visit.  Facility-Administered Medications Ordered in Other Visits:  .  0.9 %  sodium chloride infusion, , Intravenous, Continuous, James Hines, MD, Stopped at 03/18/23 1043  Physical exam:  Vitals:   03/18/23 0800 03/18/23 0910  BP: (!) 159/95   Pulse: (!) 102   Resp: 18   Temp: (!) 102.3 F (39.1 C) (!) 102.2 F (39 C)  TempSrc: Tympanic Tympanic  SpO2: 98%    Physical Exam Constitutional:      Comments: Sitting in a wheelchair.  Appears fatigued  HENT:     Mouth/Throat:     Mouth: Mucous membranes are moist.     Pharynx: Oropharynx is clear.  Cardiovascular:     Rate and Rhythm: Regular rhythm. Tachycardia present.     Heart sounds: Normal heart sounds.  Pulmonary:     Effort: Pulmonary effort is normal.     Breath sounds: Normal breath sounds.  Abdominal:     General: Bowel sounds are normal.     Palpations: Abdomen is soft.  Lymphadenopathy:     Comments: No palpable cervical adenopathy  Skin:    General: Skin is warm and dry.  Neurological:     Mental Status: He is alert  and oriented to person, place, and time.        Latest Ref Rng & Units 03/18/2023    8:51 AM  CMP  Glucose 70 - 99 mg/dL 981   BUN 6 - 20 mg/dL 11   Creatinine 1.91 - 1.24 mg/dL 4.78   Sodium 295 - 621 mmol/L 139   Potassium 3.5 - 5.1 mmol/L 3.6   Chloride 98 - 111 mmol/L 105   CO2 22 - 32 mmol/L 24   Calcium 8.9 - 10.3 mg/dL 9.1   Total Protein 6.5 - 8.1 g/dL 6.0   Total Bilirubin <3.0 mg/dL 0.6   Alkaline Phos 38 - 126 U/L 82   AST 15 - 41 U/L 32   ALT 0 - 44 U/L 23       Latest Ref Rng & Units 03/18/2023    8:51 AM  CBC  WBC 4.0 - 10.5 K/uL 12.5   Hemoglobin 13.0 - 17.0 g/dL 86.5   Hematocrit 78.4 - 52.0 % 38.4   Platelets 150 - 400 K/uL 260     No images are attached to the encounter.  No results found.   Assessment and plan- Patient is a 52 y.o. male with history of multiple myeloma in relapse currently on carfilzomib Pomalyst regimen here for an acute visit for headache and fever  Clinically there is no focal source of infection.  However he has an elevated white count and tachycardia.  I am holding  off on giving him treatment today.  I will send him a prescription for Levaquin 750 mg every 48 hours for 5 doses to empirically cover him for any possible pneumonia given that he is immunocompromise.  We are checking him for respiratory pathogen's and COVID testing today.  Treatment will be deferred by 1 week.  If he feels worse over the next 1 to 2 days despite antibiotics he will need to go to the ER   Visit Diagnosis 1. Multiple myeloma in relapse (HCC)   2. Acute nasopharyngitis      Dr. Owens Shark, MD, MPH [facility] [phone] 03/18/2023 12:25 PM

## 2023-03-19 LAB — KAPPA/LAMBDA LIGHT CHAINS
Kappa free light chain: 40.5 mg/L — ABNORMAL HIGH (ref 3.3–19.4)
Kappa, lambda light chain ratio: 4.13 — ABNORMAL HIGH (ref 0.26–1.65)
Lambda free light chains: 9.8 mg/L (ref 5.7–26.3)

## 2023-03-23 LAB — MULTIPLE MYELOMA PANEL, SERUM
Albumin SerPl Elph-Mcnc: 3.3 g/dL (ref 2.9–4.4)
Albumin/Glob SerPl: 1.4 (ref 0.7–1.7)
Alpha 1: 0.2 g/dL (ref 0.0–0.4)
Alpha2 Glob SerPl Elph-Mcnc: 0.8 g/dL (ref 0.4–1.0)
B-Globulin SerPl Elph-Mcnc: 0.8 g/dL (ref 0.7–1.3)
Gamma Glob SerPl Elph-Mcnc: 0.5 g/dL (ref 0.4–1.8)
Globulin, Total: 2.4 g/dL (ref 2.2–3.9)
IgA: 80 mg/dL — ABNORMAL LOW (ref 90–386)
IgG (Immunoglobin G), Serum: 585 mg/dL — ABNORMAL LOW (ref 603–1613)
IgM (Immunoglobulin M), Srm: 42 mg/dL (ref 20–172)
Total Protein ELP: 5.7 g/dL — ABNORMAL LOW (ref 6.0–8.5)

## 2023-03-24 ENCOUNTER — Other Ambulatory Visit: Payer: Self-pay

## 2023-03-24 DIAGNOSIS — T451X5A Adverse effect of antineoplastic and immunosuppressive drugs, initial encounter: Secondary | ICD-10-CM

## 2023-03-24 DIAGNOSIS — M792 Neuralgia and neuritis, unspecified: Secondary | ICD-10-CM

## 2023-03-25 ENCOUNTER — Ambulatory Visit: Payer: BC Managed Care – PPO

## 2023-03-25 ENCOUNTER — Ambulatory Visit: Payer: BC Managed Care – PPO | Admitting: Oncology

## 2023-03-25 ENCOUNTER — Inpatient Hospital Stay: Payer: Medicare HMO | Attending: Internal Medicine

## 2023-03-25 ENCOUNTER — Inpatient Hospital Stay: Payer: BC Managed Care – PPO

## 2023-03-25 ENCOUNTER — Other Ambulatory Visit: Payer: BC Managed Care – PPO

## 2023-03-25 VITALS — BP 146/92 | HR 83 | Temp 98.7°F | Resp 20 | Wt 180.8 lb

## 2023-03-25 DIAGNOSIS — C9002 Multiple myeloma in relapse: Secondary | ICD-10-CM | POA: Insufficient documentation

## 2023-03-25 DIAGNOSIS — Z5112 Encounter for antineoplastic immunotherapy: Secondary | ICD-10-CM | POA: Insufficient documentation

## 2023-03-25 DIAGNOSIS — Z79899 Other long term (current) drug therapy: Secondary | ICD-10-CM | POA: Insufficient documentation

## 2023-03-25 LAB — CBC WITH DIFFERENTIAL (CANCER CENTER ONLY)
Abs Immature Granulocytes: 0.03 10*3/uL (ref 0.00–0.07)
Basophils Absolute: 0 10*3/uL (ref 0.0–0.1)
Basophils Relative: 1 %
Eosinophils Absolute: 0.3 10*3/uL (ref 0.0–0.5)
Eosinophils Relative: 6 %
HCT: 40.2 % (ref 39.0–52.0)
Hemoglobin: 13.9 g/dL (ref 13.0–17.0)
Immature Granulocytes: 1 %
Lymphocytes Relative: 26 %
Lymphs Abs: 1.2 10*3/uL (ref 0.7–4.0)
MCH: 32.2 pg (ref 26.0–34.0)
MCHC: 34.6 g/dL (ref 30.0–36.0)
MCV: 93.1 fL (ref 80.0–100.0)
Monocytes Absolute: 0.4 10*3/uL (ref 0.1–1.0)
Monocytes Relative: 9 %
Neutro Abs: 2.8 10*3/uL (ref 1.7–7.7)
Neutrophils Relative %: 57 %
Platelet Count: 235 10*3/uL (ref 150–400)
RBC: 4.32 MIL/uL (ref 4.22–5.81)
RDW: 13.6 % (ref 11.5–15.5)
WBC Count: 4.8 10*3/uL (ref 4.0–10.5)
nRBC: 0 % (ref 0.0–0.2)

## 2023-03-25 LAB — CMP (CANCER CENTER ONLY)
ALT: 14 U/L (ref 0–44)
AST: 21 U/L (ref 15–41)
Albumin: 3.6 g/dL (ref 3.5–5.0)
Alkaline Phosphatase: 78 U/L (ref 38–126)
Anion gap: 9 (ref 5–15)
BUN: 11 mg/dL (ref 6–20)
CO2: 25 mmol/L (ref 22–32)
Calcium: 8.8 mg/dL — ABNORMAL LOW (ref 8.9–10.3)
Chloride: 105 mmol/L (ref 98–111)
Creatinine: 1.73 mg/dL — ABNORMAL HIGH (ref 0.61–1.24)
GFR, Estimated: 47 mL/min — ABNORMAL LOW (ref >60)
Glucose, Bld: 103 mg/dL — ABNORMAL HIGH (ref 70–99)
Potassium: 3.7 mmol/L (ref 3.5–5.1)
Sodium: 139 mmol/L (ref 135–145)
Total Bilirubin: 0.9 mg/dL (ref <1.2)
Total Protein: 6.2 g/dL — ABNORMAL LOW (ref 6.5–8.1)

## 2023-03-25 MED ORDER — SODIUM CHLORIDE 0.9% FLUSH
10.0000 mL | INTRAVENOUS | Status: DC | PRN
  Filled 2023-03-25: qty 10

## 2023-03-25 MED ORDER — SODIUM CHLORIDE 0.9 % IV SOLN
Freq: Once | INTRAVENOUS | Status: AC
Start: 2023-03-25 — End: 2023-03-25
  Filled 2023-03-25: qty 250

## 2023-03-25 MED ORDER — SODIUM CHLORIDE 0.9% FLUSH
10.0000 mL | INTRAVENOUS | Status: DC | PRN
Start: 2023-03-25 — End: 2023-03-25
  Administered 2023-03-25: 10 mL via INTRAVENOUS
  Filled 2023-03-25: qty 10

## 2023-03-25 MED ORDER — DEXAMETHASONE SODIUM PHOSPHATE 10 MG/ML IJ SOLN
10.0000 mg | Freq: Once | INTRAMUSCULAR | Status: AC
  Administered 2023-03-25: 10 mg via INTRAVENOUS
  Filled 2023-03-25: qty 1

## 2023-03-25 MED ORDER — HEPARIN SOD (PORK) LOCK FLUSH 100 UNIT/ML IV SOLN
500.0000 [IU] | Freq: Once | INTRAVENOUS | Status: DC | PRN
Start: 2023-03-25 — End: 2023-03-25
  Filled 2023-03-25: qty 5

## 2023-03-25 MED ORDER — DEXTROSE 5 % IV SOLN
20.0000 mg/m2 | Freq: Once | INTRAVENOUS | Status: AC
  Administered 2023-03-25: 36 mg via INTRAVENOUS
  Filled 2023-03-25: qty 15

## 2023-03-25 NOTE — Patient Instructions (Signed)
[facility] - A DEPT OF [facility]  Discharge Instructions: Thank you for choosing [facility] to provide your oncology and hematology care.  If you have a lab appointment with the Cancer Center, please go directly to the Cancer Center and check in at the registration area.  Wear comfortable clothing and clothing appropriate for easy access to any Portacath or PICC line.   We strive to give you quality time with your provider. You may need to reschedule your appointment if you arrive late (15 or more minutes).  Arriving late affects you and other patients whose appointments are after yours.  Also, if you miss three or more appointments without notifying the office, you may be dismissed from the clinic at the provider's discretion.      For prescription refill requests, have your pharmacy contact our office and allow 72 hours for refills to be completed.    Today you received the following chemotherapy and/or immunotherapy agents kyprolis      To help prevent nausea and vomiting after your treatment, we encourage you to take your nausea medication as directed.  BELOW ARE SYMPTOMS THAT SHOULD BE REPORTED IMMEDIATELY: *FEVER GREATER THAN 100.4 F (38 C) OR HIGHER *CHILLS OR SWEATING *NAUSEA AND VOMITING THAT IS NOT CONTROLLED WITH YOUR NAUSEA MEDICATION *UNUSUAL SHORTNESS OF BREATH *UNUSUAL BRUISING OR BLEEDING *URINARY PROBLEMS (pain or burning when urinating, or frequent urination) *BOWEL PROBLEMS (unusual diarrhea, constipation, pain near the anus) TENDERNESS IN MOUTH AND THROAT WITH OR WITHOUT PRESENCE OF ULCERS (sore throat, sores in mouth, or a toothache) UNUSUAL RASH, SWELLING OR PAIN  UNUSUAL VAGINAL DISCHARGE OR ITCHING   Items with * indicate a potential emergency and should be followed up as soon as possible or go to the Emergency Department if any problems should occur.  Please show the CHEMOTHERAPY ALERT CARD or IMMUNOTHERAPY  ALERT CARD at check-in to the Emergency Department and triage nurse.  Should you have questions after your visit or need to cancel or reschedule your appointment, please contact [facility] - A DEPT OF [facility]  [phone] and follow the prompts.  Office hours are 8:00 a.m. to 4:30 p.m. Monday - Friday. Please note that voicemails left after 4:00 p.m. may not be returned until the following business day.  We are closed weekends and major holidays. You have access to a nurse at all times for urgent questions. Please call the main number to the clinic [phone] and follow the prompts.  For any non-urgent questions, you may also contact your provider using MyChart. We now offer e-Visits for anyone 61 and older to request care online for non-urgent symptoms. For details visit [email or web address].   Also download the MyChart app! Go to the app store, search "MyChart", open the app, select [facility], and log in with your MyChart username and password.

## 2023-03-26 ENCOUNTER — Ambulatory Visit (INDEPENDENT_AMBULATORY_CARE_PROVIDER_SITE_OTHER): Payer: Medicare HMO | Admitting: Adult Health

## 2023-03-26 ENCOUNTER — Encounter: Payer: Self-pay | Admitting: Oncology

## 2023-03-26 ENCOUNTER — Encounter: Payer: Self-pay | Admitting: Adult Health

## 2023-03-26 ENCOUNTER — Other Ambulatory Visit: Payer: Self-pay

## 2023-03-26 VITALS — BP 151/82 | HR 94 | Temp 98.1°F | Ht 67.0 in | Wt 178.6 lb

## 2023-03-26 DIAGNOSIS — R0683 Snoring: Secondary | ICD-10-CM

## 2023-03-26 MED ORDER — PREGABALIN 300 MG PO CAPS: 300.0000 mg | ORAL_CAPSULE | Freq: Two times a day (BID) | ORAL | 0 refills | Status: DC

## 2023-03-26 NOTE — Progress Notes (Signed)
Error -sleep study is still pending , will call when results are back

## 2023-03-27 ENCOUNTER — Encounter: Payer: Self-pay | Admitting: Oncology

## 2023-03-28 DIAGNOSIS — R0602 Shortness of breath: Secondary | ICD-10-CM | POA: Diagnosis not present

## 2023-03-28 DIAGNOSIS — M1990 Unspecified osteoarthritis, unspecified site: Secondary | ICD-10-CM | POA: Diagnosis not present

## 2023-03-28 DIAGNOSIS — G629 Polyneuropathy, unspecified: Secondary | ICD-10-CM | POA: Diagnosis not present

## 2023-03-28 DIAGNOSIS — Z791 Long term (current) use of non-steroidal anti-inflammatories (NSAID): Secondary | ICD-10-CM | POA: Diagnosis not present

## 2023-03-28 DIAGNOSIS — J988 Other specified respiratory disorders: Secondary | ICD-10-CM | POA: Diagnosis not present

## 2023-03-28 DIAGNOSIS — R110 Nausea: Secondary | ICD-10-CM | POA: Diagnosis not present

## 2023-03-28 DIAGNOSIS — M62838 Other muscle spasm: Secondary | ICD-10-CM | POA: Diagnosis not present

## 2023-03-28 DIAGNOSIS — Z008 Encounter for other general examination: Secondary | ICD-10-CM | POA: Diagnosis not present

## 2023-03-28 DIAGNOSIS — I10 Essential (primary) hypertension: Secondary | ICD-10-CM | POA: Diagnosis not present

## 2023-03-28 DIAGNOSIS — Z8249 Family history of ischemic heart disease and other diseases of the circulatory system: Secondary | ICD-10-CM | POA: Diagnosis not present

## 2023-03-28 DIAGNOSIS — F32A Depression, unspecified: Secondary | ICD-10-CM | POA: Diagnosis not present

## 2023-03-28 DIAGNOSIS — Z87891 Personal history of nicotine dependence: Secondary | ICD-10-CM | POA: Diagnosis not present

## 2023-03-28 DIAGNOSIS — Z809 Family history of malignant neoplasm, unspecified: Secondary | ICD-10-CM | POA: Diagnosis not present

## 2023-04-01 ENCOUNTER — Inpatient Hospital Stay: Payer: Medicare HMO

## 2023-04-01 ENCOUNTER — Ambulatory Visit: Payer: BC Managed Care – PPO

## 2023-04-01 ENCOUNTER — Inpatient Hospital Stay (HOSPITAL_BASED_OUTPATIENT_CLINIC_OR_DEPARTMENT_OTHER): Payer: Medicare HMO | Admitting: Oncology

## 2023-04-01 ENCOUNTER — Encounter: Payer: Self-pay | Admitting: Oncology

## 2023-04-01 ENCOUNTER — Other Ambulatory Visit: Payer: BC Managed Care – PPO

## 2023-04-01 VITALS — BP 160/105 | HR 96 | Temp 96.5°F | Resp 19 | Wt 176.6 lb

## 2023-04-01 VITALS — BP 152/95 | HR 73 | Resp 18

## 2023-04-01 DIAGNOSIS — C9002 Multiple myeloma in relapse: Secondary | ICD-10-CM

## 2023-04-01 DIAGNOSIS — Z5111 Encounter for antineoplastic chemotherapy: Secondary | ICD-10-CM

## 2023-04-01 DIAGNOSIS — Z79899 Other long term (current) drug therapy: Secondary | ICD-10-CM

## 2023-04-01 DIAGNOSIS — C9000 Multiple myeloma not having achieved remission: Secondary | ICD-10-CM | POA: Diagnosis not present

## 2023-04-01 DIAGNOSIS — Z5112 Encounter for antineoplastic immunotherapy: Secondary | ICD-10-CM | POA: Diagnosis not present

## 2023-04-01 LAB — CBC WITH DIFFERENTIAL (CANCER CENTER ONLY)
Abs Immature Granulocytes: 0.04 10*3/uL (ref 0.00–0.07)
Basophils Absolute: 0 10*3/uL (ref 0.0–0.1)
Basophils Relative: 1 %
Eosinophils Absolute: 0.3 10*3/uL (ref 0.0–0.5)
Eosinophils Relative: 5 %
HCT: 39 % (ref 39.0–52.0)
Hemoglobin: 13.7 g/dL (ref 13.0–17.0)
Immature Granulocytes: 1 %
Lymphocytes Relative: 16 %
Lymphs Abs: 1 10*3/uL (ref 0.7–4.0)
MCH: 32.5 pg (ref 26.0–34.0)
MCHC: 35.1 g/dL (ref 30.0–36.0)
MCV: 92.6 fL (ref 80.0–100.0)
Monocytes Absolute: 0.4 10*3/uL (ref 0.1–1.0)
Monocytes Relative: 6 %
Neutro Abs: 4.5 10*3/uL (ref 1.7–7.7)
Neutrophils Relative %: 71 %
Platelet Count: 187 10*3/uL (ref 150–400)
RBC: 4.21 MIL/uL — ABNORMAL LOW (ref 4.22–5.81)
RDW: 13.6 % (ref 11.5–15.5)
WBC Count: 6.2 10*3/uL (ref 4.0–10.5)
nRBC: 0 % (ref 0.0–0.2)

## 2023-04-01 LAB — CMP (CANCER CENTER ONLY)
ALT: 19 U/L (ref 0–44)
AST: 30 U/L (ref 15–41)
Albumin: 3.6 g/dL (ref 3.5–5.0)
Alkaline Phosphatase: 74 U/L (ref 38–126)
Anion gap: 10 (ref 5–15)
BUN: 10 mg/dL (ref 6–20)
CO2: 22 mmol/L (ref 22–32)
Calcium: 8.9 mg/dL (ref 8.9–10.3)
Chloride: 105 mmol/L (ref 98–111)
Creatinine: 1.54 mg/dL — ABNORMAL HIGH (ref 0.61–1.24)
GFR, Estimated: 54 mL/min — ABNORMAL LOW (ref >60)
Glucose, Bld: 162 mg/dL — ABNORMAL HIGH (ref 70–99)
Potassium: 3.4 mmol/L — ABNORMAL LOW (ref 3.5–5.1)
Sodium: 137 mmol/L (ref 135–145)
Total Bilirubin: 1.3 mg/dL — ABNORMAL HIGH (ref <1.2)
Total Protein: 6.1 g/dL — ABNORMAL LOW (ref 6.5–8.1)

## 2023-04-01 MED ORDER — DEXAMETHASONE SODIUM PHOSPHATE 10 MG/ML IJ SOLN
10.0000 mg | Freq: Once | INTRAMUSCULAR | Status: AC
  Administered 2023-04-01: 10 mg via INTRAVENOUS
  Filled 2023-04-01: qty 1

## 2023-04-01 MED ORDER — SODIUM CHLORIDE 0.9 % IV SOLN
Freq: Once | INTRAVENOUS | Status: AC
Start: 2023-04-01 — End: 2023-04-01
  Filled 2023-04-01: qty 250

## 2023-04-01 MED ORDER — DEXTROSE 5 % IV SOLN
20.0000 mg/m2 | Freq: Once | INTRAVENOUS | Status: AC
  Administered 2023-04-01: 36 mg via INTRAVENOUS
  Filled 2023-04-01: qty 13

## 2023-04-01 MED ORDER — HEPARIN SOD (PORK) LOCK FLUSH 100 UNIT/ML IV SOLN
500.0000 [IU] | Freq: Once | INTRAVENOUS | Status: AC | PRN
  Administered 2023-04-01: 500 [IU]
  Filled 2023-04-01: qty 5

## 2023-04-01 NOTE — Progress Notes (Signed)
Hematology/Oncology Consult note [facility]  [phone] [phone]  Patient Care Team: Doreene Nest, NP as PCP - General (Internal Medicine) Martin Majestic, MD as PCP - Hematology/Oncology Debbe Odea, MD as PCP - Cardiology (Cardiology) Creig Hines, MD as Consulting Physician (Hematology and Oncology)   Name of the patient: James House  [id number]  June 25, 1970   Date of visit: 04/01/23  Diagnosis-recurrent kappa light chain multiple myeloma in relapse  Chief complaint/ Reason for visit-on treatment assessment prior to cycle 9-day 8 of carfilzomib  Heme/Onc history: Patient is a 52- year-old male with a history of kappa light chain myeloma that was treated by [name].  History is as follows:   1.  Patient presented with renal insufficiency with a creatinine of 2.4 in April 2018.  Kidney biopsy showed myeloma cast nephropathy.  Kappa light chain was 3004 and lambda 0.22 with a kappa lambda ratio of 13,000 655.  Skeletal survey showed multiple calvarial and marrow lesions.  Bone marrow biopsy in May 2018 showed 43% plasma cells.  He received CyBorD for cycle 1 in May 2018 and was subsequently switched to RVD.  He received 4 cycles followed by a repeat bone marrow biopsy in August 2018 which showed no increase in plasma cells.   2.  He underwent autologous stem cell transplantation on 01/30/2017.  He was started on Revlimid maintenance 10 mg daily in January 2019.   3.  Labs from January February and March 2019 done at [facility] showed no M spike, normal kappa lambda ratio of 1.06.  He was last seen by them in April 2019.   4. Following that patient was admitted to [facility] for healthcare associated pneumonia and was recently discharged.  He wished to transfer his care to [place] at this time.  He was on maintenance Revlimid 5 mg which was then reduced to 2.5 mg.  Follows up with pain clinic for chemo-induced peripheral neuropathy with  [name].  His urine tested positive for [facility] and therefore patient was discharged from pain clinic   5.  Noted to have gradually increasing free kappa light chainFrom 15 in 20 21-39.5 with a ratio of 3.09.  He was seen by [facility] again and underwent a repeat bone marrow biopsy which did not show any evidence of clonal plasma cells.  PET CT scan also did not show any new active lytic lesions in May 2023.Marland Kitchen  Revlimid dose was increased to 15 mg 3 weeks on 1 week off but kappa light chains show continuing to increase   6.  PET CT scan in December 2023 did not show any evidence of active myeloma.  Patient underwent repeat bone marrow biopsy at [facility] which showed 6% plasma cellsConsistent with persistent plasma cell neoplasm.  74 to 86% of isolated plasma cells show loss of T p53 and monosomy 13.  Patient was seen by [name] at [facility] and was recommended Darzalex Pomalyst dexamethasone regimen   7.  Disease progression after 8 weekly cycles of Darazalex kappa light chain increased from 332-646 with a ratio that went up from 39-57 concerning for disease progression.  Patient switched to carfilzomib Pomalyst dexamethasone regimen    Interval history-overall he is feeling better after he completed his course of antibiotics which was given for possible pneumonia last week.  He continues to have fatigue and occasional exertional shortness of breath as well as generalized bodyaches  ECOG PS- 2 Pain scale- 4 Opioid associated constipation- no  Review of systems- Review of Systems  Constitutional:  Negative for chills, fever, malaise/fatigue and weight loss.  HENT:  Negative for congestion, ear discharge and nosebleeds.   Eyes:  Negative for blurred vision.  Respiratory:  Negative for cough, hemoptysis, sputum production, shortness of breath and wheezing.   Cardiovascular:  Negative for chest pain, palpitations, orthopnea and claudication.  Gastrointestinal:  Negative for abdominal pain, blood in stool,  constipation, diarrhea, heartburn, melena, nausea and vomiting.  Genitourinary:  Negative for dysuria, flank pain, frequency, hematuria and urgency.  Musculoskeletal:  Negative for back pain, joint pain and myalgias.  Skin:  Negative for rash.  Neurological:  Negative for dizziness, tingling, focal weakness, seizures, weakness and headaches.  Endo/Heme/Allergies:  Does not bruise/bleed easily.  Psychiatric/Behavioral:  Negative for depression and suicidal ideas. The patient does not have insomnia.       Allergies  Allergen Reactions   Morphine Nausea And Vomiting     Past Medical History:  Diagnosis Date   CKD (chronic kidney disease) stage 3, GFR 30-59 ml/min (HCC)    Elevated sedimentation rate 12/24/2017   Hypertension    Multiple myeloma (HCC) 08/11/2017   Neuropathy    Pneumonia    Severe sepsis (HCC) 08/11/2017   Shingles 08/03/2019     Past Surgical History:  Procedure Laterality Date   ABDOMINAL SURGERY     COLONOSCOPY WITH PROPOFOL N/A 05/25/2021   Procedure: COLONOSCOPY WITH PROPOFOL;  Surgeon: Toney Reil, MD;  Location: [facility] ENDOSCOPY;  Service: Gastroenterology;  Laterality: N/A;   IR IMAGING GUIDED PORT INSERTION  08/02/2022   JOINT REPLACEMENT     right knee   KNEE SURGERY Left     Social History   Socioeconomic History   Marital status: Single    Spouse name: [name]   Number of children: 3   Years of education: Not on file   Highest education level: Not on file  Occupational History   Not on file  Tobacco Use   Smoking status: Former   Smokeless tobacco: Current    Types: Snuff  Vaping Use   Vaping status: Never Used  Substance and Sexual Activity   Alcohol use: No   Drug use: No   Sexual activity: Yes  Other Topics Concern   Not on file  Social History Narrative   Live in private residence with spouse and mother in law   Social Determinants of Health   Financial Resource Strain: Low Risk  (08/12/2017)   Overall Financial  Resource Strain (CARDIA)    Difficulty of Paying Living Expenses: Not hard at all  Food Insecurity: No Food Insecurity (08/12/2017)   Hunger Vital Sign    Worried About Running Out of Food in the Last Year: Never true    Ran Out of Food in the Last Year: Never true  Transportation Needs: No Transportation Needs (08/12/2017)   PRAPARE - Administrator, Civil Service (Medical): No    Lack of Transportation (Non-Medical): No  Physical Activity: Sufficiently Active (08/12/2017)   Exercise Vital Sign    Days of Exercise per Week: 7 days    Minutes of Exercise per Session: 30 min  Stress: No Stress Concern Present (08/12/2017)   [employer] of Occupational Health - Occupational Stress Questionnaire    Feeling of Stress : Only a little  Social Connections: Somewhat Isolated (08/12/2017)   Social Connection and Isolation Panel [NHANES]    Frequency of Communication with Friends and Family: Once a week  Frequency of Social Gatherings with Friends and Family: Once a week    Attends Religious Services: More than 4 times per year    Active Member of [employer] or Organizations: No    Attends Banker Meetings: Never    Marital Status: Married  Catering manager Violence: Not At Risk (08/12/2017)   Humiliation, Afraid, Rape, and Kick questionnaire    Fear of Current or Ex-Partner: No    Emotionally Abused: No    Physically Abused: No    Sexually Abused: No    Family History  Problem Relation Age of Onset   Heart disease Mother    Cancer Mother 24       Pancreatic   COPD Mother    Diabetes Mother    Hyperlipidemia Mother    Hypertension Mother    Cancer Maternal Uncle        pancreatic   Heart disease Maternal Grandmother    Cancer Maternal Grandmother 62       colon   COPD Maternal Grandmother    Diabetes Maternal Grandmother    Hypertension Maternal Grandmother      Current Outpatient Medications:    acyclovir (ZOVIRAX) 400 MG tablet, Take 1 tablet (400 mg  total) by mouth 2 (two) times daily., Disp: 60 tablet, Rfl: 2   albuterol (VENTOLIN HFA) 108 (90 Base) MCG/ACT inhaler, Inhale 2 puffs into the lungs every 6 (six) hours as needed for wheezing or shortness of breath., Disp: 6.7 g, Rfl: 2   amitriptyline (ELAVIL) 25 MG tablet, Take 1 tablet (25 mg total) by mouth at bedtime. For headache prevention and sleep, Disp: 90 tablet, Rfl: 1   amLODipine-benazepril (LOTREL) 10-40 MG capsule, Take 1 capsule by mouth daily. for blood pressure., Disp: 90 capsule, Rfl: 1   aspirin EC 81 MG tablet, Take by mouth., Disp: , Rfl:    baclofen (LIORESAL) 10 MG tablet, Take 1 tablet (10 mg total) by mouth 4 (four) times daily as needed for muscle spasms., Disp: 360 tablet, Rfl: 1   carvedilol (COREG) 12.5 MG tablet, Take 1 tablet (12.5 mg total) by mouth 2 (two) times daily., Disp: 180 tablet, Rfl: 1   celecoxib (CELEBREX) 200 MG capsule, Take 1 capsule (200 mg total) by mouth daily., Disp: 30 capsule, Rfl: 2   cyanocobalamin (VITAMIN B12) 1000 MCG/ML injection, Inject 1 mL (1,000 mcg total) into the muscle every 30 (thirty) days., Disp: 1 mL, Rfl: 11   DULoxetine (CYMBALTA) 60 MG capsule, Take 1 capsule (60 mg total) by mouth daily., Disp: 60 capsule, Rfl: 1   levofloxacin (LEVAQUIN) 750 MG tablet, Take 1 tablet (750 mg total) by mouth every other day., Disp: 5 tablet, Rfl: 0   levothyroxine (SYNTHROID) 50 MCG tablet, Take 1 tablet (50 mcg total) by mouth daily before breakfast., Disp: 30 tablet, Rfl: 2   lidocaine-prilocaine (EMLA) cream, Apply 1 Application topically as needed., Disp: 30 g, Rfl: 0   OLANZapine (ZYPREXA) 10 MG tablet, Take 1 tablet (10 mg total) by mouth at bedtime. To help with nausea prevention., Disp: 30 tablet, Rfl: 1   oxyCODONE (ROXICODONE) 15 MG immediate release tablet, Take 1 tablet (15 mg total) by mouth every 8 (eight) hours as needed for pain., Disp: 90 tablet, Rfl: 0   oxyCODONE ER (XTAMPZA ER) 9 MG C12A, Take 9 mg by mouth every 12  (twelve) hours., Disp: 60 capsule, Rfl: 0   pomalidomide (POMALYST) 2 MG capsule, Take 1 capsule (2 mg total) by mouth daily. AND  7 DAYS OFF.  CELGENE-REM : [id number]   DONE ON 01/17/2023, Disp: 21 capsule, Rfl: 0   potassium chloride SA (KLOR-CON M) 20 MEQ tablet, Take 2 tablets (40 mEq total) by mouth 2 (two) times daily., Disp: 120 tablet, Rfl: 0   pregabalin (LYRICA) 300 MG capsule, Take 1 capsule (300 mg total) by mouth 2 (two) times daily., Disp: 180 capsule, Rfl: 0   prochlorperazine (COMPAZINE) 10 MG tablet, Take by mouth every 8 (eight) hours as needed., Disp: , Rfl:    montelukast (SINGULAIR) 10 MG tablet, Take 1 tablet in the evening 1 day before your treatment. Day of treatment take 1 tablet in the morning for the first 2 treatments of Daratumumab (Patient not taking: Reported on 03/26/2023), Disp: 4 tablet, Rfl: 0  Physical exam:  Vitals:   04/01/23 0910 04/01/23 0926 04/01/23 0951  BP: (!) 148/103 (!) 153/99 (!) 160/105  Pulse: 96    Resp: 19    Temp: (!) 96.5 F (35.8 C)    SpO2: 99%    Weight: 176 lb 9.6 oz (80.1 kg)     Physical Exam Cardiovascular:     Rate and Rhythm: Normal rate and regular rhythm.     Heart sounds: Normal heart sounds.  Pulmonary:     Effort: Pulmonary effort is normal.     Breath sounds: Normal breath sounds.  Skin:    General: Skin is warm and dry.  Neurological:     Mental Status: He is alert and oriented to person, place, and time.         Latest Ref Rng & Units 04/01/2023    8:58 AM  CMP  Glucose 70 - 99 mg/dL 696   BUN 6 - 20 mg/dL 10   Creatinine 2.95 - 1.24 mg/dL 2.84   Sodium 132 - 440 mmol/L 137   Potassium 3.5 - 5.1 mmol/L 3.4   Chloride 98 - 111 mmol/L 105   CO2 22 - 32 mmol/L 22   Calcium 8.9 - 10.3 mg/dL 8.9   Total Protein 6.5 - 8.1 g/dL 6.1   Total Bilirubin <1.0 mg/dL 1.3   Alkaline Phos 38 - 126 U/L 74   AST 15 - 41 U/L 30   ALT 0 - 44 U/L 19       Latest Ref Rng & Units 04/01/2023    8:58 AM  CBC  WBC 4.0 -  10.5 K/uL 6.2   Hemoglobin 13.0 - 17.0 g/dL 27.2   Hematocrit 53.6 - 52.0 % 39.0   Platelets 150 - 400 K/uL 187      Assessment and plan- Patient is a 52 y.o. male with history of kappa light chain multiple myeloma in relapse here for on treatment assessment prior to cycle 9 day 8 of carfilzomib and Pomalyst.  Counts okay to proceed with cycle 9 of carfilzomib day 8 today.  He will directly proceed for cycle 9-day 15 next week.  He will be seen by covering provider in 2 weeks for cycle 10-day 1 and I will see him back for cycle 10-day 15 of treatment.  Serum free light chain ratio has remained stable over the last 1 month.  He will continue with Pomalyst 3 weeks on and 1 week off.  He will stay on aspirin and acyclovir prophylaxis.    Patient has chronic back pain as well as pain from his peripheral neuropathy for which he takes as needed oxycodone.  We will refill his prescription for the same in 2 days  Visit Diagnosis 1. Encounter for antineoplastic chemotherapy   2. High risk medication use   3. Multiple myeloma not having achieved remission (HCC)      Dr. Owens Shark, MD, MPH [facility] [phone] 04/01/2023 9:52 AM

## 2023-04-02 ENCOUNTER — Other Ambulatory Visit: Payer: Self-pay

## 2023-04-03 ENCOUNTER — Ambulatory Visit: Admission: RE | Admit: 2023-04-03 | Payer: Medicare HMO | Source: Ambulatory Visit

## 2023-04-04 ENCOUNTER — Other Ambulatory Visit: Payer: Self-pay | Admitting: Oncology

## 2023-04-04 ENCOUNTER — Encounter: Payer: Self-pay | Admitting: Oncology

## 2023-04-04 ENCOUNTER — Other Ambulatory Visit: Payer: Self-pay | Admitting: *Deleted

## 2023-04-04 ENCOUNTER — Other Ambulatory Visit: Payer: Self-pay

## 2023-04-04 MED FILL — Oxycodone HCl Tab 15 MG: ORAL | 30 days supply | Qty: 90 | Fill #0 | Status: AC

## 2023-04-05 ENCOUNTER — Telehealth: Payer: Self-pay | Admitting: Pulmonary Disease

## 2023-04-05 DIAGNOSIS — G4733 Obstructive sleep apnea (adult) (pediatric): Secondary | ICD-10-CM

## 2023-04-05 NOTE — Progress Notes (Unsigned)
Cardiology Clinic Note   Date: 04/07/2023 ID: [name], DOB 1970-07-06, MRN [id number]  Primary Cardiologist:  Debbe Odea, MD  Patient Profile    James Craddick. is a 52 y.o. male who presents to the clinic today for follow up after medication changes.    Past medical history significant for: Hypertension. CKD stage III. Multiple myeloma. Currently on chemo. Echo 10/04/2022: EF 55 to 60%.  No RWMA.  Grade I DD.  Normal RV size/function.  Normal PA pressure.  Mild MR.   In summary, patient was first seen in the office on 02/06/2023 for evaluation of shortness of breath and increasing BP with no cardiac history at the request of Dr. Chestine Spore. PCP increased amlodipine and benazepril secondary to elevated BP. He recently started new chemo medication with noted side effect of hypertension and increased heart rate. He does report elevated heart rate and occasional palpitations with typical HR in the high 90s to low 100s. Patient was started on carvedilol.      History of Present Illness    James Dacko. is followed by Dr. Azucena Cecil for the above outlined history.  Patient was last seen in the office by me on 03/07/2023 to follow-up after medication changes.  BP continued to be elevated with home readings as high as 158-160/101-120.  He reported taking home BP on wrist cuff and proper technique was discussed.  He reported mild dizziness and flushed face when BP is high however the symptoms are also a side effect of his chemotherapy.  Carvedilol was increased.  Today, patient is doing well. He reports last week he had a few days of elevated BP but it began trending down and has been better controled overall. He does develop a headache when BP is elevated. He brings his wrist cuff in today to compare readings. He is otherwise doing well with no chest pain or shortness of breath. He has noticed some swelling in his hands that may be secondary to his new chemo medication. He is  going to speak to his oncologist about this.      ROS: All other systems reviewed and are otherwise negative except as noted in History of Present Illness.  Studies Reviewed    EKG is not ordered today.    Physical Exam    VS:  BP 112/80 (BP Location: Left Arm, Patient Position: Sitting, Cuff Size: Large)   Pulse 97   Ht 5\' 7"  (1.702 m)   Wt 180 lb 8 oz (81.9 kg)   SpO2 96%   BMI 28.27 kg/m  , BMI Body mass index is 28.27 kg/m.  GEN: Well nourished, well developed, in no acute distress. Neck: No JVD or carotid bruits. Cardiac:  RRR. No murmurs. No rubs or gallops.   Respiratory:  Respirations regular and unlabored. Clear to auscultation without rales, wheezing or rhonchi. GI: Soft, nontender, nondistended. Extremities: Radials/DP/PT 2+ and equal bilaterally. No clubbing or cyanosis. No edema.  Skin: Warm and dry, no rash. Neuro: Strength intact.  Assessment & Plan   Hypertension Elevation in BP secondary to chemo.  BP today 110/70. Home BP readings have been well controlled except for a few days last week. He reports associated headache with elevated BP. His home monitor reads slightly higher than manual BP but not different enough to obtain a new cuff at this point.  -Continue to monitor BP at home once a day.  -Continue amlodipine-benazepril, carvedilol.    Risk of Falls Patient has  a history of falls and is now using a cane to prevent further incidents. No recent falls reported. -Continue use of cane to prevent falls.  Disposition: Return in 3 months or sooner as needed.          Signed, James Grandchild. Justyna Timoney, DNP, NP-C

## 2023-04-05 NOTE — Telephone Encounter (Signed)
Sleep study shows very severe central sleep apnea. Need in lab titration study . Order placed.

## 2023-04-05 NOTE — Telephone Encounter (Signed)
Call patient  Sleep study result  Date of study: 03/16/2023  Impression: Very severe central sleep apnea with mild oxygen desaturations  Recommendation:  Recommend in-lab titration study for optimization of treatment of central sleep apnea  Opioid medications need optimized as this is likely contributing significantly to the severe central sleep apnea  Close clinical follow-up recommended

## 2023-04-07 ENCOUNTER — Encounter (INDEPENDENT_AMBULATORY_CARE_PROVIDER_SITE_OTHER): Payer: Self-pay | Admitting: *Deleted

## 2023-04-07 ENCOUNTER — Ambulatory Visit: Payer: Medicare HMO | Attending: Student | Admitting: Student

## 2023-04-07 ENCOUNTER — Encounter: Payer: Self-pay | Admitting: Student

## 2023-04-07 VITALS — BP 112/80 | HR 97 | Ht 67.0 in | Wt 180.5 lb

## 2023-04-07 DIAGNOSIS — Z9181 History of falling: Secondary | ICD-10-CM | POA: Diagnosis not present

## 2023-04-07 DIAGNOSIS — I10 Essential (primary) hypertension: Secondary | ICD-10-CM

## 2023-04-07 DIAGNOSIS — G4731 Primary central sleep apnea: Secondary | ICD-10-CM

## 2023-04-07 NOTE — Patient Instructions (Signed)
Medication Instructions:  No changes at this time.   *If you need a refill on your cardiac medications before your next appointment, please call your pharmacy*   Lab Work: None  If you have labs (blood work) drawn today and your tests are completely normal, you will receive your results only by: MyChart Message (if you have MyChart) OR A paper copy in the mail If you have any lab test that is abnormal or we need to change your treatment, we will call you to review the results.   Testing/Procedures: None   Follow-Up: At [facility], you and your health needs are our priority.  As part of our continuing mission to provide you with exceptional heart care, we have created designated Provider Care Teams.  These Care Teams include your primary Cardiologist (physician) and Advanced Practice Providers (APPs -  Physician Assistants and Nurse Practitioners) who all work together to provide you with the care you need, when you need it.   Your next appointment:   3 month(s)  Provider:   Debbe Odea, MD or Carlos Levering, NP

## 2023-04-07 NOTE — Telephone Encounter (Signed)
ATC x1.  Left detailed vm per DPR.  Sent mychart message as well.

## 2023-04-08 ENCOUNTER — Inpatient Hospital Stay: Payer: Medicare HMO

## 2023-04-08 DIAGNOSIS — C9002 Multiple myeloma in relapse: Secondary | ICD-10-CM

## 2023-04-08 DIAGNOSIS — Z79899 Other long term (current) drug therapy: Secondary | ICD-10-CM | POA: Diagnosis not present

## 2023-04-08 DIAGNOSIS — Z5112 Encounter for antineoplastic immunotherapy: Secondary | ICD-10-CM | POA: Diagnosis not present

## 2023-04-08 LAB — CBC WITH DIFFERENTIAL (CANCER CENTER ONLY)
Abs Immature Granulocytes: 0.08 10*3/uL — ABNORMAL HIGH (ref 0.00–0.07)
Basophils Absolute: 0.1 10*3/uL (ref 0.0–0.1)
Basophils Relative: 1 %
Eosinophils Absolute: 0.3 10*3/uL (ref 0.0–0.5)
Eosinophils Relative: 5 %
HCT: 40.2 % (ref 39.0–52.0)
Hemoglobin: 14 g/dL (ref 13.0–17.0)
Immature Granulocytes: 1 %
Lymphocytes Relative: 20 %
Lymphs Abs: 1.2 10*3/uL (ref 0.7–4.0)
MCH: 32 pg (ref 26.0–34.0)
MCHC: 34.8 g/dL (ref 30.0–36.0)
MCV: 92 fL (ref 80.0–100.0)
Monocytes Absolute: 0.5 10*3/uL (ref 0.1–1.0)
Monocytes Relative: 8 %
Neutro Abs: 4 10*3/uL (ref 1.7–7.7)
Neutrophils Relative %: 65 %
Platelet Count: 196 10*3/uL (ref 150–400)
RBC: 4.37 MIL/uL (ref 4.22–5.81)
RDW: 13.4 % (ref 11.5–15.5)
WBC Count: 6.2 10*3/uL (ref 4.0–10.5)
nRBC: 0 % (ref 0.0–0.2)

## 2023-04-08 LAB — CMP (CANCER CENTER ONLY)
ALT: 25 U/L (ref 0–44)
AST: 31 U/L (ref 15–41)
Albumin: 3.4 g/dL — ABNORMAL LOW (ref 3.5–5.0)
Alkaline Phosphatase: 80 U/L (ref 38–126)
Anion gap: 7 (ref 5–15)
BUN: 8 mg/dL (ref 6–20)
CO2: 25 mmol/L (ref 22–32)
Calcium: 8.8 mg/dL — ABNORMAL LOW (ref 8.9–10.3)
Chloride: 105 mmol/L (ref 98–111)
Creatinine: 1.53 mg/dL — ABNORMAL HIGH (ref 0.61–1.24)
GFR, Estimated: 54 mL/min — ABNORMAL LOW (ref >60)
Glucose, Bld: 138 mg/dL — ABNORMAL HIGH (ref 70–99)
Potassium: 4 mmol/L (ref 3.5–5.1)
Sodium: 137 mmol/L (ref 135–145)
Total Bilirubin: 0.8 mg/dL (ref <1.2)
Total Protein: 5.9 g/dL — ABNORMAL LOW (ref 6.5–8.1)

## 2023-04-08 MED ORDER — HEPARIN SOD (PORK) LOCK FLUSH 100 UNIT/ML IV SOLN
500.0000 [IU] | Freq: Once | INTRAVENOUS | Status: AC | PRN
  Administered 2023-04-08: 500 [IU]
  Filled 2023-04-08: qty 5

## 2023-04-08 MED ORDER — DEXAMETHASONE SODIUM PHOSPHATE 10 MG/ML IJ SOLN
10.0000 mg | Freq: Once | INTRAMUSCULAR | Status: AC
  Administered 2023-04-08: 10 mg via INTRAVENOUS

## 2023-04-08 MED ORDER — DEXTROSE 5 % IV SOLN
20.0000 mg/m2 | Freq: Once | INTRAVENOUS | Status: AC
  Administered 2023-04-08: 36 mg via INTRAVENOUS
  Filled 2023-04-08: qty 15

## 2023-04-08 MED ORDER — SODIUM CHLORIDE 0.9 % IV SOLN
Freq: Once | INTRAVENOUS | Status: AC
  Filled 2023-04-08: qty 250

## 2023-04-08 NOTE — Patient Instructions (Signed)
[facility] - A DEPT OF [facility]  Discharge Instructions: Thank you for choosing [facility] to provide your oncology and hematology care.  If you have a lab appointment with the Cancer Center, please go directly to the Cancer Center and check in at the registration area.  Wear comfortable clothing and clothing appropriate for easy access to any Portacath or PICC line.   We strive to give you quality time with your provider. You may need to reschedule your appointment if you arrive late (15 or more minutes).  Arriving late affects you and other patients whose appointments are after yours.  Also, if you miss three or more appointments without notifying the office, you may be dismissed from the clinic at the provider's discretion.      For prescription refill requests, have your pharmacy contact our office and allow 72 hours for refills to be completed.    Today you received the following chemotherapy and/or immunotherapy agents KYPROLIS      To help prevent nausea and vomiting after your treatment, we encourage you to take your nausea medication as directed.  BELOW ARE SYMPTOMS THAT SHOULD BE REPORTED IMMEDIATELY: *FEVER GREATER THAN 100.4 F (38 C) OR HIGHER *CHILLS OR SWEATING *NAUSEA AND VOMITING THAT IS NOT CONTROLLED WITH YOUR NAUSEA MEDICATION *UNUSUAL SHORTNESS OF BREATH *UNUSUAL BRUISING OR BLEEDING *URINARY PROBLEMS (pain or burning when urinating, or frequent urination) *BOWEL PROBLEMS (unusual diarrhea, constipation, pain near the anus) TENDERNESS IN MOUTH AND THROAT WITH OR WITHOUT PRESENCE OF ULCERS (sore throat, sores in mouth, or a toothache) UNUSUAL RASH, SWELLING OR PAIN  UNUSUAL VAGINAL DISCHARGE OR ITCHING   Items with * indicate a potential emergency and should be followed up as soon as possible or go to the Emergency Department if any problems should occur.  Please show the CHEMOTHERAPY ALERT CARD or IMMUNOTHERAPY  ALERT CARD at check-in to the Emergency Department and triage nurse.  Should you have questions after your visit or need to cancel or reschedule your appointment, please contact [facility] - A DEPT OF [facility]  [phone] and follow the prompts.  Office hours are 8:00 a.m. to 4:30 p.m. Monday - Friday. Please note that voicemails left after 4:00 p.m. may not be returned until the following business day.  We are closed weekends and major holidays. You have access to a nurse at all times for urgent questions. Please call the main number to the clinic [phone] and follow the prompts.  For any non-urgent questions, you may also contact your provider using MyChart. We now offer e-Visits for anyone 36 and older to request care online for non-urgent symptoms. For details visit [email or web address].   Also download the MyChart app! Go to the app store, search "MyChart", open the app, select [place], and log in with your MyChart username and password.   Carfilzomib Injection What is this medication? CARFILZOMIB (kar FILZ oh mib) treats multiple myeloma, a type of bone marrow cancer. It works by blocking a protein that causes cancer cells to grow and multiply. This helps to slow or stop the spread of cancer cells. This medicine may be used for other purposes; ask your health care provider or pharmacist if you have questions. COMMON BRAND NAME(S): KYPROLIS What should I tell my care team before I take this medication? They need to know if you have any of these conditions: Heart disease History of blood clots  Irregular heartbeat Kidney disease Liver disease Lung or breathing disease An unusual or allergic reaction to carfilzomib, or other medications, foods, dyes, or preservatives If you or your partner are pregnant or trying to get pregnant Breastfeeding How should I use this medication? This medication is injected into a vein. It is given by your  care team in a hospital or clinic setting. Talk to your care team about the use of this medication in children. Special care may be needed. Overdosage: If you think you have taken too much of this medicine contact a poison control center or emergency room at once. NOTE: This medicine is only for you. Do not share this medicine with others. What if I miss a dose? Keep appointments for follow-up doses. It is important not to miss your dose. Call your care team if you are unable to keep an appointment. What may interact with this medication? Interactions are not expected. This list may not describe all possible interactions. Give your health care provider a list of all the medicines, herbs, non-prescription drugs, or dietary supplements you use. Also tell them if you smoke, drink alcohol, or use illegal drugs. Some items may interact with your medicine. What should I watch for while using this medication? Your condition will be monitored carefully while you are receiving this medication. You may need blood work while taking this medication. Check with your care team if you have severe diarrhea, nausea, and vomiting, or if you sweat a lot. The loss of too much body fluid may make it dangerous for you to take this medication. This medication may affect your coordination, reaction time, or judgment. Do not drive or operate machinery until you know how this medication affects you. Sit up or stand slowly to reduce the risk of dizzy or fainting spells. Drinking alcohol with this medication can increase the risk of these side effects. Talk to your care team if you may be pregnant. Serious birth defects can occur if you take this medication during pregnancy and for 6 months after the last dose. You will need a negative pregnancy test before starting this medication. Contraception is recommended while taking this medication and for 6 months after the last dose. Your care team can help you find an option that works  for you. If your partner can get pregnant, use a condom during sex while taking this medication and for 3 months after the last dose. Do not breastfeed while taking this medication and for 2 weeks after the last dose. This medication may cause infertility. Talk to your care team if you are concerned about your fertility. What side effects may I notice from receiving this medication? Side effects that you should report to your care team as soon as possible: Allergic reactions--skin rash, itching, hives, swelling of the face, lips, tongue, or throat Bleeding--bloody or black, tar-like stools, vomiting blood or brown material that looks like coffee grounds, red or dark brown urine, small red or purple spots on skin, unusual bruising or bleeding Blood clot--pain, swelling, or warmth in the leg, shortness of breath, chest pain Dizziness, loss of balance or coordination, confusion or trouble speaking Heart attack--pain or tightness in the chest, shoulders, arms, or jaw, nausea, shortness of breath, cold or clammy skin, feeling faint or lightheaded Heart failure--shortness of breath, swelling of the ankles, feet, or hands, sudden weight gain, unusual weakness or fatigue Heart rhythm changes--fast or irregular heartbeat, dizziness, feeling faint or lightheaded, chest pain, trouble breathing Increase in blood pressure Infection--fever, chills,  cough, sore throat, wounds that don't heal, pain or trouble when passing urine, general feeling of discomfort or being unwell Infusion reactions--chest pain, shortness of breath or trouble breathing, feeling faint or lightheaded Kidney injury--decrease in the amount of urine, swelling of the ankles, hands, or feet Liver injury--right upper belly pain, loss of appetite, nausea, light-colored stool, dark yellow or brown urine, yellowing skin or eyes, unusual weakness or fatigue Lung injury--shortness of breath or trouble breathing, cough, spitting up blood, chest pain,  fever Pulmonary hypertension--shortness of breath, chest pain, fast or irregular heartbeat, feeling faint or lightheaded, fatigue, swelling of the ankles or feet Stomach pain, bloody diarrhea, pale skin, unusual weakness or fatigue, decrease in the amount of urine, which may be signs of hemolytic uremic syndrome Sudden and severe headache, confusion, change in vision, seizures, which may be signs of posterior reversible encephalopathy syndrome (PRES) TTP--purple spots on the skin or inside the mouth, pale skin, yellowing skin or eyes, unusual weakness or fatigue, fever, fast or irregular heartbeat, confusion, change in vision, trouble speaking, trouble walking Tumor lysis syndrome (TLS)--nausea, vomiting, diarrhea, decrease in the amount of urine, dark urine, unusual weakness or fatigue, confusion, muscle pain or cramps, fast or irregular heartbeat, joint pain Side effects that usually do not require medical attention (report to your care team if they continue or are bothersome): Diarrhea Fatigue Nausea Trouble sleeping This list may not describe all possible side effects. Call your doctor for medical advice about side effects. You may report side effects to FDA at 1-800-FDA-1088. Where should I keep my medication? This medication is given in a hospital or clinic. It will not be stored at home. NOTE: This sheet is a summary. It may not cover all possible information. If you have questions about this medicine, talk to your doctor, pharmacist, or health care provider.  2024 Elsevier/Gold Standard (2021-09-06 00:00:00)

## 2023-04-14 NOTE — Telephone Encounter (Signed)
Called and spoke with patient, provided patient with recommendations per Rubye Oaks NP.  He verbalized understanding.  He states that his CPAP titration study is scheduled in [place] on 05/06/22 at 8 pm.  Nothing further needed.

## 2023-04-21 MED FILL — Dexamethasone Sodium Phosphate Inj 100 MG/10ML: INTRAMUSCULAR | Qty: 2 | Status: AC

## 2023-04-22 ENCOUNTER — Encounter: Payer: Self-pay | Admitting: Nurse Practitioner

## 2023-04-22 ENCOUNTER — Inpatient Hospital Stay (HOSPITAL_BASED_OUTPATIENT_CLINIC_OR_DEPARTMENT_OTHER): Payer: Medicare HMO | Admitting: Nurse Practitioner

## 2023-04-22 ENCOUNTER — Inpatient Hospital Stay: Payer: Medicare HMO

## 2023-04-22 VITALS — BP 123/96 | HR 94 | Temp 97.3°F | Wt 178.0 lb

## 2023-04-22 DIAGNOSIS — C9002 Multiple myeloma in relapse: Secondary | ICD-10-CM | POA: Diagnosis not present

## 2023-04-22 DIAGNOSIS — Z79899 Other long term (current) drug therapy: Secondary | ICD-10-CM | POA: Diagnosis not present

## 2023-04-22 DIAGNOSIS — Z5111 Encounter for antineoplastic chemotherapy: Secondary | ICD-10-CM

## 2023-04-22 DIAGNOSIS — Z5112 Encounter for antineoplastic immunotherapy: Secondary | ICD-10-CM | POA: Diagnosis not present

## 2023-04-22 LAB — CMP (CANCER CENTER ONLY)
ALT: 24 U/L (ref 0–44)
AST: 32 U/L (ref 15–41)
Albumin: 3.3 g/dL — ABNORMAL LOW (ref 3.5–5.0)
Alkaline Phosphatase: 69 U/L (ref 38–126)
Anion gap: 8 (ref 5–15)
BUN: 7 mg/dL (ref 6–20)
CO2: 24 mmol/L (ref 22–32)
Calcium: 8.6 mg/dL — ABNORMAL LOW (ref 8.9–10.3)
Chloride: 104 mmol/L (ref 98–111)
Creatinine: 1.43 mg/dL — ABNORMAL HIGH (ref 0.61–1.24)
GFR, Estimated: 59 mL/min — ABNORMAL LOW (ref >60)
Glucose, Bld: 149 mg/dL — ABNORMAL HIGH (ref 70–99)
Potassium: 3.1 mmol/L — ABNORMAL LOW (ref 3.5–5.1)
Sodium: 136 mmol/L (ref 135–145)
Total Bilirubin: 1.1 mg/dL (ref 0.0–1.2)
Total Protein: 5.7 g/dL — ABNORMAL LOW (ref 6.5–8.1)

## 2023-04-22 LAB — CBC WITH DIFFERENTIAL (CANCER CENTER ONLY)
Abs Immature Granulocytes: 0.02 10*3/uL (ref 0.00–0.07)
Basophils Absolute: 0 10*3/uL (ref 0.0–0.1)
Basophils Relative: 1 %
Eosinophils Absolute: 0.3 10*3/uL (ref 0.0–0.5)
Eosinophils Relative: 7 %
HCT: 38.3 % — ABNORMAL LOW (ref 39.0–52.0)
Hemoglobin: 13.3 g/dL (ref 13.0–17.0)
Immature Granulocytes: 1 %
Lymphocytes Relative: 25 %
Lymphs Abs: 1.1 10*3/uL (ref 0.7–4.0)
MCH: 31.8 pg (ref 26.0–34.0)
MCHC: 34.7 g/dL (ref 30.0–36.0)
MCV: 91.6 fL (ref 80.0–100.0)
Monocytes Absolute: 0.3 10*3/uL (ref 0.1–1.0)
Monocytes Relative: 7 %
Neutro Abs: 2.6 10*3/uL (ref 1.7–7.7)
Neutrophils Relative %: 59 %
Platelet Count: 173 10*3/uL (ref 150–400)
RBC: 4.18 MIL/uL — ABNORMAL LOW (ref 4.22–5.81)
RDW: 13.5 % (ref 11.5–15.5)
WBC Count: 4.3 10*3/uL (ref 4.0–10.5)
nRBC: 0 % (ref 0.0–0.2)

## 2023-04-22 MED ORDER — HEPARIN SOD (PORK) LOCK FLUSH 100 UNIT/ML IV SOLN
500.0000 [IU] | Freq: Once | INTRAVENOUS | Status: AC | PRN
  Administered 2023-04-22: 500 [IU]
  Filled 2023-04-22: qty 5

## 2023-04-22 MED ORDER — DEXTROSE 5 % IV SOLN
20.0000 mg/m2 | Freq: Once | INTRAVENOUS | Status: AC
  Administered 2023-04-22: 36 mg via INTRAVENOUS
  Filled 2023-04-22: qty 15

## 2023-04-22 MED ORDER — DEXAMETHASONE SODIUM PHOSPHATE 100 MG/10ML IJ SOLN
20.0000 mg | Freq: Once | INTRAMUSCULAR | Status: AC
  Administered 2023-04-22: 20 mg via INTRAVENOUS
  Filled 2023-04-22: qty 20

## 2023-04-22 MED ORDER — SODIUM CHLORIDE 0.9 % IV SOLN
Freq: Once | INTRAVENOUS | Status: AC
  Filled 2023-04-22: qty 250

## 2023-04-22 NOTE — Patient Instructions (Signed)
 [facility] - A DEPT OF [facility]  Discharge Instructions: Thank you for choosing [facility] to provide your oncology and hematology care.  If you have a lab appointment with the Cancer Center, please go directly to the Cancer Center and check in at the registration area.  Wear comfortable clothing and clothing appropriate for easy access to any Portacath or PICC line.   We strive to give you quality time with your provider. You may need to reschedule your appointment if you arrive late (15 or more minutes).  Arriving late affects you and other patients whose appointments are after yours.  Also, if you miss three or more appointments without notifying the office, you may be dismissed from the clinic at the provider's discretion.      For prescription refill requests, have your pharmacy contact our office and allow 72 hours for refills to be completed.    Today you received the following chemotherapy and/or immunotherapy agents KYPROLIS      To help prevent nausea and vomiting after your treatment, we encourage you to take your nausea medication as directed.  BELOW ARE SYMPTOMS THAT SHOULD BE REPORTED IMMEDIATELY: *FEVER GREATER THAN 100.4 F (38 C) OR HIGHER *CHILLS OR SWEATING *NAUSEA AND VOMITING THAT IS NOT CONTROLLED WITH YOUR NAUSEA MEDICATION *UNUSUAL SHORTNESS OF BREATH *UNUSUAL BRUISING OR BLEEDING *URINARY PROBLEMS (pain or burning when urinating, or frequent urination) *BOWEL PROBLEMS (unusual diarrhea, constipation, pain near the anus) TENDERNESS IN MOUTH AND THROAT WITH OR WITHOUT PRESENCE OF ULCERS (sore throat, sores in mouth, or a toothache) UNUSUAL RASH, SWELLING OR PAIN  UNUSUAL VAGINAL DISCHARGE OR ITCHING   Items with * indicate a potential emergency and should be followed up as soon as possible or go to the Emergency Department if any problems should occur.  Please show the CHEMOTHERAPY ALERT CARD or IMMUNOTHERAPY  ALERT CARD at check-in to the Emergency Department and triage nurse.  Should you have questions after your visit or need to cancel or reschedule your appointment, please contact [facility] - A DEPT OF [facility]  [phone] and follow the prompts.  Office hours are 8:00 a.m. to 4:30 p.m. Monday - Friday. Please note that voicemails left after 4:00 p.m. may not be returned until the following business day.  We are closed weekends and major holidays. You have access to a nurse at all times for urgent questions. Please call the main number to the clinic [phone] and follow the prompts.  For any non-urgent questions, you may also contact your provider using MyChart. We now offer e-Visits for anyone 36 and older to request care online for non-urgent symptoms. For details visit [email or web address].   Also download the MyChart app! Go to the app store, search "MyChart", open the app, select [place], and log in with your MyChart username and password.   Carfilzomib Injection What is this medication? CARFILZOMIB (kar FILZ oh mib) treats multiple myeloma, a type of bone marrow cancer. It works by blocking a protein that causes cancer cells to grow and multiply. This helps to slow or stop the spread of cancer cells. This medicine may be used for other purposes; ask your health care provider or pharmacist if you have questions. COMMON BRAND NAME(S): KYPROLIS What should I tell my care team before I take this medication? They need to know if you have any of these conditions: Heart disease History of blood clots  Irregular heartbeat Kidney disease Liver disease Lung or breathing disease An unusual or allergic reaction to carfilzomib, or other medications, foods, dyes, or preservatives If you or your partner are pregnant or trying to get pregnant Breastfeeding How should I use this medication? This medication is injected into a vein. It is given by your  care team in a hospital or clinic setting. Talk to your care team about the use of this medication in children. Special care may be needed. Overdosage: If you think you have taken too much of this medicine contact a poison control center or emergency room at once. NOTE: This medicine is only for you. Do not share this medicine with others. What if I miss a dose? Keep appointments for follow-up doses. It is important not to miss your dose. Call your care team if you are unable to keep an appointment. What may interact with this medication? Interactions are not expected. This list may not describe all possible interactions. Give your health care provider a list of all the medicines, herbs, non-prescription drugs, or dietary supplements you use. Also tell them if you smoke, drink alcohol, or use illegal drugs. Some items may interact with your medicine. What should I watch for while using this medication? Your condition will be monitored carefully while you are receiving this medication. You may need blood work while taking this medication. Check with your care team if you have severe diarrhea, nausea, and vomiting, or if you sweat a lot. The loss of too much body fluid may make it dangerous for you to take this medication. This medication may affect your coordination, reaction time, or judgment. Do not drive or operate machinery until you know how this medication affects you. Sit up or stand slowly to reduce the risk of dizzy or fainting spells. Drinking alcohol with this medication can increase the risk of these side effects. Talk to your care team if you may be pregnant. Serious birth defects can occur if you take this medication during pregnancy and for 6 months after the last dose. You will need a negative pregnancy test before starting this medication. Contraception is recommended while taking this medication and for 6 months after the last dose. Your care team can help you find an option that works  for you. If your partner can get pregnant, use a condom during sex while taking this medication and for 3 months after the last dose. Do not breastfeed while taking this medication and for 2 weeks after the last dose. This medication may cause infertility. Talk to your care team if you are concerned about your fertility. What side effects may I notice from receiving this medication? Side effects that you should report to your care team as soon as possible: Allergic reactions--skin rash, itching, hives, swelling of the face, lips, tongue, or throat Bleeding--bloody or black, tar-like stools, vomiting blood or brown material that looks like coffee grounds, red or dark brown urine, small red or purple spots on skin, unusual bruising or bleeding Blood clot--pain, swelling, or warmth in the leg, shortness of breath, chest pain Dizziness, loss of balance or coordination, confusion or trouble speaking Heart attack--pain or tightness in the chest, shoulders, arms, or jaw, nausea, shortness of breath, cold or clammy skin, feeling faint or lightheaded Heart failure--shortness of breath, swelling of the ankles, feet, or hands, sudden weight gain, unusual weakness or fatigue Heart rhythm changes--fast or irregular heartbeat, dizziness, feeling faint or lightheaded, chest pain, trouble breathing Increase in blood pressure Infection--fever, chills,  cough, sore throat, wounds that don't heal, pain or trouble when passing urine, general feeling of discomfort or being unwell Infusion reactions--chest pain, shortness of breath or trouble breathing, feeling faint or lightheaded Kidney injury--decrease in the amount of urine, swelling of the ankles, hands, or feet Liver injury--right upper belly pain, loss of appetite, nausea, light-colored stool, dark yellow or brown urine, yellowing skin or eyes, unusual weakness or fatigue Lung injury--shortness of breath or trouble breathing, cough, spitting up blood, chest pain,  fever Pulmonary hypertension--shortness of breath, chest pain, fast or irregular heartbeat, feeling faint or lightheaded, fatigue, swelling of the ankles or feet Stomach pain, bloody diarrhea, pale skin, unusual weakness or fatigue, decrease in the amount of urine, which may be signs of hemolytic uremic syndrome Sudden and severe headache, confusion, change in vision, seizures, which may be signs of posterior reversible encephalopathy syndrome (PRES) TTP--purple spots on the skin or inside the mouth, pale skin, yellowing skin or eyes, unusual weakness or fatigue, fever, fast or irregular heartbeat, confusion, change in vision, trouble speaking, trouble walking Tumor lysis syndrome (TLS)--nausea, vomiting, diarrhea, decrease in the amount of urine, dark urine, unusual weakness or fatigue, confusion, muscle pain or cramps, fast or irregular heartbeat, joint pain Side effects that usually do not require medical attention (report to your care team if they continue or are bothersome): Diarrhea Fatigue Nausea Trouble sleeping This list may not describe all possible side effects. Call your doctor for medical advice about side effects. You may report side effects to FDA at 1-800-FDA-1088. Where should I keep my medication? This medication is given in a hospital or clinic. It will not be stored at home. NOTE: This sheet is a summary. It may not cover all possible information. If you have questions about this medicine, talk to your doctor, pharmacist, or health care provider.  2024 Elsevier/Gold Standard (2021-09-06 00:00:00)

## 2023-04-22 NOTE — Progress Notes (Signed)
 Hematology/Oncology Consult Note [facility]  [phone] [phone]  Patient Care Team: Gretta Comer POUR, NP as PCP - General (Internal Medicine) Rayfield Boroughs, MD as PCP - Hematology/Oncology Darliss Rogue, MD as PCP - Cardiology (Cardiology) Melanee Annah BROCKS, MD as Consulting Physician (Hematology and Oncology)   Name of the patient: James House  [id number]  02/19/1971   Date of visit: 04/22/23  Diagnosis-recurrent kappa light chain multiple myeloma in relapse  Chief complaint/ Reason for visit-on treatment assessment prior to cycle 10-day 1 of carfilzomib   Heme/Onc history: Patient is a 52- year-old male with a history of kappa light chain myeloma that was treated by Dr. Rayfield Hails.  History is as follows:   1.  Patient presented with renal insufficiency with a creatinine of 2.4 in April 2018.  Kidney biopsy showed myeloma cast nephropathy.  Kappa light chain was 3004 and lambda 0.22 with a kappa lambda ratio of 13,000 655.  Skeletal survey showed multiple calvarial and marrow lesions.  Bone marrow biopsy in May 2018 showed 43% plasma cells.  He received CyBorD for cycle 1 in May 2018 and was subsequently switched to RVD.  He received 4 cycles followed by a repeat bone marrow biopsy in August 2018 which showed no increase in plasma cells.   2.  He underwent autologous stem cell transplantation on 01/30/2017.  He was started on Revlimid  maintenance 10 mg daily in January 2019.   3.  Labs from January February and March 2019 done at [facility] showed no M spike, normal kappa lambda ratio of 1.06.  He was last seen by them in April 2019.   4. Following that patient was admitted to [facility] for healthcare associated pneumonia and was recently discharged.  He wished to transfer his care to us  at this time.  He was on maintenance Revlimid  5 mg which was then reduced to 2.5 mg.  Follows up with pain clinic for chemo-induced peripheral neuropathy with Dr.  Tanya.  His urine tested positive for [facility] and therefore patient was discharged from pain clinic   5.  Noted to have gradually increasing free kappa light chainFrom 15 in 20 21-39.5 with a ratio of 3.09.  He was seen by [facility] again and underwent a repeat bone marrow biopsy which did not show any evidence of clonal plasma cells.  PET CT scan also did not show any new active lytic lesions in May 2023..  Revlimid  dose was increased to 15 mg 3 weeks on 1 week off but kappa light chains show continuing to increase   6.  PET CT scan in December 2023 did not show any evidence of active myeloma.  Patient underwent repeat bone marrow biopsy at [facility] which showed 6% plasma cellsConsistent with persistent plasma cell neoplasm.  74 to 86% of isolated plasma cells show loss of T p53 and monosomy 13.  Patient was seen by [name] at [facility] and was recommended Darzalex  Pomalyst  dexamethasone  regimen   7.  Disease progression after 8 weekly cycles of Darazalex kappa light chain increased from 332-646 with a ratio that went up from 39-57 concerning for disease progression.  Patient switched to carfilzomib  Pomalyst  dexamethasone  regimen    Interval history- overall doing the same. Pain is unchanged. Continues to have fatigue and appetite ups and downs but unchanged. No new pain. Blood pressure is better controlled at home. No recent infections or fevers. Endorses generalized aches which are unchanged.   ECOG PS- 2 Pain scale- 8 Opioid associated constipation-  no  Review of systems- Review of Systems  Constitutional:  Positive for malaise/fatigue. Negative for chills, fever and weight loss.  HENT:  Negative for congestion, ear discharge and nosebleeds.   Eyes:  Negative for blurred vision.  Respiratory:  Negative for cough, hemoptysis, sputum production, shortness of breath and wheezing.   Cardiovascular:  Negative for chest pain, palpitations, orthopnea and claudication.  Gastrointestinal:  Negative for abdominal  pain, blood in stool, constipation, diarrhea, heartburn, melena, nausea and vomiting.  Genitourinary:  Negative for dysuria, flank pain, frequency, hematuria and urgency.  Musculoskeletal:  Positive for myalgias. Negative for back pain, falls and joint pain.  Skin:  Negative for rash.  Neurological:  Negative for dizziness, tingling, focal weakness, seizures, weakness and headaches.  Endo/Heme/Allergies:  Does not bruise/bleed easily.  Psychiatric/Behavioral:  Negative for depression and suicidal ideas. The patient does not have insomnia.      Allergies  Allergen Reactions   Morphine  Nausea And Vomiting    Past Medical History:  Diagnosis Date   CKD (chronic kidney disease) stage 3, GFR 30-59 ml/min (HCC)    Elevated sedimentation rate 12/24/2017   Hypertension    Multiple myeloma (HCC) 08/11/2017   Neuropathy    Pneumonia    Severe sepsis (HCC) 08/11/2017   Shingles 08/03/2019    Past Surgical History:  Procedure Laterality Date   ABDOMINAL SURGERY     COLONOSCOPY WITH PROPOFOL  N/A 05/25/2021   Procedure: COLONOSCOPY WITH PROPOFOL ;  Surgeon: Unk Corinn Skiff, MD;  Location: [facility] ENDOSCOPY;  Service: Gastroenterology;  Laterality: N/A;   IR IMAGING GUIDED PORT INSERTION  08/02/2022   JOINT REPLACEMENT     right knee   KNEE SURGERY Left     Social History   Socioeconomic History   Marital status: Single    Spouse name: Jon   Number of children: 3   Years of education: Not on file   Highest education level: Not on file  Occupational History   Not on file  Tobacco Use   Smoking status: Former   Smokeless tobacco: Current    Types: Snuff  Vaping Use   Vaping status: Never Used  Substance and Sexual Activity   Alcohol use: No   Drug use: No   Sexual activity: Yes  Other Topics Concern   Not on file  Social History Narrative   Live in private residence with spouse and mother in law   Social Drivers of Health   Financial Resource Strain: Low Risk   (08/12/2017)   Overall Financial Resource Strain (CARDIA)    Difficulty of Paying Living Expenses: Not hard at all  Food Insecurity: No Food Insecurity (08/12/2017)   Hunger Vital Sign    Worried About Running Out of Food in the Last Year: Never true    Ran Out of Food in the Last Year: Never true  Transportation Needs: No Transportation Needs (08/12/2017)   PRAPARE - Administrator, Civil Service (Medical): No    Lack of Transportation (Non-Medical): No  Physical Activity: Sufficiently Active (08/12/2017)   Exercise Vital Sign    Days of Exercise per Week: 7 days    Minutes of Exercise per Session: 30 min  Stress: No Stress Concern Present (08/12/2017)   [employer] of Occupational Health - Occupational Stress Questionnaire    Feeling of Stress : Only a little  Social Connections: Somewhat Isolated (08/12/2017)   Social Connection and Isolation Panel [NHANES]    Frequency of Communication with Friends and Family: Once  a week    Frequency of Social Gatherings with Friends and Family: Once a week    Attends Religious Services: More than 4 times per year    Active Member of [employer] or Organizations: No    Attends Banker Meetings: Never    Marital Status: Married  Catering Manager Violence: Not At Risk (08/12/2017)   Humiliation, Afraid, Rape, and Kick questionnaire    Fear of Current or Ex-Partner: No    Emotionally Abused: No    Physically Abused: No    Sexually Abused: No    Family History  Problem Relation Age of Onset   Heart disease Mother    Cancer Mother 31       Pancreatic   COPD Mother    Diabetes Mother    Hyperlipidemia Mother    Hypertension Mother    Cancer Maternal Uncle        pancreatic   Heart disease Maternal Grandmother    Cancer Maternal Grandmother 6       colon   COPD Maternal Grandmother    Diabetes Maternal Grandmother    Hypertension Maternal Grandmother      Current Outpatient Medications:    acyclovir  (ZOVIRAX ) 400  MG tablet, Take 1 tablet (400 mg total) by mouth 2 (two) times daily., Disp: 60 tablet, Rfl: 2   albuterol  (VENTOLIN  HFA) 108 (90 Base) MCG/ACT inhaler, Inhale 2 puffs into the lungs every 6 (six) hours as needed for wheezing or shortness of breath., Disp: 6.7 g, Rfl: 2   amitriptyline  (ELAVIL ) 25 MG tablet, Take 1 tablet (25 mg total) by mouth at bedtime. For headache prevention and sleep, Disp: 90 tablet, Rfl: 1   amLODipine -benazepril  (LOTREL ) 10-40 MG capsule, Take 1 capsule by mouth daily. for blood pressure., Disp: 90 capsule, Rfl: 1   aspirin  EC 81 MG tablet, Take by mouth., Disp: , Rfl:    baclofen  (LIORESAL ) 10 MG tablet, Take 1 tablet (10 mg total) by mouth 4 (four) times daily as needed for muscle spasms., Disp: 360 tablet, Rfl: 1   carvedilol  (COREG ) 12.5 MG tablet, Take 1 tablet (12.5 mg total) by mouth 2 (two) times daily., Disp: 180 tablet, Rfl: 1   celecoxib  (CELEBREX ) 200 MG capsule, Take 1 capsule (200 mg total) by mouth daily., Disp: 30 capsule, Rfl: 2   cyanocobalamin  (VITAMIN B12) 1000 MCG/ML injection, Inject 1 mL (1,000 mcg total) into the muscle every 30 (thirty) days., Disp: 1 mL, Rfl: 11   levofloxacin  (LEVAQUIN ) 750 MG tablet, Take 1 tablet (750 mg total) by mouth every other day., Disp: 5 tablet, Rfl: 0   levothyroxine  (SYNTHROID ) 50 MCG tablet, Take 1 tablet (50 mcg total) by mouth daily before breakfast., Disp: 30 tablet, Rfl: 2   lidocaine -prilocaine  (EMLA ) cream, Apply 1 Application topically as needed., Disp: 30 g, Rfl: 0   montelukast  (SINGULAIR ) 10 MG tablet, Take 1 tablet in the evening 1 day before your treatment. Day of treatment take 1 tablet in the morning for the first 2 treatments of Daratumumab , Disp: 4 tablet, Rfl: 0   OLANZapine  (ZYPREXA ) 10 MG tablet, Take 1 tablet (10 mg total) by mouth at bedtime. To help with nausea prevention., Disp: 30 tablet, Rfl: 1   oxyCODONE  (ROXICODONE ) 15 MG immediate release tablet, Take 1 tablet (15 mg total) by mouth every 8  (eight) hours as needed for pain., Disp: 90 tablet, Rfl: 0   oxyCODONE  ER (XTAMPZA  ER) 9 MG C12A, Take 9 mg by mouth every 12 (twelve)  hours., Disp: 60 capsule, Rfl: 0   pomalidomide  (POMALYST ) 2 MG capsule, Take 1 capsule (2 mg total) by mouth daily. AND 7 DAYS OFF.  CELGENE-REM : [id number]   DONE ON 01/17/2023, Disp: 21 capsule, Rfl: 0   potassium chloride  SA (KLOR-CON  M) 20 MEQ tablet, Take 2 tablets (40 mEq total) by mouth 2 (two) times daily., Disp: 120 tablet, Rfl: 0   pregabalin  (LYRICA ) 300 MG capsule, Take 1 capsule (300 mg total) by mouth 2 (two) times daily., Disp: 180 capsule, Rfl: 0   prochlorperazine  (COMPAZINE ) 10 MG tablet, Take by mouth every 8 (eight) hours as needed., Disp: , Rfl:    DULoxetine  (CYMBALTA ) 60 MG capsule, Take 1 capsule (60 mg total) by mouth daily., Disp: 60 capsule, Rfl: 1  Physical exam:  Vitals:   04/22/23 0857  BP: (!) 123/96  Pulse: 94  Temp: (!) 97.3 F (36.3 C)  TempSrc: Tympanic  SpO2: 99%  Weight: 178 lb (80.7 kg)   Physical Exam Vitals reviewed.  Constitutional:      Appearance: He is not ill-appearing.  Cardiovascular:     Rate and Rhythm: Normal rate and regular rhythm.  Pulmonary:     Effort: Pulmonary effort is normal.     Breath sounds: Normal breath sounds.  Abdominal:     General: There is no distension.  Skin:    General: Skin is warm and dry.  Neurological:     Mental Status: He is alert and oriented to person, place, and time.  Psychiatric:        Mood and Affect: Mood normal.        Behavior: Behavior normal.         Latest Ref Rng & Units 04/22/2023    8:41 AM  CMP  Glucose 70 - 99 mg/dL 850   BUN 6 - 20 mg/dL 7   Creatinine 9.38 - 8.75 mg/dL 8.56   Sodium 864 - 854 mmol/L 136   Potassium 3.5 - 5.1 mmol/L 3.1   Chloride 98 - 111 mmol/L 104   CO2 22 - 32 mmol/L 24   Calcium 8.9 - 10.3 mg/dL 8.6   Total Protein 6.5 - 8.1 g/dL 5.7   Total Bilirubin 0.0 - 1.2 mg/dL 1.1   Alkaline Phos 38 - 126 U/L 69   AST 15 -  41 U/L 32   ALT 0 - 44 U/L 24       Latest Ref Rng & Units 04/22/2023    8:41 AM  CBC  WBC 4.0 - 10.5 K/uL 4.3   Hemoglobin 13.0 - 17.0 g/dL 86.6   Hematocrit 60.9 - 52.0 % 38.3   Platelets 150 - 400 K/uL 173     Assessment and plan- Patient is a 52 y.o. male with   Encounter for chemotherapy-  here for on treatment assessment prior to cycle 10 day 1 of carfilzomib  and Pomalyst . Labs reviewed and acceptable for treatment. He will proceed directly to C10D8 next week. He will continue pomalyst  3 weeks on, 1 week off. Plan for him to see Dr Melanee back in 2 weeks for labs, consideration of C10D15.  history of kappa light chain multiple myeloma in relapse. Serum free light chain ratio has remained stable. Plan to recheck with c11d1.  Prophylaxis- continue aspirin  and acyclovir  Chronic back pain- d/t peripheral neuropathy. Controlled with as needed oxycodone .   Disposition:  Treatment today and follow up per IS- la   Visit Diagnosis 1. Encounter for antineoplastic chemotherapy   2.  Multiple myeloma in relapse (HCC)    Tinnie Dawn, DNP, AGNP-C, AOCNP [facility] [phone] (clinic) 04/22/2023

## 2023-04-24 ENCOUNTER — Encounter: Payer: Self-pay | Admitting: Oncology

## 2023-04-28 DIAGNOSIS — H5203 Hypermetropia, bilateral: Secondary | ICD-10-CM | POA: Diagnosis not present

## 2023-04-28 DIAGNOSIS — Z0100 Encounter for examination of eyes and vision without abnormal findings: Secondary | ICD-10-CM | POA: Diagnosis not present

## 2023-04-28 MED FILL — Dexamethasone Sodium Phosphate Inj 100 MG/10ML: INTRAMUSCULAR | Qty: 2 | Status: AC

## 2023-04-29 ENCOUNTER — Other Ambulatory Visit: Payer: Medicare HMO

## 2023-04-29 ENCOUNTER — Ambulatory Visit: Payer: Medicare HMO | Attending: Otolaryngology

## 2023-04-29 ENCOUNTER — Inpatient Hospital Stay: Payer: Medicare HMO | Attending: Internal Medicine

## 2023-04-29 ENCOUNTER — Ambulatory Visit: Payer: Medicare HMO

## 2023-04-29 ENCOUNTER — Inpatient Hospital Stay: Payer: Medicare HMO

## 2023-04-29 VITALS — BP 146/91 | HR 74 | Temp 98.2°F | Resp 17

## 2023-04-29 DIAGNOSIS — G4737 Central sleep apnea in conditions classified elsewhere: Secondary | ICD-10-CM | POA: Diagnosis not present

## 2023-04-29 DIAGNOSIS — Z79899 Other long term (current) drug therapy: Secondary | ICD-10-CM | POA: Diagnosis not present

## 2023-04-29 DIAGNOSIS — F11982 Opioid use, unspecified with opioid-induced sleep disorder: Secondary | ICD-10-CM | POA: Diagnosis not present

## 2023-04-29 DIAGNOSIS — Z5112 Encounter for antineoplastic immunotherapy: Secondary | ICD-10-CM | POA: Insufficient documentation

## 2023-04-29 DIAGNOSIS — C9002 Multiple myeloma in relapse: Secondary | ICD-10-CM | POA: Insufficient documentation

## 2023-04-29 DIAGNOSIS — G4733 Obstructive sleep apnea (adult) (pediatric): Secondary | ICD-10-CM | POA: Diagnosis present

## 2023-04-29 LAB — CBC WITH DIFFERENTIAL (CANCER CENTER ONLY)
Abs Immature Granulocytes: 0.04 10*3/uL (ref 0.00–0.07)
Basophils Absolute: 0 10*3/uL (ref 0.0–0.1)
Basophils Relative: 1 %
Eosinophils Absolute: 0.3 10*3/uL (ref 0.0–0.5)
Eosinophils Relative: 7 %
HCT: 39.8 % (ref 39.0–52.0)
Hemoglobin: 13.8 g/dL (ref 13.0–17.0)
Immature Granulocytes: 1 %
Lymphocytes Relative: 31 %
Lymphs Abs: 1.5 10*3/uL (ref 0.7–4.0)
MCH: 31.7 pg (ref 26.0–34.0)
MCHC: 34.7 g/dL (ref 30.0–36.0)
MCV: 91.3 fL (ref 80.0–100.0)
Monocytes Absolute: 0.3 10*3/uL (ref 0.1–1.0)
Monocytes Relative: 7 %
Neutro Abs: 2.6 10*3/uL (ref 1.7–7.7)
Neutrophils Relative %: 53 %
Platelet Count: 193 10*3/uL (ref 150–400)
RBC: 4.36 MIL/uL (ref 4.22–5.81)
RDW: 13.4 % (ref 11.5–15.5)
WBC Count: 4.9 10*3/uL (ref 4.0–10.5)
nRBC: 0 % (ref 0.0–0.2)

## 2023-04-29 LAB — CMP (CANCER CENTER ONLY)
ALT: 18 U/L (ref 0–44)
AST: 22 U/L (ref 15–41)
Albumin: 3.7 g/dL (ref 3.5–5.0)
Alkaline Phosphatase: 73 U/L (ref 38–126)
Anion gap: 9 (ref 5–15)
BUN: 11 mg/dL (ref 6–20)
CO2: 23 mmol/L (ref 22–32)
Calcium: 8.8 mg/dL — ABNORMAL LOW (ref 8.9–10.3)
Chloride: 107 mmol/L (ref 98–111)
Creatinine: 1.3 mg/dL — ABNORMAL HIGH (ref 0.61–1.24)
GFR, Estimated: >60 mL/min (ref >60)
Glucose, Bld: 127 mg/dL — ABNORMAL HIGH (ref 70–99)
Potassium: 3.8 mmol/L (ref 3.5–5.1)
Sodium: 139 mmol/L (ref 135–145)
Total Bilirubin: 0.9 mg/dL (ref 0.0–1.2)
Total Protein: 5.9 g/dL — ABNORMAL LOW (ref 6.5–8.1)

## 2023-04-29 MED ORDER — SODIUM CHLORIDE 0.9 % IV SOLN
Freq: Once | INTRAVENOUS | Status: AC
  Filled 2023-04-29: qty 250

## 2023-04-29 MED ORDER — DEXTROSE 5 % IV SOLN
20.0000 mg/m2 | Freq: Once | INTRAVENOUS | Status: AC
  Administered 2023-04-29: 36 mg via INTRAVENOUS
  Filled 2023-04-29: qty 15

## 2023-04-29 MED ORDER — DEXAMETHASONE SODIUM PHOSPHATE 100 MG/10ML IJ SOLN
20.0000 mg | Freq: Once | INTRAMUSCULAR | Status: AC
  Administered 2023-04-29: 20 mg via INTRAVENOUS
  Filled 2023-04-29: qty 20

## 2023-04-29 MED ORDER — HEPARIN SOD (PORK) LOCK FLUSH 100 UNIT/ML IV SOLN
500.0000 [IU] | Freq: Once | INTRAVENOUS | Status: AC | PRN
  Administered 2023-04-29: 500 [IU]
  Filled 2023-04-29: qty 5

## 2023-04-29 NOTE — Patient Instructions (Signed)

## 2023-04-29 NOTE — Patient Instructions (Signed)
 [facility] - A DEPT OF [facility]  Discharge Instructions: Thank you for choosing [facility] to provide your oncology and hematology care.  If you have a lab appointment with the Cancer Center, please go directly to the Cancer Center and check in at the registration area.  Wear comfortable clothing and clothing appropriate for easy access to any Portacath or PICC line.   We strive to give you quality time with your provider. You may need to reschedule your appointment if you arrive late (15 or more minutes).  Arriving late affects you and other patients whose appointments are after yours.  Also, if you miss three or more appointments without notifying the office, you may be dismissed from the clinic at the provider's discretion.      For prescription refill requests, have your pharmacy contact our office and allow 72 hours for refills to be completed.    Today you received the following chemotherapy and/or immunotherapy agents: carfilzomib    To help prevent nausea and vomiting after your treatment, we encourage you to take your nausea medication as directed.  BELOW ARE SYMPTOMS THAT SHOULD BE REPORTED IMMEDIATELY: *FEVER GREATER THAN 100.4 F (38 C) OR HIGHER *CHILLS OR SWEATING *NAUSEA AND VOMITING THAT IS NOT CONTROLLED WITH YOUR NAUSEA MEDICATION *UNUSUAL SHORTNESS OF BREATH *UNUSUAL BRUISING OR BLEEDING *URINARY PROBLEMS (pain or burning when urinating, or frequent urination) *BOWEL PROBLEMS (unusual diarrhea, constipation, pain near the anus) TENDERNESS IN MOUTH AND THROAT WITH OR WITHOUT PRESENCE OF ULCERS (sore throat, sores in mouth, or a toothache) UNUSUAL RASH, SWELLING OR PAIN  UNUSUAL VAGINAL DISCHARGE OR ITCHING   Items with * indicate a potential emergency and should be followed up as soon as possible or go to the Emergency Department if any problems should occur.  Please show the CHEMOTHERAPY ALERT CARD or IMMUNOTHERAPY  ALERT CARD at check-in to the Emergency Department and triage nurse.  Should you have questions after your visit or need to cancel or reschedule your appointment, please contact [facility] - A DEPT OF [facility]  [phone] and follow the prompts.  Office hours are 8:00 a.m. to 4:30 p.m. Monday - Friday. Please note that voicemails left after 4:00 p.m. may not be returned until the following business day.  We are closed weekends and major holidays. You have access to a nurse at all times for urgent questions. Please call the main number to the clinic [phone] and follow the prompts.  For any non-urgent questions, you may also contact your provider using MyChart. We now offer e-Visits for anyone 87 and older to request care online for non-urgent symptoms. For details visit [email or web address].   Also download the MyChart app! Go to the app store, search "MyChart", open the app, select , and log in with your MyChart username and password.

## 2023-05-03 DIAGNOSIS — G4731 Primary central sleep apnea: Secondary | ICD-10-CM | POA: Diagnosis not present

## 2023-05-03 NOTE — Telephone Encounter (Signed)
 CPAP 8 cm with F30i wide FF mask Very few central apneas

## 2023-05-05 ENCOUNTER — Telehealth: Payer: Self-pay | Admitting: Adult Health

## 2023-05-05 ENCOUNTER — Other Ambulatory Visit: Payer: Self-pay

## 2023-05-05 ENCOUNTER — Other Ambulatory Visit: Payer: Self-pay | Admitting: Oncology

## 2023-05-05 DIAGNOSIS — G4733 Obstructive sleep apnea (adult) (pediatric): Secondary | ICD-10-CM

## 2023-05-05 MED ORDER — OXYCODONE HCL 15 MG PO TABS
15.0000 mg | ORAL_TABLET | Freq: Three times a day (TID) | ORAL | 0 refills | Status: DC | PRN
  Filled 2023-05-05: qty 90, 30d supply, fill #0

## 2023-05-05 NOTE — Telephone Encounter (Signed)
 CPAP titration study showed optimal control on :  CPAP 8 cm with F30i wide FF mask  Recommend CPAP therapy : CPAP 8 cmH2o.  Needs OV in 2 months

## 2023-05-06 ENCOUNTER — Encounter: Payer: Self-pay | Admitting: Oncology

## 2023-05-06 ENCOUNTER — Inpatient Hospital Stay: Payer: Medicare HMO

## 2023-05-06 ENCOUNTER — Inpatient Hospital Stay (HOSPITAL_BASED_OUTPATIENT_CLINIC_OR_DEPARTMENT_OTHER): Payer: Medicare HMO | Admitting: Oncology

## 2023-05-06 ENCOUNTER — Other Ambulatory Visit: Payer: Self-pay | Admitting: *Deleted

## 2023-05-06 ENCOUNTER — Other Ambulatory Visit: Payer: Self-pay

## 2023-05-06 VITALS — BP 122/80 | HR 84 | Temp 98.8°F | Resp 18 | Wt 176.6 lb

## 2023-05-06 VITALS — BP 133/88 | HR 68 | Resp 16

## 2023-05-06 DIAGNOSIS — Z79899 Other long term (current) drug therapy: Secondary | ICD-10-CM

## 2023-05-06 DIAGNOSIS — Z5112 Encounter for antineoplastic immunotherapy: Secondary | ICD-10-CM | POA: Diagnosis not present

## 2023-05-06 DIAGNOSIS — C9002 Multiple myeloma in relapse: Secondary | ICD-10-CM

## 2023-05-06 DIAGNOSIS — G620 Drug-induced polyneuropathy: Secondary | ICD-10-CM | POA: Diagnosis not present

## 2023-05-06 DIAGNOSIS — Z5111 Encounter for antineoplastic chemotherapy: Secondary | ICD-10-CM

## 2023-05-06 DIAGNOSIS — T451X5A Adverse effect of antineoplastic and immunosuppressive drugs, initial encounter: Secondary | ICD-10-CM | POA: Diagnosis not present

## 2023-05-06 LAB — CBC WITH DIFFERENTIAL (CANCER CENTER ONLY)
Abs Immature Granulocytes: 0.05 10*3/uL (ref 0.00–0.07)
Basophils Absolute: 0.1 10*3/uL (ref 0.0–0.1)
Basophils Relative: 1 %
Eosinophils Absolute: 0.3 10*3/uL (ref 0.0–0.5)
Eosinophils Relative: 6 %
HCT: 41.4 % (ref 39.0–52.0)
Hemoglobin: 14.3 g/dL (ref 13.0–17.0)
Immature Granulocytes: 1 %
Lymphocytes Relative: 24 %
Lymphs Abs: 1.5 10*3/uL (ref 0.7–4.0)
MCH: 31.9 pg (ref 26.0–34.0)
MCHC: 34.5 g/dL (ref 30.0–36.0)
MCV: 92.4 fL (ref 80.0–100.0)
Monocytes Absolute: 0.5 10*3/uL (ref 0.1–1.0)
Monocytes Relative: 8 %
Neutro Abs: 3.8 10*3/uL (ref 1.7–7.7)
Neutrophils Relative %: 60 %
Platelet Count: 195 10*3/uL (ref 150–400)
RBC: 4.48 MIL/uL (ref 4.22–5.81)
RDW: 13.6 % (ref 11.5–15.5)
WBC Count: 6.2 10*3/uL (ref 4.0–10.5)
nRBC: 0 % (ref 0.0–0.2)

## 2023-05-06 LAB — CMP (CANCER CENTER ONLY)
ALT: 19 U/L (ref 0–44)
AST: 22 U/L (ref 15–41)
Albumin: 3.6 g/dL (ref 3.5–5.0)
Alkaline Phosphatase: 75 U/L (ref 38–126)
Anion gap: 9 (ref 5–15)
BUN: 10 mg/dL (ref 6–20)
CO2: 22 mmol/L (ref 22–32)
Calcium: 8.7 mg/dL — ABNORMAL LOW (ref 8.9–10.3)
Chloride: 106 mmol/L (ref 98–111)
Creatinine: 1.5 mg/dL — ABNORMAL HIGH (ref 0.61–1.24)
GFR, Estimated: 56 mL/min — ABNORMAL LOW (ref >60)
Glucose, Bld: 121 mg/dL — ABNORMAL HIGH (ref 70–99)
Potassium: 3.5 mmol/L (ref 3.5–5.1)
Sodium: 137 mmol/L (ref 135–145)
Total Bilirubin: 1 mg/dL (ref 0.0–1.2)
Total Protein: 6.1 g/dL — ABNORMAL LOW (ref 6.5–8.1)

## 2023-05-06 MED ORDER — DEXAMETHASONE SODIUM PHOSPHATE 10 MG/ML IJ SOLN
10.0000 mg | Freq: Once | INTRAMUSCULAR | Status: AC
Start: 2023-05-06 — End: 2023-05-06
  Administered 2023-05-06: 10 mg via INTRAVENOUS
  Filled 2023-05-06: qty 1

## 2023-05-06 MED ORDER — SODIUM CHLORIDE 0.9 % IV SOLN
Freq: Once | INTRAVENOUS | Status: AC
  Filled 2023-05-06: qty 250

## 2023-05-06 MED ORDER — HEPARIN SOD (PORK) LOCK FLUSH 100 UNIT/ML IV SOLN
500.0000 [IU] | Freq: Once | INTRAVENOUS | Status: AC | PRN
  Administered 2023-05-06: 500 [IU]
  Filled 2023-05-06: qty 5

## 2023-05-06 MED ORDER — XTAMPZA ER 9 MG PO C12A
9.0000 mg | EXTENDED_RELEASE_CAPSULE | Freq: Two times a day (BID) | ORAL | 0 refills | Status: DC
  Filled 2023-05-06: qty 60, 30d supply, fill #0

## 2023-05-06 MED ORDER — CARFILZOMIB CHEMO INJECTION 60 MG
20.0000 mg/m2 | Freq: Once | INTRAVENOUS | Status: AC
  Administered 2023-05-06: 36 mg via INTRAVENOUS
  Filled 2023-05-06: qty 13

## 2023-05-06 NOTE — Telephone Encounter (Signed)
 Called and spoke with patient, advised of results/recommendations per Madelin Stank NP.  He verbalized understanding.  He will call the office to schedule his follow up when he receives his CPAP machine.  Order placed for CPAP machine and mask.  Nothing further needed.

## 2023-05-06 NOTE — Patient Instructions (Signed)
 [facility] - A DEPT OF [facility]  Discharge Instructions: Thank you for choosing [facility] to provide your oncology and hematology care.  If you have a lab appointment with the Cancer Center, please go directly to the Cancer Center and check in at the registration area.  Wear comfortable clothing and clothing appropriate for easy access to any Portacath or PICC line.   We strive to give you quality time with your provider. You may need to reschedule your appointment if you arrive late (15 or more minutes).  Arriving late affects you and other patients whose appointments are after yours.  Also, if you miss three or more appointments without notifying the office, you may be dismissed from the clinic at the provider's discretion.      For prescription refill requests, have your pharmacy contact our office and allow 72 hours for refills to be completed.    Today you received the following chemotherapy and/or immunotherapy agents KYPROLIS      To help prevent nausea and vomiting after your treatment, we encourage you to take your nausea medication as directed.  BELOW ARE SYMPTOMS THAT SHOULD BE REPORTED IMMEDIATELY: *FEVER GREATER THAN 100.4 F (38 C) OR HIGHER *CHILLS OR SWEATING *NAUSEA AND VOMITING THAT IS NOT CONTROLLED WITH YOUR NAUSEA MEDICATION *UNUSUAL SHORTNESS OF BREATH *UNUSUAL BRUISING OR BLEEDING *URINARY PROBLEMS (pain or burning when urinating, or frequent urination) *BOWEL PROBLEMS (unusual diarrhea, constipation, pain near the anus) TENDERNESS IN MOUTH AND THROAT WITH OR WITHOUT PRESENCE OF ULCERS (sore throat, sores in mouth, or a toothache) UNUSUAL RASH, SWELLING OR PAIN  UNUSUAL VAGINAL DISCHARGE OR ITCHING   Items with * indicate a potential emergency and should be followed up as soon as possible or go to the Emergency Department if any problems should occur.  Please show the CHEMOTHERAPY ALERT CARD or IMMUNOTHERAPY  ALERT CARD at check-in to the Emergency Department and triage nurse.  Should you have questions after your visit or need to cancel or reschedule your appointment, please contact [facility] - A DEPT OF [facility]  [phone] and follow the prompts.  Office hours are 8:00 a.m. to 4:30 p.m. Monday - Friday. Please note that voicemails left after 4:00 p.m. may not be returned until the following business day.  We are closed weekends and major holidays. You have access to a nurse at all times for urgent questions. Please call the main number to the clinic [phone] and follow the prompts.  For any non-urgent questions, you may also contact your provider using MyChart. We now offer e-Visits for anyone 36 and older to request care online for non-urgent symptoms. For details visit [email or web address].   Also download the MyChart app! Go to the app store, search "MyChart", open the app, select [place], and log in with your MyChart username and password.   Carfilzomib Injection What is this medication? CARFILZOMIB (kar FILZ oh mib) treats multiple myeloma, a type of bone marrow cancer. It works by blocking a protein that causes cancer cells to grow and multiply. This helps to slow or stop the spread of cancer cells. This medicine may be used for other purposes; ask your health care provider or pharmacist if you have questions. COMMON BRAND NAME(S): KYPROLIS What should I tell my care team before I take this medication? They need to know if you have any of these conditions: Heart disease History of blood clots  Irregular heartbeat Kidney disease Liver disease Lung or breathing disease An unusual or allergic reaction to carfilzomib, or other medications, foods, dyes, or preservatives If you or your partner are pregnant or trying to get pregnant Breastfeeding How should I use this medication? This medication is injected into a vein. It is given by your  care team in a hospital or clinic setting. Talk to your care team about the use of this medication in children. Special care may be needed. Overdosage: If you think you have taken too much of this medicine contact a poison control center or emergency room at once. NOTE: This medicine is only for you. Do not share this medicine with others. What if I miss a dose? Keep appointments for follow-up doses. It is important not to miss your dose. Call your care team if you are unable to keep an appointment. What may interact with this medication? Interactions are not expected. This list may not describe all possible interactions. Give your health care provider a list of all the medicines, herbs, non-prescription drugs, or dietary supplements you use. Also tell them if you smoke, drink alcohol, or use illegal drugs. Some items may interact with your medicine. What should I watch for while using this medication? Your condition will be monitored carefully while you are receiving this medication. You may need blood work while taking this medication. Check with your care team if you have severe diarrhea, nausea, and vomiting, or if you sweat a lot. The loss of too much body fluid may make it dangerous for you to take this medication. This medication may affect your coordination, reaction time, or judgment. Do not drive or operate machinery until you know how this medication affects you. Sit up or stand slowly to reduce the risk of dizzy or fainting spells. Drinking alcohol with this medication can increase the risk of these side effects. Talk to your care team if you may be pregnant. Serious birth defects can occur if you take this medication during pregnancy and for 6 months after the last dose. You will need a negative pregnancy test before starting this medication. Contraception is recommended while taking this medication and for 6 months after the last dose. Your care team can help you find an option that works  for you. If your partner can get pregnant, use a condom during sex while taking this medication and for 3 months after the last dose. Do not breastfeed while taking this medication and for 2 weeks after the last dose. This medication may cause infertility. Talk to your care team if you are concerned about your fertility. What side effects may I notice from receiving this medication? Side effects that you should report to your care team as soon as possible: Allergic reactions--skin rash, itching, hives, swelling of the face, lips, tongue, or throat Bleeding--bloody or black, tar-like stools, vomiting blood or brown material that looks like coffee grounds, red or dark brown urine, small red or purple spots on skin, unusual bruising or bleeding Blood clot--pain, swelling, or warmth in the leg, shortness of breath, chest pain Dizziness, loss of balance or coordination, confusion or trouble speaking Heart attack--pain or tightness in the chest, shoulders, arms, or jaw, nausea, shortness of breath, cold or clammy skin, feeling faint or lightheaded Heart failure--shortness of breath, swelling of the ankles, feet, or hands, sudden weight gain, unusual weakness or fatigue Heart rhythm changes--fast or irregular heartbeat, dizziness, feeling faint or lightheaded, chest pain, trouble breathing Increase in blood pressure Infection--fever, chills,  cough, sore throat, wounds that don't heal, pain or trouble when passing urine, general feeling of discomfort or being unwell Infusion reactions--chest pain, shortness of breath or trouble breathing, feeling faint or lightheaded Kidney injury--decrease in the amount of urine, swelling of the ankles, hands, or feet Liver injury--right upper belly pain, loss of appetite, nausea, light-colored stool, dark yellow or brown urine, yellowing skin or eyes, unusual weakness or fatigue Lung injury--shortness of breath or trouble breathing, cough, spitting up blood, chest pain,  fever Pulmonary hypertension--shortness of breath, chest pain, fast or irregular heartbeat, feeling faint or lightheaded, fatigue, swelling of the ankles or feet Stomach pain, bloody diarrhea, pale skin, unusual weakness or fatigue, decrease in the amount of urine, which may be signs of hemolytic uremic syndrome Sudden and severe headache, confusion, change in vision, seizures, which may be signs of posterior reversible encephalopathy syndrome (PRES) TTP--purple spots on the skin or inside the mouth, pale skin, yellowing skin or eyes, unusual weakness or fatigue, fever, fast or irregular heartbeat, confusion, change in vision, trouble speaking, trouble walking Tumor lysis syndrome (TLS)--nausea, vomiting, diarrhea, decrease in the amount of urine, dark urine, unusual weakness or fatigue, confusion, muscle pain or cramps, fast or irregular heartbeat, joint pain Side effects that usually do not require medical attention (report to your care team if they continue or are bothersome): Diarrhea Fatigue Nausea Trouble sleeping This list may not describe all possible side effects. Call your doctor for medical advice about side effects. You may report side effects to FDA at 1-800-FDA-1088. Where should I keep my medication? This medication is given in a hospital or clinic. It will not be stored at home. NOTE: This sheet is a summary. It may not cover all possible information. If you have questions about this medicine, talk to your doctor, pharmacist, or health care provider.  2024 Elsevier/Gold Standard (2021-09-06 00:00:00)

## 2023-05-06 NOTE — Progress Notes (Signed)
 Hematology/Oncology Consult note [facility]  [phone] [phone]  Patient Care Team: Gretta Comer POUR, NP as PCP - General (Internal Medicine) Rayfield Boroughs, MD as PCP - Hematology/Oncology Darliss Rogue, MD as PCP - Cardiology (Cardiology) Melanee Annah BROCKS, MD as Consulting Physician (Hematology and Oncology)   Name of the patient: James House  [id number]  10/21/1970   Date of visit: 05/06/23  Diagnosis- recurrent kappa light chain multiple myeloma in relapse    Chief complaint/ Reason for visit-on treatment assessment prior to cycle 10-day 15 of carfilzomib   Heme/Onc history:  Patient is a 53- year-old male with a history of kappa light chain myeloma that was treated by Dr. Rayfield Hails.  History is as follows:   1.  Patient presented with renal insufficiency with a creatinine of 2.4 in April 2018.  Kidney biopsy showed myeloma cast nephropathy.  Kappa light chain was 3004 and lambda 0.22 with a kappa lambda ratio of 13,000 655.  Skeletal survey showed multiple calvarial and marrow lesions.  Bone marrow biopsy in May 2018 showed 43% plasma cells.  He received CyBorD for cycle 1 in May 2018 and was subsequently switched to RVD.  He received 4 cycles followed by a repeat bone marrow biopsy in August 2018 which showed no increase in plasma cells.   2.  He underwent autologous stem cell transplantation on 01/30/2017.  He was started on Revlimid  maintenance 10 mg daily in January 2019.   3.  Labs from January February and March 2019 done at [facility] showed no M spike, normal kappa lambda ratio of 1.06.  He was last seen by them in April 2019.   4. Following that patient was admitted to [facility] for healthcare associated pneumonia and was recently discharged.  He wished to transfer his care to us  at this time.  He was on maintenance Revlimid  5 mg which was then reduced to 2.5 mg.  Follows up with pain clinic for chemo-induced peripheral neuropathy  with Dr. Tanya.  His urine tested positive for [facility] and therefore patient was discharged from pain clinic   5.  Noted to have gradually increasing free kappa light chainFrom 15 in 20 21-39.5 with a ratio of 3.09.  He was seen by [facility] again and underwent a repeat bone marrow biopsy which did not show any evidence of clonal plasma cells.  PET CT scan also did not show any new active lytic lesions in May 2023..  Revlimid  dose was increased to 15 mg 3 weeks on 1 week off but kappa light chains show continuing to increase   6.  PET CT scan in December 2023 did not show any evidence of active myeloma.  Patient underwent repeat bone marrow biopsy at [facility] which showed 6% plasma cellsConsistent with persistent plasma cell neoplasm.  74 to 86% of isolated plasma cells show loss of T p53 and monosomy 13.  Patient was seen by [name] at [facility] and was recommended Darzalex  Pomalyst  dexamethasone  regimen   7.  Disease progression after 8 weekly cycles of Darazalex kappa light chain increased from 332-646 with a ratio that went up from 39-57 concerning for disease progression.  Patient switched to carfilzomib  Pomalyst  dexamethasone  regimen      Interval history-overall no change in his health status.  He has chronic fatigue and bodyaches which have remained Overall stable.  Blood pressure is presently better controlled  ECOG PS- 2 Pain scale- 4 Opioid associated constipation- no  Review of systems- Review of  Systems  Constitutional:  Positive for malaise/fatigue. Negative for chills, fever and weight loss.  HENT:  Negative for congestion, ear discharge and nosebleeds.   Eyes:  Negative for blurred vision.  Respiratory:  Negative for cough, hemoptysis, sputum production, shortness of breath and wheezing.   Cardiovascular:  Negative for chest pain, palpitations, orthopnea and claudication.  Gastrointestinal:  Negative for abdominal pain, blood in stool, constipation, diarrhea, heartburn, melena, nausea and  vomiting.  Genitourinary:  Negative for dysuria, flank pain, frequency, hematuria and urgency.  Musculoskeletal:  Positive for back pain, joint pain and myalgias.  Skin:  Negative for rash.  Neurological:  Negative for dizziness, tingling, focal weakness, seizures, weakness and headaches.  Endo/Heme/Allergies:  Does not bruise/bleed easily.  Psychiatric/Behavioral:  Negative for depression and suicidal ideas. The patient does not have insomnia.       Allergies  Allergen Reactions   Morphine  Nausea And Vomiting     Past Medical History:  Diagnosis Date   CKD (chronic kidney disease) stage 3, GFR 30-59 ml/min (HCC)    Elevated sedimentation rate 12/24/2017   Hypertension    Multiple myeloma (HCC) 08/11/2017   Neuropathy    Pneumonia    Severe sepsis (HCC) 08/11/2017   Shingles 08/03/2019     Past Surgical History:  Procedure Laterality Date   ABDOMINAL SURGERY     COLONOSCOPY WITH PROPOFOL  N/A 05/25/2021   Procedure: COLONOSCOPY WITH PROPOFOL ;  Surgeon: Unk Corinn Skiff, MD;  Location: [facility] ENDOSCOPY;  Service: Gastroenterology;  Laterality: N/A;   IR IMAGING GUIDED PORT INSERTION  08/02/2022   JOINT REPLACEMENT     right knee   KNEE SURGERY Left     Social History   Socioeconomic History   Marital status: Single    Spouse name: Jon   Number of children: 3   Years of education: Not on file   Highest education level: Not on file  Occupational History   Not on file  Tobacco Use   Smoking status: Former   Smokeless tobacco: Current    Types: Snuff  Vaping Use   Vaping status: Never Used  Substance and Sexual Activity   Alcohol use: No   Drug use: No   Sexual activity: Yes  Other Topics Concern   Not on file  Social History Narrative   Live in private residence with spouse and mother in law   Social Drivers of Health   Financial Resource Strain: Low Risk  (08/12/2017)   Overall Financial Resource Strain (CARDIA)    Difficulty of Paying Living Expenses:  Not hard at all  Food Insecurity: No Food Insecurity (08/12/2017)   Hunger Vital Sign    Worried About Running Out of Food in the Last Year: Never true    Ran Out of Food in the Last Year: Never true  Transportation Needs: No Transportation Needs (08/12/2017)   PRAPARE - Administrator, Civil Service (Medical): No    Lack of Transportation (Non-Medical): No  Physical Activity: Sufficiently Active (08/12/2017)   Exercise Vital Sign    Days of Exercise per Week: 7 days    Minutes of Exercise per Session: 30 min  Stress: No Stress Concern Present (08/12/2017)   [employer] of Occupational Health - Occupational Stress Questionnaire    Feeling of Stress : Only a little  Social Connections: Somewhat Isolated (08/12/2017)   Social Connection and Isolation Panel [NHANES]    Frequency of Communication with Friends and Family: Once a week    Frequency of  Social Gatherings with Friends and Family: Once a week    Attends Religious Services: More than 4 times per year    Active Member of [employer] or Organizations: No    Attends Banker Meetings: Never    Marital Status: Married  Catering Manager Violence: Not At Risk (08/12/2017)   Humiliation, Afraid, Rape, and Kick questionnaire    Fear of Current or Ex-Partner: No    Emotionally Abused: No    Physically Abused: No    Sexually Abused: No    Family History  Problem Relation Age of Onset   Heart disease Mother    Cancer Mother 31       Pancreatic   COPD Mother    Diabetes Mother    Hyperlipidemia Mother    Hypertension Mother    Cancer Maternal Uncle        pancreatic   Heart disease Maternal Grandmother    Cancer Maternal Grandmother 26       colon   COPD Maternal Grandmother    Diabetes Maternal Grandmother    Hypertension Maternal Grandmother      Current Outpatient Medications:    acyclovir  (ZOVIRAX ) 400 MG tablet, Take 1 tablet (400 mg total) by mouth 2 (two) times daily., Disp: 60 tablet, Rfl: 2    albuterol  (VENTOLIN  HFA) 108 (90 Base) MCG/ACT inhaler, Inhale 2 puffs into the lungs every 6 (six) hours as needed for wheezing or shortness of breath., Disp: 6.7 g, Rfl: 2   amitriptyline  (ELAVIL ) 25 MG tablet, Take 1 tablet (25 mg total) by mouth at bedtime. For headache prevention and sleep, Disp: 90 tablet, Rfl: 1   amLODipine -benazepril  (LOTREL ) 10-40 MG capsule, Take 1 capsule by mouth daily. for blood pressure., Disp: 90 capsule, Rfl: 1   aspirin  EC 81 MG tablet, Take by mouth., Disp: , Rfl:    baclofen  (LIORESAL ) 10 MG tablet, Take 1 tablet (10 mg total) by mouth 4 (four) times daily as needed for muscle spasms., Disp: 360 tablet, Rfl: 1   carvedilol  (COREG ) 12.5 MG tablet, Take 1 tablet (12.5 mg total) by mouth 2 (two) times daily., Disp: 180 tablet, Rfl: 1   celecoxib  (CELEBREX ) 200 MG capsule, Take 1 capsule (200 mg total) by mouth daily., Disp: 30 capsule, Rfl: 2   cyanocobalamin  (VITAMIN B12) 1000 MCG/ML injection, Inject 1 mL (1,000 mcg total) into the muscle every 30 (thirty) days., Disp: 1 mL, Rfl: 11   DULoxetine  (CYMBALTA ) 60 MG capsule, Take 1 capsule (60 mg total) by mouth daily., Disp: 60 capsule, Rfl: 1   levofloxacin  (LEVAQUIN ) 750 MG tablet, Take 1 tablet (750 mg total) by mouth every other day., Disp: 5 tablet, Rfl: 0   levothyroxine  (SYNTHROID ) 50 MCG tablet, Take 1 tablet (50 mcg total) by mouth daily before breakfast., Disp: 30 tablet, Rfl: 2   lidocaine -prilocaine  (EMLA ) cream, Apply 1 Application topically as needed., Disp: 30 g, Rfl: 0   montelukast  (SINGULAIR ) 10 MG tablet, Take 1 tablet in the evening 1 day before your treatment. Day of treatment take 1 tablet in the morning for the first 2 treatments of Daratumumab , Disp: 4 tablet, Rfl: 0   OLANZapine  (ZYPREXA ) 10 MG tablet, Take 1 tablet (10 mg total) by mouth at bedtime. To help with nausea prevention., Disp: 30 tablet, Rfl: 1   oxyCODONE  (ROXICODONE ) 15 MG immediate release tablet, Take 1 tablet (15 mg total) by  mouth every 8 (eight) hours as needed for pain., Disp: 90 tablet, Rfl: 0   oxyCODONE   ER (XTAMPZA  ER) 9 MG C12A, Take 9 mg by mouth every 12 (twelve) hours., Disp: 60 capsule, Rfl: 0   pomalidomide  (POMALYST ) 2 MG capsule, Take 1 capsule (2 mg total) by mouth daily. AND 7 DAYS OFF.  CELGENE-REM : [id number]   DONE ON 01/17/2023, Disp: 21 capsule, Rfl: 0   potassium chloride  SA (KLOR-CON  M) 20 MEQ tablet, Take 2 tablets (40 mEq total) by mouth 2 (two) times daily., Disp: 120 tablet, Rfl: 0   pregabalin  (LYRICA ) 300 MG capsule, Take 1 capsule (300 mg total) by mouth 2 (two) times daily., Disp: 180 capsule, Rfl: 0   prochlorperazine  (COMPAZINE ) 10 MG tablet, Take by mouth every 8 (eight) hours as needed., Disp: , Rfl:   Physical exam: There were no vitals filed for this visit. Physical Exam Constitutional:      Comments: Ambulates with a cane and appears in no acute distress  Cardiovascular:     Rate and Rhythm: Normal rate and regular rhythm.     Heart sounds: Normal heart sounds.  Pulmonary:     Effort: Pulmonary effort is normal.     Breath sounds: Normal breath sounds.  Skin:    General: Skin is warm and dry.  Neurological:     Mental Status: He is alert and oriented to person, place, and time.         Latest Ref Rng & Units 04/29/2023    8:09 AM  CMP  Glucose 70 - 99 mg/dL 872   BUN 6 - 20 mg/dL 11   Creatinine 9.38 - 1.24 mg/dL 8.69   Sodium 864 - 854 mmol/L 139   Potassium 3.5 - 5.1 mmol/L 3.8   Chloride 98 - 111 mmol/L 107   CO2 22 - 32 mmol/L 23   Calcium 8.9 - 10.3 mg/dL 8.8   Total Protein 6.5 - 8.1 g/dL 5.9   Total Bilirubin 0.0 - 1.2 mg/dL 0.9   Alkaline Phos 38 - 126 U/L 73   AST 15 - 41 U/L 22   ALT 0 - 44 U/L 18       Latest Ref Rng & Units 04/29/2023    8:09 AM  CBC  WBC 4.0 - 10.5 K/uL 4.9   Hemoglobin 13.0 - 17.0 g/dL 86.1   Hematocrit 60.9 - 52.0 % 39.8   Platelets 150 - 400 K/uL 193     No images are attached to the encounter.  Sleep Study  Documents Result Date: 05/05/2023 Ordered by an unspecified provider.    Assessment and plan- Patient is a 53 y.o. male  with history of kappa light chain multiple myeloma in relapse.  He is here for on treatment assessment prior to cycle 10-day 15 of carfilzomib  and Pomalyst   Counts okay to proceed with cycle 10-day 15 of carfilzomib  today.  He is currently on Pomalyst  3 weeks on and 1 week off as well.Myeloma panel and serum free light chains will be drawn today.  Free light chain ratio has been stable around 4 for the last 3 months.  He is also following up with [facility] and if there is progression on present regimen consideration will have to be given for CAR-T.  Continue aspirin  and acyclovir  prophylaxis.  Chronic back pain unrelated to myeloma as well as pain from peripheral neuropathy: Continue as needed oxycodone  and Xtampza    Visit Diagnosis 1. Encounter for antineoplastic chemotherapy   2. High risk medication use   3. Multiple myeloma in relapse (HCC)   4. Chemotherapy-induced peripheral neuropathy (  HCC)      Dr. Annah Skene, MD, MPH [facility] at [facility] [phone] 05/06/2023 8:40 AM

## 2023-05-06 NOTE — Addendum Note (Signed)
 Addended by: Corene Cornea on: 05/06/2023 09:43 AM   Modules accepted: Orders

## 2023-05-07 ENCOUNTER — Other Ambulatory Visit: Payer: Self-pay | Admitting: *Deleted

## 2023-05-07 DIAGNOSIS — C9002 Multiple myeloma in relapse: Secondary | ICD-10-CM

## 2023-05-07 DIAGNOSIS — R0602 Shortness of breath: Secondary | ICD-10-CM

## 2023-05-07 LAB — KAPPA/LAMBDA LIGHT CHAINS
Kappa free light chain: 54 mg/L — ABNORMAL HIGH (ref 3.3–19.4)
Kappa, lambda light chain ratio: 12 — ABNORMAL HIGH (ref 0.26–1.65)
Lambda free light chains: 4.5 mg/L — ABNORMAL LOW (ref 5.7–26.3)

## 2023-05-07 MED ORDER — ALBUTEROL SULFATE HFA 108 (90 BASE) MCG/ACT IN AERS: 2.0000 | INHALATION_SPRAY | Freq: Four times a day (QID) | RESPIRATORY_TRACT | 6 refills | Status: DC | PRN

## 2023-05-08 ENCOUNTER — Other Ambulatory Visit: Payer: Self-pay

## 2023-05-08 NOTE — Telephone Encounter (Signed)
CPAP ordered see phone note. Patient aware

## 2023-05-13 LAB — MULTIPLE MYELOMA PANEL, SERUM
Albumin SerPl Elph-Mcnc: 3 g/dL (ref 2.9–4.4)
Albumin/Glob SerPl: 1.6 (ref 0.7–1.7)
Alpha 1: 0.2 g/dL (ref 0.0–0.4)
Alpha2 Glob SerPl Elph-Mcnc: 0.6 g/dL (ref 0.4–1.0)
B-Globulin SerPl Elph-Mcnc: 0.8 g/dL (ref 0.7–1.3)
Gamma Glob SerPl Elph-Mcnc: 0.4 g/dL (ref 0.4–1.8)
Globulin, Total: 2 g/dL — ABNORMAL LOW (ref 2.2–3.9)
IgA: 62 mg/dL — ABNORMAL LOW (ref 90–386)
IgG (Immunoglobin G), Serum: 509 mg/dL — ABNORMAL LOW (ref 603–1613)
IgM (Immunoglobulin M), Srm: 36 mg/dL (ref 20–172)
Total Protein ELP: 5 g/dL — ABNORMAL LOW (ref 6.0–8.5)

## 2023-05-15 ENCOUNTER — Other Ambulatory Visit: Payer: Self-pay

## 2023-05-15 DIAGNOSIS — G4733 Obstructive sleep apnea (adult) (pediatric): Secondary | ICD-10-CM | POA: Diagnosis not present

## 2023-05-16 ENCOUNTER — Other Ambulatory Visit: Payer: Self-pay | Admitting: Oncology

## 2023-05-19 ENCOUNTER — Other Ambulatory Visit: Payer: Self-pay | Admitting: Primary Care

## 2023-05-19 ENCOUNTER — Other Ambulatory Visit: Payer: Self-pay

## 2023-05-19 ENCOUNTER — Telehealth: Payer: Self-pay

## 2023-05-19 DIAGNOSIS — R519 Headache, unspecified: Secondary | ICD-10-CM

## 2023-05-19 NOTE — Telephone Encounter (Signed)
Patient called asking for prior auth for Amitriptyline. Called pharm who stated another refill request will have to be sent which will take 4 days. Patient stated he needs now. Informed patient NP for PCP office can submit another refill to local pharmacy. Verbalizes agreement and understanding

## 2023-05-19 NOTE — Telephone Encounter (Unsigned)
Copied from CRM [id number]. Topic: Clinical - Medication Refill >> May 19, 2023  4:19 PM Corin V wrote: Most Recent Primary Care Visit:  Provider: Doreene Nest  Department: [facility]  Visit Type: OFFICE VISIT  Date: 12/25/2022  Medication: amitriptyline (ELAVIL) 25 MG tablet   Has the patient contacted their pharmacy? Yes- they have not completed refill after 3 weeks of patient trying to get refill (Agent: If no, request that the patient contact the pharmacy for the refill. If patient does not wish to contact the pharmacy document the reason why and proceed with request.) (Agent: If yes, when and what did the pharmacy advise?)  Is this the correct pharmacy for this prescription? Yes If no, delete pharmacy and type the correct one.  This is the patient's preferred pharmacy:  [facility] [address] [place] [address] Phone: [phone] Fax: [phone]   Has the prescription been filled recently? No  Is the patient out of the medication? Yes  Has the patient been seen for an appointment in the last year OR does the patient have an upcoming appointment? Yes  Can we respond through MyChart? No  Agent: Please be advised that Rx refills may take up to 3 business days. We ask that you follow-up with your pharmacy.

## 2023-05-19 NOTE — Telephone Encounter (Signed)
Last Fill: 03/17/23  Last OV: Unk Next OV: Unk  Routing to provider for review/authorization.

## 2023-05-20 ENCOUNTER — Other Ambulatory Visit: Payer: Self-pay

## 2023-05-20 ENCOUNTER — Telehealth: Payer: Self-pay | Admitting: *Deleted

## 2023-05-20 ENCOUNTER — Other Ambulatory Visit (HOSPITAL_COMMUNITY): Payer: Self-pay

## 2023-05-20 ENCOUNTER — Encounter: Payer: Self-pay | Admitting: Oncology

## 2023-05-20 ENCOUNTER — Other Ambulatory Visit: Payer: Self-pay | Admitting: Primary Care

## 2023-05-20 DIAGNOSIS — R519 Headache, unspecified: Secondary | ICD-10-CM

## 2023-05-20 MED ORDER — AMITRIPTYLINE HCL 25 MG PO TABS
25.0000 mg | ORAL_TABLET | Freq: Every day | ORAL | 1 refills | Status: DC
  Filled 2023-05-20: qty 90, 90d supply, fill #0

## 2023-05-20 NOTE — Telephone Encounter (Signed)
Copied from CRM [id number]. Topic: Clinical - Medication Question >> May 20, 2023  2:08 PM Almira Coaster wrote: Reason for CRM: Patient does not have any amitriptyline left at home, he is requesting the prescription be sent to [facility]  Phone: [phone] Fax: [phone]

## 2023-05-20 NOTE — Telephone Encounter (Signed)
No our patient.

## 2023-05-20 NOTE — Telephone Encounter (Signed)
Noted. I sent a 90 day supply to [facility] on 03/17/23. He should have another month supply at home. Is this not the case? If he has 1 month at home now, just have him message me via Mychart when about 2 weeks from running out.

## 2023-05-20 NOTE — Telephone Encounter (Signed)
Refills sent to pharmacy.

## 2023-05-20 NOTE — Telephone Encounter (Signed)
Called and spoke with patient, he is wanting to swtich back to using [facility] due to having a bad experience with [facility].

## 2023-05-20 NOTE — Telephone Encounter (Signed)
Called and spoke with patient, he has been out of amitriptyline for 3 weeks. He stated he was never able to get the original prescription sent in November mailed to him.

## 2023-05-20 NOTE — Telephone Encounter (Signed)
Please call patient:  [facility] for [facility] for a 70-month supply.  We received a refill request from [facility] pharmacy for his amitriptyline.  Which pharmacy is he using?

## 2023-05-21 ENCOUNTER — Other Ambulatory Visit: Payer: Self-pay

## 2023-05-21 ENCOUNTER — Encounter: Payer: Self-pay | Admitting: Oncology

## 2023-05-21 ENCOUNTER — Inpatient Hospital Stay: Payer: Medicare HMO

## 2023-05-21 VITALS — BP 139/90 | HR 67 | Temp 96.7°F | Resp 18

## 2023-05-21 DIAGNOSIS — C9002 Multiple myeloma in relapse: Secondary | ICD-10-CM | POA: Diagnosis not present

## 2023-05-21 DIAGNOSIS — Z79899 Other long term (current) drug therapy: Secondary | ICD-10-CM | POA: Diagnosis not present

## 2023-05-21 DIAGNOSIS — Z5112 Encounter for antineoplastic immunotherapy: Secondary | ICD-10-CM | POA: Diagnosis not present

## 2023-05-21 LAB — CBC WITH DIFFERENTIAL (CANCER CENTER ONLY)
Abs Immature Granulocytes: 0.03 10*3/uL (ref 0.00–0.07)
Basophils Absolute: 0 10*3/uL (ref 0.0–0.1)
Basophils Relative: 1 %
Eosinophils Absolute: 0.2 10*3/uL (ref 0.0–0.5)
Eosinophils Relative: 3 %
HCT: 37 % — ABNORMAL LOW (ref 39.0–52.0)
Hemoglobin: 12.7 g/dL — ABNORMAL LOW (ref 13.0–17.0)
Immature Granulocytes: 1 %
Lymphocytes Relative: 17 %
Lymphs Abs: 0.9 10*3/uL (ref 0.7–4.0)
MCH: 31.8 pg (ref 26.0–34.0)
MCHC: 34.3 g/dL (ref 30.0–36.0)
MCV: 92.5 fL (ref 80.0–100.0)
Monocytes Absolute: 0.3 10*3/uL (ref 0.1–1.0)
Monocytes Relative: 6 %
Neutro Abs: 4.1 10*3/uL (ref 1.7–7.7)
Neutrophils Relative %: 72 %
Platelet Count: 172 10*3/uL (ref 150–400)
RBC: 4 MIL/uL — ABNORMAL LOW (ref 4.22–5.81)
RDW: 13.9 % (ref 11.5–15.5)
WBC Count: 5.6 10*3/uL (ref 4.0–10.5)
nRBC: 0 % (ref 0.0–0.2)

## 2023-05-21 LAB — CMP (CANCER CENTER ONLY)
ALT: 20 U/L (ref 0–44)
AST: 24 U/L (ref 15–41)
Albumin: 3.3 g/dL — ABNORMAL LOW (ref 3.5–5.0)
Alkaline Phosphatase: 68 U/L (ref 38–126)
Anion gap: 8 (ref 5–15)
BUN: 11 mg/dL (ref 6–20)
CO2: 22 mmol/L (ref 22–32)
Calcium: 8.3 mg/dL — ABNORMAL LOW (ref 8.9–10.3)
Chloride: 106 mmol/L (ref 98–111)
Creatinine: 1.37 mg/dL — ABNORMAL HIGH (ref 0.61–1.24)
GFR, Estimated: >60 mL/min (ref >60)
Glucose, Bld: 135 mg/dL — ABNORMAL HIGH (ref 70–99)
Potassium: 3.4 mmol/L — ABNORMAL LOW (ref 3.5–5.1)
Sodium: 136 mmol/L (ref 135–145)
Total Bilirubin: 0.7 mg/dL (ref 0.0–1.2)
Total Protein: 5.6 g/dL — ABNORMAL LOW (ref 6.5–8.1)

## 2023-05-21 MED ORDER — DEXAMETHASONE SODIUM PHOSPHATE 10 MG/ML IJ SOLN
10.0000 mg | Freq: Once | INTRAMUSCULAR | Status: AC
  Administered 2023-05-21: 10 mg via INTRAVENOUS

## 2023-05-21 MED ORDER — DEXTROSE 5 % IV SOLN
20.0000 mg/m2 | Freq: Once | INTRAVENOUS | Status: AC
  Administered 2023-05-21: 36 mg via INTRAVENOUS
  Filled 2023-05-21: qty 15

## 2023-05-21 MED ORDER — PROCHLORPERAZINE EDISYLATE 10 MG/2ML IJ SOLN
10.0000 mg | Freq: Once | INTRAMUSCULAR | Status: AC
  Administered 2023-05-21: 10 mg via INTRAVENOUS

## 2023-05-21 MED ORDER — HEPARIN SOD (PORK) LOCK FLUSH 100 UNIT/ML IV SOLN
500.0000 [IU] | Freq: Once | INTRAVENOUS | Status: AC | PRN
  Administered 2023-05-21: 500 [IU]
  Filled 2023-05-21: qty 5

## 2023-05-21 MED ORDER — SODIUM CHLORIDE 0.9 % IV SOLN
Freq: Once | INTRAVENOUS | Status: AC
  Filled 2023-05-21: qty 250

## 2023-05-21 NOTE — Patient Instructions (Signed)
[facility] - A DEPT OF [facility]  Discharge Instructions: Thank you for choosing [facility] to provide your oncology and hematology care.  If you have a lab appointment with the Cancer Center, please go directly to the Cancer Center and check in at the registration area.  Wear comfortable clothing and clothing appropriate for easy access to any Portacath or PICC line.   We strive to give you quality time with your provider. You may need to reschedule your appointment if you arrive late (15 or more minutes).  Arriving late affects you and other patients whose appointments are after yours.  Also, if you miss three or more appointments without notifying the office, you may be dismissed from the clinic at the provider's discretion.      For prescription refill requests, have your pharmacy contact our office and allow 72 hours for refills to be completed.    Today you received the following chemotherapy and/or immunotherapy agents kyprolis      To help prevent nausea and vomiting after your treatment, we encourage you to take your nausea medication as directed.  BELOW ARE SYMPTOMS THAT SHOULD BE REPORTED IMMEDIATELY: *FEVER GREATER THAN 100.4 F (38 C) OR HIGHER *CHILLS OR SWEATING *NAUSEA AND VOMITING THAT IS NOT CONTROLLED WITH YOUR NAUSEA MEDICATION *UNUSUAL SHORTNESS OF BREATH *UNUSUAL BRUISING OR BLEEDING *URINARY PROBLEMS (pain or burning when urinating, or frequent urination) *BOWEL PROBLEMS (unusual diarrhea, constipation, pain near the anus) TENDERNESS IN MOUTH AND THROAT WITH OR WITHOUT PRESENCE OF ULCERS (sore throat, sores in mouth, or a toothache) UNUSUAL RASH, SWELLING OR PAIN  UNUSUAL VAGINAL DISCHARGE OR ITCHING   Items with * indicate a potential emergency and should be followed up as soon as possible or go to the Emergency Department if any problems should occur.  Please show the CHEMOTHERAPY ALERT CARD or IMMUNOTHERAPY  ALERT CARD at check-in to the Emergency Department and triage nurse.  Should you have questions after your visit or need to cancel or reschedule your appointment, please contact [facility] - A DEPT OF [facility]  [phone] and follow the prompts.  Office hours are 8:00 a.m. to 4:30 p.m. Monday - Friday. Please note that voicemails left after 4:00 p.m. may not be returned until the following business day.  We are closed weekends and major holidays. You have access to a nurse at all times for urgent questions. Please call the main number to the clinic [phone] and follow the prompts.  For any non-urgent questions, you may also contact your provider using MyChart. We now offer e-Visits for anyone 67 and older to request care online for non-urgent symptoms. For details visit [email or web address].   Also download the MyChart app! Go to the app store, search "MyChart", open the app, select [place], and log in with your MyChart username and password.

## 2023-05-27 ENCOUNTER — Encounter: Payer: Self-pay | Admitting: Oncology

## 2023-05-27 ENCOUNTER — Ambulatory Visit: Payer: Medicare HMO

## 2023-05-27 ENCOUNTER — Inpatient Hospital Stay: Payer: Medicare HMO

## 2023-05-27 ENCOUNTER — Inpatient Hospital Stay: Payer: Medicare HMO | Attending: Internal Medicine | Admitting: Oncology

## 2023-05-27 ENCOUNTER — Other Ambulatory Visit: Payer: Medicare HMO

## 2023-05-27 ENCOUNTER — Other Ambulatory Visit: Payer: Self-pay

## 2023-05-27 VITALS — BP 159/111 | HR 76 | Temp 97.2°F | Resp 18 | Wt 181.9 lb

## 2023-05-27 VITALS — BP 156/104 | HR 72

## 2023-05-27 DIAGNOSIS — C9002 Multiple myeloma in relapse: Secondary | ICD-10-CM

## 2023-05-27 DIAGNOSIS — Z5112 Encounter for antineoplastic immunotherapy: Secondary | ICD-10-CM | POA: Diagnosis present

## 2023-05-27 DIAGNOSIS — I10 Essential (primary) hypertension: Secondary | ICD-10-CM | POA: Diagnosis not present

## 2023-05-27 DIAGNOSIS — E876 Hypokalemia: Secondary | ICD-10-CM | POA: Insufficient documentation

## 2023-05-27 DIAGNOSIS — Z79899 Other long term (current) drug therapy: Secondary | ICD-10-CM | POA: Diagnosis not present

## 2023-05-27 DIAGNOSIS — Z5111 Encounter for antineoplastic chemotherapy: Secondary | ICD-10-CM | POA: Diagnosis not present

## 2023-05-27 LAB — CMP (CANCER CENTER ONLY)
ALT: 24 U/L (ref 0–44)
AST: 26 U/L (ref 15–41)
Albumin: 3.6 g/dL (ref 3.5–5.0)
Alkaline Phosphatase: 79 U/L (ref 38–126)
Anion gap: 9 (ref 5–15)
BUN: 10 mg/dL (ref 6–20)
CO2: 24 mmol/L (ref 22–32)
Calcium: 8.7 mg/dL — ABNORMAL LOW (ref 8.9–10.3)
Chloride: 105 mmol/L (ref 98–111)
Creatinine: 1.68 mg/dL — ABNORMAL HIGH (ref 0.61–1.24)
GFR, Estimated: 49 mL/min — ABNORMAL LOW (ref >60)
Glucose, Bld: 149 mg/dL — ABNORMAL HIGH (ref 70–99)
Potassium: 3.7 mmol/L (ref 3.5–5.1)
Sodium: 138 mmol/L (ref 135–145)
Total Bilirubin: 1 mg/dL (ref 0.0–1.2)
Total Protein: 6.1 g/dL — ABNORMAL LOW (ref 6.5–8.1)

## 2023-05-27 LAB — CBC WITH DIFFERENTIAL (CANCER CENTER ONLY)
Abs Immature Granulocytes: 0.05 10*3/uL (ref 0.00–0.07)
Basophils Absolute: 0 10*3/uL (ref 0.0–0.1)
Basophils Relative: 1 %
Eosinophils Absolute: 0.3 10*3/uL (ref 0.0–0.5)
Eosinophils Relative: 6 %
HCT: 40 % (ref 39.0–52.0)
Hemoglobin: 14.2 g/dL (ref 13.0–17.0)
Immature Granulocytes: 1 %
Lymphocytes Relative: 30 %
Lymphs Abs: 1.4 10*3/uL (ref 0.7–4.0)
MCH: 32.3 pg (ref 26.0–34.0)
MCHC: 35.5 g/dL (ref 30.0–36.0)
MCV: 91.1 fL (ref 80.0–100.0)
Monocytes Absolute: 0.3 10*3/uL (ref 0.1–1.0)
Monocytes Relative: 7 %
Neutro Abs: 2.6 10*3/uL (ref 1.7–7.7)
Neutrophils Relative %: 55 %
Platelet Count: 198 10*3/uL (ref 150–400)
RBC: 4.39 MIL/uL (ref 4.22–5.81)
RDW: 13.7 % (ref 11.5–15.5)
WBC Count: 4.7 10*3/uL (ref 4.0–10.5)
nRBC: 0 % (ref 0.0–0.2)

## 2023-05-27 MED ORDER — CYCLOBENZAPRINE HCL 5 MG PO TABS
5.0000 mg | ORAL_TABLET | Freq: Two times a day (BID) | ORAL | 0 refills | Status: DC
  Filled 2023-05-27: qty 14, 7d supply, fill #0

## 2023-05-27 MED ORDER — DEXTROSE 5 % IV SOLN
20.0000 mg/m2 | Freq: Once | INTRAVENOUS | Status: AC
  Administered 2023-05-27: 36 mg via INTRAVENOUS
  Filled 2023-05-27: qty 13

## 2023-05-27 MED ORDER — SODIUM CHLORIDE 0.9 % IV SOLN
Freq: Once | INTRAVENOUS | Status: AC
Start: 2023-05-27 — End: 2023-05-27
  Filled 2023-05-27: qty 250

## 2023-05-27 MED ORDER — HEPARIN SOD (PORK) LOCK FLUSH 100 UNIT/ML IV SOLN
500.0000 [IU] | Freq: Once | INTRAVENOUS | Status: AC | PRN
  Administered 2023-05-27: 500 [IU]
  Filled 2023-05-27: qty 5

## 2023-05-27 MED ORDER — METHOCARBAMOL 750 MG PO TABS
750.0000 mg | ORAL_TABLET | Freq: Three times a day (TID) | ORAL | 0 refills | Status: DC
  Filled 2023-05-27: qty 90, 30d supply, fill #0

## 2023-05-27 MED ORDER — DEXAMETHASONE 4 MG PO TABS
20.0000 mg | ORAL_TABLET | Freq: Once | ORAL | Status: AC
  Administered 2023-05-27: 20 mg via ORAL
  Filled 2023-05-27: qty 5

## 2023-05-27 NOTE — Progress Notes (Signed)
 At discharge, patient BP was 156/104. Dr. Melanee aware of hypertension today and wanted to proceed with treatment. Educated patient to continue to take prescribed hypertension medications, check blood pressure at home and call his cardiologist to adjust his medications as needed. Patient verbalized understanding.

## 2023-05-27 NOTE — Progress Notes (Signed)
Hematology/Oncology Consult note [facility]  [phone] [phone]  Patient Care Team: Doreene Nest, NP as PCP - General (Internal Medicine) Martin Majestic, MD as PCP - Hematology/Oncology Debbe Odea, MD as PCP - Cardiology (Cardiology) Creig Hines, MD as Consulting Physician (Hematology and Oncology)   Name of the patient: James House  [id number]  07-May-1970   Date of visit: 05/27/23  Diagnosis- recurrent kappa light chain multiple myeloma in relapse    Chief complaint/ Reason for visit- on treatment assessment prior to cycle 11 day 8 of kyprolis  Heme/Onc history: Patient is a 53- year-old male with a history of kappa light chain myeloma that was treated by [name].  History is as follows:   1.  Patient presented with renal insufficiency with a creatinine of 2.4 in April 2018.  Kidney biopsy showed myeloma cast nephropathy.  Kappa light chain was 3004 and lambda 0.22 with a kappa lambda ratio of 13,000 655.  Skeletal survey showed multiple calvarial and marrow lesions.  Bone marrow biopsy in May 2018 showed 43% plasma cells.  He received CyBorD for cycle 1 in May 2018 and was subsequently switched to RVD.  He received 4 cycles followed by a repeat bone marrow biopsy in August 2018 which showed no increase in plasma cells.   2.  He underwent autologous stem cell transplantation on 01/30/2017.  He was started on Revlimid maintenance 10 mg daily in January 2019.   3.  Labs from January February and March 2019 done at [facility] showed no M spike, normal kappa lambda ratio of 1.06.  He was last seen by them in April 2019.   4. Following that patient was admitted to [facility] for healthcare associated pneumonia and was recently discharged.  He wished to transfer his care to [place] at this time.  He was on maintenance Revlimid 5 mg which was then reduced to 2.5 mg.  Follows up with pain clinic for chemo-induced peripheral neuropathy  with [name].  His urine tested positive for [facility] and therefore patient was discharged from pain clinic   5.  Noted to have gradually increasing free kappa light chainFrom 15 in 20 21-39.5 with a ratio of 3.09.  He was seen by [facility] again and underwent a repeat bone marrow biopsy which did not show any evidence of clonal plasma cells.  PET CT scan also did not show any new active lytic lesions in May 2023.Marland Kitchen  Revlimid dose was increased to 15 mg 3 weeks on 1 week off but kappa light chains show continuing to increase   6.  PET CT scan in December 2023 did not show any evidence of active myeloma.  Patient underwent repeat bone marrow biopsy at [facility] which showed 6% plasma cellsConsistent with persistent plasma cell neoplasm.  74 to 86% of isolated plasma cells show loss of T p53 and monosomy 13.  Patient was seen by [name] at [facility] and was recommended Darzalex Pomalyst dexamethasone regimen   7.  Disease progression after 8 weekly cycles of Darazalex kappa light chain increased from 332-646 with a ratio that went up from 39-57 concerning for disease progression.  Patient switched to carfilzomib Pomalyst dexamethasone regimen      Interval history-patient had a fall a few days ago and states that his left arm is hurting although he continues to have full range of motion.  He has chronic fatigue and peripheral neuropathy.  Blood pressure is elevated in clinic today  ECOG PS- 2 Pain scale- 6   Review of systems- Review of Systems  Constitutional:  Positive for malaise/fatigue. Negative for chills, fever and weight loss.  HENT:  Negative for congestion, ear discharge and nosebleeds.   Eyes:  Negative for blurred vision.  Respiratory:  Negative for cough, hemoptysis, sputum production, shortness of breath and wheezing.   Cardiovascular:  Negative for chest pain, palpitations, orthopnea and claudication.  Gastrointestinal:  Negative for abdominal pain, blood in stool, constipation, diarrhea,  heartburn, melena, nausea and vomiting.  Genitourinary:  Negative for dysuria, flank pain, frequency, hematuria and urgency.  Musculoskeletal:  Negative for back pain, joint pain and myalgias.       Left arm pain  Skin:  Negative for rash.  Neurological:  Positive for sensory change (Peripheral neuropathy). Negative for dizziness, tingling, focal weakness, seizures, weakness and headaches.  Endo/Heme/Allergies:  Does not bruise/bleed easily.  Psychiatric/Behavioral:  Negative for depression and suicidal ideas. The patient does not have insomnia.       Allergies  Allergen Reactions   Morphine Nausea And Vomiting     Past Medical History:  Diagnosis Date   CKD (chronic kidney disease) stage 3, GFR 30-59 ml/min (HCC)    Elevated sedimentation rate 12/24/2017   Hypertension    Multiple myeloma (HCC) 08/11/2017   Neuropathy    Pneumonia    Severe sepsis (HCC) 08/11/2017   Shingles 08/03/2019     Past Surgical History:  Procedure Laterality Date   ABDOMINAL SURGERY     COLONOSCOPY WITH PROPOFOL N/A 05/25/2021   Procedure: COLONOSCOPY WITH PROPOFOL;  Surgeon: Toney Reil, MD;  Location: [facility] ENDOSCOPY;  Service: Gastroenterology;  Laterality: N/A;   IR IMAGING GUIDED PORT INSERTION  08/02/2022   JOINT REPLACEMENT     right knee   KNEE SURGERY Left     Social History   Socioeconomic History   Marital status: Single    Spouse name: [name]   Number of children: 3   Years of education: Not on file   Highest education level: Not on file  Occupational History   Not on file  Tobacco Use   Smoking status: Former   Smokeless tobacco: Current    Types: Snuff  Vaping Use   Vaping status: Never Used  Substance and Sexual Activity   Alcohol use: No   Drug use: No   Sexual activity: Yes  Other Topics Concern   Not on file  Social History Narrative   Live in private residence with spouse and mother in law   Social Drivers of Health   Financial Resource Strain: Low  Risk  (08/12/2017)   Overall Financial Resource Strain (CARDIA)    Difficulty of Paying Living Expenses: Not hard at all  Food Insecurity: No Food Insecurity (08/12/2017)   Hunger Vital Sign    Worried About Running Out of Food in the Last Year: Never true    Ran Out of Food in the Last Year: Never true  Transportation Needs: No Transportation Needs (08/12/2017)   PRAPARE - Administrator, Civil Service (Medical): No    Lack of Transportation (Non-Medical): No  Physical Activity: Sufficiently Active (08/12/2017)   Exercise Vital Sign    Days of Exercise per Week: 7 days    Minutes of Exercise per Session: 30 min  Stress: No Stress Concern Present (08/12/2017)   [employer] of Occupational Health - Occupational Stress Questionnaire    Feeling of Stress : Only a little  Social Connections: Somewhat  Isolated (08/12/2017)   Social Connection and Isolation Panel [NHANES]    Frequency of Communication with Friends and Family: Once a week    Frequency of Social Gatherings with Friends and Family: Once a week    Attends Religious Services: More than 4 times per year    Active Member of [employer] or Organizations: No    Attends Banker Meetings: Never    Marital Status: Married  Catering manager Violence: Not At Risk (08/12/2017)   Humiliation, Afraid, Rape, and Kick questionnaire    Fear of Current or Ex-Partner: No    Emotionally Abused: No    Physically Abused: No    Sexually Abused: No    Family History  Problem Relation Age of Onset   Heart disease Mother    Cancer Mother 58       Pancreatic   COPD Mother    Diabetes Mother    Hyperlipidemia Mother    Hypertension Mother    Cancer Maternal Uncle        pancreatic   Heart disease Maternal Grandmother    Cancer Maternal Grandmother 36       colon   COPD Maternal Grandmother    Diabetes Maternal Grandmother    Hypertension Maternal Grandmother      Current Outpatient Medications:    cyclobenzaprine  (FLEXERIL) 5 MG tablet, Take 1 tablet (5 mg total) by mouth 2 (two) times daily., Disp: 14 tablet, Rfl: 0   methocarbamol (ROBAXIN-750) 750 MG tablet, Take 1 tablet (750 mg total) by mouth 3 (three) times daily., Disp: 90 tablet, Rfl: 0   acyclovir (ZOVIRAX) 400 MG tablet, Take 1 tablet (400 mg total) by mouth 2 (two) times daily., Disp: 60 tablet, Rfl: 2   albuterol (VENTOLIN HFA) 108 (90 Base) MCG/ACT inhaler, Inhale 2 puffs into the lungs every 6 (six) hours as needed for wheezing or shortness of breath., Disp: 6.7 g, Rfl: 6   amitriptyline (ELAVIL) 25 MG tablet, Take 1 tablet (25 mg total) by mouth at bedtime. For headache prevention and sleep, Disp: 90 tablet, Rfl: 1   amLODipine-benazepril (LOTREL) 10-40 MG capsule, Take 1 capsule by mouth daily. for blood pressure., Disp: 90 capsule, Rfl: 1   aspirin EC 81 MG tablet, Take by mouth., Disp: , Rfl:    baclofen (LIORESAL) 10 MG tablet, Take 1 tablet (10 mg total) by mouth 4 (four) times daily as needed for muscle spasms., Disp: 360 tablet, Rfl: 1   carvedilol (COREG) 12.5 MG tablet, Take 1 tablet (12.5 mg total) by mouth 2 (two) times daily., Disp: 180 tablet, Rfl: 1   celecoxib (CELEBREX) 200 MG capsule, Take 1 capsule (200 mg total) by mouth daily., Disp: 30 capsule, Rfl: 2   cyanocobalamin (VITAMIN B12) 1000 MCG/ML injection, Inject 1 mL (1,000 mcg total) into the muscle every 30 (thirty) days., Disp: 1 mL, Rfl: 11   DULoxetine (CYMBALTA) 60 MG capsule, Take 1 capsule (60 mg total) by mouth daily., Disp: 60 capsule, Rfl: 1   levofloxacin (LEVAQUIN) 750 MG tablet, Take 1 tablet (750 mg total) by mouth every other day., Disp: 5 tablet, Rfl: 0   levothyroxine (SYNTHROID) 50 MCG tablet, Take 1 tablet (50 mcg total) by mouth daily before breakfast., Disp: 30 tablet, Rfl: 2   lidocaine-prilocaine (EMLA) cream, Apply 1 Application topically as needed., Disp: 30 g, Rfl: 0   montelukast (SINGULAIR) 10 MG tablet, Take 1 tablet in the evening 1 day before  your treatment. Day of treatment take 1  tablet in the morning for the first 2 treatments of Daratumumab, Disp: 4 tablet, Rfl: 0   OLANZapine (ZYPREXA) 10 MG tablet, Take 1 tablet (10 mg total) by mouth at bedtime. To help with nausea prevention., Disp: 30 tablet, Rfl: 1   oxyCODONE (ROXICODONE) 15 MG immediate release tablet, Take 1 tablet (15 mg total) by mouth every 8 (eight) hours as needed for pain., Disp: 90 tablet, Rfl: 0   oxyCODONE ER (XTAMPZA ER) 9 MG C12A, Take 9 mg by mouth every 12 (twelve) hours., Disp: 60 capsule, Rfl: 0   pomalidomide (POMALYST) 2 MG capsule, Take 1 capsule (2 mg total) by mouth daily. AND 7 DAYS OFF.  CELGENE-REM : [id number]   DONE ON 01/17/2023, Disp: 21 capsule, Rfl: 0   potassium chloride SA (KLOR-CON M) 20 MEQ tablet, Take 2 tablets (40 mEq total) by mouth 2 (two) times daily., Disp: 120 tablet, Rfl: 0   pregabalin (LYRICA) 300 MG capsule, Take 1 capsule (300 mg total) by mouth 2 (two) times daily., Disp: 180 capsule, Rfl: 0   prochlorperazine (COMPAZINE) 10 MG tablet, Take by mouth every 8 (eight) hours as needed., Disp: , Rfl:   Physical exam:  Vitals:   05/27/23 0901 05/27/23 0903  BP: (!) 155/119 (!) 159/111  Pulse: 76   Resp: 18   Temp: (!) 97.2 F (36.2 C)   TempSrc: Tympanic   SpO2: 100%   Weight: 181 lb 14.4 oz (82.5 kg)    Physical Exam Cardiovascular:     Rate and Rhythm: Normal rate and regular rhythm.     Heart sounds: Normal heart sounds.  Pulmonary:     Effort: Pulmonary effort is normal.     Breath sounds: Normal breath sounds.  Musculoskeletal:        General: No swelling or deformity.  Skin:    General: Skin is warm and dry.  Neurological:     Mental Status: He is alert and oriented to person, place, and time.         Latest Ref Rng & Units 05/27/2023    8:37 AM  CMP  Glucose 70 - 99 mg/dL 540   BUN 6 - 20 mg/dL 10   Creatinine 9.81 - 1.24 mg/dL 1.91   Sodium 478 - 295 mmol/L 138   Potassium 3.5 - 5.1 mmol/L 3.7    Chloride 98 - 111 mmol/L 105   CO2 22 - 32 mmol/L 24   Calcium 8.9 - 10.3 mg/dL 8.7   Total Protein 6.5 - 8.1 g/dL 6.1   Total Bilirubin 0.0 - 1.2 mg/dL 1.0   Alkaline Phos 38 - 126 U/L 79   AST 15 - 41 U/L 26   ALT 0 - 44 U/L 24       Latest Ref Rng & Units 05/27/2023    8:37 AM  CBC  WBC 4.0 - 10.5 K/uL 4.7   Hemoglobin 13.0 - 17.0 g/dL 62.1   Hematocrit 30.8 - 52.0 % 40.0   Platelets 150 - 400 K/uL 198     No images are attached to the encounter.  Sleep Study Documents Result Date: 05/05/2023 Ordered by an unspecified provider.    Assessment and plan- Patient is a 53 y.o. male  with history of kappa light chain multiple myeloma in relapse.  He is here for on treatment assessment prior to cycle 11-day 8 of carfilzomib and Pomalyst  Myeloma panel from 2 weeks ago did not show any evidence of M protein but serum free light  chains continues to increase and his present ratio is 12 as compared to 4 in October 2024.  Overall this is concerning for relapse.  He has been seen by [facility] recently as well and they have discussed CAR-T cell therapy versus bispecific antibody treatment with him should his free light chains continue to rise.  I will plan to check repeat labs in 2 weeks time and he has an appointment with [facility] in 1 month  He will proceed with carfilzomib today and continue with Pomalyst 2 mg 3 weeks on and 1 week off.  His blood pressure in the past has been well controlled based on office readings but today his blood pressure is 159/111.  He will be reaching out to cardiology  Chronic back pain and pain from peripheral neuropathy: Continue as needed oxycodone and Xtampza.  Also continue with aspirin and acyclovir prophylaxis   Visit Diagnosis 1. Encounter for antineoplastic chemotherapy   2. High risk medication use   3. Multiple myeloma in remission (HCC)   4. Multiple myeloma in relapse [facility])      Dr. Owens Shark, MD, MPH [facility] [phone] 05/27/2023 12:24 PM

## 2023-05-29 ENCOUNTER — Ambulatory Visit: Payer: Medicare HMO | Admitting: Adult Health

## 2023-05-29 ENCOUNTER — Encounter: Payer: Self-pay | Admitting: Adult Health

## 2023-05-29 ENCOUNTER — Telehealth: Payer: Self-pay | Admitting: *Deleted

## 2023-05-29 VITALS — BP 134/92 | Ht 67.0 in | Wt 186.4 lb

## 2023-05-29 DIAGNOSIS — G894 Chronic pain syndrome: Secondary | ICD-10-CM | POA: Diagnosis not present

## 2023-05-29 DIAGNOSIS — G4733 Obstructive sleep apnea (adult) (pediatric): Secondary | ICD-10-CM

## 2023-05-29 DIAGNOSIS — G4731 Primary central sleep apnea: Secondary | ICD-10-CM

## 2023-05-29 NOTE — Progress Notes (Signed)
 @Patient  ID: Norleen LELON Wilkie Mickey., male    DOB: 09-18-1970, 53 y.o.   MRN: [id number]  Chief Complaint  Patient presents with   Follow-up    Referring provider: Gretta Comer POUR, NP  HPI: 53 year old male seen for sleep consult March 06, 2023 for snoring, restless sleep and daytime sleepiness found to have very severe complex sleep apnea Medical history significant for multiple myeloma followed by oncology-[facility] Dr. [facility]  (Dx 2018 , s/p stem cell transplant 2018 , Rx Revlimid , Recurrence 03/2022-Darazalex  , currently on Carfilzomib   Has chronic back pain and peripheral neuropathy -On chronic narcotics, Lyrica  and muscle relaxers   TEST/EVENTS :   05/29/2023 Follow up : OSA  Patient returns for 95-month follow-up.  Patient was seen in November for a sleep consult.  He had snoring and daytime sleepiness.  He was set up for a home sleep study that showed very severe sleep apnea with central predominant sleep apnea and nocturnal hypoxemia.  Home sleep study on March 16, 2023 showed AHI at 100.4 with 94% central events and SpO2 low at 84%.  He was set up for a CPAP titration study that was completed on April 29, 2023 that showed optimal control on CPAP 8 cm H2O.  Patient was started on CPAP.  Patient says he got CPAP about 2 weeks ago has been having difficulty tolerating.  Feels that he is smothering at times.  Patient was recommended to get a ResMed F30 I but unfortunately DME company gave him a different fullface mask.  Patient says the mask is very uncomfortable.  CPAP download shows excellent compliance for the last 2 weeks with daily average usage around 6.5 hours.  Patient is on CPAP 8 cm H2O.  AHI is 23/hour.  Central events at 22.4/hour.  No significant mask leaks.  Patient says he was off of his amitriptyline  for about 3 weeks during the time period that he got his CPAP titration study.  He has recently restarted amitriptyline .  Patient says he does feel tired with  low energy.  Feels his sleep is restless.    Allergies  Allergen Reactions   Morphine  Nausea And Vomiting    Immunization History  Administered Date(s) Administered   Influenza,inj,Quad PF,6+ Mos 01/19/2016, 02/13/2018   Pneumococcal Conjugate-13 02/13/2018, 04/16/2018, 06/18/2018   Tdap 02/13/2018, 04/16/2018, 06/18/2018, 04/09/2020   Zoster Recombinant(Shingrix ) 11/21/2021    Past Medical History:  Diagnosis Date   CKD (chronic kidney disease) stage 3, GFR 30-59 ml/min (HCC)    Elevated sedimentation rate 12/24/2017   Hypertension    Multiple myeloma (HCC) 08/11/2017   Neuropathy    Pneumonia    Severe sepsis (HCC) 08/11/2017   Shingles 08/03/2019    Tobacco History: Social History   Tobacco Use  Smoking Status Former  Smokeless Tobacco Current   Types: Snuff   Ready to quit: Not Answered Counseling given: Not Answered   Outpatient Medications Prior to Visit  Medication Sig Dispense Refill   acyclovir  (ZOVIRAX ) 400 MG tablet Take 1 tablet (400 mg total) by mouth 2 (two) times daily. 60 tablet 2   albuterol  (VENTOLIN  HFA) 108 (90 Base) MCG/ACT inhaler Inhale 2 puffs into the lungs every 6 (six) hours as needed for wheezing or shortness of breath. 6.7 g 6   amitriptyline  (ELAVIL ) 25 MG tablet Take 1 tablet (25 mg total) by mouth at bedtime. For headache prevention and sleep 90 tablet 1   amLODipine -benazepril  (LOTREL ) 10-40 MG capsule Take 1  capsule by mouth daily. for blood pressure. 90 capsule 1   aspirin  EC 81 MG tablet Take by mouth.     baclofen  (LIORESAL ) 10 MG tablet Take 1 tablet (10 mg total) by mouth 4 (four) times daily as needed for muscle spasms. 360 tablet 1   carvedilol  (COREG ) 12.5 MG tablet Take 1 tablet (12.5 mg total) by mouth 2 (two) times daily. 180 tablet 1   celecoxib  (CELEBREX ) 200 MG capsule Take 1 capsule (200 mg total) by mouth daily. 30 capsule 2   cyanocobalamin  (VITAMIN B12) 1000 MCG/ML injection Inject 1 mL (1,000 mcg total) into the  muscle every 30 (thirty) days. 1 mL 11   cyclobenzaprine  (FLEXERIL ) 5 MG tablet Take 1 tablet (5 mg total) by mouth 2 (two) times daily. 14 tablet 0   levothyroxine  (SYNTHROID ) 50 MCG tablet Take 1 tablet (50 mcg total) by mouth daily before breakfast. 30 tablet 2   lidocaine -prilocaine  (EMLA ) cream Apply 1 Application topically as needed. 30 g 0   methocarbamol  (ROBAXIN -750) 750 MG tablet Take 1 tablet (750 mg total) by mouth 3 (three) times daily. 90 tablet 0   montelukast  (SINGULAIR ) 10 MG tablet Take 1 tablet in the evening 1 day before your treatment. Day of treatment take 1 tablet in the morning for the first 2 treatments of Daratumumab  4 tablet 0   OLANZapine  (ZYPREXA ) 10 MG tablet Take 1 tablet (10 mg total) by mouth at bedtime. To help with nausea prevention. 30 tablet 1   oxyCODONE  (ROXICODONE ) 15 MG immediate release tablet Take 1 tablet (15 mg total) by mouth every 8 (eight) hours as needed for pain. 90 tablet 0   oxyCODONE  ER (XTAMPZA  ER) 9 MG C12A Take 9 mg by mouth every 12 (twelve) hours. 60 capsule 0   pomalidomide  (POMALYST ) 2 MG capsule Take 1 capsule (2 mg total) by mouth daily. AND 7 DAYS OFF.  CELGENE-REM : [id number]   DONE ON 01/17/2023 21 capsule 0   potassium chloride  SA (KLOR-CON  M) 20 MEQ tablet Take 2 tablets (40 mEq total) by mouth 2 (two) times daily. 120 tablet 0   pregabalin  (LYRICA ) 300 MG capsule Take 1 capsule (300 mg total) by mouth 2 (two) times daily. 180 capsule 0   prochlorperazine  (COMPAZINE ) 10 MG tablet Take by mouth every 8 (eight) hours as needed.     DULoxetine  (CYMBALTA ) 60 MG capsule Take 1 capsule (60 mg total) by mouth daily. 60 capsule 1   levofloxacin  (LEVAQUIN ) 750 MG tablet Take 1 tablet (750 mg total) by mouth every other day. (Patient not taking: Reported on 05/29/2023) 5 tablet 0   No facility-administered medications prior to visit.     Review of Systems:   Constitutional:   No  weight loss, night sweats,  Fevers, chills, +fatigue, or   lassitude.  HEENT:   No headaches,  Difficulty swallowing,  Tooth/dental problems, or  Sore throat,                No sneezing, itching, ear ache, nasal congestion, post nasal drip,   CV:  No chest pain,  Orthopnea, PND, swelling in lower extremities, anasarca, dizziness, palpitations, syncope.   GI  No heartburn, indigestion, abdominal pain, nausea, vomiting, diarrhea, change in bowel habits, loss of appetite, bloody stools.   Resp: No shortness of breath with exertion or at rest.  No excess mucus, no productive cough,  No non-productive cough,  No coughing up of blood.  No change in color of mucus.  No  wheezing.  No chest wall deformity  Skin: no rash or lesions.  GU: no dysuria, change in color of urine, no urgency or frequency.  No flank pain, no hematuria   MS:  No joint pain or swelling.  No decreased range of motion.  No back pain.    Physical Exam  BP (!) 134/92 (BP Location: Left Arm, Cuff Size: Normal)   Ht 5' 7 (1.702 m)   Wt 186 lb 6.4 oz (84.6 kg)   BMI 29.19 kg/m   GEN: A/Ox3; pleasant , NAD, well nourished    HEENT:  [place]/AT,   NOSE-clear, THROAT-clear, no lesions, no postnasal drip or exudate noted. Class 4 MP airway   NECK:  Supple w/ fair ROM; no JVD; normal carotid impulses w/o bruits; no thyromegaly or nodules palpated; no lymphadenopathy.    RESP  Clear  P & A; w/o, wheezes/ rales/ or rhonchi. no accessory muscle use, no dullness to percussion  CARD:  RRR, no m/r/g, no peripheral edema, pulses intact, no cyanosis or clubbing.  GI:   Soft & nt; nml bowel sounds; no organomegaly or masses detected.   Musco: Warm bil, no deformities or joint swelling noted.   Neuro: alert, no focal deficits noted.    Skin: Warm, no lesions or rashes    Lab Results:  CBC    BNP   ProBNP No results found for: PROBNP  Imaging: Sleep Study Documents Result Date: 05/05/2023 Ordered by an unspecified provider.   carfilzomib  (KYPROLIS ) 36 mg in dextrose  5 %  50 mL chemo infusion     Date Action Dose Route User   04/01/2023 1107 Infusion Verify (none) Intravenous Loring Greig DEL, RN   04/01/2023 1107 Rate/Dose Change (none) Intravenous Loring Greig DEL, RN   04/01/2023 1106 New Bag/Given 36 mg Intravenous Loring Greig DEL, RN      carfilzomib  (KYPROLIS ) 36 mg in dextrose  5 % 50 mL chemo infusion     Date Action Dose Route User   04/08/2023 1134 Rate/Dose Change (none) Intravenous Trayer, Carmel L, RN   04/08/2023 1054 New Bag/Given 36 mg Intravenous Trayer, Carmel L, RN      carfilzomib  (KYPROLIS ) 36 mg in dextrose  5 % 50 mL chemo infusion     Date Action Dose Route User   04/22/2023 1026 Infusion Verify (none) Intravenous Trayer, Carmel L, RN   04/22/2023 1025 New Bag/Given 36 mg Intravenous Trayer, Carmel L, RN      carfilzomib  (KYPROLIS ) 36 mg in dextrose  5 % 50 mL chemo infusion     Date Action Dose Route User   04/29/2023 1101 Rate/Dose Change (none) Intravenous Slade, Oneeka S, RN   04/29/2023 1026 Infusion Verify (none) Intravenous Slade, Oneeka S, RN   04/29/2023 1026 Infusion Verify (none) Intravenous Slade, Oneeka S, RN   04/29/2023 1026 Infusion Verify (none) Intravenous Slade, Oneeka S, RN   04/29/2023 1025 Infusion Verify (none) Intravenous Slade, Oneeka S, RN      carfilzomib  (KYPROLIS ) 36 mg in dextrose  5 % 50 mL chemo infusion     Date Action Dose Route User   05/06/2023 1045 Rate/Dose Change (none) Intravenous Chilcott, Loren O, RN   05/06/2023 1008 Infusion Verify (none) Intravenous Chilcott, Loren O, RN   05/06/2023 1008 Rate/Dose Change (none) Intravenous Chilcott, Loren O, RN   05/06/2023 1008 New Bag/Given 36 mg Intravenous Chilcott, Loren O, RN      carfilzomib  (KYPROLIS ) 36 mg in dextrose  5 % 50 mL chemo infusion     Date Action  Dose Route User   05/21/2023 1525 Rate/Dose Change (none) Intravenous Towana Sor, RN   05/21/2023 1524 Rate/Dose Change (none) Intravenous Towana Sor, RN   05/21/2023 1518 Rate/Dose Change  (none) Intravenous Towana Sor, RN   05/21/2023 1517 Rate/Dose Change (none) Intravenous Towana Sor, RN   05/21/2023 1444 New Bag/Given 36 mg Intravenous Towana Sor, RN      carfilzomib  (KYPROLIS ) 36 mg in dextrose  5 % 50 mL chemo infusion     Date Action Dose Route User   05/27/2023 1143 Infusion Verify (none) Intravenous Simser, Marjorie SQUIBB, RN   05/27/2023 1108 Infusion Verify (none) Intravenous Genene Marjorie SQUIBB, RN   05/27/2023 1107 New Bag/Given 36 mg Intravenous Simser, Marjorie SQUIBB, RN      dexamethasone  (DECADRON ) 20 mg in sodium chloride  0.9 % 50 mL IVPB     Date Action Dose Route User   04/22/2023 0936 New Bag/Given 20 mg Intravenous Marcelina Gwendlyn CROME, RN      dexamethasone  (DECADRON ) 20 mg in sodium chloride  0.9 % 50 mL IVPB     Date Action Dose Route User   04/29/2023 0921 Infusion Verify (none) Intravenous Slade, Oneeka S, RN   04/29/2023 9078 New Bag/Given 20 mg Intravenous Slade, Oneeka S, RN      dexamethasone  (DECADRON ) tablet 20 mg     Date Action Dose Route User   05/27/2023 1022 Given 20 mg Oral Simser, Marjorie SQUIBB, RN      dexamethasone  (DECADRON ) injection 10 mg     Date Action Dose Route User   04/01/2023 1018 Given 10 mg Intravenous Loring Greig DEL, RN      dexamethasone  (DECADRON ) injection 10 mg     Date Action Dose Route User   04/08/2023 0951 Given 10 mg Intravenous Trayer, Carmel L, RN      dexamethasone  (DECADRON ) injection 10 mg     Date Action Dose Route User   05/06/2023 0929 Given 10 mg Intravenous Chilcott, Loren O, RN      dexamethasone  (DECADRON ) injection 10 mg     Date Action Dose Route User   05/21/2023 1411 Given 10 mg Intravenous Towana Sor, RN      heparin  lock flush 100 unit/mL     Date Action Dose Route User   04/01/2023 1142 Given 500 Units Intracatheter Elkins, Virginia H, RN      heparin  lock flush 100 unit/mL     Date Action Dose Route User   04/08/2023 1140 Given 500 Units Intracatheter Trayer, Carmel L, RN       heparin  lock flush 100 unit/mL     Date Action Dose Route User   04/22/2023 1107 Given 500 Units Intracatheter Trayer, Carmel L, RN      heparin  lock flush 100 unit/mL     Date Action Dose Route User   04/29/2023 1106 Given 500 Units Intracatheter Slade, Oneeka S, RN      heparin  lock flush 100 unit/mL     Date Action Dose Route User   05/06/2023 1046 Given 500 Units Intracatheter Chilcott, Loren O, RN      heparin  lock flush 100 unit/mL     Date Action Dose Route User   05/21/2023 1528 Given 500 Units Intracatheter Towana Sor, RN      heparin  lock flush 100 unit/mL     Date Action Dose Route User   05/27/2023 1149 Given 500 Units Intracatheter Simser, Kendra P, RN      prochlorperazine  (COMPAZINE ) injection 10 mg     Date Action  Dose Route User   05/21/2023 1309 Given 10 mg Intravenous Towana Sor, RN      0.9 %  sodium chloride  infusion     Date Action Dose Route User   04/01/2023 1138 Rate/Dose Change (none) Intravenous Loring Greig DEL, RN   04/01/2023 1135 Infusion Verify (none) Intravenous Loring Greig DEL, RN   04/01/2023 1135 Infusion Verify (none) Intravenous Loring Greig DEL, RN   04/01/2023 1105 Rate/Dose Change (none) Intravenous Loring Greig DEL, RN   04/01/2023 1104 Rate/Dose Change (none) Intravenous Loring Greig DEL, RN      0.9 %  sodium chloride  infusion     Date Action Dose Route User   04/08/2023 1145 Rate/Dose Change (none) Intravenous Trayer, Carmel L, RN   04/08/2023 1134 Rate/Dose Change (none) Intravenous Trayer, Carmel L, RN   04/08/2023 1131 Rate/Dose Change (none) Intravenous Trayer, Carmel L, RN   04/08/2023 1026 Rate/Dose Change (none) Intravenous Marcelina Gwendlyn CROME, RN   04/08/2023 0957 New Bag/Given (none) Intravenous Marcelina Gwendlyn L, RN      0.9 %  sodium chloride  infusion     Date Action Dose Route User   04/22/2023 1105 Rate/Dose Change (none) Intravenous Marcelina Gwendlyn CROME, RN   04/22/2023 9048 Restarted (none) Intravenous  Marcelina Gwendlyn CROME, RN   04/22/2023 9065 New Bag/Given (none) Intravenous Marcelina Gwendlyn L, RN      0.9 %  sodium chloride  infusion     Date Action Dose Route User   04/29/2023 1102 Rate/Dose Change (none) Intravenous Slade, Oneeka S, RN   04/29/2023 9057 Rate/Dose Change (none) Intravenous Slade, Oneeka S, RN   04/29/2023 0940 Restarted (none) Intravenous Slade, Oneeka S, RN   04/29/2023 0919 New Bag/Given (none) Intravenous Slade, Oneeka S, RN      0.9 %  sodium chloride  infusion     Date Action Dose Route User   04/29/2023 1012 Rate/Dose Change (none) Intravenous Slade, Oneeka S, RN   04/29/2023 1012 Rate/Dose Change (none) Intravenous Slade, Oneeka S, RN   04/29/2023 9057 New Bag/Given (none) Intravenous Slade, Oneeka S, RN      0.9 %  sodium chloride  infusion     Date Action Dose Route User   05/06/2023 1049 Rate/Dose Change (none) Intravenous Chilcott, Loren O, RN   05/06/2023 1045 Rate/Dose Change (none) Intravenous Chilcott, Loren O, RN   05/06/2023 1008 Infusion Verify (none) Intravenous Chilcott, Loren O, RN   05/06/2023 1006 Restarted (none) Intravenous Chilcott, Loren O, RN   05/06/2023 9040 Rate/Dose Change (none) Intravenous Chilcott, Roderick KIDD, RN      0.9 %  sodium chloride  infusion     Date Action Dose Route User   05/21/2023 1407 New Bag/Given (none) Intravenous Towana Sor, RN      0.9 %  sodium chloride  infusion     Date Action Dose Route User   05/21/2023 1408 New Bag/Given (none) Intravenous Towana Sor, RN      0.9 %  sodium chloride  infusion     Date Action Dose Route User   05/27/2023 1027 Rate/Dose Change (none) Intravenous Simser, Marjorie SQUIBB, RN   05/27/2023 1018 Rate/Dose Change (none) Intravenous Simser, Marjorie SQUIBB, RN   05/27/2023 1018 New Bag/Given (none) Intravenous Simser, Marjorie SQUIBB, RN      0.9 %  sodium chloride  infusion     Date Action Dose Route User   05/27/2023 1102 Infusion Verify (none) Intravenous Genene Marjorie SQUIBB, RN   05/27/2023 1101 Rate/Dose  Change (none) Intravenous Genene Marjorie SQUIBB, RN   05/27/2023 1027 New  Bag/Given (none) Intravenous Simser, Kendra P, RN           No data to display          No results found for: NITRICOXIDE      Assessment & Plan:   OSA (obstructive sleep apnea) Very severe complex sleep apnea-with dominant central events.  CPAP titration study showed optimal control at 8 cm H2O.  Unfortunately patient has started CPAP and continues to have significant residual events and some intolerance.  He is on multiple sedating medications and is recently restarted amitriptyline .  Will change CPAP to auto CPAP 8 to 16 cm of H2O.  May need to come down on the upper limits depending on number of his central events.  Will get a CPAP download in 2 weeks.  Will also change to the ResMed F30 I wide mask.   Plan  Patient Instructions  Change CPAP mask to Resmed F30i Change CPAP pressure to 8 to 16cmH2O.  Download in 2 weeks  Do not drive if sleepy Work on healthy weight Use caution with sedating medications.  Follow-up in 6-8 weeks and As needed      Chronic pain syndrome Chronic pain syndrome-patient is continue follow-up with pain management.  Use caution with sedating medications.     Madelin Stank, NP 05/29/2023

## 2023-05-29 NOTE — Telephone Encounter (Signed)
 Check DL in 2 weeks.

## 2023-05-29 NOTE — Assessment & Plan Note (Signed)
 Chronic pain syndrome-patient is continue follow-up with pain management.  Use caution with sedating medications.

## 2023-05-29 NOTE — Assessment & Plan Note (Signed)
 Very severe complex sleep apnea-with dominant central events.  CPAP titration study showed optimal control at 8 cm H2O.  Unfortunately patient has started CPAP and continues to have significant residual events and some intolerance.  He is on multiple sedating medications and is recently restarted amitriptyline .  Will change CPAP to auto CPAP 8 to 16 cm of H2O.  May need to come down on the upper limits depending on number of his central events.  Will get a CPAP download in 2 weeks.  Will also change to the ResMed F30 I wide mask.   Plan  Patient Instructions  Change CPAP mask to Resmed F30i Change CPAP pressure to 8 to 16cmH2O.  Download in 2 weeks  Do not drive if sleepy Work on healthy weight Use caution with sedating medications.  Follow-up in 6-8 weeks and As needed

## 2023-05-29 NOTE — Patient Instructions (Addendum)
 Change CPAP mask to Resmed F30i Change CPAP pressure to 8 to 16cmH2O.  Download in 2 weeks  Do not drive if sleepy Work on healthy weight Use caution with sedating medications.  Follow-up in 6-8 weeks and As needed

## 2023-05-30 ENCOUNTER — Telehealth: Payer: Self-pay | Admitting: Pharmacy Technician

## 2023-05-30 ENCOUNTER — Other Ambulatory Visit: Payer: Self-pay | Admitting: Pharmacist

## 2023-05-30 ENCOUNTER — Other Ambulatory Visit (HOSPITAL_COMMUNITY): Payer: Self-pay

## 2023-05-30 DIAGNOSIS — C9002 Multiple myeloma in relapse: Secondary | ICD-10-CM

## 2023-05-30 MED ORDER — POMALIDOMIDE 2 MG PO CAPS: 2.0000 mg | ORAL_CAPSULE | Freq: Every day | ORAL | 0 refills | Status: DC

## 2023-05-30 NOTE — Telephone Encounter (Signed)
 [name]  Was successful in securing patient a $12000 grant from [facility] to provide copayment coverage for Pomalyst .  This will keep the out of pocket expense at $0.     [employer] ID: [id number]  The billing information is as follows and has been shared with [facility].    RxBin: [id number] PCN: [id number] Member ID: [id number] Group ID: [id number] Dates of Eligibility: 04/30/2023 through 04/28/2024  Fund:  Multiple Myeloma - Medicare Access  [facility]

## 2023-05-30 NOTE — Telephone Encounter (Signed)
 Script and all supporting documents sent to [facility] for processing   [facility]

## 2023-06-03 ENCOUNTER — Other Ambulatory Visit: Payer: Medicare HMO

## 2023-06-03 ENCOUNTER — Ambulatory Visit: Payer: Medicare HMO | Admitting: Oncology

## 2023-06-03 ENCOUNTER — Other Ambulatory Visit: Payer: Self-pay | Admitting: Primary Care

## 2023-06-03 ENCOUNTER — Ambulatory Visit: Payer: Medicare HMO

## 2023-06-03 ENCOUNTER — Inpatient Hospital Stay: Payer: Medicare HMO

## 2023-06-03 VITALS — BP 142/91 | HR 99 | Temp 98.0°F | Resp 20

## 2023-06-03 DIAGNOSIS — Z5112 Encounter for antineoplastic immunotherapy: Secondary | ICD-10-CM | POA: Diagnosis not present

## 2023-06-03 DIAGNOSIS — C9002 Multiple myeloma in relapse: Secondary | ICD-10-CM

## 2023-06-03 DIAGNOSIS — R519 Headache, unspecified: Secondary | ICD-10-CM

## 2023-06-03 LAB — CBC WITH DIFFERENTIAL (CANCER CENTER ONLY)
Abs Immature Granulocytes: 0.13 10*3/uL — ABNORMAL HIGH (ref 0.00–0.07)
Basophils Absolute: 0.1 10*3/uL (ref 0.0–0.1)
Basophils Relative: 1 %
Eosinophils Absolute: 0.3 10*3/uL (ref 0.0–0.5)
Eosinophils Relative: 3 %
HCT: 43 % (ref 39.0–52.0)
Hemoglobin: 15 g/dL (ref 13.0–17.0)
Immature Granulocytes: 1 %
Lymphocytes Relative: 15 %
Lymphs Abs: 1.5 10*3/uL (ref 0.7–4.0)
MCH: 32.3 pg (ref 26.0–34.0)
MCHC: 34.9 g/dL (ref 30.0–36.0)
MCV: 92.5 fL (ref 80.0–100.0)
Monocytes Absolute: 0.4 10*3/uL (ref 0.1–1.0)
Monocytes Relative: 5 %
Neutro Abs: 7.4 10*3/uL (ref 1.7–7.7)
Neutrophils Relative %: 75 %
Platelet Count: 201 10*3/uL (ref 150–400)
RBC: 4.65 MIL/uL (ref 4.22–5.81)
RDW: 14.1 % (ref 11.5–15.5)
WBC Count: 9.8 10*3/uL (ref 4.0–10.5)
nRBC: 0 % (ref 0.0–0.2)

## 2023-06-03 LAB — CMP (CANCER CENTER ONLY)
ALT: 35 U/L (ref 0–44)
AST: 41 U/L (ref 15–41)
Albumin: 3.6 g/dL (ref 3.5–5.0)
Alkaline Phosphatase: 84 U/L (ref 38–126)
Anion gap: 10 (ref 5–15)
BUN: 14 mg/dL (ref 6–20)
CO2: 23 mmol/L (ref 22–32)
Calcium: 8.8 mg/dL — ABNORMAL LOW (ref 8.9–10.3)
Chloride: 104 mmol/L (ref 98–111)
Creatinine: 1.48 mg/dL — ABNORMAL HIGH (ref 0.61–1.24)
GFR, Estimated: 57 mL/min — ABNORMAL LOW (ref >60)
Glucose, Bld: 167 mg/dL — ABNORMAL HIGH (ref 70–99)
Potassium: 3.7 mmol/L (ref 3.5–5.1)
Sodium: 137 mmol/L (ref 135–145)
Total Bilirubin: 1.1 mg/dL (ref 0.0–1.2)
Total Protein: 6.1 g/dL — ABNORMAL LOW (ref 6.5–8.1)

## 2023-06-03 MED ORDER — SODIUM CHLORIDE 0.9 % IV SOLN
Freq: Once | INTRAVENOUS | Status: AC
  Filled 2023-06-03: qty 250

## 2023-06-03 MED ORDER — DEXTROSE 5 % IV SOLN
20.0000 mg/m2 | Freq: Once | INTRAVENOUS | Status: AC
  Administered 2023-06-03: 36 mg via INTRAVENOUS
  Filled 2023-06-03: qty 13

## 2023-06-03 MED ORDER — HEPARIN SOD (PORK) LOCK FLUSH 100 UNIT/ML IV SOLN
500.0000 [IU] | Freq: Once | INTRAVENOUS | Status: AC | PRN
  Administered 2023-06-03: 500 [IU]
  Filled 2023-06-03: qty 5

## 2023-06-03 MED ORDER — SODIUM CHLORIDE 0.9% FLUSH
10.0000 mL | INTRAVENOUS | Status: DC | PRN
  Filled 2023-06-03: qty 10

## 2023-06-03 MED ORDER — DEXAMETHASONE 4 MG PO TABS
20.0000 mg | ORAL_TABLET | Freq: Once | ORAL | Status: AC
  Administered 2023-06-03: 20 mg via ORAL
  Filled 2023-06-03: qty 5

## 2023-06-03 NOTE — Patient Instructions (Signed)
[facility] - A DEPT OF [facility]  Discharge Instructions: Thank you for choosing [facility] to provide your oncology and hematology care.  If you have a lab appointment with the Cancer Center, please go directly to the Cancer Center and check in at the registration area.  Wear comfortable clothing and clothing appropriate for easy access to any Portacath or PICC line.   We strive to give you quality time with your provider. You may need to reschedule your appointment if you arrive late (15 or more minutes).  Arriving late affects you and other patients whose appointments are after yours.  Also, if you miss three or more appointments without notifying the office, you may be dismissed from the clinic at the provider's discretion.      For prescription refill requests, have your pharmacy contact our office and allow 72 hours for refills to be completed.    Today you received the following chemotherapy and/or immunotherapy agents kyprolis      To help prevent nausea and vomiting after your treatment, we encourage you to take your nausea medication as directed.  BELOW ARE SYMPTOMS THAT SHOULD BE REPORTED IMMEDIATELY: *FEVER GREATER THAN 100.4 F (38 C) OR HIGHER *CHILLS OR SWEATING *NAUSEA AND VOMITING THAT IS NOT CONTROLLED WITH YOUR NAUSEA MEDICATION *UNUSUAL SHORTNESS OF BREATH *UNUSUAL BRUISING OR BLEEDING *URINARY PROBLEMS (pain or burning when urinating, or frequent urination) *BOWEL PROBLEMS (unusual diarrhea, constipation, pain near the anus) TENDERNESS IN MOUTH AND THROAT WITH OR WITHOUT PRESENCE OF ULCERS (sore throat, sores in mouth, or a toothache) UNUSUAL RASH, SWELLING OR PAIN  UNUSUAL VAGINAL DISCHARGE OR ITCHING   Items with * indicate a potential emergency and should be followed up as soon as possible or go to the Emergency Department if any problems should occur.  Please show the CHEMOTHERAPY ALERT CARD or IMMUNOTHERAPY  ALERT CARD at check-in to the Emergency Department and triage nurse.  Should you have questions after your visit or need to cancel or reschedule your appointment, please contact [facility] - A DEPT OF [facility]  [phone] and follow the prompts.  Office hours are 8:00 a.m. to 4:30 p.m. Monday - Friday. Please note that voicemails left after 4:00 p.m. may not be returned until the following business day.  We are closed weekends and major holidays. You have access to a nurse at all times for urgent questions. Please call the main number to the clinic [phone] and follow the prompts.  For any non-urgent questions, you may also contact your provider using MyChart. We now offer e-Visits for anyone 63 and older to request care online for non-urgent symptoms. For details visit [email or web address].   Also download the MyChart app! Go to the app store, search "MyChart", open the app, select [facility], and log in with your MyChart username and password.

## 2023-06-03 NOTE — Telephone Encounter (Unsigned)
Copied from CRM [id number]. Topic: Clinical - Medication Refill >> Jun 03, 2023  5:28 PM Denese Killings wrote: Most Recent Primary Care Visit:  Provider: Doreene Nest  Department: [facility]  Visit Type: OFFICE VISIT  Date: 12/25/2022  Medication: amitriptyline (ELAVIL) 25 MG tablet 90-day supply  Has the patient contacted their pharmacy? yes (Agent: If no, request that the patient contact the pharmacy for the refill. If patient does not wish to contact the pharmacy document the reason why and proceed with request.) (Agent: If yes, when and what did the pharmacy advise?)  Is this the correct pharmacy for this prescription? Yes If no, delete pharmacy and type the correct one.  This is the patient's preferred pharmacy:  [facility] [place] [address] [place] [address] Phone: [phone] Fax: [phone]  Has the prescription been filled recently? No last fill 11/22  Is the patient out of the medication? No  Has the patient been seen for an appointment in the last year OR does the patient have an upcoming appointment? Yes  Can we respond through MyChart? Yes  Agent: Please be advised that Rx refills may take up to 3 business days. We ask that you follow-up with your pharmacy.

## 2023-06-03 NOTE — Telephone Encounter (Signed)
Last Fill: 05/20/23  Last OV: Unk  Next OV: Unk  Routing to provider for review/authorization.

## 2023-06-04 ENCOUNTER — Other Ambulatory Visit: Payer: Self-pay | Admitting: Oncology

## 2023-06-04 ENCOUNTER — Other Ambulatory Visit: Payer: Self-pay

## 2023-06-04 ENCOUNTER — Encounter: Payer: Self-pay | Admitting: Oncology

## 2023-06-04 ENCOUNTER — Telehealth: Payer: Self-pay | Admitting: *Deleted

## 2023-06-04 ENCOUNTER — Other Ambulatory Visit: Payer: Self-pay | Admitting: *Deleted

## 2023-06-04 ENCOUNTER — Other Ambulatory Visit: Payer: Self-pay | Admitting: Primary Care

## 2023-06-04 DIAGNOSIS — T451X5A Adverse effect of antineoplastic and immunosuppressive drugs, initial encounter: Secondary | ICD-10-CM

## 2023-06-04 DIAGNOSIS — M792 Neuralgia and neuritis, unspecified: Secondary | ICD-10-CM

## 2023-06-04 MED ORDER — OXYCODONE HCL 15 MG PO TABS
15.0000 mg | ORAL_TABLET | Freq: Three times a day (TID) | ORAL | 0 refills | Status: DC | PRN
  Filled 2023-06-04: qty 90, 30d supply, fill #0

## 2023-06-04 NOTE — Telephone Encounter (Signed)
Patient called requesting a refill of his oxycodone and flexeril.  Per Dr. Smith Robert ok to refill oxycodone but Flexeril not recommended for long term use.  Tried to call patient back to explain but his number says it is not in order at this time.

## 2023-06-06 ENCOUNTER — Other Ambulatory Visit: Payer: Self-pay

## 2023-06-06 ENCOUNTER — Encounter: Payer: Self-pay | Admitting: Oncology

## 2023-06-10 ENCOUNTER — Ambulatory Visit: Payer: Medicare HMO

## 2023-06-10 ENCOUNTER — Other Ambulatory Visit: Payer: Medicare HMO

## 2023-06-11 NOTE — Telephone Encounter (Signed)
Printed out DL and placed on James House's desk for review.

## 2023-06-13 ENCOUNTER — Other Ambulatory Visit: Payer: Self-pay

## 2023-06-15 DIAGNOSIS — G4733 Obstructive sleep apnea (adult) (pediatric): Secondary | ICD-10-CM | POA: Diagnosis not present

## 2023-06-17 ENCOUNTER — Inpatient Hospital Stay: Payer: Medicare HMO

## 2023-06-17 ENCOUNTER — Encounter: Payer: Self-pay | Admitting: Oncology

## 2023-06-17 ENCOUNTER — Inpatient Hospital Stay (HOSPITAL_BASED_OUTPATIENT_CLINIC_OR_DEPARTMENT_OTHER): Payer: Medicare HMO | Admitting: Oncology

## 2023-06-17 VITALS — BP 136/98 | HR 93 | Temp 99.2°F | Resp 18 | Wt 183.3 lb

## 2023-06-17 VITALS — BP 139/91 | HR 88

## 2023-06-17 DIAGNOSIS — Z79899 Other long term (current) drug therapy: Secondary | ICD-10-CM

## 2023-06-17 DIAGNOSIS — C9002 Multiple myeloma in relapse: Secondary | ICD-10-CM

## 2023-06-17 DIAGNOSIS — E876 Hypokalemia: Secondary | ICD-10-CM | POA: Diagnosis not present

## 2023-06-17 DIAGNOSIS — Z5111 Encounter for antineoplastic chemotherapy: Secondary | ICD-10-CM

## 2023-06-17 DIAGNOSIS — Z5112 Encounter for antineoplastic immunotherapy: Secondary | ICD-10-CM | POA: Diagnosis not present

## 2023-06-17 LAB — CMP (CANCER CENTER ONLY)
ALT: 22 U/L (ref 0–44)
AST: 28 U/L (ref 15–41)
Albumin: 3.3 g/dL — ABNORMAL LOW (ref 3.5–5.0)
Alkaline Phosphatase: 69 U/L (ref 38–126)
Anion gap: 9 (ref 5–15)
BUN: 8 mg/dL (ref 6–20)
CO2: 24 mmol/L (ref 22–32)
Calcium: 8.6 mg/dL — ABNORMAL LOW (ref 8.9–10.3)
Chloride: 104 mmol/L (ref 98–111)
Creatinine: 1.36 mg/dL — ABNORMAL HIGH (ref 0.61–1.24)
GFR, Estimated: >60 mL/min (ref >60)
Glucose, Bld: 134 mg/dL — ABNORMAL HIGH (ref 70–99)
Potassium: 3.1 mmol/L — ABNORMAL LOW (ref 3.5–5.1)
Sodium: 137 mmol/L (ref 135–145)
Total Bilirubin: 0.7 mg/dL (ref 0.0–1.2)
Total Protein: 6.2 g/dL — ABNORMAL LOW (ref 6.5–8.1)

## 2023-06-17 LAB — CBC WITH DIFFERENTIAL (CANCER CENTER ONLY)
Abs Immature Granulocytes: 0.03 10*3/uL (ref 0.00–0.07)
Basophils Absolute: 0 10*3/uL (ref 0.0–0.1)
Basophils Relative: 0 %
Eosinophils Absolute: 0.2 10*3/uL (ref 0.0–0.5)
Eosinophils Relative: 4 %
HCT: 37.1 % — ABNORMAL LOW (ref 39.0–52.0)
Hemoglobin: 13 g/dL (ref 13.0–17.0)
Immature Granulocytes: 1 %
Lymphocytes Relative: 15 %
Lymphs Abs: 1 10*3/uL (ref 0.7–4.0)
MCH: 32.2 pg (ref 26.0–34.0)
MCHC: 35 g/dL (ref 30.0–36.0)
MCV: 91.8 fL (ref 80.0–100.0)
Monocytes Absolute: 0.4 10*3/uL (ref 0.1–1.0)
Monocytes Relative: 6 %
Neutro Abs: 4.9 10*3/uL (ref 1.7–7.7)
Neutrophils Relative %: 74 %
Platelet Count: 280 10*3/uL (ref 150–400)
RBC: 4.04 MIL/uL — ABNORMAL LOW (ref 4.22–5.81)
RDW: 13.4 % (ref 11.5–15.5)
WBC Count: 6.5 10*3/uL (ref 4.0–10.5)
nRBC: 0 % (ref 0.0–0.2)

## 2023-06-17 MED ORDER — DEXTROSE 5 % IV SOLN
20.0000 mg/m2 | Freq: Once | INTRAVENOUS | Status: AC
  Administered 2023-06-17: 36 mg via INTRAVENOUS
  Filled 2023-06-17: qty 15

## 2023-06-17 MED ORDER — DEXAMETHASONE 4 MG PO TABS
20.0000 mg | ORAL_TABLET | Freq: Once | ORAL | Status: AC
  Administered 2023-06-17: 20 mg via ORAL
  Filled 2023-06-17: qty 5

## 2023-06-17 MED ORDER — HEPARIN SOD (PORK) LOCK FLUSH 100 UNIT/ML IV SOLN
500.0000 [IU] | Freq: Once | INTRAVENOUS | Status: AC | PRN
  Administered 2023-06-17: 500 [IU]
  Filled 2023-06-17: qty 5

## 2023-06-17 MED ORDER — POTASSIUM CHLORIDE 20 MEQ/100ML IV SOLN
20.0000 meq | Freq: Once | INTRAVENOUS | Status: AC
  Administered 2023-06-17: 20 meq via INTRAVENOUS

## 2023-06-17 MED ORDER — SODIUM CHLORIDE 0.9 % IV SOLN
Freq: Once | INTRAVENOUS | Status: AC
  Filled 2023-06-17: qty 250

## 2023-06-17 MED ORDER — SODIUM CHLORIDE 0.9 % IV SOLN
Freq: Once | INTRAVENOUS | Status: AC
Start: 2023-06-17 — End: 2023-06-17
  Filled 2023-06-17: qty 250

## 2023-06-17 NOTE — Telephone Encounter (Signed)
 Download shows pressure change did not help with the number of sleep apneic events.  Recommend to change back to CPAP set pressure at 10 cm H2O.  Please set up office visit with Dr. Vassie Loll or Dr. Wynona Neat  for an evaluation

## 2023-06-17 NOTE — Patient Instructions (Signed)
 [facility]  Discharge Instructions: Thank you for choosing [facility] to provide your oncology and hematology care.  If you have a lab appointment with the Cancer Center, please go directly to the Cancer Center and check in at the registration area.  Wear comfortable clothing and clothing appropriate for easy access to any Portacath or PICC line.   We strive to give you quality time with your provider. You may need to reschedule your appointment if you arrive late (15 or more minutes).  Arriving late affects you and other patients whose appointments are after yours.  Also, if you miss three or more appointments without notifying the office, you may be dismissed from the clinic at the provider's discretion.      For prescription refill requests, have your pharmacy contact our office and allow 72 hours for refills to be completed.     To help prevent nausea and vomiting after your treatment, we encourage you to take your nausea medication as directed.  BELOW ARE SYMPTOMS THAT SHOULD BE REPORTED IMMEDIATELY: *FEVER GREATER THAN 100.4 F (38 C) OR HIGHER *CHILLS OR SWEATING *NAUSEA AND VOMITING THAT IS NOT CONTROLLED WITH YOUR NAUSEA MEDICATION *UNUSUAL SHORTNESS OF BREATH *UNUSUAL BRUISING OR BLEEDING *URINARY PROBLEMS (pain or burning when urinating, or frequent urination) *BOWEL PROBLEMS (unusual diarrhea, constipation, pain near the anus) TENDERNESS IN MOUTH AND THROAT WITH OR WITHOUT PRESENCE OF ULCERS (sore throat, sores in mouth, or a toothache) UNUSUAL RASH, SWELLING OR PAIN   Items with * indicate a potential emergency and should be followed up as soon as possible or go to the Emergency Department if any problems should occur.  Please show the CHEMOTHERAPY ALERT CARD or IMMUNOTHERAPY ALERT CARD at check-in to the Emergency Department and triage nurse.  Should you have questions after your visit or need to  cancel or reschedule your appointment, please contact [facility] - A DEPT OF [facility]ideg[facility]  [phone] and follow the prompts.  Office hours are 8:00 a.m. to 4:30 p.m. Monday - Friday. Please note that voicemails left after 4:00 p.m. may not be returned until the following business day.  We are closed weekends and major holidays. You have access to a nurse at all times for urgent questions. Please call the main number to the clinic [phone] and follow the prompts.  For any non-urgent questions, you may also contact your provider using MyChart. We now offer e-Visits for anyone 64 and older to request care online for non-urgent symptoms. For details visit [email or web address].   Also download the MyChart app! Go to the app store, search "MyChart", open the app, select [facility], and log in with your MyChart username and password.

## 2023-06-17 NOTE — Progress Notes (Signed)
 Hematology/Oncology Consult note [facility]  [phone] [phone]  Patient Care Team: Doreene Nest, NP as PCP - General (Internal Medicine) Martin Majestic, MD as PCP - Hematology/Oncology Debbe Odea, MD as PCP - Cardiology (Cardiology) Creig Hines, MD as Consulting Physician (Hematology and Oncology)   Name of the patient: James House  [id number]  [birth year]   Date of visit: 06/17/23  Diagnosis- recurrent kappa light chain multiple myeloma in relapse   Chief complaint/ Reason for visit-on treatment assessment prior to cycle 12-day 1 of Kyprolis  Heme/Onc history: Patient is a 53- year-old male with a history of kappa light chain myeloma that was treated by [name].  History is as follows:   1.  Patient presented with renal insufficiency with a creatinine of 2.4 in April 2018.  Kidney biopsy showed myeloma cast nephropathy.  Kappa light chain was 3004 and lambda 0.22 with a kappa lambda ratio of 13,000 655.  Skeletal survey showed multiple calvarial and marrow lesions.  Bone marrow biopsy in May 2018 showed 43% plasma cells.  He received CyBorD for cycle 1 in May 2018 and was subsequently switched to RVD.  He received 4 cycles followed by a repeat bone marrow biopsy in August 2018 which showed no increase in plasma cells.   2.  He underwent autologous stem cell transplantation on 01/30/2017.  He was started on Revlimid maintenance 10 mg daily in January 2019.   3.  Labs from January February and March 2019 done at [facility] showed no M spike, normal kappa lambda ratio of 1.06.  He was last seen by them in April 2019.   4. Following that patient was admitted to [facility] for healthcare associated pneumonia and was recently discharged.  He wished to transfer his care to [place] at this time.  He was on maintenance Revlimid 5 mg which was then reduced to 2.5 mg.  Follows up with pain clinic for chemo-induced peripheral neuropathy with  [name].  His urine tested positive for [facility] and therefore patient was discharged from pain clinic   5.  Noted to have gradually increasing free kappa light chainFrom 15 in 20 21-39.5 with a ratio of 3.09.  He was seen by [facility] again and underwent a repeat bone marrow biopsy which did not show any evidence of clonal plasma cells.  PET CT scan also did not show any new active lytic lesions in May 2023.Marland Kitchen  Revlimid dose was increased to 15 mg 3 weeks on 1 week off but kappa light chains show continuing to increase   6.  PET CT scan in December 2023 did not show any evidence of active myeloma.  Patient underwent repeat [facility] which showed 6% plasma cellsConsistent with persistent plasma cell neoplasm.  74 to 86% of isolated plasma cells show loss of T p53 and monosomy 13.  Patient was seen by [name] at [facility] and was recommended Darzalex Pomalyst dexamethasone regimen   7.  Disease progression after 8 weekly cycles of Darazalex kappa light chain increased from 332-646 with a ratio that went up from 39-57 concerning for disease progression.  Patient switched to carfilzomib Pomalyst dexamethasone regimen    Interval history-patient states that he had problems getting Pomalyst from the pharmacy since December 2024 and therefore did not take his medications for 2 months.  He now has the medication and plans to restart it with this cycle.  He has never mentioned this to [place] before  during any of his clinic visits  ECOG PS- 2 Pain scale- 5 Opioid associated constipation- no  Review of systems- Review of Systems  Constitutional:  Positive for malaise/fatigue. Negative for chills, fever and weight loss.  HENT:  Negative for congestion, ear discharge and nosebleeds.   Eyes:  Negative for blurred vision.  Respiratory:  Negative for cough, hemoptysis, sputum production, shortness of breath and wheezing.   Cardiovascular:  Negative for chest pain, palpitations, orthopnea and claudication.   Gastrointestinal:  Negative for abdominal pain, blood in stool, constipation, diarrhea, heartburn, melena, nausea and vomiting.  Genitourinary:  Negative for dysuria, flank pain, frequency, hematuria and urgency.  Musculoskeletal:  Positive for back pain and myalgias. Negative for joint pain.  Skin:  Negative for rash.  Neurological:  Negative for dizziness, tingling, focal weakness, seizures, weakness and headaches.  Endo/Heme/Allergies:  Does not bruise/bleed easily.  Psychiatric/Behavioral:  Negative for depression and suicidal ideas. The patient does not have insomnia.       Allergies  Allergen Reactions   Morphine Nausea And Vomiting     Past Medical History:  Diagnosis Date   CKD (chronic kidney disease) stage 3, GFR 30-59 ml/min (HCC)    Elevated sedimentation rate 12/24/2017   Hypertension    Multiple myeloma (HCC) 08/11/2017   Neuropathy    Pneumonia    Severe sepsis (HCC) 08/11/2017   Shingles 08/03/2019     Past Surgical History:  Procedure Laterality Date   ABDOMINAL SURGERY     COLONOSCOPY WITH PROPOFOL N/A 05/25/2021   Procedure: COLONOSCOPY WITH PROPOFOL;  Surgeon: Toney Reil, MD;  Location: [facility] ENDOSCOPY;  Service: Gastroenterology;  Laterality: N/A;   IR IMAGING GUIDED PORT INSERTION  08/02/2022   JOINT REPLACEMENT     right knee   KNEE SURGERY Left     Social History   Socioeconomic History   Marital status: Single    Spouse name: [name]   Number of children: 3   Years of education: Not on file   Highest education level: Not on file  Occupational History   Not on file  Tobacco Use   Smoking status: Former   Smokeless tobacco: Current    Types: Snuff  Vaping Use   Vaping status: Never Used  Substance and Sexual Activity   Alcohol use: No   Drug use: No   Sexual activity: Yes  Other Topics Concern   Not on file  Social History Narrative   Live in private residence with spouse and mother in law   Social Drivers of Health    Financial Resource Strain: Low Risk  (08/12/2017)   Overall Financial Resource Strain (CARDIA)    Difficulty of Paying Living Expenses: Not hard at all  Food Insecurity: No Food Insecurity (08/12/2017)   Hunger Vital Sign    Worried About Running Out of Food in the Last Year: Never true    Ran Out of Food in the Last Year: Never true  Transportation Needs: No Transportation Needs (08/12/2017)   PRAPARE - Administrator, Civil Service (Medical): No    Lack of Transportation (Non-Medical): No  Physical Activity: Sufficiently Active (08/12/2017)   Exercise Vital Sign    Days of Exercise per Week: 7 days    Minutes of Exercise per Session: 30 min  Stress: No Stress Concern Present (08/12/2017)   [employer] of Occupational Health - Occupational Stress Questionnaire    Feeling of Stress : Only a little  Social Connections: Somewhat Isolated (08/12/2017)  Social Advertising account executive [NHANES]    Frequency of Communication with Friends and Family: Once a week    Frequency of Social Gatherings with Friends and Family: Once a week    Attends Religious Services: More than 4 times per year    Active Member of [employer] or Organizations: No    Attends Banker Meetings: Never    Marital Status: Married  Catering manager Violence: Not At Risk (08/12/2017)   Humiliation, Afraid, Rape, and Kick questionnaire    Fear of Current or Ex-Partner: No    Emotionally Abused: No    Physically Abused: No    Sexually Abused: No    Family History  Problem Relation Age of Onset   Heart disease Mother    Cancer Mother 30       Pancreatic   COPD Mother    Diabetes Mother    Hyperlipidemia Mother    Hypertension Mother    Cancer Maternal Uncle        pancreatic   Heart disease Maternal Grandmother    Cancer Maternal Grandmother 16       colon   COPD Maternal Grandmother    Diabetes Maternal Grandmother    Hypertension Maternal Grandmother      Current Outpatient  Medications:    acyclovir (ZOVIRAX) 400 MG tablet, Take 1 tablet (400 mg total) by mouth 2 (two) times daily., Disp: 60 tablet, Rfl: 2   albuterol (VENTOLIN HFA) 108 (90 Base) MCG/ACT inhaler, Inhale 2 puffs into the lungs every 6 (six) hours as needed for wheezing or shortness of breath., Disp: 6.7 g, Rfl: 6   amitriptyline (ELAVIL) 25 MG tablet, Take 1 tablet (25 mg total) by mouth at bedtime. For headache prevention and sleep, Disp: 90 tablet, Rfl: 1   amLODipine-benazepril (LOTREL) 10-40 MG capsule, Take 1 capsule by mouth daily. for blood pressure., Disp: 90 capsule, Rfl: 1   aspirin EC 81 MG tablet, Take by mouth., Disp: , Rfl:    baclofen (LIORESAL) 10 MG tablet, Take 1 tablet (10 mg total) by mouth 4 (four) times daily as needed for muscle spasms., Disp: 360 tablet, Rfl: 1   carvedilol (COREG) 12.5 MG tablet, Take 1 tablet (12.5 mg total) by mouth 2 (two) times daily., Disp: 180 tablet, Rfl: 1   celecoxib (CELEBREX) 200 MG capsule, Take 1 capsule (200 mg total) by mouth daily., Disp: 30 capsule, Rfl: 2   cyanocobalamin (VITAMIN B12) 1000 MCG/ML injection, Inject 1 mL (1,000 mcg total) into the muscle every 30 (thirty) days., Disp: 1 mL, Rfl: 11   cyclobenzaprine (FLEXERIL) 5 MG tablet, Take 1 tablet (5 mg total) by mouth 2 (two) times daily., Disp: 14 tablet, Rfl: 0   DULoxetine (CYMBALTA) 60 MG capsule, Take 1 capsule (60 mg total) by mouth daily., Disp: 60 capsule, Rfl: 1   levothyroxine (SYNTHROID) 50 MCG tablet, Take 1 tablet (50 mcg total) by mouth daily before breakfast., Disp: 30 tablet, Rfl: 2   lidocaine-prilocaine (EMLA) cream, Apply 1 Application topically as needed., Disp: 30 g, Rfl: 0   methocarbamol (ROBAXIN-750) 750 MG tablet, Take 1 tablet (750 mg total) by mouth 3 (three) times daily., Disp: 90 tablet, Rfl: 0   montelukast (SINGULAIR) 10 MG tablet, Take 1 tablet in the evening 1 day before your treatment. Day of treatment take 1 tablet in the morning for the first 2 treatments  of Daratumumab, Disp: 4 tablet, Rfl: 0   OLANZapine (ZYPREXA) 10 MG tablet, Take 1 tablet (10  mg total) by mouth at bedtime. To help with nausea prevention., Disp: 30 tablet, Rfl: 1   oxyCODONE (ROXICODONE) 15 MG immediate release tablet, Take 1 tablet (15 mg total) by mouth every 8 (eight) hours as needed for pain., Disp: 90 tablet, Rfl: 0   oxyCODONE ER (XTAMPZA ER) 9 MG C12A, Take 9 mg by mouth every 12 (twelve) hours., Disp: 60 capsule, Rfl: 0   pomalidomide (POMALYST) 2 MG capsule, Take 1 capsule (2 mg total) by mouth daily. Take for 21 days, then hold for 7 days. Repeat every 28 days., Disp: 21 capsule, Rfl: 0   potassium chloride SA (KLOR-CON M) 20 MEQ tablet, Take 2 tablets (40 mEq total) by mouth 2 (two) times daily., Disp: 120 tablet, Rfl: 0   pregabalin (LYRICA) 300 MG capsule, TAKE ONE CAPSULE (300MG  TOTAL) BY MOUTH TWICE DAILY, Disp: 180 capsule, Rfl: 0   prochlorperazine (COMPAZINE) 10 MG tablet, Take by mouth every 8 (eight) hours as needed., Disp: , Rfl:   Physical exam:  Vitals:   06/17/23 0856 06/17/23 0857  BP: (!) 130/103 (!) 136/98  Pulse: 93   Resp: 18   Temp: 99.2 F (37.3 C)   TempSrc: Tympanic   SpO2: 100%   Weight: 183 lb 4.8 oz (83.1 kg)    Physical Exam Cardiovascular:     Rate and Rhythm: Normal rate and regular rhythm.     Heart sounds: Normal heart sounds.  Pulmonary:     Effort: Pulmonary effort is normal.     Breath sounds: Normal breath sounds.  Musculoskeletal:     Right lower leg: No edema.     Left lower leg: No edema.  Skin:    General: Skin is warm and dry.  Neurological:     Mental Status: He is alert and oriented to person, place, and time.         Latest Ref Rng & Units 06/17/2023    8:34 AM  CMP  Glucose 70 - 99 mg/dL 161   BUN 6 - 20 mg/dL 8   Creatinine 0.96 - 0.45 mg/dL 4.09   Sodium 811 - 914 mmol/L 137   Potassium 3.5 - 5.1 mmol/L 3.1   Chloride 98 - 111 mmol/L 104   CO2 22 - 32 mmol/L 24   Calcium 8.9 - 10.3 mg/dL 8.6    Total Protein 6.5 - 8.1 g/dL 6.2   Total Bilirubin 0.0 - 1.2 mg/dL 0.7   Alkaline Phos 38 - 126 U/L 69   AST 15 - 41 U/L 28   ALT 0 - 44 U/L 22       Latest Ref Rng & Units 06/17/2023    8:34 AM  CBC  WBC 4.0 - 10.5 K/uL 6.5   Hemoglobin 13.0 - 17.0 g/dL 78.2   Hematocrit 95.6 - 52.0 % 37.1   Platelets 150 - 400 K/uL 280     Assessment and plan- Patient is a 53 y.o. male with history of kappa light chain multiple myeloma in relapse.  He is here for on treatment assessment prior to cycle 12-day 1 of carfilzomib and Pomalyst  Counts okay to proceed with cycle 12-day 1 of carfilzomib today.  He will directly proceed for cycle 8 next week and will be seen by covering provider in 2 weeks for cycle 12-day 15.  I will see him back for cycle 14-day 8.  Patient knows that he should tell us if he does not have Pomalyst from the pharmacy as this is being  coordinated between the patient and the pharmacy.  It is possible that his myeloma numbers were going up because he was not taking his Pomalyst.  Since he just started taking Pomalyst after a gap of 2 months we will have to wait for another 2 months to see if numbers are improving or worsening.  Blood pressure is better controlled today.  He is concerned about his ongoing weight gain but that does not seem to be from fluids.  Chronic back pain and peripheral neuropathy pain: Continue as needed oxycodone and Xtampza  Continue aspirin and acyclovir prophylaxis  Hypokalemia: Continue oral potassium.  Patient has not been compliant with this   Visit Diagnosis 1. Encounter for antineoplastic chemotherapy   2. High risk medication use   3. Multiple myeloma in relapse (HCC)   4. Hypokalemia      Dr. Owens Shark, MD, MPH [facility] [phone] 06/17/2023 12:24 PM

## 2023-06-18 ENCOUNTER — Other Ambulatory Visit: Payer: Self-pay

## 2023-06-18 LAB — KAPPA/LAMBDA LIGHT CHAINS
Kappa free light chain: 61.7 mg/L — ABNORMAL HIGH (ref 3.3–19.4)
Kappa, lambda light chain ratio: 8.23 — ABNORMAL HIGH (ref 0.26–1.65)
Lambda free light chains: 7.5 mg/L (ref 5.7–26.3)

## 2023-06-20 LAB — MULTIPLE MYELOMA PANEL, SERUM
Albumin SerPl Elph-Mcnc: 3 g/dL (ref 2.9–4.4)
Albumin/Glob SerPl: 1.3 (ref 0.7–1.7)
Alpha 1: 0.2 g/dL (ref 0.0–0.4)
Alpha2 Glob SerPl Elph-Mcnc: 0.8 g/dL (ref 0.4–1.0)
B-Globulin SerPl Elph-Mcnc: 0.9 g/dL (ref 0.7–1.3)
Gamma Glob SerPl Elph-Mcnc: 0.5 g/dL (ref 0.4–1.8)
Globulin, Total: 2.4 g/dL (ref 2.2–3.9)
IgA: 85 mg/dL — ABNORMAL LOW (ref 90–386)
IgG (Immunoglobin G), Serum: 562 mg/dL — ABNORMAL LOW (ref 603–1613)
IgM (Immunoglobulin M), Srm: 41 mg/dL (ref 20–172)
Total Protein ELP: 5.4 g/dL — ABNORMAL LOW (ref 6.0–8.5)

## 2023-06-23 ENCOUNTER — Encounter: Payer: Self-pay | Admitting: *Deleted

## 2023-06-23 NOTE — Telephone Encounter (Signed)
 ATC x1 to schedule f/u with Dr. Wynona Neat.  Left detailed message (DPR) to return call.  Mychart message sent.

## 2023-06-23 NOTE — Telephone Encounter (Signed)
 Scheduled in 30 minute slot on 4/22 at 1 pm.  Sent patient a mychart message to let him know to call back if this time/date does not work for him.

## 2023-06-24 ENCOUNTER — Other Ambulatory Visit: Payer: Self-pay

## 2023-06-24 ENCOUNTER — Inpatient Hospital Stay: Payer: Medicare HMO | Attending: Internal Medicine

## 2023-06-24 ENCOUNTER — Inpatient Hospital Stay: Payer: Medicare HMO

## 2023-06-24 VITALS — BP 122/89 | HR 79 | Temp 96.7°F | Resp 18

## 2023-06-24 DIAGNOSIS — C9002 Multiple myeloma in relapse: Secondary | ICD-10-CM | POA: Diagnosis present

## 2023-06-24 DIAGNOSIS — Z5112 Encounter for antineoplastic immunotherapy: Secondary | ICD-10-CM | POA: Insufficient documentation

## 2023-06-24 DIAGNOSIS — Z79899 Other long term (current) drug therapy: Secondary | ICD-10-CM | POA: Diagnosis not present

## 2023-06-24 LAB — CBC WITH DIFFERENTIAL (CANCER CENTER ONLY)
Abs Immature Granulocytes: 0.11 10*3/uL — ABNORMAL HIGH (ref 0.00–0.07)
Basophils Absolute: 0 10*3/uL (ref 0.0–0.1)
Basophils Relative: 1 %
Eosinophils Absolute: 0.5 10*3/uL (ref 0.0–0.5)
Eosinophils Relative: 7 %
HCT: 40.5 % (ref 39.0–52.0)
Hemoglobin: 14.1 g/dL (ref 13.0–17.0)
Immature Granulocytes: 2 %
Lymphocytes Relative: 16 %
Lymphs Abs: 1.1 10*3/uL (ref 0.7–4.0)
MCH: 31.8 pg (ref 26.0–34.0)
MCHC: 34.8 g/dL (ref 30.0–36.0)
MCV: 91.2 fL (ref 80.0–100.0)
Monocytes Absolute: 0.6 10*3/uL (ref 0.1–1.0)
Monocytes Relative: 9 %
Neutro Abs: 4.5 10*3/uL (ref 1.7–7.7)
Neutrophils Relative %: 65 %
Platelet Count: 280 10*3/uL (ref 150–400)
RBC: 4.44 MIL/uL (ref 4.22–5.81)
RDW: 13.7 % (ref 11.5–15.5)
WBC Count: 6.9 10*3/uL (ref 4.0–10.5)
nRBC: 0 % (ref 0.0–0.2)

## 2023-06-24 LAB — CMP (CANCER CENTER ONLY)
ALT: 15 U/L (ref 0–44)
AST: 16 U/L (ref 15–41)
Albumin: 3.2 g/dL — ABNORMAL LOW (ref 3.5–5.0)
Alkaline Phosphatase: 89 U/L (ref 38–126)
Anion gap: 9 (ref 5–15)
BUN: 13 mg/dL (ref 6–20)
CO2: 24 mmol/L (ref 22–32)
Calcium: 8.7 mg/dL — ABNORMAL LOW (ref 8.9–10.3)
Chloride: 103 mmol/L (ref 98–111)
Creatinine: 1.36 mg/dL — ABNORMAL HIGH (ref 0.61–1.24)
GFR, Estimated: >60 mL/min (ref >60)
Glucose, Bld: 134 mg/dL — ABNORMAL HIGH (ref 70–99)
Potassium: 3.5 mmol/L (ref 3.5–5.1)
Sodium: 136 mmol/L (ref 135–145)
Total Bilirubin: 0.8 mg/dL (ref 0.0–1.2)
Total Protein: 6.2 g/dL — ABNORMAL LOW (ref 6.5–8.1)

## 2023-06-24 MED ORDER — SODIUM CHLORIDE 0.9 % IV SOLN
Freq: Once | INTRAVENOUS | Status: AC
  Filled 2023-06-24: qty 250

## 2023-06-24 MED ORDER — DEXTROSE 5 % IV SOLN
20.0000 mg/m2 | Freq: Once | INTRAVENOUS | Status: AC
  Administered 2023-06-24: 36 mg via INTRAVENOUS
  Filled 2023-06-24: qty 13

## 2023-06-24 MED ORDER — DEXAMETHASONE 4 MG PO TABS
20.0000 mg | ORAL_TABLET | Freq: Once | ORAL | Status: AC
  Administered 2023-06-24: 20 mg via ORAL
  Filled 2023-06-24: qty 5

## 2023-06-24 MED ORDER — HEPARIN SOD (PORK) LOCK FLUSH 100 UNIT/ML IV SOLN
500.0000 [IU] | Freq: Once | INTRAVENOUS | Status: AC | PRN
  Administered 2023-06-24: 500 [IU]
  Filled 2023-06-24: qty 5

## 2023-06-24 NOTE — Patient Instructions (Signed)
 [facility]  Discharge Instructions: Thank you for choosing [facility] to provide your oncology and hematology care.  If you have a lab appointment with the Cancer Center, please go directly to the Cancer Center and check in at the registration area.  Wear comfortable clothing and clothing appropriate for easy access to any Portacath or PICC line.   We strive to give you quality time with your provider. You may need to reschedule your appointment if you arrive late (15 or more minutes).  Arriving late affects you and other patients whose appointments are after yours.  Also, if you miss three or more appointments without notifying the office, you may be dismissed from the clinic at the provider's discretion.      For prescription refill requests, have your pharmacy contact our office and allow 72 hours for refills to be completed.    Today you received the following chemotherapy and/or immunotherapy agents kyprolis      To help prevent nausea and vomiting after your treatment, we encourage you to take your nausea medication as directed.  BELOW ARE SYMPTOMS THAT SHOULD BE REPORTED IMMEDIATELY: *FEVER GREATER THAN 100.4 F (38 C) OR HIGHER *CHILLS OR SWEATING *NAUSEA AND VOMITING THAT IS NOT CONTROLLED WITH YOUR NAUSEA MEDICATION *UNUSUAL SHORTNESS OF BREATH *UNUSUAL BRUISING OR BLEEDING *URINARY PROBLEMS (pain or burning when urinating, or frequent urination) *BOWEL PROBLEMS (unusual diarrhea, constipation, pain near the anus) TENDERNESS IN MOUTH AND THROAT WITH OR WITHOUT PRESENCE OF ULCERS (sore throat, sores in mouth, or a toothache) UNUSUAL RASH, SWELLING OR PAIN  UNUSUAL VAGINAL DISCHARGE OR ITCHING   Items with * indicate a potential emergency and should be followed up as soon as possible or go to the Emergency Department if any problems should occur.  Please show the CHEMOTHERAPY ALERT CARD or IMMUNOTHERAPY  ALERT CARD at check-in to the Emergency Department and triage nurse.  Should you have questions after your visit or need to cancel or reschedule your appointment, please contact [facility] - A DEPT OF [facility]  [phone] and follow the prompts.  Office hours are 8:00 a.m. to 4:30 p.m. Monday - Friday. Please note that voicemails left after 4:00 p.m. may not be returned until the following business day.  We are closed weekends and major holidays. You have access to a nurse at all times for urgent questions. Please call the main number to the clinic [phone] and follow the prompts.  For any non-urgent questions, you may also contact your provider using MyChart. We now offer e-Visits for anyone 63 and older to request care online for non-urgent symptoms. For details visit [email or web address].   Also download the MyChart app! Go to the app store, search "MyChart", open the app, select [facility], and log in with your MyChart username and password.

## 2023-06-26 ENCOUNTER — Telehealth: Payer: Self-pay | Admitting: *Deleted

## 2023-06-26 ENCOUNTER — Other Ambulatory Visit: Payer: Self-pay | Admitting: Oncology

## 2023-06-26 DIAGNOSIS — C9002 Multiple myeloma in relapse: Secondary | ICD-10-CM

## 2023-06-26 NOTE — Telephone Encounter (Signed)
 Can you please check with the patient if he has pomalyst

## 2023-06-26 NOTE — Telephone Encounter (Signed)
 Got message to send in the med. Called rems and there was 30 min wait and I did not have  to wait.

## 2023-06-27 ENCOUNTER — Encounter: Payer: Self-pay | Admitting: Oncology

## 2023-06-27 ENCOUNTER — Other Ambulatory Visit: Payer: Self-pay

## 2023-06-27 DIAGNOSIS — C9002 Multiple myeloma in relapse: Secondary | ICD-10-CM

## 2023-06-27 MED ORDER — POMALIDOMIDE 2 MG PO CAPS: 2.0000 mg | ORAL_CAPSULE | Freq: Every day | ORAL | 0 refills | Status: DC

## 2023-07-01 ENCOUNTER — Encounter: Payer: Self-pay | Admitting: Nurse Practitioner

## 2023-07-01 ENCOUNTER — Other Ambulatory Visit: Payer: Self-pay

## 2023-07-01 ENCOUNTER — Telehealth: Payer: Self-pay | Admitting: Nurse Practitioner

## 2023-07-01 ENCOUNTER — Encounter: Payer: Self-pay | Admitting: Oncology

## 2023-07-01 ENCOUNTER — Other Ambulatory Visit: Payer: Self-pay | Admitting: Primary Care

## 2023-07-01 ENCOUNTER — Inpatient Hospital Stay: Payer: Medicare HMO

## 2023-07-01 ENCOUNTER — Inpatient Hospital Stay (HOSPITAL_BASED_OUTPATIENT_CLINIC_OR_DEPARTMENT_OTHER): Payer: Medicare HMO | Admitting: Nurse Practitioner

## 2023-07-01 VITALS — BP 146/101 | HR 105 | Temp 97.9°F | Resp 20 | Ht 67.0 in | Wt 178.2 lb

## 2023-07-01 VITALS — BP 138/96 | HR 78 | Resp 16

## 2023-07-01 DIAGNOSIS — E876 Hypokalemia: Secondary | ICD-10-CM

## 2023-07-01 DIAGNOSIS — R519 Headache, unspecified: Secondary | ICD-10-CM

## 2023-07-01 DIAGNOSIS — I158 Other secondary hypertension: Secondary | ICD-10-CM | POA: Diagnosis not present

## 2023-07-01 DIAGNOSIS — T50905A Adverse effect of unspecified drugs, medicaments and biological substances, initial encounter: Secondary | ICD-10-CM | POA: Diagnosis not present

## 2023-07-01 DIAGNOSIS — C9002 Multiple myeloma in relapse: Secondary | ICD-10-CM

## 2023-07-01 DIAGNOSIS — Z5111 Encounter for antineoplastic chemotherapy: Secondary | ICD-10-CM | POA: Diagnosis not present

## 2023-07-01 DIAGNOSIS — Z5112 Encounter for antineoplastic immunotherapy: Secondary | ICD-10-CM | POA: Diagnosis not present

## 2023-07-01 LAB — CBC WITH DIFFERENTIAL (CANCER CENTER ONLY)
Abs Immature Granulocytes: 0.18 10*3/uL — ABNORMAL HIGH (ref 0.00–0.07)
Basophils Absolute: 0.1 10*3/uL (ref 0.0–0.1)
Basophils Relative: 1 %
Eosinophils Absolute: 0.4 10*3/uL (ref 0.0–0.5)
Eosinophils Relative: 5 %
HCT: 40.6 % (ref 39.0–52.0)
Hemoglobin: 14.2 g/dL (ref 13.0–17.0)
Immature Granulocytes: 2 %
Lymphocytes Relative: 18 %
Lymphs Abs: 1.5 10*3/uL (ref 0.7–4.0)
MCH: 32 pg (ref 26.0–34.0)
MCHC: 35 g/dL (ref 30.0–36.0)
MCV: 91.4 fL (ref 80.0–100.0)
Monocytes Absolute: 0.6 10*3/uL (ref 0.1–1.0)
Monocytes Relative: 7 %
Neutro Abs: 5.4 10*3/uL (ref 1.7–7.7)
Neutrophils Relative %: 67 %
Platelet Count: 244 10*3/uL (ref 150–400)
RBC: 4.44 MIL/uL (ref 4.22–5.81)
RDW: 13.8 % (ref 11.5–15.5)
WBC Count: 8.1 10*3/uL (ref 4.0–10.5)
nRBC: 0 % (ref 0.0–0.2)

## 2023-07-01 LAB — CMP (CANCER CENTER ONLY)
ALT: 25 U/L (ref 0–44)
AST: 27 U/L (ref 15–41)
Albumin: 3.3 g/dL — ABNORMAL LOW (ref 3.5–5.0)
Alkaline Phosphatase: 94 U/L (ref 38–126)
Anion gap: 7 (ref 5–15)
BUN: 12 mg/dL (ref 6–20)
CO2: 25 mmol/L (ref 22–32)
Calcium: 8.6 mg/dL — ABNORMAL LOW (ref 8.9–10.3)
Chloride: 105 mmol/L (ref 98–111)
Creatinine: 1.43 mg/dL — ABNORMAL HIGH (ref 0.61–1.24)
GFR, Estimated: 59 mL/min — ABNORMAL LOW (ref >60)
Glucose, Bld: 109 mg/dL — ABNORMAL HIGH (ref 70–99)
Potassium: 3.6 mmol/L (ref 3.5–5.1)
Sodium: 137 mmol/L (ref 135–145)
Total Bilirubin: 0.8 mg/dL (ref 0.0–1.2)
Total Protein: 6.3 g/dL — ABNORMAL LOW (ref 6.5–8.1)

## 2023-07-01 MED ORDER — DEXTROSE 5 % IV SOLN
20.0000 mg/m2 | Freq: Once | INTRAVENOUS | Status: AC
  Administered 2023-07-01: 36 mg via INTRAVENOUS
  Filled 2023-07-01: qty 15

## 2023-07-01 MED ORDER — POTASSIUM CHLORIDE CRYS ER 20 MEQ PO TBCR
40.0000 meq | EXTENDED_RELEASE_TABLET | Freq: Every day | ORAL | 2 refills | Status: DC
Start: 2023-07-01 — End: 2023-08-29
  Filled 2023-07-01: qty 60, 30d supply, fill #0

## 2023-07-01 MED ORDER — SODIUM CHLORIDE 0.9 % IV SOLN
Freq: Once | INTRAVENOUS | Status: AC
Start: 2023-07-01 — End: 2023-07-01
  Filled 2023-07-01: qty 250

## 2023-07-01 MED ORDER — HEPARIN SOD (PORK) LOCK FLUSH 100 UNIT/ML IV SOLN
500.0000 [IU] | Freq: Once | INTRAVENOUS | Status: AC | PRN
Start: 2023-07-01 — End: 2023-07-01
  Administered 2023-07-01: 500 [IU]
  Filled 2023-07-01: qty 5

## 2023-07-01 MED ORDER — DEXAMETHASONE 4 MG PO TABS
20.0000 mg | ORAL_TABLET | Freq: Once | ORAL | Status: AC
  Administered 2023-07-01: 20 mg via ORAL
  Filled 2023-07-01: qty 5

## 2023-07-01 MED ORDER — ACYCLOVIR 400 MG PO TABS
400.0000 mg | ORAL_TABLET | Freq: Two times a day (BID) | ORAL | 2 refills | Status: AC
  Filled 2023-07-01: qty 60, 30d supply, fill #0

## 2023-07-01 MED ORDER — ASPIRIN 81 MG PO TBEC
81.0000 mg | DELAYED_RELEASE_TABLET | Freq: Every day | ORAL | 2 refills | Status: DC
  Filled 2023-07-01: qty 30, 30d supply, fill #0

## 2023-07-01 MED ORDER — OXYCODONE HCL 15 MG PO TABS
15.0000 mg | ORAL_TABLET | Freq: Three times a day (TID) | ORAL | 0 refills | Status: DC | PRN
Start: 2023-07-01 — End: 2023-07-31
  Filled 2023-07-01: qty 90, 30d supply, fill #0

## 2023-07-01 NOTE — Progress Notes (Signed)
 Hematology/Oncology Consult note [facility]  [phone] [phone]  Patient Care Team: Doreene Nest, NP as PCP - General (Internal Medicine) Martin Majestic, MD as PCP - Hematology/Oncology Debbe Odea, MD as PCP - Cardiology (Cardiology) Creig Hines, MD as Consulting Physician (Hematology and Oncology)   Name of the patient: James House  [id number]  [birth year]   Date of visit: 07/01/23  Diagnosis- recurrent kappa light chain multiple myeloma in relapse   Chief complaint/ Reason for visit-on treatment assessment prior to cycle 12-day 15 of Kyprolis  Heme/Onc history: Patient is a 53- year-old male with a history of kappa light chain myeloma that was treated by [name].  History is as follows:   1.  Patient presented with renal insufficiency with a creatinine of 2.4 in April 2018.  Kidney biopsy showed myeloma cast nephropathy.  Kappa light chain was 3004 and lambda 0.22 with a kappa lambda ratio of 13,000 655.  Skeletal survey showed multiple calvarial and marrow lesions.  Bone marrow biopsy in May 2018 showed 43% plasma cells.  He received CyBorD for cycle 1 in May 2018 and was subsequently switched to RVD.  He received 4 cycles followed by a repeat bone marrow biopsy in August 2018 which showed no increase in plasma cells.   2.  He underwent autologous stem cell transplantation on 01/30/2017.  He was started on Revlimid maintenance 10 mg daily in January 2019.   3.  Labs from January February and March 2019 done at [facility] showed no M spike, normal kappa lambda ratio of 1.06.  He was last seen by them in April 2019.   4. Following that patient was admitted to [facility] for healthcare associated pneumonia and was recently discharged.  He wished to transfer his care to [place] at this time.  He was on maintenance Revlimid 5 mg which was then reduced to 2.5 mg.  Follows up with pain clinic for chemo-induced peripheral neuropathy with Dr.  Laban Emperor.  His urine tested positive for [facility] and therefore patient was discharged from pain clinic   5.  Noted to have gradually increasing free kappa light chainFrom 15 in 20 21-39.5 with a ratio of 3.09.  He was seen by [facility] again and underwent a repeat bone marrow biopsy which did not show any evidence of clonal plasma cells.  PET CT scan also did not show any new active lytic lesions in May 2023.Marland Kitchen  Revlimid dose was increased to 15 mg 3 weeks on 1 week off but kappa light chains show continuing to increase   6.  PET CT scan in December 2023 did not show any evidence of active myeloma.  Patient underwent repeat bone marrow biopsy at [facility] which showed 6% plasma cellsConsistent with persistent plasma cell neoplasm.  74 to 86% of isolated plasma cells show loss of T p53 and monosomy 13.  Patient was seen by [name] at [facility] and was recommended Darzalex Pomalyst dexamethasone regimen   7.  Disease progression after 8 weekly cycles of Darazalex kappa light chain increased from 332-646 with a ratio that went up from 39-57 concerning for disease progression.  Patient switched to carfilzomib Pomalyst dexamethasone regimen    Interval history-patient is a 53 year old male who returns for consideration of continuation of treatment.  He reports compliance with his Pomalyst chases.  He has not yet been able to reschedule his appointment at [facility] for consideration of CAR-T.  He continues to tolerate treatment well.  Pain is stable.  Request refill of oxycodone.  Denies new complaints.  Reports compliance with blood pressure medicine. Had large caffeinated coffee this morning on his way to his appointment.   ECOG PS- 2 Pain scale- 0 Opioid associated constipation- no  Review of systems- Review of Systems  Constitutional:  Positive for malaise/fatigue. Negative for chills, fever and weight loss.  HENT:  Negative for congestion, ear discharge and nosebleeds.   Eyes:  Negative for blurred vision.  Respiratory:   Negative for cough, hemoptysis, sputum production, shortness of breath and wheezing.   Cardiovascular:  Negative for chest pain, palpitations, orthopnea and claudication.  Gastrointestinal:  Negative for abdominal pain, blood in stool, constipation, diarrhea, heartburn, melena, nausea and vomiting.  Genitourinary:  Negative for dysuria, flank pain, frequency, hematuria and urgency.  Musculoskeletal:  Positive for back pain and myalgias. Negative for joint pain.  Skin:  Negative for rash.  Neurological:  Negative for dizziness, tingling, focal weakness, seizures, weakness and headaches.  Endo/Heme/Allergies:  Does not bruise/bleed easily.  Psychiatric/Behavioral:  Negative for depression and suicidal ideas. The patient does not have insomnia.     Allergies  Allergen Reactions   Morphine Nausea And Vomiting   Past Medical History:  Diagnosis Date   CKD (chronic kidney disease) stage 3, GFR 30-59 ml/min (HCC)    Elevated sedimentation rate 12/24/2017   Hypertension    Multiple myeloma (HCC) 08/11/2017   Neuropathy    Pneumonia    Severe sepsis (HCC) 08/11/2017   Shingles 08/03/2019   Past Surgical History:  Procedure Laterality Date   ABDOMINAL SURGERY     COLONOSCOPY WITH PROPOFOL N/A 05/25/2021   Procedure: COLONOSCOPY WITH PROPOFOL;  Surgeon: Toney Reil, MD;  Location: [facility] ENDOSCOPY;  Service: Gastroenterology;  Laterality: N/A;   IR IMAGING GUIDED PORT INSERTION  08/02/2022   JOINT REPLACEMENT     right knee   KNEE SURGERY Left    Social History   Socioeconomic History   Marital status: Single    Spouse name: [name]   Number of children: 3   Years of education: Not on file   Highest education level: Not on file  Occupational History   Not on file  Tobacco Use   Smoking status: Former   Smokeless tobacco: Current    Types: Snuff  Vaping Use   Vaping status: Never Used  Substance and Sexual Activity   Alcohol use: No   Drug use: No   Sexual activity: Yes   Other Topics Concern   Not on file  Social History Narrative   Live in private residence with spouse and mother in law   Social Drivers of Health   Financial Resource Strain: Low Risk  (08/12/2017)   Overall Financial Resource Strain (CARDIA)    Difficulty of Paying Living Expenses: Not hard at all  Food Insecurity: No Food Insecurity (08/12/2017)   Hunger Vital Sign    Worried About Running Out of Food in the Last Year: Never true    Ran Out of Food in the Last Year: Never true  Transportation Needs: No Transportation Needs (08/12/2017)   PRAPARE - Administrator, Civil Service (Medical): No    Lack of Transportation (Non-Medical): No  Physical Activity: Sufficiently Active (08/12/2017)   Exercise Vital Sign    Days of Exercise per Week: 7 days    Minutes of Exercise per Session: 30 min  Stress: No Stress Concern Present (08/12/2017)   [employer] of Occupational Health - Occupational Stress Questionnaire  Feeling of Stress : Only a little  Social Connections: Somewhat Isolated (08/12/2017)   Social Connection and Isolation Panel [NHANES]    Frequency of Communication with Friends and Family: Once a week    Frequency of Social Gatherings with Friends and Family: Once a week    Attends Religious Services: More than 4 times per year    Active Member of [employer] or Organizations: No    Attends Banker Meetings: Never    Marital Status: Married  Catering manager Violence: Not At Risk (08/12/2017)   Humiliation, Afraid, Rape, and Kick questionnaire    Fear of Current or Ex-Partner: No    Emotionally Abused: No    Physically Abused: No    Sexually Abused: No    Family History  Problem Relation Age of Onset   Heart disease Mother    Cancer Mother 18       Pancreatic   COPD Mother    Diabetes Mother    Hyperlipidemia Mother    Hypertension Mother    Cancer Maternal Uncle        pancreatic   Heart disease Maternal Grandmother    Cancer Maternal  Grandmother 59       colon   COPD Maternal Grandmother    Diabetes Maternal Grandmother    Hypertension Maternal Grandmother      Current Outpatient Medications:    acyclovir (ZOVIRAX) 400 MG tablet, Take 1 tablet (400 mg total) by mouth 2 (two) times daily., Disp: 60 tablet, Rfl: 2   albuterol (VENTOLIN HFA) 108 (90 Base) MCG/ACT inhaler, Inhale 2 puffs into the lungs every 6 (six) hours as needed for wheezing or shortness of breath., Disp: 6.7 g, Rfl: 6   amitriptyline (ELAVIL) 25 MG tablet, Take 1 tablet (25 mg total) by mouth at bedtime. For headache prevention and sleep, Disp: 90 tablet, Rfl: 1   amLODipine-benazepril (LOTREL) 10-40 MG capsule, Take 1 capsule by mouth daily. for blood pressure., Disp: 90 capsule, Rfl: 1   aspirin EC 81 MG tablet, Take by mouth., Disp: , Rfl:    baclofen (LIORESAL) 10 MG tablet, Take 1 tablet (10 mg total) by mouth 4 (four) times daily as needed for muscle spasms., Disp: 360 tablet, Rfl: 1   carvedilol (COREG) 12.5 MG tablet, Take 1 tablet (12.5 mg total) by mouth 2 (two) times daily., Disp: 180 tablet, Rfl: 1   celecoxib (CELEBREX) 200 MG capsule, Take 1 capsule (200 mg total) by mouth daily., Disp: 30 capsule, Rfl: 2   DULoxetine (CYMBALTA) 60 MG capsule, Take 1 capsule (60 mg total) by mouth daily., Disp: 60 capsule, Rfl: 1   lidocaine-prilocaine (EMLA) cream, Apply 1 Application topically as needed., Disp: 30 g, Rfl: 0   methocarbamol (ROBAXIN-750) 750 MG tablet, Take 1 tablet (750 mg total) by mouth 3 (three) times daily., Disp: 90 tablet, Rfl: 0   montelukast (SINGULAIR) 10 MG tablet, Take 1 tablet in the evening 1 day before your treatment. Day of treatment take 1 tablet in the morning for the first 2 treatments of Daratumumab, Disp: 4 tablet, Rfl: 0   OLANZapine (ZYPREXA) 10 MG tablet, Take 1 tablet (10 mg total) by mouth at bedtime. To help with nausea prevention., Disp: 30 tablet, Rfl: 1   oxyCODONE ER (XTAMPZA ER) 9 MG C12A, Take 9 mg by mouth  every 12 (twelve) hours., Disp: 60 capsule, Rfl: 0   pomalidomide (POMALYST) 2 MG capsule, Take 1 capsule (2 mg total) by mouth daily. Take for 21  days, then hold for 7 days. Repeat every 28 days., Disp: 21 capsule, Rfl: 0   potassium chloride SA (KLOR-CON M) 20 MEQ tablet, Take 2 tablets (40 mEq total) by mouth 2 (two) times daily. (Patient taking differently: Take 40 mEq by mouth daily.), Disp: 120 tablet, Rfl: 0   pregabalin (LYRICA) 300 MG capsule, TAKE ONE CAPSULE (300MG  TOTAL) BY MOUTH TWICE DAILY, Disp: 180 capsule, Rfl: 0   prochlorperazine (COMPAZINE) 10 MG tablet, Take by mouth every 8 (eight) hours as needed., Disp: , Rfl:    cyanocobalamin (VITAMIN B12) 1000 MCG/ML injection, Inject 1 mL (1,000 mcg total) into the muscle every 30 (thirty) days. (Patient not taking: Reported on 07/01/2023), Disp: 1 mL, Rfl: 11   oxyCODONE (ROXICODONE) 15 MG immediate release tablet, Take 1 tablet (15 mg total) by mouth every 8 (eight) hours as needed for pain., Disp: 90 tablet, Rfl: 0  Physical exam:  Vitals:   07/01/23 0902 07/01/23 0910  BP: (!) 153/102 (!) 146/101  Pulse: (!) 110 (!) 105  Resp: 20   Temp: 97.9 F (36.6 C)   TempSrc: Tympanic   Weight: 178 lb 3.2 oz (80.8 kg)   Height: 5\' 7"  (1.702 m)    Physical Exam Constitutional:      Appearance: He is not ill-appearing.  Cardiovascular:     Rate and Rhythm: Normal rate and regular rhythm.     Heart sounds: Normal heart sounds.  Pulmonary:     Effort: Pulmonary effort is normal.     Breath sounds: Normal breath sounds.  Musculoskeletal:     Right lower leg: No edema.     Left lower leg: No edema.     Comments: cane  Skin:    General: Skin is warm and dry.  Neurological:     Mental Status: He is alert and oriented to person, place, and time.  Psychiatric:        Mood and Affect: Mood normal.        Behavior: Behavior normal.        Latest Ref Rng & Units 07/01/2023    8:46 AM  CMP  Glucose 70 - 99 mg/dL 161   BUN 6 - 20  mg/dL 12   Creatinine 0.96 - 1.24 mg/dL 0.45   Sodium 409 - 811 mmol/L 137   Potassium 3.5 - 5.1 mmol/L 3.6   Chloride 98 - 111 mmol/L 105   CO2 22 - 32 mmol/L 25   Calcium 8.9 - 10.3 mg/dL 8.6   Total Protein 6.5 - 8.1 g/dL 6.3   Total Bilirubin 0.0 - 1.2 mg/dL 0.8   Alkaline Phos 38 - 126 U/L 94   AST 15 - 41 U/L 27   ALT 0 - 44 U/L 25       Latest Ref Rng & Units 07/01/2023    8:46 AM  CBC  WBC 4.0 - 10.5 K/uL 8.1   Hemoglobin 13.0 - 17.0 g/dL 91.4   Hematocrit 78.2 - 52.0 % 40.6   Platelets 150 - 400 K/uL 244     Assessment and plan- Patient is a 53 y.o. male with history of kappa light chain multiple myeloma in relapse.  He is here for on treatment assessment prior to cycle 12-day 15 of carfilzomib and Pomalyst  Labs reviewed today and acceptable for treatment.  Proceed with cycle 12-day 15 of carfilzomib today.  Next week is his off week.  He reports compliance with his Pomalyst and I have encouraged him to notify  her so that we might assist if he is out of medication.  Previously, his multiple myeloma numbers were going up but I suspect that this was because he had a gap of 2 months without taking medication to reflect a worsening of his disease.  2 weeks ago his kappa lambda light chain ratio had decreased from 12 to 8.2.  For now, we will put his follow-up with [facility] on hold so that we can better assess his response to treatment.  He will continue aspirin and acyclovir prophylaxis.  Refilled.  Hypertension- carfilzomib can cause a worsening of his blood pressure.  Blood pressure is elevated today but he is clinically asymptomatic.  I reached out to his primary care doctor for assistance and recommendations for adjustment of his medications.  If he does not hear from them I have asked him to notify me so that I might adjust his medications.   Chronic back pain and peripheral neuropathy-previously on Xtampza which she has self discontinued.  Pain controlled with short acting  oxycodone.  Refill provided today.  PDMP reviewed.  He receives pregabalin from PCP.  Hypokalemia-potassium 3.6.  Encouraged compliance with medication.  Refilled K-Dur 40 mEq daily.  He will return to clinic for labs and proceed directly to treatment for cycle 13-day 1.  He will see Dr. Smith Robert for cycle 13, day 8.   Visit Diagnosis 1. Multiple myeloma in relapse (HCC)   2. Encounter for antineoplastic chemotherapy   3. Hypokalemia   4. Hypertension due to drug    Consuello Masse, DNP, AGNP-C, [facility] [phone] (clinic) 07/01/2023

## 2023-07-01 NOTE — Telephone Encounter (Signed)
 I was going to increase his carvedilol but he got in with you for tomorrow

## 2023-07-01 NOTE — Patient Instructions (Signed)
 Your blood pressure is too high. This can be a side effect of the carfilzomib. I've reached out to your PCP to contact you to adjust your medications. Please let me know if you don't hear from them so that I can make adjustments. If you experience chest pain, worsening shortness of breath, headache, or worrisome symptoms, I'd like for you to go to the hospital.

## 2023-07-01 NOTE — Telephone Encounter (Signed)
 Patient called back in, he stated that he has been taking both BP meds as prescribed. Patient checked his BP while on the phone with me with an automatic wrist cuff BP: 140/101 Pulse:100. Scheduled patient appt with Mort Sawyers 07/02/23.

## 2023-07-01 NOTE — Telephone Encounter (Signed)
 Thanks for the heads up will see him tomorrow as scheduled

## 2023-07-01 NOTE — Telephone Encounter (Signed)
 Got a message from Heme/onc about worsening BP. Can we reach out to the patient and make sure that he has been taking the amlodipine-benazepril 10-40mg , and the carvedilol 12.5mg  BID. If he has does he have the ability to check his blood pressure and pulse at home  Also can we get him in office this week for a BP recheck

## 2023-07-01 NOTE — Telephone Encounter (Signed)
 Copied from CRM [id number]. Topic: Clinical - Medication Refill >> Jul 01, 2023 10:45 AM Denese Killings wrote: Most Recent Primary Care Visit:  Provider: Doreene Nest  Department: [facility]  Visit Type: OFFICE VISIT  Date: 12/25/2022  Medication: amitriptyline (ELAVIL) 25 MG tablet  Has the patient contacted their pharmacy? No (Agent: If no, request that the patient contact the pharmacy for the refill. If patient does not wish to contact the pharmacy document the reason why and proceed with request.) (Agent: If yes, when and what did the pharmacy advise?) pharmacy called in to refill  Is this the correct pharmacy for this prescription? Yes If no, delete pharmacy and type the correct one.  This is the patient's preferred pharmacy:   [facility] [place] [address] [place] [address] Phone: [phone] Fax: [phone]   Has the prescription been filled recently? Yes  Is the patient out of the medication? No  Has the patient been seen for an appointment in the last year OR does the patient have an upcoming appointment? Yes  Can we respond through MyChart? No  Agent: Please be advised that Rx refills may take up to 3 business days. We ask that you follow-up with your pharmacy.

## 2023-07-01 NOTE — Patient Instructions (Signed)
 [facility]  Discharge Instructions: Thank you for choosing [facility] to provide your oncology and hematology care.  If you have a lab appointment with the Cancer Center, please go directly to the Cancer Center and check in at the registration area.  Wear comfortable clothing and clothing appropriate for easy access to any Portacath or PICC line.   We strive to give you quality time with your provider. You may need to reschedule your appointment if you arrive late (15 or more minutes).  Arriving late affects you and other patients whose appointments are after yours.  Also, if you miss three or more appointments without notifying the office, you may be dismissed from the clinic at the provider's discretion.      For prescription refill requests, have your pharmacy contact our office and allow 72 hours for refills to be completed.    Today you received the following chemotherapy and/or immunotherapy agents kyprolis      To help prevent nausea and vomiting after your treatment, we encourage you to take your nausea medication as directed.  BELOW ARE SYMPTOMS THAT SHOULD BE REPORTED IMMEDIATELY: *FEVER GREATER THAN 100.4 F (38 C) OR HIGHER *CHILLS OR SWEATING *NAUSEA AND VOMITING THAT IS NOT CONTROLLED WITH YOUR NAUSEA MEDICATION *UNUSUAL SHORTNESS OF BREATH *UNUSUAL BRUISING OR BLEEDING *URINARY PROBLEMS (pain or burning when urinating, or frequent urination) *BOWEL PROBLEMS (unusual diarrhea, constipation, pain near the anus) TENDERNESS IN MOUTH AND THROAT WITH OR WITHOUT PRESENCE OF ULCERS (sore throat, sores in mouth, or a toothache) UNUSUAL RASH, SWELLING OR PAIN  UNUSUAL VAGINAL DISCHARGE OR ITCHING   Items with * indicate a potential emergency and should be followed up as soon as possible or go to the Emergency Department if any problems should occur.  Please show the CHEMOTHERAPY ALERT CARD or IMMUNOTHERAPY  ALERT CARD at check-in to the Emergency Department and triage nurse.  Should you have questions after your visit or need to cancel or reschedule your appointment, please contact [facility] - A DEPT OF [facility]  [phone] and follow the prompts.  Office hours are 8:00 a.m. to 4:30 p.m. Monday - Friday. Please note that voicemails left after 4:00 p.m. may not be returned until the following business day.  We are closed weekends and major holidays. You have access to a nurse at all times for urgent questions. Please call the main number to the clinic [phone] and follow the prompts.  For any non-urgent questions, you may also contact your provider using MyChart. We now offer e-Visits for anyone 63 and older to request care online for non-urgent symptoms. For details visit [email or web address].   Also download the MyChart app! Go to the app store, search "MyChart", open the app, select [facility], and log in with your MyChart username and password.

## 2023-07-01 NOTE — Telephone Encounter (Signed)
 Unable to reach patient. Left voicemail to return call to our office.

## 2023-07-02 ENCOUNTER — Other Ambulatory Visit: Payer: Self-pay

## 2023-07-02 ENCOUNTER — Encounter: Payer: Self-pay | Admitting: Family

## 2023-07-02 ENCOUNTER — Telehealth: Payer: Self-pay

## 2023-07-02 ENCOUNTER — Ambulatory Visit (INDEPENDENT_AMBULATORY_CARE_PROVIDER_SITE_OTHER): Admitting: Family

## 2023-07-02 ENCOUNTER — Inpatient Hospital Stay: Admitting: Licensed Clinical Social Worker

## 2023-07-02 ENCOUNTER — Ambulatory Visit (INDEPENDENT_AMBULATORY_CARE_PROVIDER_SITE_OTHER)
Admission: RE | Admit: 2023-07-02 | Discharge: 2023-07-02 | Disposition: A | Discharge status: Home / Self Care | Source: Ambulatory Visit | Attending: Family

## 2023-07-02 VITALS — BP 142/108 | HR 110 | Temp 98.2°F | Ht 67.0 in | Wt 182.6 lb

## 2023-07-02 DIAGNOSIS — I10 Essential (primary) hypertension: Secondary | ICD-10-CM

## 2023-07-02 DIAGNOSIS — E0789 Other specified disorders of thyroid: Secondary | ICD-10-CM | POA: Insufficient documentation

## 2023-07-02 DIAGNOSIS — R635 Abnormal weight gain: Secondary | ICD-10-CM | POA: Diagnosis not present

## 2023-07-02 DIAGNOSIS — R0609 Other forms of dyspnea: Secondary | ICD-10-CM | POA: Diagnosis not present

## 2023-07-02 DIAGNOSIS — R1312 Dysphagia, oropharyngeal phase: Secondary | ICD-10-CM | POA: Insufficient documentation

## 2023-07-02 DIAGNOSIS — R1013 Epigastric pain: Secondary | ICD-10-CM | POA: Insufficient documentation

## 2023-07-02 DIAGNOSIS — J90 Pleural effusion, not elsewhere classified: Secondary | ICD-10-CM | POA: Diagnosis not present

## 2023-07-02 DIAGNOSIS — J9811 Atelectasis: Secondary | ICD-10-CM | POA: Diagnosis not present

## 2023-07-02 DIAGNOSIS — R12 Heartburn: Secondary | ICD-10-CM | POA: Insufficient documentation

## 2023-07-02 LAB — TSH: TSH: 1.36 u[IU]/mL (ref 0.35–5.50)

## 2023-07-02 LAB — T4, FREE: Free T4: 0.92 ng/dL (ref 0.60–1.60)

## 2023-07-02 LAB — T3, FREE: T3, Free: 3 pg/mL (ref 2.3–4.2)

## 2023-07-02 LAB — BRAIN NATRIURETIC PEPTIDE: Pro B Natriuretic peptide (BNP): 51 pg/mL (ref 0.0–100.0)

## 2023-07-02 MED ORDER — CARVEDILOL 25 MG PO TABS
25.0000 mg | ORAL_TABLET | Freq: Two times a day (BID) | ORAL | 3 refills | Status: DC
  Filled 2023-07-02: qty 60, 30d supply, fill #0

## 2023-07-02 MED ORDER — PANTOPRAZOLE SODIUM 40 MG PO TBEC
40.0000 mg | DELAYED_RELEASE_TABLET | Freq: Every day | ORAL | 0 refills | Status: AC
  Filled 2023-07-02: qty 30, 30d supply, fill #0

## 2023-07-02 MED ORDER — AMITRIPTYLINE HCL 25 MG PO TABS
25.0000 mg | ORAL_TABLET | Freq: Every day | ORAL | 1 refills | Status: AC
Start: 2023-07-02 — End: ?
  Filled 2023-07-02: qty 90, 90d supply, fill #0

## 2023-07-02 NOTE — Telephone Encounter (Signed)
 Clinical Social Work was referred by medical provider for assessment of psychosocial needs and to discuss Advance Directives.  CSW attempted to contact patient by phone.  Left voicemail with contact information and request for return call.

## 2023-07-02 NOTE — Assessment & Plan Note (Signed)
 Increase carvedilol 25 mg twice daily  F/u cardiology  Increase fluids  Pt advised of the following:  Continue medication as prescribed. Monitor blood pressure periodically and/or when you feel symptomatic. Goal is <130/90 on average. Ensure that you have rested for 30 minutes prior to checking your blood pressure. Record your readings and bring them to your next visit if necessary.work on a low sodium diet.

## 2023-07-02 NOTE — Progress Notes (Signed)
 [facility] Clinical Social Work  Clinical Social Work was referred by medical provider for assessment of psychosocial needs.  Clinical Social Worker contacted patient by phone to offer support and assess for needs.    Patient stated he would like to complete his advance directives.  Provided patient with information regarding the process.  Patient is scheduled for [facility] at 11:30am on 3/20.  Patient expressed no other needs.     Dorothey Baseman, LCSW  Clinical Social Worker [facility]

## 2023-07-02 NOTE — Assessment & Plan Note (Signed)
 Suspect gerd vs thyroid, treating with pantoprazole and ordered thyroid u/s However will order swallow study pending the results of these tests

## 2023-07-02 NOTE — Assessment & Plan Note (Addendum)
 Cxr ordered Bnp ordered Ddx lung process vs chf  Discussed red flag precautions Echo reviewed

## 2023-07-02 NOTE — Assessment & Plan Note (Signed)
 Thyroid panel ordered U/s thyroid ordered Along with abn weight gain r/o  Could be from acid reflux as well  Trial pantoprazole

## 2023-07-02 NOTE — Patient Instructions (Addendum)
  I have sent in your order electronically for the following: ultrasound thyroid  at this location below. Please call to schedule the appointment at your convenience  [facility] outpatient [facility] center off [address], [place] [address] Phone [phone]-  8-5 pm   ------------------------------------  Trial pantoprazole 40 mg once daily  Try to decrease and or avoid spicy foods, fried fatty foods, and also caffeine and chocolate as these can increase heartburn symptoms.   ------------------------------------  Ordered a swallow study  Please let us know if you have not heard back within 2 weeks about the referral.  ------------------------------------  Increase carvedilol 25 mg twice daily

## 2023-07-02 NOTE — Progress Notes (Signed)
 Established Patient Office Visit  Subjective:   Patient ID: James House., male    DOB: 11/21/1970  Age: 53 y.o. MRN: [id number]  CC:  Chief Complaint  Patient presents with   Hypertension    HPI: James House. is a 53 y.o. male presenting on 07/02/2023 for Hypertension   HTN: yesterday blood pressure was elevated at the oncology office he had an appt with. The blood pressure was 146/101. Today is 150/110. He does have a wrist cuff but plans to get a upper ext cuff. He was advised blood pressure may increase on the current chemotherapy he is on.currently for blood pressure on amlodipine benazepril 10-40 mg daily as well as carvedilol 12.5 mg twice daily.   He does state that at times hard for him to swallow things, and feels he is choking when he tries to swallow. He has to drink something to 'push' it down. This happens with all foods, all sizes, all consistencies. No issues with breathing. The only thing he can eat is pudding. He does not have any neck tenderness. He does feel like there is some fullness in his lower base of neck anteriorly. He does have a lot of heartburn. He has gained a good amount of weight, even 7 pounds in the last one week. He is on prednisone with his oncologist which may be adding to this.   He sees oncology for recurrent light chain multiple myeloma in relapse.   Wt Readings from Last 3 Encounters:  07/02/23 182 lb 9.6 oz (82.8 kg)  07/01/23 178 lb 3.2 oz (80.8 kg)  06/24/23 175 lb 11.3 oz (79.7 kg)   He has noticed even walking short distances with increasing sob and or climbing up 13 steps into apartment, when he gets halfway he is already out of breath.  He doesn't have sob at rest. He does have a cardiologist. He is using albuterol up to three times a day.   10/04/22 echo EF 55-60% , grade 1 diastolic dysfunction  Cmp kidneys stable      ROS: Negative unless specifically indicated above in HPI.   Relevant past medical history reviewed and  updated as indicated.   Allergies and medications reviewed and updated.   Current Outpatient Medications:    acyclovir (ZOVIRAX) 400 MG tablet, Take 1 tablet (400 mg total) by mouth 2 (two) times daily., Disp: 60 tablet, Rfl: 2   albuterol (VENTOLIN HFA) 108 (90 Base) MCG/ACT inhaler, Inhale 2 puffs into the lungs every 6 (six) hours as needed for wheezing or shortness of breath., Disp: 6.7 g, Rfl: 6   amLODipine-benazepril (LOTREL) 10-40 MG capsule, Take 1 capsule by mouth daily. for blood pressure., Disp: 90 capsule, Rfl: 1   aspirin EC 81 MG tablet, Take 1 tablet (81 mg total) by mouth daily., Disp: 30 tablet, Rfl: 2   baclofen (LIORESAL) 10 MG tablet, Take 1 tablet (10 mg total) by mouth 4 (four) times daily as needed for muscle spasms., Disp: 360 tablet, Rfl: 1   carvedilol (COREG) 25 MG tablet, Take 1 tablet (25 mg total) by mouth 2 (two) times daily with a meal., Disp: 60 tablet, Rfl: 3   celecoxib (CELEBREX) 200 MG capsule, Take 1 capsule (200 mg total) by mouth daily., Disp: 30 capsule, Rfl: 2   cyanocobalamin (VITAMIN B12) 1000 MCG/ML injection, Inject 1 mL (1,000 mcg total) into the muscle every 30 (thirty) days., Disp: 1 mL, Rfl: 11   lidocaine-prilocaine (EMLA) cream, Apply 1 Application  topically as needed., Disp: 30 g, Rfl: 0   methocarbamol (ROBAXIN-750) 750 MG tablet, Take 1 tablet (750 mg total) by mouth 3 (three) times daily., Disp: 90 tablet, Rfl: 0   OLANZapine (ZYPREXA) 10 MG tablet, Take 1 tablet (10 mg total) by mouth at bedtime. To help with nausea prevention., Disp: 30 tablet, Rfl: 1   oxyCODONE (ROXICODONE) 15 MG immediate release tablet, Take 1 tablet (15 mg total) by mouth every 8 (eight) hours as needed for pain., Disp: 90 tablet, Rfl: 0   pantoprazole (PROTONIX) 40 MG tablet, Take 1 tablet (40 mg total) by mouth daily., Disp: 30 tablet, Rfl: 0   pomalidomide (POMALYST) 2 MG capsule, Take 1 capsule (2 mg total) by mouth daily. Take for 21 days, then hold for 7 days.  Repeat every 28 days., Disp: 21 capsule, Rfl: 0   Potassium Chloride ER 20 MEQ TBCR, Take 1 tablet by mouth 2 (two) times daily., Disp: , Rfl:    potassium chloride SA (KLOR-CON M) 20 MEQ tablet, Take 2 tablets (40 mEq total) by mouth daily., Disp: 60 tablet, Rfl: 2   pregabalin (LYRICA) 300 MG capsule, TAKE ONE CAPSULE (300MG  TOTAL) BY MOUTH TWICE DAILY, Disp: 180 capsule, Rfl: 0   prochlorperazine (COMPAZINE) 10 MG tablet, Take by mouth every 8 (eight) hours as needed., Disp: , Rfl:    amitriptyline (ELAVIL) 25 MG tablet, Take 1 tablet (25 mg total) by mouth at bedtime. For headache prevention and sleep, Disp: 90 tablet, Rfl: 1   DULoxetine (CYMBALTA) 60 MG capsule, Take 1 capsule (60 mg total) by mouth daily., Disp: 60 capsule, Rfl: 1  Allergies  Allergen Reactions   Morphine Nausea And Vomiting    Objective:   BP (!) 150/110 (BP Location: Left Arm, Patient Position: Sitting, Cuff Size: Normal)   Pulse (!) 110   Temp 98.2 F (36.8 C) (Temporal)   Ht 5\' 7"  (1.702 m)   Wt 182 lb 9.6 oz (82.8 kg)   SpO2 97%   BMI 28.60 kg/m    Physical Exam Vitals reviewed.  Constitutional:      General: He is not in acute distress.    Appearance: Normal appearance. He is obese. He is not ill-appearing, toxic-appearing or diaphoretic.  HENT:     Head: Normocephalic.     Right Ear: Tympanic membrane normal.     Left Ear: Tympanic membrane normal.     Nose: Nose normal.     Mouth/Throat:     Mouth: Mucous membranes are moist.  Eyes:     Pupils: Pupils are equal, round, and reactive to light.  Neck:     Thyroid: Thyroid tenderness present.  Cardiovascular:     Rate and Rhythm: Normal rate and regular rhythm.  Pulmonary:     Effort: Pulmonary effort is normal.     Breath sounds: Normal breath sounds. No wheezing.  Abdominal:     Tenderness: There is abdominal tenderness in the epigastric area.  Musculoskeletal:        General: Normal range of motion.     Cervical back: Normal range of  motion.  Neurological:     General: No focal deficit present.     Mental Status: He is alert and oriented to person, place, and time. Mental status is at baseline.  Psychiatric:        Mood and Affect: Mood normal.        Behavior: Behavior normal.        Thought Content: Thought content normal.  Judgment: Judgment normal.     Assessment & Plan:  DOE (dyspnea on exertion) Assessment & Plan: Cxr ordered Bnp ordered Ddx lung process vs chf  Discussed red flag precautions Echo reviewed  Orders: -     Brain natriuretic peptide; Future -     DG Chest 2 View; Future  Primary hypertension Assessment & Plan: Increase carvedilol 25 mg twice daily  F/u cardiology  Increase fluids  Pt advised of the following:  Continue medication as prescribed. Monitor blood pressure periodically and/or when you feel symptomatic. Goal is <130/90 on average. Ensure that you have rested for 30 minutes prior to checking your blood pressure. Record your readings and bring them to your next visit if necessary.work on a low sodium diet.   Orders: -     Carvedilol; Take 1 tablet (25 mg total) by mouth 2 (two) times daily with a meal.  Dispense: 60 tablet; Refill: 3  Oropharyngeal dysphagia Assessment & Plan: Suspect gerd vs thyroid, treating with pantoprazole and ordered thyroid u/s However will order swallow study pending the results of these tests   Orders: -     DG ESOPHAGUS W DOUBLE CM (HD); Future  Abnormal weight gain -     Brain natriuretic peptide; Future -     TSH; Future -     T4, free; Future -     T3, free; Future  Heartburn -     Pantoprazole Sodium; Take 1 tablet (40 mg total) by mouth daily.  Dispense: 30 tablet; Refill: 0  Epigastric pain Assessment & Plan: H pylori ordered Treating for erosion heartburn  Pantoprazole 40 mg once daily x 30 days Try to decrease and or avoid spicy foods, fried fatty foods, and also caffeine and chocolate as these can increase heartburn  symptoms.    Orders: -     H. pylori breath test  Thyroid pain Assessment & Plan: Thyroid panel ordered U/s thyroid ordered Along with abn weight gain r/o  Could be from acid reflux as well  Trial pantoprazole  Orders: -     US THYROID; Future     Follow up plan: No follow-ups on file.  Mort Sawyers, FNP

## 2023-07-02 NOTE — Assessment & Plan Note (Signed)
 H pylori ordered Treating for erosion heartburn  Pantoprazole 40 mg once daily x 30 days Try to decrease and or avoid spicy foods, fried fatty foods, and also caffeine and chocolate as these can increase heartburn symptoms.

## 2023-07-03 ENCOUNTER — Telehealth: Payer: Self-pay

## 2023-07-03 ENCOUNTER — Other Ambulatory Visit: Payer: Self-pay

## 2023-07-03 ENCOUNTER — Encounter: Payer: Self-pay | Admitting: Family

## 2023-07-03 ENCOUNTER — Encounter: Payer: Self-pay | Admitting: *Deleted

## 2023-07-03 DIAGNOSIS — J90 Pleural effusion, not elsewhere classified: Secondary | ICD-10-CM

## 2023-07-03 LAB — H. PYLORI BREATH TEST

## 2023-07-03 MED ORDER — DOXYCYCLINE HYCLATE 100 MG PO TABS
100.0000 mg | ORAL_TABLET | Freq: Two times a day (BID) | ORAL | 0 refills | Status: AC
  Filled 2023-07-03: qty 20, 10d supply, fill #0

## 2023-07-03 NOTE — Telephone Encounter (Signed)
 Copied from CRM [id number]. Topic: Clinical - Prescription Issue >> Jul 03, 2023  4:18 PM Fredrich Romans wrote: Reason for CRM: Patient needs medication amitriptyline (ELAVIL) 25 MG tablet sent to  [facility] - [place], [place] - [address]  Phone: [phone] Fax: [phone]  Instead of [place] region

## 2023-07-04 NOTE — Telephone Encounter (Signed)
 Called patient reviewed all information and repeated back to me. Will call if any questions.  ? ?

## 2023-07-04 NOTE — Progress Notes (Signed)
 Cardiology Clinic Note   Date: 07/07/2023 ID: [name], DOB 02-01-1971, MRN [id number]  Primary Cardiologist:  Debbe Odea, MD  Chief Complaint   James House. is a 53 y.o. male who presents to the clinic today for routine follow-up.  Patient Profile   James House. is followed by Dr. Azucena Cecil for the history outlined below.      Past medical history significant for: Hypertension. CKD stage III. Multiple myeloma. Currently on chemo. Echo 10/04/2022: EF 55 to 60%.  No RWMA.  Grade I DD.  Normal RV size/function.  Normal PA pressure.  Mild MR.   In summary, patient was first seen in the office on 02/06/2023 for evaluation of shortness of breath and increasing BP with no cardiac history at the request of Dr. Chestine Spore. PCP increased amlodipine and benazepril secondary to elevated BP. He recently started new chemo medication with noted side effect of hypertension and increased heart rate. He does report elevated heart rate and occasional palpitations with typical HR in the high 90s to low 100s. Patient was started on carvedilol.  Patient was seen in follow-up in November 2024 after medication changes.  BP continued to be elevated with home readings 158-160/101-120.  He reported mild dizziness and flushed face with BP was elevated however symptoms were also a side effect of his chemotherapy.  Carvedilol was increased.  Patient was last seen in the office by me on 04/07/2023 for follow-up after medication changes.  Patient reported BP trending down and better controlled overall.  He brought his home monitor which revealed home readings slightly higher than manual BP but not definitive to obtain a new cuff.  His medications were continued and he was instructed to continue to monitor BP at home.     History of Present Illness    Today, patient is doing about the same. He obtained a new home BP cuff. BP has been running in the 140s/90s after medication. He reports when BP is  high he gets headaches and positional dizziness. He continues to walk with a cane for stability. His PCP recently increased carvedilol to 25 mg bid. He reports recently feeling dyspneic with walking "like my breath is getting stuck." He also reports some abdominal bloating/tightness. Home weight has been stable. He is considering stopping chemo.     ROS: All other systems reviewed and are otherwise negative except as noted in History of Present Illness.  EKGs/Labs Reviewed        07/01/2023: ALT 25; AST 27; BUN 12; Creatinine 1.43; Potassium 3.6; Sodium 137   07/01/2023: Hemoglobin 14.2; WBC Count 8.1   07/02/2023: TSH 1.36   01/21/2023: B Natriuretic Peptide 27.9 07/02/2023: Pro B Natriuretic peptide (BNP) 51.0   Risk Assessment/Calculations      HYPERTENSION CONTROL Vitals:   07/07/23 1036 07/07/23 1118  BP: (!) 148/102 (!) 140/88    The patient's blood pressure is elevated above target today.  In order to address the patient's elevated BP: A new medication was prescribed today.           Physical Exam    VS:  BP (!) 140/88 (BP Location: Right Arm, Patient Position: Sitting, Cuff Size: Normal)   Pulse (!) 113   Ht 5\' 7"  (1.702 m)   Wt 183 lb 3.2 oz (83.1 kg)   SpO2 96%   BMI 28.69 kg/m  , BMI Body mass index is 28.69 kg/m.  GEN: Well nourished, well developed, in no acute distress. Neck:  No JVD or carotid bruits. Cardiac:  RRR. No murmurs. No rubs or gallops.   Respiratory:  Respirations regular and unlabored. Clear to auscultation without rales, wheezing or rhonchi. GI: Soft, nontender, nondistended. Extremities: Radials/DP/PT 2+ and equal bilaterally. No clubbing or cyanosis. No edema.  Skin: Warm and dry, no rash. Neuro: Strength intact.  Assessment & Plan   Hypertension Elevation in BP secondary to chemo.  BP today 148/102 on intake and 140/88 on my recheck.. Home BP readings typically in the 140s/90s after medications.  He reports when BP is high he gets  headaches and has positional dizziness.  -Continue amlodipine-benazepril, carvedilol. -Add hydrochlorothiazide 25 mg daily. -He is scheduled for repeat labs at the cancer center on 4/1.  I will make adjustments to hydrochlorothiazide based on lab results.  If kidney function worsens would consider stopping HCTZ and trying hydralazine.  DOE Patient reports recent onset of dyspnea with exertion.  He also feels abdominal bloating/tightness.  Home weight has been stable.  No lower extremity edema. Euvolemic and well compensated on exam. -Schedule echo. -HCTZ as above.   Risk of Falls Patient has a history of falls and is now using a cane to prevent further incidents. No recent falls reported. -Continue use of cane to prevent falls.  Disposition: Echo.  Add HCTZ 25 mg daily.  Return in 2 months or sooner as needed.         Signed, Etta Grandchild. Lynnel Zanetti, DNP, NP-C

## 2023-07-04 NOTE — Telephone Encounter (Signed)
 This prescription was sent to [facility] on 07/02/23. Please have him check with his pharmacy if there are issues.

## 2023-07-07 ENCOUNTER — Other Ambulatory Visit: Payer: Self-pay

## 2023-07-07 ENCOUNTER — Ambulatory Visit: Payer: Medicare HMO | Attending: Student | Admitting: Student

## 2023-07-07 ENCOUNTER — Encounter: Payer: Self-pay | Admitting: Student

## 2023-07-07 VITALS — BP 140/88 | HR 113 | Ht 67.0 in | Wt 183.2 lb

## 2023-07-07 DIAGNOSIS — R0609 Other forms of dyspnea: Secondary | ICD-10-CM | POA: Diagnosis not present

## 2023-07-07 DIAGNOSIS — I10 Essential (primary) hypertension: Secondary | ICD-10-CM

## 2023-07-07 DIAGNOSIS — Z9181 History of falling: Secondary | ICD-10-CM

## 2023-07-07 MED ORDER — HYDROCHLOROTHIAZIDE 25 MG PO TABS
25.0000 mg | ORAL_TABLET | Freq: Every day | ORAL | 3 refills | Status: AC
Start: 2023-07-07 — End: 2023-10-07
  Filled 2023-07-07: qty 90, 90d supply, fill #0

## 2023-07-07 NOTE — Patient Instructions (Signed)
 Medication Instructions:  Your physician recommends the following medication changes.  START TAKING: Hydrochlorothiazide (HCTZ) 25 mg daily   *If you need a refill on your cardiac medications before your next appointment, please call your pharmacy*   Lab Work: None ordered at this time    Testing/Procedures: Your physician has requested that you have an echocardiogram. Echocardiography is a painless test that uses sound waves to create images of your heart. It provides your doctor with information about the size and shape of your heart and how well your heart's chambers and valves are working.   You may receive an ultrasound enhancing agent through an IV if needed to better visualize your heart during the echo. This procedure takes approximately one hour.  There are no restrictions for this procedure.  This will take place at [address] [facility]) [address], [place] [address]  Please note: We ask at that you not bring children with you during ultrasound (echo/ vascular) testing. Due to room size and safety concerns, children are not allowed in the ultrasound rooms during exams. Our front office staff cannot provide observation of children in our lobby area while testing is being conducted. An adult accompanying a patient to their appointment will only be allowed in the ultrasound room at the discretion of the ultrasound technician under special circumstances. We apologize for any inconvenience.   Follow-Up: At [facility], you and your health needs are our priority.  As part of our continuing mission to provide you with exceptional heart care, we have created designated Provider Care Teams.  These Care Teams include your primary Cardiologist (physician) and Advanced Practice Providers (APPs -  Physician Assistants and Nurse Practitioners) who all work together to provide you with the care you need, when you need it.   Your next appointment:   2  month(s)  Provider:   You may see Debbe Odea, MD or Carlos Levering, NP

## 2023-07-09 ENCOUNTER — Ambulatory Visit
Admission: RE | Admit: 2023-07-09 | Discharge: 2023-07-09 | Disposition: A | Discharge status: Home / Self Care | Source: Ambulatory Visit | Attending: Family | Admitting: Family

## 2023-07-09 DIAGNOSIS — R1310 Dysphagia, unspecified: Secondary | ICD-10-CM | POA: Diagnosis not present

## 2023-07-09 DIAGNOSIS — E0789 Other specified disorders of thyroid: Secondary | ICD-10-CM | POA: Diagnosis not present

## 2023-07-10 ENCOUNTER — Inpatient Hospital Stay

## 2023-07-10 NOTE — Progress Notes (Signed)
 [facility] Healthcare Advance Directives Clinical Social Work  Patient presented to Advance Directives Clinic  to review and complete healthcare advance directives.  Clinical Social Worker met with patient.  The patient designated [name] as their primary healthcare agent and Sabino Denning as their secondary agent.  Patient also completed healthcare living will.    Documents were notarized and copies made for patient/family. Clinical Social Worker will send documents to medical records to be scanned into patient's chart. Clinical Social Worker encouraged patient/family to contact with any additional questions or concerns.   Dorothey Baseman, LCSW Clinical Social Worker [facility]

## 2023-07-13 DIAGNOSIS — G4733 Obstructive sleep apnea (adult) (pediatric): Secondary | ICD-10-CM | POA: Diagnosis not present

## 2023-07-14 ENCOUNTER — Encounter: Payer: Self-pay | Admitting: Family

## 2023-07-15 ENCOUNTER — Inpatient Hospital Stay: Payer: Medicare HMO

## 2023-07-15 VITALS — BP 130/84 | HR 88 | Temp 98.7°F | Resp 18 | Wt 182.5 lb

## 2023-07-15 DIAGNOSIS — Z5112 Encounter for antineoplastic immunotherapy: Secondary | ICD-10-CM | POA: Diagnosis not present

## 2023-07-15 DIAGNOSIS — Z5111 Encounter for antineoplastic chemotherapy: Secondary | ICD-10-CM

## 2023-07-15 DIAGNOSIS — C9002 Multiple myeloma in relapse: Secondary | ICD-10-CM

## 2023-07-15 LAB — CBC WITH DIFFERENTIAL (CANCER CENTER ONLY)
Abs Immature Granulocytes: 0.05 10*3/uL (ref 0.00–0.07)
Basophils Absolute: 0.1 10*3/uL (ref 0.0–0.1)
Basophils Relative: 1 %
Eosinophils Absolute: 0.7 10*3/uL — ABNORMAL HIGH (ref 0.0–0.5)
Eosinophils Relative: 8 %
HCT: 39.4 % (ref 39.0–52.0)
Hemoglobin: 13.6 g/dL (ref 13.0–17.0)
Immature Granulocytes: 1 %
Lymphocytes Relative: 14 %
Lymphs Abs: 1.2 10*3/uL (ref 0.7–4.0)
MCH: 32.1 pg (ref 26.0–34.0)
MCHC: 34.5 g/dL (ref 30.0–36.0)
MCV: 92.9 fL (ref 80.0–100.0)
Monocytes Absolute: 0.6 10*3/uL (ref 0.1–1.0)
Monocytes Relative: 8 %
Neutro Abs: 5.8 10*3/uL (ref 1.7–7.7)
Neutrophils Relative %: 68 %
Platelet Count: 185 10*3/uL (ref 150–400)
RBC: 4.24 MIL/uL (ref 4.22–5.81)
RDW: 14.7 % (ref 11.5–15.5)
WBC Count: 8.4 10*3/uL (ref 4.0–10.5)
nRBC: 0 % (ref 0.0–0.2)

## 2023-07-15 LAB — CMP (CANCER CENTER ONLY)
ALT: 17 U/L (ref 0–44)
AST: 19 U/L (ref 15–41)
Albumin: 3.4 g/dL — ABNORMAL LOW (ref 3.5–5.0)
Alkaline Phosphatase: 76 U/L (ref 38–126)
Anion gap: 10 (ref 5–15)
BUN: 8 mg/dL (ref 6–20)
CO2: 24 mmol/L (ref 22–32)
Calcium: 8.8 mg/dL — ABNORMAL LOW (ref 8.9–10.3)
Chloride: 103 mmol/L (ref 98–111)
Creatinine: 1.56 mg/dL — ABNORMAL HIGH (ref 0.61–1.24)
GFR, Estimated: 53 mL/min — ABNORMAL LOW (ref >60)
Glucose, Bld: 111 mg/dL — ABNORMAL HIGH (ref 70–99)
Potassium: 3.6 mmol/L (ref 3.5–5.1)
Sodium: 137 mmol/L (ref 135–145)
Total Bilirubin: 1.2 mg/dL (ref 0.0–1.2)
Total Protein: 6.3 g/dL — ABNORMAL LOW (ref 6.5–8.1)

## 2023-07-15 MED ORDER — CARFILZOMIB CHEMO INJECTION 60 MG
20.0000 mg/m2 | Freq: Once | INTRAVENOUS | Status: AC
  Administered 2023-07-15: 36 mg via INTRAVENOUS
  Filled 2023-07-15: qty 15

## 2023-07-15 MED ORDER — SODIUM CHLORIDE 0.9 % IV SOLN
Freq: Once | INTRAVENOUS | Status: AC
Start: 2023-07-15 — End: 2023-07-15
  Filled 2023-07-15: qty 250

## 2023-07-15 MED ORDER — DEXAMETHASONE SODIUM PHOSPHATE 10 MG/ML IJ SOLN
10.0000 mg | Freq: Once | INTRAMUSCULAR | Status: AC
  Administered 2023-07-15: 10 mg via INTRAVENOUS

## 2023-07-15 MED ORDER — HEPARIN SOD (PORK) LOCK FLUSH 100 UNIT/ML IV SOLN
500.0000 [IU] | Freq: Once | INTRAVENOUS | Status: AC | PRN
  Administered 2023-07-15: 500 [IU]
  Filled 2023-07-15: qty 5

## 2023-07-15 NOTE — Patient Instructions (Signed)

## 2023-07-16 DIAGNOSIS — G4733 Obstructive sleep apnea (adult) (pediatric): Secondary | ICD-10-CM | POA: Diagnosis not present

## 2023-07-16 LAB — KAPPA/LAMBDA LIGHT CHAINS
Kappa free light chain: 65.9 mg/L — ABNORMAL HIGH (ref 3.3–19.4)
Kappa, lambda light chain ratio: 6.66 — ABNORMAL HIGH (ref 0.26–1.65)
Lambda free light chains: 9.9 mg/L (ref 5.7–26.3)

## 2023-07-17 LAB — MULTIPLE MYELOMA PANEL, SERUM
Albumin SerPl Elph-Mcnc: 3.2 g/dL (ref 2.9–4.4)
Albumin/Glob SerPl: 1.4 (ref 0.7–1.7)
Alpha 1: 0.2 g/dL (ref 0.0–0.4)
Alpha2 Glob SerPl Elph-Mcnc: 0.7 g/dL (ref 0.4–1.0)
B-Globulin SerPl Elph-Mcnc: 0.9 g/dL (ref 0.7–1.3)
Gamma Glob SerPl Elph-Mcnc: 0.5 g/dL (ref 0.4–1.8)
Globulin, Total: 2.4 g/dL (ref 2.2–3.9)
IgA: 99 mg/dL (ref 90–386)
IgG (Immunoglobin G), Serum: 630 mg/dL (ref 603–1613)
IgM (Immunoglobulin M), Srm: 43 mg/dL (ref 20–172)
Total Protein ELP: 5.6 g/dL — ABNORMAL LOW (ref 6.0–8.5)

## 2023-07-21 ENCOUNTER — Other Ambulatory Visit: Payer: Self-pay | Admitting: Oncology

## 2023-07-21 DIAGNOSIS — C9002 Multiple myeloma in relapse: Secondary | ICD-10-CM

## 2023-07-22 ENCOUNTER — Inpatient Hospital Stay (HOSPITAL_BASED_OUTPATIENT_CLINIC_OR_DEPARTMENT_OTHER): Payer: Medicare HMO | Admitting: Oncology

## 2023-07-22 ENCOUNTER — Encounter: Payer: Self-pay | Admitting: Oncology

## 2023-07-22 ENCOUNTER — Telehealth: Payer: Self-pay | Admitting: *Deleted

## 2023-07-22 ENCOUNTER — Inpatient Hospital Stay: Payer: Medicare HMO | Attending: Internal Medicine

## 2023-07-22 ENCOUNTER — Inpatient Hospital Stay: Payer: Medicare HMO

## 2023-07-22 ENCOUNTER — Other Ambulatory Visit: Payer: Self-pay

## 2023-07-22 VITALS — BP 127/98 | HR 82

## 2023-07-22 VITALS — BP 125/90 | HR 96 | Temp 97.2°F | Resp 18 | Ht 67.0 in | Wt 178.9 lb

## 2023-07-22 DIAGNOSIS — Z5112 Encounter for antineoplastic immunotherapy: Secondary | ICD-10-CM | POA: Insufficient documentation

## 2023-07-22 DIAGNOSIS — Z79899 Other long term (current) drug therapy: Secondary | ICD-10-CM | POA: Diagnosis not present

## 2023-07-22 DIAGNOSIS — T451X5A Adverse effect of antineoplastic and immunosuppressive drugs, initial encounter: Secondary | ICD-10-CM | POA: Diagnosis not present

## 2023-07-22 DIAGNOSIS — G620 Drug-induced polyneuropathy: Secondary | ICD-10-CM

## 2023-07-22 DIAGNOSIS — C9002 Multiple myeloma in relapse: Secondary | ICD-10-CM | POA: Diagnosis not present

## 2023-07-22 DIAGNOSIS — I509 Heart failure, unspecified: Secondary | ICD-10-CM

## 2023-07-22 DIAGNOSIS — C9000 Multiple myeloma not having achieved remission: Secondary | ICD-10-CM

## 2023-07-22 DIAGNOSIS — Z5111 Encounter for antineoplastic chemotherapy: Secondary | ICD-10-CM

## 2023-07-22 LAB — CMP (CANCER CENTER ONLY)
ALT: 22 U/L (ref 0–44)
AST: 29 U/L (ref 15–41)
Albumin: 3.4 g/dL — ABNORMAL LOW (ref 3.5–5.0)
Alkaline Phosphatase: 86 U/L (ref 38–126)
Anion gap: 7 (ref 5–15)
BUN: 8 mg/dL (ref 6–20)
CO2: 23 mmol/L (ref 22–32)
Calcium: 8.7 mg/dL — ABNORMAL LOW (ref 8.9–10.3)
Chloride: 105 mmol/L (ref 98–111)
Creatinine: 1.32 mg/dL — ABNORMAL HIGH (ref 0.61–1.24)
GFR, Estimated: >60 mL/min (ref >60)
Glucose, Bld: 112 mg/dL — ABNORMAL HIGH (ref 70–99)
Potassium: 3.9 mmol/L (ref 3.5–5.1)
Sodium: 135 mmol/L (ref 135–145)
Total Bilirubin: 1.1 mg/dL (ref 0.0–1.2)
Total Protein: 6.1 g/dL — ABNORMAL LOW (ref 6.5–8.1)

## 2023-07-22 LAB — CBC WITH DIFFERENTIAL (CANCER CENTER ONLY)
Abs Immature Granulocytes: 0.08 10*3/uL — ABNORMAL HIGH (ref 0.00–0.07)
Basophils Absolute: 0.1 10*3/uL (ref 0.0–0.1)
Basophils Relative: 1 %
Eosinophils Absolute: 1 10*3/uL — ABNORMAL HIGH (ref 0.0–0.5)
Eosinophils Relative: 15 %
HCT: 40.2 % (ref 39.0–52.0)
Hemoglobin: 13.7 g/dL (ref 13.0–17.0)
Immature Granulocytes: 1 %
Lymphocytes Relative: 19 %
Lymphs Abs: 1.3 10*3/uL (ref 0.7–4.0)
MCH: 31.7 pg (ref 26.0–34.0)
MCHC: 34.1 g/dL (ref 30.0–36.0)
MCV: 93.1 fL (ref 80.0–100.0)
Monocytes Absolute: 0.6 10*3/uL (ref 0.1–1.0)
Monocytes Relative: 9 %
Neutro Abs: 3.6 10*3/uL (ref 1.7–7.7)
Neutrophils Relative %: 55 %
Platelet Count: 235 10*3/uL (ref 150–400)
RBC: 4.32 MIL/uL (ref 4.22–5.81)
RDW: 14.5 % (ref 11.5–15.5)
WBC Count: 6.6 10*3/uL (ref 4.0–10.5)
nRBC: 0 % (ref 0.0–0.2)

## 2023-07-22 MED ORDER — SODIUM CHLORIDE 0.9 % IV SOLN
Freq: Once | INTRAVENOUS | Status: AC
Start: 2023-07-22 — End: 2023-07-22
  Filled 2023-07-22: qty 250

## 2023-07-22 MED ORDER — DEXTROSE 5 % IV SOLN
20.0000 mg/m2 | Freq: Once | INTRAVENOUS | Status: AC
  Administered 2023-07-22: 36 mg via INTRAVENOUS
  Filled 2023-07-22: qty 13

## 2023-07-22 MED ORDER — POMALIDOMIDE 2 MG PO CAPS: 2.0000 mg | ORAL_CAPSULE | Freq: Every day | ORAL | 0 refills | Status: DC

## 2023-07-22 MED ORDER — HEPARIN SOD (PORK) LOCK FLUSH 100 UNIT/ML IV SOLN
500.0000 [IU] | Freq: Once | INTRAVENOUS | Status: AC | PRN
  Administered 2023-07-22: 500 [IU]
  Filled 2023-07-22: qty 5

## 2023-07-22 MED ORDER — SODIUM CHLORIDE 0.9% FLUSH
10.0000 mL | INTRAVENOUS | Status: DC | PRN
  Filled 2023-07-22: qty 10

## 2023-07-22 MED ORDER — SODIUM CHLORIDE 0.9% FLUSH
10.0000 mL | Freq: Once | INTRAVENOUS | Status: AC
  Administered 2023-07-22: 10 mL via INTRAVENOUS
  Filled 2023-07-22: qty 10

## 2023-07-22 MED ORDER — DEXAMETHASONE 4 MG PO TABS
20.0000 mg | ORAL_TABLET | Freq: Once | ORAL | Status: AC
  Administered 2023-07-22: 20 mg via ORAL
  Filled 2023-07-22: qty 5

## 2023-07-22 NOTE — Progress Notes (Signed)
 Hematology/Oncology Consult note [facility]  Telephone:([phone] [phone]  Patient Care Team: Doreene Nest, NP as PCP - General (Internal Medicine) Martin Majestic, MD as PCP - Hematology/Oncology Debbe Odea, MD as PCP - Cardiology (Cardiology) Creig Hines, MD as Consulting Physician (Hematology and Oncology)   Name of the patient: James House  [id number]  Jun 03, 1970   Date of visit: 07/22/23  Diagnosis- recurrent kappa light chain multiple myeloma in relapse   Chief complaint/ Reason for visit-on treatment assessment prior to cycle 13-day 8 of Kyprolis  Heme/Onc history: Patient is a 53- year-old male with a history of kappa light chain myeloma that was treated by [name].  History is as follows:   1.  Patient presented with renal insufficiency with a creatinine of 2.4 in April 2018.  Kidney biopsy showed myeloma cast nephropathy.  Kappa light chain was 3004 and lambda 0.22 with a kappa lambda ratio of 13,000 655.  Skeletal survey showed multiple calvarial and marrow lesions.  Bone marrow biopsy in May 2018 showed 43% plasma cells.  He received CyBorD for cycle 1 in May 2018 and was subsequently switched to RVD.  He received 4 cycles followed by a repeat bone marrow biopsy in August 2018 which showed no increase in plasma cells.   2.  He underwent autologous stem cell transplantation on 01/30/2017.  He was started on Revlimid maintenance 10 mg daily in January 2019.   3.  Labs from January February and March 2019 done at [facility] showed no M spike, normal kappa lambda ratio of 1.06.  He was last seen by them in April 2019.   4. Following that patient was admitted to [facility] for healthcare associated pneumonia and was recently discharged.  He wished to transfer his care to [place] at this time.  He was on maintenance Revlimid 5 mg which was then reduced to 2.5 mg.  Follows up with pain clinic for chemo-induced peripheral neuropathy with  [name].  His urine tested positive for [facility] and therefore patient was discharged from pain clinic   5.  Noted to have gradually increasing free kappa light chainFrom 15 in 20 21-39.5 with a ratio of 3.09.  He was seen by [facility] again and underwent a repeat bone marrow biopsy which did not show any evidence of clonal plasma cells.  PET CT scan also did not show any new active lytic lesions in May 2023.Marland Kitchen  Revlimid dose was increased to 15 mg 3 weeks on 1 week off but kappa light chains show continuing to increase   6.  PET CT scan in December 2023 did not show any evidence of active myeloma.  Patient underwent repeat bone marrow biopsy at [facility] which showed 6% plasma cellsConsistent with persistent plasma cell neoplasm.  74 to 86% of isolated plasma cells show loss of T p53 and monosomy 13.  Patient was seen by [name] at [facility] and was recommended Darzalex Pomalyst dexamethasone regimen   7.  Disease progression after 8 weekly cycles of Darazalex kappa light chain increased from 332-646 with a ratio that went up from 39-57 concerning for disease progression.  Patient switched to carfilzomib Pomalyst dexamethasone regimen    Interval history-he has baseline fatigue and neuropathy in his feet.  He ambulates with a cane.  He is able to get around the house but is otherwise not very active  ECOG PS- 2 Pain scale- 4   Review of systems- Review of Systems  Constitutional:  Positive for malaise/fatigue. Negative for chills, fever and weight loss.  HENT:  Negative for congestion, ear discharge and nosebleeds.   Eyes:  Negative for blurred vision.  Respiratory:  Negative for cough, hemoptysis, sputum production, shortness of breath and wheezing.   Cardiovascular:  Negative for chest pain, palpitations, orthopnea and claudication.  Gastrointestinal:  Negative for abdominal pain, blood in stool, constipation, diarrhea, heartburn, melena, nausea and vomiting.  Genitourinary:  Negative for dysuria, flank  pain, frequency, hematuria and urgency.  Musculoskeletal:  Positive for back pain. Negative for joint pain and myalgias.  Skin:  Negative for rash.  Neurological:  Positive for sensory change (Peripheral neuropathy). Negative for dizziness, tingling, focal weakness, seizures, weakness and headaches.  Endo/Heme/Allergies:  Does not bruise/bleed easily.  Psychiatric/Behavioral:  Negative for depression and suicidal ideas. The patient does not have insomnia.       Allergies  Allergen Reactions   Morphine Nausea And Vomiting     Past Medical History:  Diagnosis Date   CKD (chronic kidney disease) stage 3, GFR 30-59 ml/min (HCC)    Elevated sedimentation rate 12/24/2017   Hypertension    Marijuana use 03/28/2021   ABNORMAL (12/04/2020) UDS (+) Carboxy-THC (Marijuana)     Multiple myeloma (HCC) 08/11/2017   Neuropathy    Pneumonia    Severe sepsis (HCC) 08/11/2017   Shingles 08/03/2019     Past Surgical History:  Procedure Laterality Date   ABDOMINAL SURGERY     COLONOSCOPY WITH PROPOFOL N/A 05/25/2021   Procedure: COLONOSCOPY WITH PROPOFOL;  Surgeon: Toney Reil, MD;  Location: [facility] ENDOSCOPY;  Service: Gastroenterology;  Laterality: N/A;   IR IMAGING GUIDED PORT INSERTION  08/02/2022   JOINT REPLACEMENT     right knee   KNEE SURGERY Left     Social History   Socioeconomic History   Marital status: Single    Spouse name: [name]   Number of children: 3   Years of education: Not on file   Highest education level: Not on file  Occupational History   Not on file  Tobacco Use   Smoking status: Former   Smokeless tobacco: Current    Types: Snuff  Vaping Use   Vaping status: Never Used  Substance and Sexual Activity   Alcohol use: No   Drug use: No   Sexual activity: Yes  Other Topics Concern   Not on file  Social History Narrative   Live in private residence with spouse and mother in law   Social Drivers of Health   Financial Resource Strain: Low Risk   (08/12/2017)   Overall Financial Resource Strain (CARDIA)    Difficulty of Paying Living Expenses: Not hard at all  Food Insecurity: No Food Insecurity (08/12/2017)   Hunger Vital Sign    Worried About Running Out of Food in the Last Year: Never true    Ran Out of Food in the Last Year: Never true  Transportation Needs: No Transportation Needs (08/12/2017)   PRAPARE - Administrator, Civil Service (Medical): No    Lack of Transportation (Non-Medical): No  Physical Activity: Sufficiently Active (08/12/2017)   Exercise Vital Sign    Days of Exercise per Week: 7 days    Minutes of Exercise per Session: 30 min  Stress: No Stress Concern Present (08/12/2017)   [employer] of Occupational Health - Occupational Stress Questionnaire    Feeling of Stress : Only a little  Social Connections: Somewhat Isolated (08/12/2017)   Social Connection and Isolation Panel [  NHANES]    Frequency of Communication with Friends and Family: Once a week    Frequency of Social Gatherings with Friends and Family: Once a week    Attends Religious Services: More than 4 times per year    Active Member of [employer] or Organizations: No    Attends Banker Meetings: Never    Marital Status: Married  Catering manager Violence: Not At Risk (08/12/2017)   Humiliation, Afraid, Rape, and Kick questionnaire    Fear of Current or Ex-Partner: No    Emotionally Abused: No    Physically Abused: No    Sexually Abused: No    Family History  Problem Relation Age of Onset   Heart disease Mother    Cancer Mother 37       Pancreatic   COPD Mother    Diabetes Mother    Hyperlipidemia Mother    Hypertension Mother    Cancer Maternal Uncle        pancreatic   Heart disease Maternal Grandmother    Cancer Maternal Grandmother 86       colon   COPD Maternal Grandmother    Diabetes Maternal Grandmother    Hypertension Maternal Grandmother      Current Outpatient Medications:    acyclovir (ZOVIRAX) 400  MG tablet, Take 1 tablet (400 mg total) by mouth 2 (two) times daily., Disp: 60 tablet, Rfl: 2   albuterol (VENTOLIN HFA) 108 (90 Base) MCG/ACT inhaler, Inhale 2 puffs into the lungs every 6 (six) hours as needed for wheezing or shortness of breath., Disp: 6.7 g, Rfl: 6   amitriptyline (ELAVIL) 25 MG tablet, Take 1 tablet (25 mg total) by mouth at bedtime. For headache prevention and sleep, Disp: 90 tablet, Rfl: 1   amLODipine-benazepril (LOTREL) 10-40 MG capsule, Take 1 capsule by mouth daily. for blood pressure., Disp: 90 capsule, Rfl: 1   aspirin EC 81 MG tablet, Take 1 tablet (81 mg total) by mouth daily., Disp: 30 tablet, Rfl: 2   baclofen (LIORESAL) 10 MG tablet, Take 1 tablet (10 mg total) by mouth 4 (four) times daily as needed for muscle spasms., Disp: 360 tablet, Rfl: 1   carvedilol (COREG) 25 MG tablet, Take 1 tablet (25 mg total) by mouth 2 (two) times daily with a meal., Disp: 60 tablet, Rfl: 3   celecoxib (CELEBREX) 200 MG capsule, Take 1 capsule (200 mg total) by mouth daily., Disp: 30 capsule, Rfl: 2   cyanocobalamin (VITAMIN B12) 1000 MCG/ML injection, Inject 1 mL (1,000 mcg total) into the muscle every 30 (thirty) days., Disp: 1 mL, Rfl: 11   DULoxetine (CYMBALTA) 60 MG capsule, Take 1 capsule (60 mg total) by mouth daily., Disp: 60 capsule, Rfl: 1   hydrochlorothiazide (HYDRODIURIL) 25 MG tablet, Take 1 tablet (25 mg total) by mouth daily., Disp: 90 tablet, Rfl: 3   lidocaine-prilocaine (EMLA) cream, Apply 1 Application topically as needed., Disp: 30 g, Rfl: 0   methocarbamol (ROBAXIN-750) 750 MG tablet, Take 1 tablet (750 mg total) by mouth 3 (three) times daily., Disp: 90 tablet, Rfl: 0   OLANZapine (ZYPREXA) 10 MG tablet, Take 1 tablet (10 mg total) by mouth at bedtime. To help with nausea prevention., Disp: 30 tablet, Rfl: 1   oxyCODONE (ROXICODONE) 15 MG immediate release tablet, Take 1 tablet (15 mg total) by mouth every 8 (eight) hours as needed for pain., Disp: 90 tablet,  Rfl: 0   pantoprazole (PROTONIX) 40 MG tablet, Take 1 tablet (40 mg total) by  mouth daily., Disp: 30 tablet, Rfl: 0   pomalidomide (POMALYST) 2 MG capsule, Take 1 capsule (2 mg total) by mouth daily. Take for 21 days, then hold for 7 days. Repeat every 28 days., Disp: 21 capsule, Rfl: 0   Potassium Chloride ER 20 MEQ TBCR, Take 1 tablet by mouth 2 (two) times daily., Disp: , Rfl:    potassium chloride SA (KLOR-CON M) 20 MEQ tablet, Take 2 tablets (40 mEq total) by mouth daily., Disp: 60 tablet, Rfl: 2   pregabalin (LYRICA) 300 MG capsule, TAKE ONE CAPSULE (300MG  TOTAL) BY MOUTH TWICE DAILY, Disp: 180 capsule, Rfl: 0   prochlorperazine (COMPAZINE) 10 MG tablet, Take by mouth every 8 (eight) hours as needed., Disp: , Rfl:   Physical exam: There were no vitals filed for this visit. Physical Exam Constitutional:      Comments: Ambulates with a cane.  Gait is slow  Cardiovascular:     Rate and Rhythm: Normal rate and regular rhythm.     Heart sounds: Normal heart sounds.  Pulmonary:     Effort: Pulmonary effort is normal.     Breath sounds: Normal breath sounds.  Abdominal:     General: Bowel sounds are normal.     Palpations: Abdomen is soft.  Skin:    General: Skin is warm and dry.  Neurological:     Mental Status: He is alert and oriented to person, place, and time.      I have personally reviewed labs listed below:    Latest Ref Rng & Units 07/15/2023    8:48 AM  CMP  Glucose 70 - 99 mg/dL 161   BUN 6 - 20 mg/dL 8   Creatinine 0.96 - 0.45 mg/dL 4.09   Sodium 811 - 914 mmol/L 137   Potassium 3.5 - 5.1 mmol/L 3.6   Chloride 98 - 111 mmol/L 103   CO2 22 - 32 mmol/L 24   Calcium 8.9 - 10.3 mg/dL 8.8   Total Protein 6.5 - 8.1 g/dL 6.3   Total Bilirubin 0.0 - 1.2 mg/dL 1.2   Alkaline Phos 38 - 126 U/L 76   AST 15 - 41 U/L 19   ALT 0 - 44 U/L 17       Latest Ref Rng & Units 07/15/2023    8:48 AM  CBC  WBC 4.0 - 10.5 K/uL 8.4   Hemoglobin 13.0 - 17.0 g/dL 78.2   Hematocrit  95.6 - 52.0 % 39.4   Platelets 150 - 400 K/uL 185    I have personally reviewed Radiology images listed below: No images are attached to the encounter.  US THYROID Result Date: 07/13/2023 CLINICAL DATA:  Other.  Dysphagia EXAM: THYROID ULTRASOUND TECHNIQUE: Ultrasound examination of the thyroid gland and adjacent soft tissues was performed. COMPARISON:  None Available. FINDINGS: Parenchymal Echotexture: Normal Isthmus: 0.3 cm Right lobe: 4.3 x 1.7 x 1.6 cm Left lobe: 4.9 x 1.4 x 1.0 cm _________________________________________________________ Estimated total number of nodules >/= 1 cm: 0 Number of spongiform nodules >/=  2 cm not described below (TR1): 0 Number of mixed cystic and solid nodules >/= 1.5 cm not described below (TR2): 0 _________________________________________________________ No discrete nodules are seen within the thyroid gland. IMPRESSION: Normal sonographic appearance of the thyroid gland. Electronically Signed   By: Malachy Moan M.D.   On: 07/13/2023 15:46   DG Chest 2 View Result Date: 07/02/2023 CLINICAL DATA:  Dyspnea on exertion. EXAM: CHEST - 2 VIEW COMPARISON:  Sep 19, 2022. FINDINGS:  The heart size and mediastinal contours are within normal limits. Right internal jugular Port-A-Cath is unchanged. Left lung is clear. Minimal right pleural effusion is noted with minimal adjacent subsegmental atelectasis. The visualized skeletal structures are unremarkable. IMPRESSION: Minimal right pleural effusion with minimal subsegmental atelectasis. Electronically Signed   By: Lupita Raider M.D.   On: 07/02/2023 12:54     Assessment and plan- Patient is a 53 y.o. male with history of kappa light chain multiple myeloma here for on treatment assessment prior to cycle 13-day 8 of carfilzomib and Pomalyst  Okay to proceed with cycle 13-day 8 of carfilzomib today.  He will get day 15 of treatment next week and I will see him back in 3 weeks for cycle 14-day 1.  He will be continuing with  Pomalyst 2 mg 3 weeks on and 1 week off until progression or toxicity.  Serum free kappa light chain is trending up slowly.  CKD is overall stable no evidence of anemia.  He is following up with [facility] as well.  I will check echocardiogram in about 2 weeks time.  Blood pressure remains borderline high and he follows up with cardiology and PCP for this  Chemo-induced peripheral neuropathy: Continue Xtampza and as needed oxycodone  Continue aspirin and acyclovir prophylaxis   Visit Diagnosis 1. High risk medication use   2. Encounter for antineoplastic chemotherapy   3. Multiple myeloma not having achieved remission (HCC)   4. Chemotherapy-induced peripheral neuropathy (HCC)      Dr. Owens Shark, MD, MPH [facility] [phone] 07/22/2023 8:41 AM

## 2023-07-22 NOTE — Telephone Encounter (Signed)
 James House can you look into this

## 2023-07-22 NOTE — Telephone Encounter (Signed)
 [facility] please.

## 2023-07-22 NOTE — Patient Instructions (Signed)
 [facility] A DEPT OF [facility]  Discharge Instructions: Thank you for choosing [facility] to provide your oncology and hematology care.  If you have a lab appointment with the Cancer Center, please go directly to the Cancer Center and check in at the registration area.  Wear comfortable clothing and clothing appropriate for easy access to any Portacath or PICC line.   We strive to give you quality time with your provider. You may need to reschedule your appointment if you arrive late (15 or more minutes).  Arriving late affects you and other patients whose appointments are after yours.  Also, if you miss three or more appointments without notifying the office, you may be dismissed from the clinic at the provider's discretion.      For prescription refill requests, have your pharmacy contact our office and allow 72 hours for refills to be completed.    Today you received the following chemotherapy and/or immunotherapy agents Carfilzomib      To help prevent nausea and vomiting after your treatment, we encourage you to take your nausea medication as directed.  BELOW ARE SYMPTOMS THAT SHOULD BE REPORTED IMMEDIATELY: *FEVER GREATER THAN 100.4 F (38 C) OR HIGHER *CHILLS OR SWEATING *NAUSEA AND VOMITING THAT IS NOT CONTROLLED WITH YOUR NAUSEA MEDICATION *UNUSUAL SHORTNESS OF BREATH *UNUSUAL BRUISING OR BLEEDING *URINARY PROBLEMS (pain or burning when urinating, or frequent urination) *BOWEL PROBLEMS (unusual diarrhea, constipation, pain near the anus) TENDERNESS IN MOUTH AND THROAT WITH OR WITHOUT PRESENCE OF ULCERS (sore throat, sores in mouth, or a toothache) UNUSUAL RASH, SWELLING OR PAIN  UNUSUAL VAGINAL DISCHARGE OR ITCHING   Items with * indicate a potential emergency and should be followed up as soon as possible or go to the Emergency Department if any problems should occur.  Please show the CHEMOTHERAPY ALERT CARD or IMMUNOTHERAPY  ALERT CARD at check-in to the Emergency Department and triage nurse.  Should you have questions after your visit or need to cancel or reschedule your appointment, please contact [facility] - A DEPT OF [facility]  [phone] and follow the prompts.  Office hours are 8:00 a.m. to 4:30 p.m. Monday - Friday. Please note that voicemails left after 4:00 p.m. may not be returned until the following business day.  We are closed weekends and major holidays. You have access to a nurse at all times for urgent questions. Please call the main number to the clinic [phone] and follow the prompts.  For any non-urgent questions, you may also contact your provider using MyChart. We now offer e-Visits for anyone 2 and older to request care online for non-urgent symptoms. For details visit [email or web address].   Also download the MyChart app! Go to the app store, search "MyChart", open the app, select [place], and log in with your MyChart username and password.

## 2023-07-24 ENCOUNTER — Ambulatory Visit: Payer: Medicare HMO | Admitting: Adult Health

## 2023-07-24 ENCOUNTER — Encounter: Payer: Self-pay | Admitting: Oncology

## 2023-07-25 ENCOUNTER — Encounter: Payer: Self-pay | Admitting: Oncology

## 2023-07-29 ENCOUNTER — Inpatient Hospital Stay: Payer: Medicare HMO

## 2023-07-29 VITALS — BP 146/98 | HR 100 | Temp 97.0°F | Resp 17 | Wt 176.9 lb

## 2023-07-29 DIAGNOSIS — Z5112 Encounter for antineoplastic immunotherapy: Secondary | ICD-10-CM | POA: Diagnosis not present

## 2023-07-29 DIAGNOSIS — C9002 Multiple myeloma in relapse: Secondary | ICD-10-CM

## 2023-07-29 LAB — CBC WITH DIFFERENTIAL (CANCER CENTER ONLY)
Abs Immature Granulocytes: 0.15 10*3/uL — ABNORMAL HIGH (ref 0.00–0.07)
Basophils Absolute: 0.1 10*3/uL (ref 0.0–0.1)
Basophils Relative: 1 %
Eosinophils Absolute: 0.8 10*3/uL — ABNORMAL HIGH (ref 0.0–0.5)
Eosinophils Relative: 9 %
HCT: 40.9 % (ref 39.0–52.0)
Hemoglobin: 14.1 g/dL (ref 13.0–17.0)
Immature Granulocytes: 2 %
Lymphocytes Relative: 16 %
Lymphs Abs: 1.4 10*3/uL (ref 0.7–4.0)
MCH: 31.7 pg (ref 26.0–34.0)
MCHC: 34.5 g/dL (ref 30.0–36.0)
MCV: 91.9 fL (ref 80.0–100.0)
Monocytes Absolute: 0.7 10*3/uL (ref 0.1–1.0)
Monocytes Relative: 9 %
Neutro Abs: 5.6 10*3/uL (ref 1.7–7.7)
Neutrophils Relative %: 63 %
Platelet Count: 224 10*3/uL (ref 150–400)
RBC: 4.45 MIL/uL (ref 4.22–5.81)
RDW: 14.3 % (ref 11.5–15.5)
WBC Count: 8.7 10*3/uL (ref 4.0–10.5)
nRBC: 0 % (ref 0.0–0.2)

## 2023-07-29 LAB — CMP (CANCER CENTER ONLY)
ALT: 15 U/L (ref 0–44)
AST: 18 U/L (ref 15–41)
Albumin: 3.4 g/dL — ABNORMAL LOW (ref 3.5–5.0)
Alkaline Phosphatase: 91 U/L (ref 38–126)
Anion gap: 11 (ref 5–15)
BUN: 11 mg/dL (ref 6–20)
CO2: 21 mmol/L — ABNORMAL LOW (ref 22–32)
Calcium: 8.7 mg/dL — ABNORMAL LOW (ref 8.9–10.3)
Chloride: 103 mmol/L (ref 98–111)
Creatinine: 1.36 mg/dL — ABNORMAL HIGH (ref 0.61–1.24)
GFR, Estimated: >60 mL/min (ref >60)
Glucose, Bld: 122 mg/dL — ABNORMAL HIGH (ref 70–99)
Potassium: 3.7 mmol/L (ref 3.5–5.1)
Sodium: 135 mmol/L (ref 135–145)
Total Bilirubin: 0.9 mg/dL (ref 0.0–1.2)
Total Protein: 6.5 g/dL (ref 6.5–8.1)

## 2023-07-29 MED ORDER — DEXAMETHASONE 4 MG PO TABS
20.0000 mg | ORAL_TABLET | Freq: Once | ORAL | Status: AC
  Administered 2023-07-29: 20 mg via ORAL
  Filled 2023-07-29: qty 5

## 2023-07-29 MED ORDER — SODIUM CHLORIDE 0.9 % IV SOLN
Freq: Once | INTRAVENOUS | Status: AC
  Filled 2023-07-29: qty 250

## 2023-07-29 MED ORDER — DEXTROSE 5 % IV SOLN
20.0000 mg/m2 | Freq: Once | INTRAVENOUS | Status: AC
  Administered 2023-07-29: 36 mg via INTRAVENOUS
  Filled 2023-07-29: qty 15

## 2023-07-29 MED ORDER — HEPARIN SOD (PORK) LOCK FLUSH 100 UNIT/ML IV SOLN
500.0000 [IU] | Freq: Once | INTRAVENOUS | Status: AC | PRN
  Administered 2023-07-29: 500 [IU]
  Filled 2023-07-29: qty 5

## 2023-07-29 NOTE — Patient Instructions (Signed)
 [facility] - A DEPT OF [facility]  Discharge Instructions: Thank you for choosing [facility] to provide your oncology and hematology care.  If you have a lab appointment with the Cancer Center, please go directly to the Cancer Center and check in at the registration area.  Wear comfortable clothing and clothing appropriate for easy access to any Portacath or PICC line.   We strive to give you quality time with your provider. You may need to reschedule your appointment if you arrive late (15 or more minutes).  Arriving late affects you and other patients whose appointments are after yours.  Also, if you miss three or more appointments without notifying the office, you may be dismissed from the clinic at the provider's discretion.      For prescription refill requests, have your pharmacy contact our office and allow 72 hours for refills to be completed.    Today you received the following chemotherapy and/or immunotherapy agents: carfilzomib    To help prevent nausea and vomiting after your treatment, we encourage you to take your nausea medication as directed.  BELOW ARE SYMPTOMS THAT SHOULD BE REPORTED IMMEDIATELY: *FEVER GREATER THAN 100.4 F (38 C) OR HIGHER *CHILLS OR SWEATING *NAUSEA AND VOMITING THAT IS NOT CONTROLLED WITH YOUR NAUSEA MEDICATION *UNUSUAL SHORTNESS OF BREATH *UNUSUAL BRUISING OR BLEEDING *URINARY PROBLEMS (pain or burning when urinating, or frequent urination) *BOWEL PROBLEMS (unusual diarrhea, constipation, pain near the anus) TENDERNESS IN MOUTH AND THROAT WITH OR WITHOUT PRESENCE OF ULCERS (sore throat, sores in mouth, or a toothache) UNUSUAL RASH, SWELLING OR PAIN  UNUSUAL VAGINAL DISCHARGE OR ITCHING   Items with * indicate a potential emergency and should be followed up as soon as possible or go to the Emergency Department if any problems should occur.  Please show the CHEMOTHERAPY ALERT CARD or IMMUNOTHERAPY  ALERT CARD at check-in to the Emergency Department and triage nurse.  Should you have questions after your visit or need to cancel or reschedule your appointment, please contact [facility] - A DEPT OF [facility]  [phone] and follow the prompts.  Office hours are 8:00 a.m. to 4:30 p.m. Monday - Friday. Please note that voicemails left after 4:00 p.m. may not be returned until the following business day.  We are closed weekends and major holidays. You have access to a nurse at all times for urgent questions. Please call the main number to the clinic [phone] and follow the prompts.  For any non-urgent questions, you may also contact your provider using MyChart. We now offer e-Visits for anyone 87 and older to request care online for non-urgent symptoms. For details visit [email or web address].   Also download the MyChart app! Go to the app store, search "MyChart", open the app, select , and log in with your MyChart username and password.

## 2023-07-30 ENCOUNTER — Ambulatory Visit

## 2023-07-31 ENCOUNTER — Other Ambulatory Visit: Payer: Self-pay | Admitting: Nurse Practitioner

## 2023-07-31 ENCOUNTER — Other Ambulatory Visit: Payer: Self-pay

## 2023-07-31 ENCOUNTER — Telehealth: Payer: Self-pay

## 2023-07-31 ENCOUNTER — Inpatient Hospital Stay

## 2023-07-31 MED FILL — Oxycodone HCl Tab 15 MG: ORAL | 1 days supply | Qty: 3 | Fill #0 | Status: CN

## 2023-07-31 MED FILL — Oxycodone HCl Tab 15 MG: ORAL | 2 days supply | Qty: 5 | Fill #0 | Status: AC

## 2023-07-31 MED FILL — Oxycodone HCl Tab 15 MG: ORAL | 28 days supply | Qty: 85 | Fill #0 | Status: AC

## 2023-07-31 NOTE — Telephone Encounter (Signed)
 Patient called requesting his 15mg  Oxycodone pain medication to be refilled. I advised him to call the pharmacy so they could send [place] a refill request so that we may get medication filled for him.

## 2023-07-31 NOTE — Progress Notes (Signed)
 [facility] CSW Progress Note  Clinical Child psychotherapist contacted patient by phone to follow up and assess needs.  Patient is experiencing financial strain, but does not qualify for the [employer].  CSW made referral to Dallie Piles for [employer] gift card.  Will also make referral for gift card to the [employer] on behalf of patient.  He is currently receiving social security disability.    Dorothey Baseman, LCSW Clinical Social Worker [facility]

## 2023-08-05 NOTE — Patient Instructions (Addendum)
 Received email PA has been started for Methocarbamol .  Went into [employer] My Meds and submitted the PA.  Key: [id number].  Your information has been submitted to [employer]. [employer] will review the request and will issue a decision, typically within 1-3 days from your submission. You can check the updated outcome later by reopening this request.If [employer] has not responded in 1-3 days or if you have any questions about your ePA request, please contact [employer] at [phone]. If you think there may be a problem with your PA request, use our live chat feature at the bottom right.    08/07/23 at 11:11AM - PA denied for Methocarbamol  750mg  tablets.  Per denial letter "The requested drug is not on your plan's formulary (list of covered drugs).  Talk to your prescriber to see if the following covered alternative(s) would be right for you:  tizanidine  HCl capsule tizanidine  HCl tablet chlorzoxazone 500 mg tablet (requires prior authorization for members of age 53 or greater) (quantity limit  of 180 tablets every 30 days) baclofen  tablet".  Dr. Randy Buttery, would you like to try one of the suggested medications above or would you like to put through an appeal?  If moving forward with the appeal, what would you like to indicate for under specific, detailed clinical information/rationale of the patient's health status to address their denial reasons.  08/08/23 at 10:01AM - Per Dr. Randy Buttery " I dont want to continue any of these drugs at this time. I had informed the patient that medications like methocarbomol should not be used long term".  No further action required to approve PA for Methocarbamol  at this time.

## 2023-08-08 NOTE — Progress Notes (Signed)
 I dont want to continue any of these drugs at this time. I had informed the patient that medications like methocarbomol should not be used long term

## 2023-08-12 ENCOUNTER — Inpatient Hospital Stay

## 2023-08-12 ENCOUNTER — Other Ambulatory Visit: Payer: Self-pay

## 2023-08-12 ENCOUNTER — Ambulatory Visit: Admitting: Pulmonary Disease

## 2023-08-12 ENCOUNTER — Inpatient Hospital Stay (HOSPITAL_BASED_OUTPATIENT_CLINIC_OR_DEPARTMENT_OTHER): Admitting: Oncology

## 2023-08-12 ENCOUNTER — Encounter: Payer: Self-pay | Admitting: Oncology

## 2023-08-12 VITALS — BP 146/96 | HR 91 | Temp 97.6°F | Resp 18 | Ht 67.0 in | Wt 177.7 lb

## 2023-08-12 DIAGNOSIS — I10 Essential (primary) hypertension: Secondary | ICD-10-CM

## 2023-08-12 DIAGNOSIS — C9000 Multiple myeloma not having achieved remission: Secondary | ICD-10-CM | POA: Diagnosis not present

## 2023-08-12 DIAGNOSIS — C9002 Multiple myeloma in relapse: Secondary | ICD-10-CM

## 2023-08-12 DIAGNOSIS — T451X5A Adverse effect of antineoplastic and immunosuppressive drugs, initial encounter: Secondary | ICD-10-CM

## 2023-08-12 DIAGNOSIS — Z79899 Other long term (current) drug therapy: Secondary | ICD-10-CM | POA: Diagnosis not present

## 2023-08-12 DIAGNOSIS — G620 Drug-induced polyneuropathy: Secondary | ICD-10-CM | POA: Diagnosis not present

## 2023-08-12 DIAGNOSIS — Z5111 Encounter for antineoplastic chemotherapy: Secondary | ICD-10-CM

## 2023-08-12 DIAGNOSIS — Z5112 Encounter for antineoplastic immunotherapy: Secondary | ICD-10-CM | POA: Diagnosis not present

## 2023-08-12 LAB — CBC WITH DIFFERENTIAL (CANCER CENTER ONLY)
Abs Immature Granulocytes: 0.04 10*3/uL (ref 0.00–0.07)
Basophils Absolute: 0 10*3/uL (ref 0.0–0.1)
Basophils Relative: 1 %
Eosinophils Absolute: 0.5 10*3/uL (ref 0.0–0.5)
Eosinophils Relative: 10 %
HCT: 36.6 % — ABNORMAL LOW (ref 39.0–52.0)
Hemoglobin: 12.4 g/dL — ABNORMAL LOW (ref 13.0–17.0)
Immature Granulocytes: 1 %
Lymphocytes Relative: 23 %
Lymphs Abs: 1.1 10*3/uL (ref 0.7–4.0)
MCH: 31.4 pg (ref 26.0–34.0)
MCHC: 33.9 g/dL (ref 30.0–36.0)
MCV: 92.7 fL (ref 80.0–100.0)
Monocytes Absolute: 0.3 10*3/uL (ref 0.1–1.0)
Monocytes Relative: 6 %
Neutro Abs: 3 10*3/uL (ref 1.7–7.7)
Neutrophils Relative %: 59 %
Platelet Count: 208 10*3/uL (ref 150–400)
RBC: 3.95 MIL/uL — ABNORMAL LOW (ref 4.22–5.81)
RDW: 14.4 % (ref 11.5–15.5)
WBC Count: 5 10*3/uL (ref 4.0–10.5)
nRBC: 0 % (ref 0.0–0.2)

## 2023-08-12 LAB — CMP (CANCER CENTER ONLY)
ALT: 16 U/L (ref 0–44)
AST: 31 U/L (ref 15–41)
Albumin: 3.2 g/dL — ABNORMAL LOW (ref 3.5–5.0)
Alkaline Phosphatase: 79 U/L (ref 38–126)
Anion gap: 11 (ref 5–15)
BUN: 7 mg/dL (ref 6–20)
CO2: 21 mmol/L — ABNORMAL LOW (ref 22–32)
Calcium: 8.6 mg/dL — ABNORMAL LOW (ref 8.9–10.3)
Chloride: 103 mmol/L (ref 98–111)
Creatinine: 1.48 mg/dL — ABNORMAL HIGH (ref 0.61–1.24)
GFR, Estimated: 57 mL/min — ABNORMAL LOW (ref >60)
Glucose, Bld: 149 mg/dL — ABNORMAL HIGH (ref 70–99)
Potassium: 3.2 mmol/L — ABNORMAL LOW (ref 3.5–5.1)
Sodium: 135 mmol/L (ref 135–145)
Total Bilirubin: 0.6 mg/dL (ref 0.0–1.2)
Total Protein: 6.1 g/dL — ABNORMAL LOW (ref 6.5–8.1)

## 2023-08-12 MED ORDER — HEPARIN SOD (PORK) LOCK FLUSH 100 UNIT/ML IV SOLN
500.0000 [IU] | Freq: Once | INTRAVENOUS | Status: AC | PRN
  Administered 2023-08-12: 500 [IU]
  Filled 2023-08-12: qty 5

## 2023-08-12 MED ORDER — DEXAMETHASONE 4 MG PO TABS
20.0000 mg | ORAL_TABLET | Freq: Once | ORAL | Status: AC
  Administered 2023-08-12: 20 mg via ORAL
  Filled 2023-08-12: qty 5

## 2023-08-12 MED ORDER — FUROSEMIDE 20 MG PO TABS
20.0000 mg | ORAL_TABLET | Freq: Every day | ORAL | 0 refills | Status: AC
  Filled 2023-08-12: qty 30, 30d supply, fill #0

## 2023-08-12 MED ORDER — DEXTROSE 5 % IV SOLN
20.0000 mg/m2 | Freq: Once | INTRAVENOUS | Status: AC
  Administered 2023-08-12: 36 mg via INTRAVENOUS
  Filled 2023-08-12: qty 15

## 2023-08-12 MED ORDER — SODIUM CHLORIDE 0.9 % IV SOLN
Freq: Once | INTRAVENOUS | Status: AC
  Filled 2023-08-12: qty 250

## 2023-08-12 NOTE — Progress Notes (Signed)
 [facility] CSW Progress Note  Visual merchandiser met with patient to follow up regarding financial strain.  His daughter, [name], was also present.  Answered questions regarding financial resources.  He is currently receiving social security disability.  He said he eats out with his daughter frequently and has gained weight.  No other needs at this time.    James Peal, LCSW Clinical Social Worker [facility]

## 2023-08-12 NOTE — Progress Notes (Signed)
 Hematology/Oncology Consult note [facility]  [phone] [phone]  Patient Care Team: Gabriel Apollos, NP as PCP - General (Internal Medicine) Brad Cables, MD as PCP - Hematology/Oncology Constancia Delton, MD as PCP - Cardiology (Cardiology) Avonne Boettcher, MD as Consulting Physician (Hematology and Oncology)   Name of the patient: James House  [id number]  [birth year]   Date of visit: 08/12/23  Diagnosis-  recurrent kappa light chain multiple myeloma in relapse     Chief complaint/ Reason for visit-on treatment assessment prior to cycle 14-day 1 of Kyprolis   Heme/Onc history: Patient is a 53- year-old male with a history of kappa light chain myeloma that was treated by Dr. Mirna Amis.  History is as follows:   1.  Patient presented with renal insufficiency with a creatinine of 2.4 in April 2018.  Kidney biopsy showed myeloma cast nephropathy.  Kappa light chain was 3004 and lambda 0.22 with a kappa lambda ratio of 13,000 655.  Skeletal survey showed multiple calvarial and marrow lesions.  Bone marrow biopsy in May 2018 showed 43% plasma cells.  He received CyBorD for cycle 1 in May 2018 and was subsequently switched to RVD.  He received 4 cycles followed by a repeat bone marrow biopsy in August 2018 which showed no increase in plasma cells.   2.  He underwent autologous stem cell transplantation on 01/30/2017.  He was started on Revlimid  maintenance 10 mg daily in January 2019.   3.  Labs from January February and March 2019 done at [facility] showed no M spike, normal kappa lambda ratio of 1.06.  He was last seen by them in April 2019.   4. Following that patient was admitted to [facility] for healthcare associated pneumonia and was recently discharged.  He wished to transfer his care to us  at this time.  He was on maintenance Revlimid  5 mg which was then reduced to 2.5 mg.  Follows up with pain clinic for chemo-induced peripheral neuropathy  with Dr. Barth Borne.  His urine tested positive for [facility] and therefore patient was discharged from pain clinic   5.  Noted to have gradually increasing free kappa light chainFrom 15 in 20 21-39.5 with a ratio of 3.09.  He was seen by [facility] again and underwent a repeat bone marrow biopsy which did not show any evidence of clonal plasma cells.  PET CT scan also did not show any new active lytic lesions in May 2023..  Revlimid  dose was increased to 15 mg 3 weeks on 1 week off but kappa light chains show continuing to increase   6.  PET CT scan in December 2023 did not show any evidence of active myeloma.  Patient underwent repeat bone marrow biopsy at [facility] which showed 6% plasma cellsConsistent with persistent plasma cell neoplasm.  74 to 86% of isolated plasma cells show loss of T p53 and monosomy 13.  Patient was seen by [name] at [facility] and was recommended Darzalex  Pomalyst  dexamethasone  regimen   7.  Disease progression after 8 weekly cycles of Darazalex kappa light chain increased from 332-646 with a ratio that went up from 39-57 concerning for disease progression.  Patient switched to carfilzomib  Pomalyst  dexamethasone  regimen    Interval history-reports bilateral hand swelling and occasional ankle swelling over the last week or so.  Feels that his neuropathy symptoms are getting worse.  ECOG PS- 2 Pain scale- 0   Review of systems- Review of Systems  Constitutional:  Positive for malaise/fatigue.  Negative for chills, fever and weight loss.  HENT:  Negative for congestion, ear discharge and nosebleeds.   Eyes:  Negative for blurred vision.  Respiratory:  Negative for cough, hemoptysis, sputum production, shortness of breath and wheezing.   Cardiovascular:  Negative for chest pain, palpitations, orthopnea and claudication.  Gastrointestinal:  Negative for abdominal pain, blood in stool, constipation, diarrhea, heartburn, melena, nausea and vomiting.  Genitourinary:  Negative for dysuria, flank  pain, frequency, hematuria and urgency.  Musculoskeletal:  Negative for back pain, joint pain and myalgias.  Skin:  Negative for rash.  Neurological:  Positive for sensory change (Peripheral neuropathy). Negative for dizziness, tingling, focal weakness, seizures, weakness and headaches.  Endo/Heme/Allergies:  Does not bruise/bleed easily.  Psychiatric/Behavioral:  Negative for depression and suicidal ideas. The patient does not have insomnia.       Allergies  Allergen Reactions   Morphine  Nausea And Vomiting     Past Medical History:  Diagnosis Date   CKD (chronic kidney disease) stage 3, GFR 30-59 ml/min (HCC)    Elevated sedimentation rate 12/24/2017   Hypertension    Marijuana use 03/28/2021   ABNORMAL (12/04/2020) UDS (+) Carboxy-THC (Marijuana)     Multiple myeloma (HCC) 08/11/2017   Neuropathy    Pneumonia    Severe sepsis (HCC) 08/11/2017   Shingles 08/03/2019     Past Surgical History:  Procedure Laterality Date   ABDOMINAL SURGERY     COLONOSCOPY WITH PROPOFOL  N/A 05/25/2021   Procedure: COLONOSCOPY WITH PROPOFOL ;  Surgeon: Selena Daily, MD;  Location: [facility] ENDOSCOPY;  Service: Gastroenterology;  Laterality: N/A;   IR IMAGING GUIDED PORT INSERTION  08/02/2022   JOINT REPLACEMENT     right knee   KNEE SURGERY Left     Social History   Socioeconomic History   Marital status: Single    Spouse name: Shelvy Dickens   Number of children: 3   Years of education: Not on file   Highest education level: Not on file  Occupational History   Not on file  Tobacco Use   Smoking status: Former   Smokeless tobacco: Current    Types: Snuff  Vaping Use   Vaping status: Never Used  Substance and Sexual Activity   Alcohol use: No   Drug use: No   Sexual activity: Yes  Other Topics Concern   Not on file  Social History Narrative   Live in private residence with spouse and mother in law   Social Drivers of Health   Financial Resource Strain: Low Risk  (08/12/2017)    Overall Financial Resource Strain (CARDIA)    Difficulty of Paying Living Expenses: Not hard at all  Food Insecurity: No Food Insecurity (08/12/2017)   Hunger Vital Sign    Worried About Running Out of Food in the Last Year: Never true    Ran Out of Food in the Last Year: Never true  Transportation Needs: No Transportation Needs (08/12/2017)   PRAPARE - Administrator, Civil Service (Medical): No    Lack of Transportation (Non-Medical): No  Physical Activity: Sufficiently Active (08/12/2017)   Exercise Vital Sign    Days of Exercise per Week: 7 days    Minutes of Exercise per Session: 30 min  Stress: No Stress Concern Present (08/12/2017)   [employer] of Occupational Health - Occupational Stress Questionnaire    Feeling of Stress : Only a little  Social Connections: Somewhat Isolated (08/12/2017)   Social Connection and Isolation Panel [NHANES]    Frequency  of Communication with Friends and Family: Once a week    Frequency of Social Gatherings with Friends and Family: Once a week    Attends Religious Services: More than 4 times per year    Active Member of [employer] or Organizations: No    Attends Banker Meetings: Never    Marital Status: Married  Catering manager Violence: Not At Risk (08/12/2017)   Humiliation, Afraid, Rape, and Kick questionnaire    Fear of Current or Ex-Partner: No    Emotionally Abused: No    Physically Abused: No    Sexually Abused: No    Family History  Problem Relation Age of Onset   Heart disease Mother    Cancer Mother 37       Pancreatic   COPD Mother    Diabetes Mother    Hyperlipidemia Mother    Hypertension Mother    Cancer Maternal Uncle        pancreatic   Heart disease Maternal Grandmother    Cancer Maternal Grandmother 43       colon   COPD Maternal Grandmother    Diabetes Maternal Grandmother    Hypertension Maternal Grandmother      Current Outpatient Medications:    furosemide  (LASIX ) 20 MG tablet, Take  1 tablet (20 mg total) by mouth daily. Per Dr. Randy Buttery take for 4 days then stop., Disp: 30 tablet, Rfl: 0   acyclovir  (ZOVIRAX ) 400 MG tablet, Take 1 tablet (400 mg total) by mouth 2 (two) times daily., Disp: 60 tablet, Rfl: 2   albuterol  (VENTOLIN  HFA) 108 (90 Base) MCG/ACT inhaler, Inhale 2 puffs into the lungs every 6 (six) hours as needed for wheezing or shortness of breath., Disp: 6.7 g, Rfl: 6   amitriptyline  (ELAVIL ) 25 MG tablet, Take 1 tablet (25 mg total) by mouth at bedtime. For headache prevention and sleep, Disp: 90 tablet, Rfl: 1   amLODipine -benazepril  (LOTREL ) 10-40 MG capsule, Take 1 capsule by mouth daily. for blood pressure., Disp: 90 capsule, Rfl: 1   aspirin  EC 81 MG tablet, Take 1 tablet (81 mg total) by mouth daily., Disp: 30 tablet, Rfl: 2   baclofen  (LIORESAL ) 10 MG tablet, Take 1 tablet (10 mg total) by mouth 4 (four) times daily as needed for muscle spasms., Disp: 360 tablet, Rfl: 1   carvedilol  (COREG ) 25 MG tablet, Take 1 tablet (25 mg total) by mouth 2 (two) times daily with a meal., Disp: 60 tablet, Rfl: 3   celecoxib  (CELEBREX ) 200 MG capsule, Take 1 capsule (200 mg total) by mouth daily., Disp: 30 capsule, Rfl: 2   cyanocobalamin  (VITAMIN B12) 1000 MCG/ML injection, Inject 1 mL (1,000 mcg total) into the muscle every 30 (thirty) days., Disp: 1 mL, Rfl: 11   DULoxetine  (CYMBALTA ) 60 MG capsule, Take 1 capsule (60 mg total) by mouth daily., Disp: 60 capsule, Rfl: 1   hydrochlorothiazide  (HYDRODIURIL ) 25 MG tablet, Take 1 tablet (25 mg total) by mouth daily., Disp: 90 tablet, Rfl: 3   lidocaine -prilocaine  (EMLA ) cream, Apply 1 Application topically as needed., Disp: 30 g, Rfl: 0   methocarbamol  (ROBAXIN -750) 750 MG tablet, Take 1 tablet (750 mg total) by mouth 3 (three) times daily., Disp: 90 tablet, Rfl: 0   OLANZapine  (ZYPREXA ) 10 MG tablet, Take 1 tablet (10 mg total) by mouth at bedtime. To help with nausea prevention., Disp: 30 tablet, Rfl: 1   oxyCODONE  (ROXICODONE ) 15  MG immediate release tablet, Take 1 tablet (15 mg total) by mouth every 8 (  eight) hours as needed for pain., Disp: 90 tablet, Rfl: 0   pantoprazole  (PROTONIX ) 40 MG tablet, Take 1 tablet (40 mg total) by mouth daily., Disp: 30 tablet, Rfl: 0   pomalidomide  (POMALYST ) 2 MG capsule, Take 1 capsule (2 mg total) by mouth daily. Take for 21 days, then hold for 7 days. Repeat every 28 days., Disp: 21 capsule, Rfl: 0   Potassium Chloride  ER 20 MEQ TBCR, Take 1 tablet by mouth 2 (two) times daily., Disp: , Rfl:    potassium chloride  SA (KLOR-CON  M) 20 MEQ tablet, Take 2 tablets (40 mEq total) by mouth daily., Disp: 60 tablet, Rfl: 2   pregabalin  (LYRICA ) 300 MG capsule, TAKE ONE CAPSULE (300MG  TOTAL) BY MOUTH TWICE DAILY, Disp: 180 capsule, Rfl: 0   prochlorperazine  (COMPAZINE ) 10 MG tablet, Take by mouth every 8 (eight) hours as needed., Disp: , Rfl:   Physical exam:  Vitals:   08/12/23 0922 08/12/23 0925  BP: (!) 141/111 (!) 146/96  Pulse: 91   Resp: 18   Temp: 97.6 F (36.4 C)   TempSrc: Tympanic   SpO2: 100%   Weight: 177 lb 11.2 oz (80.6 kg)   Height: 5\' 7"  (1.702 m)    Physical Exam Cardiovascular:     Rate and Rhythm: Normal rate and regular rhythm.     Heart sounds: Normal heart sounds.  Pulmonary:     Effort: Pulmonary effort is normal.     Breath sounds: Normal breath sounds.  Musculoskeletal:     Comments: Trace bilateral ankle edema  Skin:    General: Skin is warm and dry.  Neurological:     Mental Status: He is alert and oriented to person, place, and time.      I have personally reviewed labs listed below:    Latest Ref Rng & Units 08/12/2023    8:57 AM  CMP  Glucose 70 - 99 mg/dL 956   BUN 6 - 20 mg/dL 7   Creatinine 2.13 - 0.86 mg/dL 5.78   Sodium 469 - 629 mmol/L 135   Potassium 3.5 - 5.1 mmol/L 3.2   Chloride 98 - 111 mmol/L 103   CO2 22 - 32 mmol/L 21   Calcium 8.9 - 10.3 mg/dL 8.6   Total Protein 6.5 - 8.1 g/dL 6.1   Total Bilirubin 0.0 - 1.2 mg/dL 0.6    Alkaline Phos 38 - 126 U/L 79   AST 15 - 41 U/L 31   ALT 0 - 44 U/L 16       Latest Ref Rng & Units 08/12/2023    8:57 AM  CBC  WBC 4.0 - 10.5 K/uL 5.0   Hemoglobin 13.0 - 17.0 g/dL 52.8   Hematocrit 41.3 - 52.0 % 36.6   Platelets 150 - 400 K/uL 208      Assessment and plan- Patient is a 53 y.o. male with history of kappa light chain multiple myeloma here for on treatment assessment prior to cycle 14-day 1 of carfilzomib   Counts okay to proceed with cycle 14-day 1 of carfilzomib  today.  Will also start Pomalyst  today 2 mg 3 weeks on and 1 week off.  I will see him back in 2 weeks for cycle 14-day 15.  We are checking myeloma panel and serum free light chains next week  CKD remains overall stable.  Continue to monitor  Patient will be getting repeat echocardiogram in 10 days time.  I have given him a prescription for as needed Lasix  which he can  take if he notices increase in leg swelling.  Echocardiogram has been normal so far.  Chemo-induced peripheral neuropathy: He is on Lyrica  and Cymbalta  maximum doses which she will continue.  Also continue as needed oxycodone  and Xtampza    Visit Diagnosis 1. Encounter for antineoplastic chemotherapy   2. Multiple myeloma not having achieved remission (HCC)   3. Hypertension, unspecified type   4. High risk medication use      Dr. Seretha Dance, MD, MPH [facility] [phone] 08/12/2023 12:16 PM

## 2023-08-13 DIAGNOSIS — G4733 Obstructive sleep apnea (adult) (pediatric): Secondary | ICD-10-CM | POA: Diagnosis not present

## 2023-08-18 ENCOUNTER — Encounter: Admitting: Pulmonary Disease

## 2023-08-18 ENCOUNTER — Ambulatory Visit: Admitting: Pulmonary Disease

## 2023-08-19 ENCOUNTER — Other Ambulatory Visit: Payer: Self-pay | Admitting: Oncology

## 2023-08-19 ENCOUNTER — Inpatient Hospital Stay

## 2023-08-19 VITALS — BP 153/99 | HR 90 | Temp 97.1°F | Resp 17 | Wt 179.7 lb

## 2023-08-19 DIAGNOSIS — Z5112 Encounter for antineoplastic immunotherapy: Secondary | ICD-10-CM | POA: Diagnosis not present

## 2023-08-19 DIAGNOSIS — C9002 Multiple myeloma in relapse: Secondary | ICD-10-CM

## 2023-08-19 DIAGNOSIS — C9000 Multiple myeloma not having achieved remission: Secondary | ICD-10-CM

## 2023-08-19 LAB — CBC WITH DIFFERENTIAL (CANCER CENTER ONLY)
Abs Immature Granulocytes: 0.05 10*3/uL (ref 0.00–0.07)
Basophils Absolute: 0 10*3/uL (ref 0.0–0.1)
Basophils Relative: 0 %
Eosinophils Absolute: 0.5 10*3/uL (ref 0.0–0.5)
Eosinophils Relative: 8 %
HCT: 39 % (ref 39.0–52.0)
Hemoglobin: 13.2 g/dL (ref 13.0–17.0)
Immature Granulocytes: 1 %
Lymphocytes Relative: 17 %
Lymphs Abs: 1.1 10*3/uL (ref 0.7–4.0)
MCH: 31.8 pg (ref 26.0–34.0)
MCHC: 33.8 g/dL (ref 30.0–36.0)
MCV: 94 fL (ref 80.0–100.0)
Monocytes Absolute: 0.4 10*3/uL (ref 0.1–1.0)
Monocytes Relative: 6 %
Neutro Abs: 4.5 10*3/uL (ref 1.7–7.7)
Neutrophils Relative %: 68 %
Platelet Count: 217 10*3/uL (ref 150–400)
RBC: 4.15 MIL/uL — ABNORMAL LOW (ref 4.22–5.81)
RDW: 15.1 % (ref 11.5–15.5)
WBC Count: 6.6 10*3/uL (ref 4.0–10.5)
nRBC: 0 % (ref 0.0–0.2)

## 2023-08-19 LAB — CMP (CANCER CENTER ONLY)
ALT: 25 U/L (ref 0–44)
AST: 25 U/L (ref 15–41)
Albumin: 3.4 g/dL — ABNORMAL LOW (ref 3.5–5.0)
Alkaline Phosphatase: 84 U/L (ref 38–126)
Anion gap: 8 (ref 5–15)
BUN: 12 mg/dL (ref 6–20)
CO2: 23 mmol/L (ref 22–32)
Calcium: 8.4 mg/dL — ABNORMAL LOW (ref 8.9–10.3)
Chloride: 103 mmol/L (ref 98–111)
Creatinine: 1.47 mg/dL — ABNORMAL HIGH (ref 0.61–1.24)
GFR, Estimated: 57 mL/min — ABNORMAL LOW (ref >60)
Glucose, Bld: 147 mg/dL — ABNORMAL HIGH (ref 70–99)
Potassium: 3.8 mmol/L (ref 3.5–5.1)
Sodium: 134 mmol/L — ABNORMAL LOW (ref 135–145)
Total Bilirubin: 0.7 mg/dL (ref 0.0–1.2)
Total Protein: 6 g/dL — ABNORMAL LOW (ref 6.5–8.1)

## 2023-08-19 MED ORDER — DEXAMETHASONE 4 MG PO TABS
20.0000 mg | ORAL_TABLET | Freq: Once | ORAL | Status: AC
  Administered 2023-08-19: 20 mg via ORAL
  Filled 2023-08-19: qty 5

## 2023-08-19 MED ORDER — DEXTROSE 5 % IV SOLN
20.0000 mg/m2 | Freq: Once | INTRAVENOUS | Status: AC
  Administered 2023-08-19: 36 mg via INTRAVENOUS
  Filled 2023-08-19: qty 13

## 2023-08-19 MED ORDER — HEPARIN SOD (PORK) LOCK FLUSH 100 UNIT/ML IV SOLN
500.0000 [IU] | Freq: Once | INTRAVENOUS | Status: AC | PRN
  Administered 2023-08-19: 500 [IU]
  Filled 2023-08-19: qty 5

## 2023-08-19 MED ORDER — SODIUM CHLORIDE 0.9 % IV SOLN
Freq: Once | INTRAVENOUS | Status: AC
  Filled 2023-08-19: qty 250

## 2023-08-20 LAB — KAPPA/LAMBDA LIGHT CHAINS
Kappa free light chain: 57.3 mg/L — ABNORMAL HIGH (ref 3.3–19.4)
Kappa, lambda light chain ratio: 7.96 — ABNORMAL HIGH (ref 0.26–1.65)
Lambda free light chains: 7.2 mg/L (ref 5.7–26.3)

## 2023-08-22 ENCOUNTER — Encounter: Payer: Self-pay | Admitting: Nurse Practitioner

## 2023-08-22 ENCOUNTER — Ambulatory Visit: Admitting: Nurse Practitioner

## 2023-08-22 VITALS — BP 124/90 | HR 81 | Temp 97.7°F | Ht 67.0 in | Wt 177.6 lb

## 2023-08-22 DIAGNOSIS — G4733 Obstructive sleep apnea (adult) (pediatric): Secondary | ICD-10-CM | POA: Diagnosis not present

## 2023-08-22 LAB — MULTIPLE MYELOMA PANEL, SERUM
Albumin SerPl Elph-Mcnc: 3.3 g/dL (ref 2.9–4.4)
Albumin/Glob SerPl: 1.5 (ref 0.7–1.7)
Alpha 1: 0.2 g/dL (ref 0.0–0.4)
Alpha2 Glob SerPl Elph-Mcnc: 0.7 g/dL (ref 0.4–1.0)
B-Globulin SerPl Elph-Mcnc: 0.9 g/dL (ref 0.7–1.3)
Gamma Glob SerPl Elph-Mcnc: 0.5 g/dL (ref 0.4–1.8)
Globulin, Total: 2.3 g/dL (ref 2.2–3.9)
IgA: 90 mg/dL (ref 90–386)
IgG (Immunoglobin G), Serum: 642 mg/dL (ref 603–1613)
IgM (Immunoglobulin M), Srm: 48 mg/dL (ref 20–172)
Total Protein ELP: 5.6 g/dL — ABNORMAL LOW (ref 6.0–8.5)

## 2023-08-22 NOTE — Progress Notes (Signed)
 @Patient  ID: James Collie., male    DOB: [birth year], 53 y.o.   MRN: [id number]  Chief Complaint  Patient presents with   Follow-up    Is having trouble with mask.     Referring provider: Gabriel Ozell, NP  HPI: 53 year old male, former smoker followed for complex very severe sleep apnea. He was last seen in office by French Jester, NP. Past medical history significant for HTN, migraines, hx perforated gastric ulcer, multiple myeloma followed by oncology s/p stem cell transplant 2018 with recurrence in 03/2022, chronic back pain and neuropathy on chronic narcotics and muscle relaxers.  TEST/EVENTS:  03/16/2023 HST: AHI 100.4/h, SpO2 low 84%. 94% central events  04/29/2023 CPAP titration >> optimal CPAP 8 cmH2O  05/29/2023: OV with Parrett, NP. Seen in November for a sleep consult. Has snoring and daytime sleepiness. HST - very severe sleep apnea with central predominant and nocturnal hypoxia. He had CPAP titration 04/29/2023 that showed optimal control on CPAP 8 cmH2O. He was started on CPAP; got it about 2 weeks ago. Having difficulties tolerating. Feels he is smothering at times. DME gave him a different FFM than what was ordered. Mask is very uncomfortable. Download showed excellent compliance with average 6.5 hours; however, still having residual AHI 23/h with CAI 22.4/h. did recently restart amitriptyline . Adjusted to auto 8-16 cmH2O and ordered ResMed F30i wide mask.  08/22/2023: Today - follow up Discussed the use of AI scribe software for clinical note transcription with the patient, who gave verbal consent to proceed.  History of Present Illness   James Obenour. is a 53 year old male with sleep apnea who presents for follow-up on CPAP therapy. His CPAP pressures were adjusted again after his last visit to a set pressure of 10 cmH2O.  He has been using his CPAP machine inconsistently over the past month, with improved usage over the last two nights. He's still having issues with the  mask and comfort of this. Does feel like pressures are better. AHI is down to 6-7/h.   He is dissatisfied with his current mask, which covers both the nose and mouth, and wants to try a nasal mask.   While using the CPAP, he does not snore, but he does snore when not using it. He has noticed some improvement in the restfulness of his sleep with CPAP use but has not experienced a significant change in daytime energy levels yet.  He acknowledges that he has not been using the CPAP every night, which may be affecting his energy levels.     07/23/2023-08/21/2023: CPAP 10 cmH2O 5/30 days; 10% >4 hr; average use 3 hr 58 min Leaks 95th 32.4 AHI 6.9, CAI 6.2   Allergies  Allergen Reactions   Morphine  Nausea And Vomiting    Immunization History  Administered Date(s) Administered   Influenza,inj,Quad PF,6+ Mos 01/19/2016, 02/13/2018   Pneumococcal Conjugate-13 02/13/2018, 04/16/2018, 06/18/2018   Tdap 03/01/2016, 02/13/2018, 04/16/2018, 06/18/2018, 04/09/2020   Zoster Recombinant(Shingrix ) 11/21/2021    Past Medical History:  Diagnosis Date   CKD (chronic kidney disease) stage 3, GFR 30-59 ml/min (HCC)    Elevated sedimentation rate 12/24/2017   Hypertension    Marijuana use 03/28/2021   ABNORMAL (12/04/2020) UDS (+) Carboxy-THC (Marijuana)     Multiple myeloma (HCC) 08/11/2017   Neuropathy    Pneumonia    Severe sepsis (HCC) 08/11/2017   Shingles 08/03/2019    Tobacco History: Social History   Tobacco Use  Smoking Status Former  Smokeless Tobacco Current   Types: Snuff   Ready to quit: Not Answered Counseling given: Not Answered   Outpatient Medications Prior to Visit  Medication Sig Dispense Refill   acyclovir  (ZOVIRAX ) 400 MG tablet Take 1 tablet (400 mg total) by mouth 2 (two) times daily. 60 tablet 2   albuterol  (VENTOLIN  HFA) 108 (90 Base) MCG/ACT inhaler Inhale 2 puffs into the lungs every 6 (six) hours as needed for wheezing or shortness of breath. 6.7 g 6    amitriptyline  (ELAVIL ) 25 MG tablet Take 1 tablet (25 mg total) by mouth at bedtime. For headache prevention and sleep 90 tablet 1   amLODipine -benazepril  (LOTREL ) 10-40 MG capsule Take 1 capsule by mouth daily. for blood pressure. 90 capsule 1   aspirin  EC 81 MG tablet Take 1 tablet (81 mg total) by mouth daily. 30 tablet 2   baclofen  (LIORESAL ) 10 MG tablet Take 1 tablet (10 mg total) by mouth 4 (four) times daily as needed for muscle spasms. 360 tablet 1   carvedilol  (COREG ) 25 MG tablet Take 1 tablet (25 mg total) by mouth 2 (two) times daily with a meal. 60 tablet 3   celecoxib  (CELEBREX ) 200 MG capsule Take 1 capsule (200 mg total) by mouth daily. 30 capsule 2   cyanocobalamin  (VITAMIN B12) 1000 MCG/ML injection Inject 1 mL (1,000 mcg total) into the muscle every 30 (thirty) days. 1 mL 11   DULoxetine  (CYMBALTA ) 60 MG capsule Take 1 capsule (60 mg total) by mouth daily. 60 capsule 1   furosemide  (LASIX ) 20 MG tablet Take 1 tablet (20 mg total) by mouth daily. Per Dr. Randy Buttery take for 4 days then stop. 30 tablet 0   hydrochlorothiazide  (HYDRODIURIL ) 25 MG tablet Take 1 tablet (25 mg total) by mouth daily. 90 tablet 3   lidocaine -prilocaine  (EMLA ) cream Apply 1 Application topically as needed. 30 g 0   methocarbamol  (ROBAXIN -750) 750 MG tablet Take 1 tablet (750 mg total) by mouth 3 (three) times daily. 90 tablet 0   OLANZapine  (ZYPREXA ) 10 MG tablet Take 1 tablet (10 mg total) by mouth at bedtime. To help with nausea prevention. 30 tablet 1   oxyCODONE  (ROXICODONE ) 15 MG immediate release tablet Take 1 tablet (15 mg total) by mouth every 8 (eight) hours as needed for pain. 90 tablet 0   pantoprazole  (PROTONIX ) 40 MG tablet Take 1 tablet (40 mg total) by mouth daily. 30 tablet 0   POMALYST  2 MG capsule TAKE 1 CAPSULE BY MOUTH 1 TIME A DAY FOR 21 DAYS ON THEN 7 DAYS OFF 21 capsule 0   Potassium Chloride  ER 20 MEQ TBCR Take 1 tablet by mouth 2 (two) times daily.     potassium chloride  SA (KLOR-CON  M)  20 MEQ tablet Take 2 tablets (40 mEq total) by mouth daily. 60 tablet 2   pregabalin  (LYRICA ) 300 MG capsule TAKE ONE CAPSULE (300MG  TOTAL) BY MOUTH TWICE DAILY 180 capsule 0   prochlorperazine  (COMPAZINE ) 10 MG tablet Take by mouth every 8 (eight) hours as needed.     No facility-administered medications prior to visit.     Review of Systems:   Constitutional: No weight loss or gain, night sweats, fevers, chills,  or lassitude. +fatigue  HEENT: No headaches, difficulty swallowing, tooth/dental problems, or sore throat. No sneezing, itching, ear ache, nasal congestion, or post nasal drip CV:  No chest pain, orthopnea, PND, swelling in lower extremities, Resp: +snoring. No shortness of breath with exertion or at rest. No cough GI:  No heartburn, indigestion GU: No nocturia  MSK:  +chronic pain  Neuro: No dizziness or lightheadedness.  Psych: No depression or anxiety. Mood stable. +sleep disturbance     Physical Exam:  BP (!) 124/90 (BP Location: Left Arm, Patient Position: Sitting, Cuff Size: Normal)   Pulse 81   Temp 97.7 F (36.5 C) (Temporal)   Ht 5\' 7"  (1.702 m)   Wt 177 lb 9.6 oz (80.6 kg)   SpO2 97%   BMI 27.82 kg/m   GEN: Pleasant, interactive, well-kempt; in no acute distress HEENT:  Normocephalic and atraumatic. PERRLA. Sclera white. Nasal turbinates pink, moist and patent bilaterally. No rhinorrhea present. Oropharynx pink and moist, without exudate or edema. No lesions, ulcerations, or postnasal drip. Mallampati II NECK:  Supple w/ fair ROM. No lymphadenopathy.   CV: RRR, no m/r/g, no peripheral edema. Pulses intact, +2 bilaterally. No cyanosis, pallor or clubbing. PULMONARY:  Unlabored, regular breathing. Clear bilaterally A&P w/o wheezes/rales/rhonchi. No accessory muscle use.  GI: BS present and normoactive. Soft, non-tender to palpation. No organomegaly or masses detected.  MSK: No erythema, warmth or tenderness. Cap refil <2 sec all extrem.  Neuro: A/Ox3. No  focal deficits noted.   Skin: Warm, no lesions or rashe Psych: Normal affect and behavior. Judgement and thought content appropriate.     Lab Results:  CBC    Component Value Date/Time   WBC 6.6 08/19/2023 0818   WBC 12.6 (H) 09/19/2022 0847   RBC 4.15 (L) 08/19/2023 0818   HGB 13.2 08/19/2023 0818   HCT 39.0 08/19/2023 0818   PLT 217 08/19/2023 0818   MCV 94.0 08/19/2023 0818   MCH 31.8 08/19/2023 0818   MCHC 33.8 08/19/2023 0818   RDW 15.1 08/19/2023 0818   LYMPHSABS 1.1 08/19/2023 0818   MONOABS 0.4 08/19/2023 0818   EOSABS 0.5 08/19/2023 0818   BASOSABS 0.0 08/19/2023 0818    BMET    Component Value Date/Time   NA 134 (L) 08/19/2023 0818   NA 140 11/26/2017 1307   K 3.8 08/19/2023 0818   CL 103 08/19/2023 0818   CO2 23 08/19/2023 0818   GLUCOSE 147 (H) 08/19/2023 0818   BUN 12 08/19/2023 0818   BUN 11 11/26/2017 1307   CREATININE 1.47 (H) 08/19/2023 0818   CALCIUM 8.4 (L) 08/19/2023 0818   GFRNONAA 57 (L) 08/19/2023 0818   GFRAA >60 12/31/2019 1401    BNP    Component Value Date/Time   BNP 27.9 01/21/2023 0826     Imaging:  No results found.  carfilzomib  (KYPROLIS ) 36 mg in dextrose  5 % 50 mL chemo infusion     Date Action Dose Route User   06/24/2023 1058 Rate/Dose Change (none) Intravenous Elayne Greaser, RN   06/24/2023 1058 Rate/Dose Change (none) Intravenous Elayne Greaser, RN   06/24/2023 1052 Infusion Verify (none) Intravenous Elayne Greaser, RN   06/24/2023 1021 Infusion Verify (none) Intravenous Elayne Greaser, RN   06/24/2023 1021 New Bag/Given 36 mg Intravenous Elayne Greaser, RN      carfilzomib  (KYPROLIS ) 36 mg in dextrose  5 % 50 mL chemo infusion     Date Action Dose Route User   07/01/2023 1027 Rate/Dose Change (none) Intravenous Chilcott, Loren O, RN   07/01/2023 1026 New Bag/Given 36 mg Intravenous Chilcott, Loren O, RN      carfilzomib  (KYPROLIS ) 36 mg in dextrose  5 % 50 mL chemo infusion     Date Action Dose Route User   07/15/2023  1014 Infusion Verify (none) Intravenous Marlane Silver, [place]  Marvina Slough, RN   07/15/2023 1014 New Bag/Given 36 mg Intravenous Saunders, Doni H, RN      carfilzomib  (KYPROLIS ) 36 mg in dextrose  5 % 50 mL chemo infusion     Date Action Dose Route User   07/22/2023 1037 Infusion Verify (none) Intravenous Meryle Acre, RN   07/22/2023 1037 Rate/Dose Change (none) Intravenous Meryle Acre, RN   07/22/2023 1037 New Bag/Given 36 mg Intravenous Meryle Acre, RN      carfilzomib  (KYPROLIS ) 36 mg in dextrose  5 % 50 mL chemo infusion     Date Action Dose Route User   07/29/2023 1129 Rate/Dose Change (none) Intravenous Slade, Oneeka S, RN   07/29/2023 1129 Rate/Dose Change (none) Intravenous Slade, Oneeka S, RN   07/29/2023 1101 Infusion Verify (none) Intravenous Slade, Oneeka S, RN   07/29/2023 1052 Infusion Verify (none) Intravenous Slade, Oneeka S, RN   07/29/2023 1052 New Bag/Given 36 mg Intravenous Slade, Oneeka S, RN      carfilzomib  (KYPROLIS ) 36 mg in dextrose  5 % 50 mL chemo infusion     Date Action Dose Route User   08/12/2023 1117 Rate/Dose Change (none) Intravenous Trayer, Carmel L, RN   08/12/2023 1037 Infusion Verify (none) Intravenous Anthony Bateman, RN   08/12/2023 1037 New Bag/Given 36 mg Intravenous Trayer, Carmel L, RN      carfilzomib  (KYPROLIS ) 36 mg in dextrose  5 % 50 mL chemo infusion     Date Action Dose Route User   08/19/2023 1029 Rate/Dose Change (none) Intravenous Slade, Oneeka S, RN   08/19/2023 2440 Infusion Verify (none) Intravenous Slade, Oneeka S, RN   08/19/2023 1027 Infusion Verify (none) Intravenous Slade, Oneeka S, RN   08/19/2023 2536 New Bag/Given 36 mg Intravenous Slade, Oneeka S, RN      dexamethasone  (DECADRON ) tablet 20 mg     Date Action Dose Route User   06/24/2023 [phone] Given 20 mg Oral Elayne Greaser, RN      dexamethasone  (DECADRON ) tablet 20 mg     Date Action Dose Route User   07/01/2023 0946 Given 20 mg Oral Chilcott, Loren O, RN      dexamethasone  (DECADRON )  tablet 20 mg     Date Action Dose Route User   07/22/2023 1001 Given 20 mg Oral Emmy Harper, Amy H, RN      dexamethasone  (DECADRON ) tablet 20 mg     Date Action Dose Route User   07/29/2023 [phone] Given 20 mg Oral Slade, Oneeka S, RN      dexamethasone  (DECADRON ) tablet 20 mg     Date Action Dose Route User   08/12/2023 1003 Given 20 mg Oral Trayer, Carmel L, RN      dexamethasone  (DECADRON ) tablet 20 mg     Date Action Dose Route User   08/19/2023 [phone] Given 20 mg Oral Slade, Oneeka S, RN      dexamethasone  (DECADRON ) injection 10 mg     Date Action Dose Route User   07/15/2023 0926 Given 10 mg Intravenous Ivette Marks, RN      heparin  lock flush 100 unit/mL     Date Action Dose Route User   06/24/2023 1104 Given 500 Units Intracatheter Elayne Greaser, RN      heparin  lock flush 100 unit/mL     Date Action Dose Route User   07/01/2023 1104 Given 500 Units Intracatheter Chilcott, Asberry Lav, RN      heparin  lock flush 100 unit/mL     Date Action Dose Route User  07/15/2023 1057 Given 500 Units Intracatheter Saunders, Doni H, RN      heparin  lock flush 100 unit/mL     Date Action Dose Route User   07/22/2023 1120 Given 500 Units Intracatheter Elkins, Amy H, RN      heparin  lock flush 100 unit/mL     Date Action Dose Route User   07/29/2023 1132 Given 500 Units Intracatheter Slade, Oneeka S, [place]      heparin  lock flush 100 unit/mL     Date Action Dose Route User   08/12/2023 1118 Given 500 Units Intracatheter Trayer, Carmel L, RN      heparin  lock flush 100 unit/mL     Date Action Dose Route User   08/19/2023 1035 Given 500 Units Intracatheter Slade, Oneeka S, RN      0.9 %  sodium chloride  infusion     Date Action Dose Route User   06/24/2023 0940 New Bag/Given (none) Intravenous Elayne Greaser, RN      0.9 %  sodium chloride  infusion     Date Action Dose Route User   06/24/2023 0941 New Bag/Given (none) Intravenous Elayne Greaser, RN      0.9 %   sodium chloride  infusion     Date Action Dose Route User   07/01/2023 1104 Rate/Dose Change (none) Intravenous Chilcott, Loren O, RN   07/01/2023 1100 Rate/Dose Change (none) Intravenous Chilcott, Loren O, RN   07/01/2023 1015 Rate/Dose Change (none) Intravenous Chilcott, Loren O, RN   07/01/2023 1015 Rate/Dose Change (none) Intravenous Chilcott, Loren O, RN   07/01/2023 0945 New Bag/Given (none) Intravenous Chilcott, Asberry Lav, RN      0.9 %  sodium chloride  infusion     Date Action Dose Route User   07/15/2023 1052 Rate/Dose Change (none) Intravenous Ivette Marks, RN   07/15/2023 1002 Restarted (none) Intravenous Ivette Marks, [place]   07/15/2023 6063 Restarted (none) Intravenous Ivette Marks, [place]   07/15/2023 0160 Rate/Dose Change (none) Intravenous Ivette Marks, RN   07/15/2023 1093 New Bag/Given (none) Intravenous Alayne Allis H, RN      0.9 %  sodium chloride  infusion     Date Action Dose Route User   07/15/2023 0932 Restarted (none) Intravenous Ivette Marks, RN   07/15/2023 2355 New Bag/Given (none) Intravenous Alayne Allis H, RN      0.9 %  sodium chloride  infusion     Date Action Dose Route User   07/22/2023 1116 Rate/Dose Change (none) Intravenous Meryle Acre, RN   07/22/2023 1034 Rate/Dose Change (none) Intravenous Meryle Acre, RN   07/22/2023 1033 Rate/Dose Change (none) Intravenous Meryle Acre, RN   07/22/2023 1003 New Bag/Given (none) Intravenous Emmy Harper, Ulysses Ganja, RN      0.9 %  sodium chloride  infusion     Date Action Dose Route User   07/22/2023 0950 New Bag/Given (none) Intravenous Meryle Acre, RN      0.9 %  sodium chloride  infusion     Date Action Dose Route User   07/29/2023 1132 Rate/Dose Change (none) Intravenous Slade, Oneeka S, RN   07/29/2023 1101 Infusion Verify (none) Intravenous Slade, Oneeka S, RN   07/29/2023 7322 New Bag/Given (none) Intravenous Slade, Oneeka S, RN      0.9 %  sodium chloride  infusion     Date Action Dose Route User    07/29/2023 1046 Rate/Dose Change (none) Intravenous Slade, Oneeka S, RN   07/29/2023 1045 Rate/Dose Change (none) Intravenous Slade, Oneeka S, RN  07/29/2023 0945 New Bag/Given (none) Intravenous Slade, Oneeka S, RN      0.9 %  sodium chloride  infusion     Date Action Dose Route User   08/12/2023 1117 Rate/Dose Change (none) Intravenous Trayer, Carmel L, RN   08/12/2023 1036 New Bag/Given (none) Intravenous Trayer, Carmel L, RN      0.9 %  sodium chloride  infusion     Date Action Dose Route User   08/12/2023 1030 Rate/Dose Change (none) Intravenous Trayer, Carmel L, RN   08/12/2023 1000 New Bag/Given (none) Intravenous Anthony Bateman, RN      0.9 %  sodium chloride  infusion     Date Action Dose Route User   08/19/2023 1034 Rate/Dose Change (none) Intravenous Slade, Oneeka S, RN   08/19/2023 1029 Rate/Dose Change (none) Intravenous Slade, Oneeka S, RN   08/19/2023 1610 New Bag/Given (none) Intravenous Slade, Oneeka S, RN      0.9 %  sodium chloride  infusion     Date Action Dose Route User   08/19/2023 0950 Rate/Dose Change (none) Intravenous Slade, Oneeka S, RN   08/19/2023 0920 Rate/Dose Change (none) Intravenous Slade, Oneeka S, RN   08/19/2023 0920 Rate/Dose Change (none) Intravenous Slade, Oneeka S, RN   08/19/2023 0920 New Bag/Given (none) Intravenous Slade, Oneeka S, RN      sodium chloride  flush (NS) 0.9 % injection 10 mL     Date Action Dose Route User   07/22/2023 0845 Given 10 mL Intravenous Slade, Oneeka S, RN           No data to display          No results found for: "NITRICOXIDE"      Assessment & Plan:   OSA (obstructive sleep apnea) Very severe complex sleep apnea, primarily central events. Improved control on CPAP 10 cmH2O. Suboptimal compliance due to mask. Provided him with AirFit N30i sample, which he found much more comfortable. May need to add chinstrap if he has ongoing mouth breathing but will reassess at follow up. Recheck download to ensure  improved compliance in 4 weeks. Reviewed risks of untreated OSA. Not a candidate for hypoglossal nerve stimulator due to percentage of centrals. Safe driving practices reviewed.   Patient Instructions  Increase use of CPAP every night, minimum of 4-6 hours a night.  Change equipment as directed. Wash your tubing with warm soap and water daily, hang to dry. Wash humidifier portion weekly. Use bottled, distilled water and change daily Be aware of reduced alertness and do not drive or operate heavy machinery if experiencing this or drowsiness.  Exercise encouraged, as tolerated. Healthy weight management discussed.  Avoid or decrease alcohol consumption and medications that make you more sleepy, if possible. Notify if persistent daytime sleepiness occurs even with consistent use of PAP therapy.  Try the new mask I gave you today - AirFit N30i in small wide. If you feel like this leaks out the sides, you can try the medium. I think the small would be too small but you can always try it out. Let me know how this works. We may need to add a chin strap on if you're still mouth breathing with it  We discussed how untreated sleep apnea puts an individual at risk for cardiac arrhthymias, pulm HTN, DM, stroke and increases their risk for daytime accidents.  Follow up in 4 weeks with [place] Jerry Rudine Rieger,NP, or sooner, if needed    Advised if symptoms do not improve or worsen, to please contact office for sooner  follow up or seek emergency care.   I spent 32 minutes of dedicated to the care of this patient on the date of this encounter to include pre-visit review of records, face-to-face time with the patient discussing conditions above, post visit ordering of testing, clinical documentation with the electronic health record, making appropriate referrals as documented, and communicating necessary findings to members of the patients care team.  Roetta Clarke, NP 08/22/2023  Pt aware and understands NP's role.

## 2023-08-22 NOTE — Assessment & Plan Note (Signed)
 Very severe complex sleep apnea, primarily central events. Improved control on CPAP 10 cmH2O. Suboptimal compliance due to mask. Provided him with AirFit N30i sample, which he found much more comfortable. May need to add chinstrap if he has ongoing mouth breathing but will reassess at follow up. Recheck download to ensure improved compliance in 4 weeks. Reviewed risks of untreated OSA. Not a candidate for hypoglossal nerve stimulator due to percentage of centrals. Safe driving practices reviewed.   Patient Instructions  Increase use of CPAP every night, minimum of 4-6 hours a night.  Change equipment as directed. Wash your tubing with warm soap and water daily, hang to dry. Wash humidifier portion weekly. Use bottled, distilled water and change daily Be aware of reduced alertness and do not drive or operate heavy machinery if experiencing this or drowsiness.  Exercise encouraged, as tolerated. Healthy weight management discussed.  Avoid or decrease alcohol consumption and medications that make you more sleepy, if possible. Notify if persistent daytime sleepiness occurs even with consistent use of PAP therapy.  Try the new mask I gave you today - AirFit N30i in small wide. If you feel like this leaks out the sides, you can try the medium. I think the small would be too small but you can always try it out. Let me know how this works. We may need to add a chin strap on if you're still mouth breathing with it  We discussed how untreated sleep apnea puts an individual at risk for cardiac arrhthymias, pulm HTN, DM, stroke and increases their risk for daytime accidents.  Follow up in 4 weeks with Katie Bonner Larue,NP, or sooner, if needed

## 2023-08-22 NOTE — Patient Instructions (Signed)
 Increase use of CPAP every night, minimum of 4-6 hours a night.  Change equipment as directed. Wash your tubing with warm soap and water daily, hang to dry. Wash humidifier portion weekly. Use bottled, distilled water and change daily Be aware of reduced alertness and do not drive or operate heavy machinery if experiencing this or drowsiness.  Exercise encouraged, as tolerated. Healthy weight management discussed.  Avoid or decrease alcohol consumption and medications that make you more sleepy, if possible. Notify if persistent daytime sleepiness occurs even with consistent use of PAP therapy.  Try the new mask I gave you today - AirFit N30i in small wide. If you feel like this leaks out the sides, you can try the medium. I think the small would be too small but you can always try it out. Let me know how this works. We may need to add a chin strap on if you're still mouth breathing with it  We discussed how untreated sleep apnea puts an individual at risk for cardiac arrhthymias, pulm HTN, DM, stroke and increases their risk for daytime accidents.  Follow up in 4 weeks with Katie Leovardo Thoman,NP, or sooner, if needed

## 2023-08-25 ENCOUNTER — Ambulatory Visit: Attending: Student

## 2023-08-25 DIAGNOSIS — R0609 Other forms of dyspnea: Secondary | ICD-10-CM | POA: Diagnosis not present

## 2023-08-25 LAB — ECHOCARDIOGRAM COMPLETE
AR max vel: 2.77 cm2
AV Area VTI: 2.63 cm2
AV Area mean vel: 2.77 cm2
AV Mean grad: 3 mmHg
AV Peak grad: 5.1 mmHg
Ao pk vel: 1.13 m/s
Area-P 1/2: 2.95 cm2
S' Lateral: 2.7 cm

## 2023-08-26 ENCOUNTER — Encounter: Payer: Self-pay | Admitting: Oncology

## 2023-08-26 ENCOUNTER — Inpatient Hospital Stay: Attending: Internal Medicine

## 2023-08-26 ENCOUNTER — Inpatient Hospital Stay (HOSPITAL_BASED_OUTPATIENT_CLINIC_OR_DEPARTMENT_OTHER): Admitting: Oncology

## 2023-08-26 ENCOUNTER — Inpatient Hospital Stay

## 2023-08-26 VITALS — BP 149/111 | HR 92 | Temp 97.8°F | Resp 18 | Ht 67.0 in | Wt 182.1 lb

## 2023-08-26 VITALS — BP 145/99 | HR 82

## 2023-08-26 DIAGNOSIS — Z5112 Encounter for antineoplastic immunotherapy: Secondary | ICD-10-CM | POA: Insufficient documentation

## 2023-08-26 DIAGNOSIS — Z79899 Other long term (current) drug therapy: Secondary | ICD-10-CM

## 2023-08-26 DIAGNOSIS — C9002 Multiple myeloma in relapse: Secondary | ICD-10-CM | POA: Diagnosis not present

## 2023-08-26 DIAGNOSIS — G620 Drug-induced polyneuropathy: Secondary | ICD-10-CM

## 2023-08-26 DIAGNOSIS — Z800 Family history of malignant neoplasm of digestive organs: Secondary | ICD-10-CM | POA: Diagnosis not present

## 2023-08-26 DIAGNOSIS — M25522 Pain in left elbow: Secondary | ICD-10-CM | POA: Diagnosis not present

## 2023-08-26 DIAGNOSIS — Z5111 Encounter for antineoplastic chemotherapy: Secondary | ICD-10-CM

## 2023-08-26 DIAGNOSIS — T451X5A Adverse effect of antineoplastic and immunosuppressive drugs, initial encounter: Secondary | ICD-10-CM

## 2023-08-26 LAB — CBC WITH DIFFERENTIAL (CANCER CENTER ONLY)
Abs Immature Granulocytes: 0.07 10*3/uL (ref 0.00–0.07)
Basophils Absolute: 0 10*3/uL (ref 0.0–0.1)
Basophils Relative: 1 %
Eosinophils Absolute: 0.5 10*3/uL (ref 0.0–0.5)
Eosinophils Relative: 8 %
HCT: 38.6 % — ABNORMAL LOW (ref 39.0–52.0)
Hemoglobin: 13.1 g/dL (ref 13.0–17.0)
Immature Granulocytes: 1 %
Lymphocytes Relative: 21 %
Lymphs Abs: 1.2 10*3/uL (ref 0.7–4.0)
MCH: 31.8 pg (ref 26.0–34.0)
MCHC: 33.9 g/dL (ref 30.0–36.0)
MCV: 93.7 fL (ref 80.0–100.0)
Monocytes Absolute: 0.4 10*3/uL (ref 0.1–1.0)
Monocytes Relative: 7 %
Neutro Abs: 3.6 10*3/uL (ref 1.7–7.7)
Neutrophils Relative %: 62 %
Platelet Count: 192 10*3/uL (ref 150–400)
RBC: 4.12 MIL/uL — ABNORMAL LOW (ref 4.22–5.81)
RDW: 15.3 % (ref 11.5–15.5)
WBC Count: 5.8 10*3/uL (ref 4.0–10.5)
nRBC: 0 % (ref 0.0–0.2)

## 2023-08-26 LAB — CMP (CANCER CENTER ONLY)
ALT: 25 U/L (ref 0–44)
AST: 28 U/L (ref 15–41)
Albumin: 3.2 g/dL — ABNORMAL LOW (ref 3.5–5.0)
Alkaline Phosphatase: 83 U/L (ref 38–126)
Anion gap: 11 (ref 5–15)
BUN: 9 mg/dL (ref 6–20)
CO2: 22 mmol/L (ref 22–32)
Calcium: 8.8 mg/dL — ABNORMAL LOW (ref 8.9–10.3)
Chloride: 104 mmol/L (ref 98–111)
Creatinine: 1.41 mg/dL — ABNORMAL HIGH (ref 0.61–1.24)
GFR, Estimated: 60 mL/min — ABNORMAL LOW (ref >60)
Glucose, Bld: 140 mg/dL — ABNORMAL HIGH (ref 70–99)
Potassium: 4.1 mmol/L (ref 3.5–5.1)
Sodium: 137 mmol/L (ref 135–145)
Total Bilirubin: 0.6 mg/dL (ref 0.0–1.2)
Total Protein: 6 g/dL — ABNORMAL LOW (ref 6.5–8.1)

## 2023-08-26 MED ORDER — DEXAMETHASONE 4 MG PO TABS
20.0000 mg | ORAL_TABLET | Freq: Once | ORAL | Status: AC
  Administered 2023-08-26: 20 mg via ORAL
  Filled 2023-08-26: qty 5

## 2023-08-26 MED ORDER — SODIUM CHLORIDE 0.9 % IV SOLN
Freq: Once | INTRAVENOUS | Status: AC
  Filled 2023-08-26: qty 250

## 2023-08-26 MED ORDER — DEXTROSE 5 % IV SOLN
20.0000 mg/m2 | Freq: Once | INTRAVENOUS | Status: AC
  Administered 2023-08-26: 36 mg via INTRAVENOUS
  Filled 2023-08-26: qty 13

## 2023-08-26 NOTE — Patient Instructions (Signed)
 [facility] - A DEPT OF [facility]  Discharge Instructions: Thank you for choosing [facility] to provide your oncology and hematology care.  If you have a lab appointment with the Cancer Center, please go directly to the Cancer Center and check in at the registration area.  Wear comfortable clothing and clothing appropriate for easy access to any Portacath or PICC line.   We strive to give you quality time with your provider. You may need to reschedule your appointment if you arrive late (15 or more minutes).  Arriving late affects you and other patients whose appointments are after yours.  Also, if you miss three or more appointments without notifying the office, you may be dismissed from the clinic at the provider's discretion.      For prescription refill requests, have your pharmacy contact our office and allow 72 hours for refills to be completed.    Today you received the following chemotherapy and/or immunotherapy agents Kyprolis      To help prevent nausea and vomiting after your treatment, we encourage you to take your nausea medication as directed.  BELOW ARE SYMPTOMS THAT SHOULD BE REPORTED IMMEDIATELY: *FEVER GREATER THAN 100.4 F (38 C) OR HIGHER *CHILLS OR SWEATING *NAUSEA AND VOMITING THAT IS NOT CONTROLLED WITH YOUR NAUSEA MEDICATION *UNUSUAL SHORTNESS OF BREATH *UNUSUAL BRUISING OR BLEEDING *URINARY PROBLEMS (pain or burning when urinating, or frequent urination) *BOWEL PROBLEMS (unusual diarrhea, constipation, pain near the anus) TENDERNESS IN MOUTH AND THROAT WITH OR WITHOUT PRESENCE OF ULCERS (sore throat, sores in mouth, or a toothache) UNUSUAL RASH, SWELLING OR PAIN  UNUSUAL VAGINAL DISCHARGE OR ITCHING   Items with * indicate a potential emergency and should be followed up as soon as possible or go to the Emergency Department if any problems should occur.  Please show the CHEMOTHERAPY ALERT CARD or IMMUNOTHERAPY  ALERT CARD at check-in to the Emergency Department and triage nurse.  Should you have questions after your visit or need to cancel or reschedule your appointment, please contact [facility] - A DEPT OF [facility]  [phone] and follow the prompts.  Office hours are 8:00 a.m. to 4:30 p.m. Monday - Friday. Please note that voicemails left after 4:00 p.m. may not be returned until the following business day.  We are closed weekends and major holidays. You have access to a nurse at all times for urgent questions. Please call the main number to the clinic [phone] and follow the prompts.  For any non-urgent questions, you may also contact your provider using MyChart. We now offer e-Visits for anyone 36 and older to request care online for non-urgent symptoms. For details visit [email or web address].   Also download the MyChart app! Go to the app store, search "MyChart", open the app, select , and log in with your MyChart username and password.

## 2023-08-26 NOTE — Progress Notes (Signed)
 Hematology/Oncology Consult note [facility]  [phone] [phone]  Patient Care Team: Gabriel Amol, NP as PCP - General (Internal Medicine) Brad Cables, MD as PCP - Hematology/Oncology Constancia Delton, MD as PCP - Cardiology (Cardiology) Avonne Boettcher, MD as Consulting Physician (Hematology and Oncology)   Name of the patient: James House  [id number]  [birth year]   Date of visit: 08/26/23  Diagnosis- recurrent kappa light chain multiple myeloma in relapse   Chief complaint/ Reason for visit-on treatment assessment prior to cycle 14-day 15 of kyprolis   Heme/Onc history: Patient is a 53- year-old male with a history of kappa light chain myeloma that was treated by Dr. Mirna Amis.  History is as follows:   1.  Patient presented with renal insufficiency with a creatinine of 2.4 in April 2018.  Kidney biopsy showed myeloma cast nephropathy.  Kappa light chain was 3004 and lambda 0.22 with a kappa lambda ratio of 13,000 655.  Skeletal survey showed multiple calvarial and marrow lesions.  Bone marrow biopsy in May 2018 showed 43% plasma cells.  He received CyBorD for cycle 1 in May 2018 and was subsequently switched to RVD.  He received 4 cycles followed by a repeat bone marrow biopsy in August 2018 which showed no increase in plasma cells.   2.  He underwent autologous stem cell transplantation on 01/30/2017.  He was started on Revlimid  maintenance 10 mg daily in January 2019.   3.  Labs from January February and March 2019 done at [facility] showed no M spike, normal kappa lambda ratio of 1.06.  He was last seen by them in April 2019.   4. Following that patient was admitted to [facility] for healthcare associated pneumonia and was recently discharged.  He wished to transfer his care to us  at this time.  He was on maintenance Revlimid  5 mg which was then reduced to 2.5 mg.  Follows up with pain clinic for chemo-induced peripheral neuropathy with  Dr. Barth Borne.  His urine tested positive for [facility] and therefore patient was discharged from pain clinic   5.  Noted to have gradually increasing free kappa light chainFrom 15 in 20 21-39.5 with a ratio of 3.09.  He was seen by [facility] again and underwent a repeat bone marrow biopsy which did not show any evidence of clonal plasma cells.  PET CT scan also did not show any new active lytic lesions in May 2023..  Revlimid  dose was increased to 15 mg 3 weeks on 1 week off but kappa light chains show continuing to increase   6.  PET CT scan in December 2023 did not show any evidence of active myeloma.  Patient underwent repeat bone marrow biopsy at [facility] which showed 6% plasma cellsConsistent with persistent plasma cell neoplasm.  74 to 86% of isolated plasma cells show loss of T p53 and monosomy 13.  Patient was seen by [name] at [facility] and was recommended Darzalex  Pomalyst  dexamethasone  regimen   7.  Disease progression after 8 weekly cycles of Darazalex kappa light chain increased from 332-646 with a ratio that went up from 39-57 concerning for disease progression.  Patient switched to carfilzomib  Pomalyst  dexamethasone  regimen  Interval history-patient reports ongoing pain in his left elbow for the last 2 weeks which radiates up to his left axilla.  He has baseline fatigue myalgias and low back pain  ECOG PS- 2 Pain scale- 5 Opioid associated constipation- no  Review of systems- Review of Systems  Constitutional:  Positive for malaise/fatigue.  Musculoskeletal:  Positive for back pain, joint pain and myalgias.      Allergies  Allergen Reactions   Morphine  Nausea And Vomiting     Past Medical History:  Diagnosis Date   CKD (chronic kidney disease) stage 3, GFR 30-59 ml/min (HCC)    Elevated sedimentation rate 12/24/2017   Hypertension    Marijuana use 03/28/2021   ABNORMAL (12/04/2020) UDS (+) Carboxy-THC (Marijuana)     Multiple myeloma (HCC) 08/11/2017   Neuropathy    Pneumonia    Severe  sepsis (HCC) 08/11/2017   Shingles 08/03/2019     Past Surgical History:  Procedure Laterality Date   ABDOMINAL SURGERY     COLONOSCOPY WITH PROPOFOL  N/A 05/25/2021   Procedure: COLONOSCOPY WITH PROPOFOL ;  Surgeon: Selena Daily, MD;  Location: [facility] ENDOSCOPY;  Service: Gastroenterology;  Laterality: N/A;   IR IMAGING GUIDED PORT INSERTION  08/02/2022   JOINT REPLACEMENT     right knee   KNEE SURGERY Left     Social History   Socioeconomic History   Marital status: Single    Spouse name: Shelvy Dickens   Number of children: 3   Years of education: Not on file   Highest education level: Not on file  Occupational History   Not on file  Tobacco Use   Smoking status: Former   Smokeless tobacco: Current    Types: Snuff  Vaping Use   Vaping status: Never Used  Substance and Sexual Activity   Alcohol use: No   Drug use: No   Sexual activity: Yes  Other Topics Concern   Not on file  Social History Narrative   Live in private residence with spouse and mother in law   Social Drivers of Health   Financial Resource Strain: Low Risk  (08/12/2017)   Overall Financial Resource Strain (CARDIA)    Difficulty of Paying Living Expenses: Not hard at all  Food Insecurity: No Food Insecurity (08/12/2017)   Hunger Vital Sign    Worried About Running Out of Food in the Last Year: Never true    Ran Out of Food in the Last Year: Never true  Transportation Needs: No Transportation Needs (08/12/2017)   PRAPARE - Administrator, Civil Service (Medical): No    Lack of Transportation (Non-Medical): No  Physical Activity: Sufficiently Active (08/12/2017)   Exercise Vital Sign    Days of Exercise per Week: 7 days    Minutes of Exercise per Session: 30 min  Stress: No Stress Concern Present (08/12/2017)   [employer] of Occupational Health - Occupational Stress Questionnaire    Feeling of Stress : Only a little  Social Connections: Somewhat Isolated (08/12/2017)   Social Connection  and Isolation Panel [NHANES]    Frequency of Communication with Friends and Family: Once a week    Frequency of Social Gatherings with Friends and Family: Once a week    Attends Religious Services: More than 4 times per year    Active Member of [employer] or Organizations: No    Attends Banker Meetings: Never    Marital Status: Married  Catering manager Violence: Not At Risk (08/12/2017)   Humiliation, Afraid, Rape, and Kick questionnaire    Fear of Current or Ex-Partner: No    Emotionally Abused: No    Physically Abused: No    Sexually Abused: No    Family History  Problem Relation Age of Onset   Heart disease Mother    Cancer  Mother 51       Pancreatic   COPD Mother    Diabetes Mother    Hyperlipidemia Mother    Hypertension Mother    Cancer Maternal Uncle        pancreatic   Heart disease Maternal Grandmother    Cancer Maternal Grandmother 62       colon   COPD Maternal Grandmother    Diabetes Maternal Grandmother    Hypertension Maternal Grandmother      Current Outpatient Medications:    acyclovir  (ZOVIRAX ) 400 MG tablet, Take 1 tablet (400 mg total) by mouth 2 (two) times daily., Disp: 60 tablet, Rfl: 2   albuterol  (VENTOLIN  HFA) 108 (90 Base) MCG/ACT inhaler, Inhale 2 puffs into the lungs every 6 (six) hours as needed for wheezing or shortness of breath., Disp: 6.7 g, Rfl: 6   amitriptyline  (ELAVIL ) 25 MG tablet, Take 1 tablet (25 mg total) by mouth at bedtime. For headache prevention and sleep, Disp: 90 tablet, Rfl: 1   amLODipine -benazepril  (LOTREL ) 10-40 MG capsule, Take 1 capsule by mouth daily. for blood pressure., Disp: 90 capsule, Rfl: 1   aspirin  EC 81 MG tablet, Take 1 tablet (81 mg total) by mouth daily., Disp: 30 tablet, Rfl: 2   baclofen  (LIORESAL ) 10 MG tablet, Take 1 tablet (10 mg total) by mouth 4 (four) times daily as needed for muscle spasms., Disp: 360 tablet, Rfl: 1   carvedilol  (COREG ) 25 MG tablet, Take 1 tablet (25 mg total) by mouth 2  (two) times daily with a meal., Disp: 60 tablet, Rfl: 3   celecoxib  (CELEBREX ) 200 MG capsule, Take 1 capsule (200 mg total) by mouth daily., Disp: 30 capsule, Rfl: 2   cyanocobalamin  (VITAMIN B12) 1000 MCG/ML injection, Inject 1 mL (1,000 mcg total) into the muscle every 30 (thirty) days., Disp: 1 mL, Rfl: 11   DULoxetine  (CYMBALTA ) 60 MG capsule, Take 1 capsule (60 mg total) by mouth daily., Disp: 60 capsule, Rfl: 1   furosemide  (LASIX ) 20 MG tablet, Take 1 tablet (20 mg total) by mouth daily. Per Dr. Randy Buttery take for 4 days then stop., Disp: 30 tablet, Rfl: 0   hydrochlorothiazide  (HYDRODIURIL ) 25 MG tablet, Take 1 tablet (25 mg total) by mouth daily., Disp: 90 tablet, Rfl: 3   lidocaine -prilocaine  (EMLA ) cream, Apply 1 Application topically as needed., Disp: 30 g, Rfl: 0   methocarbamol  (ROBAXIN -750) 750 MG tablet, Take 1 tablet (750 mg total) by mouth 3 (three) times daily., Disp: 90 tablet, Rfl: 0   OLANZapine  (ZYPREXA ) 10 MG tablet, Take 1 tablet (10 mg total) by mouth at bedtime. To help with nausea prevention., Disp: 30 tablet, Rfl: 1   oxyCODONE  (ROXICODONE ) 15 MG immediate release tablet, Take 1 tablet (15 mg total) by mouth every 8 (eight) hours as needed for pain., Disp: 90 tablet, Rfl: 0   pantoprazole  (PROTONIX ) 40 MG tablet, Take 1 tablet (40 mg total) by mouth daily., Disp: 30 tablet, Rfl: 0   POMALYST  2 MG capsule, TAKE 1 CAPSULE BY MOUTH 1 TIME A DAY FOR 21 DAYS ON THEN 7 DAYS OFF, Disp: 21 capsule, Rfl: 0   Potassium Chloride  ER 20 MEQ TBCR, Take 1 tablet by mouth 2 (two) times daily., Disp: , Rfl:    potassium chloride  SA (KLOR-CON  M) 20 MEQ tablet, Take 2 tablets (40 mEq total) by mouth daily., Disp: 60 tablet, Rfl: 2   pregabalin  (LYRICA ) 300 MG capsule, TAKE ONE CAPSULE (300MG  TOTAL) BY MOUTH TWICE DAILY, Disp: 180 capsule,  Rfl: 0   prochlorperazine  (COMPAZINE ) 10 MG tablet, Take by mouth every 8 (eight) hours as needed., Disp: , Rfl:   Physical exam:  Vitals:   08/26/23 0901  08/26/23 0904  BP: (!) 154/108 (!) 149/111  Pulse: 92   Resp: 18   Temp: 97.8 F (36.6 C)   TempSrc: Tympanic   SpO2: 100%   Weight: 182 lb 1.6 oz (82.6 kg)   Height: 5\' 7"  (1.702 m)    Physical Exam Cardiovascular:     Rate and Rhythm: Normal rate and regular rhythm.     Heart sounds: Normal heart sounds.  Pulmonary:     Effort: Pulmonary effort is normal.     Breath sounds: Normal breath sounds.  Abdominal:     General: Bowel sounds are normal.     Palpations: Abdomen is soft.  Musculoskeletal:     Comments: No swelling or deformity noted in the left elbow  Skin:    General: Skin is warm and dry.  Neurological:     Mental Status: He is alert and oriented to person, place, and time.      I have personally reviewed labs listed below:    Latest Ref Rng & Units 08/26/2023    8:32 AM  CMP  Glucose 70 - 99 mg/dL 161   BUN 6 - 20 mg/dL 9   Creatinine 0.96 - 0.45 mg/dL 4.09   Sodium 811 - 914 mmol/L 137   Potassium 3.5 - 5.1 mmol/L 4.1   Chloride 98 - 111 mmol/L 104   CO2 22 - 32 mmol/L 22   Calcium 8.9 - 10.3 mg/dL 8.8   Total Protein 6.5 - 8.1 g/dL 6.0   Total Bilirubin 0.0 - 1.2 mg/dL 0.6   Alkaline Phos 38 - 126 U/L 83   AST 15 - 41 U/L 28   ALT 0 - 44 U/L 25       Latest Ref Rng & Units 08/26/2023    8:32 AM  CBC  WBC 4.0 - 10.5 K/uL 5.8   Hemoglobin 13.0 - 17.0 g/dL 78.2   Hematocrit 95.6 - 52.0 % 38.6   Platelets 150 - 400 K/uL 192    I have personally reviewed Radiology images listed below: No images are attached to the encounter.  ECHOCARDIOGRAM COMPLETE Result Date: 08/25/2023    ECHOCARDIOGRAM REPORT   Patient Name:   Daevion Steffan. Date of Exam: 08/25/2023 Medical Rec #:  [id number]        Height:       67.0 in Accession #:    [id number]       Weight:       177.6 lb Date of Birth:  [birth year]         BSA:          1.923 m Patient Age:    52 years         BP:           146/96 mmHg Patient Gender: M                HR:           79 bpm. Exam Location:   [place] Procedure: 2D Echo, Cardiac Doppler, Color Doppler and Strain Analysis (Both            Spectral and Color Flow Doppler were utilized during procedure). Indications:    R06.9 DOE  History:        Patient has prior history of Echocardiogram examinations.  Signs/Symptoms:Shortness of Breath, Dyspnea, Fatigue and                 Syncope; Risk Factors:Hypertension, Sleep Apnea and Former                 Smoker.  Sonographer:    Venson Ginger MHA, BS, RDCS Referring Phys: [id number] [facility] IMPRESSIONS  1. Left ventricular ejection fraction, by estimation, is 55 to 60%. The left ventricle has normal function. The left ventricle has no regional wall motion abnormalities. Left ventricular diastolic parameters are consistent with Grade I diastolic dysfunction (impaired relaxation).  2. Right ventricular systolic function is normal. The right ventricular size is normal.  3. The mitral valve is normal in structure. Mild mitral valve regurgitation.  4. The aortic valve is tricuspid. Aortic valve regurgitation is not visualized. FINDINGS  Left Ventricle: Left ventricular ejection fraction, by estimation, is 55 to 60%. The left ventricle has normal function. The left ventricle has no regional wall motion abnormalities. Global longitudinal strain performed but not reported based on interpreter judgement due to suboptimal tracking. The left ventricular internal cavity size was normal in size. There is no left ventricular hypertrophy. Left ventricular diastolic parameters are consistent with Grade I diastolic dysfunction (impaired relaxation). Right Ventricle: The right ventricular size is normal. No increase in right ventricular wall thickness. Right ventricular systolic function is normal. Left Atrium: Left atrial size was normal in size. Right Atrium: Right atrial size was normal in size. Pericardium: There is no evidence of pericardial effusion. Mitral Valve: The mitral valve is normal in  structure. Mild mitral valve regurgitation. Tricuspid Valve: The tricuspid valve is normal in structure. Tricuspid valve regurgitation is trivial. Aortic Valve: The aortic valve is tricuspid. Aortic valve regurgitation is not visualized. Aortic valve mean gradient measures 3.0 mmHg. Aortic valve peak gradient measures 5.1 mmHg. Aortic valve area, by VTI measures 2.63 cm. Pulmonic Valve: The pulmonic valve was not well visualized. Pulmonic valve regurgitation is not visualized. Aorta: The aortic root is normal in size and structure. Venous: The inferior vena cava was not well visualized. IAS/Shunts: No atrial level shunt detected by color flow Doppler.  LEFT VENTRICLE PLAX 2D LVIDd:         4.05 cm   Diastology LVIDs:         2.70 cm   LV e' medial:    6.53 cm/s LV PW:         0.98 cm   LV E/e' medial:  6.9 LV IVS:        1.14 cm   LV e' lateral:   7.94 cm/s LVOT diam:     2.00 cm   LV E/e' lateral: 5.7 LV SV:         55 LV SV Index:   29 LVOT Area:     3.14 cm  RIGHT VENTRICLE RV S prime:     10.10 cm/s TAPSE (M-mode): 1.9 cm LEFT ATRIUM             Index LA diam:        3.10 cm 1.61 cm/m LA Vol (A2C):   37.1 ml 19.30 ml/m LA Vol (A4C):   26.2 ml 13.63 ml/m LA Biplane Vol: 33.5 ml 17.42 ml/m  AORTIC VALVE AV Area (Vmax):    2.77 cm AV Area (Vmean):   2.77 cm AV Area (VTI):     2.63 cm AV Vmax:           113.00 cm/s AV Vmean:  74.900 cm/s AV VTI:            0.210 m AV Peak Grad:      5.1 mmHg AV Mean Grad:      3.0 mmHg LVOT Vmax:         99.60 cm/s LVOT Vmean:        66.000 cm/s LVOT VTI:          0.176 m LVOT/AV VTI ratio: 0.84  AORTA Ao Sinus diam: 3.16 cm Ao Asc diam:   2.90 cm MITRAL VALVE MV Area (PHT): 2.95 cm    SHUNTS MV Decel Time: 257 msec    Systemic VTI:  0.18 m MV E velocity: 45.10 cm/s  Systemic Diam: 2.00 cm MV A velocity: 86.50 cm/s MV E/A ratio:  0.52 Constancia Delton MD Electronically signed by Constancia Delton MD Signature Date/Time: 08/25/2023/12:45:43 PM    Final       Assessment and plan- Patient is a 53 y.o. male with history of kappa light chain multiple myeloma currently in relapse here for on treatment assessment prior to cycle 14-day 15 of carfilzomib   Counts okay to proceed with cycle 14-day 15 of carfilzomib  today.  He will continue with Pomalyst  2 mg 3 weeks on and 1 week off.  I will see him back in 2 weeks for cycle 15-day 8 of treatment.  Myeloma panel shows no M protein but there has been a gradual rise in his serum free kappa light chains over the last 6 months and the ratio is  going up from 2 to presently 7.  He has an appointment coming up with [facility] next month.  Plan was to consider CAR-T cell therapy if his ratio increases to be on time.  Patient had an echo yesterday which showed normal EF and no regional wall motion abnormalities.  Blood pressure in the clinic was elevated at 149/111 on repeat check it was 145/99.  He will keep his blood pressure readings at home and get in touch with cardiology should the blood pressures remain elevated.  Left elbow pain: He will be trying elbow brace as well as topical IcyHot Voltaren gel.  If symptoms are no better after 1 week we will consider getting an x-ray.  Chemo-induced peripheral neuropathy: Continue Cymbalta  and Lyrica  as well as Xtampza  and as needed oxycodone .  CKD: Overall stable   Visit Diagnosis 1. Multiple myeloma in relapse (HCC)   2. High risk medication use   3. Encounter for antineoplastic chemotherapy   4. Left elbow pain   5. Chemotherapy-induced peripheral neuropathy (HCC)      Dr. Seretha Dance, MD, MPH [facility] at [facility] [phone] 08/26/2023 11:40 AM

## 2023-08-26 NOTE — Progress Notes (Signed)
 Patient reports he is having pain in his left elbow that travels up to his left armpit.

## 2023-08-28 ENCOUNTER — Other Ambulatory Visit: Payer: Self-pay | Admitting: Nurse Practitioner

## 2023-08-29 ENCOUNTER — Other Ambulatory Visit: Payer: Self-pay | Admitting: Nurse Practitioner

## 2023-08-29 ENCOUNTER — Telehealth: Payer: Self-pay

## 2023-08-29 ENCOUNTER — Encounter: Payer: Self-pay | Admitting: Oncology

## 2023-08-29 ENCOUNTER — Other Ambulatory Visit: Payer: Self-pay

## 2023-08-29 DIAGNOSIS — M792 Neuralgia and neuritis, unspecified: Secondary | ICD-10-CM

## 2023-08-29 MED ORDER — OXYCODONE HCL 15 MG PO TABS
15.0000 mg | ORAL_TABLET | Freq: Three times a day (TID) | ORAL | 0 refills | Status: DC | PRN
Start: 2023-08-29 — End: 2023-09-29
  Filled 2023-08-29: qty 90, 30d supply, fill #0

## 2023-08-29 NOTE — Telephone Encounter (Signed)
 Incoming call from patient requesting refills on Oxycodone  15mg  immediate release tablets.

## 2023-09-03 ENCOUNTER — Other Ambulatory Visit: Payer: Self-pay

## 2023-09-03 ENCOUNTER — Other Ambulatory Visit: Payer: Self-pay | Admitting: Oncology

## 2023-09-03 DIAGNOSIS — G620 Drug-induced polyneuropathy: Secondary | ICD-10-CM

## 2023-09-03 DIAGNOSIS — M792 Neuralgia and neuritis, unspecified: Secondary | ICD-10-CM

## 2023-09-03 MED ORDER — PREGABALIN 300 MG PO CAPS
300.0000 mg | ORAL_CAPSULE | Freq: Two times a day (BID) | ORAL | 11 refills | Status: DC
  Filled 2023-09-03: qty 180, 90d supply, fill #0

## 2023-09-04 NOTE — Progress Notes (Signed)
 Cardiology Clinic Note   Date: 09/08/2023 ID: [name], DOB 12/24/1970, MRN [id number]  Primary Cardiologist:  Constancia Delton, MD  Chief Complaint   James House. is a 53 y.o. male who presents to the clinic today for routine follow up.   Patient Profile   James House. is followed by Dr. Junnie Olives for the history outlined below.      Past medical history significant for: Hypertension. CKD stage III. Multiple myeloma. Currently on chemo. Echo 08/25/2023: EF 55 to 60%.  No RWMA.  Grade I DD.  Normal RV size/function.  Mild MR.  In summary, patient was first seen in the office on 02/06/2023 for evaluation of shortness of breath and increasing BP with no cardiac history at the request of Dr. Fulton Job. PCP increased amlodipine  and benazepril  secondary to elevated BP. He recently started new chemo medication with noted side effect of hypertension and increased heart rate. He does report elevated heart rate and occasional palpitations with typical HR in the high 90s to low 100s. Patient was started on carvedilol .  Patient was seen in follow-up in November 2024 after medication changes.  BP continued to be elevated with home readings 158-160/101-120.  He reported mild dizziness and flushed face with BP was elevated however symptoms were also a side effect of his chemotherapy.  Carvedilol  was increased.  Patient was seen in the office on 04/07/2023 for follow-up after medication changes.  Patient reported BP trending down and better controlled overall.  He brought his home monitor which revealed home readings slightly higher than manual BP but not definitive to obtain a new cuff.  His medications were continued and he was instructed to continue to monitor BP at home.   Patient was last seen in the office by me on 07/07/2023 for routine follow-up.  He reported BP running in the 140s/90s after medications.  PCP had increased carvedilol  to 25 mg twice daily.  He reported DOE with some  abdominal bloating/tightness.  Home weight reported as stable.  He was considering discontinuing chemo.  HCTZ was added for better BP control echo was repeated and essentially unchanged from prior echo in June 2024.     History of Present Illness    Today, patient is doing well. He is pending a second opinion from [facility] oncology next month. He is still considering stopping chemo. Patient denies shortness of breath, orthopnea or PND. He had some ankle edema a couple of weeks ago and was placed on a 4 day course of Lasix . He did not feel he urinated more on it but edema resolved. He denies abdominal bloating or fullness. DOE is mile and stable. No chest pain, pressure, or tightness. No palpitations.  BP continues to be labile. It is well controlled today.     ROS: All other systems reviewed and are otherwise negative except as noted in History of Present Illness.  EKGs/Labs Reviewed    EKG Interpretation Date/Time:  Monday Sep 08 2023 11:15:40 EDT Ventricular Rate:  88 PR Interval:  140 QRS Duration:  90 QT Interval:  372 QTC Calculation: 450 R Axis:   0  Text Interpretation: Normal sinus rhythm Normal ECG When compared with ECG of 07-Mar-2023 10:44, No significant change was found Confirmed by Morey Ar [phone]) on 09/08/2023 11:23:42 AM   08/26/2023: ALT 25; AST 28; BUN 9; Creatinine 1.41; Potassium 4.1; Sodium 137   08/26/2023: Hemoglobin 13.1; WBC Count 5.8   07/02/2023: TSH 1.36   01/21/2023: B  Natriuretic Peptide 27.9 07/02/2023: Pro B Natriuretic peptide (BNP) 51.0   Physical Exam    VS:  BP 120/84   Pulse 88   Ht 5\' 7"  (1.702 m)   Wt 181 lb (82.1 kg)   SpO2 96%   BMI 28.35 kg/m  , BMI Body mass index is 28.35 kg/m.  GEN: Well nourished, well developed, in no acute distress. Neck: No JVD or carotid bruits. Cardiac:  RRR. No murmurs. No rubs or gallops.   Respiratory:  Respirations regular and unlabored. Clear to auscultation without rales, wheezing or rhonchi. GI:  Soft, nontender, nondistended. Extremities: Radials/DP/PT 2+ and equal bilaterally. No clubbing or cyanosis. No edema   Skin: Warm and dry, no rash. Neuro: Strength intact.  Assessment & Plan   Hypertension Elevation in BP secondary to chemo.  BP today 120/84.  Home BP readings at home are labile. He   He reports when BP is high he gets headaches and has positional dizziness.  -Continue amlodipine -benazepril , carvedilol , HCTZ.   DOE Echo May 2025 showed normal LV/RV function, Grade I DD, mild MR.  Patient reports DOE is mild and stable. He had some increased ankle edema a couple of weeks ago and took Lasix  for 4 days. He did not have increased urination but the edema resolved. No edema today. Euvolemic and well compensated on exam.  - Continued diuretic not clinically indicated.    Risk of Falls Patient has a history of falls and is now using a cane to prevent further incidents. No recent falls reported. -Continue use of cane to prevent falls.  Disposition: Return in 6 months or sooner as needed.          Signed, Lonell Rives. Ahmaud Duthie, DNP, NP-C

## 2023-09-05 ENCOUNTER — Telehealth: Payer: Self-pay

## 2023-09-05 DIAGNOSIS — M25522 Pain in left elbow: Secondary | ICD-10-CM

## 2023-09-05 NOTE — Telephone Encounter (Signed)
 Incoming call from patient stating he was asked to inform Dr. Randy Buttery about his left arm; patient would like to have an x-ray done on his left arm.  Pain 8/10, pain subsides with warm compress.  Informed patient I would touch base with provider and either myself or scheduling will be following up with patient accordingly.  Patient verbalized understanding.  Dr. Randy Buttery I can put in the order, please advise.

## 2023-09-05 NOTE — Telephone Encounter (Signed)
 Yes we will start with xray

## 2023-09-05 NOTE — Telephone Encounter (Addendum)
 Outbound call to patient who confirmed indeed left elbow pain as stated in the last OV note, not left arm pain.  X-Ray Order placed. Forwarded to scheduling to coordinate with patient

## 2023-09-08 ENCOUNTER — Encounter: Payer: Self-pay | Admitting: Oncology

## 2023-09-08 ENCOUNTER — Ambulatory Visit: Attending: Student | Admitting: Student

## 2023-09-08 ENCOUNTER — Encounter: Payer: Self-pay | Admitting: Student

## 2023-09-08 VITALS — BP 120/84 | HR 88 | Ht 67.0 in | Wt 181.0 lb

## 2023-09-08 DIAGNOSIS — R0609 Other forms of dyspnea: Secondary | ICD-10-CM

## 2023-09-08 DIAGNOSIS — Z9181 History of falling: Secondary | ICD-10-CM | POA: Diagnosis not present

## 2023-09-08 DIAGNOSIS — I10 Essential (primary) hypertension: Secondary | ICD-10-CM

## 2023-09-08 NOTE — Patient Instructions (Signed)
 Medication Instructions:  Your Physician recommend you continue on your current medication as directed.    *If you need a refill on your cardiac medications before your next appointment, please call your pharmacy*  Lab Work: None ordered at this time  If you have labs (blood work) drawn today and your tests are completely normal, you will receive your results only by: MyChart Message (if you have MyChart) OR A paper copy in the mail If you have any lab test that is abnormal or we need to change your treatment, we will call you to review the results.  Testing/Procedures: None ordered at this time   Follow-Up: At [facility], you and your health needs are our priority.  As part of our continuing mission to provide you with exceptional heart care, our providers are all part of one team.  This team includes your primary Cardiologist (physician) and Advanced Practice Providers or APPs (Physician Assistants and Nurse Practitioners) who all work together to provide you with the care you need, when you need it.  Your next appointment:   6 month(s)  Provider:   You may see Constancia Delton, MD or one of the following Advanced Practice Providers on your designated Care Team:   Laneta Pintos, NP Gildardo Labrador, PA-C Varney Gentleman, PA-C Cadence Register, PA-C Ronald Cockayne, NP Morey Ar, NP    We recommend signing up for the patient portal called "MyChart".  Sign up information is provided on this After Visit Summary.  MyChart is used to connect with patients for Virtual Visits (Telemedicine).  Patients are able to view lab/test results, encounter notes, upcoming appointments, etc.  Non-urgent messages can be sent to your provider as well.   To learn more about what you can do with MyChart, go to [email or web address].

## 2023-09-09 ENCOUNTER — Encounter: Payer: Self-pay | Admitting: Oncology

## 2023-09-09 ENCOUNTER — Ambulatory Visit
Admission: RE | Admit: 2023-09-09 | Discharge: 2023-09-09 | Disposition: A | Discharge status: Home / Self Care | Source: Ambulatory Visit | Attending: Oncology | Admitting: Oncology

## 2023-09-09 ENCOUNTER — Inpatient Hospital Stay

## 2023-09-09 VITALS — BP 124/85 | HR 73 | Temp 96.7°F | Resp 18 | Wt 181.8 lb

## 2023-09-09 DIAGNOSIS — M79602 Pain in left arm: Secondary | ICD-10-CM | POA: Diagnosis not present

## 2023-09-09 DIAGNOSIS — M25522 Pain in left elbow: Secondary | ICD-10-CM | POA: Insufficient documentation

## 2023-09-09 DIAGNOSIS — Z5112 Encounter for antineoplastic immunotherapy: Secondary | ICD-10-CM | POA: Diagnosis not present

## 2023-09-09 DIAGNOSIS — M19022 Primary osteoarthritis, left elbow: Secondary | ICD-10-CM | POA: Diagnosis not present

## 2023-09-09 DIAGNOSIS — Z79899 Other long term (current) drug therapy: Secondary | ICD-10-CM

## 2023-09-09 DIAGNOSIS — C9002 Multiple myeloma in relapse: Secondary | ICD-10-CM

## 2023-09-09 LAB — CMP (CANCER CENTER ONLY)
ALT: 22 U/L (ref 0–44)
AST: 27 U/L (ref 15–41)
Albumin: 3.1 g/dL — ABNORMAL LOW (ref 3.5–5.0)
Alkaline Phosphatase: 81 U/L (ref 38–126)
Anion gap: 7 (ref 5–15)
BUN: 8 mg/dL (ref 6–20)
CO2: 24 mmol/L (ref 22–32)
Calcium: 8.4 mg/dL — ABNORMAL LOW (ref 8.9–10.3)
Chloride: 104 mmol/L (ref 98–111)
Creatinine: 1.39 mg/dL — ABNORMAL HIGH (ref 0.61–1.24)
GFR, Estimated: >60 mL/min (ref >60)
Glucose, Bld: 157 mg/dL — ABNORMAL HIGH (ref 70–99)
Potassium: 3.3 mmol/L — ABNORMAL LOW (ref 3.5–5.1)
Sodium: 135 mmol/L (ref 135–145)
Total Bilirubin: 0.7 mg/dL (ref 0.0–1.2)
Total Protein: 6 g/dL — ABNORMAL LOW (ref 6.5–8.1)

## 2023-09-09 LAB — CBC WITH DIFFERENTIAL (CANCER CENTER ONLY)
Abs Immature Granulocytes: 0.05 10*3/uL (ref 0.00–0.07)
Basophils Absolute: 0 10*3/uL (ref 0.0–0.1)
Basophils Relative: 1 %
Eosinophils Absolute: 0.3 10*3/uL (ref 0.0–0.5)
Eosinophils Relative: 6 %
HCT: 38.2 % — ABNORMAL LOW (ref 39.0–52.0)
Hemoglobin: 13.3 g/dL (ref 13.0–17.0)
Immature Granulocytes: 1 %
Lymphocytes Relative: 14 %
Lymphs Abs: 0.8 10*3/uL (ref 0.7–4.0)
MCH: 32.1 pg (ref 26.0–34.0)
MCHC: 34.8 g/dL (ref 30.0–36.0)
MCV: 92.3 fL (ref 80.0–100.0)
Monocytes Absolute: 0.5 10*3/uL (ref 0.1–1.0)
Monocytes Relative: 8 %
Neutro Abs: 4.2 10*3/uL (ref 1.7–7.7)
Neutrophils Relative %: 70 %
Platelet Count: 197 10*3/uL (ref 150–400)
RBC: 4.14 MIL/uL — ABNORMAL LOW (ref 4.22–5.81)
RDW: 15 % (ref 11.5–15.5)
WBC Count: 5.9 10*3/uL (ref 4.0–10.5)
nRBC: 0 % (ref 0.0–0.2)

## 2023-09-09 MED ORDER — HEPARIN SOD (PORK) LOCK FLUSH 100 UNIT/ML IV SOLN
500.0000 [IU] | Freq: Once | INTRAVENOUS | Status: AC | PRN
  Administered 2023-09-09: 500 [IU]
  Filled 2023-09-09: qty 5

## 2023-09-09 MED ORDER — SODIUM CHLORIDE 0.9 % IV SOLN
Freq: Once | INTRAVENOUS | Status: AC
Start: 2023-09-09 — End: 2023-09-09
  Filled 2023-09-09: qty 250

## 2023-09-09 MED ORDER — SODIUM CHLORIDE 0.9 % IV SOLN
Freq: Once | INTRAVENOUS | Status: AC
  Filled 2023-09-09: qty 250

## 2023-09-09 MED ORDER — DEXTROSE 5 % IV SOLN
20.0000 mg/m2 | Freq: Once | INTRAVENOUS | Status: AC
  Administered 2023-09-09: 36 mg via INTRAVENOUS
  Filled 2023-09-09: qty 15

## 2023-09-09 MED ORDER — DEXAMETHASONE SODIUM PHOSPHATE 10 MG/ML IJ SOLN
10.0000 mg | Freq: Once | INTRAMUSCULAR | Status: AC
  Administered 2023-09-09: 10 mg via INTRAVENOUS

## 2023-09-10 LAB — KAPPA/LAMBDA LIGHT CHAINS
Kappa free light chain: 56.1 mg/L — ABNORMAL HIGH (ref 3.3–19.4)
Kappa, lambda light chain ratio: 5.55 — ABNORMAL HIGH (ref 0.26–1.65)
Lambda free light chains: 10.1 mg/L (ref 5.7–26.3)

## 2023-09-11 LAB — MULTIPLE MYELOMA PANEL, SERUM
Albumin SerPl Elph-Mcnc: 2.9 g/dL (ref 2.9–4.4)
Albumin/Glob SerPl: 1.2 (ref 0.7–1.7)
Alpha 1: 0.2 g/dL (ref 0.0–0.4)
Alpha2 Glob SerPl Elph-Mcnc: 0.7 g/dL (ref 0.4–1.0)
B-Globulin SerPl Elph-Mcnc: 1.1 g/dL (ref 0.7–1.3)
Gamma Glob SerPl Elph-Mcnc: 0.5 g/dL (ref 0.4–1.8)
Globulin, Total: 2.6 g/dL (ref 2.2–3.9)
IgA: 93 mg/dL (ref 90–386)
IgG (Immunoglobin G), Serum: 607 mg/dL (ref 603–1613)
IgM (Immunoglobulin M), Srm: 39 mg/dL (ref 20–172)
Total Protein ELP: 5.5 g/dL — ABNORMAL LOW (ref 6.0–8.5)

## 2023-09-12 ENCOUNTER — Encounter: Payer: Self-pay | Admitting: Nurse Practitioner

## 2023-09-12 ENCOUNTER — Ambulatory Visit: Admitting: Nurse Practitioner

## 2023-09-12 ENCOUNTER — Other Ambulatory Visit: Payer: Self-pay

## 2023-09-12 ENCOUNTER — Other Ambulatory Visit: Payer: Self-pay | Admitting: Oncology

## 2023-09-12 ENCOUNTER — Telehealth: Admitting: Nurse Practitioner

## 2023-09-12 ENCOUNTER — Other Ambulatory Visit: Payer: Self-pay | Admitting: Primary Care

## 2023-09-12 DIAGNOSIS — G620 Drug-induced polyneuropathy: Secondary | ICD-10-CM

## 2023-09-12 DIAGNOSIS — M792 Neuralgia and neuritis, unspecified: Secondary | ICD-10-CM

## 2023-09-12 DIAGNOSIS — G4739 Other sleep apnea: Secondary | ICD-10-CM

## 2023-09-12 DIAGNOSIS — C9002 Multiple myeloma in relapse: Secondary | ICD-10-CM

## 2023-09-12 DIAGNOSIS — G4733 Obstructive sleep apnea (adult) (pediatric): Secondary | ICD-10-CM

## 2023-09-12 MED ORDER — POMALIDOMIDE 2 MG PO CAPS: 2.0000 mg | ORAL_CAPSULE | Freq: Every day | ORAL | 0 refills | Status: AC

## 2023-09-12 MED ORDER — PREGABALIN 300 MG PO CAPS
300.0000 mg | ORAL_CAPSULE | Freq: Two times a day (BID) | ORAL | 11 refills | Status: DC
Start: 2023-09-12 — End: 2023-10-10

## 2023-09-12 NOTE — Assessment & Plan Note (Addendum)
 Very severe complex sleep apnea, primarily central events. Complicated by multiple sedating medications. He underwent CPAP titration with optimal setting of  8 cmH2O and adjusted EPR to level 3. He was adjusted to 10 cmH2O after this due to ongoing residual events and sensation of feeling smothered. Despite this, had ongoing intolerance due to mask. He was changed to AirFit N30i, which he has found much more comfortable. Having ongoing snoring and high residual AHI. Will add on chinstrap. Re-adjust pressure to 8 cmH2O. Recheck download to ensure improved compliance in 4 weeks. Reviewed risks of untreated OSA. Not a candidate for hypoglossal nerve stimulator due to percentage of centrals. Safe driving practices reviewed.  May need repeat in lab titration study. He was not evaluated in REM or supine sleep during study and minimal time on optimal setting.   Patient Instructions  Increase use of CPAP every night, minimum of 4-6 hours a night.  Change equipment as directed. Wash your tubing with warm soap and water daily, hang to dry. Wash humidifier portion weekly. Use bottled, distilled water and change daily Be aware of reduced alertness and do not drive or operate heavy machinery if experiencing this or drowsiness.  Exercise encouraged, as tolerated. Healthy weight management discussed.  Avoid or decrease alcohol consumption and medications that make you more sleepy, if possible. Notify if persistent daytime sleepiness occurs even with consistent use of PAP therapy.   Add on chin strap and see if this stops the snoring. You're still having a lot of breakthrough events on your download   Adjust setting back to 8 cmH2O and adjusted your support when you breath out    We discussed how untreated sleep apnea puts an individual at risk for cardiac arrhthymias, pulm HTN, DM, stroke and increases their risk for daytime accidents.   Follow up in 4 weeks with Dr. Kieran Pellet or Katie Gailyn Crook,NP. If symptoms do not  improve or worsen, please contact office for sooner follow up

## 2023-09-12 NOTE — Progress Notes (Deleted)
 @Patient  ID: James Collie., male    DOB: 27-Dec-1970, 53 y.o.   MRN: [id number]  No chief complaint on file.   Referring provider: Gabriel Ellington, NP  HPI: 53 year old male, former smoker followed for complex very severe sleep apnea. He was last seen in office by French Jester, NP. Past medical history significant for HTN, migraines, hx perforated gastric ulcer, multiple myeloma followed by oncology s/p stem cell transplant 2018 with recurrence in 03/2022, chronic back pain and neuropathy on chronic narcotics and muscle relaxers.  TEST/EVENTS:  03/16/2023 HST: AHI 100.4/h, SpO2 low 84%. 94% central events  04/29/2023 CPAP titration >> optimal CPAP 8 cmH2O  05/29/2023: OV with Parrett, NP. Seen in November for a sleep consult. Has snoring and daytime sleepiness. HST - very severe sleep apnea with central predominant and nocturnal hypoxia. He had CPAP titration 04/29/2023 that showed optimal control on CPAP 8 cmH2O. He was started on CPAP; got it about 2 weeks ago. Having difficulties tolerating. Feels he is smothering at times. DME gave him a different FFM than what was ordered. Mask is very uncomfortable. Download showed excellent compliance with average 6.5 hours; however, still having residual AHI 23/h with CAI 22.4/h. did recently restart amitriptyline . Adjusted to auto 8-16 cmH2O and ordered ResMed F30i wide mask.  08/22/2023: Today - follow up Discussed the use of AI scribe software for clinical note transcription with the patient, who gave verbal consent to proceed.  History of Present Illness   James Minahan. is a 53 year old male with sleep apnea who presents for follow-up on CPAP therapy. His CPAP pressures were adjusted again after his last visit to a set pressure of 10 cmH2O.  He has been using his CPAP machine inconsistently over the past month, with improved usage over the last two nights. He's still having issues with the mask and comfort of this. Does feel like pressures are better.  AHI is down to 6-7/h.   He is dissatisfied with his current mask, which covers both the nose and mouth, and wants to try a nasal mask.   While using the CPAP, he does not snore, but he does snore when not using it. He has noticed some improvement in the restfulness of his sleep with CPAP use but has not experienced a significant change in daytime energy levels yet.  He acknowledges that he has not been using the CPAP every night, which may be affecting his energy levels.     07/23/2023-08/21/2023: CPAP 10 cmH2O 5/30 days; 10% >4 hr; average use 3 hr 58 min Leaks 95th 32.4 AHI 6.9, CAI 6.2   Allergies  Allergen Reactions   Morphine  Nausea And Vomiting    Immunization History  Administered Date(s) Administered   Influenza,inj,Quad PF,6+ Mos 01/19/2016, 02/13/2018   Pneumococcal Conjugate-13 02/13/2018, 04/16/2018, 06/18/2018   Tdap 03/01/2016, 02/13/2018, 04/16/2018, 06/18/2018, 04/09/2020   Zoster Recombinant(Shingrix ) 11/21/2021    Past Medical History:  Diagnosis Date   CKD (chronic kidney disease) stage 3, GFR 30-59 ml/min (HCC)    Elevated sedimentation rate 12/24/2017   Hypertension    Marijuana use 03/28/2021   ABNORMAL (12/04/2020) UDS (+) Carboxy-THC (Marijuana)     Multiple myeloma (HCC) 08/11/2017   Neuropathy    Pneumonia    Severe sepsis (HCC) 08/11/2017   Shingles 08/03/2019    Tobacco History: Social History   Tobacco Use  Smoking Status Former  Smokeless Tobacco Current   Types: Snuff   Ready to quit: Not Answered  Counseling given: Not Answered   Outpatient Medications Prior to Visit  Medication Sig Dispense Refill   acyclovir  (ZOVIRAX ) 400 MG tablet Take 1 tablet (400 mg total) by mouth 2 (two) times daily. 60 tablet 2   albuterol  (VENTOLIN  HFA) 108 (90 Base) MCG/ACT inhaler Inhale 2 puffs into the lungs every 6 (six) hours as needed for wheezing or shortness of breath. 6.7 g 6   amitriptyline  (ELAVIL ) 25 MG tablet Take 1 tablet (25 mg total) by  mouth at bedtime. For headache prevention and sleep 90 tablet 1   amLODipine -benazepril  (LOTREL ) 10-40 MG capsule Take 1 capsule by mouth daily. for blood pressure. 90 capsule 1   ASPIRIN  EC ADULT LOW DOSE 81 MG tablet TAKE ONE TABLET (81MG  TOTAL) BY MOUTH DAILY AT 9AM 30 tablet 1   baclofen  (LIORESAL ) 10 MG tablet Take 1 tablet (10 mg total) by mouth 4 (four) times daily as needed for muscle spasms. 360 tablet 1   carvedilol  (COREG ) 25 MG tablet Take 1 tablet (25 mg total) by mouth 2 (two) times daily with a meal. 60 tablet 3   celecoxib  (CELEBREX ) 200 MG capsule Take 1 capsule (200 mg total) by mouth daily. 30 capsule 2   cyanocobalamin  (VITAMIN B12) 1000 MCG/ML injection Inject 1 mL (1,000 mcg total) into the muscle every 30 (thirty) days. 1 mL 11   DULoxetine  (CYMBALTA ) 60 MG capsule Take 1 capsule (60 mg total) by mouth daily. 60 capsule 1   furosemide  (LASIX ) 20 MG tablet Take 1 tablet (20 mg total) by mouth daily. Per Dr. Randy Buttery take for 4 days then stop. 30 tablet 0   hydrochlorothiazide  (HYDRODIURIL ) 25 MG tablet Take 1 tablet (25 mg total) by mouth daily. 90 tablet 3   lidocaine -prilocaine  (EMLA ) cream Apply 1 Application topically as needed. 30 g 0   methocarbamol  (ROBAXIN -750) 750 MG tablet Take 1 tablet (750 mg total) by mouth 3 (three) times daily. 90 tablet 0   OLANZapine  (ZYPREXA ) 10 MG tablet Take 1 tablet (10 mg total) by mouth at bedtime. To help with nausea prevention. 30 tablet 1   oxyCODONE  (ROXICODONE ) 15 MG immediate release tablet Take 1 tablet (15 mg total) by mouth every 8 (eight) hours as needed for pain. 90 tablet 0   pantoprazole  (PROTONIX ) 40 MG tablet Take 1 tablet (40 mg total) by mouth daily. 30 tablet 0   Potassium Chloride  ER 20 MEQ TBCR Take 1 tablet by mouth 2 (two) times daily.     potassium chloride  SA (KLOR-CON  M) 20 MEQ tablet TAKE TWO TABLETS ( TOTAL) BY MOUTH DAILY AT 9AM 60 tablet 1   pregabalin  (LYRICA ) 300 MG capsule Take 1 capsule (300 mg total) by  mouth 2 (two) times daily. 180 capsule 11   prochlorperazine  (COMPAZINE ) 10 MG tablet Take by mouth every 8 (eight) hours as needed.     No facility-administered medications prior to visit.     Review of Systems:   Constitutional: No weight loss or gain, night sweats, fevers, chills,  or lassitude. +fatigue  HEENT: No headaches, difficulty swallowing, tooth/dental problems, or sore throat. No sneezing, itching, ear ache, nasal congestion, or post nasal drip CV:  No chest pain, orthopnea, PND, swelling in lower extremities, Resp: +snoring. No shortness of breath with exertion or at rest. No cough GI:  No heartburn, indigestion GU: No nocturia  MSK:  +chronic pain  Neuro: No dizziness or lightheadedness.  Psych: No depression or anxiety. Mood stable. +sleep disturbance     Physical  Exam:  There were no vitals taken for this visit.  GEN: Pleasant, interactive, well-kempt; in no acute distress HEENT:  Normocephalic and atraumatic. PERRLA. Sclera white. Nasal turbinates pink, moist and patent bilaterally. No rhinorrhea present. Oropharynx pink and moist, without exudate or edema. No lesions, ulcerations, or postnasal drip. Mallampati II NECK:  Supple w/ fair ROM. No lymphadenopathy.   CV: RRR, no m/r/g, no peripheral edema. Pulses intact, +2 bilaterally. No cyanosis, pallor or clubbing. PULMONARY:  Unlabored, regular breathing. Clear bilaterally A&P w/o wheezes/rales/rhonchi. No accessory muscle use.  GI: BS present and normoactive. Soft, non-tender to palpation. No organomegaly or masses detected.  MSK: No erythema, warmth or tenderness. Cap refil <2 sec all extrem.  Neuro: A/Ox3. No focal deficits noted.   Skin: Warm, no lesions or rashe Psych: Normal affect and behavior. Judgement and thought content appropriate.     Lab Results:  CBC    Component Value Date/Time   WBC 5.9 09/09/2023 0814   WBC 12.6 (H) 09/19/2022 0847   RBC 4.14 (L) 09/09/2023 0814   HGB 13.3 09/09/2023  0814   HCT 38.2 (L) 09/09/2023 0814   PLT 197 09/09/2023 0814   MCV 92.3 09/09/2023 0814   MCH 32.1 09/09/2023 0814   MCHC 34.8 09/09/2023 0814   RDW 15.0 09/09/2023 0814   LYMPHSABS 0.8 09/09/2023 0814   MONOABS 0.5 09/09/2023 0814   EOSABS 0.3 09/09/2023 0814   BASOSABS 0.0 09/09/2023 0814    BMET    Component Value Date/Time   NA 135 09/09/2023 0814   NA 140 11/26/2017 1307   K 3.3 (L) 09/09/2023 0814   CL 104 09/09/2023 0814   CO2 24 09/09/2023 0814   GLUCOSE 157 (H) 09/09/2023 0814   BUN 8 09/09/2023 0814   BUN 11 11/26/2017 1307   CREATININE 1.39 (H) 09/09/2023 0814   CALCIUM 8.4 (L) 09/09/2023 0814   GFRNONAA >60 09/09/2023 0814   GFRAA >60 12/31/2019 1401    BNP    Component Value Date/Time   BNP 27.9 01/21/2023 0826     Imaging:  ECHOCARDIOGRAM COMPLETE Result Date: 08/25/2023    ECHOCARDIOGRAM REPORT   Patient Name:   James Havard. Date of Exam: 08/25/2023 Medical Rec #:  [id number]        Height:       67.0 in Accession #:    [id number]       Weight:       177.6 lb Date of Birth:  02-13-1971         BSA:          1.923 m Patient Age:    52 years         BP:           146/96 mmHg Patient Gender: M                HR:           79 bpm. Exam Location:  [facility] Procedure: 2D Echo, Cardiac Doppler, Color Doppler and Strain Analysis (Both            Spectral and Color Flow Doppler were utilized during procedure). Indications:    R06.9 DOE  History:        Patient has prior history of Echocardiogram examinations.                 Signs/Symptoms:Shortness of Breath, Dyspnea, Fatigue and  Syncope; Risk Factors:Hypertension, Sleep Apnea and Former                 Smoker.  Sonographer:    Venson Ginger MHA, BS, RDCS Referring Phys: [id number] [facility] [place] IMPRESSIONS  1. Left ventricular ejection fraction, by estimation, is 55 to 60%. The left ventricle has normal function. The left ventricle has no regional wall motion abnormalities. Left ventricular  diastolic parameters are consistent with Grade I diastolic dysfunction (impaired relaxation).  2. Right ventricular systolic function is normal. The right ventricular size is normal.  3. The mitral valve is normal in structure. Mild mitral valve regurgitation.  4. The aortic valve is tricuspid. Aortic valve regurgitation is not visualized. FINDINGS  Left Ventricle: Left ventricular ejection fraction, by estimation, is 55 to 60%. The left ventricle has normal function. The left ventricle has no regional wall motion abnormalities. Global longitudinal strain performed but not reported based on interpreter judgement due to suboptimal tracking. The left ventricular internal cavity size was normal in size. There is no left ventricular hypertrophy. Left ventricular diastolic parameters are consistent with Grade I diastolic dysfunction (impaired relaxation). Right Ventricle: The right ventricular size is normal. No increase in right ventricular wall thickness. Right ventricular systolic function is normal. Left Atrium: Left atrial size was normal in size. Right Atrium: Right atrial size was normal in size. Pericardium: There is no evidence of pericardial effusion. Mitral Valve: The mitral valve is normal in structure. Mild mitral valve regurgitation. Tricuspid Valve: The tricuspid valve is normal in structure. Tricuspid valve regurgitation is trivial. Aortic Valve: The aortic valve is tricuspid. Aortic valve regurgitation is not visualized. Aortic valve mean gradient measures 3.0 mmHg. Aortic valve peak gradient measures 5.1 mmHg. Aortic valve area, by VTI measures 2.63 cm. Pulmonic Valve: The pulmonic valve was not well visualized. Pulmonic valve regurgitation is not visualized. Aorta: The aortic root is normal in size and structure. Venous: The inferior vena cava was not well visualized. IAS/Shunts: No atrial level shunt detected by color flow Doppler.  LEFT VENTRICLE PLAX 2D LVIDd:         4.05 cm   Diastology LVIDs:          2.70 cm   LV e' medial:    6.53 cm/s LV PW:         0.98 cm   LV E/e' medial:  6.9 LV IVS:        1.14 cm   LV e' lateral:   7.94 cm/s LVOT diam:     2.00 cm   LV E/e' lateral: 5.7 LV SV:         55 LV SV Index:   29 LVOT Area:     3.14 cm  RIGHT VENTRICLE RV S prime:     10.10 cm/s TAPSE (M-mode): 1.9 cm LEFT ATRIUM             Index LA diam:        3.10 cm 1.61 cm/m LA Vol (A2C):   37.1 ml 19.30 ml/m LA Vol (A4C):   26.2 ml 13.63 ml/m LA Biplane Vol: 33.5 ml 17.42 ml/m  AORTIC VALVE AV Area (Vmax):    2.77 cm AV Area (Vmean):   2.77 cm AV Area (VTI):     2.63 cm AV Vmax:           113.00 cm/s AV Vmean:          74.900 cm/s AV VTI:  0.210 m AV Peak Grad:      5.1 mmHg AV Mean Grad:      3.0 mmHg LVOT Vmax:         99.60 cm/s LVOT Vmean:        66.000 cm/s LVOT VTI:          0.176 m LVOT/AV VTI ratio: 0.84  AORTA Ao Sinus diam: 3.16 cm Ao Asc diam:   2.90 cm MITRAL VALVE MV Area (PHT): 2.95 cm    SHUNTS MV Decel Time: 257 msec    Systemic VTI:  0.18 m MV E velocity: 45.10 cm/s  Systemic Diam: 2.00 cm MV A velocity: 86.50 cm/s MV E/A ratio:  0.52 Constancia Delton MD Electronically signed by Constancia Delton MD Signature Date/Time: 08/25/2023/12:45:43 PM    Final     carfilzomib  (KYPROLIS ) 36 mg in dextrose  5 % 50 mL chemo infusion     Date Action Dose Route User   07/15/2023 1014 Infusion Verify (none) Intravenous Ivette Marks, RN   07/15/2023 1014 New Bag/Given 36 mg Intravenous Ivette Marks, RN      carfilzomib  (KYPROLIS ) 36 mg in dextrose  5 % 50 mL chemo infusion     Date Action Dose Route User   07/22/2023 1037 Infusion Verify (none) Intravenous Meryle Acre, RN   07/22/2023 1037 Rate/Dose Change (none) Intravenous Meryle Acre, RN   07/22/2023 1037 New Bag/Given 36 mg Intravenous Meryle Acre, RN      carfilzomib  (KYPROLIS ) 36 mg in dextrose  5 % 50 mL chemo infusion     Date Action Dose Route User   07/29/2023 1129 Rate/Dose Change (none) Intravenous Slade, Oneeka  S, RN   07/29/2023 1129 Rate/Dose Change (none) Intravenous Slade, Oneeka S, RN   07/29/2023 1101 Infusion Verify (none) Intravenous Slade, Oneeka S, RN   07/29/2023 1052 Infusion Verify (none) Intravenous Ava Boatman, RN   07/29/2023 1052 New Bag/Given 36 mg Intravenous Slade, Oneeka S, RN      carfilzomib  (KYPROLIS ) 36 mg in dextrose  5 % 50 mL chemo infusion     Date Action Dose Route User   08/12/2023 1117 Rate/Dose Change (none) Intravenous Anthony Bateman, RN   08/12/2023 1037 Infusion Verify (none) Intravenous Anthony Bateman, RN   08/12/2023 1037 New Bag/Given 36 mg Intravenous Quillian Brunt L, RN      carfilzomib  (KYPROLIS ) 36 mg in dextrose  5 % 50 mL chemo infusion     Date Action Dose Route User   08/19/2023 1029 Rate/Dose Change (none) Intravenous Slade, Oneeka S, RN   08/19/2023 6962 Infusion Verify (none) Intravenous Slade, Oneeka S, RN   08/19/2023 9528 Infusion Verify (none) Intravenous Ava Boatman, RN   08/19/2023 4132 New Bag/Given 36 mg Intravenous Slade, Oneeka S, RN      carfilzomib  (KYPROLIS ) 36 mg in dextrose  5 % 50 mL chemo infusion     Date Action Dose Route User   08/26/2023 1028 Infusion Verify (none) Intravenous Ammie Bale, RN   08/26/2023 1028 Rate/Dose Change (none) Intravenous Ammie Bale, RN   08/26/2023 1028 New Bag/Given 36 mg Intravenous Easter, Tiffany S, RN      carfilzomib  (KYPROLIS ) 36 mg in dextrose  5 % 50 mL chemo infusion     Date Action Dose Route User   09/09/2023 1001 Infusion Verify (none) Intravenous Meryle Acre, RN   09/09/2023 1001 Rate/Dose Change (none) Intravenous Meryle Acre, RN   09/09/2023 1001 New Bag/Given 36 mg Intravenous Emmy Harper, Amy  H, RN      dexamethasone  (DECADRON ) tablet 20 mg     Date Action Dose Route User   07/22/2023 1001 Given 20 mg Oral Emmy Harper, Amy H, RN      dexamethasone  (DECADRON ) tablet 20 mg     Date Action Dose Route User   07/29/2023 [phone] Given 20 mg Oral Slade, Oneeka S, RN       dexamethasone  (DECADRON ) tablet 20 mg     Date Action Dose Route User   08/12/2023 1003 Given 20 mg Oral Trayer, Carmel L, RN      dexamethasone  (DECADRON ) tablet 20 mg     Date Action Dose Route User   08/19/2023 [phone] Given 20 mg Oral Slade, Oneeka S, RN      dexamethasone  (DECADRON ) tablet 20 mg     Date Action Dose Route User   08/26/2023 [phone] Given 20 mg Oral Easter, Tiffany S, RN      dexamethasone  (DECADRON ) injection 10 mg     Date Action Dose Route User   07/15/2023 [phone] Given 10 mg Intravenous Ivette Marks, RN      dexamethasone  (DECADRON ) injection 10 mg     Date Action Dose Route User   09/09/2023 0910 Given 10 mg Intravenous Meryle Acre, RN      heparin  lock flush 100 unit/mL     Date Action Dose Route User   07/15/2023 1057 Given 500 Units Intracatheter Ivette Marks, RN      heparin  lock flush 100 unit/mL     Date Action Dose Route User   07/22/2023 1120 Given 500 Units Intracatheter Elkins, Virginia H, RN      heparin  lock flush 100 unit/mL     Date Action Dose Route User   07/29/2023 1132 Given 500 Units Intracatheter Slade, Oneeka S, RN      heparin  lock flush 100 unit/mL     Date Action Dose Route User   08/12/2023 1118 Given 500 Units Intracatheter Trayer, Carmel L, RN      heparin  lock flush 100 unit/mL     Date Action Dose Route User   08/19/2023 1035 Given 500 Units Intracatheter Slade, Oneeka S, RN      heparin  lock flush 100 unit/mL     Date Action Dose Route User   09/09/2023 1045 Given 500 Units Intracatheter Elkins, Virginia H, RN      0.9 %  sodium chloride  infusion     Date Action Dose Route User   07/15/2023 1052 Rate/Dose Change (none) Intravenous Ivette Marks, RN   07/15/2023 1002 Restarted (none) Intravenous Ivette Marks, [place]   07/15/2023 2440 Restarted (none) Intravenous Ivette Marks, RN   07/15/2023 1027 Rate/Dose Change (none) Intravenous Ivette Marks, RN   07/15/2023 2536 New Bag/Given (none)  Intravenous Alayne Allis H, RN      0.9 %  sodium chloride  infusion     Date Action Dose Route User   07/15/2023 0932 Restarted (none) Intravenous Ivette Marks, RN   07/15/2023 6440 New Bag/Given (none) Intravenous Alayne Allis H, RN      0.9 %  sodium chloride  infusion     Date Action Dose Route User   07/22/2023 1116 Rate/Dose Change (none) Intravenous Meryle Acre, RN   07/22/2023 1034 Rate/Dose Change (none) Intravenous Meryle Acre, RN   07/22/2023 1033 Rate/Dose Change (none) Intravenous Meryle Acre, RN   07/22/2023 1003 New Bag/Given (none) Intravenous Meryle Acre, RN  0.9 %  sodium chloride  infusion     Date Action Dose Route User   07/22/2023 0950 New Bag/Given (none) Intravenous Emmy Harper, Ulysses Ganja, RN      0.9 %  sodium chloride  infusion     Date Action Dose Route User   07/29/2023 1132 Rate/Dose Change (none) Intravenous Slade, Oneeka S, RN   07/29/2023 1101 Infusion Verify (none) Intravenous Slade, Oneeka S, RN   07/29/2023 1610 New Bag/Given (none) Intravenous Slade, Oneeka S, RN      0.9 %  sodium chloride  infusion     Date Action Dose Route User   07/29/2023 1046 Rate/Dose Change (none) Intravenous Slade, Oneeka S, RN   07/29/2023 1045 Rate/Dose Change (none) Intravenous Slade, Oneeka S, RN   07/29/2023 0945 New Bag/Given (none) Intravenous Slade, Oneeka S, RN      0.9 %  sodium chloride  infusion     Date Action Dose Route User   08/12/2023 1117 Rate/Dose Change (none) Intravenous Anthony Bateman, RN   08/12/2023 1036 New Bag/Given (none) Intravenous Trayer, Carmel L, RN      0.9 %  sodium chloride  infusion     Date Action Dose Route User   08/12/2023 1030 Rate/Dose Change (none) Intravenous Anthony Bateman, RN   08/12/2023 1000 New Bag/Given (none) Intravenous Trayer, Carmel L, RN      0.9 %  sodium chloride  infusion     Date Action Dose Route User   08/19/2023 1034 Rate/Dose Change (none) Intravenous Slade, Oneeka S, RN   08/19/2023 1029 Rate/Dose  Change (none) Intravenous Slade, Oneeka S, RN   08/19/2023 9604 New Bag/Given (none) Intravenous Slade, Oneeka S, RN      0.9 %  sodium chloride  infusion     Date Action Dose Route User   08/19/2023 0950 Rate/Dose Change (none) Intravenous Slade, Oneeka S, RN   08/19/2023 0920 Rate/Dose Change (none) Intravenous Slade, Oneeka S, RN   08/19/2023 0920 Rate/Dose Change (none) Intravenous Slade, Oneeka S, RN   08/19/2023 0920 New Bag/Given (none) Intravenous Slade, Oneeka S, RN      0.9 %  sodium chloride  infusion     Date Action Dose Route User   08/26/2023 0957 Infusion Verify (none) Intravenous Ammie Bale, [place]   08/26/2023 0957 Bolus from Bag (none) Intravenous Easter, Tiffany S, RN      0.9 %  sodium chloride  infusion     Date Action Dose Route User   08/26/2023 [phone] Restarted (none) Intravenous Ammie Bale, [place]   08/26/2023 8119 New Bag/Given (none) Intravenous Easter, Tiffany S, RN      0.9 %  sodium chloride  infusion     Date Action Dose Route User   09/09/2023 1042 Rate/Dose Change (none) Intravenous Meryle Acre, RN   09/09/2023 1038 Rate/Dose Change (none) Intravenous Meryle Acre, RN   09/09/2023 1478 Rate/Dose Change (none) Intravenous Meryle Acre, RN   09/09/2023 2956 Rate/Dose Change (none) Intravenous Meryle Acre, RN   09/09/2023 2130 Rate/Dose Change (none) Intravenous Meryle Acre, RN      0.9 %  sodium chloride  infusion     Date Action Dose Route User   09/09/2023 [phone] Rate/Dose Change (none) Intravenous Meryle Acre, RN   09/09/2023 8469 New Bag/Given (none) Intravenous Meryle Acre, RN      sodium chloride  flush (NS) 0.9 % injection 10 mL     Date Action Dose Route User   07/22/2023 0845 Given 10 mL Intravenous Slade, Oneeka S, RN  No data to display          No results found for: "NITRICOXIDE"      Assessment & Plan:   No problem-specific Assessment & Plan notes found for this encounter.   Advised if symptoms do not  improve or worsen, to please contact office for sooner follow up or seek emergency care.   I spent 32 minutes of dedicated to the care of this patient on the date of this encounter to include pre-visit review of records, face-to-face time with the patient discussing conditions above, post visit ordering of testing, clinical documentation with the electronic health record, making appropriate referrals as documented, and communicating necessary findings to members of the patients care team.  James Clarke, NP 09/12/2023  Pt aware and understands NP's role.

## 2023-09-12 NOTE — Telephone Encounter (Signed)
 Copied from CRM [id number]. Topic: Clinical - Medication Refill >> Sep 12, 2023 10:54 AM Elle L wrote: Medication: pregabalin  (LYRICA ) 300 MG capsule   Has the patient contacted their pharmacy? Yes, the pharmacy called on behalf of the patient.   This is the patient's preferred pharmacy:   [facility] [place] [address] [place] [address] Phone: [phone] Fax: [phone]  Is this the correct pharmacy for this prescription? Yes  Has the prescription been filled recently? Yes  Is the patient out of the medication? No  Has the patient been seen for an appointment in the last year OR does the patient have an upcoming appointment? Yes  Can we respond through MyChart? No  Agent: Please be advised that Rx refills may take up to 3 business days. We ask that you follow-up with your pharmacy.

## 2023-09-12 NOTE — Telephone Encounter (Signed)
 Last Fill: 09/03/23 pt requesting alternate pharmacy    Routing to provider for review/authorization.

## 2023-09-12 NOTE — Progress Notes (Signed)
 Patient ID: James Collie., male     DOB: 1970/06/13, 52 y.o.      MRN: [id number]  No chief complaint on file.   Virtual Visit via Video Note  I connected with James W Lenk Jr. on 09/12/23 at  2:00 PM EDT by a video enabled telemedicine application and verified that I am speaking with the correct person using two identifiers.  Location: Patient: Home Provider: Office   I discussed the limitations of evaluation and management by telemedicine and the availability of in person appointments. The patient expressed understanding and agreed to proceed.  History of Present Illness: 53 year old male, former smoker followed for complex very severe sleep apnea. He was last seen in office 08/22/2023 by James House. Past medical history significant for HTN, migraines, hx perforated gastric ulcer, multiple myeloma followed by oncology s/p stem cell transplant 2018 with recurrence in 03/2022, chronic back pain and neuropathy on chronic narcotics and muscle relaxers.   TEST/EVENTS:  03/16/2023 HST: AHI 100.4/h, SpO2 low 84%. 94% central events  04/29/2023 CPAP titration >> optimal CPAP 8 cmH2O   05/29/2023: OV with James House. Seen in November for a sleep consult. Has snoring and daytime sleepiness. HST - very severe sleep apnea with central predominant and nocturnal hypoxia. He had CPAP titration 04/29/2023 that showed optimal control on CPAP 8 cmH2O. He was started on CPAP; got it about 2 weeks ago. Having difficulties tolerating. Feels he is smothering at times. DME gave him a different FFM than what was ordered. Mask is very uncomfortable. Download showed excellent compliance with average 6.5 hours; however, still having residual AHI 23/h with CAI 22.4/h. did recently restart amitriptyline . Adjusted to auto 8-16 cmH2O and ordered ResMed F30i wide mask.   08/22/2023: James House with [facility] Discussed the use of AI scribe software for clinical note transcription with the patient, who gave verbal consent to proceed. James  W Mcclees Jr. is a 53 year old male with sleep apnea who presents for follow-up on CPAP therapy. His CPAP pressures were adjusted again after his last visit to a set pressure of 10 cmH2O. He has been using his CPAP machine inconsistently over the past month, with improved usage over the last two nights. He's still having issues with the mask and comfort of this. Does feel like pressures are better. AHI is down to 6-7/h.  He is dissatisfied with his current mask, which covers both the nose and mouth, and wants to try a nasal mask.  While using the CPAP, he does not snore, but he does snore when not using it. He has noticed some improvement in the restfulness of his sleep with CPAP use but has not experienced a significant change in daytime energy levels yet. He acknowledges that he has not been using the CPAP every night, which may be affecting his energy levels.  07/23/2023-08/21/2023: CPAP 10 cmH2O 5/30 days; 10% >4 hr; average use 3 hr 58 min Leaks 95th 32.4 AHI 6.9, CAI 6.2  09/12/2023: Today - follow up Patient presents today for follow up.  He has been doing some better with CPAP usage.  Has been able to wear it longer stretches but still not wearing it every night.  His daughter did tell him that he is still snoring when using it.  He overall does not feel significantly different.  Does feel like maybe he is a little bit better rested when he uses the CPAP and without it.  Denies any issues with drowsy  driving.  No changes in his medications since his last visit.  He does like the nasal mask much better than the fullface masks he has used in the past.  It is actually very comfortable for him and easy for him to use.  08/12/2023-09/10/2023: CPAP 10 cmH2O 13/30 days; 33% >4 hr; average use 5 hours 11 minutes Leaks 95th 24.3 AHI 32.4, CAI 27.8  Allergies  Allergen Reactions   Morphine  Nausea And Vomiting   Immunization History  Administered Date(s) Administered   Influenza,inj,Quad PF,6+ Mos  01/19/2016, 02/13/2018   Pneumococcal Conjugate-13 02/13/2018, 04/16/2018, 06/18/2018   Tdap 03/01/2016, 02/13/2018, 04/16/2018, 06/18/2018, 04/09/2020   Zoster Recombinant(Shingrix ) 11/21/2021   Past Medical History:  Diagnosis Date   CKD (chronic kidney disease) stage 3, GFR 30-59 ml/min (HCC)    Elevated sedimentation rate 12/24/2017   Hypertension    Marijuana use 03/28/2021   ABNORMAL (12/04/2020) UDS (+) Carboxy-THC (Marijuana)     Multiple myeloma (HCC) 08/11/2017   Neuropathy    Pneumonia    Severe sepsis (HCC) 08/11/2017   Shingles 08/03/2019    Tobacco History: Social History   Tobacco Use  Smoking Status Former  Smokeless Tobacco Current   Types: Snuff   Ready to quit: Not Answered Counseling given: Not Answered   Outpatient Medications Prior to Visit  Medication Sig Dispense Refill   acyclovir  (ZOVIRAX ) 400 MG tablet Take 1 tablet (400 mg total) by mouth 2 (two) times daily. 60 tablet 2   albuterol  (VENTOLIN  HFA) 108 (90 Base) MCG/ACT inhaler Inhale 2 puffs into the lungs every 6 (six) hours as needed for wheezing or shortness of breath. 6.7 g 6   amitriptyline  (ELAVIL ) 25 MG tablet Take 1 tablet (25 mg total) by mouth at bedtime. For headache prevention and sleep 90 tablet 1   amLODipine -benazepril  (LOTREL ) 10-40 MG capsule Take 1 capsule by mouth daily. for blood pressure. 90 capsule 1   ASPIRIN  EC ADULT LOW DOSE 81 MG tablet TAKE ONE TABLET (81MG  TOTAL) BY MOUTH DAILY AT 9AM 30 tablet 1   baclofen  (LIORESAL ) 10 MG tablet Take 1 tablet (10 mg total) by mouth 4 (four) times daily as needed for muscle spasms. 360 tablet 1   carvedilol  (COREG ) 25 MG tablet Take 1 tablet (25 mg total) by mouth 2 (two) times daily with a meal. 60 tablet 3   celecoxib  (CELEBREX ) 200 MG capsule Take 1 capsule (200 mg total) by mouth daily. 30 capsule 2   cyanocobalamin  (VITAMIN B12) 1000 MCG/ML injection Inject 1 mL (1,000 mcg total) into the muscle every 30 (thirty) days. 1 mL 11    DULoxetine  (CYMBALTA ) 60 MG capsule Take 1 capsule (60 mg total) by mouth daily. 60 capsule 1   furosemide  (LASIX ) 20 MG tablet Take 1 tablet (20 mg total) by mouth daily. Per Dr. Randy Buttery take for 4 days then stop. 30 tablet 0   hydrochlorothiazide  (HYDRODIURIL ) 25 MG tablet Take 1 tablet (25 mg total) by mouth daily. 90 tablet 3   lidocaine -prilocaine  (EMLA ) cream Apply 1 Application topically as needed. 30 g 0   methocarbamol  (ROBAXIN -750) 750 MG tablet Take 1 tablet (750 mg total) by mouth 3 (three) times daily. 90 tablet 0   OLANZapine  (ZYPREXA ) 10 MG tablet Take 1 tablet (10 mg total) by mouth at bedtime. To help with nausea prevention. 30 tablet 1   oxyCODONE  (ROXICODONE ) 15 MG immediate release tablet Take 1 tablet (15 mg total) by mouth every 8 (eight) hours as needed for pain. 90 tablet  0   pantoprazole  (PROTONIX ) 40 MG tablet Take 1 tablet (40 mg total) by mouth daily. 30 tablet 0   Potassium Chloride  ER 20 MEQ TBCR Take 1 tablet by mouth 2 (two) times daily.     potassium chloride  SA (KLOR-CON  M) 20 MEQ tablet TAKE TWO TABLETS ( TOTAL) BY MOUTH DAILY AT 9AM 60 tablet 1   pregabalin  (LYRICA ) 300 MG capsule Take 1 capsule (300 mg total) by mouth 2 (two) times daily. 180 capsule 11   prochlorperazine  (COMPAZINE ) 10 MG tablet Take by mouth every 8 (eight) hours as needed.     No facility-administered medications prior to visit.     Review of Systems:   Constitutional: No weight loss or gain, night sweats, fevers, chills,  or lassitude. +fatigue  HEENT: No headaches, difficulty swallowing, tooth/dental problems, or sore throat. No sneezing, itching, ear ache, nasal congestion, or post nasal drip CV:  No chest pain, orthopnea, PND, swelling in lower extremities, Resp: +snoring. No shortness of breath with exertion or at rest. No cough GI:  No heartburn, indigestion GU: No nocturia  MSK:  +chronic pain  Neuro: No dizziness or lightheadedness.  Psych: No depression or anxiety. Mood  stable. +sleep disturbance  Observations/Objective: Patient is well-developed, well-nourished in no acute distress.  Resting comfortably at home.  No labored breathing.  Speech is clear and coherent with logical content.  Patient is alert and oriented at baseline.   Assessment and Plan: OSA (obstructive sleep apnea) Very severe complex sleep apnea, primarily central events. Complicated by multiple sedating medications. He underwent CPAP titration with optimal setting of  8 cmH2O and adjusted EPR to level 3. He was adjusted to 10 cmH2O after this due to ongoing residual events and sensation of feeling smothered. Despite this, had ongoing intolerance due to mask. He was changed to AirFit N30i, which he has found much more comfortable. Having ongoing snoring and high residual AHI. Will add on chinstrap. Re-adjust pressure to 8 cmH2O. Recheck download to ensure improved compliance in 4 weeks. Reviewed risks of untreated OSA. Not a candidate for hypoglossal nerve stimulator due to percentage of centrals. Safe driving practices reviewed.  May need repeat in lab titration study. He was not evaluated in REM or supine sleep during study and minimal time on optimal setting.   Patient Instructions  Increase use of CPAP every night, minimum of 4-6 hours a night.  Change equipment as directed. Wash your tubing with warm soap and water daily, hang to dry. Wash humidifier portion weekly. Use bottled, distilled water and change daily Be aware of reduced alertness and do not drive or operate heavy machinery if experiencing this or drowsiness.  Exercise encouraged, as tolerated. Healthy weight management discussed.  Avoid or decrease alcohol consumption and medications that make you more sleepy, if possible. Notify if persistent daytime sleepiness occurs even with consistent use of PAP therapy.   Add on chin strap and see if this stops the snoring. You're still having a lot of breakthrough events on your  download   Adjust setting back to 8 cmH2O and adjusted your support when you breath out    We discussed how untreated sleep apnea puts an individual at risk for cardiac arrhthymias, pulm HTN, DM, stroke and increases their risk for daytime accidents.   Follow up in 4 weeks with Dr. Kieran Pellet or Katie Loreda Silverio,House. If symptoms do not improve or worsen, please contact office for sooner follow up    I discussed the assessment and treatment plan  with the patient. The patient was provided an opportunity to ask questions and all were answered. The patient agreed with the plan and demonstrated an understanding of the instructions.   The patient was advised to call back or seek an in-person evaluation if the symptoms worsen or if the condition fails to improve as anticipated.  I provided 32 minutes of non-face-to-face time during this encounter.   Roetta Clarke, House

## 2023-09-12 NOTE — Patient Instructions (Addendum)
 Increase use of CPAP every night, minimum of 4-6 hours a night.  Change equipment as directed. Wash your tubing with warm soap and water daily, hang to dry. Wash humidifier portion weekly. Use bottled, distilled water and change daily Be aware of reduced alertness and do not drive or operate heavy machinery if experiencing this or drowsiness.  Exercise encouraged, as tolerated. Healthy weight management discussed.  Avoid or decrease alcohol consumption and medications that make you more sleepy, if possible. Notify if persistent daytime sleepiness occurs even with consistent use of PAP therapy.   Add on chin strap and see if this stops the snoring. You're still having a lot of breakthrough events on your download   Adjust setting back to 8 cmH2O and adjusted your support when you breath out    We discussed how untreated sleep apnea puts an individual at risk for cardiac arrhthymias, pulm HTN, DM, stroke and increases their risk for daytime accidents.   Follow up in 4 weeks with Dr. Kieran Pellet or Katie Aricka Goldberger,NP. If symptoms do not improve or worsen, please contact office for sooner follow up

## 2023-09-16 ENCOUNTER — Encounter: Payer: Self-pay | Admitting: Oncology

## 2023-09-16 ENCOUNTER — Inpatient Hospital Stay

## 2023-09-16 ENCOUNTER — Inpatient Hospital Stay (HOSPITAL_BASED_OUTPATIENT_CLINIC_OR_DEPARTMENT_OTHER): Admitting: Oncology

## 2023-09-16 VITALS — BP 131/98 | HR 92 | Temp 98.4°F | Resp 16 | Ht 67.0 in | Wt 183.6 lb

## 2023-09-16 DIAGNOSIS — G620 Drug-induced polyneuropathy: Secondary | ICD-10-CM

## 2023-09-16 DIAGNOSIS — C9002 Multiple myeloma in relapse: Secondary | ICD-10-CM | POA: Diagnosis not present

## 2023-09-16 DIAGNOSIS — Z5112 Encounter for antineoplastic immunotherapy: Secondary | ICD-10-CM | POA: Diagnosis not present

## 2023-09-16 DIAGNOSIS — Z79899 Other long term (current) drug therapy: Secondary | ICD-10-CM

## 2023-09-16 DIAGNOSIS — Z5111 Encounter for antineoplastic chemotherapy: Secondary | ICD-10-CM | POA: Diagnosis not present

## 2023-09-16 DIAGNOSIS — G4733 Obstructive sleep apnea (adult) (pediatric): Secondary | ICD-10-CM | POA: Diagnosis not present

## 2023-09-16 DIAGNOSIS — T451X5A Adverse effect of antineoplastic and immunosuppressive drugs, initial encounter: Secondary | ICD-10-CM

## 2023-09-16 LAB — CBC WITH DIFFERENTIAL (CANCER CENTER ONLY)
Abs Immature Granulocytes: 0.04 10*3/uL (ref 0.00–0.07)
Basophils Absolute: 0 10*3/uL (ref 0.0–0.1)
Basophils Relative: 1 %
Eosinophils Absolute: 0.5 10*3/uL (ref 0.0–0.5)
Eosinophils Relative: 10 %
HCT: 38.8 % — ABNORMAL LOW (ref 39.0–52.0)
Hemoglobin: 13.2 g/dL (ref 13.0–17.0)
Immature Granulocytes: 1 %
Lymphocytes Relative: 20 %
Lymphs Abs: 0.9 10*3/uL (ref 0.7–4.0)
MCH: 31.7 pg (ref 26.0–34.0)
MCHC: 34 g/dL (ref 30.0–36.0)
MCV: 93 fL (ref 80.0–100.0)
Monocytes Absolute: 0.4 10*3/uL (ref 0.1–1.0)
Monocytes Relative: 8 %
Neutro Abs: 2.8 10*3/uL (ref 1.7–7.7)
Neutrophils Relative %: 60 %
Platelet Count: 202 10*3/uL (ref 150–400)
RBC: 4.17 MIL/uL — ABNORMAL LOW (ref 4.22–5.81)
RDW: 15.1 % (ref 11.5–15.5)
WBC Count: 4.6 10*3/uL (ref 4.0–10.5)
nRBC: 0 % (ref 0.0–0.2)

## 2023-09-16 LAB — CMP (CANCER CENTER ONLY)
ALT: 19 U/L (ref 0–44)
AST: 29 U/L (ref 15–41)
Albumin: 3.2 g/dL — ABNORMAL LOW (ref 3.5–5.0)
Alkaline Phosphatase: 77 U/L (ref 38–126)
Anion gap: 7 (ref 5–15)
BUN: 9 mg/dL (ref 6–20)
CO2: 23 mmol/L (ref 22–32)
Calcium: 8.6 mg/dL — ABNORMAL LOW (ref 8.9–10.3)
Chloride: 107 mmol/L (ref 98–111)
Creatinine: 1.54 mg/dL — ABNORMAL HIGH (ref 0.61–1.24)
GFR, Estimated: 54 mL/min — ABNORMAL LOW (ref >60)
Glucose, Bld: 159 mg/dL — ABNORMAL HIGH (ref 70–99)
Potassium: 3.7 mmol/L (ref 3.5–5.1)
Sodium: 137 mmol/L (ref 135–145)
Total Bilirubin: 0.7 mg/dL (ref 0.0–1.2)
Total Protein: 5.9 g/dL — ABNORMAL LOW (ref 6.5–8.1)

## 2023-09-16 MED ORDER — HEPARIN SOD (PORK) LOCK FLUSH 100 UNIT/ML IV SOLN
500.0000 [IU] | Freq: Once | INTRAVENOUS | Status: AC | PRN
  Administered 2023-09-16: 500 [IU]
  Filled 2023-09-16: qty 5

## 2023-09-16 MED ORDER — SODIUM CHLORIDE 0.9 % IV SOLN
20.0000 mg | Freq: Once | INTRAVENOUS | Status: AC
  Administered 2023-09-16: 20 mg via INTRAVENOUS
  Filled 2023-09-16: qty 20

## 2023-09-16 MED ORDER — SODIUM CHLORIDE 0.9 % IV SOLN
Freq: Once | INTRAVENOUS | Status: AC
  Filled 2023-09-16: qty 250

## 2023-09-16 MED ORDER — DEXTROSE 5 % IV SOLN
20.0000 mg/m2 | Freq: Once | INTRAVENOUS | Status: AC
  Administered 2023-09-16: 36 mg via INTRAVENOUS
  Filled 2023-09-16: qty 13

## 2023-09-16 MED ORDER — DEXAMETHASONE 4 MG PO TABS: 20.0000 mg | ORAL_TABLET | Freq: Once | ORAL | Status: DC

## 2023-09-16 NOTE — Progress Notes (Signed)
 Hematology/Oncology Consult note [facility]  [phone] [phone]  Patient Care Team: Gabriel Jonah, NP as PCP - General (Internal Medicine) Brad Cables, MD as PCP - Hematology/Oncology Constancia Delton, MD as PCP - Cardiology (Cardiology) Avonne Boettcher, MD as Consulting Physician (Hematology and Oncology)   Name of the patient: James House  [id number]  1971-03-04   Date of visit: 09/16/23  Diagnosis- recurrent kappa light chain multiple myeloma in relapse   Chief complaint/ Reason for visit-on treatment assessment prior to cycle 15-day 8 of Kyprolis   Heme/Onc history: Patient is a 53- year-old male with a history of kappa light chain myeloma that was treated by Dr. Mirna Amis.  History is as follows:   1.  Patient presented with renal insufficiency with a creatinine of 2.4 in April 2018.  Kidney biopsy showed myeloma cast nephropathy.  Kappa light chain was 3004 and lambda 0.22 with a kappa lambda ratio of 13,000 655.  Skeletal survey showed multiple calvarial and marrow lesions.  Bone marrow biopsy in May 2018 showed 43% plasma cells.  He received CyBorD for cycle 1 in May 2018 and was subsequently switched to RVD.  He received 4 cycles followed by a repeat bone marrow biopsy in August 2018 which showed no increase in plasma cells.   2.  He underwent autologous stem cell transplantation on 01/30/2017.  He was started on Revlimid  maintenance 10 mg daily in January 2019.   3.  Labs from January February and March 2019 done at [facility] showed no M spike, normal kappa lambda ratio of 1.06.  He was last seen by them in April 2019.   4. Following that patient was admitted to [facility] for healthcare associated pneumonia and was recently discharged.  He wished to transfer his care to us  at this time.  He was on maintenance Revlimid  5 mg which was then reduced to 2.5 mg.  Follows up with pain clinic for chemo-induced peripheral neuropathy with  Dr. Barth Borne.  His urine tested positive for [facility] and therefore patient was discharged from pain clinic   5.  Noted to have gradually increasing free kappa light chainFrom 15 in 20 21-39.5 with a ratio of 3.09.  He was seen by [facility] again and underwent a repeat bone marrow biopsy which did not show any evidence of clonal plasma cells.  PET CT scan also did not show any new active lytic lesions in May 2023..  Revlimid  dose was increased to 15 mg 3 weeks on 1 week off but kappa light chains show continuing to increase   6.  PET CT scan in December 2023 did not show any evidence of active myeloma.  Patient underwent repeat bone marrow biopsy at [facility] which showed 6% plasma cellsConsistent with persistent plasma cell neoplasm.  74 to 86% of isolated plasma cells show loss of T p53 and monosomy 13.  Patient was seen by [name] at [facility] and was recommended Darzalex  Pomalyst  dexamethasone  regimen   7.  Disease progression after 8 weekly cycles of Darazalex kappa light chain increased from 332-646 with a ratio that went up from 39-57 concerning for disease progression.  Patient switched to carfilzomib  Pomalyst  dexamethasone  regimen  Interval history-patient is tired of being on treatment since 2018.  He is asking about what would happen if he were to stop all treatments altogether.  He also has an upcoming appointment with [facility] next week.  Continues to have pain in his left elbow  ECOG PS- 2  Pain scale- 4 Opioid associated constipation- no  Review of systems- Review of Systems  Constitutional:  Positive for malaise/fatigue. Negative for chills, fever and weight loss.  HENT:  Negative for congestion, ear discharge and nosebleeds.   Eyes:  Negative for blurred vision.  Respiratory:  Negative for cough, hemoptysis, sputum production, shortness of breath and wheezing.   Cardiovascular:  Negative for chest pain, palpitations, orthopnea and claudication.  Gastrointestinal:  Negative for abdominal pain, blood in  stool, constipation, diarrhea, heartburn, melena, nausea and vomiting.  Genitourinary:  Negative for dysuria, flank pain, frequency, hematuria and urgency.  Musculoskeletal:  Positive for back pain and joint pain. Negative for myalgias.  Skin:  Negative for rash.  Neurological:  Positive for sensory change (Peripheral neuropathy). Negative for dizziness, tingling, focal weakness, seizures, weakness and headaches.  Endo/Heme/Allergies:  Does not bruise/bleed easily.  Psychiatric/Behavioral:  Negative for depression and suicidal ideas. The patient does not have insomnia.       Allergies  Allergen Reactions   Morphine  Nausea And Vomiting     Past Medical History:  Diagnosis Date   CKD (chronic kidney disease) stage 3, GFR 30-59 ml/min (HCC)    Elevated sedimentation rate 12/24/2017   Hypertension    Marijuana use 03/28/2021   ABNORMAL (12/04/2020) UDS (+) Carboxy-THC (Marijuana)     Multiple myeloma (HCC) 08/11/2017   Neuropathy    Pneumonia    Severe sepsis (HCC) 08/11/2017   Shingles 08/03/2019     Past Surgical History:  Procedure Laterality Date   ABDOMINAL SURGERY     COLONOSCOPY WITH PROPOFOL  N/A 05/25/2021   Procedure: COLONOSCOPY WITH PROPOFOL ;  Surgeon: Selena Daily, MD;  Location: [facility] ENDOSCOPY;  Service: Gastroenterology;  Laterality: N/A;   IR IMAGING GUIDED PORT INSERTION  08/02/2022   JOINT REPLACEMENT     right knee   KNEE SURGERY Left     Social History   Socioeconomic History   Marital status: Single    Spouse name: Shelvy Dickens   Number of children: 3   Years of education: Not on file   Highest education level: Not on file  Occupational History   Not on file  Tobacco Use   Smoking status: Former   Smokeless tobacco: Current    Types: Snuff  Vaping Use   Vaping status: Never Used  Substance and Sexual Activity   Alcohol use: No   Drug use: No   Sexual activity: Yes  Other Topics Concern   Not on file  Social History Narrative   Live in  private residence with spouse and mother in law   Social Drivers of Health   Financial Resource Strain: Low Risk  (08/12/2017)   Overall Financial Resource Strain (CARDIA)    Difficulty of Paying Living Expenses: Not hard at all  Food Insecurity: No Food Insecurity (08/12/2017)   Hunger Vital Sign    Worried About Running Out of Food in the Last Year: Never true    Ran Out of Food in the Last Year: Never true  Transportation Needs: No Transportation Needs (08/12/2017)   PRAPARE - Administrator, Civil Service (Medical): No    Lack of Transportation (Non-Medical): No  Physical Activity: Sufficiently Active (08/12/2017)   Exercise Vital Sign    Days of Exercise per Week: 7 days    Minutes of Exercise per Session: 30 min  Stress: No Stress Concern Present (08/12/2017)   [employer] of Occupational Health - Occupational Stress Questionnaire    Feeling of Stress :  Only a little  Social Connections: Somewhat Isolated (08/12/2017)   Social Connection and Isolation Panel [NHANES]    Frequency of Communication with Friends and Family: Once a week    Frequency of Social Gatherings with Friends and Family: Once a week    Attends Religious Services: More than 4 times per year    Active Member of [employer] or Organizations: No    Attends Banker Meetings: Never    Marital Status: Married  Catering manager Violence: Not At Risk (08/12/2017)   Humiliation, Afraid, Rape, and Kick questionnaire    Fear of Current or Ex-Partner: No    Emotionally Abused: No    Physically Abused: No    Sexually Abused: No    Family History  Problem Relation Age of Onset   Heart disease Mother    Cancer Mother 22       Pancreatic   COPD Mother    Diabetes Mother    Hyperlipidemia Mother    Hypertension Mother    Cancer Maternal Uncle        pancreatic   Heart disease Maternal Grandmother    Cancer Maternal Grandmother 95       colon   COPD Maternal Grandmother    Diabetes Maternal  Grandmother    Hypertension Maternal Grandmother      Current Outpatient Medications:    acyclovir  (ZOVIRAX ) 400 MG tablet, Take 1 tablet (400 mg total) by mouth 2 (two) times daily., Disp: 60 tablet, Rfl: 2   albuterol  (VENTOLIN  HFA) 108 (90 Base) MCG/ACT inhaler, Inhale 2 puffs into the lungs every 6 (six) hours as needed for wheezing or shortness of breath., Disp: 6.7 g, Rfl: 6   amitriptyline  (ELAVIL ) 25 MG tablet, Take 1 tablet (25 mg total) by mouth at bedtime. For headache prevention and sleep, Disp: 90 tablet, Rfl: 1   amLODipine -benazepril  (LOTREL ) 10-40 MG capsule, Take 1 capsule by mouth daily. for blood pressure., Disp: 90 capsule, Rfl: 1   ASPIRIN  EC ADULT LOW DOSE 81 MG tablet, TAKE ONE TABLET (81MG  TOTAL) BY MOUTH DAILY AT 9AM, Disp: 30 tablet, Rfl: 1   baclofen  (LIORESAL ) 10 MG tablet, Take 1 tablet (10 mg total) by mouth 4 (four) times daily as needed for muscle spasms., Disp: 360 tablet, Rfl: 1   carvedilol  (COREG ) 25 MG tablet, Take 1 tablet (25 mg total) by mouth 2 (two) times daily with a meal., Disp: 60 tablet, Rfl: 3   celecoxib  (CELEBREX ) 200 MG capsule, Take 1 capsule (200 mg total) by mouth daily., Disp: 30 capsule, Rfl: 2   cyanocobalamin  (VITAMIN B12) 1000 MCG/ML injection, Inject 1 mL (1,000 mcg total) into the muscle every 30 (thirty) days., Disp: 1 mL, Rfl: 11   DULoxetine  (CYMBALTA ) 60 MG capsule, Take 1 capsule (60 mg total) by mouth daily., Disp: 60 capsule, Rfl: 1   furosemide  (LASIX ) 20 MG tablet, Take 1 tablet (20 mg total) by mouth daily. Per Dr. Randy Buttery take for 4 days then stop., Disp: 30 tablet, Rfl: 0   hydrochlorothiazide  (HYDRODIURIL ) 25 MG tablet, Take 1 tablet (25 mg total) by mouth daily., Disp: 90 tablet, Rfl: 3   lidocaine -prilocaine  (EMLA ) cream, Apply 1 Application topically as needed., Disp: 30 g, Rfl: 0   methocarbamol  (ROBAXIN -750) 750 MG tablet, Take 1 tablet (750 mg total) by mouth 3 (three) times daily., Disp: 90 tablet, Rfl: 0   OLANZapine   (ZYPREXA ) 10 MG tablet, Take 1 tablet (10 mg total) by mouth at bedtime. To help with nausea  prevention., Disp: 30 tablet, Rfl: 1   oxyCODONE  (ROXICODONE ) 15 MG immediate release tablet, Take 1 tablet (15 mg total) by mouth every 8 (eight) hours as needed for pain., Disp: 90 tablet, Rfl: 0   pantoprazole  (PROTONIX ) 40 MG tablet, Take 1 tablet (40 mg total) by mouth daily., Disp: 30 tablet, Rfl: 0   pomalidomide  (POMALYST ) 2 MG capsule, Take 1 capsule (2 mg total) by mouth daily., Disp: 21 capsule, Rfl: 0   Potassium Chloride  ER 20 MEQ TBCR, Take 1 tablet by mouth 2 (two) times daily., Disp: , Rfl:    potassium chloride  SA (KLOR-CON  M) 20 MEQ tablet, TAKE TWO TABLETS ( TOTAL) BY MOUTH DAILY AT 9AM, Disp: 60 tablet, Rfl: 1   pregabalin  (LYRICA ) 300 MG capsule, Take 1 capsule (300 mg total) by mouth 2 (two) times daily., Disp: 180 capsule, Rfl: 11   prochlorperazine  (COMPAZINE ) 10 MG tablet, Take by mouth every 8 (eight) hours as needed., Disp: , Rfl:   Physical exam: There were no vitals filed for this visit. Physical Exam Cardiovascular:     Rate and Rhythm: Normal rate and regular rhythm.     Heart sounds: Normal heart sounds.  Pulmonary:     Effort: Pulmonary effort is normal.     Breath sounds: Normal breath sounds.  Skin:    General: Skin is warm and dry.  Neurological:     Mental Status: He is alert and oriented to person, place, and time.      I have personally reviewed labs listed below:    Latest Ref Rng & Units 09/09/2023    8:14 AM  CMP  Glucose 70 - 99 mg/dL 161   BUN 6 - 20 mg/dL 8   Creatinine 0.96 - 0.45 mg/dL 4.09   Sodium 811 - 914 mmol/L 135   Potassium 3.5 - 5.1 mmol/L 3.3   Chloride 98 - 111 mmol/L 104   CO2 22 - 32 mmol/L 24   Calcium 8.9 - 10.3 mg/dL 8.4   Total Protein 6.5 - 8.1 g/dL 6.0   Total Bilirubin 0.0 - 1.2 mg/dL 0.7   Alkaline Phos 38 - 126 U/L 81   AST 15 - 41 U/L 27   ALT 0 - 44 U/L 22       Latest Ref Rng & Units 09/09/2023    8:14 AM   CBC  WBC 4.0 - 10.5 K/uL 5.9   Hemoglobin 13.0 - 17.0 g/dL 78.2   Hematocrit 95.6 - 52.0 % 38.2   Platelets 150 - 400 K/uL 197    I have personally reviewed Radiology images listed below: No images are attached to the encounter.  DG Elbow 2 Views Left Result Date: 09/14/2023 CLINICAL DATA:  Left arm pain. Patient reports fall 1 or 2 months ago. EXAM: LEFT ELBOW - 2 VIEW COMPARISON:  None Available. FINDINGS: There is no evidence of fracture, dislocation, or joint effusion. There is moderate degenerative spurring. No erosions or focal bone abnormality. Soft tissues are unremarkable. IMPRESSION: Moderate osteoarthritis. No acute fracture or posttraumatic findings. Electronically Signed   By: Chadwick Colonel M.D.   On: 09/14/2023 18:22   ECHOCARDIOGRAM COMPLETE Result Date: 08/25/2023    ECHOCARDIOGRAM REPORT   Patient Name:   Siddhant Hashemi. Date of Exam: 08/25/2023 Medical Rec #:  [id number]        Height:       67.0 in Accession #:    [id number]       Weight:  177.6 lb Date of Birth:  07-10-1970         BSA:          1.923 m Patient Age:    52 years         BP:           146/96 mmHg Patient Gender: M                HR:           79 bpm. Exam Location:  [place] Procedure: 2D Echo, Cardiac Doppler, Color Doppler and Strain Analysis (Both            Spectral and Color Flow Doppler were utilized during procedure). Indications:    R06.9 DOE  History:        Patient has prior history of Echocardiogram examinations.                 Signs/Symptoms:Shortness of Breath, Dyspnea, Fatigue and                 Syncope; Risk Factors:Hypertension, Sleep Apnea and Former                 Smoker.  Sonographer:    Venson Ginger MHA, BS, RDCS Referring Phys: [id number] [facility] IMPRESSIONS  1. Left ventricular ejection fraction, by estimation, is 55 to 60%. The left ventricle has normal function. The left ventricle has no regional wall motion abnormalities. Left ventricular diastolic parameters are  consistent with Grade I diastolic dysfunction (impaired relaxation).  2. Right ventricular systolic function is normal. The right ventricular size is normal.  3. The mitral valve is normal in structure. Mild mitral valve regurgitation.  4. The aortic valve is tricuspid. Aortic valve regurgitation is not visualized. FINDINGS  Left Ventricle: Left ventricular ejection fraction, by estimation, is 55 to 60%. The left ventricle has normal function. The left ventricle has no regional wall motion abnormalities. Global longitudinal strain performed but not reported based on interpreter judgement due to suboptimal tracking. The left ventricular internal cavity size was normal in size. There is no left ventricular hypertrophy. Left ventricular diastolic parameters are consistent with Grade I diastolic dysfunction (impaired relaxation). Right Ventricle: The right ventricular size is normal. No increase in right ventricular wall thickness. Right ventricular systolic function is normal. Left Atrium: Left atrial size was normal in size. Right Atrium: Right atrial size was normal in size. Pericardium: There is no evidence of pericardial effusion. Mitral Valve: The mitral valve is normal in structure. Mild mitral valve regurgitation. Tricuspid Valve: The tricuspid valve is normal in structure. Tricuspid valve regurgitation is trivial. Aortic Valve: The aortic valve is tricuspid. Aortic valve regurgitation is not visualized. Aortic valve mean gradient measures 3.0 mmHg. Aortic valve peak gradient measures 5.1 mmHg. Aortic valve area, by VTI measures 2.63 cm. Pulmonic Valve: The pulmonic valve was not well visualized. Pulmonic valve regurgitation is not visualized. Aorta: The aortic root is normal in size and structure. Venous: The inferior vena cava was not well visualized. IAS/Shunts: No atrial level shunt detected by color flow Doppler.  LEFT VENTRICLE PLAX 2D LVIDd:         4.05 cm   Diastology LVIDs:         2.70 cm   LV e'  medial:    6.53 cm/s LV PW:         0.98 cm   LV E/e' medial:  6.9 LV IVS:        1.14 cm  LV e' lateral:   7.94 cm/s LVOT diam:     2.00 cm   LV E/e' lateral: 5.7 LV SV:         55 LV SV Index:   29 LVOT Area:     3.14 cm  RIGHT VENTRICLE RV S prime:     10.10 cm/s TAPSE (M-mode): 1.9 cm LEFT ATRIUM             Index LA diam:        3.10 cm 1.61 cm/m LA Vol (A2C):   37.1 ml 19.30 ml/m LA Vol (A4C):   26.2 ml 13.63 ml/m LA Biplane Vol: 33.5 ml 17.42 ml/m  AORTIC VALVE AV Area (Vmax):    2.77 cm AV Area (Vmean):   2.77 cm AV Area (VTI):     2.63 cm AV Vmax:           113.00 cm/s AV Vmean:          74.900 cm/s AV VTI:            0.210 m AV Peak Grad:      5.1 mmHg AV Mean Grad:      3.0 mmHg LVOT Vmax:         99.60 cm/s LVOT Vmean:        66.000 cm/s LVOT VTI:          0.176 m LVOT/AV VTI ratio: 0.84  AORTA Ao Sinus diam: 3.16 cm Ao Asc diam:   2.90 cm MITRAL VALVE MV Area (PHT): 2.95 cm    SHUNTS MV Decel Time: 257 msec    Systemic VTI:  0.18 m MV E velocity: 45.10 cm/s  Systemic Diam: 2.00 cm MV A velocity: 86.50 cm/s MV E/A ratio:  0.52 Constancia Delton MD Electronically signed by Constancia Delton MD Signature Date/Time: 08/25/2023/12:45:43 PM    Final      Assessment and plan- Patient is a 53 y.o. male with history of kappa light chain multiple myeloma currently in relapse.  He is here for on treatment assessment prior to cycle 15-day 8 of carfilzomib   Counts okay to proceed with cycle 15-day 8 carfilzomib  today.  Since he is going to [facility] next week for his follow-up appointment I am holding off on treatment next week.  I will see him back in 2 weeks for cycle 9-day 1 of carfilzomib .On carfilzomib  Pomalyst  regimen serum free kappa light chain has shown a mild but continuous increase.  Free light chain ratio has fluctuated between 4-12 over the last 7 months.  He will be discussing Options at [facility] including CAR-T cell therapy versus bispecific treatments.  Also discussed with the patient that if  he decides to not do any treatment for his myeloma that could result in uncontrolled rise in his free light chain ratio that can lead to renal failure and I would not recommend back.  Left elbow pain: X-ray showsOsteoarthritis.  He does not wish to speak orthopedics at this time for any joint injections.  He will continue topical treatments including icy hot and without any gel as needed     Visit Diagnosis 1. Encounter for antineoplastic chemotherapy   2. High risk medication use   3. Multiple myeloma in relapse (HCC)   4. Chemotherapy-induced peripheral neuropathy (HCC)      Dr. Seretha Dance, MD, MPH [facility] at [facility] [phone] 09/16/2023 8:37 AM

## 2023-09-16 NOTE — Progress Notes (Signed)
 No appetite, no supplement drinks, having nausea, using compazine .  Xray left elbow, fell 1 month ago.  Pt asking if he can stop chemo for a while and what would happen if he did? Seeing [facility] oncology June 24th.

## 2023-09-23 ENCOUNTER — Other Ambulatory Visit

## 2023-09-23 ENCOUNTER — Ambulatory Visit

## 2023-09-24 ENCOUNTER — Other Ambulatory Visit: Payer: Self-pay | Admitting: Oncology

## 2023-09-24 DIAGNOSIS — M792 Neuralgia and neuritis, unspecified: Secondary | ICD-10-CM

## 2023-09-24 DIAGNOSIS — G620 Drug-induced polyneuropathy: Secondary | ICD-10-CM

## 2023-09-25 ENCOUNTER — Encounter: Payer: Self-pay | Admitting: Oncology

## 2023-09-25 ENCOUNTER — Telehealth: Payer: Self-pay | Admitting: Oncology

## 2023-09-25 NOTE — Telephone Encounter (Signed)
 Patients daughter called to check on his appointments- They want to talk to you about stopping Chemo and next steps. He missed last weeks appointments and wonders if he needs to have them next week.   Ansleigh would like a call back [phone]

## 2023-09-25 NOTE — Telephone Encounter (Signed)
 James House please cancel rx on 6/17. I will just see him on that day James House- can you please find out when his next [facility] appt is? Does he plan to go for that?

## 2023-09-26 ENCOUNTER — Encounter: Payer: Self-pay | Admitting: *Deleted

## 2023-09-26 ENCOUNTER — Encounter: Payer: Self-pay | Admitting: Oncology

## 2023-09-26 ENCOUNTER — Telehealth: Payer: Self-pay

## 2023-09-26 NOTE — Telephone Encounter (Signed)
 Per last telephone note from sister dated 09/25/23 "Patients daughter called to check on his appointments- They want to talk to you about stopping Chemo and next steps. He missed last weeks appointments and wonders if he needs to have them next week. Ansleigh would like a call back [phone]"."  Outbound call to patient to clarify based on a different conversation we had earlier today (1) Does he indeed want to continue treatment or (2) is this strictly a scheduling issue.   Patient states says this treatment is not working , has been on it for a year and he no longer would like to continue with infusions at this time.  Patient will come to appointment and have port labs drawn as scheduled on 10/07/23.  Patient inquired if Dr. Randy Buttery will still manage refills; informed Dr. Randy Buttery would be glad to discuss any details for the plan moving forward at the next scheduled OV.  Patient verbalized agreement.

## 2023-09-26 NOTE — Telephone Encounter (Signed)
 Incoming call from patient; questioned whether his infusion schedule is correct specifically for the date 10/14/23.  Patient says he has an appointment at [facility] that morning at 7:15AM for chemo and he cannot make it to his appointment here that day.  Informed patient I would forward to provider and our scheduling team; after we hear back from Dr. Randy Buttery a member of our scheduling team will be in touch.  Patient verbalized understanding.

## 2023-09-29 ENCOUNTER — Other Ambulatory Visit: Payer: Self-pay | Admitting: Oncology

## 2023-09-29 ENCOUNTER — Other Ambulatory Visit: Payer: Self-pay

## 2023-09-29 ENCOUNTER — Telehealth: Payer: Self-pay

## 2023-09-29 DIAGNOSIS — M792 Neuralgia and neuritis, unspecified: Secondary | ICD-10-CM

## 2023-09-29 MED FILL — Oxycodone HCl Tab 15 MG: ORAL | 30 days supply | Qty: 90 | Fill #0 | Status: AC

## 2023-09-29 NOTE — Telephone Encounter (Signed)
 Outbound call spoke to [facility] who confirmed oxycodone  has been filled and is ready for pickup.

## 2023-09-29 NOTE — Telephone Encounter (Signed)
 Received voicemail from patient on 09/29/23 at 12:29PM requesting a refill for Oxycodone  15mg .  In chart it appears script was sent earlier today but does not show as received.  Outbound call to [facility] to see if script was received, phone line busy. Will try again.

## 2023-09-29 NOTE — Telephone Encounter (Signed)
 Outbound call to patient; informed of below. No questions / concerns at this time.

## 2023-09-30 ENCOUNTER — Ambulatory Visit

## 2023-10-01 ENCOUNTER — Other Ambulatory Visit: Payer: Self-pay | Admitting: Primary Care

## 2023-10-01 DIAGNOSIS — I10 Essential (primary) hypertension: Secondary | ICD-10-CM

## 2023-10-01 DIAGNOSIS — M7918 Myalgia, other site: Secondary | ICD-10-CM

## 2023-10-01 DIAGNOSIS — M62830 Muscle spasm of back: Secondary | ICD-10-CM

## 2023-10-01 NOTE — Telephone Encounter (Signed)
 Unable to reach patient. Left voicemail to return call to our office.

## 2023-10-01 NOTE — Telephone Encounter (Signed)
 Please call patient:  We received refill requests for amlodipine  and baclofen  from [facility]. Is he still using them or did he switch to [facility]?  Also, needs CPE scheduled for mid August

## 2023-10-02 ENCOUNTER — Other Ambulatory Visit: Payer: Self-pay | Admitting: Family

## 2023-10-02 DIAGNOSIS — I10 Essential (primary) hypertension: Secondary | ICD-10-CM

## 2023-10-02 NOTE — Telephone Encounter (Signed)
 Unable to reach patient. Left voicemail to return call to our office.

## 2023-10-03 NOTE — Telephone Encounter (Signed)
Unable to reach patient. Left voicemail to return call to our office.   3rd attempt, mailing letter

## 2023-10-07 ENCOUNTER — Inpatient Hospital Stay: Attending: Internal Medicine | Admitting: Oncology

## 2023-10-07 ENCOUNTER — Inpatient Hospital Stay (HOSPITAL_BASED_OUTPATIENT_CLINIC_OR_DEPARTMENT_OTHER): Admitting: Hospice and Palliative Medicine

## 2023-10-07 ENCOUNTER — Encounter: Payer: Self-pay | Admitting: Oncology

## 2023-10-07 ENCOUNTER — Inpatient Hospital Stay

## 2023-10-07 ENCOUNTER — Ambulatory Visit

## 2023-10-07 VITALS — BP 126/91 | HR 110 | Temp 99.5°F | Resp 20 | Ht 67.0 in | Wt 177.0 lb

## 2023-10-07 DIAGNOSIS — Z515 Encounter for palliative care: Secondary | ICD-10-CM | POA: Diagnosis not present

## 2023-10-07 DIAGNOSIS — Z9484 Stem cells transplant status: Secondary | ICD-10-CM | POA: Insufficient documentation

## 2023-10-07 DIAGNOSIS — Z7189 Other specified counseling: Secondary | ICD-10-CM | POA: Insufficient documentation

## 2023-10-07 DIAGNOSIS — C9002 Multiple myeloma in relapse: Secondary | ICD-10-CM

## 2023-10-07 DIAGNOSIS — Z87891 Personal history of nicotine dependence: Secondary | ICD-10-CM | POA: Diagnosis not present

## 2023-10-07 DIAGNOSIS — G893 Neoplasm related pain (acute) (chronic): Secondary | ICD-10-CM | POA: Diagnosis not present

## 2023-10-07 DIAGNOSIS — Z79899 Other long term (current) drug therapy: Secondary | ICD-10-CM | POA: Insufficient documentation

## 2023-10-07 DIAGNOSIS — Z800 Family history of malignant neoplasm of digestive organs: Secondary | ICD-10-CM | POA: Insufficient documentation

## 2023-10-07 DIAGNOSIS — M792 Neuralgia and neuritis, unspecified: Secondary | ICD-10-CM

## 2023-10-07 LAB — CBC WITH DIFFERENTIAL (CANCER CENTER ONLY)
Abs Immature Granulocytes: 0.05 10*3/uL (ref 0.00–0.07)
Basophils Absolute: 0 10*3/uL (ref 0.0–0.1)
Basophils Relative: 0 %
Eosinophils Absolute: 0.3 10*3/uL (ref 0.0–0.5)
Eosinophils Relative: 4 %
HCT: 39.1 % (ref 39.0–52.0)
Hemoglobin: 13.4 g/dL (ref 13.0–17.0)
Immature Granulocytes: 1 %
Lymphocytes Relative: 14 %
Lymphs Abs: 1 10*3/uL (ref 0.7–4.0)
MCH: 31.8 pg (ref 26.0–34.0)
MCHC: 34.3 g/dL (ref 30.0–36.0)
MCV: 92.7 fL (ref 80.0–100.0)
Monocytes Absolute: 0.8 10*3/uL (ref 0.1–1.0)
Monocytes Relative: 10 %
Neutro Abs: 5.3 10*3/uL (ref 1.7–7.7)
Neutrophils Relative %: 71 %
Platelet Count: 196 10*3/uL (ref 150–400)
RBC: 4.22 MIL/uL (ref 4.22–5.81)
RDW: 14.4 % (ref 11.5–15.5)
WBC Count: 7.4 10*3/uL (ref 4.0–10.5)
nRBC: 0 % (ref 0.0–0.2)

## 2023-10-07 LAB — CMP (CANCER CENTER ONLY)
ALT: 13 U/L (ref 0–44)
AST: 19 U/L (ref 15–41)
Albumin: 2.9 g/dL — ABNORMAL LOW (ref 3.5–5.0)
Alkaline Phosphatase: 67 U/L (ref 38–126)
Anion gap: 9 (ref 5–15)
BUN: 12 mg/dL (ref 6–20)
CO2: 21 mmol/L — ABNORMAL LOW (ref 22–32)
Calcium: 8 mg/dL — ABNORMAL LOW (ref 8.9–10.3)
Chloride: 108 mmol/L (ref 98–111)
Creatinine: 1.32 mg/dL — ABNORMAL HIGH (ref 0.61–1.24)
GFR, Estimated: >60 mL/min (ref >60)
Glucose, Bld: 112 mg/dL — ABNORMAL HIGH (ref 70–99)
Potassium: 3.4 mmol/L — ABNORMAL LOW (ref 3.5–5.1)
Sodium: 138 mmol/L (ref 135–145)
Total Bilirubin: 1.1 mg/dL (ref 0.0–1.2)
Total Protein: 6.2 g/dL — ABNORMAL LOW (ref 6.5–8.1)

## 2023-10-07 MED ORDER — OXYCODONE HCL 15 MG PO TABS: 15.0000 mg | ORAL_TABLET | Freq: Four times a day (QID) | ORAL | Status: DC | PRN

## 2023-10-07 NOTE — Progress Notes (Signed)
 Hematology/Oncology Consult note [facility]  [phone] [phone]  Patient Care Team: Gabriel Jvion, NP as PCP - General (Internal Medicine) Brad Cables, MD as PCP - Hematology/Oncology Constancia Delton, MD as PCP - Cardiology (Cardiology) Avonne Boettcher, MD as Consulting Physician (Hematology and Oncology)   Name of the patient: James House  [id number]  10-Aug-1970   Date of visit: 10/07/23  Diagnosis- recurrent kappa light chain multiple myeloma in relapse     Chief complaint/ Reason for visit-discuss further management of multiple myeloma  Heme/Onc history: Patient is a 53- year-old male with a history of kappa light chain myeloma that was treated by Dr. Mirna Amis.  History is as follows:   1.  Patient presented with renal insufficiency with a creatinine of 2.4 in April 2018.  Kidney biopsy showed myeloma cast nephropathy.  Kappa light chain was 3004 and lambda 0.22 with a kappa lambda ratio of 13,000 655.  Skeletal survey showed multiple calvarial and marrow lesions.  Bone marrow biopsy in May 2018 showed 43% plasma cells.  He received CyBorD for cycle 1 in May 2018 and was subsequently switched to RVD.  He received 4 cycles followed by a repeat bone marrow biopsy in August 2018 which showed no increase in plasma cells.   2.  He underwent autologous stem cell transplantation on 01/30/2017.  He was started on Revlimid  maintenance 10 mg daily in January 2019.   3.  Labs from January February and March 2019 done at [facility] showed no M spike, normal kappa lambda ratio of 1.06.  He was last seen by them in April 2019.   4. Following that patient was admitted to [facility] for healthcare associated pneumonia and was recently discharged.  He wished to transfer his care to us  at this time.  He was on maintenance Revlimid  5 mg which was then reduced to 2.5 mg.  Follows up with pain clinic for chemo-induced peripheral neuropathy with Dr.  Barth Borne.  His urine tested positive for [facility] and therefore patient was discharged from pain clinic   5.  Noted to have gradually increasing free kappa light chainFrom 15 in 20 21-39.5 with a ratio of 3.09.  He was seen by [facility] again and underwent a repeat bone marrow biopsy which did not show any evidence of clonal plasma cells.  PET CT scan also did not show any new active lytic lesions in May 2023..  Revlimid  dose was increased to 15 mg 3 weeks on 1 week off but kappa light chains show continuing to increase   6.  PET CT scan in December 2023 did not show any evidence of active myeloma.  Patient underwent repeat bone marrow biopsy at [facility] which showed 6% plasma cellsConsistent with persistent plasma cell neoplasm.  74 to 86% of isolated plasma cells show loss of T p53 and monosomy 13.  Patient was seen by [name] at [facility] and was recommended Darzalex  Pomalyst  dexamethasone  regimen   7.  Disease progression after 8 weekly cycles of Darazalex kappa light chain increased from 332-646 with a ratio that went up from 39-57 concerning for disease progression.  Patient switched to carfilzomib  Pomalyst  dexamethasone  regimen    Interval history-patient states that he is tired of receiving treatment for his multiple myeloma for over 7 years now.  He is strongly considering not doing any treatment at all for multiple myeloma  ECOG PS- 2 Pain scale- 4 Opioid associated constipation- no  Review of systems- Review of  Systems  Constitutional:  Positive for malaise/fatigue. Negative for chills, fever and weight loss.  HENT:  Negative for congestion, ear discharge and nosebleeds.   Eyes:  Negative for blurred vision.  Respiratory:  Negative for cough, hemoptysis, sputum production, shortness of breath and wheezing.   Cardiovascular:  Negative for chest pain, palpitations, orthopnea and claudication.  Gastrointestinal:  Negative for abdominal pain, blood in stool, constipation, diarrhea, heartburn, melena, nausea  and vomiting.  Genitourinary:  Negative for dysuria, flank pain, frequency, hematuria and urgency.  Musculoskeletal:  Positive for back pain and joint pain. Negative for myalgias.  Skin:  Negative for rash.  Neurological:  Positive for sensory change (Peripheral neuropathy). Negative for dizziness, tingling, focal weakness, seizures, weakness and headaches.  Endo/Heme/Allergies:  Does not bruise/bleed easily.  Psychiatric/Behavioral:  Negative for depression and suicidal ideas. The patient does not have insomnia.       Allergies  Allergen Reactions   Morphine  Nausea And Vomiting     Past Medical History:  Diagnosis Date   CKD (chronic kidney disease) stage 3, GFR 30-59 ml/min (HCC)    Elevated sedimentation rate 12/24/2017   Hypertension    Marijuana use 03/28/2021   ABNORMAL (12/04/2020) UDS (+) Carboxy-THC (Marijuana)     Multiple myeloma (HCC) 08/11/2017   Neuropathy    Pneumonia    Severe sepsis (HCC) 08/11/2017   Shingles 08/03/2019     Past Surgical History:  Procedure Laterality Date   ABDOMINAL SURGERY     COLONOSCOPY WITH PROPOFOL  N/A 05/25/2021   Procedure: COLONOSCOPY WITH PROPOFOL ;  Surgeon: Selena Daily, MD;  Location: [facility] ENDOSCOPY;  Service: Gastroenterology;  Laterality: N/A;   IR IMAGING GUIDED PORT INSERTION  08/02/2022   JOINT REPLACEMENT     right knee   KNEE SURGERY Left     Social History   Socioeconomic History   Marital status: Single    Spouse name: Shelvy Dickens   Number of children: 3   Years of education: Not on file   Highest education level: Not on file  Occupational History   Not on file  Tobacco Use   Smoking status: Former   Smokeless tobacco: Current    Types: Snuff  Vaping Use   Vaping status: Never Used  Substance and Sexual Activity   Alcohol use: No   Drug use: No   Sexual activity: Yes  Other Topics Concern   Not on file  Social History Narrative   Live in private residence with spouse and mother in law   Social  Drivers of Health   Financial Resource Strain: Low Risk  (08/12/2017)   Overall Financial Resource Strain (CARDIA)    Difficulty of Paying Living Expenses: Not hard at all  Food Insecurity: No Food Insecurity (08/12/2017)   Hunger Vital Sign    Worried About Running Out of Food in the Last Year: Never true    Ran Out of Food in the Last Year: Never true  Transportation Needs: No Transportation Needs (08/12/2017)   PRAPARE - Administrator, Civil Service (Medical): No    Lack of Transportation (Non-Medical): No  Physical Activity: Sufficiently Active (08/12/2017)   Exercise Vital Sign    Days of Exercise per Week: 7 days    Minutes of Exercise per Session: 30 min  Stress: No Stress Concern Present (08/12/2017)   [employer] of Occupational Health - Occupational Stress Questionnaire    Feeling of Stress : Only a little  Social Connections: Somewhat Isolated (08/12/2017)   Social  Connection and Isolation Panel    Frequency of Communication with Friends and Family: Once a week    Frequency of Social Gatherings with Friends and Family: Once a week    Attends Religious Services: More than 4 times per year    Active Member of [employer] or Organizations: No    Attends Banker Meetings: Never    Marital Status: Married  Catering manager Violence: Not At Risk (08/12/2017)   Humiliation, Afraid, Rape, and Kick questionnaire    Fear of Current or Ex-Partner: No    Emotionally Abused: No    Physically Abused: No    Sexually Abused: No    Family History  Problem Relation Age of Onset   Heart disease Mother    Cancer Mother 71       Pancreatic   COPD Mother    Diabetes Mother    Hyperlipidemia Mother    Hypertension Mother    Cancer Maternal Uncle        pancreatic   Heart disease Maternal Grandmother    Cancer Maternal Grandmother 49       colon   COPD Maternal Grandmother    Diabetes Maternal Grandmother    Hypertension Maternal Grandmother      Current  Outpatient Medications:    albuterol  (VENTOLIN  HFA) 108 (90 Base) MCG/ACT inhaler, Inhale 2 puffs into the lungs every 6 (six) hours as needed for wheezing or shortness of breath., Disp: 6.7 g, Rfl: 6   amitriptyline  (ELAVIL ) 25 MG tablet, Take 1 tablet (25 mg total) by mouth at bedtime. For headache prevention and sleep, Disp: 90 tablet, Rfl: 1   amLODipine -benazepril  (LOTREL ) 10-40 MG capsule, Take 1 capsule by mouth daily. for blood pressure., Disp: 90 capsule, Rfl: 1   ASPIRIN  EC ADULT LOW DOSE 81 MG tablet, TAKE ONE TABLET (81MG  TOTAL) BY MOUTH DAILY AT 9AM, Disp: 30 tablet, Rfl: 1   baclofen  (LIORESAL ) 10 MG tablet, Take 1 tablet (10 mg total) by mouth 4 (four) times daily as needed for muscle spasms., Disp: 360 tablet, Rfl: 1   carvedilol  (COREG ) 25 MG tablet, Take 1 tablet (25 mg total) by mouth 2 (two) times daily with a meal., Disp: 60 tablet, Rfl: 3   cyanocobalamin  (VITAMIN B12) 1000 MCG/ML injection, Inject 1 mL (1,000 mcg total) into the muscle every 30 (thirty) days., Disp: 1 mL, Rfl: 11   furosemide  (LASIX ) 20 MG tablet, Take 1 tablet (20 mg total) by mouth daily. Per Dr. Randy Buttery take for 4 days then stop., Disp: 30 tablet, Rfl: 0   hydrochlorothiazide  (HYDRODIURIL ) 25 MG tablet, Take 1 tablet (25 mg total) by mouth daily., Disp: 90 tablet, Rfl: 3   POMALYST  2 MG capsule, TAKE 1 CAPSULE BY MOUTH 1 TIME A DAY FOR 21 DAYS ON THEN 7 DAYS OFF, Disp: 21 capsule, Rfl: 0   potassium chloride  SA (KLOR-CON  M) 20 MEQ tablet, TAKE TWO TABLETS ( TOTAL) BY MOUTH DAILY AT 9AM, Disp: 60 tablet, Rfl: 1   pregabalin  (LYRICA ) 300 MG capsule, Take 1 capsule (300 mg total) by mouth 2 (two) times daily., Disp: 180 capsule, Rfl: 11   prochlorperazine  (COMPAZINE ) 10 MG tablet, Take by mouth every 8 (eight) hours as needed., Disp: , Rfl:    acyclovir  (ZOVIRAX ) 400 MG tablet, Take 1 tablet (400 mg total) by mouth 2 (two) times daily. (Patient not taking: Reported on 10/07/2023), Disp: 60 tablet, Rfl: 2    celecoxib  (CELEBREX ) 200 MG capsule, Take 1 capsule (200 mg total)  by mouth daily. (Patient not taking: Reported on 10/07/2023), Disp: 30 capsule, Rfl: 2   DULoxetine  (CYMBALTA ) 60 MG capsule, Take 1 capsule (60 mg total) by mouth daily. (Patient not taking: Reported on 10/07/2023), Disp: 60 capsule, Rfl: 1   lidocaine -prilocaine  (EMLA ) cream, Apply 1 Application topically as needed. (Patient not taking: Reported on 10/07/2023), Disp: 30 g, Rfl: 0   methocarbamol  (ROBAXIN -750) 750 MG tablet, Take 1 tablet (750 mg total) by mouth 3 (three) times daily. (Patient not taking: Reported on 10/07/2023), Disp: 90 tablet, Rfl: 0   OLANZapine  (ZYPREXA ) 10 MG tablet, Take 1 tablet (10 mg total) by mouth at bedtime. To help with nausea prevention. (Patient not taking: Reported on 10/07/2023), Disp: 30 tablet, Rfl: 1   oxyCODONE  (ROXICODONE ) 15 MG immediate release tablet, Take 1 tablet (15 mg total) by mouth every 6 (six) hours as needed for pain., Disp: , Rfl:    pantoprazole  (PROTONIX ) 40 MG tablet, Take 1 tablet (40 mg total) by mouth daily. (Patient not taking: Reported on 10/07/2023), Disp: 30 tablet, Rfl: 0   pomalidomide  (POMALYST ) 2 MG capsule, Take 1 capsule (2 mg total) by mouth daily., Disp: 21 capsule, Rfl: 0  Physical exam:  Vitals:   10/07/23 0848 10/07/23 0853 10/07/23 0915  BP: (!) 133/103 (!) 133/103 (!) 126/91  Pulse: 100 (!) 110   Resp: 19 20   Temp: 99.5 F (37.5 C) 99.5 F (37.5 C)   TempSrc: Tympanic Tympanic   SpO2:  96%   Weight:  177 lb (80.3 kg)   Height: 5' 7 (1.702 m) 5' 7 (1.702 m)    Physical Exam  Cardiovascular:     Rate and Rhythm: Normal rate and regular rhythm.     Heart sounds: Normal heart sounds.  Pulmonary:     Effort: Pulmonary effort is normal.     Breath sounds: Normal breath sounds.  Abdominal:     General: Bowel sounds are normal.   Skin:    General: Skin is warm and dry.   Neurological:     Mental Status: He is alert and oriented to person, place,  and time.      I have personally reviewed labs listed below:    Latest Ref Rng & Units 10/07/2023    8:27 AM  CMP  Glucose 70 - 99 mg/dL 161   BUN 6 - 20 mg/dL 12   Creatinine 0.96 - 1.24 mg/dL 0.45   Sodium 409 - 811 mmol/L 138   Potassium 3.5 - 5.1 mmol/L 3.4   Chloride 98 - 111 mmol/L 108   CO2 22 - 32 mmol/L 21   Calcium 8.9 - 10.3 mg/dL 8.0   Total Protein 6.5 - 8.1 g/dL 6.2   Total Bilirubin 0.0 - 1.2 mg/dL 1.1   Alkaline Phos 38 - 126 U/L 67   AST 15 - 41 U/L 19   ALT 0 - 44 U/L 13       Latest Ref Rng & Units 10/07/2023    8:27 AM  CBC  WBC 4.0 - 10.5 K/uL 7.4   Hemoglobin 13.0 - 17.0 g/dL 91.4   Hematocrit 78.2 - 52.0 % 39.1   Platelets 150 - 400 K/uL 196    I have personally reviewed Radiology images listed below: No images are attached to the encounter.  DG Elbow 2 Views Left Result Date: 09/14/2023 CLINICAL DATA:  Left arm pain. Patient reports fall 1 or 2 months ago. EXAM: LEFT ELBOW - 2 VIEW COMPARISON:  None Available.  FINDINGS: There is no evidence of fracture, dislocation, or joint effusion. There is moderate degenerative spurring. No erosions or focal bone abnormality. Soft tissues are unremarkable. IMPRESSION: Moderate osteoarthritis. No acute fracture or posttraumatic findings. Electronically Signed   By: Chadwick Colonel M.D.   On: 09/14/2023 18:22     Assessment and plan- Patient is a 52 y.o. male with history of kappa light chain multiple myeloma currently in relapse.  He is here for further management of multiple myeloma  Present regimen of treatment for multiple myeloma which includes carfilzomib  plus Pomalyst  has kept his disease stable for the most part over the last 7 months.  Hemoglobin is normal and kidney functions are stable.  Serum free light chain Ratio has fluctuated over the last 6 months but remains between 4-9.  He does not have any overt progression on this regimen but is finding it hard to maintain his quality of life while receiving  treatment.  He is in the process of moving to a trailer home in [place] in about 2 to 3 weeks time.  I discussed the following options moving forward  Continuing present regimen of carfilzomib  plus Pomalyst  Continuing Pomalyst  alone and dropping carfilzomib  altogether Not pursuing any treatment for multiple myeloma but continue to follow-up with oncology every 6 to 8 weeks and keeping an eye on his myeloma lab work Proceeding with best supportive care/hospice  Patient would like to proceed with option to.  He will therefore continue with Pomalyst  2 mg 3 weeks on and 1 week off and we will hold off on giving him carfilzomib  altogether.  He is not presently interested in going back to [facility] for consideration of any treatment options down the line such as bispecific for CAR-T cell therapy.  I am also having him see palliative care today to discuss home palliative support as well as arranging for hospice informational visit at this time.  I will see him back in about 6 to 7 weeks time with repeat labs  Chemo-induced peripheral neuropathy / Neoplasm related pain: Continue as needed oxycodone  along with Cymbalta  and Lyrica    Visit Diagnosis 1. Goals of care, counseling/discussion   2. Multiple myeloma in relapse (HCC)   3. High risk medication use      Dr. Seretha Dance, MD, MPH [facility] [phone] 10/07/2023 12:40 PM

## 2023-10-07 NOTE — Progress Notes (Signed)
 Palliative Medicine [facility] at [facility] [phone] [phone]   Name: James House. Date: 10/07/2023 MRN: [id number]  DOB: [birth year]  Patient Care Team: Gabriel Tilford, NP as PCP - General (Internal Medicine) Brad Cables, MD as PCP - Hematology/Oncology Constancia Delton, MD as PCP - Cardiology (Cardiology) Avonne Boettcher, MD as Consulting Physician (Hematology and Oncology)    REASON FOR CONSULTATION: James Cone. is a 53 y.o. male with multiple medical problems including kappa light chain myeloma status post stem cell transplant in 2018 followed by Rev[facility]  maintenance.  Repeat bone marrow biopsy in 2023 suggested persistent plasma cell neoplasm.  He has history of chronic pain and was previously managed by the pain clinic prior to being discharged due to UDS positive for THC.  Patient has had some difficulty with insomnia.  He was referred to palliative care to address goals and manage ongoing symptoms.  SOCIAL HISTORY:     reports that he has quit smoking. His smokeless tobacco use includes snuff. He reports that he does not drink alcohol and does not use drugs.  Patient is unmarried.  He lives at home with his daughter.  He has another daughter lives nearby.  Patient works in a [employer].  ADVANCE DIRECTIVES:  On file  CODE STATUS: DNR/DNI (DNR order signed on 10/07/2023)  PAST MEDICAL HISTORY: Past Medical History:  Diagnosis Date   CKD (chronic kidney disease) stage 3, GFR 30-59 ml/min (HCC)    Elevated sedimentation rate 12/24/2017   Hypertension    Marijuana use 03/28/2021   ABNORMAL (12/04/2020) UDS (+) Carboxy-THC (Marijuana)     Multiple myeloma (HCC) 08/11/2017   Neuropathy    Pneumonia    Severe sepsis (HCC) 08/11/2017   Shingles 08/03/2019    PAST SURGICAL HISTORY:  Past Surgical History:  Procedure Laterality Date   ABDOMINAL SURGERY     COLONOSCOPY WITH PROPOFOL  N/A 05/25/2021    Procedure: COLONOSCOPY WITH PROPOFOL ;  Surgeon: Selena Daily, MD;  Location: [facility] ENDOSCOPY;  Service: Gastroenterology;  Laterality: N/A;   IR IMAGING GUIDED PORT INSERTION  08/02/2022   JOINT REPLACEMENT     right knee   KNEE SURGERY Left     HEMATOLOGY/ONCOLOGY HISTORY:  Oncology History  Multiple myeloma (HCC)  08/11/2017 Initial Diagnosis   Multiple myeloma (HCC)   10/22/2021 Cancer Staging   Staging form: Plasma Cell Myeloma and Plasma Cell Disorders, AJCC 8th Edition - Clinical stage from 10/22/2021: High-risk cytogenetics: Unknown, LDH: Normal - Signed by Avonne Boettcher, MD on 10/22/2021 Stage prefix: Recurrence Cytogenetics: Unknown   06/04/2022 - 07/23/2022 Chemotherapy   Patient is on Treatment Plan : MYELOMA RELAPSED REFRACTORY Daratumumab  SQ + Pomalidomide  + Dexamethasone  (DaraPd) q28d     07/30/2022 - 09/16/2023 Chemotherapy   Patient is on Treatment Plan : MYELOMA RELAPSED/ REFRACTORY Carfilzomib  D1,8,15 (20/27) + Pomalidomide  + Dexamethasone  (KPd) q28d       ALLERGIES:  is allergic to morphine .  MEDICATIONS:  Current Outpatient Medications  Medication Sig Dispense Refill   acyclovir  (ZOVIRAX ) 400 MG tablet Take 1 tablet (400 mg total) by mouth 2 (two) times daily. (Patient not taking: Reported on 10/07/2023) 60 tablet 2   albuterol  (VENTOLIN  HFA) 108 (90 Base) MCG/ACT inhaler Inhale 2 puffs into the lungs every 6 (six) hours as needed for wheezing or shortness of breath. 6.7 g 6   amitriptyline  (ELAVIL ) 25 MG tablet Take 1 tablet (25 mg total) by mouth at bedtime.  For headache prevention and sleep 90 tablet 1   amLODipine -benazepril  (LOTREL ) 10-40 MG capsule Take 1 capsule by mouth daily. for blood pressure. 90 capsule 1   ASPIRIN  EC ADULT LOW DOSE 81 MG tablet TAKE ONE TABLET (81MG  TOTAL) BY MOUTH DAILY AT 9AM 30 tablet 1   baclofen  (LIORESAL ) 10 MG tablet Take 1 tablet (10 mg total) by mouth 4 (four) times daily as needed for muscle spasms. 360 tablet 1   carvedilol   (COREG ) 25 MG tablet Take 1 tablet (25 mg total) by mouth 2 (two) times daily with a meal. 60 tablet 3   celecoxib  (CELEBREX ) 200 MG capsule Take 1 capsule (200 mg total) by mouth daily. (Patient not taking: Reported on 10/07/2023) 30 capsule 2   cyanocobalamin  (VITAMIN B12) 1000 MCG/ML injection Inject 1 mL (1,000 mcg total) into the muscle every 30 (thirty) days. 1 mL 11   DULoxetine  (CYMBALTA ) 60 MG capsule Take 1 capsule (60 mg total) by mouth daily. (Patient not taking: Reported on 10/07/2023) 60 capsule 1   furosemide  (LASIX ) 20 MG tablet Take 1 tablet (20 mg total) by mouth daily. Per Dr. Randy Buttery take for 4 days then stop. 30 tablet 0   hydrochlorothiazide  (HYDRODIURIL ) 25 MG tablet Take 1 tablet (25 mg total) by mouth daily. 90 tablet 3   lidocaine -prilocaine  (EMLA ) cream Apply 1 Application topically as needed. (Patient not taking: Reported on 10/07/2023) 30 g 0   methocarbamol  (ROBAXIN -750) 750 MG tablet Take 1 tablet (750 mg total) by mouth 3 (three) times daily. (Patient not taking: Reported on 10/07/2023) 90 tablet 0   OLANZapine  (ZYPREXA ) 10 MG tablet Take 1 tablet (10 mg total) by mouth at bedtime. To help with nausea prevention. (Patient not taking: Reported on 10/07/2023) 30 tablet 1   oxyCODONE  (ROXICODONE ) 15 MG immediate release tablet Take 1 tablet (15 mg total) by mouth every 8 (eight) hours as needed for pain. 90 tablet 0   pantoprazole  (PROTONIX ) 40 MG tablet Take 1 tablet (40 mg total) by mouth daily. (Patient not taking: Reported on 10/07/2023) 30 tablet 0   pomalidomide  (POMALYST ) 2 MG capsule Take 1 capsule (2 mg total) by mouth daily. 21 capsule 0   POMALYST  2 MG capsule TAKE 1 CAPSULE BY MOUTH 1 TIME A DAY FOR 21 DAYS ON THEN 7 DAYS OFF 21 capsule 0   potassium chloride  SA (KLOR-CON  M) 20 MEQ tablet TAKE TWO TABLETS ( TOTAL) BY MOUTH DAILY AT 9AM 60 tablet 1   pregabalin  (LYRICA ) 300 MG capsule Take 1 capsule (300 mg total) by mouth 2 (two) times daily. 180 capsule 11    prochlorperazine  (COMPAZINE ) 10 MG tablet Take by mouth every 8 (eight) hours as needed.     No current facility-administered medications for this visit.    VITAL SIGNS: There were no vitals taken for this visit. There were no vitals filed for this visit.  Estimated body mass index is 27.72 kg/m as calculated from the following:   Height as of an earlier encounter on 10/07/23: 5' 7 (1.702 m).   Weight as of an earlier encounter on 10/07/23: 177 lb (80.3 kg).  LABS: CBC:    Component Value Date/Time   WBC 7.4 10/07/2023 0827   WBC 12.6 (H) 09/19/2022 0847   HGB 13.4 10/07/2023 0827   HCT 39.1 10/07/2023 0827   PLT 196 10/07/2023 0827   MCV 92.7 10/07/2023 0827   NEUTROABS 5.3 10/07/2023 0827   LYMPHSABS 1.0 10/07/2023 0827   MONOABS 0.8 10/07/2023 0827  EOSABS 0.3 10/07/2023 0827   BASOSABS 0.0 10/07/2023 0827   Comprehensive Metabolic Panel:    Component Value Date/Time   NA 138 10/07/2023 0827   NA 140 11/26/2017 1307   K 3.4 (L) 10/07/2023 0827   CL 108 10/07/2023 0827   CO2 21 (L) 10/07/2023 0827   BUN 12 10/07/2023 0827   BUN 11 11/26/2017 1307   CREATININE 1.32 (H) 10/07/2023 0827   GLUCOSE 112 (H) 10/07/2023 0827   CALCIUM 8.0 (L) 10/07/2023 0827   AST 19 10/07/2023 0827   ALT 13 10/07/2023 0827   ALKPHOS 67 10/07/2023 0827   BILITOT 1.1 10/07/2023 0827   PROT 6.2 (L) 10/07/2023 0827   PROT 7.0 11/26/2017 1307   ALBUMIN 2.9 (L) 10/07/2023 0827   ALBUMIN 4.3 11/26/2017 1307    RADIOGRAPHIC STUDIES: DG Elbow 2 Views Left Result Date: 09/14/2023 CLINICAL DATA:  Left arm pain. Patient reports fall 1 or 2 months ago. EXAM: LEFT ELBOW - 2 VIEW COMPARISON:  None Available. FINDINGS: There is no evidence of fracture, dislocation, or joint effusion. There is moderate degenerative spurring. No erosions or focal bone abnormality. Soft tissues are unremarkable. IMPRESSION: Moderate osteoarthritis. No acute fracture or posttraumatic findings. Electronically Signed   By:  Chadwick Colonel M.D.   On: 09/14/2023 18:22    PERFORMANCE STATUS (ECOG) : 1 - Symptomatic but completely ambulatory  Review of Systems Unless otherwise noted, a complete review of systems is negative.  Physical Exam General: NAD Pulmonary: Unlabored Extremities: no edema, no joint deformities Skin: no rashes Neurological: Grossly nonfocal  IMPRESSION: Patient was around the [facility] schedule today at Dr. Denese Finn request.  I met with patient and daughter.  Patient is considering discontinuing treatments for multiple myeloma.  We discussed option of hospice and best supportive care at home.  Patient is interested in a hospice informational session.  He says that he will let us  know his final decision.  Symptomatically, patient endorses progressive weakness.  He has had some increased pain in his back and legs and is not finding oxycodone  to fully relieve his symptoms.  We discussed option of increasing oxycodone  to every 6 hours dosing.  Daughter reports some intermittent confusion.  Unclear etiology, although as myeloma progresses and renal function declines, this could worsen symptoms.  Could consider rotating from oxycodone  to hydromorphone .  Discussed CODE STATUS.  Patient states clearly that he would not want to be resuscitated or have his life prolonged artificial machines.  He is in agreement with DNR/DNI.  DNR order signed for patient to take him today.  PLAN: -Continue current scope of treatment -Increase oxycodone  15 mg every 6 hours as needed -Referral for hospice informational session -Referral community palliative care -DNR/DNI -Follow-up telephone visit 3 to 4 weeks  Case and plan discussed with Dr. Randy Buttery  Patient expressed understanding and was in agreement with this plan. He also understands that He can call the clinic at any time with any questions, concerns, or complaints.     Time Total: 15 minutes  Visit consisted of counseling and education dealing with the  complex and emotionally intense issues of symptom management and palliative care in the setting of serious and potentially life-threatening illness.Greater than 50%  of this time was spent counseling and coordinating care related to the above assessment and plan.  Signed by: Gerilyn Kobus, PhD, NP-C

## 2023-10-08 ENCOUNTER — Other Ambulatory Visit: Payer: Self-pay

## 2023-10-08 LAB — KAPPA/LAMBDA LIGHT CHAINS
Kappa free light chain: 80.3 mg/L — ABNORMAL HIGH (ref 3.3–19.4)
Kappa, lambda light chain ratio: 5.62 — ABNORMAL HIGH (ref 0.26–1.65)
Lambda free light chains: 14.3 mg/L (ref 5.7–26.3)

## 2023-10-09 ENCOUNTER — Other Ambulatory Visit: Payer: Self-pay

## 2023-10-09 ENCOUNTER — Other Ambulatory Visit: Payer: Self-pay | Admitting: *Deleted

## 2023-10-09 DIAGNOSIS — M792 Neuralgia and neuritis, unspecified: Secondary | ICD-10-CM

## 2023-10-09 LAB — MULTIPLE MYELOMA PANEL, SERUM
Albumin SerPl Elph-Mcnc: 2.8 g/dL — ABNORMAL LOW (ref 2.9–4.4)
Albumin/Glob SerPl: 1 (ref 0.7–1.7)
Alpha 1: 0.3 g/dL (ref 0.0–0.4)
Alpha2 Glob SerPl Elph-Mcnc: 1.1 g/dL — ABNORMAL HIGH (ref 0.4–1.0)
B-Globulin SerPl Elph-Mcnc: 1 g/dL (ref 0.7–1.3)
Gamma Glob SerPl Elph-Mcnc: 0.5 g/dL (ref 0.4–1.8)
Globulin, Total: 3 g/dL (ref 2.2–3.9)
IgA: 102 mg/dL (ref 90–386)
IgG (Immunoglobin G), Serum: 618 mg/dL (ref 603–1613)
IgM (Immunoglobulin M), Srm: 42 mg/dL (ref 20–172)
Total Protein ELP: 5.8 g/dL — ABNORMAL LOW (ref 6.0–8.5)

## 2023-10-09 MED ORDER — OXYCODONE HCL 15 MG PO TABS
15.0000 mg | ORAL_TABLET | Freq: Four times a day (QID) | ORAL | 0 refills | Status: DC | PRN
Start: 2023-10-09 — End: 2023-12-15
  Filled 2023-10-09: qty 90, 23d supply, fill #0
  Filled 2023-12-02: qty 90, 22d supply, fill #0

## 2023-10-10 ENCOUNTER — Other Ambulatory Visit: Payer: Self-pay | Admitting: Oncology

## 2023-10-10 ENCOUNTER — Other Ambulatory Visit: Payer: Self-pay | Admitting: Primary Care

## 2023-10-10 ENCOUNTER — Ambulatory Visit: Admitting: Nurse Practitioner

## 2023-10-10 DIAGNOSIS — G620 Drug-induced polyneuropathy: Secondary | ICD-10-CM

## 2023-10-10 DIAGNOSIS — M792 Neuralgia and neuritis, unspecified: Secondary | ICD-10-CM

## 2023-10-10 DIAGNOSIS — C9002 Multiple myeloma in relapse: Secondary | ICD-10-CM

## 2023-10-10 MED ORDER — PREGABALIN 300 MG PO CAPS: 300.0000 mg | ORAL_CAPSULE | Freq: Two times a day (BID) | ORAL | 11 refills | Status: AC

## 2023-10-10 NOTE — Telephone Encounter (Signed)
 Copied from CRM [id number]. Topic: Clinical - Medication Refill >> Oct 10, 2023 11:22 AM Jenice Mitts wrote: Medication:  pregabalin  (LYRICA ) 300 MG capsule   Has the patient contacted their pharmacy? Yes (Agent: If no, request that the patient contact the pharmacy for the refill. If patient does not wish to contact the pharmacy document the reason why and proceed with request.) (Agent: If yes, when and what did the pharmacy advise?)  This is the patient's preferred pharmacy:   [facility] [place] [address] [place] [address] Phone: [phone] Fax: [phone]  Is this the correct pharmacy for this prescription? Yes If no, delete pharmacy and type the correct one.   Has the prescription been filled recently? Yes  Is the patient out of the medication? No  Has the patient been seen for an appointment in the last year OR does the patient have an upcoming appointment? Yes  Can we respond through MyChart? Yes  Agent: Please be advised that Rx refills may take up to 3 business days. We ask that you follow-up with your pharmacy.

## 2023-10-10 NOTE — Telephone Encounter (Signed)
 He wanted to proceed with hospice correct? In that case I am not refilling pomalyst 

## 2023-10-13 DIAGNOSIS — G4733 Obstructive sleep apnea (adult) (pediatric): Secondary | ICD-10-CM | POA: Diagnosis not present

## 2023-10-14 ENCOUNTER — Ambulatory Visit

## 2023-10-14 ENCOUNTER — Other Ambulatory Visit

## 2023-10-15 ENCOUNTER — Other Ambulatory Visit: Payer: Self-pay | Admitting: Primary Care

## 2023-10-15 DIAGNOSIS — I10 Essential (primary) hypertension: Secondary | ICD-10-CM

## 2023-10-15 NOTE — Telephone Encounter (Unsigned)
 Copied from CRM [id number]. Topic: Clinical - Medication Refill >> Oct 15, 2023  5:02 PM James House wrote: Medication: carvedilol  carvedilol  (COREG ) 25 MG tablet   Has the patient contacted their pharmacy? Yes- pharmacy calling in  (Agent: If no, request that the patient contact the pharmacy for the refill. If patient does not wish to contact the pharmacy document the reason why and proceed with request.) (Agent: If yes, when and what did the pharmacy advise?)  This is the patient's preferred pharmacy:    [facility] [place] [address] [place] [address] Phone: [phone] Fax: [phone]   Is this the correct pharmacy for this prescription? Yes If no, delete pharmacy and type the correct one.   Has the prescription been filled recently? No  Is the patient out of the medication? Yes  Has the patient been seen for an appointment in the last year OR does the patient have an upcoming appointment? Yes  Can we respond through MyChart? No  Agent: Please be advised that Rx refills may take up to 3 business days. We ask that you follow-up with your pharmacy.

## 2023-10-16 ENCOUNTER — Encounter: Payer: Self-pay | Admitting: Oncology

## 2023-10-16 MED ORDER — CARVEDILOL 25 MG PO TABS: 25.0000 mg | ORAL_TABLET | Freq: Two times a day (BID) | ORAL | 0 refills | Status: AC

## 2023-10-16 NOTE — Telephone Encounter (Signed)
Patient is due for CPE/follow up in mid August, this will be required prior to any further refills.  Please schedule, thank you!   

## 2023-10-16 NOTE — Telephone Encounter (Signed)
 LVM to schedule for CPE in Mid August this is required prior to any further refills.

## 2023-10-20 ENCOUNTER — Other Ambulatory Visit: Payer: Self-pay

## 2023-10-20 ENCOUNTER — Encounter: Payer: Self-pay | Admitting: Oncology

## 2023-10-20 ENCOUNTER — Other Ambulatory Visit: Payer: Self-pay | Admitting: Primary Care

## 2023-10-20 DIAGNOSIS — R0602 Shortness of breath: Secondary | ICD-10-CM

## 2023-10-20 MED ORDER — ASPIRIN 81 MG PO TBEC: 81.0000 mg | DELAYED_RELEASE_TABLET | Freq: Once | ORAL | 1 refills | Status: DC

## 2023-10-20 MED ORDER — ALBUTEROL SULFATE HFA 108 (90 BASE) MCG/ACT IN AERS: 2.0000 | INHALATION_SPRAY | Freq: Four times a day (QID) | RESPIRATORY_TRACT | 6 refills | Status: DC | PRN

## 2023-10-20 MED ORDER — LIDOCAINE-PRILOCAINE 2.5-2.5 % EX CREA: 1.0000 | TOPICAL_CREAM | CUTANEOUS | 0 refills | Status: AC | PRN

## 2023-10-20 MED ORDER — OXYCODONE HCL 15 MG PO TABS
15.0000 mg | ORAL_TABLET | ORAL | 0 refills | Status: AC | PRN
  Filled 2023-10-20: qty 90, 12d supply, fill #0

## 2023-10-20 MED ORDER — CYANOCOBALAMIN 1000 MCG/ML IJ SOLN: 1000.0000 ug | INTRAMUSCULAR | 11 refills | Status: AC

## 2023-10-20 MED ORDER — AMITRIPTYLINE HCL 25 MG PO TABS
25.0000 mg | ORAL_TABLET | Freq: Every day | ORAL | 2 refills | Status: DC
  Filled 2023-10-20 (×2): qty 30, 30d supply, fill #0
  Filled 2023-11-19: qty 30, 30d supply, fill #1
  Filled 2024-01-13: qty 30, 30d supply, fill #2
  Filled ????-??-??: fill #0

## 2023-10-20 MED ORDER — PREGABALIN 300 MG PO CAPS
300.0000 mg | ORAL_CAPSULE | Freq: Two times a day (BID) | ORAL | 2 refills | Status: AC
  Filled 2023-10-20: qty 60, 30d supply, fill #0

## 2023-10-20 NOTE — Telephone Encounter (Signed)
 Lvmtcb, sent mychart message

## 2023-10-20 NOTE — Telephone Encounter (Signed)
 Copied from CRM [id number]. Topic: Clinical - Medication Refill >> Oct 20, 2023  1:29 PM James House wrote: Medication: pregabalin  (LYRICA ) 300 MG capsule albuterol  (VENTOLIN  HFA) 108 (90 Base) MCG/ACT inhaler cyanocobalamin  (VITAMIN B12) 1000 MCG/ML injection ASPIRIN  EC ADULT LOW DOSE 81 MG tablet lidocaine -prilocaine  (EMLA ) cream  Has the patient contacted their pharmacy? Yes (Agent: If no, request that the patient contact the pharmacy for the refill. If patient does not wish to contact the pharmacy document the reason why and proceed with request.) (Agent: If yes, when and what did the pharmacy advise?)  This is the patient's preferred pharmacy:   [facility] [place] [address] [place] [address] Phone: [phone] Fax: [phone]  Is this the correct pharmacy for this prescription? Yes If no, delete pharmacy and type the correct one.   Has the prescription been filled recently? No  Is the patient out of the medication? Yes  Has the patient been seen for an appointment in the last year OR does the patient have an upcoming appointment? Yes  Can we respond through MyChart? Yes  Agent: Please be advised that Rx refills may take up to 3 business days. We ask that you follow-up with your pharmacy.

## 2023-10-21 ENCOUNTER — Other Ambulatory Visit

## 2023-10-21 ENCOUNTER — Ambulatory Visit: Admitting: Oncology

## 2023-10-21 ENCOUNTER — Ambulatory Visit

## 2023-10-23 ENCOUNTER — Telehealth: Payer: Self-pay | Admitting: Primary Care

## 2023-10-23 ENCOUNTER — Other Ambulatory Visit: Payer: Self-pay | Admitting: Family

## 2023-10-23 ENCOUNTER — Other Ambulatory Visit: Payer: Self-pay | Admitting: Primary Care

## 2023-10-23 DIAGNOSIS — I10 Essential (primary) hypertension: Secondary | ICD-10-CM

## 2023-10-23 DIAGNOSIS — M7918 Myalgia, other site: Secondary | ICD-10-CM

## 2023-10-23 DIAGNOSIS — M62830 Muscle spasm of back: Secondary | ICD-10-CM

## 2023-10-23 NOTE — Telephone Encounter (Signed)
Patient is due for CPE/follow up in mid August, this will be required prior to any further refills.  Please schedule, thank you!   

## 2023-10-23 NOTE — Telephone Encounter (Signed)
 Please call patient:  Please thank him for the update, I did not know he was on hospice.  Is he following with the hospice doctor now?

## 2023-10-23 NOTE — Telephone Encounter (Signed)
 Added to appropriate encounter.

## 2023-10-23 NOTE — Telephone Encounter (Signed)
 Unable to reach patient. Left voicemail to return call to our office.

## 2023-10-23 NOTE — Telephone Encounter (Signed)
 Copied from CRM [id number]. Topic: Appointments - Scheduling Inquiry for Clinic >> Oct 23, 2023  9:38 AM [name] wrote: Reason for CRM: Patient is on Hospice and states that he is denying the requested appointment for katherine clarke.

## 2023-10-23 NOTE — Telephone Encounter (Signed)
 Lvmtcb, sent mychart message

## 2023-10-23 NOTE — Telephone Encounter (Unsigned)
 Copied from CRM [id number]. Topic: Appointments - Scheduling Inquiry for Clinic >> Oct 23, 2023  9:38 AM [name] wrote: Reason for CRM: Patient is on Hospice and states that he is denying the requested appointment for James House.

## 2023-10-27 ENCOUNTER — Other Ambulatory Visit: Payer: Self-pay | Admitting: Primary Care

## 2023-10-27 NOTE — Telephone Encounter (Unsigned)
 Copied from CRM [id number]. Topic: Clinical - Medication Refill >> Oct 27, 2023  2:42 PM Henretta I wrote: Medication:  pregabalin  (LYRICA ) 300 MG capsule   Has the patient contacted their pharmacy? Yes (Agent: If no, request that the patient contact the pharmacy for the refill. If patient does not wish to contact the pharmacy document the reason why and proceed with request.) (Agent: If yes, when and what did the pharmacy advise?)  This is the patient's preferred pharmacy:  [facility] [place] [address] [place] [address] Phone: [phone] Fax: [phone]  Is this the correct pharmacy for this prescription? Yes If no, delete pharmacy and type the correct one.   Has the prescription been filled recently? No  Is the patient out of the medication? N/A, pharm called in for patient   Has the patient been seen for an appointment in the last year OR does the patient have an upcoming appointment? Yes  Can we respond through MyChart? No  Agent: Please be advised that Rx refills may take up to 3 business days. We ask that you follow-up with your pharmacy.

## 2023-10-27 NOTE — Telephone Encounter (Signed)
 Lvm

## 2023-10-28 NOTE — Telephone Encounter (Signed)
 Called and spoke with patient, he states his care is being followed by the hospice provider.

## 2023-10-28 NOTE — Telephone Encounter (Signed)
 Noted

## 2023-10-31 ENCOUNTER — Telehealth: Payer: Self-pay

## 2023-10-31 NOTE — Telephone Encounter (Signed)
 Medication not prescribed by our office. Advised [facility] to reach out to prescribers office.

## 2023-10-31 NOTE — Telephone Encounter (Signed)
 Copied from CRM [id number]. Topic: Clinical - Prescription Issue >> Oct 31, 2023  1:53 PM Geroldine GRADE wrote: Reason for CRM: Brandy with [facility] is calling because for the lidocaine -prilocaine  (EMLA ) cream, they need a new prescription sent over with the frequency and how many times daily. She just needs the directions clarified

## 2023-11-02 ENCOUNTER — Other Ambulatory Visit: Payer: Self-pay | Admitting: Oncology

## 2023-11-02 DIAGNOSIS — R0602 Shortness of breath: Secondary | ICD-10-CM

## 2023-11-03 ENCOUNTER — Inpatient Hospital Stay: Attending: Internal Medicine | Admitting: Hospice and Palliative Medicine

## 2023-11-03 ENCOUNTER — Other Ambulatory Visit: Payer: Self-pay

## 2023-11-03 ENCOUNTER — Other Ambulatory Visit: Payer: Self-pay | Admitting: Oncology

## 2023-11-03 DIAGNOSIS — C9002 Multiple myeloma in relapse: Secondary | ICD-10-CM

## 2023-11-03 MED ORDER — OXYCODONE HCL 15 MG PO TABS
15.0000 mg | ORAL_TABLET | ORAL | 0 refills | Status: AC | PRN
  Filled 2023-11-03: qty 90, 12d supply, fill #0

## 2023-11-03 NOTE — Progress Notes (Signed)
 Voicemail left for patient.  I did call and speak with his hospice nurse, Tiffany.  Patient was started on hospice on 10/11/2023.  Reportedly, patient has had slow decline over the past couple of weeks with increased weakness.  Pain is reportedly stable and refill was recently provided by hospice physician.  Patient is For clinic follow-up in 3 weeks.  However, unclear if patient will want to come as he is now enrolled on hospice.

## 2023-11-04 ENCOUNTER — Encounter: Payer: Self-pay | Admitting: Oncology

## 2023-11-04 ENCOUNTER — Encounter: Admitting: Pulmonary Disease

## 2023-11-04 ENCOUNTER — Encounter: Payer: Self-pay | Admitting: Pulmonary Disease

## 2023-11-13 ENCOUNTER — Other Ambulatory Visit: Payer: Self-pay

## 2023-11-18 ENCOUNTER — Other Ambulatory Visit: Payer: Self-pay

## 2023-11-18 MED ORDER — OXYCODONE HCL 15 MG PO TABS
15.0000 mg | ORAL_TABLET | ORAL | 0 refills | Status: AC | PRN
  Filled 2023-11-18: qty 90, 12d supply, fill #0

## 2023-11-19 ENCOUNTER — Telehealth: Payer: Self-pay | Admitting: *Deleted

## 2023-11-19 ENCOUNTER — Other Ambulatory Visit: Payer: Self-pay

## 2023-11-19 NOTE — Telephone Encounter (Signed)
 Called patient and left message that James Mower, NP stated it was good either way to come in for appointment or cancel.  Patient decision since he is being followed by Hospice.

## 2023-11-19 NOTE — Telephone Encounter (Signed)
 Returned call to patient who states he has appointment with Dr Melanee on 11/25/23 and is unsure if he needs to come.  Patient states he is now under Hospice care at home.

## 2023-11-25 ENCOUNTER — Encounter: Admitting: Hospice and Palliative Medicine

## 2023-11-25 ENCOUNTER — Inpatient Hospital Stay

## 2023-11-25 ENCOUNTER — Inpatient Hospital Stay: Admitting: Oncology

## 2023-11-26 ENCOUNTER — Other Ambulatory Visit: Payer: Self-pay | Admitting: Oncology

## 2023-12-02 ENCOUNTER — Other Ambulatory Visit: Payer: Self-pay

## 2023-12-02 MED ORDER — METHADONE HCL 5 MG PO TABS
5.0000 mg | ORAL_TABLET | Freq: Two times a day (BID) | ORAL | 0 refills | Status: DC
  Filled 2023-12-02: qty 25, 12d supply, fill #0
  Filled 2023-12-02: qty 3, 2d supply, fill #0

## 2023-12-03 ENCOUNTER — Other Ambulatory Visit: Payer: Self-pay

## 2023-12-11 ENCOUNTER — Telehealth: Payer: Self-pay | Admitting: Primary Care

## 2023-12-11 NOTE — Telephone Encounter (Unsigned)
 Copied from CRM [id number]. Topic: Clinical - Medication Refill >> Dec 11, 2023  4:54 PM Sophia H wrote: Medication: aspirin  EC (ASPIRIN  EC ADULT LOW DOSE) 81 MG tablet   Has the patient contacted their pharmacy? Yes, pharmacy calling on behalf of patient. Needing updated RX from provider.   This is the patient's preferred pharmacy:   [facility] [place] [address] [place] [address] Phone: [phone] Fax: [phone]   Is this the correct pharmacy for this prescription? Yes If no, delete pharmacy and type the correct one.   Has the prescription been filled recently? Yes  Is the patient out of the medication? Yes  Has the patient been seen for an appointment in the last year OR does the patient have an upcoming appointment? Yes, seen 3/12 with a different provider.   Can we respond through MyChart? Yes  Agent: Please be advised that Rx refills may take up to 3 business days. We ask that you follow-up with your pharmacy.

## 2023-12-12 ENCOUNTER — Encounter: Payer: Self-pay | Admitting: Oncology

## 2023-12-12 NOTE — Telephone Encounter (Signed)
 This was sent in today by patients oncologist.

## 2023-12-15 ENCOUNTER — Other Ambulatory Visit: Payer: Self-pay

## 2023-12-15 MED ORDER — OXYCODONE HCL 15 MG PO TABS
15.0000 mg | ORAL_TABLET | ORAL | 0 refills | Status: DC | PRN
Start: 2023-12-15 — End: 2023-12-30
  Filled 2023-12-15: qty 90, 12d supply, fill #0
  Filled 2023-12-15: qty 80, 10d supply, fill #0
  Filled 2023-12-15: qty 10, 2d supply, fill #0

## 2023-12-18 NOTE — Telephone Encounter (Unsigned)
 Copied from CRM [id number]. Topic: Clinical - Medication Refill >> Dec 18, 2023  5:14 PM Harlene ORN wrote: Medication: ASPIRIN  EC ADULT LOW DOSE 81 MG tablet  Has the patient contacted their pharmacy? Yes (Agent: If no, request that the patient contact the pharmacy for the refill. If patient does not wish to contact the pharmacy document the reason why and proceed with request.) (Agent: If yes, when and what did the pharmacy advise?)  This is the patient's preferred pharmacy:   [facility] [place] [address] [place] [address] Phone: [phone] Fax: [phone]   Is this the correct pharmacy for this prescription? Yes If no, delete pharmacy and type the correct one.   Has the prescription been filled recently? No  Is the patient out of the medication? Yes  Has the patient been seen for an appointment in the last year OR does the patient have an upcoming appointment? Yes  Can we respond through MyChart? No  Agent: Please be advised that Rx refills may take up to 3 business days. We ask that you follow-up with your pharmacy.

## 2023-12-24 ENCOUNTER — Other Ambulatory Visit: Payer: Self-pay

## 2023-12-24 MED ORDER — METHADONE HCL 5 MG PO TABS
5.0000 mg | ORAL_TABLET | Freq: Two times a day (BID) | ORAL | 0 refills | Status: DC
  Filled 2023-12-24: qty 28, 14d supply, fill #0

## 2023-12-29 ENCOUNTER — Encounter: Payer: Self-pay | Admitting: Oncology

## 2023-12-29 ENCOUNTER — Other Ambulatory Visit: Payer: Self-pay

## 2023-12-29 MED ORDER — ONDANSETRON HCL 8 MG PO TABS
8.0000 mg | ORAL_TABLET | Freq: Three times a day (TID) | ORAL | 2 refills | Status: AC | PRN
  Filled 2023-12-29: qty 25, 9d supply, fill #0

## 2023-12-30 ENCOUNTER — Other Ambulatory Visit: Payer: Self-pay

## 2023-12-30 MED ORDER — OXYCODONE HCL 15 MG PO TABS
15.0000 mg | ORAL_TABLET | ORAL | 0 refills | Status: DC | PRN
  Filled 2023-12-30: qty 90, 12d supply, fill #0

## 2023-12-31 ENCOUNTER — Other Ambulatory Visit: Payer: Self-pay | Admitting: Primary Care

## 2023-12-31 DIAGNOSIS — I10 Essential (primary) hypertension: Secondary | ICD-10-CM

## 2023-12-31 DIAGNOSIS — M62830 Muscle spasm of back: Secondary | ICD-10-CM

## 2023-12-31 DIAGNOSIS — G8929 Other chronic pain: Secondary | ICD-10-CM

## 2024-01-02 ENCOUNTER — Other Ambulatory Visit: Payer: Self-pay

## 2024-01-06 ENCOUNTER — Other Ambulatory Visit: Payer: Self-pay

## 2024-01-06 MED ORDER — METHADONE HCL 5 MG PO TABS
5.0000 mg | ORAL_TABLET | Freq: Two times a day (BID) | ORAL | 0 refills | Status: DC
  Filled 2024-01-06: qty 28, 14d supply, fill #0

## 2024-01-08 ENCOUNTER — Other Ambulatory Visit: Payer: Self-pay | Admitting: Primary Care

## 2024-01-08 DIAGNOSIS — M62830 Muscle spasm of back: Secondary | ICD-10-CM

## 2024-01-08 DIAGNOSIS — I10 Essential (primary) hypertension: Secondary | ICD-10-CM

## 2024-01-08 DIAGNOSIS — M7918 Myalgia, other site: Secondary | ICD-10-CM

## 2024-01-08 NOTE — Telephone Encounter (Signed)
 Copied from CRM [id number]. Topic: General - Call Back - No Documentation >> Jan 08, 2024  4:39 PM DeAngela L wrote: Reason for CRM: patient returning call for [facility]. She left a vm for patient call back   Pt num [phone] (M)

## 2024-01-08 NOTE — Telephone Encounter (Signed)
 Unable to reach patient. Left voicemail to return call to our office.

## 2024-01-08 NOTE — Telephone Encounter (Signed)
 Copied from CRM #[id number]. Topic: Clinical - Medication Refill >> Jan 08, 2024  9:52 AM Franky GRADE wrote: Medication: baclofen  (LIORESAL ) 10 MG tablet [[id number]], amLODipine -benazepril  (LOTREL ) 10-40 MG capsule [[id number]],$MzfnczAzqnmzIZPI_YYvPSgMwlxilLigDlCyGUAVDxxhOQtfL$$MzfnczAzqnmzIZPI_YYvPSgMwlxilLigDlCyGUAVDxxhOQtfL$  (COREG ) 25 MG tablet [[id number]]  Has the patient contacted their pharmacy? Yes, Pharmacy is calling to place the request.  (Agent: If no, request that the patient contact the pharmacy for the refill. If patient does not wish to contact the pharmacy document the reason why and proceed with request.) (Agent: If yes, when and what did the pharmacy advise?)  This is the patient's preferred pharmacy:    [facility] [place] [address] [place] [address] Phone: [phone] Fax: [phone]   Is this the correct pharmacy for this prescription? Yes If no, delete pharmacy and type the correct one.   Has the prescription been filled recently? No  Is the patient out of the medication? No  Has the patient been seen for an appointment in the last year OR does the patient have an upcoming appointment? Yes  Can we respond through MyChart? Yes  Agent: Please be advised that Rx refills may take up to 3 business days. We ask that you follow-up with your pharmacy.

## 2024-01-08 NOTE — Telephone Encounter (Signed)
 Patient is now on hospice and follows with hospice doctor, right? Hospice doctor refills medications?

## 2024-01-09 NOTE — Telephone Encounter (Signed)
 Unable to reach patient. Left voicemail to return call to our office.

## 2024-01-12 NOTE — Telephone Encounter (Unsigned)
 Copied from CRM [id number]. Topic: General - Other >> Jan 12, 2024 10:59 AM James House wrote: Reason for CRM: pt calling,he had a missed call from [facility] , he is still under hospice care and to answer Candise Crabtree's questions  he is following hte hospice doctor  and the hospice doctore does refill his medications. Pt callback # is [phone]

## 2024-01-12 NOTE — Telephone Encounter (Signed)
 Unable to reach patient. Left voicemail to return call to our office.   If patient calls back need to relay Meredith message below: and ask questions: Patient is now on hospice and follows with hospice doctor, right? Hospice doctor refills medications?

## 2024-01-12 NOTE — Telephone Encounter (Signed)
 Noted

## 2024-01-16 ENCOUNTER — Other Ambulatory Visit: Payer: Self-pay

## 2024-01-16 MED ORDER — METHADONE HCL 5 MG PO TABS
5.0000 mg | ORAL_TABLET | Freq: Two times a day (BID) | ORAL | 0 refills | Status: DC
  Filled 2024-01-16 – 2024-01-19 (×2): qty 30, 15d supply, fill #0

## 2024-01-19 ENCOUNTER — Other Ambulatory Visit: Payer: Self-pay

## 2024-01-20 ENCOUNTER — Other Ambulatory Visit: Payer: Self-pay

## 2024-01-20 MED ORDER — OXYCODONE HCL 15 MG PO TABS
15.0000 mg | ORAL_TABLET | ORAL | 0 refills | Status: DC | PRN
  Filled 2024-01-20: qty 90, 12d supply, fill #0

## 2024-01-22 ENCOUNTER — Other Ambulatory Visit: Payer: Self-pay | Admitting: Primary Care

## 2024-01-22 ENCOUNTER — Telehealth: Payer: Self-pay | Admitting: Primary Care

## 2024-01-22 ENCOUNTER — Other Ambulatory Visit: Payer: Self-pay | Admitting: Oncology

## 2024-01-22 DIAGNOSIS — M62830 Muscle spasm of back: Secondary | ICD-10-CM

## 2024-01-22 DIAGNOSIS — I10 Essential (primary) hypertension: Secondary | ICD-10-CM

## 2024-01-22 DIAGNOSIS — M7918 Myalgia, other site: Secondary | ICD-10-CM

## 2024-01-22 NOTE — Telephone Encounter (Unsigned)
 Copied from CRM [id number]. Topic: Clinical - Medication Refill >> Jan 22, 2024  5:32 PM Alfonso HERO wrote: Medication: baclofen  (LIORESAL ) 10 MG tablet  amLODipine -benazepril  (LOTREL ) 10-40 MG capsule   carvedilol  (COREG ) 25 MG tablet   Has the patient contacted their pharmacy? Yes (Agent: If no, request that the patient contact the pharmacy for the refill. If patient does not wish to contact the pharmacy document the reason why and proceed with request.) (Agent: If yes, when and what did the pharmacy advise?)  This is the patient's preferred pharmacy:    [facility] [place] - [address] [place] [address] Phone: [phone] Fax: [phone]    Is this the correct pharmacy for this prescription? Yes If no, delete pharmacy and type the correct one.   Has the prescription been filled recently? Yes  Is the patient out of the medication? Yes  Has the patient been seen for an appointment in the last year OR does the patient have an upcoming appointment? Yes  Can we respond through MyChart? Yes  Agent: Please be advised that Rx refills may take up to 3 business days. We ask that you follow-up with your pharmacy.

## 2024-01-23 ENCOUNTER — Encounter: Payer: Self-pay | Admitting: Oncology

## 2024-01-29 ENCOUNTER — Other Ambulatory Visit: Payer: Self-pay | Admitting: Primary Care

## 2024-01-29 DIAGNOSIS — G8929 Other chronic pain: Secondary | ICD-10-CM

## 2024-01-29 DIAGNOSIS — M62830 Muscle spasm of back: Secondary | ICD-10-CM

## 2024-01-29 DIAGNOSIS — I10 Essential (primary) hypertension: Secondary | ICD-10-CM

## 2024-01-29 NOTE — Telephone Encounter (Unsigned)
 Copied from CRM #[id number]. Topic: Clinical - Medication Refill >> Jan 29, 2024 11:29 AM [place] J wrote: Medication:  carvedilol  (COREG ) 25 MG tablet baclofen  (LIORESAL ) 10 MG tablet amLODipine -benazepril  (LOTREL ) 10-40 MG capsule  Has the patient contacted their pharmacy? Yes (Agent: If no, request that the patient contact the pharmacy for the refill. If patient does not wish to contact the pharmacy document the reason why and proceed with request.) (Agent: If yes, when and what did the pharmacy advise?) PHARMACY IS CALLING IN FOR REFILL. This is the patient's preferred pharmacy:  [facility] - [place] - [address] [place] [address] Phone: [phone] Fax: [phone]  [facility] - [place] - [address] [place] [address] Phone: [phone] Fax: [phone]  Is this the correct pharmacy for this prescription? Yes If no, delete pharmacy and type the correct one.   Has the prescription been filled recently? No  Is the patient out of the medication? Yes  Has the patient been seen for an appointment in the last year OR does the patient have an upcoming appointment? Yes  Can we respond through MyChart? Yes  Agent: Please be advised that Rx refills may take up to 3 business days. We ask that you follow-up with your pharmacy.

## 2024-01-29 NOTE — Telephone Encounter (Signed)
 Prescription refills need to be sent to hospice doctor as he is no longer following with us .

## 2024-01-29 NOTE — Telephone Encounter (Signed)
 LVM per DPR advising patient of Meredith message.

## 2024-02-03 ENCOUNTER — Other Ambulatory Visit: Payer: Self-pay

## 2024-02-03 MED ORDER — METHADONE HCL 5 MG PO TABS
5.0000 mg | ORAL_TABLET | Freq: Two times a day (BID) | ORAL | 0 refills | Status: DC
  Filled 2024-02-03: qty 30, 15d supply, fill #0

## 2024-02-03 MED ORDER — OXYCODONE HCL 15 MG PO TABS
15.0000 mg | ORAL_TABLET | ORAL | 0 refills | Status: DC | PRN
  Filled 2024-02-03: qty 90, 12d supply, fill #0

## 2024-02-18 ENCOUNTER — Other Ambulatory Visit: Payer: Self-pay

## 2024-02-18 MED ORDER — METHADONE HCL 5 MG PO TABS
5.0000 mg | ORAL_TABLET | Freq: Two times a day (BID) | ORAL | 0 refills | Status: DC
  Filled 2024-02-18: qty 30, 15d supply, fill #0

## 2024-02-18 MED ORDER — OXYCODONE HCL 15 MG PO TABS
15.0000 mg | ORAL_TABLET | ORAL | 0 refills | Status: DC | PRN
  Filled 2024-02-18: qty 90, 12d supply, fill #0

## 2024-02-20 ENCOUNTER — Encounter: Payer: Self-pay | Admitting: Oncology

## 2024-02-20 ENCOUNTER — Other Ambulatory Visit: Payer: Self-pay | Admitting: Primary Care

## 2024-02-20 DIAGNOSIS — I10 Essential (primary) hypertension: Secondary | ICD-10-CM

## 2024-02-20 DIAGNOSIS — M7918 Myalgia, other site: Secondary | ICD-10-CM

## 2024-02-20 DIAGNOSIS — M62830 Muscle spasm of back: Secondary | ICD-10-CM

## 2024-02-20 NOTE — Telephone Encounter (Signed)
 Encounter opened in error.

## 2024-02-25 ENCOUNTER — Other Ambulatory Visit: Payer: Self-pay

## 2024-02-25 ENCOUNTER — Other Ambulatory Visit (HOSPITAL_BASED_OUTPATIENT_CLINIC_OR_DEPARTMENT_OTHER): Payer: Self-pay

## 2024-02-25 MED ORDER — AMITRIPTYLINE HCL 25 MG PO TABS
25.0000 mg | ORAL_TABLET | Freq: Every day | ORAL | 2 refills | Status: DC
  Filled 2024-02-25 (×2): qty 30, 30d supply, fill #0

## 2024-02-26 ENCOUNTER — Other Ambulatory Visit: Payer: Self-pay

## 2024-03-03 ENCOUNTER — Other Ambulatory Visit: Payer: Self-pay

## 2024-03-03 MED ORDER — OXYCODONE HCL 15 MG PO TABS
15.0000 mg | ORAL_TABLET | ORAL | 0 refills | Status: DC | PRN
  Filled 2024-03-03: qty 90, 12d supply, fill #0

## 2024-03-03 MED ORDER — METHADONE HCL 5 MG PO TABS
5.0000 mg | ORAL_TABLET | Freq: Two times a day (BID) | ORAL | 0 refills | Status: DC
  Filled 2024-03-03: qty 30, 15d supply, fill #0

## 2024-03-09 ENCOUNTER — Other Ambulatory Visit: Payer: Self-pay

## 2024-03-12 ENCOUNTER — Other Ambulatory Visit: Payer: Self-pay

## 2024-03-17 ENCOUNTER — Other Ambulatory Visit: Payer: Self-pay

## 2024-03-17 ENCOUNTER — Encounter: Payer: Self-pay | Admitting: Oncology

## 2024-03-17 MED ORDER — OXYCODONE HCL 15 MG PO TABS
15.0000 mg | ORAL_TABLET | ORAL | 0 refills | Status: DC | PRN
  Filled 2024-03-17: qty 120, 15d supply, fill #0

## 2024-03-17 MED ORDER — METHADONE HCL 5 MG PO TABS
5.0000 mg | ORAL_TABLET | Freq: Two times a day (BID) | ORAL | 0 refills | Status: DC
  Filled 2024-03-17: qty 30, 15d supply, fill #0

## 2024-03-31 ENCOUNTER — Other Ambulatory Visit: Payer: Self-pay

## 2024-03-31 MED ORDER — PREGABALIN 300 MG PO CAPS
300.0000 mg | ORAL_CAPSULE | Freq: Two times a day (BID) | ORAL | 2 refills | Status: AC
  Filled 2024-03-31 (×2): qty 60, 30d supply, fill #0
  Filled 2024-05-05: qty 60, 30d supply, fill #1

## 2024-03-31 MED ORDER — METHADONE HCL 5 MG PO TABS
5.0000 mg | ORAL_TABLET | Freq: Two times a day (BID) | ORAL | 0 refills | Status: DC
  Filled 2024-03-31: qty 30, 15d supply, fill #0

## 2024-03-31 MED ORDER — AMITRIPTYLINE HCL 25 MG PO TABS
25.0000 mg | ORAL_TABLET | Freq: Every day | ORAL | 2 refills | Status: AC
  Filled 2024-03-31: qty 30, 30d supply, fill #0
  Filled 2024-05-05: qty 30, 30d supply, fill #1

## 2024-04-01 ENCOUNTER — Other Ambulatory Visit: Payer: Self-pay

## 2024-04-07 ENCOUNTER — Other Ambulatory Visit: Payer: Self-pay

## 2024-04-07 MED ORDER — OXYCODONE HCL 15 MG PO TABS
15.0000 mg | ORAL_TABLET | ORAL | 0 refills | Status: DC | PRN
  Filled 2024-04-07: qty 90, 12d supply, fill #0

## 2024-04-08 ENCOUNTER — Other Ambulatory Visit: Payer: Self-pay

## 2024-04-12 ENCOUNTER — Encounter: Payer: Self-pay | Admitting: Oncology

## 2024-04-12 ENCOUNTER — Other Ambulatory Visit: Payer: Self-pay

## 2024-04-12 MED ORDER — METHADONE HCL 5 MG PO TABS
5.0000 mg | ORAL_TABLET | Freq: Three times a day (TID) | ORAL | 0 refills | Status: DC
  Filled 2024-04-12: qty 45, 15d supply, fill #0

## 2024-04-16 ENCOUNTER — Other Ambulatory Visit (HOSPITAL_BASED_OUTPATIENT_CLINIC_OR_DEPARTMENT_OTHER): Payer: Self-pay

## 2024-04-21 ENCOUNTER — Other Ambulatory Visit: Payer: Self-pay

## 2024-04-21 MED ORDER — OXYCODONE HCL 15 MG PO TABS
15.0000 mg | ORAL_TABLET | ORAL | 0 refills | Status: DC | PRN
  Filled 2024-04-21: qty 90, 12d supply, fill #0

## 2024-05-05 ENCOUNTER — Other Ambulatory Visit: Payer: Self-pay

## 2024-05-06 ENCOUNTER — Other Ambulatory Visit: Payer: Self-pay

## 2024-05-06 MED ORDER — METHADONE HCL 5 MG PO TABS
5.0000 mg | ORAL_TABLET | Freq: Three times a day (TID) | ORAL | 0 refills | Status: DC
  Filled 2024-05-06: qty 45, 15d supply, fill #0

## 2024-05-06 MED ORDER — OXYCODONE HCL 15 MG PO TABS
15.0000 mg | ORAL_TABLET | ORAL | 0 refills | Status: AC | PRN
  Filled 2024-05-06: qty 90, 12d supply, fill #0

## 2024-05-21 ENCOUNTER — Other Ambulatory Visit: Payer: Self-pay

## 2024-05-21 MED ORDER — OXYCODONE HCL 20 MG PO TABS
20.0000 mg | ORAL_TABLET | ORAL | 0 refills | Status: AC | PRN
  Filled 2024-05-21: qty 90, 15d supply, fill #0

## 2024-05-21 MED ORDER — METHADONE HCL 5 MG PO TABS
5.0000 mg | ORAL_TABLET | Freq: Three times a day (TID) | ORAL | 0 refills | Status: AC
  Filled 2024-05-21: qty 45, 15d supply, fill #0

## 2024-05-23 ENCOUNTER — Other Ambulatory Visit: Payer: Self-pay

## 2024-05-24 ENCOUNTER — Other Ambulatory Visit: Payer: Self-pay
